# Patient Record
Sex: Female | Born: 1940 | Race: White | Hispanic: No | Smoking: Former smoker
Health system: Southern US, Community
[De-identification: ages and names within clinical notes are randomized; demographics above are authoritative.]

## PROBLEM LIST (undated history)

## (undated) DIAGNOSIS — Z923 Personal history of irradiation: Secondary | ICD-10-CM

## (undated) DIAGNOSIS — M1990 Unspecified osteoarthritis, unspecified site: Secondary | ICD-10-CM

## (undated) DIAGNOSIS — E785 Hyperlipidemia, unspecified: Secondary | ICD-10-CM

## (undated) DIAGNOSIS — K222 Esophageal obstruction: Secondary | ICD-10-CM

## (undated) DIAGNOSIS — L309 Dermatitis, unspecified: Secondary | ICD-10-CM

## (undated) DIAGNOSIS — R011 Cardiac murmur, unspecified: Secondary | ICD-10-CM

## (undated) DIAGNOSIS — N189 Chronic kidney disease, unspecified: Secondary | ICD-10-CM

## (undated) DIAGNOSIS — Z9889 Other specified postprocedural states: Secondary | ICD-10-CM

## (undated) DIAGNOSIS — J939 Pneumothorax, unspecified: Secondary | ICD-10-CM

## (undated) DIAGNOSIS — E039 Hypothyroidism, unspecified: Secondary | ICD-10-CM

## (undated) DIAGNOSIS — D649 Anemia, unspecified: Secondary | ICD-10-CM

## (undated) DIAGNOSIS — Z86718 Personal history of other venous thrombosis and embolism: Secondary | ICD-10-CM

## (undated) DIAGNOSIS — R51 Headache: Secondary | ICD-10-CM

## (undated) DIAGNOSIS — F329 Major depressive disorder, single episode, unspecified: Secondary | ICD-10-CM

## (undated) DIAGNOSIS — C439 Malignant melanoma of skin, unspecified: Secondary | ICD-10-CM

## (undated) DIAGNOSIS — Z9221 Personal history of antineoplastic chemotherapy: Secondary | ICD-10-CM

## (undated) DIAGNOSIS — Z973 Presence of spectacles and contact lenses: Secondary | ICD-10-CM

## (undated) DIAGNOSIS — R519 Headache, unspecified: Secondary | ICD-10-CM

## (undated) DIAGNOSIS — R112 Nausea with vomiting, unspecified: Secondary | ICD-10-CM

## (undated) DIAGNOSIS — K219 Gastro-esophageal reflux disease without esophagitis: Secondary | ICD-10-CM

## (undated) DIAGNOSIS — F32A Depression, unspecified: Secondary | ICD-10-CM

## (undated) DIAGNOSIS — Z853 Personal history of malignant neoplasm of breast: Secondary | ICD-10-CM

## (undated) DIAGNOSIS — I10 Essential (primary) hypertension: Secondary | ICD-10-CM

## (undated) HISTORY — DX: Personal history of malignant neoplasm of breast: Z85.3

## (undated) HISTORY — DX: Unspecified osteoarthritis, unspecified site: M19.90

## (undated) HISTORY — DX: Major depressive disorder, single episode, unspecified: F32.9

## (undated) HISTORY — DX: Hyperlipidemia, unspecified: E78.5

## (undated) HISTORY — PX: BREAST EXCISIONAL BIOPSY: SUR124

## (undated) HISTORY — DX: Gastro-esophageal reflux disease without esophagitis: K21.9

## (undated) HISTORY — DX: Malignant melanoma of skin, unspecified: C43.9

## (undated) HISTORY — DX: Personal history of other venous thrombosis and embolism: Z86.718

## (undated) HISTORY — DX: Dermatitis, unspecified: L30.9

## (undated) HISTORY — PX: COLONOSCOPY: SHX174

## (undated) HISTORY — DX: Hypothyroidism, unspecified: E03.9

## (undated) HISTORY — DX: Essential (primary) hypertension: I10

## (undated) HISTORY — DX: Depression, unspecified: F32.A

## (undated) HISTORY — DX: Esophageal obstruction: K22.2

---
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
## 1965-03-11 HISTORY — PX: APPENDECTOMY: SHX54

## 1975-03-12 HISTORY — PX: BREAST SURGERY: SHX581

## 1976-03-11 HISTORY — PX: LIPOMA EXCISION: SHX5283

## 1990-03-11 HISTORY — PX: TOTAL ABDOMINAL HYSTERECTOMY: SHX209

## 1994-08-06 ENCOUNTER — Encounter: Payer: Self-pay | Admitting: Internal Medicine

## 1996-03-11 HISTORY — PX: MASTECTOMY: SHX3

## 2002-03-11 HISTORY — PX: MELANOMA EXCISION: SHX5266

## 2003-02-14 ENCOUNTER — Other Ambulatory Visit: Admission: RE | Admit: 2003-02-14 | Discharge: 2003-02-14 | Payer: Self-pay | Admitting: Obstetrics and Gynecology

## 2003-03-12 HISTORY — PX: MOHS SURGERY: SUR867

## 2003-08-31 ENCOUNTER — Encounter: Admission: RE | Admit: 2003-08-31 | Discharge: 2003-08-31 | Payer: Self-pay | Admitting: Oncology

## 2004-01-11 ENCOUNTER — Ambulatory Visit: Payer: Self-pay | Admitting: Internal Medicine

## 2004-02-16 ENCOUNTER — Ambulatory Visit: Payer: Self-pay | Admitting: Internal Medicine

## 2004-02-23 ENCOUNTER — Ambulatory Visit: Payer: Self-pay | Admitting: Internal Medicine

## 2004-07-02 ENCOUNTER — Ambulatory Visit: Payer: Self-pay | Admitting: Oncology

## 2004-08-23 ENCOUNTER — Ambulatory Visit: Payer: Self-pay | Admitting: Internal Medicine

## 2004-08-30 ENCOUNTER — Ambulatory Visit: Payer: Self-pay | Admitting: Internal Medicine

## 2004-08-31 ENCOUNTER — Encounter: Admission: RE | Admit: 2004-08-31 | Discharge: 2004-08-31 | Payer: Self-pay | Admitting: Internal Medicine

## 2005-02-14 ENCOUNTER — Encounter: Admission: RE | Admit: 2005-02-14 | Discharge: 2005-02-14 | Payer: Self-pay | Admitting: Internal Medicine

## 2005-02-14 ENCOUNTER — Ambulatory Visit: Payer: Self-pay | Admitting: Internal Medicine

## 2005-04-25 ENCOUNTER — Other Ambulatory Visit: Admission: RE | Admit: 2005-04-25 | Discharge: 2005-04-25 | Payer: Self-pay | Admitting: Obstetrics and Gynecology

## 2005-08-09 ENCOUNTER — Ambulatory Visit: Payer: Self-pay | Admitting: Internal Medicine

## 2005-09-05 ENCOUNTER — Encounter: Admission: RE | Admit: 2005-09-05 | Discharge: 2005-09-05 | Payer: Self-pay | Admitting: Internal Medicine

## 2006-01-10 ENCOUNTER — Ambulatory Visit: Payer: Self-pay | Admitting: Internal Medicine

## 2006-02-06 ENCOUNTER — Ambulatory Visit: Payer: Self-pay | Admitting: Internal Medicine

## 2006-02-06 DIAGNOSIS — E785 Hyperlipidemia, unspecified: Secondary | ICD-10-CM | POA: Insufficient documentation

## 2006-02-06 DIAGNOSIS — E039 Hypothyroidism, unspecified: Secondary | ICD-10-CM

## 2006-02-06 DIAGNOSIS — F3289 Other specified depressive episodes: Secondary | ICD-10-CM | POA: Insufficient documentation

## 2006-02-06 DIAGNOSIS — I10 Essential (primary) hypertension: Secondary | ICD-10-CM

## 2006-02-06 DIAGNOSIS — Z853 Personal history of malignant neoplasm of breast: Secondary | ICD-10-CM

## 2006-02-06 DIAGNOSIS — F329 Major depressive disorder, single episode, unspecified: Secondary | ICD-10-CM

## 2006-02-06 DIAGNOSIS — J309 Allergic rhinitis, unspecified: Secondary | ICD-10-CM | POA: Insufficient documentation

## 2006-02-06 LAB — CONVERTED CEMR LAB
ALT: 19 units/L (ref 0–40)
AST: 23 units/L (ref 0–37)
Albumin: 4.5 g/dL (ref 3.5–5.2)
Alkaline Phosphatase: 77 units/L (ref 39–117)
BUN: 18 mg/dL (ref 6–23)
Bilirubin, Direct: 0.1 mg/dL (ref 0.0–0.3)
CO2: 32 meq/L (ref 19–32)
Calcium: 9.9 mg/dL (ref 8.4–10.5)
Chloride: 100 meq/L (ref 96–112)
Chol/HDL Ratio, serum: 3.8
Cholesterol, target level: 200 mg/dL
Cholesterol: 172 mg/dL (ref 0–200)
Creatinine, Ser: 0.9 mg/dL (ref 0.4–1.2)
GFR calc non Af Amer: 66 mL/min
Glomerular Filtration Rate, Af Am: 80 mL/min/{1.73_m2}
Glucose, Bld: 88 mg/dL (ref 70–99)
HDL goal, serum: 40 mg/dL
HDL: 45.8 mg/dL (ref >39.0)
LDL Cholesterol: 107 mg/dL — ABNORMAL HIGH (ref 0–99)
LDL Goal: 130 mg/dL
Potassium: 4.3 meq/L (ref 3.5–5.1)
Sodium: 139 meq/L (ref 135–145)
TSH: 3.57 microintl units/mL (ref 0.35–5.50)
Total Bilirubin: 0.8 mg/dL (ref 0.3–1.2)
Total Protein: 7.7 g/dL (ref 6.0–8.3)
Triglyceride fasting, serum: 97 mg/dL (ref 0–149)
VLDL: 19 mg/dL (ref 0–40)

## 2006-02-10 ENCOUNTER — Encounter: Payer: Self-pay | Admitting: Internal Medicine

## 2006-03-25 ENCOUNTER — Ambulatory Visit: Payer: Self-pay | Admitting: Internal Medicine

## 2006-07-01 ENCOUNTER — Ambulatory Visit: Payer: Self-pay | Admitting: Internal Medicine

## 2006-07-01 LAB — CONVERTED CEMR LAB
ALT: 20 units/L (ref 0–40)
AST: 27 units/L (ref 0–37)
Albumin: 4.2 g/dL (ref 3.5–5.2)
Alkaline Phosphatase: 67 units/L (ref 39–117)
Bilirubin, Direct: 0.1 mg/dL (ref 0.0–0.3)
TSH: 3.36 microintl units/mL (ref 0.35–5.50)
Total Bilirubin: 0.7 mg/dL (ref 0.3–1.2)
Total Protein: 7 g/dL (ref 6.0–8.3)

## 2006-08-27 ENCOUNTER — Encounter: Payer: Self-pay | Admitting: Internal Medicine

## 2006-08-27 ENCOUNTER — Encounter: Admission: RE | Admit: 2006-08-27 | Discharge: 2006-08-27 | Payer: Self-pay | Admitting: Internal Medicine

## 2006-08-28 ENCOUNTER — Ambulatory Visit: Payer: Self-pay | Admitting: Family Medicine

## 2006-08-28 ENCOUNTER — Encounter: Payer: Self-pay | Admitting: Internal Medicine

## 2006-09-04 ENCOUNTER — Encounter: Admission: RE | Admit: 2006-09-04 | Discharge: 2006-09-04 | Payer: Self-pay | Admitting: Obstetrics and Gynecology

## 2006-10-06 ENCOUNTER — Telehealth (INDEPENDENT_AMBULATORY_CARE_PROVIDER_SITE_OTHER): Payer: Self-pay | Admitting: *Deleted

## 2006-10-28 ENCOUNTER — Telehealth: Payer: Self-pay | Admitting: Internal Medicine

## 2006-12-30 ENCOUNTER — Ambulatory Visit: Payer: Self-pay | Admitting: Internal Medicine

## 2006-12-30 DIAGNOSIS — Z86718 Personal history of other venous thrombosis and embolism: Secondary | ICD-10-CM

## 2006-12-31 LAB — CONVERTED CEMR LAB
ALT: 16 units/L (ref 0–35)
AST: 27 units/L (ref 0–37)
Albumin: 4.3 g/dL (ref 3.5–5.2)
Alkaline Phosphatase: 68 units/L (ref 39–117)
BUN: 15 mg/dL (ref 6–23)
Basophils Absolute: 0 10*3/uL (ref 0.0–0.1)
Basophils Relative: 0.8 % (ref 0.0–1.0)
Bilirubin, Direct: 0.1 mg/dL (ref 0.0–0.3)
CO2: 34 meq/L — ABNORMAL HIGH (ref 19–32)
Calcium: 9.7 mg/dL (ref 8.4–10.5)
Chloride: 101 meq/L (ref 96–112)
Cholesterol: 189 mg/dL (ref 0–200)
Creatinine, Ser: 0.8 mg/dL (ref 0.4–1.2)
Eosinophils Absolute: 0.1 10*3/uL (ref 0.0–0.6)
Eosinophils Relative: 2.2 % (ref 0.0–5.0)
GFR calc Af Amer: 92 mL/min
GFR calc non Af Amer: 76 mL/min
Glucose, Bld: 73 mg/dL (ref 70–99)
HCT: 37.2 % (ref 36.0–46.0)
HDL: 37.3 mg/dL — ABNORMAL LOW (ref >39.0)
Hemoglobin: 13.3 g/dL (ref 12.0–15.0)
LDL Cholesterol: 117 mg/dL — ABNORMAL HIGH (ref 0–99)
Lymphocytes Relative: 24.6 % (ref 12.0–46.0)
MCHC: 35.7 g/dL (ref 30.0–36.0)
MCV: 86.8 fL (ref 78.0–100.0)
Monocytes Absolute: 0.5 10*3/uL (ref 0.2–0.7)
Monocytes Relative: 9.3 % (ref 3.0–11.0)
Neutro Abs: 3.2 10*3/uL (ref 1.4–7.7)
Neutrophils Relative %: 63.1 % (ref 43.0–77.0)
Platelets: 189 10*3/uL (ref 150–400)
Potassium: 4.4 meq/L (ref 3.5–5.1)
RBC: 4.28 M/uL (ref 3.87–5.11)
RDW: 12.5 % (ref 11.5–14.6)
Sodium: 139 meq/L (ref 135–145)
TSH: 3.61 microintl units/mL (ref 0.35–5.50)
Total Bilirubin: 0.8 mg/dL (ref 0.3–1.2)
Total CHOL/HDL Ratio: 5.1
Total Protein: 7.2 g/dL (ref 6.0–8.3)
Triglycerides: 176 mg/dL — ABNORMAL HIGH (ref 0–149)
VLDL: 35 mg/dL (ref 0–40)
WBC: 5.1 10*3/uL (ref 4.5–10.5)

## 2007-04-16 ENCOUNTER — Telehealth: Payer: Self-pay | Admitting: Internal Medicine

## 2007-06-25 ENCOUNTER — Ambulatory Visit: Payer: Self-pay | Admitting: Internal Medicine

## 2007-06-26 LAB — CONVERTED CEMR LAB
ALT: 20 units/L (ref 0–35)
AST: 29 units/L (ref 0–37)
Albumin: 4.5 g/dL (ref 3.5–5.2)
Alkaline Phosphatase: 64 units/L (ref 39–117)
Bilirubin, Direct: 0.1 mg/dL (ref 0.0–0.3)
CO2: 34 meq/L — ABNORMAL HIGH (ref 19–32)
Cholesterol: 190 mg/dL (ref 0–200)
Creatinine, Ser: 0.7 mg/dL (ref 0.4–1.2)
GFR calc Af Amer: 107 mL/min
GFR calc non Af Amer: 88 mL/min
Glucose, Bld: 88 mg/dL (ref 70–99)
LDL Cholesterol: 121 mg/dL — ABNORMAL HIGH (ref 0–99)
TSH: 2.71 microintl units/mL (ref 0.35–5.50)
Total Bilirubin: 1 mg/dL (ref 0.3–1.2)
Total CHOL/HDL Ratio: 4.6
Total Protein: 7.4 g/dL (ref 6.0–8.3)
VLDL: 28 mg/dL (ref 0–40)

## 2007-09-17 ENCOUNTER — Encounter: Admission: RE | Admit: 2007-09-17 | Discharge: 2007-09-17 | Payer: Self-pay | Admitting: Obstetrics and Gynecology

## 2007-11-02 ENCOUNTER — Telehealth: Payer: Self-pay | Admitting: Internal Medicine

## 2007-11-09 ENCOUNTER — Telehealth: Payer: Self-pay | Admitting: Internal Medicine

## 2007-11-24 ENCOUNTER — Ambulatory Visit: Payer: Self-pay | Admitting: Internal Medicine

## 2007-12-10 ENCOUNTER — Ambulatory Visit: Payer: Self-pay | Admitting: Internal Medicine

## 2007-12-14 LAB — CONVERTED CEMR LAB
ALT: 18 units/L (ref 0–35)
Alkaline Phosphatase: 65 units/L (ref 39–117)
Calcium: 9.8 mg/dL (ref 8.4–10.5)
Chloride: 100 meq/L (ref 96–112)
Creatinine, Ser: 0.8 mg/dL (ref 0.4–1.2)
HDL: 37.4 mg/dL — ABNORMAL LOW (ref >39.0)
Potassium: 4.7 meq/L (ref 3.5–5.1)
Total Bilirubin: 0.8 mg/dL (ref 0.3–1.2)
Total Protein: 7.5 g/dL (ref 6.0–8.3)
Triglycerides: 190 mg/dL — ABNORMAL HIGH (ref 0–149)

## 2007-12-31 ENCOUNTER — Telehealth: Payer: Self-pay | Admitting: Internal Medicine

## 2008-02-01 ENCOUNTER — Ambulatory Visit: Payer: Self-pay | Admitting: Internal Medicine

## 2008-02-01 LAB — CONVERTED CEMR LAB
ALT: 18 units/L (ref 0–35)
AST: 26 units/L (ref 0–37)
Alkaline Phosphatase: 71 units/L (ref 39–117)
Bilirubin, Direct: 0.1 mg/dL (ref 0.0–0.3)
Cholesterol: 241 mg/dL (ref 0–200)
Total Bilirubin: 1 mg/dL (ref 0.3–1.2)
Total Protein: 7.7 g/dL (ref 6.0–8.3)
VLDL: 36 mg/dL (ref 0–40)

## 2008-02-10 ENCOUNTER — Ambulatory Visit: Payer: Self-pay | Admitting: Internal Medicine

## 2008-09-08 ENCOUNTER — Ambulatory Visit: Payer: Self-pay | Admitting: Internal Medicine

## 2008-09-08 LAB — CONVERTED CEMR LAB
ALT: 20 units/L (ref 0–35)
AST: 26 units/L (ref 0–37)
Albumin: 4.1 g/dL (ref 3.5–5.2)
Alkaline Phosphatase: 65 units/L (ref 39–117)
Bilirubin, Direct: 0 mg/dL (ref 0.0–0.3)
Direct LDL: 147.8 mg/dL
TSH: 5.6 microintl units/mL — ABNORMAL HIGH (ref 0.35–5.50)
Total Bilirubin: 1 mg/dL (ref 0.3–1.2)
Total Protein: 7.5 g/dL (ref 6.0–8.3)
VLDL: 28.6 mg/dL (ref 0.0–40.0)

## 2008-09-15 ENCOUNTER — Ambulatory Visit: Payer: Self-pay | Admitting: Internal Medicine

## 2008-09-26 ENCOUNTER — Encounter: Payer: Self-pay | Admitting: Internal Medicine

## 2008-09-26 ENCOUNTER — Ambulatory Visit: Payer: Self-pay | Admitting: Internal Medicine

## 2008-10-19 ENCOUNTER — Ambulatory Visit: Payer: Self-pay | Admitting: Internal Medicine

## 2008-10-20 ENCOUNTER — Encounter: Admission: RE | Admit: 2008-10-20 | Discharge: 2008-10-20 | Payer: Self-pay | Admitting: Internal Medicine

## 2008-10-25 ENCOUNTER — Telehealth: Payer: Self-pay | Admitting: Internal Medicine

## 2008-10-31 ENCOUNTER — Ambulatory Visit: Payer: Self-pay | Admitting: Internal Medicine

## 2008-11-01 LAB — CONVERTED CEMR LAB: IgE (Immunoglobulin E), Serum: <1.5 intl units/mL (ref 0.0–180.0)

## 2008-11-09 ENCOUNTER — Ambulatory Visit: Payer: Self-pay | Admitting: Internal Medicine

## 2008-12-21 ENCOUNTER — Telehealth: Payer: Self-pay | Admitting: Internal Medicine

## 2008-12-21 ENCOUNTER — Ambulatory Visit: Payer: Self-pay | Admitting: Internal Medicine

## 2009-03-06 ENCOUNTER — Encounter: Payer: Self-pay | Admitting: Internal Medicine

## 2009-03-14 ENCOUNTER — Encounter: Payer: Self-pay | Admitting: Family Medicine

## 2009-03-20 ENCOUNTER — Ambulatory Visit: Payer: Self-pay | Admitting: Internal Medicine

## 2009-03-23 LAB — CONVERTED CEMR LAB
Alkaline Phosphatase: 68 units/L (ref 39–117)
BUN: 17 mg/dL (ref 6–23)
Cholesterol: 198 mg/dL (ref 0–200)
Creatinine, Ser: 0.9 mg/dL (ref 0.4–1.2)
GFR calc non Af Amer: 65.72 mL/min (ref >60)
HDL: 41.1 mg/dL (ref >39.00)
LDL Cholesterol: 122 mg/dL — ABNORMAL HIGH (ref 0–99)
TSH: 0.97 microintl units/mL (ref 0.35–5.50)
Triglycerides: 177 mg/dL — ABNORMAL HIGH (ref 0.0–149.0)
VLDL: 35.4 mg/dL (ref 0.0–40.0)

## 2009-07-24 ENCOUNTER — Encounter: Payer: Self-pay | Admitting: Internal Medicine

## 2009-07-26 ENCOUNTER — Encounter: Payer: Self-pay | Admitting: Internal Medicine

## 2009-07-27 ENCOUNTER — Encounter: Payer: Self-pay | Admitting: Internal Medicine

## 2009-09-18 ENCOUNTER — Ambulatory Visit: Payer: Self-pay | Admitting: Internal Medicine

## 2009-09-19 LAB — CONVERTED CEMR LAB
ALT: 17 units/L (ref 0–35)
AST: 26 units/L (ref 0–37)
Albumin: 4.5 g/dL (ref 3.5–5.2)
Alkaline Phosphatase: 74 units/L (ref 39–117)
BUN: 20 mg/dL (ref 6–23)
Basophils Absolute: 0 10*3/uL (ref 0.0–0.1)
Bilirubin, Direct: 0.1 mg/dL (ref 0.0–0.3)
CO2: 33 meq/L — ABNORMAL HIGH (ref 19–32)
Chloride: 103 meq/L (ref 96–112)
Cholesterol: 226 mg/dL — ABNORMAL HIGH (ref 0–200)
Creatinine, Ser: 0.8 mg/dL (ref 0.4–1.2)
Eosinophils Absolute: 0.1 10*3/uL (ref 0.0–0.7)
Eosinophils Relative: 2.6 % (ref 0.0–5.0)
GFR calc non Af Amer: 74.11 mL/min (ref >60)
HCT: 37.4 % (ref 36.0–46.0)
Hemoglobin: 13.1 g/dL (ref 12.0–15.0)
Lymphocytes Relative: 27.3 % (ref 12.0–46.0)
Lymphs Abs: 1.4 10*3/uL (ref 0.7–4.0)
Monocytes Absolute: 0.4 10*3/uL (ref 0.1–1.0)
Potassium: 4.8 meq/L (ref 3.5–5.1)
RBC: 4.2 M/uL (ref 3.87–5.11)
Total CHOL/HDL Ratio: 5
Total Protein: 7.8 g/dL (ref 6.0–8.3)

## 2009-10-09 ENCOUNTER — Encounter: Admission: RE | Admit: 2009-10-09 | Discharge: 2009-10-30 | Payer: Self-pay | Admitting: Internal Medicine

## 2009-10-25 ENCOUNTER — Encounter: Payer: Self-pay | Admitting: Internal Medicine

## 2009-10-26 ENCOUNTER — Encounter: Admission: RE | Admit: 2009-10-26 | Discharge: 2009-10-26 | Payer: Self-pay | Admitting: Obstetrics and Gynecology

## 2009-11-03 ENCOUNTER — Encounter: Payer: Self-pay | Admitting: Internal Medicine

## 2009-12-26 ENCOUNTER — Telehealth: Payer: Self-pay | Admitting: *Deleted

## 2010-01-16 ENCOUNTER — Ambulatory Visit: Payer: Self-pay | Admitting: Internal Medicine

## 2010-03-19 ENCOUNTER — Encounter: Payer: Self-pay | Admitting: Internal Medicine

## 2010-03-19 ENCOUNTER — Ambulatory Visit
Admission: RE | Admit: 2010-03-19 | Discharge: 2010-03-19 | Payer: Self-pay | Source: Home / Self Care | Attending: Internal Medicine | Admitting: Internal Medicine

## 2010-03-19 ENCOUNTER — Other Ambulatory Visit: Payer: Self-pay | Admitting: Internal Medicine

## 2010-03-19 LAB — HEPATIC FUNCTION PANEL
ALT: 17 U/L (ref 0–35)
AST: 24 U/L (ref 0–37)
Albumin: 4.2 g/dL (ref 3.5–5.2)
Alkaline Phosphatase: 73 U/L (ref 39–117)
Bilirubin, Direct: 0.1 mg/dL (ref 0.0–0.3)
Total Bilirubin: 0.7 mg/dL (ref 0.3–1.2)
Total Protein: 7.4 g/dL (ref 6.0–8.3)

## 2010-03-19 LAB — BASIC METABOLIC PANEL
BUN: 19 mg/dL (ref 6–23)
CO2: 29 mEq/L (ref 19–32)
Calcium: 10.2 mg/dL (ref 8.4–10.5)
Chloride: 104 mEq/L (ref 96–112)
Creatinine, Ser: 0.8 mg/dL (ref 0.4–1.2)
GFR: 77.3 mL/min (ref >60.00)
Glucose, Bld: 76 mg/dL (ref 70–99)
Potassium: 4.7 mEq/L (ref 3.5–5.1)
Sodium: 141 mEq/L (ref 135–145)

## 2010-03-19 LAB — LIPID PANEL
Cholesterol: 219 mg/dL — ABNORMAL HIGH (ref 0–200)
HDL: 43.9 mg/dL (ref >39.00)
Total CHOL/HDL Ratio: 5
Triglycerides: 223 mg/dL — ABNORMAL HIGH (ref 0.0–149.0)
VLDL: 44.6 mg/dL — ABNORMAL HIGH (ref 0.0–40.0)

## 2010-03-19 LAB — CBC WITH DIFFERENTIAL/PLATELET
Basophils Absolute: 0 10*3/uL (ref 0.0–0.1)
Basophils Relative: 0.6 % (ref 0.0–3.0)
Eosinophils Absolute: 0.1 10*3/uL (ref 0.0–0.7)
Eosinophils Relative: 2 % (ref 0.0–5.0)
HCT: 36.8 % (ref 36.0–46.0)
Hemoglobin: 12.8 g/dL (ref 12.0–15.0)
Lymphocytes Relative: 26.1 % (ref 12.0–46.0)
Lymphs Abs: 1.3 10*3/uL (ref 0.7–4.0)
MCHC: 34.8 g/dL (ref 30.0–36.0)
MCV: 88.9 fl (ref 78.0–100.0)
Monocytes Absolute: 0.4 10*3/uL (ref 0.1–1.0)
Monocytes Relative: 8.1 % (ref 3.0–12.0)
Neutro Abs: 3.1 10*3/uL (ref 1.4–7.7)
Neutrophils Relative %: 63.2 % (ref 43.0–77.0)
Platelets: 195 10*3/uL (ref 150.0–400.0)
RBC: 4.13 Mil/uL (ref 3.87–5.11)
RDW: 13.2 % (ref 11.5–14.6)
WBC: 4.9 10*3/uL (ref 4.5–10.5)

## 2010-03-19 LAB — TSH: TSH: 0.96 u[IU]/mL (ref 0.35–5.50)

## 2010-03-19 LAB — LDL CHOLESTEROL, DIRECT: Direct LDL: 144.6 mg/dL

## 2010-03-29 ENCOUNTER — Telehealth: Payer: Self-pay | Admitting: Internal Medicine

## 2010-04-10 NOTE — Miscellaneous (Signed)
Summary: Discharge Summary for PT [facility]  Discharge Summary for PT Services/[facility]   Imported By: Maryln Gottron 01/24/2010 12:52:38  _____________________________________________________________________  External Attachment:    Type:   Image     Comment:   External Document

## 2010-04-10 NOTE — Letter (Signed)
Summary: [facility]   Imported By: Maryln Gottron 09/19/2009 14:58:37  _____________________________________________________________________  External Attachment:    Type:   Image     Comment:   External Document

## 2010-04-10 NOTE — Letter (Signed)
Summary: [facility]   Imported By: Maryln Gottron 09/19/2009 15:01:55  _____________________________________________________________________  External Attachment:    Type:   Image     Comment:   External Document

## 2010-04-10 NOTE — Letter (Signed)
Summary: [facility]   Imported By: Maryln Gottron 09/19/2009 15:00:06  _____________________________________________________________________  External Attachment:    Type:   Image     Comment:   External Document

## 2010-04-10 NOTE — Assessment & Plan Note (Signed)
Summary: 6 MONTH ROV/NJR   Vital Signs:  Patient profile:   70 year old female Weight:      147 pounds Temp:     97.9 degrees F Pulse rate:   72 / minute Resp:     12 per minute BP sitting:   112 / 52  (left arm)  Vitals Entered By: Gladis Riffle, RN (March 20, 2009 10:15 AM)   Primary Care Provider:  Tryton Bodi   History of Present Illness:  Follow-Up Visit      This is a 70 year old woman who presents for Follow-up visit.  The patient denies chest pain, palpitations, dizziness, syncope, SOB, DOE, PND, and orthopnea.  Since the last visit the patient notes no new problems or concerns.  The patient reports taking meds as prescribed.  When questioned about possible medication side effects, the patient notes none.    All other systems reviewed and were negative -- has had minimal episodes of vertigo---sees ENT she is going to resume exercise.   Preventive Screening-Counseling & Management  Alcohol-Tobacco     Smoking Status: quit  Current Problems (verified): 1)  ? of Allergy, Food  (ICD-693.1) 2)  Special Screening For Malignant Neoplasms Colon  (ICD-V76.51) 3)  Dvt, Hx of  (ICD-V12.51) 4)  Allergic Rhinitis  (ICD-477.9) 5)  Breast Cancer, Hx of  (ICD-V10.3) 6)  Hypothyroidism  (ICD-244.9) 7)  Hypertension  (ICD-401.9) 8)  Hyperlipidemia  (ICD-272.4) 9)  Depression  (ICD-311) 10)  Osteoporosis  (ICD-733.00)  Current Medications (verified): 1)  Atrovent 0.03 % Soln (Ipratropium Bromide) .... Spray 1 Spray Into Both Nostrils Twice A  Day 2)  Effexor Xr 37.5 Mg  Cp24 (Venlafaxine Hcl) .[name].. 1 By Mouth Once Daily 3)  Nadolol 20 Mg  Tabs (Nadolol) .... 1/2 Daily 4)  Nexium 40 Mg Cpdr (Esomeprazole Magnesium) .... Take 1 Capsule By Mouth Once A Day 5)  Levothyroxine Sodium 75 Mcg  Tabs (Levothyroxine Sodium) .[name].. 1 By Mouth Daily 6)  Caltrate 600+d 600-400 Mg-Unit Tabs (Calcium Carbonate-Vitamin D) .... Two Times A Day 7)  Stress Formula  Tabs (B Complex-C-Folic Acid) .... Once  Daily 8)  Lipitor 10 Mg Tabs (Atorvastatin Calcium) .... Take One Tablet Every Other Bedtime 9)  Ibuprofen 200 Mg Tabs (Ibuprofen) .... Two Once Daily 10)  Claritin 10 Mg Tabs (Loratadine) .... Once Daily  Allergies: 1)  ! * Flu Vaccination 2)  ! Pcn 3)  ! * Cat Gut 4)  ! Niacin 5)  ! * Eggs  Comments:  Nurse/Medical Assistant: 6 month rov, pt given all RX for a year--c/o vertigo relieved by claritin  The patient's medications and allergies were reviewed with the patient and were updated in the Medication and Allergy Lists. Gladis Riffle, RN (March 20, 2009 10:19 AM)  Past History:  Past Medical History: Last updated: 10/19/2008 vertigo L ear hearing loss tinnitis Osteoporosis dvt after child birth Depression Hyperlipidemia Hypertension Hypothyroidism Breast cancer, hx of Melanoma DVT, hx of Hx of Eczema Hx of Allergic Rhinitis- skin test 1996 scanned  Past Surgical History: Last updated: 10/19/2008 Appendectomy Mastectomy L eye vitreous tear Facial melanoma Basal cell carcinoma Total Abdominal Hysterectomy Tonsillectomy  Family History: Last updated: 10/19/2008 Father- heart disease Mother- Cancer  Social History: Last updated: 10/19/2008 Retired Widowed, children Former Smoker- 1 ppd x 5 years Alcohol use-yes  Risk Factors: Smoking Status: quit (03/20/2009)  Physical Exam  General:  Well-developed,well-nourished,in no acute distress; alert,appropriate and cooperative throughout examination Head:  normocephalic and atraumatic.  Eyes:  pupils equal and pupils round.   Ears:  R ear normal and L ear normal.   Nose:  no external deformity and no external erythema.   Neck:  No deformities, masses, or tenderness noted. Lungs:  normal respiratory effort and no intercostal retractions.   Heart:  normal rate and regular rhythm.   Abdomen:  Bowel sounds positive,abdomen soft and non-tender without masses, organomegaly or hernias noted. Msk:  No  deformity or scoliosis noted of thoracic or lumbar spine.   Pulses:  R radial normal and L radial normal.   Neurologic:  cranial nerves II-XII intact and gait normal.     Impression & Recommendations:  Problem # 1:  BREAST CANCER, HX OF (ICD-V10.3) no recurrence  Problem # 2:  HYPOTHYROIDISM (ICD-244.9) tolerating meds Her updated medication list for this problem includes:    Levothyroxine Sodium 75 Mcg Tabs (Levothyroxine sodium) .[name]... 1 by mouth daily  Labs Reviewed: TSH: 5.60 (09/08/2008)    Chol: 213 (09/08/2008)   HDL: 42.30 (09/08/2008)   LDL: DEL (02/01/2008)   TG: 143.0 (09/08/2008)  Orders: Venipuncture (16109) TLB-TSH (Thyroid Stimulating Hormone) (84443-TSH)  Problem # 3:  HYPERTENSION (ICD-401.9) controlled Her updated medication list for this problem includes:    Nadolol 20 Mg Tabs (Nadolol) .[name]... 1/2 daily  BP today: 112/52 Prior BP: 120/80 (10/19/2008)  Prior 10 Yr Risk Heart Disease: 15 % (02/10/2006)  Labs Reviewed: K+: 4.7 (12/10/2007) Creat: : 0.8 (12/10/2007)   Chol: 213 (09/08/2008)   HDL: 42.30 (09/08/2008)   LDL: DEL (02/01/2008)   TG: 143.0 (09/08/2008)  Orders: Venipuncture (60454) TLB-BMP (Basic Metabolic Panel-BMET) (80048-METABOL)  Problem # 4:  HYPERLIPIDEMIA (ICD-272.4)  previously controlled Her updated medication list for this problem includes:    Lipitor 10 Mg Tabs (Atorvastatin calcium) .[name]... Take one tablet every other bedtime  Labs Reviewed: SGOT: 26 (09/08/2008)   SGPT: 20 (09/08/2008)  Lipid Goals: Chol Goal: 200 (02/06/2006)   HDL Goal: 40 (02/06/2006)   LDL Goal: 130 (02/06/2006)   TG Goal: 150 (02/06/2006)  Prior 10 Yr Risk Heart Disease: 15 % (02/10/2006)   HDL:42.30 (09/08/2008), 43.2 (02/01/2008)  LDL:DEL (02/01/2008), DEL (12/10/2007)  Chol:213 (09/08/2008), 241 (02/01/2008)  Trig:143.0 (09/08/2008), 181 (02/01/2008) reviewed dexa scan will ask for previous scans and make a decision about treatment  Complete  Medication List: 1)  Atrovent 0.03 % Soln (Ipratropium bromide) .... Spray 1 spray into both nostrils twice a  day 2)  Effexor Xr 37.5 Mg Cp24 (Venlafaxine hcl) .[name].. 1 by mouth once daily 3)  Nadolol 20 Mg Tabs (Nadolol) .... 1/2 daily 4)  Nexium 40 Mg Cpdr (Esomeprazole magnesium) .... Take 1 capsule by mouth once a day 5)  Levothyroxine Sodium 75 Mcg Tabs (Levothyroxine sodium) .[name].. 1 by mouth daily 6)  Caltrate 600+d 600-400 Mg-unit Tabs (Calcium carbonate-vitamin d) .... Two times a day 7)  Stress Formula Tabs (B complex-c-folic acid) .... Once daily 8)  Lipitor 10 Mg Tabs (Atorvastatin calcium) .... Take one tablet every other bedtime 9)  Ibuprofen 200 Mg Tabs (Ibuprofen) .... Two once daily 10)  Claritin 10 Mg Tabs (Loratadine) .... Once daily  Other Orders: TLB-Lipid Panel (80061-LIPID) TLB-Hepatic/Liver Function Pnl (80076-HEPATIC) Prescriptions: LIPITOR 10 MG TABS (ATORVASTATIN CALCIUM) Take one tablet every other bedtime  #45 x 3   Entered by:   Gladis Riffle, RN   Authorized by:   Birdie Sons MD   Signed by:   Gladis Riffle, RN on 03/20/2009   Method used:   Print then  Give to Patient   RxID:   [id number] EFFEXOR XR 37.5 MG  CP24 (VENLAFAXINE HCL) 1 by mouth once daily  #90 x 3   Entered by:   Gladis Riffle, RN   Authorized by:   Birdie Sons MD   Signed by:   Gladis Riffle, RN on 03/20/2009   Method used:   Print then Give to Patient   RxID:   [id number] ATROVENT 0.03 % SOLN (IPRATROPIUM BROMIDE) Spray 1 spray into both nostrils twice a  day  #3 x 3   Entered by:   Gladis Riffle, RN   Authorized by:   Birdie Sons MD   Signed by:   Gladis Riffle, RN on 03/20/2009   Method used:   Print then Give to Patient   RxID:   [id number] NEXIUM 40 MG CPDR (ESOMEPRAZOLE MAGNESIUM) Take 1 capsule by mouth once a day  #90 x 3   Entered by:   Gladis Riffle, RN   Authorized by:   Birdie Sons MD   Signed by:   Gladis Riffle, RN on 03/20/2009   Method used:   Print then Give to Patient    RxID:   [id number] LEVOTHYROXINE SODIUM 75 MCG  TABS (LEVOTHYROXINE SODIUM) 1 by mouth daily  #90 x 3   Entered by:   Gladis Riffle, RN   Authorized by:   Birdie Sons MD   Signed by:   Gladis Riffle, RN on 03/20/2009   Method used:   Print then Give to Patient   RxID:   [id number] NADOLOL 20 MG  TABS (NADOLOL) 1/2 daily  #45 x 3   Entered by:   Gladis Riffle, RN   Authorized by:   Birdie Sons MD   Signed by:   Gladis Riffle, RN on 03/20/2009   Method used:   Print then Give to Patient   RxID:   [id number]

## 2010-04-10 NOTE — Medication Information (Signed)
Summary: Order for Mastectomy Supplies/[facility]   Order for Mastectomy Supplies/[facility]   Imported By: Maryln Gottron 03/15/2009 14:45:38  _____________________________________________________________________  External Attachment:    Type:   Image     Comment:   External Document

## 2010-04-10 NOTE — Assessment & Plan Note (Signed)
Summary: 6 month rov/njr   Vital Signs:  Patient profile:   70 year old female Height:      66.5 inches (168.91 cm) Weight:      146 pounds (66.36 kg) BMI:     23.30 Temp:     98.2 degrees F (36.78 degrees C) oral Pulse rate:   72 / minute BP sitting:   118 / 72  (left arm) Cuff size:   regular  Vitals Entered By: Josph Macho RMA (September 18, 2009 9:45 AM) CC: 6 month follow up/ CF Is Patient Diabetic? No   Primary Care Provider:  Biance Moncrief  CC:  6 month follow up/ CF.  History of Present Illness:  Follow-Up Visit      This is a 70 year old woman who presents for Follow-up visit.  The patient denies chest pain and palpitations.  Since the last visit the patient notes no new problems or concerns.  The patient reports taking meds as prescribed.  When questioned about possible medication side effects, the patient notes none.   she has seen a derrmatologist about contact dermatologist---she states allergic to lanolin---reviewed note of 07/24/09  Vertigo---intermittent sxs. has seen ENT in the past. she describes several year hx of intermittent room spinning. Typically exacerbated by lying back or moving head quickly.   she describes frontal sinus pressure---she thinks related to sinus/allergies. She describes chronic rhinorrhea/PNDr---better with atrovent.  All other systems reviewed and were negative   Current Problems (verified): 1)  ? of Allergy, Food  (ICD-693.1) 2)  Dvt, Hx of  (ICD-V12.51) 3)  Allergic Rhinitis  (ICD-477.9) 4)  Breast Cancer, Hx of  (ICD-V10.3) 5)  Hypothyroidism  (ICD-244.9) 6)  Hypertension  (ICD-401.9) 7)  Hyperlipidemia  (ICD-272.4) 8)  Depression  (ICD-311) 9)  Osteoporosis  (ICD-733.00)  Current Medications (verified): 1)  Atrovent 0.03 % Soln (Ipratropium Bromide) .... Spray 1 Spray Into Both Nostrils Twice A  Day 2)  Effexor Xr 37.5 Mg  Cp24 (Venlafaxine Hcl) .[name].. 1 By Mouth Once Daily 3)  Nadolol 20 Mg  Tabs (Nadolol) .... 1/2 Daily 4)  Nexium  40 Mg Cpdr (Esomeprazole Magnesium) .... Take 1 Capsule By Mouth Once A Day 5)  Levothyroxine Sodium 75 Mcg  Tabs (Levothyroxine Sodium) .[name].. 1 By Mouth Daily 6)  Caltrate 600+d 600-400 Mg-Unit Tabs (Calcium Carbonate-Vitamin D) .... Two Times A Day 7)  Stress Formula  Tabs (B Complex-C-Folic Acid) .... Once Daily 8)  Lipitor 10 Mg Tabs (Atorvastatin Calcium) .... Take One Tablet Every Other Bedtime 9)  Ibuprofen 200 Mg Tabs (Ibuprofen) .... Two Once Daily  Allergies (verified): 1)  ! * Flu Vaccination 2)  ! Pcn 3)  ! * Cat Gut 4)  ! Niacin 5)  ! * Eggs  Past History:  Past Medical History: Last updated: 10/19/2008 vertigo L ear hearing loss tinnitis Osteoporosis dvt after child birth Depression Hyperlipidemia Hypertension Hypothyroidism Breast cancer, hx of Melanoma DVT, hx of Hx of Eczema Hx of Allergic Rhinitis- skin test 1996 scanned  Past Surgical History: Last updated: 10/19/2008 Appendectomy Mastectomy L eye vitreous tear Facial melanoma Basal cell carcinoma Total Abdominal Hysterectomy Tonsillectomy  Family History: Last updated: 10/19/2008 Father- heart disease Mother- Cancer  Social History: Last updated: 10/19/2008 Retired Widowed, children Former Smoker- 1 ppd x 5 years Alcohol use-yes  Risk Factors: Smoking Status: quit (03/20/2009)  Physical Exam  General:  alert and well-developed.   Head:  normocephalic and atraumatic.   Eyes:  pupils equal and pupils round.  Ears:  R ear normal and L ear normal.   Nose:  no external deformity and no external erythema.   Neck:  No deformities, masses, or tenderness noted. Chest Wall:  No deformities, masses, or tenderness noted. Lungs:  normal respiratory effort and no intercostal retractions.   Heart:  normal rate and regular rhythm.   Abdomen:  Bowel sounds positive,abdomen soft and non-tender without masses, organomegaly or hernias noted. Msk:  No deformity or scoliosis noted of thoracic or  lumbar spine.   Pulses:  R radial normal and L radial normal.   Neurologic:  cranial nerves II-XII intact and gait normal.   Skin:  turgor normal and color normal.   Cervical Nodes:  no anterior cervical adenopathy and no posterior cervical adenopathy.   Psych:  normally interactive and good eye contact.     Impression & Recommendations:  Problem # 1:  HYPOTHYROIDISM (ICD-244.9)  previously controlled continue current medications  Her updated medication list for this problem includes:    Levothyroxine Sodium 75 Mcg Tabs (Levothyroxine sodium) .[name]... 1 by mouth daily  Labs Reviewed: TSH: 0.97 (03/20/2009)    Chol: 198 (03/20/2009)   HDL: 41.10 (03/20/2009)   LDL: 122 (03/20/2009)   TG: 177.0 (03/20/2009)  Orders: TLB-TSH (Thyroid Stimulating Hormone) (84443-TSH)  Problem # 2:  HYPERTENSION (ICD-401.9)  controlled continue current medications  Her updated medication list for this problem includes:    Nadolol 20 Mg Tabs (Nadolol) .[name]... 1/2 daily  BP today: 118/72 Prior BP: 112/52 (03/20/2009)  Prior 10 Yr Risk Heart Disease: 15 % (02/10/2006)  Labs Reviewed: K+: 4.4 (03/20/2009) Creat: : 0.9 (03/20/2009)   Chol: 198 (03/20/2009)   HDL: 41.10 (03/20/2009)   LDL: 122 (03/20/2009)   TG: 177.0 (03/20/2009)  Orders: Venipuncture (16109) TLB-BMP (Basic Metabolic Panel-BMET) (80048-METABOL)  Problem # 3:  DEPRESSION (ICD-311) doing well continue current medications  Her updated medication list for this problem includes:    Effexor Xr 37.5 Mg Cp24 (Venlafaxine hcl) .[name]... 1 by mouth once daily  Problem # 4:  BREAST CANCER, HX OF (ICD-V10.3)  no known recurrence  Orders: TLB-CBC Platelet - w/Differential (85025-CBCD)  Problem # 5:  VERTIGO (ICD-780.4)  discussed at length---refer vestibular The following medications were removed from the medication list:    Claritin 10 Mg Tabs (Loratadine) ..... Once daily  Orders: Rehabilitation Referral (Rehab)  Complete  Medication List: 1)  Atrovent 0.03 % Soln (Ipratropium bromide) .... Spray 1 spray into both nostrils twice a  day 2)  Effexor Xr 37.5 Mg Cp24 (Venlafaxine hcl) .[name].. 1 by mouth once daily 3)  Nadolol 20 Mg Tabs (Nadolol) .... 1/2 daily 4)  Nexium 40 Mg Cpdr (Esomeprazole magnesium) .... Take 1 capsule by mouth once a day 5)  Levothyroxine Sodium 75 Mcg Tabs (Levothyroxine sodium) .[name].. 1 by mouth daily 6)  Caltrate 600+d 600-400 Mg-unit Tabs (Calcium carbonate-vitamin d) .... Two times a day 7)  Stress Formula Tabs (B complex-c-folic acid) .... Once daily 8)  Lipitor 10 Mg Tabs (Atorvastatin calcium) .... Take one tablet every other bedtime 9)  Ibuprofen 200 Mg Tabs (Ibuprofen) .... Two once daily 10)  Nasonex 50 Mcg/act Susp (Mometasone furoate) .[name].. 1-2 each nostril once daily  Other Orders: TLB-Lipid Panel (80061-LIPID) TLB-Hepatic/Liver Function Pnl (80076-HEPATIC)  Patient Instructions: 1)  Please schedule a follow-up appointment in 6 months.

## 2010-04-10 NOTE — Assessment & Plan Note (Signed)
Summary: flu shot//alp  Nurse Visit   Allergies: 1)  ! * Flu Vaccination 2)  ! Pcn 3)  ! * Cat Gut 4)  ! Niacin 5)  ! * Eggs  Orders Added: 1)  Flu Vaccine 14yrs + MEDICARE PATIENTS [Q2039] 2)  Administration Flu vaccine - MCR [G0008] Flu Vaccine Consent Questions     Do you have a history of severe allergic reactions to this vaccine? no    Any prior history of allergic reactions to egg and/or gelatin? no    Do you have a sensitivity to the preservative Thimersol? no    Do you have a past history of Guillan-Barre Syndrome? no    Do you currently have an acute febrile illness? no    Have you ever had a severe reaction to latex? no    Vaccine information given and explained to patient? yes    Are you currently pregnant? no    Lot Number:[id number]   Exp Date:09/08/2010   Site Given  Left Deltoid IM [email or web address]

## 2010-04-10 NOTE — Progress Notes (Signed)
Summary: Pt req med to be called in for sore throat,cough,runny nose  Phone Note Call from Patient Call back at Home Phone [phone] Call back at [phone] cell   Caller: Patient Summary of Call: Pt called and said that she has sore throat,runny nose, non-productive cough, headaches. Pt has been taking allegra otc for 2 weeks for verigo symptoms and fullness in ears. Pt says she is feeling worse. Req Dr Cato Mulligan call in a  med to [facility] on [address]. Pt wants to know if she should continue the Allegra or not? Pt still has some of the Hydomet syrup and wants to know if that would still be ok to take? Pls call. Initial call taken by: Lucy Antigua,  December 26, 2009 8:52 AM  Follow-up for Phone Call        discontinue Allegra. Start Mucinex DM one p.o. b.i.d. It's okay for her to use the cough syrup as needed. Follow-up by: Birdie Sons MD,  December 26, 2009 3:20 PM  Additional Follow-up for Phone Call Additional follow up Details #1::        Pt called back and has been informed with the instructions noted above.  Additional Follow-up by: Lucy Antigua,  December 26, 2009 4:08 PM

## 2010-04-10 NOTE — Miscellaneous (Signed)
Summary: Initial Summary for PT Services/[facility]  Initial Summary for PT Services/[facility]   Imported By: Maryln Gottron 11/01/2009 15:19:51  _____________________________________________________________________  External Attachment:    Type:   Image     Comment:   External Document

## 2010-04-12 NOTE — Progress Notes (Signed)
Summary: REQUEST FOR RETURN CALL  Phone Note Call from Patient   Caller: Patient   [phone] Summary of Call: Pt wants to speak with Dr / Nurse in ref to recent labwork.... Pt can be reached at [phone].   Initial call taken by: Debbra Riding,  March 29, 2010 11:40 AM  Follow-up for Phone Call        pt is feeling better, no diarrhea and wants to know if she can resume with dairy products and if she can does she do it gradually? Follow-up by: Alfred Levins, CMA,  March 29, 2010 12:31 PM  Additional Follow-up for Phone Call Additional follow up Details #1::        she can try i would adviseOTC lactaid prior to dairy products per package instructions Additional Follow-up by: Birdie Sons MD,  March 30, 2010 6:54 AM    Additional Follow-up for Phone Call Additional follow up Details #2::    pt aware Follow-up by: Alfred Levins, CMA,  March 30, 2010 5:19 PM

## 2010-04-12 NOTE — Assessment & Plan Note (Signed)
Summary: 6 month rov/nrj   Vital Signs:  Patient profile:   70 year old female Weight:      145 pounds Temp:     97.8 degrees F oral Pulse rate:   72 / minute Pulse rhythm:   regular BP sitting:   132 / 74  (left arm) Cuff size:   regular  Vitals Entered By: Alfred Levins, CMA (March 19, 2010 9:58 AM) CC: renew meds   Primary Care Provider:  Advith Martine  CC:  renew meds.  History of Present Illness:  Follow-Up Visit      This is a 70 year old woman who presents for Follow-up visit.  The patient denies chest pain and palpitations.  Since the last visit the patient notes no new problems or concern except has developed diarrhea---she thinks related t to food allergy, she will have diarrhea from daily to every 3-4 days.  The patient reports taking meds as prescribed.  When questioned about possible medication side effects, the patient notes none.   when she has the diarrhea she describes one loose BM.  All other systems reviewed and were negative   Current Problems (verified): 1)  Vertigo  (ICD-780.4) 2)  ? of Allergy, Food  (ICD-693.1) 3)  Dvt, Hx of  (ICD-V12.51) 4)  Allergic Rhinitis  (ICD-477.9) 5)  Breast Cancer, Hx of  (ICD-V10.3) 6)  Hypothyroidism  (ICD-244.9) 7)  Hypertension  (ICD-401.9) 8)  Hyperlipidemia  (ICD-272.4) 9)  Depression  (ICD-311) 10)  Osteoporosis  (ICD-733.00)  Current Medications (verified): 1)  Atrovent 0.03 % Soln (Ipratropium Bromide) .... Spray 1 Spray Into Both Nostrils Twice A  Day 2)  Effexor Xr 37.5 Mg  Cp24 (Venlafaxine Hcl) .[name].. 1 By Mouth Once Daily 3)  Nadolol 20 Mg  Tabs (Nadolol) .... 1/2 Daily 4)  Nexium 40 Mg Cpdr (Esomeprazole Magnesium) .... Take 1 Capsule By Mouth Once A Day 5)  Levothyroxine Sodium 75 Mcg  Tabs (Levothyroxine Sodium) .[name].. 1 By Mouth Daily 6)  Caltrate 600+d 600-400 Mg-Unit Tabs (Calcium Carbonate-Vitamin D) .... Two Times A Day 7)  Stress Formula  Tabs (B Complex-C-Folic Acid) .... Once Daily 8)  Lipitor 10 Mg  Tabs (Atorvastatin Calcium) .... Take One Tablet Every Other Bedtime  Allergies (verified): 1)  ! * Flu Vaccination 2)  ! Pcn 3)  ! * Cat Gut 4)  ! Niacin 5)  ! * Eggs  Past History:  Past Medical History: Last updated: 10/19/2008 vertigo L ear hearing loss tinnitis Osteoporosis dvt after child birth Depression Hyperlipidemia Hypertension Hypothyroidism Breast cancer, hx of Melanoma DVT, hx of Hx of Eczema Hx of Allergic Rhinitis- skin test 1996 scanned  Past Surgical History: Last updated: 10/19/2008 Appendectomy Mastectomy L eye vitreous tear Facial melanoma Basal cell carcinoma Total Abdominal Hysterectomy Tonsillectomy  Family History: Last updated: 10/19/2008 Father- heart disease Mother- Cancer  Social History: Last updated: 10/19/2008 Retired Widowed, children Former Smoker- 1 ppd x 5 years Alcohol use-yes  Risk Factors: Smoking Status: quit (03/20/2009)  Physical Exam  General:   well-developed well-nourished female in no acute distress. HEENT exam atraumatic, normocephalic, nonicteric. Neck supple. Chest clear to auscultation cardiac exam S1-S2 are regular. Abdominal exam active bowel sounds, soft. Extremities no edema. Neurologic exam she is alert with a normal gait.   Impression & Recommendations:  Problem # 1:  DIARRHEA (ICD-787.91)  ? cause reviewed colonoscopy avoid dairy products screen for sprue  Orders: T-Sprue Panel (Celiac Disease Aby Eval) (83516x3/[address])  Problem # 2:  HYPOTHYROIDISM (  ICD-244.9)  check labs today. Her updated medication list for this problem includes:    Levothyroxine Sodium 75 Mcg Tabs (Levothyroxine sodium) .[name]... 1 by mouth daily  Labs Reviewed: TSH: 1.83 (09/18/2009)    Chol: 226 (09/18/2009)   HDL: 42.00 (09/18/2009)   LDL: 122 (03/20/2009)   TG: 189.0 (09/18/2009)  Orders: Venipuncture (13244) TLB-TSH (Thyroid Stimulating Hormone) (84443-TSH)  Problem # 3:  HYPERLIPIDEMIA  (ICD-272.4)  check labs today Her updated medication list for this problem includes:    Lipitor 10 Mg Tabs (Atorvastatin calcium) .[name]... Take one tablet every other bedtime  Labs Reviewed: SGOT: 26 (09/18/2009)   SGPT: 17 (09/18/2009)  Lipid Goals: Chol Goal: 200 (02/06/2006)   HDL Goal: 40 (02/06/2006)   LDL Goal: 130 (02/06/2006)   TG Goal: 150 (02/06/2006)  Prior 10 Yr Risk Heart Disease: 15 % (02/10/2006)   HDL:42.00 (09/18/2009), 41.10 (03/20/2009)  LDL:122 (03/20/2009), DEL (02/01/2008)  Chol:226 (09/18/2009), 198 (03/20/2009)  Trig:189.0 (09/18/2009), 177.0 (03/20/2009)  Orders: TLB-Lipid Panel (80061-LIPID) TLB-Hepatic/Liver Function Pnl (80076-HEPATIC)  Problem # 4:  HYPERTENSION (ICD-401.9)  controlled. Her updated medication list for this problem includes:    Nadolol 20 Mg Tabs (Nadolol) .[name]... 1/2 daily  BP today: 132/74 Prior BP: 118/72 (09/18/2009)  Prior 10 Yr Risk Heart Disease: 15 % (02/10/2006)  Labs Reviewed: K+: 4.8 (09/18/2009) Creat: : 0.8 (09/18/2009)   Chol: 226 (09/18/2009)   HDL: 42.00 (09/18/2009)   LDL: 122 (03/20/2009)   TG: 189.0 (09/18/2009)  Orders: TLB-BMP (Basic Metabolic Panel-BMET) (80048-METABOL)  Complete Medication List: 1)  Atrovent 0.03 % Soln (Ipratropium bromide) .... Spray 1 spray into both nostrils twice a  day 2)  Effexor Xr 37.5 Mg Cp24 (Venlafaxine hcl) .[name].. 1 by mouth once daily 3)  Nadolol 20 Mg Tabs (Nadolol) .... 1/2 daily 4)  Nexium 40 Mg Cpdr (Esomeprazole magnesium) .... Take 1 capsule by mouth once a day 5)  Levothyroxine Sodium 75 Mcg Tabs (Levothyroxine sodium) .[name].. 1 by mouth daily 6)  Caltrate 600+d 600-400 Mg-unit Tabs (Calcium carbonate-vitamin d) .... Two times a day 7)  Stress Formula Tabs (B complex-c-folic acid) .... Once daily 8)  Lipitor 10 Mg Tabs (Atorvastatin calcium) .... Take one tablet every other bedtime 9)  Fexofenadine Hcl 180 Mg Tabs (Fexofenadine hcl) .[name].. 1 once daily as needed  allergies  Other Orders: TLB-CBC Platelet - w/Differential (85025-CBCD) Prescriptions: ATROVENT 0.03 % SOLN (IPRATROPIUM BROMIDE) Spray 1 spray into both nostrils twice a  day  #3 x 3   Entered by:   Alfred Levins, CMA   Authorized by:   Birdie Sons MD   Signed by:   Alfred Levins, CMA on 03/19/2010   Method used:   Print then Give to Patient   RxID:   [id number] LIPITOR 10 MG TABS (ATORVASTATIN CALCIUM) Take one tablet every other bedtime  #45 x 3   Entered by:   Alfred Levins, CMA   Authorized by:   Birdie Sons MD   Signed by:   Alfred Levins, CMA on 03/19/2010   Method used:   Print then Give to Patient   RxID:   [id number] LEVOTHYROXINE SODIUM 75 MCG  TABS (LEVOTHYROXINE SODIUM) 1 by mouth daily  #90 x 3   Entered by:   Alfred Levins, CMA   Authorized by:   Birdie Sons MD   Signed by:   Alfred Levins, CMA on 03/19/2010   Method used:   Print then Give to Patient   RxID:   [id number] NEXIUM 40 MG CPDR (ESOMEPRAZOLE  MAGNESIUM) Take 1 capsule by mouth once a day  #90 x 3   Entered by:   Alfred Levins, CMA   Authorized by:   Birdie Sons MD   Signed by:   Alfred Levins, CMA on 03/19/2010   Method used:   Print then Give to Patient   RxID:   [id number] NADOLOL 20 MG  TABS (NADOLOL) 1/2 daily  #45 x 3   Entered by:   Alfred Levins, CMA   Authorized by:   Birdie Sons MD   Signed by:   Alfred Levins, CMA on 03/19/2010   Method used:   Print then Give to Patient   RxID:   [id number] EFFEXOR XR 37.5 MG  CP24 (VENLAFAXINE HCL) 1 by mouth once daily  #90 x 3   Entered by:   Alfred Levins, CMA   Authorized by:   Birdie Sons MD   Signed by:   Alfred Levins, CMA on 03/19/2010   Method used:   Print then Give to Patient   RxID:   [id number] FEXOFENADINE HCL 180 MG TABS (FEXOFENADINE HCL) 1 once daily as needed allergies  #90 x 3   Entered by:   Alfred Levins, CMA   Authorized by:   Birdie Sons MD   Signed by:   Alfred Levins, CMA on 03/19/2010   Method  used:   Print then Give to Patient   RxID:   [id number]    Orders Added: 1)  TLB-BMP (Basic Metabolic Panel-BMET) [80048-METABOL] 2)  Venipuncture [20254] 3)  TLB-TSH (Thyroid Stimulating Hormone) [84443-TSH] 4)  TLB-Lipid Panel [80061-LIPID] 5)  TLB-Hepatic/Liver Function Pnl [80076-HEPATIC] 6)  TLB-CBC Platelet - w/Differential [85025-CBCD] 7)  T-Sprue Panel (Celiac Disease Aby Eval) [83516x3/[address]]  Appended Document: Orders Update    Clinical Lists Changes  Orders: Added new Service order of Specimen Handling (27062) - Signed      Appended Document: 6 month rov/nrj    Nurse Visit   Allergies: 1)  ! * Flu Vaccination 2)  ! Pcn 3)  ! * Cat Gut 4)  ! Niacin 5)  ! * Eggs  Immunizations Administered:  Tetanus Vaccine:    Vaccine Type: Tdap    Site: left deltoid    Mfr: GlaxoSmithKline    Dose: 0.5 ml    Route: IM    Given by: Alfred Levins, CMA    Exp. Date: 12/29/2011    Lot #: [id number]  Orders Added: 1)  Tdap => 10yrs IM [90715] 2)  Admin 1st Vaccine [90471]  pt walked in and said she cut herself with a screwdriver and requested a tetanus vaccine.  Alfred Levins, CMA  March 19, 2010 3:05 PM

## 2010-04-15 ENCOUNTER — Other Ambulatory Visit: Payer: Self-pay | Admitting: Internal Medicine

## 2010-04-15 DIAGNOSIS — K219 Gastro-esophageal reflux disease without esophagitis: Secondary | ICD-10-CM

## 2010-06-22 ENCOUNTER — Other Ambulatory Visit: Payer: Self-pay | Admitting: Internal Medicine

## 2010-08-30 ENCOUNTER — Encounter: Payer: Self-pay | Admitting: Internal Medicine

## 2010-08-30 ENCOUNTER — Ambulatory Visit (INDEPENDENT_AMBULATORY_CARE_PROVIDER_SITE_OTHER): Payer: Medicare Other | Admitting: Internal Medicine

## 2010-08-30 VITALS — BP 144/76 | HR 76 | Ht 66.5 in | Wt 139.8 lb

## 2010-08-30 DIAGNOSIS — K219 Gastro-esophageal reflux disease without esophagitis: Secondary | ICD-10-CM

## 2010-08-30 DIAGNOSIS — K589 Irritable bowel syndrome without diarrhea: Secondary | ICD-10-CM

## 2010-08-30 DIAGNOSIS — R198 Other specified symptoms and signs involving the digestive system and abdomen: Secondary | ICD-10-CM

## 2010-08-30 DIAGNOSIS — R194 Change in bowel habit: Secondary | ICD-10-CM

## 2010-08-30 NOTE — Progress Notes (Signed)
HISTORY OF PRESENT ILLNESS:  Laura Li is a 70 y.o. female with the below list of medical problems who is followed in this office for GERD and colon cancer screening. Last evaluated September 2010 when she underwent screening colonoscopy. This was normal except for moderate sigmoid diverticulosis. Routine followup in 10 years recommended. For GERD she continues on Nexium 40 mg every other day. No reflux symptoms or dysphagia. She is known to have an incidental esophageal stricture. However, the reason for today's visit, is a new complaint of alternating bowel habits. Problems have been going on for at least one year. She describes loose stool alternating with constipation. Each occurs with approximately the same frequency, possibly slightly more constipation. She describes having one loose stool after lunch on certain days. She denies multiple loose stools per day. Reports going several days without a bowel movement. Historically she has tended to be constipated. She denies laxatives or antidiarrheals. There has been no nausea, vomiting, abdominal pain, bleeding, or weight loss. No fevers, steatorrhea, or new medications. She does have increased intestinal gas, though this has been long-standing. She saw her primary provider earlier this year. Workup, included testing for celiac disease. This was normal. Also, CBC, liver function studies, and thyroid stimulating hormone were all normal. GI review of systems otherwise negative  REVIEW OF SYSTEMS:  All non-GI ROS negative except for sinus and allergy trouble, headaches, hearing problems, swelling of the feet.  Past Medical History  Diagnosis Date  . OA (osteoarthritis)   . History of DVT (deep vein thrombosis)   . Depression   . Hyperlipidemia   . Hypertension   . Hypothyroidism   . HX: breast cancer     melanoma  . Melanoma   . Eczema   . Arthritis   . Esophageal stricture   . GERD (gastroesophageal reflux disease)     Past Surgical  History  Procedure Date  . Appendectomy 1967  . Mastectomy 1998    right  . Melanoma excision 2004    right side of face  . Mohs surgery 2005    right left  . Total abdominal hysterectomy 1992  . Tonsillectomy and adenoidectomy 1949  . Breast lumpectomy     left breast  . Breast surgery 1977    removal of calcified milk gland right breast  . Lipoma excision 1978    right breast    Social History Laura Li  reports that she quit smoking about 41 years ago. She does not have any smokeless tobacco history on file. She reports that she drinks alcohol. She reports that she does not use illicit drugs.  family history includes Brain cancer in her maternal aunt; Breast cancer in her cousin; Diabetes in her maternal aunt; Endometrial cancer in her mother; Heart disease in her father; and Lung cancer in her paternal aunt.  There is no history of Colon cancer.  Allergies  Allergen Reactions  . Niacin   . Penicillins     REACTION: GI upset       PHYSICAL EXAMINATION: Vital signs: BP 144/76  Pulse 76  Ht 5' 6.5" (1.689 m)  Wt 139 lb 12.8 oz (63.413 kg)  BMI 22.23 kg/m2  Constitutional: generally well-appearing, no acute distress Psychiatric: alert and oriented x3, cooperative Eyes: extraocular movements intact, anicteric, conjunctiva pink Mouth: oral pharynx moist, no lesions Neck: supple no lymphadenopathy Cardiovascular: heart regular rate and rhythm, no murmur Lungs: clear to auscultation bilaterally Abdomen: soft, nontender, nondistended, no obvious ascites, no peritoneal  signs, normal bowel sounds, no organomegaly Extremities: no lower extremity edema bilaterally Skin: no lesions on visible extremities Neuro: No focal deficits.   ASSESSMENT:  #1. Problems with mild alternating bowel habits as described. Most likely mild irritable bowel syndrome. No alarm features. Colonoscopy in 2010 revealing diverticulosis only. May be postinfectious given her age. #2. GERD.  Stable on every other day PPI  PLAN:  #1. Discussed irritable bowel. Provided reassurance. #2. IBS literature provided #3. Empiric trial of probiotic Align x2 weeks samples given #4. Fiber supplementation with Metamucil one to 2 tablespoons daily to help bowel irregularities #5. Continue PPI and reflux cautions #6. GI followup when necessary

## 2010-08-30 NOTE — Patient Instructions (Addendum)
Metamucil daily as directed   IBS brochure for you to review Align samples given take daily x 2 weeks.

## 2010-08-31 ENCOUNTER — Encounter: Payer: Self-pay | Admitting: Internal Medicine

## 2010-09-03 ENCOUNTER — Ambulatory Visit (INDEPENDENT_AMBULATORY_CARE_PROVIDER_SITE_OTHER): Payer: Medicare Other | Admitting: Internal Medicine

## 2010-09-03 ENCOUNTER — Encounter: Payer: Self-pay | Admitting: Internal Medicine

## 2010-09-03 VITALS — BP 130/78 | HR 68 | Temp 98.5°F | Ht 66.5 in | Wt 141.0 lb

## 2010-09-03 DIAGNOSIS — R51 Headache: Secondary | ICD-10-CM

## 2010-09-03 DIAGNOSIS — I10 Essential (primary) hypertension: Secondary | ICD-10-CM

## 2010-09-03 DIAGNOSIS — E785 Hyperlipidemia, unspecified: Secondary | ICD-10-CM

## 2010-09-03 NOTE — Progress Notes (Signed)
  Subjective:    Patient ID: Laura Li, female    DOB: 1940/04/24, 70 y.o.   MRN: [id number]  HPI  Diffuse headaches---intermittent. She will have them 5 days/week. Ongoing for months. Relief with ibuprofen. Pain rated as 4-6/10.   She has rare vertigo  htn---tolerating meds without difficulty  GI---has recently seen dr Marina Goodell (irritable bowel). She tells me that she is now taking probiotics  Mood disorder---she would like to taper off effexor.   Past Medical History  Diagnosis Date  . OA (osteoarthritis)   . History of DVT (deep vein thrombosis)   . Depression   . Hyperlipidemia   . Hypertension   . Hypothyroidism   . HX: breast cancer     melanoma  . Melanoma   . Eczema   . Arthritis   . Esophageal stricture   . GERD (gastroesophageal reflux disease)    Past Surgical History  Procedure Date  . Appendectomy 1967  . Mastectomy 1998    right  . Melanoma excision 2004    right side of face  . Mohs surgery 2005    right left  . Total abdominal hysterectomy 1992  . Tonsillectomy and adenoidectomy 1949  . Breast lumpectomy     left breast  . Breast surgery 1977    removal of calcified milk gland right breast  . Lipoma excision 1978    right breast    reports that she quit smoking about 41 years ago. She does not have any smokeless tobacco history on file. She reports that she drinks alcohol. She reports that she does not use illicit drugs. family history includes Brain cancer in her maternal aunt; Breast cancer in her cousin; Diabetes in her maternal aunt; Endometrial cancer in her mother; Heart disease in her father; and Lung cancer in her paternal aunt.  There is no history of Colon cancer. Allergies  Allergen Reactions  . Niacin   . Penicillins     REACTION: GI upset     Review of Systems  patient denies chest pain, shortness of breath, orthopnea. Denies lower extremity edema, abdominal pain, change in appetite, change in bowel movements. Patient denies  rashes, musculoskeletal complaints. No other specific complaints in a complete review of systems.      Objective:   Physical Exam  Well-developed well-nourished female in no acute distress. HEENT exam atraumatic, normocephalic, extraocular muscles are intact. Neck is supple. No jugular venous distention no thyromegaly. Chest clear to auscultation without increased work of breathing. Cardiac exam S1 and S2 are regular. Abdominal exam active bowel sounds, soft, nontender. Extremities no edema. Neurologic exam she is alert without any motor sensory deficits. Gait is normal.        Assessment & Plan:

## 2010-09-09 DIAGNOSIS — R519 Headache, unspecified: Secondary | ICD-10-CM | POA: Insufficient documentation

## 2010-09-09 DIAGNOSIS — R51 Headache: Secondary | ICD-10-CM | POA: Insufficient documentation

## 2010-09-09 NOTE — Assessment & Plan Note (Signed)
New problem. Not sure what type of headache she has. I think given the frequency she needs further evaluation. All referred to headache clinic.

## 2010-09-09 NOTE — Assessment & Plan Note (Signed)
Lipid panel was not ideal but I don't think she needs treatment at this time. I've encouraged her to follow a low-fat diet and increase aerobic exercise.

## 2010-09-09 NOTE — Assessment & Plan Note (Signed)
Adequate control. Continue current medications. 

## 2010-10-23 ENCOUNTER — Telehealth: Payer: Self-pay | Admitting: Internal Medicine

## 2010-10-23 NOTE — Telephone Encounter (Signed)
Pt called and said that she wants work in ov today with Dr Cato Mulligan only re: headaches, knee and back pain. Pt refuses to see another doctor.

## 2010-10-23 NOTE — Telephone Encounter (Signed)
We can't see her today.  She will need to see another dr.  Dr Cato Mulligan has a meeting today

## 2010-10-23 NOTE — Telephone Encounter (Signed)
Pt has been sch to see Dr Clent Ridges on Wed 10/24/10 as noted.

## 2010-10-24 ENCOUNTER — Encounter: Payer: Self-pay | Admitting: Family Medicine

## 2010-10-24 ENCOUNTER — Ambulatory Visit (INDEPENDENT_AMBULATORY_CARE_PROVIDER_SITE_OTHER): Payer: Medicare Other | Admitting: Family Medicine

## 2010-10-24 VITALS — BP 102/70 | HR 70 | Temp 97.8°F | Wt 138.0 lb

## 2010-10-24 DIAGNOSIS — R51 Headache: Secondary | ICD-10-CM

## 2010-10-24 DIAGNOSIS — M1990 Unspecified osteoarthritis, unspecified site: Secondary | ICD-10-CM

## 2010-10-24 MED ORDER — MELOXICAM 15 MG PO TABS: 15.0000 mg | ORAL_TABLET | Freq: Every day | ORAL | Status: DC

## 2010-10-24 NOTE — Progress Notes (Signed)
  Subjective:    Patient ID: Laura Li, female    DOB: 1940/09/03, 70 y.o.   MRN: [id number]  HPI Here asking my advice for chronic headaches and chronic osteoarthritis pains. She is a patient of Dr. Cato Mulligan. She gets HAs about every day to 2 days, and they sound like a mixture of tension HAs and migraines. Some of these are dull and  in the neck and the back of the head, while some are pounding and over the top of the head, along with light sensitivity. She had been using aspirin or ibuprofen OTC for these. She was referred to [facility], and she has seen Dr. Neale Burly there 3 times now. She has been given a few Decadron shots. She has been told to never take any type of pain medication for any type of pain ever again, and this includes for arthritis pains. She was also told to follow a rigid diet of no prepared foods, and she is to prepare her own fresh foods at home. He told her to never eat out at a restaurant again. She says that this is too inflexible and that she cannot live like this the rest of her life. She refuses to see Dr. Neale Burly again, and asks me to refer to another HA specialist.    Review of Systems  Constitutional: Negative.   Musculoskeletal: Positive for back pain, joint swelling and arthralgias. Negative for myalgias and gait problem.  Neurological: Positive for headaches.       Objective:   Physical Exam  Constitutional: She is oriented to person, place, and time. She appears well-developed and well-nourished.  Eyes: Conjunctivae are normal. Pupils are equal, round, and reactive to light.  Neck: Neck supple. No thyromegaly present.  Musculoskeletal: Normal range of motion. She exhibits no edema and no tenderness.  Lymphadenopathy:    She has no cervical adenopathy.  Neurological: She is alert and oriented to person, place, and time. No cranial nerve deficit.          Assessment & Plan:  First of all I told her that I could not refer her to  another HA specialist, but instead this would need to be done by Dr. Cato Mulligan. We agreed that she would not return to Dr. Neale Burly. It seems that most of her HAs are tension HAs related to the arthritis in her neck, so we will focus on treating this for now. Start on Meloxicam daily for inflammation and add ES Tylenol prn if she gets a HA. See Dr. Cato Mulligan soon

## 2010-11-13 ENCOUNTER — Other Ambulatory Visit: Payer: Self-pay | Admitting: Obstetrics and Gynecology

## 2010-11-13 DIAGNOSIS — Z9011 Acquired absence of right breast and nipple: Secondary | ICD-10-CM

## 2010-12-03 ENCOUNTER — Ambulatory Visit
Admission: RE | Admit: 2010-12-03 | Discharge: 2010-12-03 | Disposition: A | Discharge status: Home / Self Care | Payer: Medicare Other | Source: Ambulatory Visit | Attending: Obstetrics and Gynecology | Admitting: Obstetrics and Gynecology

## 2010-12-03 DIAGNOSIS — Z9011 Acquired absence of right breast and nipple: Secondary | ICD-10-CM

## 2010-12-11 ENCOUNTER — Ambulatory Visit (INDEPENDENT_AMBULATORY_CARE_PROVIDER_SITE_OTHER): Payer: Medicare Other | Admitting: Internal Medicine

## 2010-12-11 VITALS — BP 122/75 | Temp 98.5°F | Wt 140.0 lb

## 2010-12-11 DIAGNOSIS — I10 Essential (primary) hypertension: Secondary | ICD-10-CM

## 2010-12-11 DIAGNOSIS — E039 Hypothyroidism, unspecified: Secondary | ICD-10-CM

## 2010-12-11 DIAGNOSIS — R51 Headache: Secondary | ICD-10-CM

## 2010-12-11 MED ORDER — ATORVASTATIN CALCIUM 10 MG PO TABS: 10.0000 mg | ORAL_TABLET | ORAL | Status: DC

## 2010-12-11 MED ORDER — VENLAFAXINE HCL ER 37.5 MG PO CP24: 37.5000 mg | ORAL_CAPSULE | Freq: Every day | ORAL | Status: DC

## 2010-12-11 MED ORDER — NADOLOL 20 MG PO TABS: 10.0000 mg | ORAL_TABLET | Freq: Every day | ORAL | Status: DC

## 2010-12-11 MED ORDER — ESOMEPRAZOLE MAGNESIUM 40 MG PO CPDR: 40.0000 mg | DELAYED_RELEASE_CAPSULE | Freq: Every day | ORAL | Status: DC

## 2010-12-11 MED ORDER — LEVOTHYROXINE SODIUM 75 MCG PO TABS: 75.0000 ug | ORAL_TABLET | Freq: Every day | ORAL | Status: DC

## 2010-12-11 NOTE — Progress Notes (Signed)
  Subjective:    Patient ID: Laura Li, female    DOB: Feb 09, 1941, 70 y.o.   MRN: [id number]  HPI  Patient comes in for evaluation. She states "I have a whole list of things to discuss". I asked her to focus on the 3 most important issues she wishes to discuss today. She had a hard time doing that.  F/u headache clinic---she feels she was not given good advice at [facility]. Had two trigger point injections and was tried on topomax. Tried meloxicam/tylenol for headache---had gerd  After second trigger point injection she had back pain and diffuse pain.  She would like second opinion regarding headache.   She is concerned with upcoming "vertigo season". She understands maneuvers but is concerned with neck pain. She states that once a year her social have episodes of vertigo. These spontaneously resolved she is concerned about doing maneuvers because of ongoing neck discomfort.  Past Medical History  Diagnosis Date  . OA (osteoarthritis)   . History of DVT (deep vein thrombosis)   . Depression   . Hyperlipidemia   . Hypertension   . Hypothyroidism   . HX: breast cancer     melanoma  . Melanoma   . Eczema   . Arthritis   . Esophageal stricture   . GERD (gastroesophageal reflux disease)    Past Surgical History  Procedure Date  . Appendectomy 1967  . Mastectomy 1998    right  . Melanoma excision 2004    right side of face  . Mohs surgery 2005    right left  . Total abdominal hysterectomy 1992  . Tonsillectomy and adenoidectomy 1949  . Breast lumpectomy     left breast  . Breast surgery 1977    removal of calcified milk gland right breast  . Lipoma excision 1978    right breast    reports that she quit smoking about 41 years ago. She has never used smokeless tobacco. She reports that she drinks alcohol. She reports that she does not use illicit drugs. family history includes Brain cancer in her maternal aunt; Breast cancer in her cousin; Diabetes in her  maternal aunt; Endometrial cancer in her mother; Heart disease in her father; and Lung cancer in her paternal aunt.  There is no history of Colon cancer. Allergies  Allergen Reactions  . Niacin   . Penicillins     REACTION: GI upset     Review of Systems  patient denies chest pain, shortness of breath, orthopnea. Denies lower extremity edema, abdominal pain, change in appetite, change in bowel movements. Patient denies rashes, musculoskeletal complaints. No other specific complaints in a complete review of systems.      Objective:   Physical Exam  Well-developed well-nourished female in no acute distress. HEENT exam atraumatic, normocephalic, extraocular muscles are intact. Neck is supple. No jugular venous distention no thyromegaly. Chest clear to auscultation without increased work of breathing. Cardiac exam S1 and S2 are regular. Abdominal exam active bowel sounds, soft, nontender. Extremities no edema. Neurologic exam she is alert without any motor sensory deficits. Gait is normal.        Assessment & Plan:

## 2010-12-11 NOTE — Assessment & Plan Note (Addendum)
She would like second opinion. I will refer to tertiary care center for further evaluation.

## 2010-12-12 ENCOUNTER — Other Ambulatory Visit: Payer: Self-pay | Admitting: *Deleted

## 2010-12-12 MED ORDER — IPRATROPIUM BROMIDE 0.03 % NA SOLN: 2.0000 | Freq: Two times a day (BID) | NASAL | Status: DC

## 2010-12-12 MED ORDER — VENLAFAXINE HCL ER 37.5 MG PO CP24: 37.5000 mg | ORAL_CAPSULE | Freq: Every day | ORAL | Status: DC

## 2010-12-16 NOTE — Assessment & Plan Note (Signed)
BP Readings from Last 3 Encounters:  12/11/10 122/75  10/24/10 102/70  09/03/10 130/78   Well-controlled. Continue current therapy.

## 2010-12-16 NOTE — Assessment & Plan Note (Signed)
Previously controlled. Continue therapy.

## 2011-01-27 ENCOUNTER — Other Ambulatory Visit: Payer: Self-pay | Admitting: Internal Medicine

## 2011-02-11 ENCOUNTER — Ambulatory Visit (INDEPENDENT_AMBULATORY_CARE_PROVIDER_SITE_OTHER): Payer: Medicare Other | Admitting: Internal Medicine

## 2011-02-11 DIAGNOSIS — Z23 Encounter for immunization: Secondary | ICD-10-CM

## 2011-02-25 ENCOUNTER — Ambulatory Visit: Payer: Medicare Other | Admitting: Internal Medicine

## 2011-03-01 ENCOUNTER — Encounter: Payer: Self-pay | Admitting: Internal Medicine

## 2011-03-01 ENCOUNTER — Ambulatory Visit (INDEPENDENT_AMBULATORY_CARE_PROVIDER_SITE_OTHER): Payer: Medicare Other | Admitting: Internal Medicine

## 2011-03-01 VITALS — BP 130/80 | HR 76 | Temp 97.7°F | Ht 67.0 in | Wt 143.0 lb

## 2011-03-01 DIAGNOSIS — Z853 Personal history of malignant neoplasm of breast: Secondary | ICD-10-CM

## 2011-03-01 DIAGNOSIS — E785 Hyperlipidemia, unspecified: Secondary | ICD-10-CM

## 2011-03-01 DIAGNOSIS — I10 Essential (primary) hypertension: Secondary | ICD-10-CM

## 2011-03-01 DIAGNOSIS — E039 Hypothyroidism, unspecified: Secondary | ICD-10-CM

## 2011-03-01 LAB — LIPID PANEL
HDL: 45.3 mg/dL (ref >39.00)
Triglycerides: 180 mg/dL — ABNORMAL HIGH (ref 0.0–149.0)

## 2011-03-01 LAB — BASIC METABOLIC PANEL WITH GFR
BUN: 17 mg/dL (ref 6–23)
CO2: 29 meq/L (ref 19–32)
Calcium: 9.7 mg/dL (ref 8.4–10.5)
Chloride: 104 meq/L (ref 96–112)
Creatinine, Ser: 0.8 mg/dL (ref 0.4–1.2)
GFR: 70.78 mL/min
Glucose, Bld: 65 mg/dL — ABNORMAL LOW (ref 70–99)
Potassium: 5.2 meq/L — ABNORMAL HIGH (ref 3.5–5.1)
Sodium: 142 meq/L (ref 135–145)

## 2011-03-01 LAB — HEPATIC FUNCTION PANEL
ALT: 21 U/L (ref 0–35)
AST: 25 U/L (ref 0–37)
Albumin: 4.4 g/dL (ref 3.5–5.2)
Total Bilirubin: 0.7 mg/dL (ref 0.3–1.2)

## 2011-03-01 MED ORDER — IPRATROPIUM BROMIDE 0.03 % NA SOLN: 2.0000 | Freq: Two times a day (BID) | NASAL | Status: DC

## 2011-03-01 NOTE — Assessment & Plan Note (Signed)
Tolerating meds Check today 

## 2011-03-01 NOTE — Assessment & Plan Note (Signed)
No known recurrence 

## 2011-03-01 NOTE — Progress Notes (Signed)
Addended by: Alfred Levins D on: 03/01/2011 02:14 PM   Modules accepted: Orders

## 2011-03-01 NOTE — Assessment & Plan Note (Signed)
Controlled Continue current meds 

## 2011-03-01 NOTE — Progress Notes (Signed)
Patient ID: Laura Li, female   DOB: 1940-08-02, 70 y.o.   MRN: [id number] Hot flashes--seem to be worse. She wonders if because of lower dose of effexor.  htn---tolerating meds  Headaches - using baclofen , only taking 1/4 of prescribed dose---timing of baclofen may correlate with hot flashes.   Hypothyroid--tolerating meds.  Past Medical History  Diagnosis Date  . OA (osteoarthritis)   . History of DVT (deep vein thrombosis)   . Depression   . Hyperlipidemia   . Hypertension   . Hypothyroidism   . HX: breast cancer     melanoma  . Melanoma   . Eczema   . Arthritis   . Esophageal stricture   . GERD (gastroesophageal reflux disease)     History   Social History  . Marital Status: Widowed    Spouse Name: N/A    Number of Children: N/A  . Years of Education: N/A   Occupational History  . retired    Social History Main Topics  . Smoking status: Former Smoker    Quit date: 03/11/1969  . Smokeless tobacco: Never Used  . Alcohol Use: Yes     occasional wine  . Drug Use: No  . Sexually Active: Not on file   Other Topics Concern  . Not on file   Social History Narrative  . No narrative on file    Past Surgical History  Procedure Date  . Appendectomy 1967  . Mastectomy 1998    right  . Melanoma excision 2004    right side of face  . Mohs surgery 2005    right left  . Total abdominal hysterectomy 1992  . Tonsillectomy and adenoidectomy 1949  . Breast lumpectomy     left breast  . Breast surgery 1977    removal of calcified milk gland right breast  . Lipoma excision 1978    right breast    Family History  Problem Relation Age of Onset  . Heart disease Father   . Endometrial cancer Mother   . Breast cancer Cousin     x 3  . Lung cancer Paternal Aunt   . Brain cancer Maternal Aunt   . Colon cancer Neg Hx   . Diabetes Maternal Aunt     ???    Allergies  Allergen Reactions  . Niacin   . Penicillins     REACTION: GI upset    Current  Outpatient Prescriptions on File Prior to Visit  Medication Sig Dispense Refill  . acetaminophen (TYLENOL) 500 MG tablet Take 500 mg by mouth every 6 (six) hours as needed.        [name] atorvastatin (LIPITOR) 10 MG tablet Take 1 tablet (10 mg total) by mouth every other day. rx'ed as once daily  90 tablet  3  . B Complex-C-Folic Acid (STRESS FORMULA PO) Take 1 tablet by mouth daily.        . Calcium-Vitamin D (CALTRATE 600 PLUS-VIT D PO) Take 2 tablets by mouth daily.       . fexofenadine (ALLEGRA) 180 MG tablet Take 180 mg by mouth daily as needed.       [name] levothyroxine (SYNTHROID, LEVOTHROID) 75 MCG tablet Take 1 tablet (75 mcg total) by mouth daily.    90 tablet  3  . nadolol (CORGARD) 20 MG tablet Take 0.5 tablets (10 mg total) by mouth daily. Take 1/2 tablet by mouth once daily  90 tablet  3  . NEXIUM 40 MG capsule TAKE 1 CAPSULE  DAILY  90 capsule  1  . psyllium (METAMUCIL) 58.6 % powder Take 1 packet by mouth daily.        [name] venlafaxine (EFFEXOR-XR) 37.5 MG 24 hr capsule Take 1 capsule (37.5 mg total) by mouth daily.  90 capsule  3     patient denies chest pain, shortness of breath, orthopnea. Denies lower extremity edema, abdominal pain, change in appetite, change in bowel movements. Patient denies rashes, musculoskeletal complaints. No other specific complaints in a complete review of systems.   BP 130/80  Pulse 76  Temp(Src) 97.7 F (36.5 C) (Oral)  Ht 5\' 7"  (1.702 m)  Wt 143 lb (64.864 kg)  BMI 22.40 kg/m2  Well-developed well-nourished female in no acute distress. HEENT exam atraumatic, normocephalic, extraocular muscles are intact. Neck is supple. No jugular venous distention no thyromegaly. Chest clear to auscultation without increased work of breathing. Cardiac exam S1 and S2 are regular. Abdominal exam active bowel sounds, soft, nontender. Extremities no edema. Neurologic exam she is alert without any motor sensory deficits. Gait is normal.

## 2011-03-01 NOTE — Assessment & Plan Note (Signed)
Check labs today.

## 2011-03-01 NOTE — Progress Notes (Signed)
Addended by: Bonnye Fava on: 03/01/2011 10:15 AM   Modules accepted: Orders

## 2011-04-04 ENCOUNTER — Telehealth: Payer: Self-pay | Admitting: Internal Medicine

## 2011-04-04 NOTE — Telephone Encounter (Signed)
Refill Effexor to [facility]. Thanks.

## 2011-04-05 MED ORDER — VENLAFAXINE HCL ER 37.5 MG PO CP24: 37.5000 mg | ORAL_CAPSULE | Freq: Every day | ORAL | Status: DC

## 2011-04-05 NOTE — Telephone Encounter (Signed)
rx sent in eletronically

## 2011-04-09 DIAGNOSIS — R413 Other amnesia: Secondary | ICD-10-CM | POA: Diagnosis not present

## 2011-04-09 DIAGNOSIS — G44209 Tension-type headache, unspecified, not intractable: Secondary | ICD-10-CM | POA: Diagnosis not present

## 2011-04-09 DIAGNOSIS — M542 Cervicalgia: Secondary | ICD-10-CM | POA: Diagnosis not present

## 2011-04-26 ENCOUNTER — Ambulatory Visit: Payer: Medicare Other | Attending: Neurology | Admitting: Physical Therapy

## 2011-04-26 DIAGNOSIS — M2550 Pain in unspecified joint: Secondary | ICD-10-CM | POA: Diagnosis not present

## 2011-04-26 DIAGNOSIS — IMO0001 Reserved for inherently not codable concepts without codable children: Secondary | ICD-10-CM | POA: Diagnosis not present

## 2011-05-02 ENCOUNTER — Ambulatory Visit: Payer: Medicare Other | Admitting: Physical Therapy

## 2011-05-02 DIAGNOSIS — IMO0001 Reserved for inherently not codable concepts without codable children: Secondary | ICD-10-CM | POA: Diagnosis not present

## 2011-05-02 DIAGNOSIS — M2550 Pain in unspecified joint: Secondary | ICD-10-CM | POA: Diagnosis not present

## 2011-05-07 ENCOUNTER — Ambulatory Visit: Payer: Medicare Other

## 2011-05-07 DIAGNOSIS — M2550 Pain in unspecified joint: Secondary | ICD-10-CM | POA: Diagnosis not present

## 2011-05-07 DIAGNOSIS — IMO0001 Reserved for inherently not codable concepts without codable children: Secondary | ICD-10-CM | POA: Diagnosis not present

## 2011-05-10 ENCOUNTER — Ambulatory Visit: Payer: Medicare Other | Attending: Neurology

## 2011-05-10 DIAGNOSIS — M2550 Pain in unspecified joint: Secondary | ICD-10-CM | POA: Diagnosis not present

## 2011-05-10 DIAGNOSIS — IMO0001 Reserved for inherently not codable concepts without codable children: Secondary | ICD-10-CM | POA: Diagnosis not present

## 2011-05-14 ENCOUNTER — Ambulatory Visit: Payer: Medicare Other

## 2011-05-14 DIAGNOSIS — IMO0001 Reserved for inherently not codable concepts without codable children: Secondary | ICD-10-CM | POA: Diagnosis not present

## 2011-05-14 DIAGNOSIS — M2550 Pain in unspecified joint: Secondary | ICD-10-CM | POA: Diagnosis not present

## 2011-05-17 ENCOUNTER — Encounter: Payer: Medicare Other | Admitting: Physical Therapy

## 2011-05-21 ENCOUNTER — Ambulatory Visit: Payer: Medicare Other

## 2011-05-21 DIAGNOSIS — IMO0001 Reserved for inherently not codable concepts without codable children: Secondary | ICD-10-CM | POA: Diagnosis not present

## 2011-05-21 DIAGNOSIS — M2550 Pain in unspecified joint: Secondary | ICD-10-CM | POA: Diagnosis not present

## 2011-05-23 ENCOUNTER — Telehealth: Payer: Self-pay | Admitting: Internal Medicine

## 2011-05-23 MED ORDER — ATORVASTATIN CALCIUM 10 MG PO TABS: 10.0000 mg | ORAL_TABLET | ORAL | Status: DC

## 2011-05-23 NOTE — Telephone Encounter (Signed)
Atorvastatin sent in electronically, ok to increase effexor?

## 2011-05-23 NOTE — Telephone Encounter (Signed)
Increase effexor to 75 mg po qd

## 2011-05-23 NOTE — Telephone Encounter (Signed)
Pt requesting refill on  atorvastatin (LIPITOR) 10 MG tablet  And venlafaxine (EFFEXOR-XR) 37.5 MG 24 hr capsule..... Pt requesting to change does amount of Effexor to 75mg     [facility]

## 2011-05-24 MED ORDER — VENLAFAXINE HCL ER 75 MG PO CP24: 75.0000 mg | ORAL_CAPSULE | Freq: Every day | ORAL | Status: DC

## 2011-05-24 NOTE — Telephone Encounter (Signed)
Pt aware, rx sent in electronically to [facility]

## 2011-06-18 ENCOUNTER — Other Ambulatory Visit: Payer: Self-pay | Admitting: *Deleted

## 2011-08-16 DIAGNOSIS — M542 Cervicalgia: Secondary | ICD-10-CM | POA: Diagnosis not present

## 2011-08-16 DIAGNOSIS — G44209 Tension-type headache, unspecified, not intractable: Secondary | ICD-10-CM | POA: Diagnosis not present

## 2011-08-16 DIAGNOSIS — R51 Headache: Secondary | ICD-10-CM | POA: Diagnosis not present

## 2011-08-16 DIAGNOSIS — R413 Other amnesia: Secondary | ICD-10-CM | POA: Diagnosis not present

## 2011-11-07 ENCOUNTER — Telehealth: Payer: Self-pay | Admitting: Internal Medicine

## 2011-11-07 MED ORDER — VENLAFAXINE HCL ER 75 MG PO CP24: 75.0000 mg | ORAL_CAPSULE | Freq: Every day | ORAL | Status: DC

## 2011-11-07 NOTE — Telephone Encounter (Signed)
Pt requesting refill on venlafaxine (EFFEXOR XR) 75 MG 24 hr capsule   [facility]

## 2011-11-07 NOTE — Telephone Encounter (Signed)
rx sent in electronically 

## 2011-12-09 ENCOUNTER — Other Ambulatory Visit: Payer: Self-pay | Admitting: Obstetrics and Gynecology

## 2011-12-09 ENCOUNTER — Other Ambulatory Visit: Payer: Self-pay | Admitting: *Deleted

## 2011-12-09 DIAGNOSIS — Z1231 Encounter for screening mammogram for malignant neoplasm of breast: Secondary | ICD-10-CM

## 2011-12-09 DIAGNOSIS — Z9011 Acquired absence of right breast and nipple: Secondary | ICD-10-CM

## 2011-12-09 MED ORDER — LEVOTHYROXINE SODIUM 75 MCG PO TABS: 75.0000 ug | ORAL_TABLET | Freq: Every day | ORAL | Status: DC

## 2011-12-09 MED ORDER — NADOLOL 20 MG PO TABS: 10.0000 mg | ORAL_TABLET | Freq: Every day | ORAL | Status: DC

## 2011-12-26 ENCOUNTER — Other Ambulatory Visit: Payer: Self-pay | Admitting: *Deleted

## 2011-12-26 ENCOUNTER — Other Ambulatory Visit: Payer: Self-pay | Admitting: Obstetrics and Gynecology

## 2011-12-26 DIAGNOSIS — Z01419 Encounter for gynecological examination (general) (routine) without abnormal findings: Secondary | ICD-10-CM | POA: Diagnosis not present

## 2011-12-26 DIAGNOSIS — Z124 Encounter for screening for malignant neoplasm of cervix: Secondary | ICD-10-CM | POA: Diagnosis not present

## 2011-12-26 MED ORDER — IPRATROPIUM BROMIDE 0.03 % NA SOLN: 2.0000 | Freq: Two times a day (BID) | NASAL | Status: DC

## 2012-01-03 ENCOUNTER — Ambulatory Visit
Admission: RE | Admit: 2012-01-03 | Discharge: 2012-01-03 | Disposition: A | Discharge status: Home / Self Care | Payer: Medicare Other | Source: Ambulatory Visit | Attending: Obstetrics and Gynecology | Admitting: Obstetrics and Gynecology

## 2012-01-03 DIAGNOSIS — Z1231 Encounter for screening mammogram for malignant neoplasm of breast: Secondary | ICD-10-CM | POA: Diagnosis not present

## 2012-01-03 DIAGNOSIS — Z9011 Acquired absence of right breast and nipple: Secondary | ICD-10-CM

## 2012-01-10 DIAGNOSIS — L821 Other seborrheic keratosis: Secondary | ICD-10-CM | POA: Diagnosis not present

## 2012-01-10 DIAGNOSIS — D280 Benign neoplasm of vulva: Secondary | ICD-10-CM | POA: Diagnosis not present

## 2012-02-10 DIAGNOSIS — Z23 Encounter for immunization: Secondary | ICD-10-CM | POA: Diagnosis not present

## 2012-02-19 ENCOUNTER — Encounter: Payer: Medicare Other | Admitting: Internal Medicine

## 2012-02-24 ENCOUNTER — Ambulatory Visit (INDEPENDENT_AMBULATORY_CARE_PROVIDER_SITE_OTHER): Payer: Medicare Other | Admitting: Internal Medicine

## 2012-02-24 ENCOUNTER — Encounter: Payer: Self-pay | Admitting: Internal Medicine

## 2012-02-24 VITALS — BP 144/84 | HR 80 | Temp 98.2°F | Ht 66.75 in | Wt 145.0 lb

## 2012-02-24 DIAGNOSIS — E039 Hypothyroidism, unspecified: Secondary | ICD-10-CM

## 2012-02-24 DIAGNOSIS — Z0000 Encounter for general adult medical examination without abnormal findings: Secondary | ICD-10-CM | POA: Diagnosis not present

## 2012-02-24 DIAGNOSIS — E785 Hyperlipidemia, unspecified: Secondary | ICD-10-CM

## 2012-02-25 LAB — BASIC METABOLIC PANEL
BUN: 16 mg/dL (ref 6–23)
Chloride: 99 mEq/L (ref 96–112)
GFR: 73.61 mL/min (ref >60.00)
Glucose, Bld: 87 mg/dL (ref 70–99)
Potassium: 4.5 mEq/L (ref 3.5–5.1)

## 2012-02-25 LAB — CBC WITH DIFFERENTIAL/PLATELET
Basophils Absolute: 0 10*3/uL (ref 0.0–0.1)
Eosinophils Absolute: 0.2 10*3/uL (ref 0.0–0.7)
HCT: 39.1 % (ref 36.0–46.0)
Lymphs Abs: 1.5 10*3/uL (ref 0.7–4.0)
MCV: 88.3 fl (ref 78.0–100.0)
Monocytes Absolute: 0.4 10*3/uL (ref 0.1–1.0)
Platelets: 226 10*3/uL (ref 150.0–400.0)
RDW: 13.3 % (ref 11.5–14.6)

## 2012-02-25 LAB — HEPATIC FUNCTION PANEL
ALT: 20 U/L (ref 0–35)
AST: 24 U/L (ref 0–37)
Alkaline Phosphatase: 75 U/L (ref 39–117)
Bilirubin, Direct: 0 mg/dL (ref 0.0–0.3)
Total Protein: 8.3 g/dL (ref 6.0–8.3)

## 2012-02-25 LAB — LIPID PANEL: HDL: 37.7 mg/dL — ABNORMAL LOW (ref >39.00)

## 2012-02-25 NOTE — Progress Notes (Signed)
Patient ID: [name], female   DOB: 12-07-1940, 71 y.o.   MRN: [id number] Scanned progress note due to unexpected downtime

## 2012-03-12 DIAGNOSIS — H01029 Squamous blepharitis unspecified eye, unspecified eyelid: Secondary | ICD-10-CM | POA: Diagnosis not present

## 2012-05-19 ENCOUNTER — Other Ambulatory Visit: Payer: Self-pay | Admitting: *Deleted

## 2012-05-19 MED ORDER — IPRATROPIUM BROMIDE 0.03 % NA SOLN: 2.0000 | Freq: Two times a day (BID) | NASAL | Status: DC

## 2012-09-10 ENCOUNTER — Telehealth: Payer: Self-pay | Admitting: Internal Medicine

## 2012-09-10 NOTE — Telephone Encounter (Signed)
Verbal order given, they will fax form over

## 2012-09-10 NOTE — Telephone Encounter (Addendum)
[facility] is requesting a rx for patient to have replacement of mastectomy supplies please include office note and med list, please make sure med list has her  mastectomy supplies listed per Medicare guideline.

## 2012-09-29 DIAGNOSIS — H905 Unspecified sensorineural hearing loss: Secondary | ICD-10-CM | POA: Diagnosis not present

## 2012-09-30 DIAGNOSIS — H2510 Age-related nuclear cataract, unspecified eye: Secondary | ICD-10-CM | POA: Diagnosis not present

## 2012-10-15 DIAGNOSIS — H905 Unspecified sensorineural hearing loss: Secondary | ICD-10-CM | POA: Diagnosis not present

## 2012-11-09 DIAGNOSIS — I776 Arteritis, unspecified: Secondary | ICD-10-CM | POA: Insufficient documentation

## 2012-11-09 DIAGNOSIS — Z8679 Personal history of other diseases of the circulatory system: Secondary | ICD-10-CM | POA: Insufficient documentation

## 2012-11-27 ENCOUNTER — Telehealth: Payer: Self-pay | Admitting: Internal Medicine

## 2012-11-27 NOTE — Telephone Encounter (Signed)
[facility] sent in detailed order for pt. They would like to know if you received. If so, return asap please. Any problems, pls calll

## 2012-12-01 ENCOUNTER — Encounter: Payer: Self-pay | Admitting: Family

## 2012-12-01 ENCOUNTER — Ambulatory Visit (INDEPENDENT_AMBULATORY_CARE_PROVIDER_SITE_OTHER): Payer: Medicare Other | Admitting: Family

## 2012-12-01 VITALS — BP 120/64 | HR 64 | Wt 140.0 lb

## 2012-12-01 DIAGNOSIS — I10 Essential (primary) hypertension: Secondary | ICD-10-CM

## 2012-12-01 DIAGNOSIS — R5381 Other malaise: Secondary | ICD-10-CM | POA: Diagnosis not present

## 2012-12-01 DIAGNOSIS — D72829 Elevated white blood cell count, unspecified: Secondary | ICD-10-CM | POA: Diagnosis not present

## 2012-12-01 DIAGNOSIS — E039 Hypothyroidism, unspecified: Secondary | ICD-10-CM

## 2012-12-01 LAB — BASIC METABOLIC PANEL
BUN: 27 mg/dL — ABNORMAL HIGH (ref 6–23)
CO2: 33 mEq/L — ABNORMAL HIGH (ref 19–32)
Calcium: 9.9 mg/dL (ref 8.4–10.5)
Creatinine, Ser: 1.7 mg/dL — ABNORMAL HIGH (ref 0.4–1.2)
Glucose, Bld: 108 mg/dL — ABNORMAL HIGH (ref 70–99)

## 2012-12-01 LAB — CBC WITH DIFFERENTIAL/PLATELET
Eosinophils Absolute: 0.6 10*3/uL (ref 0.0–0.7)
HCT: 30.9 % — ABNORMAL LOW (ref 36.0–46.0)
Lymphs Abs: 1.5 10*3/uL (ref 0.7–4.0)
MCHC: 34.8 g/dL (ref 30.0–36.0)
MCV: 87.3 fl (ref 78.0–100.0)
Monocytes Absolute: 0.9 10*3/uL (ref 0.1–1.0)
Neutrophils Relative %: 71.1 % (ref 43.0–77.0)
Platelets: 330 10*3/uL (ref 150.0–400.0)

## 2012-12-01 NOTE — Progress Notes (Signed)
Subjective:    Patient ID: Laura Li, female    DOB: 04-Apr-1940, 72 y.o.   MRN: [id number]  HPI 72 year old white female, not, patient after sources in today with complaints of feeling fatigued, nausea, myalgias and headache x2 months. Reports the symptoms started shortly after taking Dyazide that was began by ENT for management of Mnire's disease. Reports her ear is being better. She'll to take Effexor for depression but hasn't taken it since 1998.   Review of Systems  Constitutional: Positive for fatigue.  HENT: Positive for nosebleeds and congestion.   Respiratory: Negative.   Cardiovascular: Negative.   Musculoskeletal: Negative.   Skin: Negative.   Allergic/Immunologic: Negative.   Neurological: Negative.   Hematological: Negative.   Psychiatric/Behavioral: Negative.    Past Medical History  Diagnosis Date  . OA (osteoarthritis)   . History of DVT (deep vein thrombosis)   . Depression   . Hyperlipidemia   . Hypertension   . Hypothyroidism   . HX: breast cancer     melanoma  . Melanoma   . Eczema   . Arthritis   . Esophageal stricture   . GERD (gastroesophageal reflux disease)     History   Social History  . Marital Status: Widowed    Spouse Name: N/A    Number of Children: N/A  . Years of Education: N/A   Occupational History  . retired    Social History Main Topics  . Smoking status: Former Smoker    Quit date: 03/11/1969  . Smokeless tobacco: Never Used  . Alcohol Use: Yes     Comment: occasional wine  . Drug Use: No  . Sexual Activity: Not on file   Other Topics Concern  . Not on file   Social History Narrative  . No narrative on file    Past Surgical History  Procedure Laterality Date  . Appendectomy  1967  . Mastectomy  1998    right  . Melanoma excision  2004    right side of face  . Mohs surgery  2005    right left  . Total abdominal hysterectomy  1992  . Tonsillectomy and adenoidectomy  1949  . Breast lumpectomy      left  breast  . Breast surgery  1977    removal of calcified milk gland right breast  . Lipoma excision  1978    right breast    Family History  Problem Relation Age of Onset  . Heart disease Father   . Endometrial cancer Mother   . Breast cancer Cousin     x 3  . Lung cancer Paternal Aunt   . Brain cancer Maternal Aunt   . Colon cancer Neg Hx   . Diabetes Maternal Aunt     ???    Allergies  Allergen Reactions  . Niacin   . Penicillins     REACTION: GI upset    Current Outpatient Prescriptions on File Prior to Visit  Medication Sig Dispense Refill  . acetaminophen (TYLENOL) 500 MG tablet Take 500 mg by mouth every 6 (six) hours as needed.        [name] atorvastatin (LIPITOR) 10 MG tablet Take 1 tablet (10 mg total) by mouth every other day. rx'ed as once daily  90 tablet  3  . B Complex-C-Folic Acid (STRESS FORMULA PO) Take 1 tablet by mouth daily.        . Calcium-Vitamin D (CALTRATE 600 PLUS-VIT D PO) Take 2 tablets by mouth daily.       [name]  fexofenadine (ALLEGRA) 180 MG tablet Take 180 mg by mouth daily as needed.       [name] ipratropium (ATROVENT) 0.03 % nasal spray Place 2 sprays into the nose every 12 (twelve) hours.  180 mL  1  . levothyroxine (SYNTHROID, LEVOTHROID) 75 MCG tablet Take 1 tablet (75 mcg total) by mouth daily.  90 tablet  0  . nadolol (CORGARD) 20 MG tablet Take 0.5 tablets (10 mg total) by mouth daily.  90 tablet  0  . NEXIUM 40 MG capsule TAKE 1 CAPSULE DAILY  90 capsule  1  . psyllium (METAMUCIL) 58.6 % powder Take 1 packet by mouth daily.        [name] venlafaxine XR (EFFEXOR XR) 75 MG 24 hr capsule Take 1 capsule (75 mg total) by mouth daily.  90 capsule  1   No current facility-administered medications on file prior to visit.    BP 120/64  Pulse 64  Wt 140 lb (63.504 kg)  BMI 22.1 kg/m2chart    Objective:   Physical Exam  Constitutional: She is oriented to person, place, and time. She appears well-developed and well-nourished.  HENT:  Right Ear: External ear  normal.  Left Ear: External ear normal.  Nose: Nose normal.  Mouth/Throat: Oropharynx is clear and moist.  Neck: Normal range of motion. Neck supple.  Cardiovascular: Normal rate, regular rhythm and normal heart sounds.   Pulmonary/Chest: Effort normal and breath sounds normal.  Abdominal: Soft. Bowel sounds are normal.  Musculoskeletal: Normal range of motion.  Neurological: She is alert and oriented to person, place, and time.  Skin: Skin is warm and dry.  Psychiatric: She has a normal mood and affect.          Assessment & Plan:  Assessment:  1. Fatigue 2. Nausea 3. Myalgia 4. Headache  Plan: Take 1/2 tab of Dyazide once daily. Labs sent to be sure electrolytes are normal. Call the office if symptoms worsen or persist. See ENT if symptoms persist.

## 2012-12-02 ENCOUNTER — Telehealth: Payer: Self-pay | Admitting: Internal Medicine

## 2012-12-02 ENCOUNTER — Encounter: Payer: Self-pay | Admitting: Internal Medicine

## 2012-12-02 ENCOUNTER — Other Ambulatory Visit: Payer: Self-pay | Admitting: Family

## 2012-12-02 DIAGNOSIS — D72829 Elevated white blood cell count, unspecified: Secondary | ICD-10-CM

## 2012-12-02 DIAGNOSIS — N3000 Acute cystitis without hematuria: Secondary | ICD-10-CM | POA: Diagnosis not present

## 2012-12-02 DIAGNOSIS — R350 Frequency of micturition: Secondary | ICD-10-CM

## 2012-12-02 LAB — POCT URINALYSIS DIPSTICK
Protein, UA: NEGATIVE
Spec Grav, UA: 1.01
Urobilinogen, UA: 0.2
pH, UA: 6

## 2012-12-02 NOTE — Addendum Note (Signed)
Addended by: Rita Ohara R on: 12/02/2012 02:13 PM   Modules accepted: Orders

## 2012-12-02 NOTE — Addendum Note (Signed)
Addended by: Rita Ohara R on: 12/02/2012 01:38 PM   Modules accepted: Orders

## 2012-12-02 NOTE — Telephone Encounter (Signed)
Pt would like ua results. Pt had last colonoscopy in 2010.

## 2012-12-03 ENCOUNTER — Telehealth: Payer: Self-pay | Admitting: Internal Medicine

## 2012-12-03 NOTE — Telephone Encounter (Signed)
See CAN note 

## 2012-12-03 NOTE — Telephone Encounter (Signed)
Noted  

## 2012-12-03 NOTE — Telephone Encounter (Signed)
Advised pt that urine was sent for cx and I will keep her posted on what Oran Rein says once cx is received.

## 2012-12-03 NOTE — Telephone Encounter (Signed)
Pt daughter kim would like tamesha to return her call concerning her mother UA results

## 2012-12-03 NOTE — Telephone Encounter (Signed)
This was faxed yesterday

## 2012-12-03 NOTE — Telephone Encounter (Signed)
Caller: Kim/Child; Phone: [phone]; Reason for Call: Called to clarify message from office staff regarding mom's urine and lab reports.  Confirmed HIPAA.  Per Tamisha note in Epic 12/02/12, advised "Advised pt that urine was sent for cx (culture) and I will keep her posted on what Padonda says once cx is received. " Also aware Mom's blood count is lower than it has been.  Concerned symptoms/abnormal lab results began after starting new medication.  Aware there will be additional follow up after urine culture is back.

## 2012-12-04 ENCOUNTER — Encounter: Payer: Self-pay | Admitting: Family Medicine

## 2012-12-04 ENCOUNTER — Telehealth: Payer: Self-pay | Admitting: Internal Medicine

## 2012-12-04 ENCOUNTER — Ambulatory Visit (INDEPENDENT_AMBULATORY_CARE_PROVIDER_SITE_OTHER): Payer: Medicare Other | Admitting: Family Medicine

## 2012-12-04 VITALS — BP 140/74 | HR 68 | Temp 97.8°F | Wt 141.0 lb

## 2012-12-04 DIAGNOSIS — D649 Anemia, unspecified: Secondary | ICD-10-CM

## 2012-12-04 DIAGNOSIS — N289 Disorder of kidney and ureter, unspecified: Secondary | ICD-10-CM

## 2012-12-04 DIAGNOSIS — R5383 Other fatigue: Secondary | ICD-10-CM

## 2012-12-04 DIAGNOSIS — R5381 Other malaise: Secondary | ICD-10-CM | POA: Diagnosis not present

## 2012-12-04 LAB — CBC WITH DIFFERENTIAL/PLATELET
Basophils Absolute: 0.1 10*3/uL (ref 0.0–0.1)
HCT: 28.8 % — ABNORMAL LOW (ref 36.0–46.0)
Hemoglobin: 10.1 g/dL — ABNORMAL LOW (ref 12.0–15.0)
Lymphocytes Relative: 16.1 % (ref 12.0–46.0)
Lymphs Abs: 1.8 10*3/uL (ref 0.7–4.0)
MCHC: 35.1 g/dL (ref 30.0–36.0)
MCV: 86.8 fl (ref 78.0–100.0)
Monocytes Absolute: 1 10*3/uL (ref 0.1–1.0)
Monocytes Relative: 9.1 % (ref 3.0–12.0)
Neutro Abs: 7.6 10*3/uL (ref 1.4–7.7)
RDW: 13.4 % (ref 11.5–14.6)

## 2012-12-04 LAB — IRON AND TIBC
%SAT: 16 % — ABNORMAL LOW (ref 20–55)
Iron: 42 ug/dL (ref 42–145)
TIBC: 265 ug/dL (ref 250–470)
UIBC: 223 ug/dL (ref 125–400)

## 2012-12-04 LAB — BASIC METABOLIC PANEL
BUN: 28 mg/dL — ABNORMAL HIGH (ref 6–23)
Chloride: 98 mEq/L (ref 96–112)
Creatinine, Ser: 1.7 mg/dL — ABNORMAL HIGH (ref 0.4–1.2)
Glucose, Bld: 117 mg/dL — ABNORMAL HIGH (ref 70–99)
Potassium: 3.7 mEq/L (ref 3.5–5.1)

## 2012-12-04 LAB — URINE CULTURE: Colony Count: NO GROWTH

## 2012-12-04 LAB — HAPTOGLOBIN: Haptoglobin: 262 mg/dL — ABNORMAL HIGH (ref 45–215)

## 2012-12-04 LAB — VITAMIN B12: Vitamin B-12: 522 pg/mL (ref 211–911)

## 2012-12-04 NOTE — Progress Notes (Deleted)
Subjective:    Patient ID: Laura Li, female    DOB: 25-Mar-1940, 72 y.o.   MRN: [id number]  HPI Patient is here for followup regarding chronic problems. History of CAD, advanced oxygen-dependent COPD, peripheral vascular disease, hypertension, dyslipidemia, osteoarthritis. He rarely takes hydrocodone for severe arthritis pains and requesting refill. His COPD is stable. He has no dyspnea at rest but does with minimal activity. Needs flu vaccine. He's had previous Pneumovax.  He complains of frequently feeling chilled. Is not a documented fever. No recent productive cough. No constipation issues. Appetite and weight are stable. No peripheral edema issues.  He is requesting home health aide. He needs some assistance with some basic ADLs.  Past Medical History  Diagnosis Date  . OA (osteoarthritis)   . History of DVT (deep vein thrombosis)   . Depression   . Hyperlipidemia   . Hypertension   . Hypothyroidism   . HX: breast cancer     melanoma  . Melanoma   . Eczema   . Arthritis   . Esophageal stricture   . GERD (gastroesophageal reflux disease)    Past Surgical History  Procedure Laterality Date  . Appendectomy  1967  . Mastectomy  1998    right  . Melanoma excision  2004    right side of face  . Mohs surgery  2005    right left  . Total abdominal hysterectomy  1992  . Tonsillectomy and adenoidectomy  1949  . Breast lumpectomy      left breast  . Breast surgery  1977    removal of calcified milk gland right breast  . Lipoma excision  1978    right breast    reports that she quit smoking about 43 years ago. She has never used smokeless tobacco. She reports that  drinks alcohol. She reports that she does not use illicit drugs. family history includes Brain cancer in her maternal aunt; Breast cancer in her cousin; Diabetes in her maternal aunt; Endometrial cancer in her mother; Heart disease in her father; Lung cancer in her paternal aunt. There is no history of Colon  cancer. Allergies  Allergen Reactions  . Niacin   . Penicillins     REACTION: GI upset      Review of Systems  Constitutional: Positive for chills. Negative for appetite change, fatigue and unexpected weight change.  Eyes: Negative for visual disturbance.  Respiratory: Positive for shortness of breath and wheezing. Negative for cough and chest tightness.   Cardiovascular: Negative for chest pain, palpitations and leg swelling.  Gastrointestinal: Negative for nausea, vomiting, abdominal pain, diarrhea and constipation.  Genitourinary: Negative for dysuria.  Musculoskeletal: Positive for back pain.  Neurological: Negative for dizziness, seizures, syncope, weakness, light-headedness and headaches.       Objective:   Physical Exam  Constitutional: She appears well-developed and well-nourished.  HENT:  Right Ear: External ear normal.  Left Ear: External ear normal.  Mouth/Throat: Oropharynx is clear and moist.  Neck: Neck supple. No thyromegaly present.  Cardiovascular: Normal rate.   Pulmonary/Chest:  Patient has somewhat diminished breath sounds throughout. Some diffuse wheezes. No retractions. No rales.  Musculoskeletal: She exhibits no edema.  Neurological: She is alert.          Assessment & Plan:  #1 advanced COPD. Oxygen dependent. Continue Advair. Flu vaccine given. He's been intolerant of Spiriva previously #2 hypertension. Stable at goal #3 hyperlipidemia. Check lipid and hepatic panel #4 complaints of fatigue and intermittent chills. No documented fever.  Check TSH 

## 2012-12-04 NOTE — Telephone Encounter (Signed)
Patient Information:  Caller Name: Solana  Phone: [phone]  Patient: Laura Li, Laura Li  Gender: Female  DOB: [birth year]  Age: 72 Years  PCP: Birdie Sons (Adults only)  Office Follow Up:  Does the office need to follow up with this patient?: No  Instructions For The Office: N/A  RN Note:  Call back paraemeters given.  Symptoms  Reason For Call & Symptoms: Pt calling regarding fever overnight and not feeling well. Triage offered and declined. Pt states she has an appt for 1430 with Dr. Caryl Never. Emergent sx r/o.  Reviewed Health History In EMR: Yes  Reviewed Medications In EMR: Yes  Reviewed Allergies In EMR: Yes  Reviewed Surgeries / Procedures: Yes  Date of Onset of Symptoms: 12/03/2012  Guideline(s) Used:  No Protocol Available - Sick Adult  Disposition Per Guideline:   See Today or Tomorrow in Office  Reason For Disposition Reached:   Nursing judgment  Advice Given:  N/A  Patient Will Follow Care Advice:  YES

## 2012-12-04 NOTE — Progress Notes (Signed)
Subjective:    Patient ID: Laura Li, female    DOB: [birth year], 72 y.o.   MRN: [id number]  HPI Patient seen with generalized complaints of malaise. Recent history is that she was diagnosed with Mnire's disease. She was placed by ENT on Dyazide. She developed increased fatigue and was seen here a few days ago. She had lab work which was significant for normocytic anemia with hemoglobin 10.8. This was new from 9 months ago. She also elevated BUN of 27 creatinine 1.7 (baseline creatinine around 0.8). Daughter thinks she was somewhat dehydrated. She has not had any other recent history of anemia  Patient had colonoscopy and EGD back in 2010. Chronic PPI use with Nexium. No recent abdominal pain. No melena. No bloody stools.  She had nonspecific temperature of 99.4 last night. None since then. Recent urine culture negative. She stopped Dyazide on her own 2 days ago  Past Medical History  Diagnosis Date  . OA (osteoarthritis)   . History of DVT (deep vein thrombosis)   . Depression   . Hyperlipidemia   . Hypertension   . Hypothyroidism   . HX: breast cancer     melanoma  . Melanoma   . Eczema   . Arthritis   . Esophageal stricture   . GERD (gastroesophageal reflux disease)    Past Surgical History  Procedure Laterality Date  . Appendectomy  1967  . Mastectomy  1998    right  . Melanoma excision  2004    right side of face  . Mohs surgery  2005    right left  . Total abdominal hysterectomy  1992  . Tonsillectomy and adenoidectomy  1949  . Breast lumpectomy      left breast  . Breast surgery  1977    removal of calcified milk gland right breast  . Lipoma excision  1978    right breast    reports that she quit smoking about 43 years ago. She has never used smokeless tobacco. She reports that  drinks alcohol. She reports that she does not use illicit drugs. family history includes Brain cancer in her maternal aunt; Breast cancer in her cousin; Diabetes in her maternal  aunt; Endometrial cancer in her mother; Heart disease in her father; Lung cancer in her paternal aunt. There is no history of Colon cancer. Allergies  Allergen Reactions  . Niacin   . Penicillins     REACTION: GI upset      Review of Systems  Constitutional: Positive for chills and fatigue. Negative for fever, appetite change and unexpected weight change.  Respiratory: Negative for cough.   Cardiovascular: Negative for chest pain.  Gastrointestinal: Negative for nausea, vomiting, abdominal pain, diarrhea, constipation and blood in stool.  Endocrine: Negative for polydipsia and polyuria.  Genitourinary: Negative for dysuria.       Objective:   Physical Exam  Constitutional: She appears well-developed and well-nourished.  HENT:  Tongue slightly dry otherwise normal  Neck: Neck supple. No thyromegaly present.  Cardiovascular: Normal rate and regular rhythm.   Pulmonary/Chest: Effort normal and breath sounds normal. No respiratory distress. She has no wheezes. She has no rales.  Abdominal: Soft. There is no tenderness.  Musculoskeletal: She exhibits no edema.  Lymphadenopathy:    She has no cervical adenopathy.          Assessment & Plan:  Patient presents with nonspecific symptoms of malaise and recent lab findings of normocytic anemia which is new along with acute renal failure with  creatinine 1.7. Question related to Dyazide. May have had some dehydration. No evidence for acute blood loss. Question hemolysis. Check labs with repeat CBC, basic metabolic panel, serum iron, TIBC, ferritin, haptoglobin, Hemoccult cards

## 2012-12-07 ENCOUNTER — Telehealth: Payer: Self-pay | Admitting: Internal Medicine

## 2012-12-07 NOTE — Telephone Encounter (Signed)
Pt was seen on 12-04-12. Pt would like blood work results

## 2012-12-08 ENCOUNTER — Other Ambulatory Visit (INDEPENDENT_AMBULATORY_CARE_PROVIDER_SITE_OTHER): Payer: Medicare Other

## 2012-12-08 DIAGNOSIS — L739 Follicular disorder, unspecified: Secondary | ICD-10-CM | POA: Diagnosis not present

## 2012-12-08 DIAGNOSIS — D509 Iron deficiency anemia, unspecified: Secondary | ICD-10-CM | POA: Diagnosis not present

## 2012-12-08 DIAGNOSIS — L821 Other seborrheic keratosis: Secondary | ICD-10-CM | POA: Diagnosis not present

## 2012-12-08 DIAGNOSIS — D485 Neoplasm of uncertain behavior of skin: Secondary | ICD-10-CM | POA: Diagnosis not present

## 2012-12-08 DIAGNOSIS — L570 Actinic keratosis: Secondary | ICD-10-CM | POA: Diagnosis not present

## 2012-12-08 DIAGNOSIS — E611 Iron deficiency: Secondary | ICD-10-CM

## 2012-12-08 DIAGNOSIS — Z85828 Personal history of other malignant neoplasm of skin: Secondary | ICD-10-CM | POA: Diagnosis not present

## 2012-12-08 LAB — HEMOCCULT GUIAC POC 1CARD (OFFICE): Card #2 Fecal Occult Blod, POC: NEGATIVE

## 2012-12-08 NOTE — Telephone Encounter (Signed)
Pt informed

## 2012-12-09 ENCOUNTER — Ambulatory Visit (INDEPENDENT_AMBULATORY_CARE_PROVIDER_SITE_OTHER): Payer: Medicare Other | Admitting: Internal Medicine

## 2012-12-09 ENCOUNTER — Encounter: Payer: Self-pay | Admitting: Internal Medicine

## 2012-12-09 VITALS — BP 124/70 | Temp 97.9°F | Ht 66.75 in | Wt 144.0 lb

## 2012-12-09 DIAGNOSIS — D649 Anemia, unspecified: Secondary | ICD-10-CM | POA: Diagnosis not present

## 2012-12-09 DIAGNOSIS — Z853 Personal history of malignant neoplasm of breast: Secondary | ICD-10-CM

## 2012-12-09 DIAGNOSIS — N289 Disorder of kidney and ureter, unspecified: Secondary | ICD-10-CM

## 2012-12-09 DIAGNOSIS — D630 Anemia in neoplastic disease: Secondary | ICD-10-CM | POA: Insufficient documentation

## 2012-12-09 LAB — CBC WITH DIFFERENTIAL/PLATELET
Basophils Absolute: 0 10*3/uL (ref 0.0–0.1)
Basophils Relative: 0.6 % (ref 0.0–3.0)
Eosinophils Relative: 4.8 % (ref 0.0–5.0)
HCT: 25 % — ABNORMAL LOW (ref 36.0–46.0)
Lymphocytes Relative: 17.6 % (ref 12.0–46.0)
Lymphs Abs: 1.2 10*3/uL (ref 0.7–4.0)
MCV: 85.5 fl (ref 78.0–100.0)
Monocytes Absolute: 0.5 10*3/uL (ref 0.1–1.0)
Neutro Abs: 4.8 10*3/uL (ref 1.4–7.7)
RBC: 2.92 Mil/uL — ABNORMAL LOW (ref 3.87–5.11)
RDW: 13.6 % (ref 11.5–14.6)
WBC: 6.9 10*3/uL (ref 4.5–10.5)

## 2012-12-09 LAB — POCT URINALYSIS DIPSTICK
Bilirubin, UA: NEGATIVE
Blood, UA: NEGATIVE
Ketones, UA: NEGATIVE
pH, UA: 6

## 2012-12-09 LAB — BASIC METABOLIC PANEL
Calcium: 9.6 mg/dL (ref 8.4–10.5)
Chloride: 103 mEq/L (ref 96–112)
GFR: 33.24 mL/min — ABNORMAL LOW (ref >60.00)
Glucose, Bld: 88 mg/dL (ref 70–99)

## 2012-12-09 NOTE — Progress Notes (Signed)
Difficult history Reviewed previous notes She complains of not feeling well- says "low grade fever 99.2). She complains of feeling "like i have the flu". Also complains of headache, ears ache and breathing is "shorter" than usual... Notes with one flight of stairs.  She notes that she started a diuretic for menniere's disease-- was taking it for 8 weeks. She started to feel poorly approximately one week later.    Reviewed past medical history, medications, social history. Reviewed surgical history. Reviewed recent laboratory values and recent medical notes.  Review of systems: She missed of fatigue, dyspnea but specifically denies rashes, arthralgias, any joint swelling. No other complaints in a complete review of systems.  Review vital signs  Exam: She appears well. HEENT exam atraumatic, normocephalic. Chest clear to auscultation. Cardiac exam S1-S2 are regular. Abdominal exam active bowel sounds, soft. Extremities no edema. Neurologic exam she is alert and oriented. No obvious motor or sensory deficits noted.

## 2012-12-10 ENCOUNTER — Telehealth: Payer: Self-pay | Admitting: Internal Medicine

## 2012-12-10 ENCOUNTER — Other Ambulatory Visit: Payer: Self-pay | Admitting: Internal Medicine

## 2012-12-10 ENCOUNTER — Ambulatory Visit (INDEPENDENT_AMBULATORY_CARE_PROVIDER_SITE_OTHER)
Admission: RE | Admit: 2012-12-10 | Discharge: 2012-12-10 | Disposition: A | Discharge status: Home / Self Care | Payer: Medicare Other | Source: Ambulatory Visit | Attending: Internal Medicine | Admitting: Internal Medicine

## 2012-12-10 ENCOUNTER — Ambulatory Visit: Payer: Medicare Other

## 2012-12-10 ENCOUNTER — Ambulatory Visit
Admission: RE | Admit: 2012-12-10 | Discharge: 2012-12-10 | Disposition: A | Discharge status: Home / Self Care | Payer: Medicare Other | Source: Ambulatory Visit | Attending: Internal Medicine | Admitting: Internal Medicine

## 2012-12-10 ENCOUNTER — Ambulatory Visit (HOSPITAL_BASED_OUTPATIENT_CLINIC_OR_DEPARTMENT_OTHER): Payer: Medicare Other | Admitting: Internal Medicine

## 2012-12-10 VITALS — BP 134/65 | HR 82 | Temp 97.0°F | Resp 18 | Ht 66.0 in | Wt 143.2 lb

## 2012-12-10 DIAGNOSIS — H8109 Meniere's disease, unspecified ear: Secondary | ICD-10-CM

## 2012-12-10 DIAGNOSIS — D649 Anemia, unspecified: Secondary | ICD-10-CM | POA: Diagnosis not present

## 2012-12-10 DIAGNOSIS — J438 Other emphysema: Secondary | ICD-10-CM | POA: Diagnosis not present

## 2012-12-10 DIAGNOSIS — N179 Acute kidney failure, unspecified: Secondary | ICD-10-CM

## 2012-12-10 DIAGNOSIS — R0602 Shortness of breath: Secondary | ICD-10-CM

## 2012-12-10 DIAGNOSIS — N19 Unspecified kidney failure: Secondary | ICD-10-CM | POA: Diagnosis not present

## 2012-12-10 DIAGNOSIS — N289 Disorder of kidney and ureter, unspecified: Secondary | ICD-10-CM

## 2012-12-10 DIAGNOSIS — R7989 Other specified abnormal findings of blood chemistry: Secondary | ICD-10-CM

## 2012-12-10 LAB — LACTATE DEHYDROGENASE: LDH: 166 U/L (ref 94–250)

## 2012-12-10 LAB — IBC PANEL: Saturation Ratios: 19.7 % — ABNORMAL LOW (ref 20.0–50.0)

## 2012-12-10 LAB — ANA: Anti Nuclear Antibody(ANA): NEGATIVE

## 2012-12-10 NOTE — Telephone Encounter (Signed)
gv and printed appt sched and avs for pt for OCT...lab already closed pt comming back tomorrow

## 2012-12-10 NOTE — Addendum Note (Signed)
Addended by: Alfred Levins D on: 12/10/2012 10:49 AM   Modules accepted: Orders

## 2012-12-10 NOTE — Assessment & Plan Note (Signed)
Interesting medical situation. Patient has new onset renal insufficiency and anemia. She needs further evaluation. I'll check CBC. Will also check sedimentation rate. She needs evaluation for iron deficiency anemia as well.

## 2012-12-10 NOTE — Assessment & Plan Note (Signed)
New-onset renal insufficiency. Unclear etiology. Renal insufficiency could be the cause of her anemia but she needs evaluation for both. Will check renal ultrasound. We'll also screen for rheumatologic disease. Also check for monoclonal proteins.

## 2012-12-10 NOTE — Telephone Encounter (Signed)
C/D 12/10/12 for appt 12/10/12

## 2012-12-10 NOTE — Telephone Encounter (Signed)
S/w pt and gve np appt 10/02  @ 3 w/Dr. Rosie Fate Referring Dr. Cato Mulligan Dx-Anemia/Abn CBC

## 2012-12-10 NOTE — Addendum Note (Signed)
Addended by: Alfred Levins D on: 12/10/2012 11:26 AM   Modules accepted: Orders

## 2012-12-10 NOTE — Patient Instructions (Signed)

## 2012-12-11 ENCOUNTER — Other Ambulatory Visit (HOSPITAL_BASED_OUTPATIENT_CLINIC_OR_DEPARTMENT_OTHER): Payer: Medicare Other

## 2012-12-11 DIAGNOSIS — D649 Anemia, unspecified: Secondary | ICD-10-CM | POA: Diagnosis not present

## 2012-12-11 DIAGNOSIS — N289 Disorder of kidney and ureter, unspecified: Secondary | ICD-10-CM | POA: Diagnosis not present

## 2012-12-11 LAB — CBC & DIFF AND RETIC
Basophils Absolute: 0 10*3/uL (ref 0.0–0.1)
EOS%: 6.9 % (ref 0.0–7.0)
HCT: 27.2 % — ABNORMAL LOW (ref 34.8–46.6)
HGB: 9 g/dL — ABNORMAL LOW (ref 11.6–15.9)
MCH: 29.4 pg (ref 25.1–34.0)
MCV: 88.9 fL (ref 79.5–101.0)
MONO#: 0.4 10*3/uL (ref 0.1–0.9)
MONO%: 6.8 % (ref 0.0–14.0)
NEUT%: 63.2 % (ref 38.4–76.8)
Retic Ct Abs: 80.17 10*3/uL (ref 33.70–90.70)
lymph#: 1.4 10*3/uL (ref 0.9–3.3)

## 2012-12-11 LAB — COMPREHENSIVE METABOLIC PANEL (CC13)
AST: 16 U/L (ref 5–34)
Alkaline Phosphatase: 88 U/L (ref 40–150)
BUN: 24.4 mg/dL (ref 7.0–26.0)
Creatinine: 1.7 mg/dL — ABNORMAL HIGH (ref 0.6–1.1)
Glucose: 107 mg/dl (ref 70–140)
Sodium: 143 mEq/L (ref 136–145)
Total Bilirubin: 0.37 mg/dL (ref 0.20–1.20)
Total Protein: 8.5 g/dL — ABNORMAL HIGH (ref 6.4–8.3)

## 2012-12-11 LAB — IMMUNOFIXATION ELECTROPHORESIS
IgG (Immunoglobin G), Serum: 1750 mg/dL — ABNORMAL HIGH (ref 690–1700)
Total Protein, Serum Electrophoresis: 7.6 g/dL (ref 6.0–8.3)

## 2012-12-11 LAB — LACTATE DEHYDROGENASE (CC13): LDH: 177 U/L (ref 125–245)

## 2012-12-12 LAB — BILIRUBIN, DIRECT: Bilirubin, Direct: 0.1 mg/dL (ref 0.0–0.3)

## 2012-12-12 LAB — DIRECT ANTIGLOBULIN TEST (NOT AT ARMC)
DAT (Complement): NEGATIVE
DAT IgG: NEGATIVE

## 2012-12-13 NOTE — Progress Notes (Signed)
[facility] [phone]   [phone]  NEW PATIENT EVALUATION   Name: Laura Li Date: 12/13/2012 MRN: [id number] DOB: 10-Feb-1941  PCP: Judie Petit, MD   REFERRING PHYSICIAN: Swords, Valetta Mole, MD  REASON FOR REFERRAL: Anemia NOS    HISTORY OF PRESENT ILLNESS:Laura Li is a 72 y.o. female with a recent history of Meniere's disease managed with dyazide (HCTZ/Triameterene) who is presenting for further evaluation of her anemia.  Of note, she recently developed an acute kidney injury.  Today, she is accompanied by her daughter.  She reports starting Dyazide for Meniere's disease about six to eight weeks ago.  She states she has "felt off" since taking it with increasing lethargy and dyspnea when going up stairs.  She denies yellow eyes or early satiety or bleeding  Or picca.  She denies melena or hematochezia.  She had a colonoscopy and EGD in 2010 and has been of PPI.  She stopped dyazide on 12/04/2012.  She denies skin rashes or joint arthralgias.  She denies bone pain or recurrent infections.  She denies using any additional new medications.    PAST MEDICAL HISTORY:  has a past medical history of OA (osteoarthritis); History of DVT (deep vein thrombosis); Depression; Hyperlipidemia; Hypertension; Hypothyroidism; breast cancer; Melanoma; Eczema; Arthritis; Esophageal stricture; and GERD (gastroesophageal reflux disease).     PAST SURGICAL HISTORY: Past Surgical History  Procedure Laterality Date  . Appendectomy  1967  . Mastectomy  1998    right  . Melanoma excision  2004    right side of face  . Mohs surgery  2005    right left  . Total abdominal hysterectomy  1992  . Tonsillectomy and adenoidectomy  1949  . Breast lumpectomy      left breast  . Breast surgery  1977    removal of calcified milk gland right breast  . Lipoma excision  1978    right breast     CURRENT MEDICATIONS: has a current medication list which includes the  following prescription(s): acetaminophen, atorvastatin, b complex-c-folic acid, calcium-vitamin d, fexofenadine, ipratropium, levothyroxine, nadolol, nexium, and venlafaxine xr.   ALLERGIES: Niacin and Penicillins   SOCIAL HISTORY:  reports that she quit smoking about 43 years ago. She has never used smokeless tobacco. She reports that  drinks alcohol. She reports that she does not use illicit drugs.   FAMILY HISTORY: family history includes Brain cancer in her maternal aunt; Breast cancer in her cousin; Diabetes in her maternal aunt; Endometrial cancer in her mother; Heart disease in her father; Lung cancer in her paternal aunt. There is no history of Colon cancer.   LABORATORY DATA:  CBC    Component Value Date/Time   WBC 6.4 12/11/2012 1026   WBC 6.9 12/09/2012 1022   RBC 3.06* 12/11/2012 1026   RBC 2.92* 12/09/2012 1022   HGB 9.0* 12/11/2012 1026   HGB 8.9 Repeated and verified X2.* 12/09/2012 1022   HCT 27.2* 12/11/2012 1026   HCT 25.0 Repeated and verified X2.* 12/09/2012 1022   PLT 265 12/11/2012 1026   PLT 293.0 12/09/2012 1022   MCV 88.9 12/11/2012 1026   MCV 85.5 12/09/2012 1022   MCH 29.4 12/11/2012 1026   MCHC 33.1 12/11/2012 1026   MCHC 35.4 12/09/2012 1022   RDW 14.0 12/11/2012 1026   RDW 13.6 12/09/2012 1022   LYMPHSABS 1.4 12/11/2012 1026   LYMPHSABS 1.2 12/09/2012 1022   MONOABS 0.4 12/11/2012 1026   MONOABS 0.5 12/09/2012  1022   EOSABS 0.4 12/11/2012 1026   EOSABS 0.3 12/09/2012 1022   BASOSABS 0.0 12/11/2012 1026   BASOSABS 0.0 12/09/2012 1022    CMP     Component Value Date/Time   NA 143 12/11/2012 1026   NA 140 12/09/2012 1022   K 4.4 12/11/2012 1026   K 4.0 12/09/2012 1022   CL 103 12/09/2012 1022   CO2 29 12/11/2012 1026   CO2 30 12/09/2012 1022   GLUCOSE 107 12/11/2012 1026   GLUCOSE 88 12/09/2012 1022   GLUCOSE 88 02/06/2006 1059   BUN 24.4 12/11/2012 1026   BUN 24* 12/09/2012 1022   CREATININE 1.7* 12/11/2012 1026   CREATININE 1.6* 12/09/2012 1022   CALCIUM 10.1  12/11/2012 1026   CALCIUM 9.6 12/09/2012 1022   PROT 8.5* 12/11/2012 1026   PROT 8.3 02/24/2012 1725   ALBUMIN 3.5 12/11/2012 1026   ALBUMIN 4.6 02/24/2012 1725   AST 16 12/11/2012 1026   AST 24 02/24/2012 1725   ALT 12 12/11/2012 1026   ALT 20 02/24/2012 1725   ALKPHOS 88 12/11/2012 1026   ALKPHOS 75 02/24/2012 1725   BILITOT 0.37 12/11/2012 1026   BILITOT 0.6 02/24/2012 1725   GFRNONAA 74.11 09/18/2009 1016   GFRAA 91 12/10/2007 1118   Iron/TIBC/Ferritin    Component Value Date/Time   IRON 50 12/10/2012 1746   TIBC 265 12/04/2012 1507   FERRITIN 236.5 12/10/2012 1746   Results for Laura Li, Laura Li (MRN [id number]) as of 12/13/2012 09:57  Ref. Range 12/11/2012 10:26  LDH Latest Range: 125-245 U/L 177   Results for Laura Li, Laura Li (MRN [id number]) as of 12/13/2012 09:57  Ref. Range 12/11/2012 10:26  Retic % Latest Range: 0.70-2.10 % 2.62 (H)  Retic Ct Abs Latest Range: 33.70-90.70 10e3/uL 80.17  Immature Retic Fract Latest Range: 1.60-10.00 % 9.20   Results for Laura Li, Laura Li (MRN [id number]) as of 12/13/2012 09:57  Ref. Range 12/11/2012 10:26  DAT (Complement) Latest Range: NEGATIVE  NEG  DAT IgG Latest Range: NEGATIVE  NEG  Results for Laura Li, Laura Li (MRN [id number]) as of 12/13/2012 09:57  Ref. Range 12/09/2012 10:22  Sed Rate Latest Range: 0-22 mm/hr 120 Repeated and verified X2. (H)   Results for Laura Li, Laura Li (MRN [id number]) as of 12/13/2012 09:57  Ref. Range 12/09/2012 10:22  IgG (Immunoglobin G), Serum Latest Range: 819 028 6270 mg/dL 4742 (H)  IgA Latest Range: 69-380 mg/dL 595 (H)  Results for Laura Li, Laura Li (MRN [id number]) as of 12/13/2012 09:57  Ref. Range 12/02/2012 13:38 12/02/2012 14:13  Color, UA No range found yellow   Specific Gravity, UA No range found 1.010   pH, UA No range found 6.0   Glucose No range found n   Bilirubin, UA No range found n   Ketones, UA No range found n   Clarity, UA No range found clear   Protein, UA No range found n   Urobilinogen, UA No  range found 0.2   Nitrite, UA No range found n   Leukocytes, UA No range found moderate (2+)   RBC, UA No range found n   Organism ID, Bacteria No range found  NO GROWTH  URINE CULTURE No range found  Rpt   Results for Laura Li, Laura Li (MRN [id number]) as of 12/13/2012 09:57  Ref. Range 12/08/2012 11:05 12/08/2012 11:05 12/08/2012 11:05  Fecal Occult Blood, POC No range found Negative Negative Negative   Results for Laura Li, Laura Li (MRN [id number]) as of 12/13/2012 09:57  Ref. Range 02/24/2012  17:25 12/01/2012 11:38  BUN Latest Range: 7.0-26.0 mg/dL 16 27 (H)  Creatinine Latest Range: 0.4-1.2 mg/dL 0.8 1.7 (H)   RADIOGRAPHY: Dg Chest 2 View  12/10/2012   CLINICAL DATA:  Shortness of breath, fever for 2 weeks, hypertension, history breast cancer, melanoma, former smoker  EXAM: CHEST  2 VIEW  COMPARISON:  None  FINDINGS: Normal heart size, mediastinal contours, and pulmonary vascularity.  Lungs emphysematous but clear.  No pleural effusion or pneumothorax.  Bones demineralized.  Post right mastectomy.  IMPRESSION: No acute abnormalities.   Electronically Signed   By: Ulyses Southward M.D.   On: 12/10/2012 17:35   US Renal  12/10/2012   CLINICAL DATA:  New onset renal failure  EXAM: RENAL/URINARY TRACT ULTRASOUND COMPLETE  COMPARISON:  None.  FINDINGS: Right Kidney  Length: 11.0 cm. Echogenicity within normal limits. No mass or hydronephrosis visualized.  Left Kidney  Length: 11.0 cm. Echogenicity within normal limits. No mass or hydronephrosis visualized.  Bladder:  Within normal limits.  IMPRESSION: Negative renal ultrasound.   Electronically Signed   By: Charline Bills M.D.   On: 12/10/2012 15:17       REVIEW OF SYSTEMS:  Constitutional: Denies fevers, chills or abnormal weight loss; + general malaise Eyes: Denies blurriness of vision Ears, nose, mouth, throat, and face: Denies mucositis or sore throat Respiratory: Denies cough,+ dyspnea on exertion or wheezes Cardiovascular: Denies palpitation,  chest discomfort or lower extremity swelling Gastrointestinal:  Denies nausea, heartburn or change in bowel habits Skin: Denies abnormal skin rashes Lymphatics: Denies new lymphadenopathy or easy bruising Neurological:Denies numbness, tingling or new weaknesses Behavioral/Psych: Mood is stable, no new changes  All other systems were reviewed with the patient and are negative.  PHYSICAL EXAM:  height is 5\' 6"  (1.676 m) and weight is 143 lb 3.2 oz (64.955 kg). Her oral temperature is 97 F (36.1 C). Her blood pressure is 134/65 and her pulse is 82. Her respiration is 18 and oxygen saturation is 100%.    GENERAL:alert, no distress and comfortable SKIN: skin color, texture, turgor are normal, no rashes or significant lesions EYES: normal, Conjunctiva are pink and non-injected, sclera clear OROPHARYNX:no exudate, no erythema and lips, buccal mucosa, and tongue normal  NECK: supple, thyroid normal size, non-tender, without nodularity LYMPH:  no palpable lymphadenopathy in the cervical, axillary or inguinal LUNGS: clear to auscultation and percussion with normal breathing effort HEART: regular rate & rhythm and no murmurs and no lower extremity edema ABDOMEN:abdomen soft, non-tender and normal bowel sounds Musculoskeletal:no cyanosis of digits and no clubbing  NEURO: alert & oriented x 3 with fluent speech, no focal motor/sensory deficits   IMPRESSION: Laura Li is a 72 y.o. 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  ESR is highly elevated.  Time course and history points away from chronic illnesses such as autoimmune (negative ANA) diseases.  Multiple myeloma (normal calcium, no bone pain; however slightly elevated protein, increase ESR with renal dysfunction and anemia) will also be considered.[name]    PLAN: 1.  Anemia NOS.  --Her Hemoglobin was normal  9 months ago at 13.4;  One week ago it was 10.8 (9.0 today).  Normal WBCs and platlets counts points away from a primary bone marrow problem.  She did have slightly high eosinophils (5.7%) one a week ago which is seen in allergic acute interstitial nephritis (AIN).    -  Although her Reticulocyte index is greater than 2 but this  is not appropriate to the degree of her anemia.  Here normal LDH, indirect bilirubin and haptoglobin points against hemolysis;  She also denies recent acute blood loss supported with three negative FOBTs and colonoscopy 4 years ago. She has no splenomegaly on my physical exam. In addition, she has a negative DAT.   - Her anemia appears to be normocytic and likely an anemia of chronic inflammation.  Treatment will likely be treating underlying disease +/- erythropoietin, (especially if Epo less than 500 mU/mL). We will check UPEP (bence jones proteins), kappa/lambda free light chains on her return visit.    2. Acute kidney injury likely secondary to Dyazide (HCTZ/Triamterene).  GFR on 02/2012 was 73.61 but on 12/01/2012 was 31.44.    --Renal u/s negative for obstruction and otherwise normal; Etiology likely intrinsic based on her history (AIN vs acute tubular necrosis) with BUN/Creatinine less than 20. She was counseled to avoid further nephrotoxins and continue adequate hydration.  ? Steroids if AIN.  --If persists and exact etiology remains unclear, she will likely need referral to nephrology for consideration of renal biopsy.    3. Meniere's Disease --Counseled to discontinue Dyazide.    4. Follow-up. --Patient was counseled to follow up a few weeks for a repeat CBC.    All questions were answered. The patient knows to call the clinic with any problems, questions or concerns. We can certainly see the patient much sooner if necessary.  I spent 25 minutes counseling the patient face to face. The total time spent in the appointment was 45 minutes.    Chelsea Pedretti,  MD 12/10/2012  6:00 PM

## 2012-12-14 ENCOUNTER — Telehealth: Payer: Self-pay | Admitting: Internal Medicine

## 2012-12-14 NOTE — Telephone Encounter (Signed)
I provided the results of her labs.   She will follow up with [place] on Friday to discuss more in depth.  She reports her symptoms remain stable overall.

## 2012-12-17 ENCOUNTER — Other Ambulatory Visit: Payer: Self-pay | Admitting: Internal Medicine

## 2012-12-17 DIAGNOSIS — D649 Anemia, unspecified: Secondary | ICD-10-CM

## 2012-12-17 DIAGNOSIS — N289 Disorder of kidney and ureter, unspecified: Secondary | ICD-10-CM

## 2012-12-18 ENCOUNTER — Ambulatory Visit (HOSPITAL_BASED_OUTPATIENT_CLINIC_OR_DEPARTMENT_OTHER): Payer: Medicare Other | Admitting: Internal Medicine

## 2012-12-18 ENCOUNTER — Telehealth: Payer: Self-pay | Admitting: Internal Medicine

## 2012-12-18 ENCOUNTER — Other Ambulatory Visit (HOSPITAL_BASED_OUTPATIENT_CLINIC_OR_DEPARTMENT_OTHER): Payer: Medicare Other | Admitting: Lab

## 2012-12-18 VITALS — BP 134/69 | HR 67 | Temp 97.0°F | Resp 18 | Ht 66.0 in | Wt 144.9 lb

## 2012-12-18 DIAGNOSIS — N179 Acute kidney failure, unspecified: Secondary | ICD-10-CM | POA: Diagnosis not present

## 2012-12-18 DIAGNOSIS — D649 Anemia, unspecified: Secondary | ICD-10-CM

## 2012-12-18 DIAGNOSIS — H8109 Meniere's disease, unspecified ear: Secondary | ICD-10-CM

## 2012-12-18 DIAGNOSIS — D619 Aplastic anemia, unspecified: Secondary | ICD-10-CM

## 2012-12-18 DIAGNOSIS — N289 Disorder of kidney and ureter, unspecified: Secondary | ICD-10-CM

## 2012-12-18 LAB — CBC WITH DIFFERENTIAL/PLATELET
Basophils Absolute: 0.1 10*3/uL (ref 0.0–0.1)
EOS%: 6.1 % (ref 0.0–7.0)
HGB: 9.6 g/dL — ABNORMAL LOW (ref 11.6–15.9)
MCH: 30.4 pg (ref 25.1–34.0)
MCV: 88.1 fL (ref 79.5–101.0)
MONO%: 7.3 % (ref 0.0–14.0)
NEUT#: 4.5 10*3/uL (ref 1.5–6.5)
NEUT%: 62.4 % (ref 38.4–76.8)
RBC: 3.17 10*6/uL — ABNORMAL LOW (ref 3.70–5.45)
RDW: 14.5 % (ref 11.2–14.5)
lymph#: 1.7 10*3/uL (ref 0.9–3.3)

## 2012-12-18 LAB — BASIC METABOLIC PANEL (CC13)
Anion Gap: 10 mEq/L (ref 3–11)
Calcium: 10.3 mg/dL (ref 8.4–10.4)
Chloride: 105 mEq/L (ref 98–109)
Potassium: 4 mEq/L (ref 3.5–5.1)
Sodium: 141 mEq/L (ref 136–145)

## 2012-12-18 NOTE — Patient Instructions (Signed)
Acute Kidney Injury Acute kidney injury is a disease in which there is sudden (acute) damage to the kidneys. The kidneys are 2 organs that lie on either side of the spine between the middle of the back and the front of the abdomen. The kidneys:  Remove wastes and extra water from the blood.   Produce important hormones. These help keep bones strong, regulate blood pressure, and help create red blood cells.   Balance the fluids and chemicals in the blood and tissues. A small amount of kidney damage may not cause problems, but a large amount of damage may make it difficult or impossible for the kidneys to work the way they should. Acute kidney injury may develop into long-lasting (chronic) kidney disease. It may also develop into a life-threatening disease called end-stage kidney disease. Acute kidney injury can get worse very quickly, so it should be treated right away. Early treatment may prevent other kidney diseases from developing.  CAUSES   A problem with blood flow to the kidneys. This may be caused by:   Blood loss.   Heart disease.   Severe burns.   Liver disease.  Direct damage to the kidneys. This may be caused by:  Some medicines.   A kidney infection.   Poisoning or consuming toxic substances.   A surgical wound.   A blow to the kidney area.   A problem with urine flow. This may be caused by:   Cancer.   Kidney stones.   An enlarged prostate. SYMPTOMS   Swelling (edema) of the legs, ankles, or feet.   Tiredness (lethargy).   Nausea or vomiting.   Confusion.   Problems with urination, such as:   Painful or burning feeling during urination.   Decreased urine production.   Frequent accidents in children who are potty trained.   Bloody urine.   Muscle twitches and cramps.   Shortness of breath.   Seizures.   Chest pain or pressure. Sometimes, no symptoms are present. DIAGNOSIS Acute kidney injury may be detected  and diagnosed by tests, including blood, urine, imaging, or kidney biopsy tests.  TREATMENT Treatment of acute kidney injury varies depending on the cause and severity of the kidney damage. In mild cases, no treatment may be needed. The kidneys may heal on their own. If acute kidney injury is more severe, your caregiver will treat the cause of the kidney damage, help the kidneys heal, and prevent complications from occurring. Severe cases may require a procedure to remove toxic wastes from the body (dialysis) or surgery to repair kidney damage. Surgery may involve:   Repair of a torn kidney.   Removal of an obstruction. Most of the time, you will need to stay overnight at the hospital.  HOME CARE INSTRUCTIONS:  Follow your prescribed diet.  Only take over-the-counter or prescription medicines as directed by your caregiver.  Do not take any new medicines (prescription, over-the-counter, or nutritional supplements) unless approved by your caregiver. Many medicines can worsen your kidney damage or need to have the dose adjusted.   Keep all follow-up appointments as directed by your caregiver.  Observe your condition to make sure you are healing as expected. SEEK IMMEDIATE MEDICAL CARE IF:  You are feeling ill or have severe pain in the back or side.   Your symptoms return or you have new symptoms.  You have any symptoms of end-stage kidney disease. These include:   Persistent itchiness.   Loss of appetite.   Headaches.   Abnormally dark   or light skin.  Numbness in the hands or feet.   Easy bruising.   Frequent hiccups.   Menstruation stops.   You have a fever.  You have increased urine production.  You have pain or bleeding when urinating. MAKE SURE YOU:   Understand these instructions.  Will watch your condition.  Will get help right away if you are not doing well or get worse Document Released: 09/10/2010 Document Revised: 11/20/2011 Document  Reviewed: 10/25/2011 [facility] Patient Information 2014 [place], [place]. Anemia, Frequently Asked Questions WHAT ARE THE SYMPTOMS OF ANEMIA?  Headache.  Difficulty thinking.  Fatigue.  Shortness of breath.  Weakness.  Rapid heartbeat. AT WHAT POINT ARE PEOPLE CONSIDERED ANEMIC?  This varies with gender and age.   Both hemoglobin (Hgb) and hematocrit values are used to define anemia. These lab values are obtained from a complete blood count (CBC) test. This is performed at a caregiver's office.  The normal range of hemoglobin values for adult men is 14.0 g/dL to 16.1 g/dL. For nonpregnant women, values are 12.3 g/dL to 09.6 g/dL.  The World Health Organization defines anemia as less than 12 g/dL for nonpregnant women and less than 13 g/dL for men.  For adult males, the average normal hematocrit is 46%, and the range is 40% to 52%.  For adult females, the average normal hematocrit is 41%, and the range is 35% to 47%.  Values that fall below the lower limits can be a sign of anemia and should have further checking (evaluation). GROUPS OF PEOPLE WHO ARE AT RISK FOR DEVELOPING ANEMIA INCLUDE:   Infants who are breastfed or taking a formula that is not fortified with iron.  Children going through a rapid growth spurt. The iron available can not keep up with the needs for a red cell mass which must grow with the child.  Women in childbearing years. They need iron because of blood loss during menstruation.  Pregnant women. The growing fetus creates a high demand for iron.  People with ongoing gastrointestinal blood loss are at risk of developing iron deficiency.  Individuals with leukemia or cancer who must receive chemotherapy or radiation to treat their disease. The drugs or radiation used to treat these diseases often decreases the bone marrow's ability to make cells of all classes. This includes red blood cells, white blood cells, and platelets.  Individuals with chronic  inflammatory conditions such as rheumatoid arthritis or chronic infections.  The elderly. ARE SOME TYPES OF ANEMIA INHERITED?   Yes, some types of anemia are due to inherited or genetic defects.  Sickle cell anemia. This occurs most often in people of African, African American, and Mediterranean descent.  Thalassemia (or Cooley's anemia). This type is found in people of Mediterranean and Southeast Asian descent. These types of anemia are common.  [place]. This is rare. CAN CERTAIN MEDICATIONS CAUSE A PERSON TO BECOME ANEMIC?  Yes. For example, drugs to fight cancer (chemotherapeutic agents) often cause anemia. These drugs can slow the bone marrow's ability to make red blood cells. If there are not enough red blood cells, the body does not get enough oxygen. WHAT HEMATOCRIT LEVEL IS REQUIRED TO DONATE BLOOD?  The lower limit of an acceptable hematocrit for blood donors is 38%. If you have a low hematocrit value, you should schedule an appointment with your caregiver. ARE BLOOD TRANSFUSIONS COMMONLY USED TO CORRECT ANEMIA, AND ARE THEY DANGEROUS?  They are used to treat anemia as a last resort. Your caregiver will find the cause  of the anemia and correct it if possible. Most blood transfusions are given because of excessive bleeding at the time of surgery, with trauma, or because of bone marrow suppression in patients with cancer or leukemia on chemotherapy. Blood transfusions are safer than ever before. We also know that blood transfusions affect the immune system and may increase certain risks. There is also a concern for human error. In 1/16,000 transfusions, a patient receives a transfusion of blood that is not matched with his or her blood type.  WHAT IS IRON DEFICIENCY ANEMIA AND CAN I CORRECT IT BY CHANGING MY DIET?  Iron is an essential part of hemoglobin. Without enough hemoglobin, anemia develops and the body does not get the right amount of oxygen. Iron deficiency anemia develops after  the body has had a low level of iron for a long time. This is either caused by blood loss, not taking in or absorbing enough iron, or increased demands for iron (like pregnancy or rapid growth).  Foods from animal origin such as beef, chicken, and pork, are good sources of iron. Be sure to have one of these foods at each meal. Vitamin C helps your body absorb iron. Foods rich in Vitamin C include citrus, bell pepper, strawberries, spinach and cantaloupe. In some cases, iron supplements may be needed in order to correct the iron deficiency. In the case of poor absorption, extra iron may have to be given directly into the vein through a needle (intravenously). I HAVE BEEN DIAGNOSED WITH IRON DEFICIENCY ANEMIA AND MY CAREGIVER PRESCRIBED IRON SUPPLEMENTS. HOW LONG WILL IT TAKE FOR MY BLOOD TO BECOME NORMAL?  It depends on the degree of anemia at the beginning of treatment. Most people with mild to moderate iron deficiency, anemia will correct the anemia over a period of 2 to 3 months. But after the anemia is corrected, the iron stored by the body is still low. Caregivers often suggest an additional 6 months of oral iron therapy once the anemia has been reversed. This will help prevent the iron deficiency anemia from quickly happening again. Non-anemic adult males should take iron supplements only under the direction of a doctor, too much iron can cause liver damage.  MY HEMOGLOBIN IS 9 G/DL AND I AM SCHEDULED FOR SURGERY. SHOULD I POSTPONE THE SURGERY?  If you have Hgb of 9, you should discuss this with your caregiver right away. Many patients with similar hemoglobin levels have had surgery without problems. If minimal blood loss is expected for a minor procedure, no treatment may be necessary.  If a greater blood loss is expected for more extensive procedures, you should ask your caregiver about being treated with erythropoietin and iron. This is to accelerate the recovery of your hemoglobin to a normal level  before surgery. An anemic patient who undergoes high-blood-loss surgery has a greater risk of surgical complications and need for a blood transfusion, which also carries some risk.  I HAVE BEEN TOLD THAT HEAVY MENSTRUAL PERIODS CAUSE ANEMIA. IS THERE ANYTHING I CAN DO TO PREVENT THE ANEMIA?  Anemia that results from heavy periods is usually due to iron deficiency. You can try to meet the increased demands for iron caused by the heavy monthly blood loss by increasing the intake of iron-rich foods. Iron supplements may be required. Discuss your concerns with your caregiver. WHAT CAUSES ANEMIA DURING PREGNANCY?  Pregnancy places major demands on the body. The mother must meet the needs of both her body and her growing baby. The body needs  enough iron and folate to make the right amount of red blood cells. To prevent anemia while pregnant, the mother should stay in close contact with her caregiver.  Be sure to eat a diet that has foods rich in iron and folate like liver and dark green leafy vegetables. Folate plays an important role in the normal development of a baby's spinal cord. Folate can help prevent serious disorders like spina bifida. If your diet does not provide adequate nutrients, you may want to talk with your caregiver about nutritional supplements.  WHAT IS THE RELATIONSHIP BETWEEN FIBROID TUMORS AND ANEMIA IN WOMEN?  The relationship is usually caused by the increased menstrual blood loss caused by fibroids. Good iron intake may be required to prevent iron deficiency anemia from developing.  Document Released: 10/04/2003 Document Revised: 05/20/2011 Document Reviewed: 03/20/2010 [facility] Patient Information 2014 [place], [place].

## 2012-12-18 NOTE — Progress Notes (Signed)
[facility] OFFICE PROGRESS NOTE  Judie Petit, MD [address] [place] [address]  DIAGNOSIS: Anemia  Renal insufficiency  Chief Complaint  Patient presents with  . Anemia    CURRENT THERAPY:  Observation  INTERVAL HISTORY: SOLAE Li is a 72 y.o. female with a recent history of Meniere's disease managed with dyazide (HCTZ/Triameterene) who is presenting for further evaluation of her anemia. Of note, she recently developed an acute kidney injury. Today, she is accompanied by her daughter. She reports starting Dyazide for Meniere's disease about six to eight weeks ago. She states she has "felt off" since taking it with increasing lethargy and dyspnea when going up stairs. She denies yellow eyes or early satiety or bleeding Or picca. She denies melena or hematochezia. She had a colonoscopy and EGD in 2010 and has been of PPI. She stopped dyazide on 12/04/2012. She denies skin rashes or joint arthralgias. She denies bone pain or recurrent infections. She denies using any additional new medications.   Today, she reports with her daughter.  She reports slight improvement in her dyspnea on exertion.   She denies any chest pain.  She does endorse a history of GERD for which she takes protonix.   MEDICAL HISTORY: Past Medical History  Diagnosis Date  . OA (osteoarthritis)   . History of DVT (deep vein thrombosis)   . Depression   . Hyperlipidemia   . Hypertension   . Hypothyroidism   . HX: breast cancer     melanoma  . Melanoma   . Eczema   . Arthritis   . Esophageal stricture   . GERD (gastroesophageal reflux disease)     INTERIM HISTORY: has HYPOTHYROIDISM; HYPERLIPIDEMIA; DEPRESSION; HYPERTENSION; ALLERGIC  RHINITIS; OSTEOPOROSIS; BREAST CANCER, HX OF; DVT, HX OF; Renal insufficiency; and Anemia on her problem list.    ALLERGIES:  is allergic to niacin and penicillins.  MEDICATIONS: has a current medication list which includes the  following prescription(s): acetaminophen, atorvastatin, b complex-c-folic acid, calcium-vitamin d, fexofenadine, ipratropium, levothyroxine, nadolol, nexium, and venlafaxine xr.  SURGICAL HISTORY:  Past Surgical History  Procedure Laterality Date  . Appendectomy  1967  . Mastectomy  1998    right  . Melanoma excision  2004    right side of face  . Mohs surgery  2005    right left  . Total abdominal hysterectomy  1992  . Tonsillectomy and adenoidectomy  1949  . Breast lumpectomy      left breast  . Breast surgery  1977    removal of calcified milk gland right breast  . Lipoma excision  1978    right breast    REVIEW OF SYSTEMS:   Constitutional: Denies fevers, chills or abnormal weight loss Eyes: Denies blurriness of vision Ears, nose, mouth, throat, and face: Denies mucositis or sore throat Respiratory: Denies cough, dyspnea or wheezes Cardiovascular: Denies palpitation, chest discomfort or lower extremity swelling Gastrointestinal:  Denies nausea, heartburn or change in bowel habits Skin: Denies abnormal skin rashes Lymphatics: Denies new lymphadenopathy or easy bruising Neurological:Denies numbness, tingling or new weaknesses Behavioral/Psych: Mood is stable, no new changes  All other systems were reviewed with the patient and are negative.  PHYSICAL EXAMINATION: ECOG PERFORMANCE STATUS: 1 - Symptomatic but completely ambulatory  Blood pressure 134/69, pulse 67, temperature 97 F (36.1 C), temperature source Oral, resp. rate 18, height 5\' 6"  (1.676 m), weight 144 lb 14.4 oz (65.726 kg).  GENERAL:alert, no distress and comfortable SKIN: skin color, texture,  turgor are normal, no rashes or significant lesions EYES: normal, Conjunctiva are pink and non-injected, sclera clear OROPHARYNX:no exudate, no erythema and lips, buccal mucosa, and tongue normal  NECK: supple, thyroid normal size, non-tender, without nodularity LYMPH:  no palpable lymphadenopathy in the cervical,  axillary or supraclavicular LUNGS: clear to auscultation and percussion with normal breathing effort HEART: regular rate & rhythm and no murmurs and no lower extremity edema ABDOMEN:abdomen soft, non-tender and normal bowel sounds Musculoskeletal:no cyanosis of digits and no clubbing  NEURO: alert & oriented x 3 with fluent speech, no focal motor/sensory deficits   LABORATORY DATA: Results for orders placed in visit on 12/18/12 (from the past 48 hour(s))  CBC WITH DIFFERENTIAL     Status: Abnormal   Collection Time    12/18/12  8:13 AM      Result Value Range   WBC 7.2  3.9 - 10.3 10e3/uL   NEUT# 4.5  1.5 - 6.5 10e3/uL   HGB 9.6 (*) 11.6 - 15.9 g/dL   HCT 56.2 (*) 13.0 - 86.5 %   Platelets 278  145 - 400 10e3/uL   MCV 88.1  79.5 - 101.0 fL   MCH 30.4  25.1 - 34.0 pg   MCHC 34.5  31.5 - 36.0 g/dL   RBC 7.84 (*) 6.96 - 2.95 10e6/uL   RDW 14.5  11.2 - 14.5 %   lymph# 1.7  0.9 - 3.3 10e3/uL   MONO# 0.5  0.1 - 0.9 10e3/uL   Eosinophils Absolute 0.4  0.0 - 0.5 10e3/uL   Basophils Absolute 0.1  0.0 - 0.1 10e3/uL   NEUT% 62.4  38.4 - 76.8 %   LYMPH% 23.4  14.0 - 49.7 %   MONO% 7.3  0.0 - 14.0 %   EOS% 6.1  0.0 - 7.0 %   BASO% 0.8  0.0 - 2.0 %    Labs:  Lab Results  Component Value Date   WBC 7.2 12/18/2012   HGB 9.6* 12/18/2012   HCT 27.9* 12/18/2012   MCV 88.1 12/18/2012   PLT 278 12/18/2012   NEUTROABS 4.5 12/18/2012      Chemistry      Component Value Date/Time   NA 143 12/11/2012 1026   NA 140 12/09/2012 1022   K 4.4 12/11/2012 1026   K 4.0 12/09/2012 1022   CL 103 12/09/2012 1022   CO2 29 12/11/2012 1026   CO2 30 12/09/2012 1022   BUN 24.4 12/11/2012 1026   BUN 24* 12/09/2012 1022   CREATININE 1.7* 12/11/2012 1026   CREATININE 1.6* 12/09/2012 1022      Component Value Date/Time   CALCIUM 10.1 12/11/2012 1026   CALCIUM 9.6 12/09/2012 1022   ALKPHOS 88 12/11/2012 1026   ALKPHOS 75 02/24/2012 1725   AST 16 12/11/2012 1026   AST 24 02/24/2012 1725   ALT 12 12/11/2012 1026    ALT 20 02/24/2012 1725   BILITOT 0.37 12/11/2012 1026   BILITOT 0.6 02/24/2012 1725       RADIOGRAPHIC STUDIES: Dg Chest 2 View  12/10/2012   CLINICAL DATA:  Shortness of breath, fever for 2 weeks, hypertension, history breast cancer, melanoma, former smoker  EXAM: CHEST  2 VIEW  COMPARISON:  None  FINDINGS: Normal heart size, mediastinal contours, and pulmonary vascularity.  Lungs emphysematous but clear.  No pleural effusion or pneumothorax.  Bones demineralized.  Post right mastectomy.  IMPRESSION: No acute abnormalities.   Electronically Signed   By: Ulyses Southward M.D.   On: 12/10/2012  17:35   US Renal  12/10/2012   CLINICAL DATA:  New onset renal failure  EXAM: RENAL/URINARY TRACT ULTRASOUND COMPLETE  COMPARISON:  None.  FINDINGS: Right Kidney  Length: 11.0 cm. Echogenicity within normal limits. No mass or hydronephrosis visualized.  Left Kidney  Length: 11.0 cm. Echogenicity within normal limits. No mass or hydronephrosis visualized.  Bladder:  Within normal limits.  IMPRESSION: Negative renal ultrasound.   Electronically Signed   By: Charline Bills M.D.   On: 12/10/2012 15:17    ASSESSMENT: Laura Li is a 72 y.o. 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 ESR is highly elevated. Time course and history points away from chronic illnesses such as autoimmune (negative ANA) diseases.   PLAN:  1. Anemia NOS.  --Her Hemoglobin was normal 9 months ago at 13.4; One week ago it was 10.8.  Last visit is 9.0 and today its 9.6.  Normal WBCs and platlets counts points away from a primary bone marrow problem. She did have slightly high eosinophils (5.7%) one a week ago which is seen in allergic acute interstitial nephritis (AIN). Her anemia appears to be normocytic and likely secondary to #2.    2. Acute kidney injury likely secondary to Dyazide  (HCTZ/Triamterene). GFR on 02/2012 was 73.61 but on 12/01/2012 was 31.44.  --Renal u/s negative for obstruction and otherwise normal; Etiology likely intrinsic based on her history (AIN vs acute tubular necrosis) with BUN/Creatinine less than 20. She was counseled to avoid further nephrotoxins and continue adequate hydration. ? Steroids if AIN.  Her creatinine is slightly improved to 1.5 down from 1.7 last visit.  At the request of continued elevation, the family requested nephrology referral.   We will repeat labs in one week.   --If persists and exact etiology remains unclear, she will likely need referral to nephrology for consideration of renal biopsy.   3. Meniere's Disease  --Counseled to discontinue Dyazide.   4. Follow-up.  --Patient was counseled to follow up a one month for a repeat CBC/BMP.   We will repeat labs in one week to ensure labs are improving.   All questions were answered. The patient knows to call the clinic with any problems, questions or concerns. We can certainly see the patient much sooner if necessary.  I spent 10 minutes counseling the patient face to face. The total time spent in the appointment was 15 minutes.    Makeba Delcastillo, MD 12/18/2012 8:41 AM

## 2012-12-18 NOTE — Telephone Encounter (Signed)
gv and printed appt sched and avs for pt...lvm and sent referral to [facility] for appt...gv info for pt and to contact me with appt.

## 2012-12-25 ENCOUNTER — Telehealth: Payer: Self-pay | Admitting: Internal Medicine

## 2012-12-25 ENCOUNTER — Other Ambulatory Visit (HOSPITAL_BASED_OUTPATIENT_CLINIC_OR_DEPARTMENT_OTHER): Payer: Medicare Other

## 2012-12-25 ENCOUNTER — Telehealth: Payer: Self-pay

## 2012-12-25 DIAGNOSIS — N289 Disorder of kidney and ureter, unspecified: Secondary | ICD-10-CM | POA: Diagnosis not present

## 2012-12-25 DIAGNOSIS — D649 Anemia, unspecified: Secondary | ICD-10-CM

## 2012-12-25 LAB — BASIC METABOLIC PANEL (CC13)
Anion Gap: 9 mEq/L (ref 3–11)
BUN: 26.2 mg/dL — ABNORMAL HIGH (ref 7.0–26.0)
CO2: 26 mEq/L (ref 22–29)
Calcium: 10.1 mg/dL (ref 8.4–10.4)
Chloride: 105 mEq/L (ref 98–109)
Creatinine: 1.5 mg/dL — ABNORMAL HIGH (ref 0.6–1.1)
Glucose: 94 mg/dl (ref 70–140)
Sodium: 139 mEq/L (ref 136–145)

## 2012-12-25 LAB — CBC WITH DIFFERENTIAL/PLATELET
BASO%: 0.8 % (ref 0.0–2.0)
Basophils Absolute: 0.1 10*3/uL (ref 0.0–0.1)
EOS%: 4 % (ref 0.0–7.0)
Eosinophils Absolute: 0.3 10*3/uL (ref 0.0–0.5)
HCT: 29 % — ABNORMAL LOW (ref 34.8–46.6)
HGB: 10 g/dL — ABNORMAL LOW (ref 11.6–15.9)
LYMPH%: 21.3 % (ref 14.0–49.7)
MCH: 30.4 pg (ref 25.1–34.0)
MONO#: 0.6 10*3/uL (ref 0.1–0.9)
MONO%: 9.4 % (ref 0.0–14.0)
NEUT#: 4.4 10*3/uL (ref 1.5–6.5)
NEUT%: 64.5 % (ref 38.4–76.8)
Platelets: 266 10*3/uL (ref 145–400)
WBC: 6.9 10*3/uL (ref 3.9–10.3)
lymph#: 1.5 10*3/uL (ref 0.9–3.3)

## 2012-12-25 NOTE — Telephone Encounter (Signed)
[facility] called and advised they needed all MD notes and pt info...gv to [employer]

## 2012-12-25 NOTE — Telephone Encounter (Signed)
Pt called stating she still does not have appt with [facility]. A note in chart states [facility] called asking for medical records today. The pt was told [facility] was aware of referral and working on it. Pt is anxious to get an appt so she can start feeling better. [facility] was informed of pt wishes to be seen ASAP.

## 2012-12-30 ENCOUNTER — Other Ambulatory Visit: Payer: Self-pay | Admitting: Medical Oncology

## 2012-12-30 ENCOUNTER — Telehealth: Payer: Self-pay | Admitting: Medical Oncology

## 2012-12-30 ENCOUNTER — Telehealth: Payer: Self-pay

## 2012-12-30 DIAGNOSIS — D649 Anemia, unspecified: Secondary | ICD-10-CM

## 2012-12-30 DIAGNOSIS — N289 Disorder of kidney and ureter, unspecified: Secondary | ICD-10-CM

## 2012-12-30 NOTE — Telephone Encounter (Signed)
I called pt to see if she has heard from [facility] about her appointment. She states no.I informed her that I did speak with someone in their office this am. I also faxed the records x 2. I am aware she came by the office this afternoon and spoke with Wonda Horner in medical records also faxed the records. Per Dr. Rosie Fate we can check her labs 01/01/13 while we are waiting for the appointment. She agrees with this. POF made.

## 2012-12-30 NOTE — Telephone Encounter (Signed)
Faxed pt medical records to [facility] °

## 2012-12-30 NOTE — Telephone Encounter (Signed)
I called pt 12/29/12 and left her a message asking if she has heard from [facility]. She called back this am and states she has not heard a word. I called [facility] and spoke with new pt coordinator and she said they have a new computer system and things are little delayed. She asked that we fax records again. I faxed records my [facility] fax and thru computer. I asked them to call me as soon as they get it scheduled.

## 2012-12-31 ENCOUNTER — Telehealth: Payer: Self-pay | Admitting: Internal Medicine

## 2013-01-01 ENCOUNTER — Other Ambulatory Visit (HOSPITAL_BASED_OUTPATIENT_CLINIC_OR_DEPARTMENT_OTHER): Payer: Medicare Other | Admitting: Lab

## 2013-01-01 DIAGNOSIS — N289 Disorder of kidney and ureter, unspecified: Secondary | ICD-10-CM

## 2013-01-01 DIAGNOSIS — D649 Anemia, unspecified: Secondary | ICD-10-CM

## 2013-01-01 LAB — CBC WITH DIFFERENTIAL/PLATELET
BASO%: 0.8 % (ref 0.0–2.0)
Basophils Absolute: 0 10*3/uL (ref 0.0–0.1)
EOS%: 4.7 % (ref 0.0–7.0)
Eosinophils Absolute: 0.3 10*3/uL (ref 0.0–0.5)
HCT: 29.2 % — ABNORMAL LOW (ref 34.8–46.6)
HGB: 10 g/dL — ABNORMAL LOW (ref 11.6–15.9)
LYMPH%: 21.3 % (ref 14.0–49.7)
MCV: 88.9 fL (ref 79.5–101.0)
MONO#: 0.5 10*3/uL (ref 0.1–0.9)
MONO%: 7.6 % (ref 0.0–14.0)
NEUT#: 3.9 10*3/uL (ref 1.5–6.5)
NEUT%: 65.6 % (ref 38.4–76.8)
Platelets: 221 10*3/uL (ref 145–400)
RBC: 3.28 10*6/uL — ABNORMAL LOW (ref 3.70–5.45)
RDW: 14.8 % — ABNORMAL HIGH (ref 11.2–14.5)
WBC: 6 10*3/uL (ref 3.9–10.3)
lymph#: 1.3 10*3/uL (ref 0.9–3.3)

## 2013-01-01 LAB — COMPREHENSIVE METABOLIC PANEL (CC13)
AST: 18 U/L (ref 5–34)
Albumin: 4.1 g/dL (ref 3.5–5.0)
Alkaline Phosphatase: 86 U/L (ref 40–150)
Anion Gap: 11 mEq/L (ref 3–11)
BUN: 24.7 mg/dL (ref 7.0–26.0)
CO2: 26 mEq/L (ref 22–29)
Creatinine: 1.5 mg/dL — ABNORMAL HIGH (ref 0.6–1.1)
Glucose: 89 mg/dl (ref 70–140)
Potassium: 4.2 mEq/L (ref 3.5–5.1)
Total Bilirubin: 0.47 mg/dL (ref 0.20–1.20)
Total Protein: 8.9 g/dL — ABNORMAL HIGH (ref 6.4–8.3)

## 2013-01-04 ENCOUNTER — Telehealth: Payer: Self-pay | Admitting: Internal Medicine

## 2013-01-04 ENCOUNTER — Other Ambulatory Visit: Payer: Self-pay | Admitting: Internal Medicine

## 2013-01-04 DIAGNOSIS — N289 Disorder of kidney and ureter, unspecified: Secondary | ICD-10-CM

## 2013-01-04 DIAGNOSIS — R700 Elevated erythrocyte sedimentation rate: Secondary | ICD-10-CM

## 2013-01-04 DIAGNOSIS — D649 Anemia, unspecified: Secondary | ICD-10-CM

## 2013-01-04 NOTE — Telephone Encounter (Signed)
Patient calling to request the following:  1. Has had burning, itching sensation in vaginal area for the past 3-4 days, thinks it may be a yeast infection, states she has never had one before and has been using infant desitin.  However, she wants to know if she needs an office visit or if rx can be called in.  2. Pt states she was referred to [facility] by PCP for an acute kidney injury 2 weeks ago.  However, after speaking with [facility] she was informed it may be up to 4 months before she can be seen at their facility.  Pt wants to know if any thing can be done to expedite this appt since this is an urgent situation per the patient.

## 2013-01-06 ENCOUNTER — Telehealth: Payer: Self-pay | Admitting: Internal Medicine

## 2013-01-06 ENCOUNTER — Other Ambulatory Visit: Payer: Self-pay | Admitting: Medical Oncology

## 2013-01-06 NOTE — Telephone Encounter (Signed)
Spoke with patient today.  She feels a bit better but not 100%.  Suggested to patient that we will rule out multiple myeloma or smothering multiple myeloma with a UPEP, SPEP and immunofixation and kappa lambda chains.  PCP office made aware also.

## 2013-01-06 NOTE — Telephone Encounter (Signed)
Use otc monistat Call [facility] see if she can have sooner appointment: renal insufficiency with inflammatory disorder and anemia

## 2013-01-06 NOTE — Telephone Encounter (Signed)
sw pt gv appt for lab on 01/07/13 @ 10:15am per pt request...td

## 2013-01-07 ENCOUNTER — Other Ambulatory Visit: Payer: Medicare Other | Admitting: Lab

## 2013-01-07 DIAGNOSIS — D649 Anemia, unspecified: Secondary | ICD-10-CM | POA: Diagnosis not present

## 2013-01-07 DIAGNOSIS — N289 Disorder of kidney and ureter, unspecified: Secondary | ICD-10-CM | POA: Diagnosis not present

## 2013-01-07 NOTE — Telephone Encounter (Signed)
Pt has not been scheduled through [facility] yet.  Bjorn Loser, [facility] stated that they just went to EMR 2 wks ago and unless pt's kidney function has gotten worse than she can't really get in her "sooner".  Please advise, pt aware to get monistat

## 2013-01-08 NOTE — Telephone Encounter (Signed)
Per Dr Cato Mulligan, keep nephrology referral and still refer to rheumatology.  Pt aware, referral order placed

## 2013-01-08 NOTE — Telephone Encounter (Signed)
I would like to get another opinion about her inflammatory disorder. Have her see rheumatology. Indication anemia, elevated sedimentation rate, new onset renal insufficiency. Okay to cancel appointment with nephrology.

## 2013-01-08 NOTE — Telephone Encounter (Signed)
Pt is aware of the rheumatology referral, however when I went to cancel nephrology referral I noticed that Dr Rosie Fate referred her.  Do you still want to cancel nephrology referral?

## 2013-01-11 DIAGNOSIS — N289 Disorder of kidney and ureter, unspecified: Secondary | ICD-10-CM | POA: Diagnosis not present

## 2013-01-11 LAB — SPEP & IFE WITH QIG
Alpha-2-Globulin: 9.1 % (ref 7.1–11.8)
Beta Globulin: 5.8 % (ref 4.7–7.2)
Gamma Globulin: 20.7 % — ABNORMAL HIGH (ref 11.1–18.8)
IgA: 402 mg/dL — ABNORMAL HIGH (ref 69–380)
IgG (Immunoglobin G), Serum: 1730 mg/dL — ABNORMAL HIGH (ref 690–1700)
IgM, Serum: 148 mg/dL (ref 52–322)
Total Protein, Serum Electrophoresis: 8 g/dL (ref 6.0–8.3)

## 2013-01-11 LAB — KAPPA/LAMBDA LIGHT CHAINS
Kappa free light chain: 4.87 mg/dL — ABNORMAL HIGH (ref 0.33–1.94)
Lambda Free Lght Chn: 3.84 mg/dL — ABNORMAL HIGH (ref 0.57–2.63)

## 2013-01-13 LAB — UIFE/LIGHT CHAINS/TP QN, 24-HR UR
Albumin, U: DETECTED
Free Kappa/Lambda Ratio: 9.2 ratio (ref 2.04–10.37)
Free Lambda Excretion/Day: 6 mg/d
Time: 24 hours
Total Protein, Urine-Ur/day: 80 mg/d (ref 10–140)
Total Protein, Urine: 2 mg/dL

## 2013-01-14 ENCOUNTER — Other Ambulatory Visit: Payer: Self-pay

## 2013-01-14 ENCOUNTER — Encounter: Payer: Self-pay | Admitting: Internal Medicine

## 2013-01-18 ENCOUNTER — Other Ambulatory Visit (HOSPITAL_BASED_OUTPATIENT_CLINIC_OR_DEPARTMENT_OTHER): Payer: Medicare Other | Admitting: Lab

## 2013-01-18 ENCOUNTER — Ambulatory Visit (HOSPITAL_BASED_OUTPATIENT_CLINIC_OR_DEPARTMENT_OTHER): Payer: Medicare Other | Admitting: Internal Medicine

## 2013-01-18 VITALS — BP 125/64 | Ht 66.0 in | Wt 146.5 lb

## 2013-01-18 DIAGNOSIS — N179 Acute kidney failure, unspecified: Secondary | ICD-10-CM | POA: Diagnosis not present

## 2013-01-18 DIAGNOSIS — N289 Disorder of kidney and ureter, unspecified: Secondary | ICD-10-CM

## 2013-01-18 DIAGNOSIS — D649 Anemia, unspecified: Secondary | ICD-10-CM | POA: Diagnosis not present

## 2013-01-18 DIAGNOSIS — R0609 Other forms of dyspnea: Secondary | ICD-10-CM

## 2013-01-18 DIAGNOSIS — H8109 Meniere's disease, unspecified ear: Secondary | ICD-10-CM | POA: Diagnosis not present

## 2013-01-18 LAB — CBC WITH DIFFERENTIAL/PLATELET
Basophils Absolute: 0 10*3/uL (ref 0.0–0.1)
EOS%: 3 % (ref 0.0–7.0)
Eosinophils Absolute: 0.2 10*3/uL (ref 0.0–0.5)
HGB: 10 g/dL — ABNORMAL LOW (ref 11.6–15.9)
LYMPH%: 32.5 % (ref 14.0–49.7)
MONO#: 0.5 10*3/uL (ref 0.1–0.9)
NEUT#: 2.9 10*3/uL (ref 1.5–6.5)
RBC: 3.19 10*6/uL — ABNORMAL LOW (ref 3.70–5.45)
RDW: 14.2 % (ref 11.2–14.5)
WBC: 5.3 10*3/uL (ref 3.9–10.3)
lymph#: 1.7 10*3/uL (ref 0.9–3.3)

## 2013-01-18 LAB — BASIC METABOLIC PANEL (CC13)
Anion Gap: 9 meq/L (ref 3–11)
BUN: 24.2 mg/dL (ref 7.0–26.0)
CO2: 25 meq/L (ref 22–29)
Calcium: 9.7 mg/dL (ref 8.4–10.4)
Chloride: 106 meq/L (ref 98–109)
Creatinine: 1.3 mg/dL — ABNORMAL HIGH (ref 0.6–1.1)
Glucose: 89 mg/dL (ref 70–140)
Potassium: 4.5 meq/L (ref 3.5–5.1)
Sodium: 139 meq/L (ref 136–145)

## 2013-01-19 NOTE — Progress Notes (Signed)
[facility] OFFICE PROGRESS NOTE  Judie Petit, MD [address] [place] [address]  DIAGNOSIS: Anemia  Renal insufficiency  Chief Complaint  Patient presents with  . Anemia    CURRENT THERAPY:  Observation  INTERVAL HISTORY: Laura Li is a 72 y.o. female with a recent history of Meniere's disease managed with dyazide (HCTZ/Triameterene) who is presenting for further evaluation of her anemia. She was last seen by our office on 12/18/2012. Today, she is accompanied by her daughter. She reports starting Dyazide for Meniere's disease about 3 months ago. She states she has "felt off" since taking it with increasing lethargy and dyspnea when going up stairs. She denies yellow eyes or early satiety or bleeding Or picca. She denies melena or hematochezia. She had a colonoscopy and EGD in 2010 and has been of PPI. She stopped dyazide on 12/04/2012. She denies skin rashes or joint arthralgias. She denies bone pain or recurrent infections. She denies using any additional new medications. Of note, she recently developed an acute kidney injury.  Previously, she had an ESR of greater than 120.   Today, she reports with her daughter.  She reports slight improvement in her dyspnea on exertion.   She denies any chest pain.  She does endorse a history of GERD for which she takes protonix.  She does endorse a faint rash which appears on her extremities occasionally.  She described it as very itchy and could not determine it this occurred with her prior dyazide exposure.  She has an appointment to see Rheumatology and nephrology.   MEDICAL HISTORY: Past Medical History  Diagnosis Date  . OA (osteoarthritis)   . History of DVT (deep vein thrombosis)   . Depression   . Hyperlipidemia   . Hypertension   . Hypothyroidism   . HX: breast cancer     melanoma  . Melanoma   . Eczema   . Arthritis   . Esophageal stricture   . GERD (gastroesophageal reflux disease)      INTERIM HISTORY: has HYPOTHYROIDISM; HYPERLIPIDEMIA; DEPRESSION; HYPERTENSION; ALLERGIC  RHINITIS; OSTEOPOROSIS; BREAST CANCER, HX OF; DVT, HX OF; Renal insufficiency; and Anemia on her problem list.    ALLERGIES:  is allergic to niacin and penicillins.  MEDICATIONS: has a current medication list which includes the following prescription(s): acetaminophen, atorvastatin, b complex-c-folic acid, calcium-vitamin d, fexofenadine, ipratropium, levothyroxine, nadolol, nexium, and venlafaxine xr.  SURGICAL HISTORY:  Past Surgical History  Procedure Laterality Date  . Appendectomy  1967  . Mastectomy  1998    right  . Melanoma excision  2004    right side of face  . Mohs surgery  2005    right left  . Total abdominal hysterectomy  1992  . Tonsillectomy and adenoidectomy  1949  . Breast lumpectomy      left breast  . Breast surgery  1977    removal of calcified milk gland right breast  . Lipoma excision  1978    right breast    REVIEW OF SYSTEMS:   Constitutional: Denies fevers, chills or abnormal weight loss Eyes: Denies blurriness of vision Ears, nose, mouth, throat, and face: Denies mucositis or sore throat Respiratory: Denies cough, dyspnea or wheezes Cardiovascular: Denies palpitation, chest discomfort or lower extremity swelling Gastrointestinal:  Denies nausea, heartburn or change in bowel habits Skin: Denies abnormal skin rashes Lymphatics: Denies new lymphadenopathy or easy bruising Neurological:Denies numbness, tingling or new weaknesses Behavioral/Psych: Mood is stable, no new changes  All other systems  were reviewed with the patient and are negative.  PHYSICAL EXAMINATION: ECOG PERFORMANCE STATUS: 1 - Symptomatic but completely ambulatory  Blood pressure 125/64, height 5\' 6"  (1.676 m), weight 146 lb 8 oz (66.452 kg).  GENERAL:alert, no distress and comfortable SKIN: skin color, texture, turgor are normal, no rashes (did not observe a rash) or significant  lesions EYES: normal, Conjunctiva are pink and non-injected, sclera clear OROPHARYNX:no exudate, no erythema and lips, buccal mucosa, and tongue normal  NECK: supple, thyroid normal size, non-tender, without nodularity LYMPH:  no palpable lymphadenopathy in the cervical, axillary or supraclavicular LUNGS: clear to auscultation and percussion with normal breathing effort HEART: regular rate & rhythm and no murmurs and no lower extremity edema ABDOMEN:abdomen soft, non-tender and normal bowel sounds Musculoskeletal:no cyanosis of digits and no clubbing  NEURO: alert & oriented x 3 with fluent speech, no focal motor/sensory deficits  LABORATORY DATA: Results for orders placed in visit on 01/18/13 (from the past 48 hour(s))  CBC WITH DIFFERENTIAL     Status: Abnormal   Collection Time    01/18/13  3:13 PM      Result Value Range   WBC 5.3  3.9 - 10.3 10e3/uL   NEUT# 2.9  1.5 - 6.5 10e3/uL   HGB 10.0 (*) 11.6 - 15.9 g/dL   HCT 16.1 (*) 09.6 - 04.5 %   Platelets 221  145 - 400 10e3/uL   MCV 89.6  79.5 - 101.0 fL   MCH 31.3  25.1 - 34.0 pg   MCHC 35.0  31.5 - 36.0 g/dL   RBC 4.09 (*) 8.11 - 9.14 10e6/uL   RDW 14.2  11.2 - 14.5 %   lymph# 1.7  0.9 - 3.3 10e3/uL   MONO# 0.5  0.1 - 0.9 10e3/uL   Eosinophils Absolute 0.2  0.0 - 0.5 10e3/uL   Basophils Absolute 0.0  0.0 - 0.1 10e3/uL   NEUT% 54.8  38.4 - 76.8 %   LYMPH% 32.5  14.0 - 49.7 %   MONO% 8.8  0.0 - 14.0 %   EOS% 3.0  0.0 - 7.0 %   BASO% 0.9  0.0 - 2.0 %  BASIC METABOLIC PANEL (CC13)     Status: Abnormal   Collection Time    01/18/13  3:13 PM      Result Value Range   Sodium 139  136 - 145 mEq/L   Potassium 4.5  3.5 - 5.1 mEq/L   Chloride 106  98 - 109 mEq/L   CO2 25  22 - 29 mEq/L   Glucose 89  70 - 140 mg/dl   BUN 78.2  7.0 - 95.6 mg/dL   Creatinine 1.3 (*) 0.6 - 1.1 mg/dL   Calcium 9.7  8.4 - 21.3 mg/dL   Anion Gap 9  3 - 11 mEq/L    Labs:  Lab Results  Component Value Date   WBC 5.3 01/18/2013   HGB 10.0*  01/18/2013   HCT 28.6* 01/18/2013   MCV 89.6 01/18/2013   PLT 221 01/18/2013   NEUTROABS 2.9 01/18/2013      Chemistry      Component Value Date/Time   NA 139 01/18/2013 1513   NA 140 12/09/2012 1022   K 4.5 01/18/2013 1513   K 4.0 12/09/2012 1022   CL 103 12/09/2012 1022   CO2 25 01/18/2013 1513   CO2 30 12/09/2012 1022   BUN 24.2 01/18/2013 1513   BUN 24* 12/09/2012 1022   CREATININE 1.3* 01/18/2013 1513   CREATININE  1.6* 12/09/2012 1022      Component Value Date/Time   CALCIUM 9.7 01/18/2013 1513   CALCIUM 9.6 12/09/2012 1022   ALKPHOS 86 01/01/2013 0954   ALKPHOS 75 02/24/2012 1725   AST 18 01/01/2013 0954   AST 24 02/24/2012 1725   ALT 11 01/01/2013 0954   ALT 20 02/24/2012 1725   BILITOT 0.47 01/01/2013 0954   BILITOT 0.6 02/24/2012 1725     Results for CARSON, MECHE (MRN [id number]) as of 01/19/2013 08:56  Ref. Range 01/07/2013 10:18  Albumin ELP Latest Range: 55.8-66.1 % 53.3 (L)  COMMENT (PROTEIN ELECTROPHOR) No range found *  Alpha-1-Globulin Latest Range: 2.9-4.9 % 6.1 (H)  Alpha-2-Globulin Latest Range: 7.1-11.8 % 9.1  Beta Globulin Latest Range: 4.7-7.2 % 5.8  Beta 2 Latest Range: 3.2-6.5 % 5.0  Gamma Globulin Latest Range: 11.1-18.8 % 20.7 (H)  M-SPIKE, % No range found NOT DET  SPE Interp. No range found *  IgG (Immunoglobin G), Serum Latest Range: 340-108-3917 mg/dL 4098 (H)  IgA Latest Range: 69-380 mg/dL 119 (H)  IgM, Serum Latest Range: 52-322 mg/dL 147  Total Protein, Serum Electrophoresis Latest Range: 6.0-8.3 g/dL 8.0  Kappa free light chain Latest Range: 0.33-1.94 mg/dL 8.29 (H)  Lambda Free Lght Chn Latest Range: 0.57-2.63 mg/dL 5.62 (H)  Kappa:Lambda Ratio Latest Range: 0.26-1.65  1.27   Results for BRENETTA, PENNY (MRN [id number]) as of 01/19/2013 08:56  Ref. Range 01/11/2013 10:19  Time-UPE24 No range found 24  Volume, Urine-UPE24 No range found 4000  Total Protein, Urine-UPE24 No range found 2.0  Total Protein, Urine-Ur/day Latest Range:  10-140 mg/day 80  ALBUMIN, U Latest Range: DETECTED  DETECTED  Alpha 1, Urine Latest Range: NONE DET  NONE DET  Alpha 2, Urine Latest Range: NONE DET  NONE DET  Beta, Urine Latest Range: NONE DET  NONE DET  Gamma Globulin, Urine Latest Range: NONE DET  NONE DET  Free Kappa Lt Chains,Ur Latest Range: 0.14-2.42 mg/dL 6.96  Free Lt Chn Excr Rate No range found 55.20  Free Lambda Lt Chains,Ur Latest Range: 0.02-0.67 mg/dL 2.95  Free Lambda Excretion/Day No range found 6.00  Free Kappa/Lambda Ratio Latest Range: 2.04-10.37 ratio 9.20    RADIOGRAPHIC STUDIES: Dg Chest 2 View  12/10/2012   CLINICAL DATA:  Shortness of breath, fever for 2 weeks, hypertension, history breast cancer, melanoma, former smoker  EXAM: CHEST  2 VIEW  COMPARISON:  None  FINDINGS: Normal heart size, mediastinal contours, and pulmonary vascularity.  Lungs emphysematous but clear.  No pleural effusion or pneumothorax.  Bones demineralized.  Post right mastectomy.  IMPRESSION: No acute abnormalities.   Electronically Signed   By: Ulyses Southward M.D.   On: 12/10/2012 17:35   US Renal  12/10/2012   CLINICAL DATA:  New onset renal failure  EXAM: RENAL/URINARY TRACT ULTRASOUND COMPLETE  COMPARISON:  None.  FINDINGS: Right Kidney  Length: 11.0 cm. Echogenicity within normal limits. No mass or hydronephrosis visualized.  Left Kidney  Length: 11.0 cm. Echogenicity within normal limits. No mass or hydronephrosis visualized.  Bladder:  Within normal limits.  IMPRESSION: Negative renal ultrasound.   Electronically Signed   By: Charline Bills M.D.   On: 12/10/2012 15:17    ASSESSMENT: Laura Li is a 72 y.o. 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 ESR is highly elevated (greater than 120). Time course  and history points away from chronic illnesses such as autoimmune (negative ANA) diseases.    PLAN:  1. Anemia NOS.  --Her Hemoglobin was normal 10 months ago at 13.4; Five  weeks ago it was 10.8.  On her initial visit, it was 9.0 and today its 10.  Normal WBCs and platlets counts points away from a primary bone marrow problem. She did have slightly high eosinophils (5.7%) five weeks ago which is seen in allergic acute interstitial nephritis (AIN). Her anemia appears to be normocytic and likely secondary to #2.    2.  Dyazide (HCTZ/Triamterene) reaction versus an autoimmune d/o. GFR on 02/2012 was 73.61 but on 12/01/2012 was 31.44.  --Renal u/s negative for obstruction and otherwise normal; Etiology likely intrinsic based on her history (AIN vs acute tubular necrosis) with BUN/Creatinine less than 20. She was counseled to avoid further nephrotoxins and continue adequate hydration.  Her creatinine is slightly improved to 1.3 down from 1.5  5 weeks ago down from 1.7 the initial visit.  At the request of continued elevation, the family requested nephrology referral.     3. Hypercalcemia, resolved. --Based on this and the above anemia and renal insufficiency, we completed a Multiple myeloma workup (as noted above). May repeat in 6-8 months the SPEP, but kappa lambda light chains negative.  UPEP, negative.   4. Meniere's Disease  --Counseled to discontinue Dyazide.   4. Follow-up.  --Patient was counseled to follow up with nephrology and rheumatology for an additional work-up.  She is scheduled to Dr. Lurena Nida, [facility] At [id number], [id number].  She will follow-up with our offices prn.   All questions were answered. The patient knows to call the clinic with any problems, questions or concerns. We can certainly see the patient much sooner if necessary.  I spent 10 minutes counseling the patient face to face. The total time spent in the appointment was 15 minutes.    Bernita Beckstrom, MD 01/19/2013 8:47 AM

## 2013-01-21 DIAGNOSIS — H2510 Age-related nuclear cataract, unspecified eye: Secondary | ICD-10-CM | POA: Diagnosis not present

## 2013-01-28 DIAGNOSIS — R6889 Other general symptoms and signs: Secondary | ICD-10-CM | POA: Diagnosis not present

## 2013-02-16 DIAGNOSIS — R6889 Other general symptoms and signs: Secondary | ICD-10-CM | POA: Diagnosis not present

## 2013-02-23 ENCOUNTER — Other Ambulatory Visit: Payer: Self-pay

## 2013-02-23 DIAGNOSIS — Z1231 Encounter for screening mammogram for malignant neoplasm of breast: Secondary | ICD-10-CM

## 2013-02-25 ENCOUNTER — Ambulatory Visit
Admission: RE | Admit: 2013-02-25 | Discharge: 2013-02-25 | Disposition: A | Discharge status: Home / Self Care | Payer: Medicare Other | Source: Ambulatory Visit

## 2013-02-25 ENCOUNTER — Telehealth: Payer: Self-pay | Admitting: Internal Medicine

## 2013-02-25 DIAGNOSIS — Z1231 Encounter for screening mammogram for malignant neoplasm of breast: Secondary | ICD-10-CM | POA: Diagnosis not present

## 2013-02-25 DIAGNOSIS — Z853 Personal history of malignant neoplasm of breast: Secondary | ICD-10-CM | POA: Diagnosis not present

## 2013-02-25 NOTE — Telephone Encounter (Signed)
Patient Information:  Caller Name: Arti  Phone: [phone]  Patient: Laura Li, Laura Li  Gender: Female  DOB: 04/30/1940  Age: 72 Years  PCP: Birdie Sons (Adults only)  Office Follow Up:  Does the office need to follow up with this patient?: Yes  Instructions For The Office: Patient would like advice for OTC products for a cold.  recent issues with kidney damage from Diuretic medication.  Please contact. Supportive care provided.  RN Note:  Patient would like guidance OTC product to use for cold.  Recent issues with a medication Allergy (Diuretic kidney damage, anemic) seeking guidance.  Please contct patient regarding cold treatment.  Symptoms  Reason For Call & Symptoms: Patient states onset of scratchy throat and head congestion, start of "cold". runny nose clear,  slight cough dry. Afebrile.  Onset yesterday 02/24/13.   She reports reaction of medication since September 2014 (Hematology and rheumatology) She is not sure what she can take for this cold.  Reviewed Health History In EMR: Yes  Reviewed Medications In EMR: Yes  Reviewed Allergies In EMR: Yes  Reviewed Surgeries / Procedures: Yes  Date of Onset of Symptoms: 02/24/2013  Treatments Tried: Tylenol  Treatments Tried Worked: No  Guideline(s) Used:  Colds  Disposition Per Guideline:   Home Care  Reason For Disposition Reached:   Colds with no complications  Advice Given:  Reassurance  It sounds like an uncomplicated cold that we can treat at home.  Colds are very common and may make you feel uncomfortable.  Colds are caused by viruses, and no medicine or "shot" will cure an uncomplicated cold.  Colds are usually not serious.  Here is some care advice that should help.  For a Runny Nose With Profuse Discharge:   Nasal mucus and discharge helps to wash viruses and bacteria out of the nose and sinuses.  Blowing the nose is all that is needed.  If the skin around your nostrils gets irritated, apply a tiny amount of  petroleum ointment to the nasal openings once or twice a day.  For a Stuffy Nose - Use Nasal Washes:  Introduction: Saline (salt water) nasal irrigation (nasal wash) is an effective and simple home remedy for treating stuffy nose and sinus congestion. The nose can be irrigated by pouring, spraying, or squirting salt water into the nose and then letting it run back out.  How it Helps: The salt water rinses out excess mucus, washes out any irritants (dust, allergens) that might be present, and moistens the nasal cavity.  Methods: There are several ways to perform nasal irrigation. You can use a saline nasal spray bottle (available over-the-counter), a rubber ear syringe, a medical syringe without the needle, or a Neti Pot.  For a Stuffy Nose - Use Nasal Washes:  Introduction: Saline (salt water) nasal irrigation (nasal wash) is an effective and simple home remedy for treating stuffy nose and sinus congestion. The nose can be irrigated by pouring, spraying, or squirting salt water into the nose and then letting it run back out.  How it Helps: The salt water rinses out excess mucus, washes out any irritants (dust, allergens) that might be present, and moistens the nasal cavity.  Methods: There are several ways to perform nasal irrigation. You can use a saline nasal spray bottle (available over-the-counter), a rubber ear syringe, a medical syringe without the needle, or a Neti Pot.  Step-By-Step Instructions:   Step 1: Lean over a sink.  Step 2: Gently squirt or spray warm salt  water into one of your nostrils.  Step 3: Some of the water may run into the back of your throat. Spit this out. If you swallow the salt water it will not hurt you.  Step 4: Blow your nose to clean out the water and mucus.  Step 5: Repeat steps 1-4 for the other nostril. You can do this a couple times a day if it seems to help you.  How to Make Saline [facility] Water) Nasal Wash :  You can make your own saline nasal wash.  Add 1/2 tsp  of table salt to 1 cup (8 oz; 240 ml) of warm water.  Treatment for Associated Symptoms of Colds:  For muscle aches, headaches, or moderate fever (more than 101 F or 38.9 C): Take acetaminophen every 4 hours.  Sore throat: Try throat lozenges, hard candy, or warm chicken broth.  Cough: Use cough drops.  Hydrate: Drink adequate liquids.  Humidifier:  If the air in your home is dry, use a cool-mist humidifier  Call Back If:  Difficulty breathing occurs  Fever lasts more than 3 days  Nasal discharge lasts more than 10 days  Cough lasts more than 3 weeks  You become worse  RN Overrode Recommendation:  Follow Up With Office Later  Patient would like advice for OTC products for a cold.  recent issues with kidney damage from Diuretic medication.  Please contact. Supportive care provided.

## 2013-02-28 NOTE — Telephone Encounter (Signed)
Chlorpheniramine should be ok

## 2013-03-01 NOTE — Telephone Encounter (Signed)
Pt aware.

## 2013-03-12 ENCOUNTER — Encounter: Payer: Medicare Other | Admitting: Internal Medicine

## 2013-03-15 ENCOUNTER — Telehealth: Payer: Self-pay | Admitting: Internal Medicine

## 2013-03-15 MED ORDER — ATORVASTATIN CALCIUM 10 MG PO TABS: 10.0000 mg | ORAL_TABLET | ORAL | Status: DC

## 2013-03-15 NOTE — Telephone Encounter (Signed)
Pt request refill of atorvastatin (LIPITOR) 10 MG tablet . Pt states [facility] states they could not get in toucch w/ [place]!! Pt has cpe 03/19/13

## 2013-03-15 NOTE — Telephone Encounter (Signed)
rx sent in electronically 

## 2013-03-19 ENCOUNTER — Encounter: Payer: Self-pay | Admitting: Internal Medicine

## 2013-03-19 ENCOUNTER — Ambulatory Visit (INDEPENDENT_AMBULATORY_CARE_PROVIDER_SITE_OTHER): Payer: Medicare Other | Admitting: Internal Medicine

## 2013-03-19 VITALS — BP 134/78 | HR 76 | Temp 97.7°F | Ht 66.0 in | Wt 146.0 lb

## 2013-03-19 DIAGNOSIS — I776 Arteritis, unspecified: Secondary | ICD-10-CM | POA: Diagnosis not present

## 2013-03-19 DIAGNOSIS — I10 Essential (primary) hypertension: Secondary | ICD-10-CM

## 2013-03-19 DIAGNOSIS — N289 Disorder of kidney and ureter, unspecified: Secondary | ICD-10-CM | POA: Diagnosis not present

## 2013-03-19 DIAGNOSIS — E785 Hyperlipidemia, unspecified: Secondary | ICD-10-CM | POA: Diagnosis not present

## 2013-03-19 DIAGNOSIS — D649 Anemia, unspecified: Secondary | ICD-10-CM | POA: Diagnosis not present

## 2013-03-19 DIAGNOSIS — E039 Hypothyroidism, unspecified: Secondary | ICD-10-CM | POA: Diagnosis not present

## 2013-03-19 LAB — CBC WITH DIFFERENTIAL/PLATELET
Basophils Absolute: 0 10*3/uL (ref 0.0–0.1)
Basophils Relative: 0.5 % (ref 0.0–3.0)
EOS ABS: 0.1 10*3/uL (ref 0.0–0.7)
Eosinophils Relative: 3 % (ref 0.0–5.0)
HCT: 32.5 % — ABNORMAL LOW (ref 36.0–46.0)
Hemoglobin: 11.4 g/dL — ABNORMAL LOW (ref 12.0–15.0)
LYMPHS PCT: 26.7 % (ref 12.0–46.0)
Lymphs Abs: 1.3 10*3/uL (ref 0.7–4.0)
MCHC: 35.3 g/dL (ref 30.0–36.0)
MCV: 87.4 fl (ref 78.0–100.0)
Monocytes Absolute: 0.4 10*3/uL (ref 0.1–1.0)
Monocytes Relative: 7.7 % (ref 3.0–12.0)
NEUTROS PCT: 62.1 % (ref 43.0–77.0)
Neutro Abs: 2.9 10*3/uL (ref 1.4–7.7)
Platelets: 228 10*3/uL (ref 150.0–400.0)
RBC: 3.71 Mil/uL — AB (ref 3.87–5.11)
RDW: 13.3 % (ref 11.5–14.6)
WBC: 4.7 10*3/uL (ref 4.5–10.5)

## 2013-03-19 LAB — BASIC METABOLIC PANEL
BUN: 25 mg/dL — AB (ref 6–23)
CALCIUM: 10 mg/dL (ref 8.4–10.5)
CO2: 28 mEq/L (ref 19–32)
Chloride: 105 mEq/L (ref 96–112)
Creatinine, Ser: 1.4 mg/dL — ABNORMAL HIGH (ref 0.4–1.2)
GFR: 40.7 mL/min — ABNORMAL LOW (ref >60.00)
GLUCOSE: 87 mg/dL (ref 70–99)
Potassium: 4.6 mEq/L (ref 3.5–5.1)
Sodium: 140 mEq/L (ref 135–145)

## 2013-03-19 LAB — LIPID PANEL
Cholesterol: 257 mg/dL — ABNORMAL HIGH (ref 0–200)
HDL: 33.3 mg/dL — AB (ref >39.00)
TRIGLYCERIDES: 252 mg/dL — AB (ref 0.0–149.0)
Total CHOL/HDL Ratio: 8
VLDL: 50.4 mg/dL — ABNORMAL HIGH (ref 0.0–40.0)

## 2013-03-19 LAB — TSH: TSH: 2.72 u[IU]/mL (ref 0.35–5.50)

## 2013-03-19 LAB — HEPATIC FUNCTION PANEL
ALBUMIN: 4.4 g/dL (ref 3.5–5.2)
ALT: 19 U/L (ref 0–35)
AST: 22 U/L (ref 0–37)
Alkaline Phosphatase: 64 U/L (ref 39–117)
Bilirubin, Direct: 0.1 mg/dL (ref 0.0–0.3)
TOTAL PROTEIN: 7.9 g/dL (ref 6.0–8.3)
Total Bilirubin: 0.7 mg/dL (ref 0.3–1.2)

## 2013-03-19 LAB — LDL CHOLESTEROL, DIRECT: Direct LDL: 157.6 mg/dL

## 2013-03-19 MED ORDER — VENLAFAXINE HCL ER 37.5 MG PO CP24: 75.0000 mg | ORAL_CAPSULE | Freq: Every day | ORAL | Status: DC

## 2013-03-19 NOTE — Progress Notes (Signed)
htn- tolerating meds  Hypothyroidism- needs f/u  Renal insufficiency- ? 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 OA (osteoarthritis)   . History of DVT (deep vein thrombosis)   . Depression   . Hyperlipidemia   . Hypertension   . Hypothyroidism   . HX: breast cancer     melanoma  . Melanoma   . Eczema   . Arthritis   . Esophageal stricture   . GERD (gastroesophageal reflux disease)     History   Social History  . Marital Status: Widowed    Spouse Name: N/A    Number of Children: N/A  . Years of Education: N/A   Occupational History  . retired    Social History Main Topics  . Smoking status: Former Smoker    Quit date: 03/11/1969  . Smokeless tobacco: Never Used  . Alcohol Use: Yes     Comment: occasional wine  . Drug Use: No  . Sexual Activity: Not on file   Other Topics Concern  . Not on file   Social History Narrative  . No narrative on file    Past Surgical History  Procedure Laterality Date  . Appendectomy  1967  . Mastectomy  1998    right  . Melanoma excision  2004    right side of face  . Mohs surgery  2005    right left  . Total abdominal hysterectomy  1992  . Tonsillectomy and adenoidectomy  1949  . Breast lumpectomy      left breast  . Breast surgery  1977    removal of calcified milk gland right breast  . Lipoma excision  1978    right breast    Family History  Problem Relation Age of Onset  . Heart disease Father   . Endometrial cancer Mother   . Breast cancer Cousin     x 3  . Lung cancer Paternal Aunt   . Brain cancer Maternal Aunt   . Colon cancer Neg Hx   . Diabetes Maternal Aunt     ???    Allergies  Allergen Reactions  . Niacin   . Penicillins     REACTION: GI upset    Current Outpatient Prescriptions on File Prior to Visit  Medication Sig Dispense Refill  . acetaminophen (TYLENOL) 500 MG  tablet Take 500 mg by mouth every 6 (six) hours as needed.        [name] atorvastatin (LIPITOR) 10 MG tablet Take 1 tablet (10 mg total) by mouth every other day. rx'ed as once daily  90 tablet  0  . B Complex-C-Folic Acid (STRESS FORMULA PO) Take 1 tablet by mouth daily.        . Calcium-Vitamin D (CALTRATE 600 PLUS-VIT D PO) Take 2 tablets by mouth daily.       . fexofenadine (ALLEGRA) 180 MG tablet Take 180 mg by mouth daily as needed.       [name] ipratropium (ATROVENT) 0.03 % nasal spray Place 2 sprays into the nose every 12 (twelve) hours.  180 mL  1  . levothyroxine (SYNTHROID, LEVOTHROID) 75 MCG tablet Take 1 tablet (75 mcg total) by mouth daily.  90 tablet  0  . nadolol (CORGARD) 20 MG tablet Take 0.5 tablets (10 mg total) by mouth daily.  90 tablet  0  . NEXIUM 40 MG capsule TAKE 1 CAPSULE DAILY  90 capsule  1  . venlafaxine XR (EFFEXOR XR) 75 MG 24 hr capsule Take 1 capsule (75 mg total) by mouth daily.  90 capsule  1   No current facility-administered medications on file prior to visit.     patient denies chest pain, shortness of breath, orthopnea. Denies lower extremity edema, abdominal pain, change in appetite, change in bowel movements. Patient denies rashes, musculoskeletal complaints. No other specific complaints in a complete review of systems.   BP 134/78  Pulse 76  Temp(Src) 97.7 F (36.5 C) (Oral)  Ht 5\' 6"  (1.676 m)  Wt 146 lb (66.225 kg)  BMI 23.58 kg/m2  Well-developed well-nourished female in no acute distress. HEENT exam atraumatic, normocephalic, extraocular muscles are intact. Neck is supple. No jugular venous distention no thyromegaly. Chest clear to auscultation without increased work of breathing. Cardiac exam S1 and S2 are regular. Abdominal exam active bowel sounds, soft, nontender. Extremities no edema. Neurologic exam she is alert without any motor sensory deficits. Gait is normal.

## 2013-03-19 NOTE — Progress Notes (Signed)
Pre visit review using our clinic review tool, if applicable. No additional management support is needed unless otherwise documented below in the visit note. 

## 2013-03-19 NOTE — Assessment & Plan Note (Signed)
Will check labs-=  i'm expecting improviment (suspect underlying cause= vasculitis)

## 2013-03-25 ENCOUNTER — Telehealth: Payer: Self-pay | Admitting: Internal Medicine

## 2013-03-25 DIAGNOSIS — N760 Acute vaginitis: Secondary | ICD-10-CM | POA: Diagnosis not present

## 2013-03-25 MED ORDER — LEVOTHYROXINE SODIUM 75 MCG PO TABS: 75.0000 ug | ORAL_TABLET | Freq: Every day | ORAL | Status: DC

## 2013-03-25 MED ORDER — NADOLOL 20 MG PO TABS: 10.0000 mg | ORAL_TABLET | Freq: Every day | ORAL | Status: DC

## 2013-03-25 NOTE — Telephone Encounter (Signed)
[facility] requesting new script for levothyroxine (SYNTHROID, LEVOTHROID) 75 MCG tablet

## 2013-03-25 NOTE — Telephone Encounter (Signed)
rx sent in electronically 

## 2013-03-25 NOTE — Telephone Encounter (Signed)
[facility] requesting new script for:  levothyroxine (SYNTHROID, LEVOTHROID) 75 MCG tablet nadolol (CORGARD) 20 MG tablet

## 2013-03-26 ENCOUNTER — Other Ambulatory Visit: Payer: Self-pay | Admitting: *Deleted

## 2013-03-26 ENCOUNTER — Encounter: Payer: Self-pay | Admitting: Internal Medicine

## 2013-03-26 MED ORDER — ATORVASTATIN CALCIUM 20 MG PO TABS: 20.0000 mg | ORAL_TABLET | Freq: Every day | ORAL | Status: DC

## 2013-03-29 ENCOUNTER — Encounter: Payer: Self-pay | Admitting: Internal Medicine

## 2013-03-29 MED ORDER — NADOLOL 20 MG PO TABS: 10.0000 mg | ORAL_TABLET | Freq: Every day | ORAL | Status: DC

## 2013-03-31 ENCOUNTER — Encounter: Payer: Self-pay | Admitting: Internal Medicine

## 2013-04-01 MED ORDER — LEVOTHYROXINE SODIUM 75 MCG PO TABS: 75.0000 ug | ORAL_TABLET | Freq: Every day | ORAL | Status: DC

## 2013-04-01 MED ORDER — NADOLOL 20 MG PO TABS: 10.0000 mg | ORAL_TABLET | Freq: Every day | ORAL | Status: DC

## 2013-04-07 ENCOUNTER — Telehealth: Payer: Self-pay | Admitting: Internal Medicine

## 2013-04-07 NOTE — Telephone Encounter (Signed)
[facility] called and lvm that pt contacted them and told them that she no longer wanted the appt.

## 2013-05-02 ENCOUNTER — Encounter: Payer: Self-pay | Admitting: Internal Medicine

## 2013-05-03 DIAGNOSIS — N390 Urinary tract infection, site not specified: Secondary | ICD-10-CM | POA: Diagnosis not present

## 2013-05-04 MED ORDER — ESOMEPRAZOLE MAGNESIUM 40 MG PO CPDR: DELAYED_RELEASE_CAPSULE | ORAL | Status: DC

## 2013-05-05 ENCOUNTER — Telehealth: Payer: Self-pay | Admitting: Internal Medicine

## 2013-05-05 ENCOUNTER — Encounter: Payer: Self-pay | Admitting: Family

## 2013-05-05 ENCOUNTER — Ambulatory Visit (INDEPENDENT_AMBULATORY_CARE_PROVIDER_SITE_OTHER): Payer: Medicare Other | Admitting: Family

## 2013-05-05 VITALS — BP 120/70 | HR 84 | Temp 98.3°F | Ht 66.0 in | Wt 147.0 lb

## 2013-05-05 DIAGNOSIS — R509 Fever, unspecified: Secondary | ICD-10-CM

## 2013-05-05 DIAGNOSIS — IMO0001 Reserved for inherently not codable concepts without codable children: Secondary | ICD-10-CM

## 2013-05-05 DIAGNOSIS — N390 Urinary tract infection, site not specified: Secondary | ICD-10-CM | POA: Diagnosis not present

## 2013-05-05 LAB — COMPREHENSIVE METABOLIC PANEL
ALT: 14 U/L (ref 0–35)
AST: 23 U/L (ref 0–37)
Albumin: 4.2 g/dL (ref 3.5–5.2)
Alkaline Phosphatase: 66 U/L (ref 39–117)
BUN: 22 mg/dL (ref 6–23)
CO2: 27 mEq/L (ref 19–32)
CREATININE: 1.1 mg/dL (ref 0.4–1.2)
Calcium: 9.9 mg/dL (ref 8.4–10.5)
Chloride: 100 mEq/L (ref 96–112)
GFR: 53.21 mL/min — ABNORMAL LOW (ref >60.00)
Glucose, Bld: 114 mg/dL — ABNORMAL HIGH (ref 70–99)
Potassium: 3.6 mEq/L (ref 3.5–5.1)
Sodium: 135 mEq/L (ref 135–145)
TOTAL PROTEIN: 8 g/dL (ref 6.0–8.3)
Total Bilirubin: 0.8 mg/dL (ref 0.3–1.2)

## 2013-05-05 LAB — CBC WITH DIFFERENTIAL/PLATELET
Basophils Absolute: 0 10*3/uL (ref 0.0–0.1)
Basophils Relative: 0.1 % (ref 0.0–3.0)
EOS ABS: 0.3 10*3/uL (ref 0.0–0.7)
EOS PCT: 2.9 % (ref 0.0–5.0)
HEMATOCRIT: 34.8 % — AB (ref 36.0–46.0)
Hemoglobin: 11.8 g/dL — ABNORMAL LOW (ref 12.0–15.0)
LYMPHS ABS: 0.6 10*3/uL — AB (ref 0.7–4.0)
Lymphocytes Relative: 6.8 % — ABNORMAL LOW (ref 12.0–46.0)
MCHC: 34 g/dL (ref 30.0–36.0)
MCV: 90.1 fl (ref 78.0–100.0)
MONO ABS: 0.6 10*3/uL (ref 0.1–1.0)
Monocytes Relative: 6.9 % (ref 3.0–12.0)
Neutro Abs: 7.5 10*3/uL (ref 1.4–7.7)
PLATELETS: 220 10*3/uL (ref 150.0–400.0)
RBC: 3.86 Mil/uL — ABNORMAL LOW (ref 3.87–5.11)
RDW: 13 % (ref 11.5–14.6)
WBC: 9 10*3/uL (ref 4.5–10.5)

## 2013-05-05 LAB — POCT INFLUENZA A/B
Influenza A, POC: NEGATIVE
Influenza B, POC: NEGATIVE

## 2013-05-05 MED ORDER — CIPROFLOXACIN HCL 500 MG PO TABS: 500.0000 mg | ORAL_TABLET | Freq: Two times a day (BID) | ORAL | Status: AC

## 2013-05-05 NOTE — Patient Instructions (Addendum)
Urinary Tract Infection  Urinary tract infections (UTIs) can develop anywhere along your urinary tract. Your urinary tract is your body's drainage system for removing wastes and extra water. Your urinary tract includes two kidneys, two ureters, a bladder, and a urethra. Your kidneys are a pair of bean-shaped organs. Each kidney is about the size of your fist. They are located below your ribs, one on each side of your spine.  CAUSES  Infections are caused by microbes, which are microscopic organisms, including fungi, viruses, and bacteria. These organisms are so small that they can only be seen through a microscope. Bacteria are the microbes that most commonly cause UTIs.  SYMPTOMS   Symptoms of UTIs may vary by age and gender of the patient and by the location of the infection. Symptoms in young women typically include a frequent and intense urge to urinate and a painful, burning feeling in the bladder or urethra during urination. Older women and men are more likely to be tired, shaky, and weak and have muscle aches and abdominal pain. A fever may mean the infection is in your kidneys. Other symptoms of a kidney infection include pain in your back or sides below the ribs, nausea, and vomiting.  DIAGNOSIS  To diagnose a UTI, your caregiver will ask you about your symptoms. Your caregiver also will ask to provide a urine sample. The urine sample will be tested for bacteria and white blood cells. White blood cells are made by your body to help fight infection.  TREATMENT   Typically, UTIs can be treated with medication. Because most UTIs are caused by a bacterial infection, they usually can be treated with the use of antibiotics. The choice of antibiotic and length of treatment depend on your symptoms and the type of bacteria causing your infection.  HOME CARE INSTRUCTIONS   If you were prescribed antibiotics, take them exactly as your caregiver instructs you. Finish the medication even if you feel better after you  have only taken some of the medication.   Drink enough water and fluids to keep your urine clear or pale yellow.   Avoid caffeine, tea, and carbonated beverages. They tend to irritate your bladder.   Empty your bladder often. Avoid holding urine for long periods of time.   Empty your bladder before and after sexual intercourse.   After a bowel movement, women should cleanse from front to back. Use each tissue only once.  SEEK MEDICAL CARE IF:    You have back pain.   You develop a fever.   Your symptoms do not begin to resolve within 3 days.  SEEK IMMEDIATE MEDICAL CARE IF:    You have severe back pain or lower abdominal pain.   You develop chills.   You have nausea or vomiting.   You have continued burning or discomfort with urination.  MAKE SURE YOU:    Understand these instructions.   Will watch your condition.   Will get help right away if you are not doing well or get worse.  Document Released: 12/05/2004 Document Revised: 08/27/2011 Document Reviewed: 04/05/2011  Exit[employer] Patient Information 2014 [employer].

## 2013-05-05 NOTE — Telephone Encounter (Signed)
Patient Information:  Caller Name: [name]  Phone: [phone]  Patient: Laura Li, Laura Li  Gender: Female  DOB: 22-Aug-1940  Age: 73 Years  PCP: Phoebe Sharps (Adults only)  Office Follow Up:  Does the office need to follow up with this patient?: No  Instructions For The Office: N/A  RN Note:  Daughter is calling to inform that pt's Ob/Gyn will have her urine culture results on 05/05/13 but the preliminary shows Cipro is resistant. Labs are reviewed which are improving since her last labs. Ob/Gyn did advise to cont the Cipro until the final culture is submitted. Daughter is advised to call back prn.  Symptoms  Reason For Call & Symptoms: med ?  Reviewed Health History In EMR: N/A  Reviewed Medications In EMR: N/A  Reviewed Allergies In EMR: N/A  Reviewed Surgeries / Procedures: N/A  Date of Onset of Symptoms: 05/05/2013  Guideline(s) Used:  No Protocol Available - Information Only  Disposition Per Guideline:   Home Care  Reason For Disposition Reached:   Information only question and nurse able to answer  Advice Given:  Call Back If:  New symptoms develop  You become worse.  Patient Will Follow Care Advice:  YES

## 2013-05-05 NOTE — Progress Notes (Signed)
Subjective:    Patient ID: Laura Li, female    DOB: 14-Aug-1940, 73 y.o.   MRN: [id number]  HPI 73 y.o. White female presents today with chief complaint of "body aches and fever". Pt states that body aches and a head ache began yesterday, she describes the aches as mild and constant; she has not taken anything to relieve the aches. Pt acknowledges a "fever of 100 degree" this morning. Of note, patient is currently being treated for a UTI that was diagnosed on Monday and the physician started her on Macrobid, they have not receive results of her urine culture yet. Acknowledges continued burning with urination. Acknowledges fatigue. Denies weight change, chills, malaise.     Review of Systems  Constitutional: Positive for fever and fatigue.  HENT: Positive for rhinorrhea.   Eyes: Negative.   Respiratory: Positive for cough.        Dry cough  Cardiovascular: Negative.   Gastrointestinal: Negative.   Endocrine: Negative.   Genitourinary: Positive for dysuria.       Currently being treated for UTI  Musculoskeletal: Negative.   Skin: Negative.   Allergic/Immunologic: Negative.   Neurological: Positive for headaches.  Hematological: Negative.   Psychiatric/Behavioral: Negative.    Past Medical History  Diagnosis Date  . OA (osteoarthritis)   . History of DVT (deep vein thrombosis)   . Depression   . Hyperlipidemia   . Hypertension   . Hypothyroidism   . HX: breast cancer     melanoma  . Melanoma   . Eczema   . Arthritis   . Esophageal stricture   . GERD (gastroesophageal reflux disease)     History   Social History  . Marital Status: Widowed    Spouse Name: N/A    Number of Children: N/A  . Years of Education: N/A   Occupational History  . retired    Social History Main Topics  . Smoking status: Former Smoker    Quit date: 03/11/1969  . Smokeless tobacco: Never Used  . Alcohol Use: Yes     Comment: occasional wine  . Drug Use: No  . Sexual Activity: Not on  file   Other Topics Concern  . Not on file   Social History Narrative  . No narrative on file    Past Surgical History  Procedure Laterality Date  . Appendectomy  1967  . Mastectomy  1998    right  . Melanoma excision  2004    right side of face  . Mohs surgery  2005    right left  . Total abdominal hysterectomy  1992  . Tonsillectomy and adenoidectomy  1949  . Breast lumpectomy      left breast  . Breast surgery  1977    removal of calcified milk gland right breast  . Lipoma excision  1978    right breast    Family History  Problem Relation Age of Onset  . Heart disease Father   . Endometrial cancer Mother   . Breast cancer Cousin     x 3  . Lung cancer Paternal Aunt   . Brain cancer Maternal Aunt   . Colon cancer Neg Hx   . Diabetes Maternal Aunt     ???    Allergies  Allergen Reactions  . Dyazide [Hydrochlorothiazide W-Triamterene]     Presumption: drug induced vasculitis  . Niacin   . Penicillins     REACTION: GI upset    Current Outpatient Prescriptions on File Prior  to Visit  Medication Sig Dispense Refill  . acetaminophen (TYLENOL) 500 MG tablet Take 500 mg by mouth every 6 (six) hours as needed.        [name] atorvastatin (LIPITOR) 20 MG tablet Take 1 tablet (20 mg total) by mouth daily.  90 tablet  3  . B Complex-C-Folic Acid (STRESS FORMULA PO) Take 1 tablet by mouth daily.        . Calcium-Vitamin D (CALTRATE 600 PLUS-VIT D PO) Take 2 tablets by mouth daily.       [name] esomeprazole (NEXIUM) 40 MG capsule TAKE 1 CAPSULE DAILY  90 capsule  1  . fexofenadine (ALLEGRA) 180 MG tablet Take 180 mg by mouth daily as needed.       [name] ipratropium (ATROVENT) 0.03 % nasal spray Place 2 sprays into the nose every 12 (twelve) hours.  180 mL  1  . levothyroxine (SYNTHROID, LEVOTHROID) 75 MCG tablet Take 1 tablet (75 mcg total) by mouth daily.  4 tablet  0  . nadolol (CORGARD) 20 MG tablet Take 0.5 tablets (10 mg total) by mouth daily.  4 tablet  0  . venlafaxine XR  (EFFEXOR XR) 37.5 MG 24 hr capsule Take 2 capsules (75 mg total) by mouth daily.  90 capsule  3   No current facility-administered medications on file prior to visit.    BP 120/70  Pulse 84  Temp(Src) 98.3 F (36.8 C)  Ht 5\' 6"  (1.676 m)  Wt 147 lb (66.679 kg)  BMI 23.74 kg/m2chart    Objective:   Physical Exam  Constitutional: She is oriented to person, place, and time. She appears well-developed and well-nourished. She is active.  HENT:  Head: Normocephalic.  Right Ear: Tympanic membrane, external ear and ear canal normal.  Left Ear: Tympanic membrane, external ear and ear canal normal.  Nose: Rhinorrhea present.  Mouth/Throat: Uvula is midline, oropharynx is clear and moist and mucous membranes are normal.  Cardiovascular: Normal rate, regular rhythm, S1 normal, S2 normal and normal heart sounds.   Pulmonary/Chest: Effort normal and breath sounds normal.  Abdominal: Soft. Normal appearance and bowel sounds are normal.  Neurological: She is alert and oriented to person, place, and time.  Skin: Skin is warm, dry and intact.  Psychiatric: She has a normal mood and affect. Her speech is normal and behavior is normal.          Assessment & Plan:  73 y.o. White female presents with chief complaints of "fever and body aches" - Fever: Flu swab (negative)  - Tylenol 650 mg Q6hrs prn for fever and pain.  -UTI:Pt currently being treated by macrobid and awaiting cultures at GYN office.   - Due to complaints of myalgia and fever and continued burning with urination, will change antibiotic to Cipro 500mg  BID for 7 days.   - CBC and CMP to assess infection and kidney function.  -Follow up: Will call with results of labs

## 2013-05-05 NOTE — Progress Notes (Signed)
Pre visit review using our clinic review tool, if applicable. No additional management support is needed unless otherwise documented below in the visit note. 

## 2013-05-17 ENCOUNTER — Encounter (HOSPITAL_COMMUNITY): Payer: Self-pay | Admitting: *Deleted

## 2013-05-17 ENCOUNTER — Inpatient Hospital Stay (HOSPITAL_COMMUNITY)
Admission: AD | Admit: 2013-05-17 | Discharge: 2013-05-17 | Disposition: A | Discharge status: Home / Self Care | Payer: Medicare Other | Source: Ambulatory Visit | Attending: Obstetrics and Gynecology | Admitting: Obstetrics and Gynecology

## 2013-05-17 DIAGNOSIS — A4902 Methicillin resistant Staphylococcus aureus infection, unspecified site: Secondary | ICD-10-CM | POA: Insufficient documentation

## 2013-05-17 DIAGNOSIS — R5383 Other fatigue: Secondary | ICD-10-CM

## 2013-05-17 DIAGNOSIS — M1990 Unspecified osteoarthritis, unspecified site: Secondary | ICD-10-CM | POA: Diagnosis not present

## 2013-05-17 DIAGNOSIS — E039 Hypothyroidism, unspecified: Secondary | ICD-10-CM | POA: Diagnosis not present

## 2013-05-17 DIAGNOSIS — N390 Urinary tract infection, site not specified: Secondary | ICD-10-CM | POA: Insufficient documentation

## 2013-05-17 DIAGNOSIS — Z853 Personal history of malignant neoplasm of breast: Secondary | ICD-10-CM | POA: Diagnosis not present

## 2013-05-17 DIAGNOSIS — E785 Hyperlipidemia, unspecified: Secondary | ICD-10-CM | POA: Insufficient documentation

## 2013-05-17 DIAGNOSIS — Z79899 Other long term (current) drug therapy: Secondary | ICD-10-CM | POA: Diagnosis not present

## 2013-05-17 DIAGNOSIS — I10 Essential (primary) hypertension: Secondary | ICD-10-CM | POA: Insufficient documentation

## 2013-05-17 DIAGNOSIS — F329 Major depressive disorder, single episode, unspecified: Secondary | ICD-10-CM | POA: Insufficient documentation

## 2013-05-17 DIAGNOSIS — Z87891 Personal history of nicotine dependence: Secondary | ICD-10-CM | POA: Diagnosis not present

## 2013-05-17 DIAGNOSIS — F3289 Other specified depressive episodes: Secondary | ICD-10-CM | POA: Diagnosis not present

## 2013-05-17 DIAGNOSIS — K219 Gastro-esophageal reflux disease without esophagitis: Secondary | ICD-10-CM | POA: Diagnosis not present

## 2013-05-17 DIAGNOSIS — R6889 Other general symptoms and signs: Secondary | ICD-10-CM | POA: Diagnosis not present

## 2013-05-17 DIAGNOSIS — L259 Unspecified contact dermatitis, unspecified cause: Secondary | ICD-10-CM | POA: Insufficient documentation

## 2013-05-17 DIAGNOSIS — R5381 Other malaise: Secondary | ICD-10-CM | POA: Insufficient documentation

## 2013-05-17 DIAGNOSIS — Z86718 Personal history of other venous thrombosis and embolism: Secondary | ICD-10-CM | POA: Diagnosis not present

## 2013-05-17 LAB — CBC
HEMATOCRIT: 32.7 % — AB (ref 36.0–46.0)
HEMOGLOBIN: 11.3 g/dL — AB (ref 12.0–15.0)
MCH: 30.5 pg (ref 26.0–34.0)
MCHC: 34.6 g/dL (ref 30.0–36.0)
MCV: 88.1 fL (ref 78.0–100.0)
Platelets: 238 10*3/uL (ref 150–400)
RBC: 3.71 MIL/uL — AB (ref 3.87–5.11)
RDW: 13.5 % (ref 11.5–15.5)
WBC: 5 10*3/uL (ref 4.0–10.5)

## 2013-05-17 LAB — URINALYSIS, ROUTINE W REFLEX MICROSCOPIC
Bilirubin Urine: NEGATIVE
Glucose, UA: NEGATIVE mg/dL
Hgb urine dipstick: NEGATIVE
Ketones, ur: NEGATIVE mg/dL
Leukocytes, UA: NEGATIVE
Nitrite: NEGATIVE
Protein, ur: NEGATIVE mg/dL
Specific Gravity, Urine: 1.025 (ref 1.005–1.030)
Urobilinogen, UA: 0.2 mg/dL (ref 0.0–1.0)
pH: 5.5 (ref 5.0–8.0)

## 2013-05-17 NOTE — MAU Note (Signed)
Pt sent from MD office, has had UTI that has been treated with several antibiotics, was told today she has MRSA.  Pt denies pain, but states she "just doesn't feel good."  Slight nausea, some burning & frequency with urination.

## 2013-05-17 NOTE — MAU Provider Note (Signed)
History     CSN: [id number]  Arrival date and time: 05/17/13 1314   First Provider Initiated Contact with Patient 05/17/13 1502      Chief Complaint  Patient presents with  . Herold Harms    HPI This is a 73 y.o. female who presents from office for evaluation of malaise and some urinary discomfort. Has been treated with several antibiotics then found to have MRSA UTI which was resistant to the first two antbiotics.  It was sensitive to the Septra DS which she is now taking. Was worried her UTI is still not better. Wants to be sure. Office sent her here.  RN Note: Pt sent from MD office, has had UTI that has been treated with several antibiotics, was told today she has MRSA. Pt denies pain, but states she "just doesn't feel good." Slight nausea, some burning & frequency with urination.       OB History   Grav Para Term Preterm Abortions TAB SAB Ect Mult Living   1 1 1       1       Past Medical History  Diagnosis Date  . OA (osteoarthritis)   . History of DVT (deep vein thrombosis)   . Depression   . Hyperlipidemia   . Hypertension   . Hypothyroidism   . HX: breast cancer     melanoma  . Melanoma   . Eczema   . Arthritis   . Esophageal stricture   . GERD (gastroesophageal reflux disease)     Past Surgical History  Procedure Laterality Date  . Appendectomy  1967  . Mastectomy  1998    right  . Melanoma excision  2004    right side of face  . Mohs surgery  2005    right left  . Total abdominal hysterectomy  1992  . Tonsillectomy and adenoidectomy  1949  . Breast lumpectomy      left breast  . Breast surgery  1977    removal of calcified milk gland right breast  . Lipoma excision  1978    right breast    Family History  Problem Relation Age of Onset  . Heart disease Father   . Endometrial cancer Mother   . Breast cancer Cousin     x 3  . Lung cancer Paternal Aunt   . Brain cancer Maternal Aunt   . Colon cancer Neg Hx   . Diabetes Maternal Aunt     ???     History  Substance Use Topics  . Smoking status: Former Smoker    Quit date: 03/11/1969  . Smokeless tobacco: Never Used  . Alcohol Use: Yes     Comment: occasional wine    Allergies:  Allergies  Allergen Reactions  . Dyazide [Hydrochlorothiazide W-Triamterene]     Presumption: drug induced vasculitis  . Niacin Other (See Comments)    flushing  . Penicillins Diarrhea and Nausea And Vomiting    Prescriptions prior to admission  Medication Sig Dispense Refill  . acetaminophen (TYLENOL) 500 MG tablet Take 500 mg by mouth 2 (two) times daily as needed for mild pain.       [name] atorvastatin (LIPITOR) 20 MG tablet Take 1 tablet (20 mg total) by mouth daily.  90 tablet  3  . B Complex-C-Folic Acid (STRESS FORMULA PO) Take 1 tablet by mouth daily.        . Calcium-Vitamin D (CALTRATE 600 PLUS-VIT D PO) Take 1 tablet by mouth 2 (two) times daily.       [name]  esomeprazole (NEXIUM) 40 MG capsule TAKE 1 CAPSULE DAILY  90 capsule  1  . ipratropium (ATROVENT) 0.03 % nasal spray Place 2 sprays into the nose every 12 (twelve) hours.  180 mL  1  . levothyroxine (SYNTHROID, LEVOTHROID) 75 MCG tablet Take 1 tablet (75 mcg total) by mouth daily.  4 tablet  0  . nadolol (CORGARD) 20 MG tablet Take 0.5 tablets (10 mg total) by mouth daily.  4 tablet  0  . Polyethyl Glycol-Propyl Glycol (SYSTANE) 0.4-0.3 % SOLN Apply 1 drop to eye 3 (three) times daily as needed (for dryness/itching).      [name] sulfamethoxazole-trimethoprim (BACTRIM DS) 800-160 MG per tablet Take 1 tablet by mouth 2 (two) times daily. X 7 days      . venlafaxine XR (EFFEXOR-XR) 37.5 MG 24 hr capsule Take 37.5 mg by mouth daily.      . [DISCONTINUED] venlafaxine XR (EFFEXOR XR) 37.5 MG 24 hr capsule Take 2 capsules (75 mg total) by mouth daily.  90 capsule  3    Review of Systems  Constitutional: Positive for malaise/fatigue. Negative for fever and chills.  Gastrointestinal: Negative for nausea, vomiting, abdominal pain, diarrhea and  constipation.  Genitourinary: Positive for frequency. Negative for dysuria.  Musculoskeletal: Negative for myalgias.  Neurological: Negative for headaches.   Physical Exam   Blood pressure 139/56, pulse 64, temperature 0 F (-17.8 C), resp. rate 18.  Physical Exam  Constitutional: She is oriented to person, place, and time. She appears well-developed and well-nourished. No distress.  Cardiovascular: Normal rate.  Exam reveals no gallop and no friction rub.   No murmur heard. Respiratory: Effort normal. No respiratory distress. She has no wheezes. She has no rales. She exhibits no tenderness.  GI: Soft. She exhibits no distension. There is no tenderness. There is no rebound and no guarding.  Musculoskeletal: Normal range of motion.  Neurological: She is alert and oriented to person, place, and time.  Skin: Skin is warm and dry.  Psychiatric: She has a normal mood and affect.   No CVA tenderness MAU Course  Procedures  MDM Results for orders placed during the hospital encounter of 05/17/13 (from the past 24 hour(s))  URINALYSIS, ROUTINE W REFLEX MICROSCOPIC     Status: None   Collection Time    05/17/13  1:30 PM      Result Value Ref Range   Color, Urine YELLOW  YELLOW   APPearance CLEAR  CLEAR   Specific Gravity, Urine 1.025  1.005 - 1.030   pH 5.5  5.0 - 8.0   Glucose, UA NEGATIVE  NEGATIVE mg/dL   Hgb urine dipstick NEGATIVE  NEGATIVE   Bilirubin Urine NEGATIVE  NEGATIVE   Ketones, ur NEGATIVE  NEGATIVE mg/dL   Protein, ur NEGATIVE  NEGATIVE mg/dL   Urobilinogen, UA 0.2  0.0 - 1.0 mg/dL   Nitrite NEGATIVE  NEGATIVE   Leukocytes, UA NEGATIVE  NEGATIVE  CBC     Status: Abnormal   Collection Time    05/17/13  3:35 PM      Result Value Ref Range   WBC 5.0  4.0 - 10.5 K/uL   RBC 3.71 (*) 3.87 - 5.11 MIL/uL   Hemoglobin 11.3 (*) 12.0 - 15.0 g/dL   HCT 32.7 (*) 36.0 - 46.0 %   MCV 88.1  78.0 - 100.0 fL   MCH 30.5  26.0 - 34.0 pg   MCHC 34.6  30.0 - 36.0 g/dL   RDW 13.5   11.5 -  15.5 %   Platelets 238  150 - 400 K/uL     Assessment and Plan  A:  UTI resolving, MRSA, sensitive to Septra DS       Vague abdominal complaints with mild malaise  P:  Reassured her and her daughter that she is on the correct antibiotic       Low WBC confirms no pyelonephritis       Recommend adding probiotics to reestablish gut bacteria       Followup with office and primary doctor [facility] 05/17/2013, 3:17 PM

## 2013-05-17 NOTE — Discharge Instructions (Signed)
Urinary Tract Infection  Urinary tract infections (UTIs) can develop anywhere along your urinary tract. Your urinary tract is your body's drainage system for removing wastes and extra water. Your urinary tract includes two kidneys, two ureters, a bladder, and a urethra. Your kidneys are a pair of bean-shaped organs. Each kidney is about the size of your fist. They are located below your ribs, one on each side of your spine.  CAUSES  Infections are caused by microbes, which are microscopic organisms, including fungi, viruses, and bacteria. These organisms are so small that they can only be seen through a microscope. Bacteria are the microbes that most commonly cause UTIs.  SYMPTOMS   Symptoms of UTIs may vary by age and gender of the patient and by the location of the infection. Symptoms in young women typically include a frequent and intense urge to urinate and a painful, burning feeling in the bladder or urethra during urination. Older women and men are more likely to be tired, shaky, and weak and have muscle aches and abdominal pain. A fever may mean the infection is in your kidneys. Other symptoms of a kidney infection include pain in your back or sides below the ribs, nausea, and vomiting.  DIAGNOSIS  To diagnose a UTI, your caregiver will ask you about your symptoms. Your caregiver also will ask to provide a urine sample. The urine sample will be tested for bacteria and white blood cells. White blood cells are made by your body to help fight infection.  TREATMENT   Typically, UTIs can be treated with medication. Because most UTIs are caused by a bacterial infection, they usually can be treated with the use of antibiotics. The choice of antibiotic and length of treatment depend on your symptoms and the type of bacteria causing your infection.  HOME CARE INSTRUCTIONS   If you were prescribed antibiotics, take them exactly as your caregiver instructs you. Finish the medication even if you feel better after you  have only taken some of the medication.   Drink enough water and fluids to keep your urine clear or pale yellow.   Avoid caffeine, tea, and carbonated beverages. They tend to irritate your bladder.   Empty your bladder often. Avoid holding urine for long periods of time.   Empty your bladder before and after sexual intercourse.   After a bowel movement, women should cleanse from front to back. Use each tissue only once.  SEEK MEDICAL CARE IF:    You have back pain.   You develop a fever.   Your symptoms do not begin to resolve within 3 days.  SEEK IMMEDIATE MEDICAL CARE IF:    You have severe back pain or lower abdominal pain.   You develop chills.   You have nausea or vomiting.   You have continued burning or discomfort with urination.  MAKE SURE YOU:    Understand these instructions.   Will watch your condition.   Will get help right away if you are not doing well or get worse.  Document Released: 12/05/2004 Document Revised: 08/27/2011 Document Reviewed: 04/05/2011  [employer] Patient Information 2014 [employer].

## 2013-05-31 DIAGNOSIS — R6889 Other general symptoms and signs: Secondary | ICD-10-CM | POA: Diagnosis not present

## 2013-06-18 ENCOUNTER — Ambulatory Visit: Payer: Medicare Other | Admitting: Internal Medicine

## 2013-06-21 ENCOUNTER — Encounter: Payer: Self-pay | Admitting: Internal Medicine

## 2013-06-21 ENCOUNTER — Ambulatory Visit (INDEPENDENT_AMBULATORY_CARE_PROVIDER_SITE_OTHER): Payer: Medicare Other | Admitting: Internal Medicine

## 2013-06-21 VITALS — BP 126/74 | HR 68 | Temp 97.7°F | Ht 66.0 in | Wt 145.0 lb

## 2013-06-21 DIAGNOSIS — E039 Hypothyroidism, unspecified: Secondary | ICD-10-CM | POA: Diagnosis not present

## 2013-06-21 DIAGNOSIS — I776 Arteritis, unspecified: Secondary | ICD-10-CM | POA: Diagnosis not present

## 2013-06-21 DIAGNOSIS — E785 Hyperlipidemia, unspecified: Secondary | ICD-10-CM

## 2013-06-21 DIAGNOSIS — R350 Frequency of micturition: Secondary | ICD-10-CM | POA: Diagnosis not present

## 2013-06-21 DIAGNOSIS — I10 Essential (primary) hypertension: Secondary | ICD-10-CM

## 2013-06-21 DIAGNOSIS — F329 Major depressive disorder, single episode, unspecified: Secondary | ICD-10-CM

## 2013-06-21 DIAGNOSIS — N289 Disorder of kidney and ureter, unspecified: Secondary | ICD-10-CM | POA: Diagnosis not present

## 2013-06-21 DIAGNOSIS — F3289 Other specified depressive episodes: Secondary | ICD-10-CM

## 2013-06-21 LAB — POCT URINALYSIS DIPSTICK
Bilirubin, UA: NEGATIVE
Blood, UA: NEGATIVE
GLUCOSE UA: NEGATIVE
Ketones, UA: NEGATIVE
Nitrite, UA: NEGATIVE
PROTEIN UA: NEGATIVE
Spec Grav, UA: <=1.005
UROBILINOGEN UA: 0.2
pH, UA: 5

## 2013-06-21 LAB — HEPATIC FUNCTION PANEL
ALK PHOS: 62 U/L (ref 39–117)
ALT: 17 U/L (ref 0–35)
AST: 22 U/L (ref 0–37)
Albumin: 4.3 g/dL (ref 3.5–5.2)
Bilirubin, Direct: 0 mg/dL (ref 0.0–0.3)
Total Bilirubin: 0.6 mg/dL (ref 0.3–1.2)
Total Protein: 7.9 g/dL (ref 6.0–8.3)

## 2013-06-21 LAB — LIPID PANEL
Cholesterol: 215 mg/dL — ABNORMAL HIGH (ref 0–200)
HDL: 40.1 mg/dL (ref >39.00)
LDL CALC: 124 mg/dL — AB (ref 0–99)
Total CHOL/HDL Ratio: 5
Triglycerides: 253 mg/dL — ABNORMAL HIGH (ref 0.0–149.0)
VLDL: 50.6 mg/dL — AB (ref 0.0–40.0)

## 2013-06-21 MED ORDER — VENLAFAXINE HCL ER 75 MG PO CP24: 75.0000 mg | ORAL_CAPSULE | Freq: Every day | ORAL | Status: DC

## 2013-06-21 NOTE — Progress Notes (Signed)
Pre visit review using our clinic review tool, if applicable. No additional management support is needed unless otherwise documented below in the visit note. 

## 2013-06-21 NOTE — Assessment & Plan Note (Signed)
Check labs today.

## 2013-06-21 NOTE — Telephone Encounter (Signed)
rx for effexor sent to [facility] per pt request

## 2013-06-21 NOTE — Progress Notes (Signed)
Has had recent vaginal infection and then UTI- followed by Dr. Philis Pique.   She had a MRSA infection- UTI she is concerned.   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  Reviewed pmh, psh, sochx, meds  Ros: complaints as above, no other complaints  BP 126/74  Pulse 68  Temp(Src) 97.7 F (36.5 C) (Oral)  Ht 5\' 6"  (1.676 m)  Wt 145 lb (65.772 kg)  BMI 23.41 kg/m2  Well-developed well-nourished female in no acute distress. HEENT exam atraumatic, normocephalic, extraocular muscles are intact. Neck is supple. No jugular venous distention no thyromegaly. Chest clear to auscultation without increased work of breathing. Cardiac exam S1 and S2 are regular. Abdominal exam active bowel sounds, soft, nontender. Extremities no edema. Neurologic exam she is alert without any motor sensory deficits. Gait is normal.

## 2013-06-21 NOTE — Assessment & Plan Note (Signed)
Controlled- continue same meds

## 2013-06-21 NOTE — Assessment & Plan Note (Signed)
Increased dose of effexor based on worsening depression

## 2013-06-21 NOTE — Assessment & Plan Note (Signed)
resolved 

## 2013-06-21 NOTE — Assessment & Plan Note (Addendum)
This is likely resolved- note normal creatinine

## 2013-06-23 LAB — URINE CULTURE
COLONY COUNT: NO GROWTH
ORGANISM ID, BACTERIA: NO GROWTH

## 2013-07-14 ENCOUNTER — Telehealth: Payer: Self-pay | Admitting: Internal Medicine

## 2013-07-14 MED ORDER — IPRATROPIUM BROMIDE 0.03 % NA SOLN: 2.0000 | Freq: Two times a day (BID) | NASAL | Status: DC

## 2013-07-14 NOTE — Telephone Encounter (Signed)
[facility] - [place], [place] is requesting 90 day re-fill on ipratropium (ATROVENT) 0.03 % nasal spray

## 2013-07-14 NOTE — Telephone Encounter (Signed)
rx sent in electronically 

## 2013-07-29 DIAGNOSIS — H2510 Age-related nuclear cataract, unspecified eye: Secondary | ICD-10-CM | POA: Diagnosis not present

## 2013-10-12 ENCOUNTER — Telehealth: Payer: Self-pay | Admitting: Internal Medicine

## 2013-10-12 NOTE — Telephone Encounter (Addendum)
Pt would like to know if you will accept her as a pt? Pt was a [facility] pt.

## 2013-10-18 NOTE — Telephone Encounter (Signed)
Let's encourage these folks to try one of our providers who do not have full panels.

## 2013-10-19 NOTE — Telephone Encounter (Signed)
Pt is aware, but didn't want to schedule with another provider.

## 2013-10-25 MED ORDER — ESOMEPRAZOLE MAGNESIUM 40 MG PO CPDR: DELAYED_RELEASE_CAPSULE | ORAL | Status: DC

## 2013-10-25 NOTE — Telephone Encounter (Signed)
Pt has decided to go w/ dr hunter, but needs refill on  esomeprazole (NEXIUM) 40 MG capsule 90 day sent to [facility] Can you send this for pt? Pt has fup on meds w/ [facility] in oct, then OV w/ hunter in Jan.

## 2013-10-25 NOTE — Telephone Encounter (Signed)
rx sent in electronically 

## 2013-11-03 DIAGNOSIS — N760 Acute vaginitis: Secondary | ICD-10-CM | POA: Diagnosis not present

## 2013-12-09 DIAGNOSIS — D485 Neoplasm of uncertain behavior of skin: Secondary | ICD-10-CM | POA: Diagnosis not present

## 2013-12-09 DIAGNOSIS — D2371 Other benign neoplasm of skin of right lower limb, including hip: Secondary | ICD-10-CM | POA: Diagnosis not present

## 2013-12-09 DIAGNOSIS — Z85820 Personal history of malignant melanoma of skin: Secondary | ICD-10-CM | POA: Diagnosis not present

## 2013-12-09 DIAGNOSIS — L570 Actinic keratosis: Secondary | ICD-10-CM | POA: Diagnosis not present

## 2013-12-09 DIAGNOSIS — L821 Other seborrheic keratosis: Secondary | ICD-10-CM | POA: Diagnosis not present

## 2013-12-14 DIAGNOSIS — N952 Postmenopausal atrophic vaginitis: Secondary | ICD-10-CM | POA: Diagnosis not present

## 2013-12-21 ENCOUNTER — Ambulatory Visit (INDEPENDENT_AMBULATORY_CARE_PROVIDER_SITE_OTHER): Payer: Medicare Other | Admitting: Physician Assistant

## 2013-12-21 ENCOUNTER — Encounter: Payer: Self-pay | Admitting: Physician Assistant

## 2013-12-21 VITALS — BP 110/80 | HR 60 | Temp 97.7°F | Resp 18 | Wt 144.6 lb

## 2013-12-21 DIAGNOSIS — J309 Allergic rhinitis, unspecified: Secondary | ICD-10-CM

## 2013-12-21 DIAGNOSIS — Z23 Encounter for immunization: Secondary | ICD-10-CM | POA: Diagnosis not present

## 2013-12-21 DIAGNOSIS — I10 Essential (primary) hypertension: Secondary | ICD-10-CM

## 2013-12-21 DIAGNOSIS — E785 Hyperlipidemia, unspecified: Secondary | ICD-10-CM | POA: Diagnosis not present

## 2013-12-21 DIAGNOSIS — K219 Gastro-esophageal reflux disease without esophagitis: Secondary | ICD-10-CM

## 2013-12-21 DIAGNOSIS — E039 Hypothyroidism, unspecified: Secondary | ICD-10-CM

## 2013-12-21 LAB — LDL CHOLESTEROL, DIRECT: Direct LDL: 124.7 mg/dL

## 2013-12-21 LAB — CBC WITH DIFFERENTIAL/PLATELET
BASOS ABS: 0 10*3/uL (ref 0.0–0.1)
Basophils Relative: 0.6 % (ref 0.0–3.0)
Eosinophils Absolute: 0.2 10*3/uL (ref 0.0–0.7)
Eosinophils Relative: 2.7 % (ref 0.0–5.0)
HCT: 37.6 % (ref 36.0–46.0)
Hemoglobin: 12.7 g/dL (ref 12.0–15.0)
LYMPHS PCT: 29.1 % (ref 12.0–46.0)
Lymphs Abs: 1.6 10*3/uL (ref 0.7–4.0)
MCHC: 33.8 g/dL (ref 30.0–36.0)
MCV: 89.7 fl (ref 78.0–100.0)
Monocytes Absolute: 0.5 10*3/uL (ref 0.1–1.0)
Monocytes Relative: 9.1 % (ref 3.0–12.0)
NEUTROS PCT: 58.5 % (ref 43.0–77.0)
Neutro Abs: 3.3 10*3/uL (ref 1.4–7.7)
Platelets: 248 10*3/uL (ref 150.0–400.0)
RBC: 4.19 Mil/uL (ref 3.87–5.11)
RDW: 13.6 % (ref 11.5–15.5)
WBC: 5.6 10*3/uL (ref 4.0–10.5)

## 2013-12-21 LAB — LIPID PANEL
Cholesterol: 215 mg/dL — ABNORMAL HIGH (ref 0–200)
HDL: 37.7 mg/dL — AB (ref >39.00)
NONHDL: 177.3
Total CHOL/HDL Ratio: 6
Triglycerides: 242 mg/dL — ABNORMAL HIGH (ref 0.0–149.0)
VLDL: 48.4 mg/dL — ABNORMAL HIGH (ref 0.0–40.0)

## 2013-12-21 LAB — COMPREHENSIVE METABOLIC PANEL
ALBUMIN: 4.2 g/dL (ref 3.5–5.2)
ALT: 19 U/L (ref 0–35)
AST: 24 U/L (ref 0–37)
Alkaline Phosphatase: 64 U/L (ref 39–117)
BUN: 22 mg/dL (ref 6–23)
CALCIUM: 10.6 mg/dL — AB (ref 8.4–10.5)
CHLORIDE: 101 meq/L (ref 96–112)
CO2: 29 mEq/L (ref 19–32)
Creatinine, Ser: 1.2 mg/dL (ref 0.4–1.2)
GFR: 47.45 mL/min — ABNORMAL LOW (ref >60.00)
Glucose, Bld: 83 mg/dL (ref 70–99)
POTASSIUM: 4.4 meq/L (ref 3.5–5.1)
Sodium: 137 mEq/L (ref 135–145)
Total Bilirubin: 0.8 mg/dL (ref 0.2–1.2)
Total Protein: 8.5 g/dL — ABNORMAL HIGH (ref 6.0–8.3)

## 2013-12-21 LAB — TSH: TSH: 1.45 u[IU]/mL (ref 0.35–4.50)

## 2013-12-21 MED ORDER — ESOMEPRAZOLE MAGNESIUM 40 MG PO CPDR: DELAYED_RELEASE_CAPSULE | ORAL | Status: DC

## 2013-12-21 MED ORDER — IPRATROPIUM BROMIDE 0.03 % NA SOLN: 2.0000 | Freq: Two times a day (BID) | NASAL | Status: DC

## 2013-12-21 MED ORDER — NADOLOL 20 MG PO TABS: 10.0000 mg | ORAL_TABLET | Freq: Every day | ORAL | Status: DC

## 2013-12-21 MED ORDER — LEVOTHYROXINE SODIUM 75 MCG PO TABS: 75.0000 ug | ORAL_TABLET | Freq: Every day | ORAL | Status: DC

## 2013-12-21 NOTE — Progress Notes (Signed)
Subjective:    Patient ID: Laura Li, female    DOB: Jul 19, 1940, 73 y.o.   MRN: [id number]  HPI Patient is a 73 y.o. female presenting for medication management. HTN- The pt is currently being treated with nadolol alone. She states that she is tolerating this well and denies adverse effects to treatment.  Hypothyroidism- Stable on synthroid 33mcg for years. She states that she is tolerating this well and denies adverse effects to treatment.  GERD- per pt, well controlled with nexium. Has seen GI in the past for severe reflux. She states that she is tolerating this well and denies adverse effects to treatment.  Chronic Environmental allergies- for this, the pt takes both OTC allegra and rx ipratropium nasal spray. She states that she is tolerating this well and denies adverse effects to treatment.  HLD- pt currently taking lipitor. She states that she is tolerating this well and denies adverse effects to treatment.  Review of Systems Patient denies fevers, chills, nausea, vomiting, diarrhea, chest pain, shortness of breath, orthopnea, headache, syncope. Denies lower extremity edema, abdominal pain, change in appetite, change in bowel movements. Patient denies rashes, musculoskeletal complaints. No other specific complaints in a complete review of systems.    Past Medical History  Diagnosis Date  . OA (osteoarthritis)   . History of DVT (deep vein thrombosis)   . Depression   . Hyperlipidemia   . Hypertension   . Hypothyroidism   . HX: breast cancer     melanoma  . Melanoma   . Eczema   . Arthritis   . Esophageal stricture   . GERD (gastroesophageal reflux disease)     History   Social History  . Marital Status: Widowed    Spouse Name: N/A    Number of Children: N/A  . Years of Education: N/A   Occupational History  . retired    Social History Main Topics  . Smoking status: Former Smoker    Quit date: 03/11/1969  . Smokeless tobacco: Never Used  . Alcohol Use:  Yes     Comment: occasional wine  . Drug Use: No  . Sexual Activity: No   Other Topics Concern  . Not on file   Social History Narrative  . No narrative on file    Past Surgical History  Procedure Laterality Date  . Appendectomy  1967  . Mastectomy  1998    right  . Melanoma excision  2004    right side of face  . Mohs surgery  2005    right left  . Total abdominal hysterectomy  1992  . Tonsillectomy and adenoidectomy  1949  . Breast lumpectomy      left breast  . Breast surgery  1977    removal of calcified milk gland right breast  . Lipoma excision  1978    right breast    Family History  Problem Relation Age of Onset  . Heart disease Father   . Endometrial cancer Mother   . Breast cancer Cousin     x 3  . Lung cancer Paternal Aunt   . Brain cancer Maternal Aunt   . Colon cancer Neg Hx   . Diabetes Maternal Aunt     ???    Allergies  Allergen Reactions  . Dyazide [Hydrochlorothiazide W-Triamterene]     Presumption: drug induced vasculitis  . Niacin Other (See Comments)    flushing  . Penicillins Diarrhea and Nausea And Vomiting    Current Outpatient Prescriptions on  File Prior to Visit  Medication Sig Dispense Refill  . acetaminophen (TYLENOL) 500 MG tablet Take 500 mg by mouth 2 (two) times daily as needed for mild pain.       [name] atorvastatin (LIPITOR) 20 MG tablet Take 1 tablet (20 mg total) by mouth daily.  90 tablet  3  . B Complex-C-Folic Acid (STRESS FORMULA PO) Take 1 tablet by mouth daily.        . Calcium-Vitamin D (CALTRATE 600 PLUS-VIT D PO) Take 1 tablet by mouth 2 (two) times daily.       [name] esomeprazole (NEXIUM) 40 MG capsule TAKE 1 CAPSULE DAILY  90 capsule  0  . fexofenadine (ALLEGRA) 180 MG tablet Take 180 mg by mouth daily.      [name] ipratropium (ATROVENT) 0.03 % nasal spray Place 2 sprays into the nose every 12 (twelve) hours.  180 mL  1  . levothyroxine (SYNTHROID, LEVOTHROID) 75 MCG tablet Take 1 tablet (75 mcg total) by mouth daily.  4  tablet  0  . nadolol (CORGARD) 20 MG tablet Take 0.5 tablets (10 mg total) by mouth daily.  4 tablet  0  . Polyethyl Glycol-Propyl Glycol (SYSTANE) 0.4-0.3 % SOLN Apply 1 drop to eye 3 (three) times daily as needed (for dryness/itching).      . venlafaxine XR (EFFEXOR-XR) 75 MG 24 hr capsule Take 1 capsule (75 mg total) by mouth daily.  90 capsule  3   No current facility-administered medications on file prior to visit.    EXAM: BP 110/80  Pulse 60  Temp(Src) 97.7 F (36.5 C) (Oral)  Resp 18  Wt 144 lb 9.6 oz (65.59 kg)      Objective:   Physical Exam  Nursing note and vitals reviewed. Constitutional: She is oriented to person, place, and time. She appears well-developed and well-nourished. No distress.  HENT:  Head: Normocephalic and atraumatic.  Eyes: Conjunctivae and EOM are normal.  Cardiovascular: Normal rate, regular rhythm and intact distal pulses.   Pulmonary/Chest: Effort normal and breath sounds normal. No respiratory distress. She exhibits no tenderness.  Musculoskeletal: She exhibits no edema.  Neurological: She is alert and oriented to person, place, and time.  Skin: Skin is warm and dry. She is not diaphoretic. No pallor.  Psychiatric: She has a normal mood and affect. Her behavior is normal. Judgment and thought content normal.     Lab Results  Component Value Date   WBC 5.0 05/17/2013   HGB 11.3* 05/17/2013   HCT 32.7* 05/17/2013   PLT 238 05/17/2013   GLUCOSE 114* 05/05/2013   CHOL 215* 06/21/2013   TRIG 253.0* 06/21/2013   HDL 40.10 06/21/2013   LDLDIRECT 157.6 03/19/2013   LDLCALC 124* 06/21/2013   ALT 17 06/21/2013   AST 22 06/21/2013   NA 135 05/05/2013   K 3.6 05/05/2013   CL 100 05/05/2013   CREATININE 1.1 05/05/2013   BUN 22 05/05/2013   CO2 27 05/05/2013   TSH 2.72 03/19/2013        Assessment & Plan:  [place] was seen today for follow-up.  Diagnoses and associated orders for this visit:  Essential hypertension Comments: Stable on current therapy, will  continue. - nadolol (CORGARD) 20 MG tablet; Take 0.5 tablets (10 mg total) by mouth daily. - CBC with Differential - CMP  Allergic rhinitis, unspecified allergic rhinitis type Comments: Stable on current therapy, will continue. - ipratropium (ATROVENT) 0.03 % nasal spray; Place 2 sprays into the nose every 12 (twelve)  hours. - CBC with Differential  Hypothyroidism, unspecified hypothyroidism type Comments: Stable on current therapy will continue. - levothyroxine (SYNTHROID, LEVOTHROID) 75 MCG tablet; Take 1 tablet (75 mcg total) by mouth daily. - CBC with Differential - TSH  Dyslipidemia Comments: Stable therapy, will continue. - CBC with Differential - Lipid Panel  Gastroesophageal reflux disease, esophagitis presence not specified Comments: Stable on current therapy, will continue. - esomeprazole (NEXIUM) 40 MG capsule; TAKE 1 CAPSULE DAILY - CBC with Differential    Pt will establish with new PCP in January.  Return precautions provided, and patient handout on htn, hypothyroid.  Plan to follow up as needed, or for worsening or persistent symptoms despite treatment.  Patient Instructions  We will call with your lab results when available.   Continue current medications as directed.  Keep appointment to establish with new PCP in January.  If emergency symptoms discussed during visit developed, seek medical attention immediately.  Followup as needed, or for worsening or persistent symptoms despite treatment.

## 2013-12-21 NOTE — Progress Notes (Signed)
Pre visit review using our clinic review tool, if applicable. No additional management support is needed unless otherwise documented below in the visit note. 

## 2013-12-21 NOTE — Patient Instructions (Addendum)
We will call with your lab results when available.   Continue current medications as directed.  Keep appointment to establish with new PCP in January.  If emergency symptoms discussed during visit developed, seek medical attention immediately.  Followup as needed, or for worsening or persistent symptoms despite treatment.     Hypertension Hypertension is another name for high blood pressure. High blood pressure forces your heart to work harder to pump blood. A blood pressure reading has two numbers, which includes a higher number over a lower number (example: 110/72). HOME CARE   Have your blood pressure rechecked by your doctor.  Only take medicine as told by your doctor. Follow the directions carefully. The medicine does not work as well if you skip doses. Skipping doses also puts you at risk for problems.  Do not smoke.  Monitor your blood pressure at home as told by your doctor. GET HELP IF:  You think you are having a reaction to the medicine you are taking.  You have repeat headaches or feel dizzy.  You have puffiness (swelling) in your ankles.  You have trouble with your vision. GET HELP RIGHT AWAY IF:   You get a very bad headache and are confused.  You feel weak, numb, or faint.  You get chest or belly (abdominal) pain.  You throw up (vomit).  You cannot breathe very well. MAKE SURE YOU:   Understand these instructions.  Will watch your condition.  Will get help right away if you are not doing well or get worse. Document Released: 08/14/2007 Document Revised: 03/02/2013 Document Reviewed: 12/18/2012 [facility] Patient Information 2015 [place], [place]. This information is not intended to replace advice given to you by your health care provider. Make sure you discuss any questions you have with your health care provider. Hypothyroidism The thyroid is a large gland located in the lower front of your neck. The thyroid gland helps control metabolism. Metabolism  is how your body handles food. It controls metabolism with the hormone thyroxine. When this gland is underactive (hypothyroid), it produces too little hormone.  CAUSES These include:   Absence or destruction of thyroid tissue.  Goiter due to iodine deficiency.  Goiter due to medications.  Congenital defects (since birth).  Problems with the pituitary. This causes a lack of TSH (thyroid stimulating hormone). This hormone tells the thyroid to turn out more hormone. SYMPTOMS  Lethargy (feeling as though you have no energy)  Cold intolerance  Weight gain (in spite of normal food intake)  Dry skin  Coarse hair  Menstrual irregularity (if severe, may lead to infertility)  Slowing of thought processes Cardiac problems are also caused by insufficient amounts of thyroid hormone. Hypothyroidism in the newborn is cretinism, and is an extreme form. It is important that this form be treated adequately and immediately or it will lead rapidly to retarded physical and mental development. DIAGNOSIS  To prove hypothyroidism, your caregiver may do blood tests and ultrasound tests. Sometimes the signs are hidden. It may be necessary for your caregiver to watch this illness with blood tests either before or after diagnosis and treatment. TREATMENT  Low levels of thyroid hormone are increased by using synthetic thyroid hormone. This is a safe, effective treatment. It usually takes about four weeks to gain the full effects of the medication. After you have the full effect of the medication, it will generally take another four weeks for problems to leave. Your caregiver may start you on low doses. If you have had heart  problems the dose may be gradually increased. It is generally not an emergency to get rapidly to normal. HOME CARE INSTRUCTIONS   Take your medications as your caregiver suggests. Let your caregiver know of any medications you are taking or start taking. Your caregiver will help you with  dosage schedules.  As your condition improves, your dosage needs may increase. It will be necessary to have continuing blood tests as suggested by your caregiver.  Report all suspected medication side effects to your caregiver. SEEK MEDICAL CARE IF: Seek medical care if you develop:  Sweating.  Tremulousness (tremors).  Anxiety.  Rapid weight loss.  Heat intolerance.  Emotional swings.  Diarrhea.  Weakness. SEEK IMMEDIATE MEDICAL CARE IF:  You develop chest pain, an irregular heart beat (palpitations), or a rapid heart beat. MAKE SURE YOU:   Understand these instructions.  Will watch your condition.  Will get help right away if you are not doing well or get worse. Document Released: 02/25/2005 Document Revised: 05/20/2011 Document Reviewed: 10/16/2007 [facility] Patient Information 2015 [place], [place]. This information is not intended to replace advice given to you by your health care provider. Make sure you discuss any questions you have with your health care provider.

## 2013-12-22 ENCOUNTER — Telehealth: Payer: Self-pay | Admitting: Internal Medicine

## 2013-12-22 NOTE — Telephone Encounter (Signed)
emmi emailed °

## 2014-01-10 ENCOUNTER — Encounter: Payer: Self-pay | Admitting: Physician Assistant

## 2014-02-08 ENCOUNTER — Other Ambulatory Visit: Payer: Self-pay

## 2014-02-08 DIAGNOSIS — Z9011 Acquired absence of right breast and nipple: Secondary | ICD-10-CM

## 2014-02-08 DIAGNOSIS — Z1231 Encounter for screening mammogram for malignant neoplasm of breast: Secondary | ICD-10-CM

## 2014-02-17 DIAGNOSIS — H2513 Age-related nuclear cataract, bilateral: Secondary | ICD-10-CM | POA: Diagnosis not present

## 2014-02-17 DIAGNOSIS — H524 Presbyopia: Secondary | ICD-10-CM | POA: Diagnosis not present

## 2014-03-08 ENCOUNTER — Ambulatory Visit
Admission: RE | Admit: 2014-03-08 | Discharge: 2014-03-08 | Disposition: A | Discharge status: Home / Self Care | Payer: Medicare Other | Source: Ambulatory Visit

## 2014-03-08 DIAGNOSIS — Z1231 Encounter for screening mammogram for malignant neoplasm of breast: Secondary | ICD-10-CM | POA: Diagnosis not present

## 2014-03-08 DIAGNOSIS — Z9011 Acquired absence of right breast and nipple: Secondary | ICD-10-CM

## 2014-03-11 DIAGNOSIS — Z9221 Personal history of antineoplastic chemotherapy: Secondary | ICD-10-CM

## 2014-03-11 DIAGNOSIS — Z923 Personal history of irradiation: Secondary | ICD-10-CM

## 2014-03-11 HISTORY — PX: BREAST LUMPECTOMY: SHX2

## 2014-03-11 HISTORY — DX: Personal history of antineoplastic chemotherapy: Z92.21

## 2014-03-11 HISTORY — DX: Personal history of irradiation: Z92.3

## 2014-03-17 ENCOUNTER — Encounter: Payer: Self-pay | Admitting: Internal Medicine

## 2014-03-17 DIAGNOSIS — E039 Hypothyroidism, unspecified: Secondary | ICD-10-CM

## 2014-03-18 MED ORDER — LEVOTHYROXINE SODIUM 75 MCG PO TABS: 75.0000 ug | ORAL_TABLET | Freq: Every day | ORAL | Status: DC

## 2014-03-21 ENCOUNTER — Other Ambulatory Visit: Payer: Self-pay | Admitting: Obstetrics and Gynecology

## 2014-03-21 DIAGNOSIS — R928 Other abnormal and inconclusive findings on diagnostic imaging of breast: Secondary | ICD-10-CM

## 2014-03-29 ENCOUNTER — Ambulatory Visit
Admission: RE | Admit: 2014-03-29 | Discharge: 2014-03-29 | Disposition: A | Discharge status: Home / Self Care | Payer: Medicare Other | Source: Ambulatory Visit | Attending: Obstetrics and Gynecology | Admitting: Obstetrics and Gynecology

## 2014-03-29 ENCOUNTER — Other Ambulatory Visit: Payer: Self-pay | Admitting: Obstetrics and Gynecology

## 2014-03-29 DIAGNOSIS — N6320 Unspecified lump in the left breast, unspecified quadrant: Secondary | ICD-10-CM

## 2014-03-29 DIAGNOSIS — R928 Other abnormal and inconclusive findings on diagnostic imaging of breast: Secondary | ICD-10-CM

## 2014-03-29 DIAGNOSIS — N63 Unspecified lump in breast: Secondary | ICD-10-CM | POA: Diagnosis not present

## 2014-03-29 DIAGNOSIS — Z853 Personal history of malignant neoplasm of breast: Secondary | ICD-10-CM | POA: Diagnosis not present

## 2014-03-31 ENCOUNTER — Other Ambulatory Visit: Payer: Self-pay | Admitting: Obstetrics and Gynecology

## 2014-03-31 ENCOUNTER — Ambulatory Visit (INDEPENDENT_AMBULATORY_CARE_PROVIDER_SITE_OTHER): Payer: Medicare Other | Admitting: Family Medicine

## 2014-03-31 ENCOUNTER — Encounter: Payer: Self-pay | Admitting: Family Medicine

## 2014-03-31 VITALS — BP 120/82 | Temp 97.8°F | Wt 148.0 lb

## 2014-03-31 DIAGNOSIS — I10 Essential (primary) hypertension: Secondary | ICD-10-CM

## 2014-03-31 DIAGNOSIS — M1990 Unspecified osteoarthritis, unspecified site: Secondary | ICD-10-CM | POA: Insufficient documentation

## 2014-03-31 DIAGNOSIS — K219 Gastro-esophageal reflux disease without esophagitis: Secondary | ICD-10-CM

## 2014-03-31 DIAGNOSIS — N183 Chronic kidney disease, stage 3 unspecified: Secondary | ICD-10-CM

## 2014-03-31 DIAGNOSIS — E038 Other specified hypothyroidism: Secondary | ICD-10-CM

## 2014-03-31 DIAGNOSIS — E034 Atrophy of thyroid (acquired): Secondary | ICD-10-CM

## 2014-03-31 DIAGNOSIS — N6320 Unspecified lump in the left breast, unspecified quadrant: Secondary | ICD-10-CM

## 2014-03-31 DIAGNOSIS — C439 Malignant melanoma of skin, unspecified: Secondary | ICD-10-CM | POA: Insufficient documentation

## 2014-03-31 DIAGNOSIS — R232 Flushing: Secondary | ICD-10-CM | POA: Insufficient documentation

## 2014-03-31 MED ORDER — ESOMEPRAZOLE MAGNESIUM 40 MG PO CPDR
DELAYED_RELEASE_CAPSULE | ORAL | Status: DC
Start: 2014-03-31 — End: 2015-03-14

## 2014-03-31 NOTE — Progress Notes (Signed)
Laura Reddish, MD Phone: [phone]  Subjective:  Patient presents today to establish care with me as their new primary care provider. Patient was formerly a patient of Dr. Leanne Chang. Chief complaint-noted.   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 Patient very concerned about new medications. She has had great improvement in cr from 1.7 down to 1.1 or 1.2 but has never returned to baseline 0.8 or so.  ROS-no dysuria, polyuria, urine changes  GERD-poor control -ran out of nexium at end of the year as went into the donut hole. Tried OTC nexium 20mg  and significant issues including worsening indigestion/reflux symptoms.  ROS- no exertional chest pain or shortness of breath  Hypertension-controlled  BP Readings from Last 3 Encounters:  03/31/14 120/82  12/21/13 110/80  06/21/13 126/74  Home BP monitoring-no Compliant with medications-yes without side effects ROS-Denies any CP, HA, SOB, blurry vision  Hypothyroidism-controlled On thyroid medication-levothyroxine 50mcg ROS-No hair or nail changes. No heat/cold intolerance. No constipation or diarrhea. Denies shakiness.   The following were reviewed and entered/updated in epic: Past Medical History  Diagnosis Date  . OA (osteoarthritis)   . History of DVT (deep vein thrombosis)   . Depression   . Hyperlipidemia   . Hypertension   . Hypothyroidism   . HX: breast cancer     melanoma  . Melanoma   . Eczema   . Arthritis   . Esophageal stricture   . GERD (gastroesophageal reflux disease)    Patient Active Problem List   Diagnosis Date Noted  . 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 BREAST CANCER, HX OF 02/06/2006    Priority: High  . Melanoma of skin 03/31/2014    Priority: Medium  . Chronic kidney disease, stage III (moderate) 12/09/2012    Priority: Medium  . Anemia 12/09/2012    Priority: Medium  . DVT, HX OF 12/30/2006    Priority: Medium  . Hypothyroidism 02/06/2006    Priority: Medium   . Dyslipidemia 02/06/2006    Priority: Medium  . Essential hypertension 02/06/2006    Priority: Medium  . GERD (gastroesophageal reflux disease) 03/31/2014    Priority: Low  . Hot flashes 03/31/2014    Priority: Low  . Osteoarthritis 03/31/2014    Priority: Low  . Allergic rhinitis 02/06/2006    Priority: Low  . Osteopenia 02/06/2006    Priority: Low   Past Surgical History  Procedure Laterality Date  . Appendectomy  1967  . Mastectomy  1998    right  . Melanoma excision  2004    right side of face  . 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 Total abdominal hysterectomy  1992    including ovaries  . Tonsillectomy and adenoidectomy  1949  . Breast lumpectomy      left breast-benign  . Breast surgery  1977    removal of calcified milk gland right breast  . Lipoma excision  1978    right breast    Family History  Problem Relation Age of Onset  . Heart disease Father 47    smoker  . Endometrial cancer Mother   . Breast cancer Cousin     x 3  . Lung cancer Paternal Aunt   . Brain cancer Maternal Aunt   . Colon cancer Neg Hx   . Diabetes Maternal Aunt     ???    Medications- reviewed and updated Current Outpatient Prescriptions  Medication Sig Dispense Refill  .  atorvastatin (LIPITOR) 20 MG tablet Take 1 tablet (20 mg total) by mouth daily. 90 tablet 3  . B Complex-C-Folic Acid (STRESS FORMULA PO) Take 1 tablet by mouth daily.      . Calcium-Vitamin D (CALTRATE 600 PLUS-VIT D PO) Take 1 tablet by mouth 2 (two) times daily.     [name] esomeprazole (NEXIUM) 40 MG capsule TAKE 1 CAPSULE DAILY 90 capsule 1  . fexofenadine (ALLEGRA) 180 MG tablet Take 180 mg by mouth daily.    [name] ipratropium (ATROVENT) 0.03 % nasal spray Place 2 sprays into the nose every 12 (twelve) hours. 180 mL 4  . levothyroxine (SYNTHROID, LEVOTHROID) 75 MCG tablet Take 1 tablet (75 mcg total) by mouth daily. 90 tablet 2  . nadolol (CORGARD) 20 MG tablet Take 0.5 tablets (10 mg total) by mouth  daily. 90 tablet 1  . venlafaxine XR (EFFEXOR-XR) 75 MG 24 hr capsule Take 1 capsule (75 mg total) by mouth daily. 90 capsule 3  . acetaminophen (TYLENOL) 500 MG tablet Take 500 mg by mouth 2 (two) times daily as needed for mild pain.     Vladimir Faster Glycol-Propyl Glycol (SYSTANE) 0.4-0.3 % SOLN Apply 1 drop to eye 3 (three) times daily as needed (for dryness/itching).     Allergies-reviewed and updated Allergies  Allergen Reactions  . Dyazide [Hydrochlorothiazide W-Triamterene]     Presumption: drug induced vasculitis  . Niacin Other (See Comments)    flushing  . Penicillins Diarrhea and Nausea And Vomiting    History   Social History  . Marital Status: Widowed    Spouse Name: N/A    Number of Children: N/A  . Years of Education: N/A   Occupational History  . retired    Social History Main Topics  . Smoking status: Former Smoker -- 0.75 packs/day for 3 years    Types: Cigarettes    Quit date: 03/11/1969  . Smokeless tobacco: Never Used  . Alcohol Use: 0.0 oz/week    0 Not specified per week     Comment: occasional wine  . Drug Use: No  . Sexual Activity: No   Other Topics Concern  . None   Social History Narrative   Widowed 2003. Moved to [place] from [place] in 2004 after loss of husband to be near daughter. 1 daughter. 2 grandkids.       Retired from working at a church      Hobbies: Training and development officer    ROS--See HPI   Objective: BP 120/82 mmHg  Temp(Src) 97.8 F (36.6 C)  Wt 148 lb (67.132 kg) Gen: NAD, resting comfortably HEENT: Mucous membranes are moist. Oropharynx normal. PERRLA CV: RRR no murmurs rubs or gallops Lungs: CTAB no crackles, wheeze, rhonchi Abdomen: soft/nontender/nondistended/normal bowel sounds.  Ext: trace edema Skin: warm, dry, no rash Neuro: grossly normal, moves all extremities, PERRLA   Assessment/Plan:  GERD (gastroesophageal reflux disease) Poor control on OTC nexium 20mg . Rx provided for 40mg . Follow u pif does not improve.     Chronic kidney disease, stage III (moderate) Advised to avoid nsaids. 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 Fortunately had some return of function. Discussed ace-i or ARB but with dyazide reaction history patient obviously hesitant and we discussed considering this only if she had further progression.    Essential hypertension Controlled with Nadolol 10mg . Discussed transition to ace i or arb as per CKD section but patient not interested   Hypothyroidism Controlled on Levothyroxine 68mcg. Recheck with next visit.    Return  precautions advised. 6 month follow up with labs at time of visit.   Meds ordered this encounter  Medications  . esomeprazole (NEXIUM) 40 MG capsule    Sig: TAKE 1 CAPSULE DAILY    Dispense:  90 capsule    Refill:  3

## 2014-03-31 NOTE — Assessment & Plan Note (Signed)
Controlled with Nadolol 10mg . Discussed transition to ace i or arb as per CKD section but patient not interested

## 2014-03-31 NOTE — Assessment & Plan Note (Signed)
Advised to avoid nsaids. 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 Fortunately had some return of function. Discussed ace-i or ARB but with dyazide reaction history patient obviously hesitant and we discussed considering this only if she had further progression.

## 2014-03-31 NOTE — Patient Instructions (Signed)
Let's check back in 6 months from now.   Hopeful the nexium prescription calms things back down and obviously come see [place] if it doesn't.   We discussed pneumonia vaccine as well as medicine to protect your kidneys but with your vasculitis history we decided not to rock the boat.   I would advise regular exercise even if its walking in place at home for 15 minutes a few times a week to start.   I will look forward to reading results from ENT. Also I am hopeful for a good report from your breast biopsy.

## 2014-03-31 NOTE — Assessment & Plan Note (Signed)
Controlled on Levothyroxine 86mcg. Recheck with next visit.

## 2014-03-31 NOTE — Assessment & Plan Note (Signed)
Poor control on OTC nexium 20mg . Rx provided for 40mg . Follow u pif does not improve.

## 2014-04-07 ENCOUNTER — Ambulatory Visit
Admission: RE | Admit: 2014-04-07 | Discharge: 2014-04-07 | Disposition: A | Discharge status: Home / Self Care | Payer: Medicare Other | Source: Ambulatory Visit | Attending: Obstetrics and Gynecology | Admitting: Obstetrics and Gynecology

## 2014-04-07 DIAGNOSIS — N6320 Unspecified lump in the left breast, unspecified quadrant: Secondary | ICD-10-CM

## 2014-04-07 DIAGNOSIS — C50812 Malignant neoplasm of overlapping sites of left female breast: Secondary | ICD-10-CM | POA: Diagnosis not present

## 2014-04-07 DIAGNOSIS — N63 Unspecified lump in breast: Secondary | ICD-10-CM | POA: Diagnosis not present

## 2014-04-11 ENCOUNTER — Telehealth: Payer: Self-pay | Admitting: *Deleted

## 2014-04-11 NOTE — Telephone Encounter (Signed)
Called pt and confirmed 05/02/14 appt w/ her.  Mailed before appt letter, calendar, welcoming packet & intake form to pt.  Emailed Dr. Donne Hazel at [facility] to make him aware.

## 2014-04-15 DIAGNOSIS — C50912 Malignant neoplasm of unspecified site of left female breast: Secondary | ICD-10-CM | POA: Diagnosis not present

## 2014-04-18 ENCOUNTER — Encounter: Payer: Self-pay | Admitting: *Deleted

## 2014-04-18 NOTE — Progress Notes (Signed)
Completed chart, added to spreadsheet & placed in Dr. Virgie Dad box.

## 2014-04-20 ENCOUNTER — Other Ambulatory Visit: Payer: Self-pay | Admitting: Obstetrics and Gynecology

## 2014-04-20 DIAGNOSIS — Z124 Encounter for screening for malignant neoplasm of cervix: Secondary | ICD-10-CM | POA: Diagnosis not present

## 2014-04-20 DIAGNOSIS — Z01419 Encounter for gynecological examination (general) (routine) without abnormal findings: Secondary | ICD-10-CM | POA: Diagnosis not present

## 2014-04-20 NOTE — Progress Notes (Signed)
Location of Breast Cancer:Left breast stage I History of right breast cancer  Histology per Pathology Report: 04/07/2014 Diagnosis Breast, left, needle core biopsy, mass, 6 o'clock - INVASIVE CARCINOMA.  Receptor Status: ER 0 %(-), PR 0% (-), Her2-neu ()  Did patient present with symptoms (if so, please note symptoms) or was this found on screening mammography?:  Past/Anticipated interventions by surgeon, if any:  Past/Anticipated interventions by medical oncology, if any: Chemotherapy  Chemotherapy for right breast cancer  ER+PR+ Her 2 neg.in 1998 included AC x 4, tamoxifen was d/c'd and femara x5 years  Lymphedema issues, if any:   NO  Pain issues, if any: NO  SAFETY ISSUES:  Prior radiation? NO  Pacemaker/ICD? No  Possible current pregnancy?No post-menopausal  Is the patient on methotrexate? no  Current Complaints / other details:Menarche 48, gravida 1,child at age 61, menopause between [age].used oral contraceptives. Former smoker, 2 years  -1 cigarette to 1pp day cigarettes, /Occasional alcohol.no smokeless tobacco Modified radical mastectomy and lymph nodes November 1998, then chemotherapy 4-5 weeks later till end of January 1999 in [place], [place].   Allergies;niacin, dyazide and penicillin and antihyperlipidemics Mother had endometrial cancer. Cousins x 3 had breast cancer.    Arlyss Repress, RN 04/20/2014,10:40 AM

## 2014-04-21 ENCOUNTER — Ambulatory Visit
Admission: RE | Admit: 2014-04-21 | Discharge: 2014-04-21 | Disposition: A | Discharge status: Home / Self Care | Payer: Medicare Other | Source: Ambulatory Visit | Attending: Radiation Oncology | Admitting: Radiation Oncology

## 2014-04-21 ENCOUNTER — Telehealth: Payer: Self-pay | Admitting: Oncology

## 2014-04-21 ENCOUNTER — Other Ambulatory Visit: Payer: Self-pay | Admitting: *Deleted

## 2014-04-21 ENCOUNTER — Encounter: Payer: Self-pay | Admitting: *Deleted

## 2014-04-21 ENCOUNTER — Encounter: Payer: Self-pay | Admitting: Radiation Oncology

## 2014-04-21 VITALS — BP 122/58 | HR 66 | Temp 97.7°F | Resp 20 | Ht 66.5 in | Wt 147.7 lb

## 2014-04-21 DIAGNOSIS — Z9011 Acquired absence of right breast and nipple: Secondary | ICD-10-CM | POA: Insufficient documentation

## 2014-04-21 DIAGNOSIS — C50512 Malignant neoplasm of lower-outer quadrant of left female breast: Secondary | ICD-10-CM | POA: Diagnosis not present

## 2014-04-21 DIAGNOSIS — Z86718 Personal history of other venous thrombosis and embolism: Secondary | ICD-10-CM | POA: Insufficient documentation

## 2014-04-21 DIAGNOSIS — Z853 Personal history of malignant neoplasm of breast: Secondary | ICD-10-CM

## 2014-04-21 DIAGNOSIS — Z803 Family history of malignant neoplasm of breast: Secondary | ICD-10-CM | POA: Diagnosis not present

## 2014-04-21 DIAGNOSIS — Z9221 Personal history of antineoplastic chemotherapy: Secondary | ICD-10-CM | POA: Diagnosis not present

## 2014-04-21 DIAGNOSIS — Z808 Family history of malignant neoplasm of other organs or systems: Secondary | ICD-10-CM | POA: Insufficient documentation

## 2014-04-21 NOTE — Telephone Encounter (Signed)
per pof to sch pt appt-talkwd w/pt & gave pt time & date of appt

## 2014-04-21 NOTE — Progress Notes (Signed)
Please see the Nurse Progress Note in the MD Initial Consult Encounter for this patient. 

## 2014-04-22 ENCOUNTER — Ambulatory Visit: Payer: Medicare Other

## 2014-04-22 ENCOUNTER — Ambulatory Visit (HOSPITAL_BASED_OUTPATIENT_CLINIC_OR_DEPARTMENT_OTHER): Payer: Medicare Other | Admitting: Oncology

## 2014-04-22 ENCOUNTER — Other Ambulatory Visit (HOSPITAL_BASED_OUTPATIENT_CLINIC_OR_DEPARTMENT_OTHER): Payer: Medicare Other

## 2014-04-22 ENCOUNTER — Encounter: Payer: Self-pay | Admitting: *Deleted

## 2014-04-22 VITALS — BP 137/69 | HR 75 | Temp 97.7°F | Resp 18 | Ht 66.5 in | Wt 147.5 lb

## 2014-04-22 DIAGNOSIS — I10 Essential (primary) hypertension: Secondary | ICD-10-CM

## 2014-04-22 DIAGNOSIS — C50112 Malignant neoplasm of central portion of left female breast: Secondary | ICD-10-CM

## 2014-04-22 DIAGNOSIS — Z171 Estrogen receptor negative status [ER-]: Secondary | ICD-10-CM | POA: Diagnosis not present

## 2014-04-22 DIAGNOSIS — Z853 Personal history of malignant neoplasm of breast: Secondary | ICD-10-CM | POA: Diagnosis not present

## 2014-04-22 DIAGNOSIS — N183 Chronic kidney disease, stage 3 unspecified: Secondary | ICD-10-CM

## 2014-04-22 DIAGNOSIS — C50911 Malignant neoplasm of unspecified site of right female breast: Secondary | ICD-10-CM

## 2014-04-22 DIAGNOSIS — C50912 Malignant neoplasm of unspecified site of left female breast: Secondary | ICD-10-CM

## 2014-04-22 DIAGNOSIS — M8580 Other specified disorders of bone density and structure, unspecified site: Secondary | ICD-10-CM

## 2014-04-22 LAB — CBC WITH DIFFERENTIAL/PLATELET
BASO%: 0.3 % (ref 0.0–2.0)
Basophils Absolute: 0 10*3/uL (ref 0.0–0.1)
EOS%: 1.4 % (ref 0.0–7.0)
Eosinophils Absolute: 0.1 10*3/uL (ref 0.0–0.5)
HCT: 36.1 % (ref 34.8–46.6)
HGB: 12 g/dL (ref 11.6–15.9)
LYMPH#: 1.8 10*3/uL (ref 0.9–3.3)
LYMPH%: 31.8 % (ref 14.0–49.7)
MCH: 30.3 pg (ref 25.1–34.0)
MCHC: 33.2 g/dL (ref 31.5–36.0)
MCV: 91.2 fL (ref 79.5–101.0)
MONO#: 0.5 10*3/uL (ref 0.1–0.9)
MONO%: 9.2 % (ref 0.0–14.0)
NEUT#: 3.3 10*3/uL (ref 1.5–6.5)
NEUT%: 57.3 % (ref 38.4–76.8)
Platelets: 239 10*3/uL (ref 145–400)
RBC: 3.96 10*6/uL (ref 3.70–5.45)
RDW: 13.2 % (ref 11.2–14.5)
WBC: 5.8 10*3/uL (ref 3.9–10.3)

## 2014-04-22 LAB — COMPREHENSIVE METABOLIC PANEL (CC13)
ALK PHOS: 80 U/L (ref 40–150)
ALT: 12 U/L (ref 0–55)
AST: 20 U/L (ref 5–34)
Albumin: 4.3 g/dL (ref 3.5–5.0)
Anion Gap: 9 mEq/L (ref 3–11)
BILIRUBIN TOTAL: 0.42 mg/dL (ref 0.20–1.20)
BUN: 21.8 mg/dL (ref 7.0–26.0)
CO2: 25 meq/L (ref 22–29)
CREATININE: 1.1 mg/dL (ref 0.6–1.1)
Calcium: 9.4 mg/dL (ref 8.4–10.4)
Chloride: 105 mEq/L (ref 98–109)
EGFR: 49 mL/min/{1.73_m2} — AB (ref >90)
GLUCOSE: 107 mg/dL (ref 70–140)
Potassium: 5.1 mEq/L (ref 3.5–5.1)
Sodium: 139 mEq/L (ref 136–145)
Total Protein: 7.6 g/dL (ref 6.4–8.3)

## 2014-04-22 NOTE — Progress Notes (Signed)
Met with pt during new pt appt. Gave navigation resources, journey bag and contact information. Pt denies needs at this time. Encourage pt to call with questions or concerns. Received verbal understanding.

## 2014-04-23 NOTE — Progress Notes (Signed)
Radiation Oncology         [phone]) [phone] ________________________________  Initial Outpatient Consultation - Date: 6/44/0347   Name: Laura Li MRN: [id number]   DOB: 12-Mar-1940  REFERRING PHYSICIAN: Rolm Bookbinder, MD  DIAGNOSIS:    ICD-9-CM ICD-10-CM   1. 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 She had a 3 cm tumor and 0/25 nodes positive. This was ER/PR+. She had AC x 4 then tamoxifen and femara. She had a DVT in 2002 and the tamoxifen was discontinued at that time. A screening mammogram showed a left breast cancer which was confirmed with ultrasound to measure 1.3 x 0.7 x 0.6 cm. Ultrasound of the axilla is negative. Biopsy shows an invasive mammary carcinoma with squamous features and a triple negative prognositc panel with a Ki67 of 10%. She has seen Dr. Donne Hazel and is interested in breast conservation. She is scheduled to see Dr. Jana Hakim and Dr. Bo Merino. She is interested in breast conservation. She is accompanied by her daughter. She is GxP1 with her first child at 50. She had menarche at 66 and is post menopausal. She denies HRT use. Her mother had endometrial cancer and she has 3 cousins with breast cancer. She has done well since her biopsy.   PREVIOUS RADIATION THERAPY: No  Past medical, social and family history were reviewed in the electronic chart. Review of symptoms was reviewed in the electronic chart. Medications were reviewed in the electronic chart.   PHYSICAL EXAM:  Filed Vitals:   04/21/14 0849  BP: 122/58  Pulse: 66  Temp: 97.7 F (36.5 C)  Resp: 20  .147 lb 11.2 oz (66.996 kg). Pleasant female. Appears her stated. Age. No palpable axillary, supraclavicular or cervical adenopathy. S/p radical mastectomy on the right. Ribs palpable below the  skin with no evidence of recurrence. No palpable abnormalities of the left breast. Bruising associated with the biopsy site in the left breast.   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 We discussed the equivalence in terms of survival and local failure between mastectomy and breast conservation.  We discussed the role of radiation and decreasing local failures in patients who undergo lumpectomy.We discussed the possible side effects including but not limited to asymptomatic rib, heart and lung damage, heart disease, skin redness, fatigue, permanent skin darkening, and chest wall swelling. We discussed the process of simulation and the placement tattoos. We discussed the low likelihood of secondary malignancies.   We discussed the results of randomized trials looking at hypofractionated radiation which she would certainly be a candidate for (16 fractions with or without a boost depending on her margins). She is triple negative so she is not a candidate for antiestrogen treatment alone.   Genetics is pending and she may elect for mastectomy pending those results (although at her age, I'm not sure the phenotype would automatically suggest another mastectomy). She also has a meeting with Dr. Jana Hakim who may recommend chemotherapy given her triple negative status.  We discussed that chemotherapy would be given before radiation if that is what they decide to do.   I will plan on seeing her back after surgery. She was able to meet with one of our breast cancer  navigators after our consultation.   I spent 40 minutes face to face with the patient and more than 50% of that time was spent in counseling and/or coordination of care.   ------------------------------------------------  Thea Silversmith, MD

## 2014-04-24 DIAGNOSIS — Z853 Personal history of malignant neoplasm of breast: Secondary | ICD-10-CM | POA: Insufficient documentation

## 2014-04-24 NOTE — Progress Notes (Signed)
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 Non-of the 25 lymph nodes removed from the right axilla were involved. The tumor was estrogen and progesterone receptor positive, HER-2 not amplified. She was treated adjuvantly with doxorubicin and cyclophosphamide 4, then received 5 years of antiestrogen therapy (initially tamoxifen, later letrozole). There has been no evidence of disease recurrence on the right.  More recently, Laura Li had routine left screening mammography [facility] March 08 2014. This showed a suspicious area of architectural distortion in the inferior portion of the left breast. On 93/81/0175 Laura Li underwent left diagnostic mammography with ultrasonography. Spot compression views confirmed the presence 7 area of distortion in the lower central left breast, which was not palpable by physical exam (there was a prior biopsy scar in the upper outer quadrant of the left breast). Ultrasound confirmed a hypoechoic spiculated mass at the 6:00 location in the left breast, measuring maximally 1.3 cm. Sonography of the left axilla was negative.  Biopsy of the left breast mass in question 120 10/29/2014 showed ([id number]) an invasive breast cancer with squamous features. It was estrogen and progesterone receptor negative. There was no HER-2 amplification, the signals ratio being 0.82 and the number per cell 1.80. The MIB-1 was 10%.  The patient  has met with Dr. Donne Hazel in surgery and Dr. Pablo Ledger in radiation oncology. The patient understands there is no survival difference between lumpectomy and radiation compared with mastectomy. The patient will need sentinel lymph node sampling with either of those procedures. If she does have a lumpectomy, she will benefit from adjuvant radiation. The patient has been set up for genetics testing and this may affect her ultimate surgical decision.  Her subsequent history is as detailed below  INTERVAL HISTORY: Laura Li was evaluated in the breast clinic 04/22/2014 accompanied by her daughter Laura Li.  REVIEW OF SYSTEMS: There were no specific symptoms leading to the original mammogram, which was routinely scheduled. The patient denies unusual headaches, visual changes, nausea, vomiting, stiff neck, dizziness, or gait imbalance. There has been no cough, phlegm production, or pleurisy, no chest pain or pressure, and no change in bowel or bladder habits. The patient denies fever, rash, bleeding, unexplained fatigue or unexplained weight loss. Laura Li does have some cataract issues and she is awaiting surgery to deal with that. She has some joint and back pain which is not more persistent or intense than usual. She had a urinary tract infection approximately 6 weeks ago. A detailed review of systems was otherwise entirely negative.  PAST MEDICAL HISTORY: Past Medical History  Diagnosis Date  . OA (osteoarthritis)   . History of DVT (deep vein thrombosis)   . Depression   . Hyperlipidemia   . Hypertension   . Hypothyroidism   . HX: breast cancer     melanoma  . Melanoma   . Eczema   . Arthritis   . Esophageal stricture   . GERD (gastroesophageal reflux disease)     PAST SURGICAL HISTORY: Past Surgical History  Procedure Laterality  Date  . Appendectomy  1967  . Mastectomy  1998    right  . Melanoma excision  2004    right side of face  . 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 Total  abdominal hysterectomy  1992    including ovaries  . Tonsillectomy and adenoidectomy  1949  . Breast lumpectomy      left breast-benign  . Breast surgery  1977    removal of calcified milk gland right breast  . Lipoma excision  1978    right breast    FAMILY HISTORY Family History  Problem Relation Age of Onset  . Heart disease Father 75    smoker  . Endometrial cancer Mother   . Breast cancer Cousin     x 3  . Lung cancer Paternal Aunt   . Brain cancer Maternal Aunt   . Colon cancer Neg Hx   . Diabetes Maternal Aunt     ???  The patient's father died at the age of 14 from a myocardial infarction. The patient's mother died at the age of 55 from metastatic endometrial cancer which had been diagnosed 2 years before. The patient had no brothers, one sister. The patient's mother's sister was diagnosed with breast cancer but the patient does not know at what age. A second maternal aunt was diagnosed with brain cancer. The patient has 3 cousins on her mother's side diagnosed with breast cancer, one of them under the age of 78. On the father's side there is a history of lung and cervical cancer  GYNECOLOGIC HISTORY:  No LMP recorded. Patient has had a hysterectomy. Menarche age 63, first live birth age 37. The patient is GX P1. She underwent hysterectomy with bilateral salpingo-oophorectomy in 1991. She used hormone replacement for approximately 7 years until 1998, when she had her right-sided breast cancer. She used oral contraceptives for approximately one year with no complications. 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 She is widowed and lives alone with no pets. Her main hobby is painting. Her daughter Laura Li lives in [place] and is also a homemaker. The patient has 2 grandchildren. She attends wholly  [place]: [employer]. The patient's daughter Laura Li is her healthcare power of attorney. Laura Li can be reached at [phone]   HEALTH MAINTENANCE: History  Substance Use Topics  . Smoking status: Former Smoker -- 0.75 packs/day for 3 years    Types: Cigarettes    Quit date: 03/11/1969  . Smokeless tobacco: Never Used  . Alcohol Use: 0.0 oz/week    0 Standard drinks or equivalent per week     Comment: occasional wine     Colonoscopy: 20/10/[facility] Pastor  NTZ:GYFVCB post hysterectomy  Bone density:2014/osteopenia  Lipid panel:  Allergies  Allergen Reactions  . Dyazide [Hydrochlorothiazide W-Triamterene]     Presumption: drug induced vasculitis  . Niacin Other (See Comments)    flushing  . Penicillins Diarrhea and Nausea And Vomiting    Current Outpatient Prescriptions  Medication Sig Dispense Refill  . acetaminophen (TYLENOL) 500 MG tablet Take 500 mg by mouth 2 (two) times daily as needed for mild pain.     [name] atorvastatin (LIPITOR) 20 MG tablet Take 1 tablet (20 mg total) by mouth daily. (Patient taking differently: 10 mg daily. ) 90  tablet 3  . B Complex-C-Folic Acid (STRESS FORMULA PO) Take 1 tablet by mouth daily.      . Calcium-Vitamin D (CALTRATE 600 PLUS-VIT D PO) Take 1 tablet by mouth 2 (two) times daily.     [name] esomeprazole (NEXIUM) 40 MG capsule TAKE 1 CAPSULE DAILY 90 capsule 3  . fexofenadine (ALLEGRA) 180 MG tablet Take 180 mg by mouth daily.    [name] ipratropium (ATROVENT) 0.03 % nasal spray Place 2 sprays into the nose every 12 (twelve) hours. 180 mL 4  . levothyroxine (SYNTHROID, LEVOTHROID) 75 MCG tablet Take 1 tablet (75 mcg total) by mouth daily. 90 tablet 2  . nadolol (CORGARD) 20 MG tablet Take 0.5 tablets (10 mg total) by mouth daily. 90 tablet 1  . Polyethyl Glycol-Propyl Glycol (SYSTANE) 0.4-0.3 % SOLN Apply 1 drop to eye 3 (three) times daily as needed (for dryness/itching).    . venlafaxine XR (EFFEXOR-XR) 75 MG 24 hr capsule Take 1 capsule  (75 mg total) by mouth daily. 90 capsule 3   No current facility-administered medications for this visit.    OBJECTIVE: Older white woman in no acute distress  Filed Vitals:   04/22/14 1609  BP: 137/69  Pulse: 75  Temp: 97.7 F (36.5 C)  Resp: 18     Body mass index is 23.45 kg/(m^2).    ECOG FS:1 - Symptomatic but completely ambulatory  Ocular: Sclerae unicteric, pupilsround and equal  Ear-nose-throat: Oropharynx clear and moist  Lymphatic: No cervical or supraclavicular adenopathy Lungs no rales or rhonchi, good excursion bilaterally Heart regular rate and rhythm, no murmur appreciated Abd soft, nontender, positive bowel sounds MSK no focal spinal tenderness, no joint edema Neuro: non-focal, well-oriented,positive  affect Breasts: The right breast is status post remote mastectomy. There is no evidence of chest wall recurrence. The right axilla is benign. The left breast is status post recent biopsy. I do not palpate a mass in the area in question(inferior aspect of the breast). There are no skin or nipple changes of concern. The left axilla is benign.   LAB RESULTS:  CMP     Component Value Date/Time   NA 139 04/22/2014 1555   NA 137 12/21/2013 1004   K 5.1 04/22/2014 1555   K 4.4 12/21/2013 1004   CL 101 12/21/2013 1004   CO2 25 04/22/2014 1555   CO2 29 12/21/2013 1004   GLUCOSE 107 04/22/2014 1555   GLUCOSE 83 12/21/2013 1004   GLUCOSE 88 02/06/2006 1059   BUN 21.8 04/22/2014 1555   BUN 22 12/21/2013 1004   CREATININE 1.1 04/22/2014 1555   CREATININE 1.2 12/21/2013 1004   CALCIUM 9.4 04/22/2014 1555   CALCIUM 10.6* 12/21/2013 1004   PROT 7.6 04/22/2014 1555   PROT 8.5* 12/21/2013 1004   ALBUMIN 4.3 04/22/2014 1555   ALBUMIN 4.2 12/21/2013 1004   AST 20 04/22/2014 1555   AST 24 12/21/2013 1004   ALT 12 04/22/2014 1555   ALT 19 12/21/2013 1004   ALKPHOS 80 04/22/2014 1555   ALKPHOS 64 12/21/2013 1004   BILITOT 0.42 04/22/2014 1555   BILITOT 0.8 12/21/2013  1004   GFRNONAA 74.11 09/18/2009 1016   GFRAA 91 12/10/2007 1118    INo results found for: SPEP, UPEP  Lab Results  Component Value Date   WBC 5.8 04/22/2014   NEUTROABS 3.3 04/22/2014   HGB 12.0 04/22/2014   HCT 36.1 04/22/2014   MCV 91.2 04/22/2014   PLT 239 04/22/2014      Chemistry  Component Value Date/Time   NA 139 04/22/2014 1555   NA 137 12/21/2013 1004   K 5.1 04/22/2014 1555   K 4.4 12/21/2013 1004   CL 101 12/21/2013 1004   CO2 25 04/22/2014 1555   CO2 29 12/21/2013 1004   BUN 21.8 04/22/2014 1555   BUN 22 12/21/2013 1004   CREATININE 1.1 04/22/2014 1555   CREATININE 1.2 12/21/2013 1004      Component Value Date/Time   CALCIUM 9.4 04/22/2014 1555   CALCIUM 10.6* 12/21/2013 1004   ALKPHOS 80 04/22/2014 1555   ALKPHOS 64 12/21/2013 1004   AST 20 04/22/2014 1555   AST 24 12/21/2013 1004   ALT 12 04/22/2014 1555   ALT 19 12/21/2013 1004   BILITOT 0.42 04/22/2014 1555   BILITOT 0.8 12/21/2013 1004       No results found for: LABCA2  No components found for: LABCA125  No results for input(s): INR in the last 168 hours.  Urinalysis    Component Value Date/Time   COLORURINE YELLOW 05/17/2013 1330   APPEARANCEUR CLEAR 05/17/2013 1330   LABSPEC 1.025 05/17/2013 1330   PHURINE 5.5 05/17/2013 1330   GLUCOSEU NEGATIVE 05/17/2013 1330   HGBUR NEGATIVE 05/17/2013 1330   BILIRUBINUR n 06/21/2013 1115   BILIRUBINUR NEGATIVE 05/17/2013 1330   KETONESUR NEGATIVE 05/17/2013 1330   PROTEINUR n 06/21/2013 1115   PROTEINUR NEGATIVE 05/17/2013 1330   UROBILINOGEN 0.2 06/21/2013 1115   UROBILINOGEN 0.2 05/17/2013 1330   NITRITE n 06/21/2013 1115   NITRITE NEGATIVE 05/17/2013 1330   LEUKOCYTESUR Trace 06/21/2013 1115    STUDIES: Mm Digital Diagnostic Unilat L  04/07/2014   CLINICAL DATA:  Status post ultrasound-guided core needle biopsy of the left breast.  EXAM: DIAGNOSTIC LEFT MAMMOGRAM POST ULTRASOUND BIOPSY  COMPARISON:  Previous exams  FINDINGS:  Mammographic images were obtained following ultrasound guided biopsy of a left breast mass. This demonstrates satisfactory positioning of the ribbon metal tissue marker focal post biopsy change.  IMPRESSION: Satisfactory placement of the metal tissue marker following ultrasound-guided core needle biopsy. Pathology is pending.  Final Assessment: Post Procedure Mammograms for Marker Placement   Electronically Signed   By: Andres Shad   On: 04/07/2014 15:16   Mm Digital Diagnostic Unilat L  03/29/2014   CLINICAL DATA:  History of right mastectomy. Previous benign excisional biopsy in the upper-outer quadrant of the left breast. Patient returns after screening study for evaluation of possible left breast distortion.  EXAM: DIGITAL DIAGNOSTIC  LEFT MAMMOGRAM WITH CAD  ULTRASOUND LEFT BREAST  COMPARISON:  02/25/2013 and earlier  ACR Breast Density Category c: The breast tissue is heterogeneously dense, which may obscure small masses.  FINDINGS: Spot compression views confirm presence of distortion in the lower central portion of the left breast.  Mammographic images were processed with CAD.  On physical exam, there is a well-healed biopsy scar in the upper-outer quadrant of the left breast, at least 6 cm from the nipple. I palpate no abnormality in the 6 o'clock location of the left breast.  Ultrasound is performed, showing a hypoechoic spiculated mass in the 6 o'clock location of the left breast 1 cm from the nipple. Mass is taller than wide and measures 0.6 x 0.7 x 1.3 cm. Evaluation of the left axilla is negative for adenopathy.  IMPRESSION: Suspicious mass in the 6 o'clock location of the left breast.  RECOMMENDATION: Ultrasound-guided core biopsy is recommended and scheduled for the patient on 04/07/2014 at 2 o'clock p.m.  I have discussed  the findings and recommendations with the patient. Results were also provided in writing at the conclusion of the visit. If applicable, a reminder letter will be sent to the  patient regarding the next appointment.  BI-RADS CATEGORY  4: Suspicious.   Electronically Signed   By: Shon Hale M.D.   On: 03/29/2014 11:24   US Breast Ltd Uni Left Inc Axilla  03/29/2014   CLINICAL DATA:  History of right mastectomy. Previous benign excisional biopsy in the upper-outer quadrant of the left breast. Patient returns after screening study for evaluation of possible left breast distortion.  EXAM: DIGITAL DIAGNOSTIC  LEFT MAMMOGRAM WITH CAD  ULTRASOUND LEFT BREAST  COMPARISON:  02/25/2013 and earlier  ACR Breast Density Category c: The breast tissue is heterogeneously dense, which may obscure small masses.  FINDINGS: Spot compression views confirm presence of distortion in the lower central portion of the left breast.  Mammographic images were processed with CAD.  On physical exam, there is a well-healed biopsy scar in the upper-outer quadrant of the left breast, at least 6 cm from the nipple. I palpate no abnormality in the 6 o'clock location of the left breast.  Ultrasound is performed, showing a hypoechoic spiculated mass in the 6 o'clock location of the left breast 1 cm from the nipple. Mass is taller than wide and measures 0.6 x 0.7 x 1.3 cm. Evaluation of the left axilla is negative for adenopathy.  IMPRESSION: Suspicious mass in the 6 o'clock location of the left breast.  RECOMMENDATION: Ultrasound-guided core biopsy is recommended and scheduled for the patient on 04/07/2014 at 2 o'clock p.m.  I have discussed the findings and recommendations with the patient. Results were also provided in writing at the conclusion of the visit. If applicable, a reminder letter will be sent to the patient regarding the next appointment.  BI-RADS CATEGORY  4: Suspicious.   Electronically Signed   By: Shon Hale M.D.   On: 03/29/2014 11:24   [place] Lt Breast Bx W Loc Dev 1st Lesion Img Bx Spec US Guide  04/08/2014   ADDENDUM REPORT: 04/08/2014 13:44  ADDENDUM: Results indicate invasive carcinoma, likely of  breast origin, consistent with imaging findings. I discussed these results with the patient at 13:44 on 04/08/14, and let her know we will call her again once an appointment time is established for surgical referral. She indicated no complaints or complications related to the biopsy.   Electronically Signed   By: Skipper Cliche M.D.   On: 04/08/2014 13:44   04/08/2014   CLINICAL DATA:  Patient is a 74 year old female who presents for ultrasound-guided core needle biopsy of the left breast.  EXAM: ULTRASOUND GUIDED LEFT BREAST CORE NEEDLE BIOPSY WITH VACUUM ASSIST  COMPARISON:  Previous exams.  PROCEDURE: I met with the patient and we discussed the procedure of ultrasound-guided biopsy, including benefits and alternatives. We discussed the high likelihood of a successful procedure. We discussed the risks of the procedure including infection, bleeding, tissue injury, clip migration, and inadequate sampling. Informed written consent was given. The usual time-out protocol was performed immediately prior to the procedure.  Using sterile technique and 2% Lidocaine as local anesthetic, under direct ultrasound visualization, a 12 gauge mechanical device was used to perform biopsy of hypoechoic spiculated mass at 6 o'clock using a medial to lateral approach. At the conclusion of the procedure, a ribbon shaped tissue marker clip was deployed into the biopsy cavity. Follow-up 2-view mammogram was performed and dictated separately.  IMPRESSION: Ultrasound-guided biopsy of  a left breast mass. No apparent complications. Pathology is pending.  Electronically Signed: By: Andres Shad On: 04/07/2014 15:15    ASSESSMENT: 74 y.o. [place] woman  (1) status post left modified radical mastectomy November 1998 for a pT2 pN0, stage IIA invasive ductal carcinoma, grade 2, estrogen and progesterone receptor positive, HER-2 not amplified  (a) adjuvant chemotherapy consisted of doxorubicin and cyclophosphamide 4  (b) adjuvant  anti-estrogens consisted of tamoxifen, then letrozole, for a total of 5 years  (2) status post left breast lower outer quadrant biopsy 04/07/2014 for a clinical T1c N0,stage IA  invasive ductal carcinoma, with squamous features, estrogen and progesterone receptor negative, HER-2 not amplified, with an MIB-1 of 10%   (3) genetics testing pending  PLAN: We spent the better part of today's hour-long appointment discussing the biology of breast cancer in general, and the specifics of the patient's tumor in particular. Marlis understands that according to NCCN guidelines patient's like her should receive chemotherapy. That certainly would not be unreasonable.  Qualifying that recommendation however are several factors. She is over 70, and NCCN guidelines include a disclaimer that the data for patients over 70 is more sparse, and therefore the recommendations have less strength. Second, the patient has had prior chemotherapy for her earlier breast cancer. That limits the chemotherapy choices. Thirdly, even though this cancer is triple negative, it has a very low proliferation fraction. In general, slow growing tumors do not respond as well to chemotherapy. Finally, this breast cancer has adenosquamous features. The data for this particular subset of breast cancers is even more restricted.  To summarize, while chemotherapy would be very reasonable in this case, it is certainly not an automatic decision and the benefit of chemotherapy in terms of risk reduction is likely to be marginal.  For this reasons after our discussion today we have decided to request a Mammaprint test from the definitive surgical procedure, which is upcoming. If the Mammaprint comes in "low risk", we will only observe the patient. If it comes in "high risk", that we will give [place] more information regarding the patient's potential benefit from chemotherapy and she will be able to make a clear decision.  Because the benefits of systemic  therapy in this case are questionable (in fact whether the patient will receive any systemic therapy has not yet been decided) I would favor optimization of local treatment, including sentinel lymph node sampling and if the patient has a lumpectomy, adjuvant radiation.  Azarah has a good understanding of the overall plan. She agrees with it. She knows the goal of treatment in her case is cure. She will call with any problems that may develop before her next visit here, which will be approximately 2 weeks after her definitive surgery.   The patient has a good understanding of the overall plan. She agrees with it. She knows the goal of treatment in her case is cure. She will call with any problems that may develop before her next visit here.  Chauncey Cruel, MD   04/24/2014 9:04 AM Medical Oncology and Hematology [facility] [address] [place], [place] [address] Tel. [phone]    Fax. [phone]

## 2014-04-25 ENCOUNTER — Telehealth: Payer: Self-pay | Admitting: Oncology

## 2014-04-25 NOTE — Telephone Encounter (Signed)
, °

## 2014-04-26 ENCOUNTER — Other Ambulatory Visit (INDEPENDENT_AMBULATORY_CARE_PROVIDER_SITE_OTHER): Payer: Self-pay | Admitting: General Surgery

## 2014-04-26 DIAGNOSIS — C50911 Malignant neoplasm of unspecified site of right female breast: Secondary | ICD-10-CM

## 2014-04-26 DIAGNOSIS — C50912 Malignant neoplasm of unspecified site of left female breast: Secondary | ICD-10-CM | POA: Diagnosis not present

## 2014-04-26 LAB — CYTOLOGY - PAP

## 2014-04-28 ENCOUNTER — Other Ambulatory Visit: Payer: Medicare Other

## 2014-04-28 ENCOUNTER — Encounter (HOSPITAL_BASED_OUTPATIENT_CLINIC_OR_DEPARTMENT_OTHER): Payer: Self-pay | Admitting: *Deleted

## 2014-04-28 ENCOUNTER — Ambulatory Visit (HOSPITAL_BASED_OUTPATIENT_CLINIC_OR_DEPARTMENT_OTHER): Payer: Medicare Other | Admitting: Genetic Counselor

## 2014-04-28 DIAGNOSIS — C50812 Malignant neoplasm of overlapping sites of left female breast: Secondary | ICD-10-CM

## 2014-04-28 DIAGNOSIS — C50911 Malignant neoplasm of unspecified site of right female breast: Secondary | ICD-10-CM

## 2014-04-28 DIAGNOSIS — D0339 Melanoma in situ of other parts of face: Secondary | ICD-10-CM | POA: Diagnosis not present

## 2014-04-28 DIAGNOSIS — Z800 Family history of malignant neoplasm of digestive organs: Secondary | ICD-10-CM | POA: Diagnosis not present

## 2014-04-28 DIAGNOSIS — Z315 Encounter for genetic counseling: Secondary | ICD-10-CM

## 2014-04-28 DIAGNOSIS — Z803 Family history of malignant neoplasm of breast: Secondary | ICD-10-CM

## 2014-04-28 DIAGNOSIS — Z8049 Family history of malignant neoplasm of other genital organs: Secondary | ICD-10-CM | POA: Diagnosis not present

## 2014-04-28 DIAGNOSIS — C50919 Malignant neoplasm of unspecified site of unspecified female breast: Secondary | ICD-10-CM | POA: Diagnosis not present

## 2014-04-28 DIAGNOSIS — Z853 Personal history of malignant neoplasm of breast: Secondary | ICD-10-CM

## 2014-04-28 DIAGNOSIS — C50912 Malignant neoplasm of unspecified site of left female breast: Principal | ICD-10-CM

## 2014-04-28 NOTE — Progress Notes (Signed)
Pt had cbc cmet-04/22/14-will come for ekg after seeds 2/24-to bring all meds overnight bag-states she is staying overnight

## 2014-04-29 ENCOUNTER — Encounter: Payer: Self-pay | Admitting: Genetic Counselor

## 2014-04-29 DIAGNOSIS — C50912 Malignant neoplasm of unspecified site of left female breast: Principal | ICD-10-CM

## 2014-04-29 DIAGNOSIS — Z803 Family history of malignant neoplasm of breast: Secondary | ICD-10-CM | POA: Insufficient documentation

## 2014-04-29 DIAGNOSIS — C50911 Malignant neoplasm of unspecified site of right female breast: Secondary | ICD-10-CM | POA: Insufficient documentation

## 2014-04-29 NOTE — Progress Notes (Signed)
[facility] Patient Visit  REFERRING PROVIDER: Marin Olp, MD [place], [place] [address]  PRIMARY PROVIDER:  [name], MD  PRIMARY REASON FOR VISIT:  1. Bilateral breast cancer   2. Family history of breast cancer     HISTORY OF PRESENT ILLNESS:   Laura Li, a 74 y.o. female, was seen for a [facility] cancer genetics consultation at the request of Dr. Yong Channel due to a personal and family history of cancer.  [name] presents to clinic today to discuss the possibility of a hereditary predisposition to cancer, genetic testing, and to further clarify her future cancer risks, as well as potential cancer risks for family members.   CANCER HISTORY:  Ms. Bluett is status post right modified radical mastectomy in November 1998 for a 3 cm invasive ductal carcinoma, grade 2. Non-of the 25 lymph nodes removed from the right axilla were involved. The tumor was estrogen and progesterone receptor positive, HER-2 not amplified. She was treated adjuvantly with doxorubicin and cyclophosphamide 4, then received 5 years of antiestrogen therapy (initially tamoxifen, later letrozole). There has been no evidence of disease recurrence on the right.  More recently, [name] had routine left screening [facility]. This showed a suspicious area of architectural distortion in the inferior portion of the left breast. On 03/29/2014 she underwent left diagnostic mammography with ultrasonography. Spot compression views confirmed the presence 7 area of distortion in the lower central left breast, which was not palpable by physical exam (there was a prior biopsy scar in the upper outer quadrant of the left breast). Ultrasound confirmed a hypoechoic spiculated mass at the 6:00 location in the left breast, measuring maximally 1.3 cm. Sonography of the left axilla was negative.  Biopsy of the  left breast mass in question 120 10/29/2014 showed ([id number]) an invasive breast cancer with squamous features. It was estrogen and progesterone receptor negative. There was no HER-2 amplification, the signals ratio being 0.82 and the number per cell 1.80. The MIB-1 was 10%.  Past Medical History  Diagnosis Date   OA (osteoarthritis)    History of DVT (deep vein thrombosis)    Depression    Hyperlipidemia    Hypertension    Hypothyroidism    HX: breast cancer     melanoma   Melanoma    Eczema    Arthritis    Esophageal stricture    GERD (gastroesophageal reflux disease)    PONV (postoperative nausea and vomiting)    Wears glasses     Past Surgical History  Procedure Laterality Date   Appendectomy  1967   Melanoma excision  2004    right side of face   Mohs surgery  2005    right left-basal cell   Total abdominal hysterectomy  1992    including ovaries   Tonsillectomy and adenoidectomy  1949   Lipoma excision  1978    right breast   Mastectomy  1998    right-nodes out   Breast lumpectomy      left breast-benign   Breast surgery  1977    removal of calcified milk gland right breast   Colonoscopy      History   Social History   Marital Status: Widowed    Spouse Name: N/A   Number of Children: N/A   Years of Education: N/A   Occupational History   retired    Science writer  History Main Topics   Smoking status: Former Smoker -- 0.75 packs/day for 3 years    Types: Cigarettes    Quit date: 03/11/1969   Smokeless tobacco: Never Used   Alcohol Use: 0.0 oz/week    0 Standard drinks or equivalent per week     Comment: occasional wine   Drug Use: No   Sexual Activity: No   Other Topics Concern   Not on file   Social History Narrative   Widowed 2003. Moved to [place] from [place] in 2004 after loss of husband to be near daughter. 1 daughter. 2 grandkids.       Retired from working at a church      Hobbies: Training and development officer     FAMILY  HISTORY:  During the visit, a 4-generation pedigree was obtained. A copy of the pedigree with be scanned into Epic under the Media tab. Significant family history diagnoses include the following: Family History  Problem Relation Age of Onset   Heart disease Father 81    smoker   Endometrial cancer Mother 51   Breast cancer Cousin     x 3 cousins with breast cancer (one at 79, one at 86 and one at 74)   Lung cancer Paternal Aunt     smoker   Brain cancer Maternal Aunt    Colon cancer Neg Hx    Diabetes Maternal Aunt     ???   Breast cancer Maternal Aunt 23   Ms. Vernet's ancestry is of Caucasian descent. There is no known Jewish ancestry or consanguinity.  GENETIC COUNSELING ASSESSMENT:  [name] is a 74 y.o. female with a personal and family history of cancer suggestive of a hereditary predisposition to cancer. We, therefore, discussed and recommended the following at today's visit.   DISCUSSION:  We reviewed the characteristics, features and inheritance patterns of hereditary cancer syndromes. We also discussed genetic testing, including the appropriate family members to test, the process of testing, insurance coverage and turn-around-time for results. We discussed the implications of a negative, positive and/or variant of uncertain significant result. We recommended [name] pursue genetic testing for the OvaNext gene panel. The OvaNext gene panel offered by [employer] includes sequencing and rearrangement analysis for the following 24 genes:ATM, BARD1, BRCA1, BRCA2, BRIP1, CDH1, CHEK2, EPCAM, MLH1, MRE11A, MSH2, MSH6, MUTYH, NBN, NF1, PALB2, PMS2, PTEN, RAD50, RAD51C, RAD51D, SMARCA4, STK11, and TP53.  PLAN:  Based on our above recommendation, [name] wished to pursue genetic testing and the blood sample was drawn and will be sent to [facility] for analysis. Results should be available within approximately 6 weeks time, at which point they will be  disclosed by telephone to [name], as will any additional recommendations warranted by these results. Lastly, we encouraged [name] to remain in contact with cancer genetics annually so that we can continuously update the family history and inform her of any changes in cancer genetics and testing that may be of benefit for this family.   Ms.  Ohlinger questions were answered to her satisfaction today. Our contact information was provided should additional questions or concerns arise. Thank you for the referral and allowing [place] to share in the care of your patient.   Catherine A. Fine, MS, CGC Certified Psychologist, sport and [email or web address] phone: [phone]  The patient was seen for a total of 45 minutes in face-to-face genetic counseling.  This patient was discussed with Dr. Jana Hakim who agrees with the above.    ______________________________________________________________________ For Office Staff:  Number of people involved in session including genetic counselor: 2 Was an intern or student involved with case: not applicable

## 2014-05-02 ENCOUNTER — Other Ambulatory Visit: Payer: Medicare Other

## 2014-05-02 ENCOUNTER — Ambulatory Visit: Payer: Medicare Other

## 2014-05-02 ENCOUNTER — Telehealth: Payer: Self-pay | Admitting: Oncology

## 2014-05-02 ENCOUNTER — Ambulatory Visit: Payer: Medicare Other | Admitting: Oncology

## 2014-05-02 NOTE — Telephone Encounter (Signed)
pt cld to get next appt time & date

## 2014-05-04 ENCOUNTER — Other Ambulatory Visit: Payer: Self-pay

## 2014-05-04 ENCOUNTER — Encounter (HOSPITAL_BASED_OUTPATIENT_CLINIC_OR_DEPARTMENT_OTHER)
Admission: RE | Admit: 2014-05-04 | Discharge: 2014-05-04 | Disposition: A | Discharge status: Home / Self Care | Payer: Medicare Other | Source: Ambulatory Visit

## 2014-05-04 ENCOUNTER — Ambulatory Visit
Admission: RE | Admit: 2014-05-04 | Discharge: 2014-05-04 | Disposition: A | Discharge status: Home / Self Care | Payer: Medicare Other | Source: Ambulatory Visit | Attending: General Surgery | Admitting: General Surgery

## 2014-05-04 DIAGNOSIS — F329 Major depressive disorder, single episode, unspecified: Secondary | ICD-10-CM | POA: Diagnosis not present

## 2014-05-04 DIAGNOSIS — D242 Benign neoplasm of left breast: Secondary | ICD-10-CM | POA: Diagnosis not present

## 2014-05-04 DIAGNOSIS — Z803 Family history of malignant neoplasm of breast: Secondary | ICD-10-CM | POA: Diagnosis not present

## 2014-05-04 DIAGNOSIS — C50912 Malignant neoplasm of unspecified site of left female breast: Secondary | ICD-10-CM

## 2014-05-04 DIAGNOSIS — K219 Gastro-esophageal reflux disease without esophagitis: Secondary | ICD-10-CM | POA: Diagnosis not present

## 2014-05-04 DIAGNOSIS — Z87891 Personal history of nicotine dependence: Secondary | ICD-10-CM | POA: Diagnosis not present

## 2014-05-04 DIAGNOSIS — R921 Mammographic calcification found on diagnostic imaging of breast: Secondary | ICD-10-CM | POA: Diagnosis not present

## 2014-05-04 DIAGNOSIS — E039 Hypothyroidism, unspecified: Secondary | ICD-10-CM | POA: Diagnosis not present

## 2014-05-04 DIAGNOSIS — Z171 Estrogen receptor negative status [ER-]: Secondary | ICD-10-CM | POA: Diagnosis not present

## 2014-05-04 DIAGNOSIS — E780 Pure hypercholesterolemia: Secondary | ICD-10-CM | POA: Diagnosis not present

## 2014-05-04 DIAGNOSIS — I10 Essential (primary) hypertension: Secondary | ICD-10-CM | POA: Diagnosis not present

## 2014-05-04 DIAGNOSIS — Z85828 Personal history of other malignant neoplasm of skin: Secondary | ICD-10-CM | POA: Diagnosis not present

## 2014-05-04 DIAGNOSIS — F419 Anxiety disorder, unspecified: Secondary | ICD-10-CM | POA: Diagnosis not present

## 2014-05-04 DIAGNOSIS — Z79899 Other long term (current) drug therapy: Secondary | ICD-10-CM | POA: Diagnosis not present

## 2014-05-04 DIAGNOSIS — M1990 Unspecified osteoarthritis, unspecified site: Secondary | ICD-10-CM | POA: Diagnosis not present

## 2014-05-04 DIAGNOSIS — N6012 Diffuse cystic mastopathy of left breast: Secondary | ICD-10-CM | POA: Diagnosis not present

## 2014-05-04 DIAGNOSIS — Z853 Personal history of malignant neoplasm of breast: Secondary | ICD-10-CM | POA: Diagnosis not present

## 2014-05-04 DIAGNOSIS — Z9011 Acquired absence of right breast and nipple: Secondary | ICD-10-CM | POA: Diagnosis not present

## 2014-05-05 ENCOUNTER — Encounter (HOSPITAL_BASED_OUTPATIENT_CLINIC_OR_DEPARTMENT_OTHER): Payer: Self-pay | Admitting: Anesthesiology

## 2014-05-05 ENCOUNTER — Ambulatory Visit (HOSPITAL_BASED_OUTPATIENT_CLINIC_OR_DEPARTMENT_OTHER): Payer: Medicare Other | Admitting: Anesthesiology

## 2014-05-05 ENCOUNTER — Encounter (HOSPITAL_COMMUNITY)
Admission: RE | Admit: 2014-05-05 | Discharge: 2014-05-05 | Disposition: A | Discharge status: Home / Self Care | Payer: Medicare Other | Source: Ambulatory Visit | Attending: General Surgery | Admitting: General Surgery

## 2014-05-05 ENCOUNTER — Ambulatory Visit
Admission: RE | Admit: 2014-05-05 | Discharge: 2014-05-05 | Disposition: A | Discharge status: Home / Self Care | Payer: Medicare Other | Source: Ambulatory Visit | Attending: General Surgery | Admitting: General Surgery

## 2014-05-05 ENCOUNTER — Ambulatory Visit (HOSPITAL_BASED_OUTPATIENT_CLINIC_OR_DEPARTMENT_OTHER)
Admission: RE | Admit: 2014-05-05 | Discharge: 2014-05-06 | Disposition: A | Discharge status: Home / Self Care | Payer: Medicare Other | Source: Ambulatory Visit | Attending: General Surgery | Admitting: General Surgery

## 2014-05-05 ENCOUNTER — Encounter (HOSPITAL_BASED_OUTPATIENT_CLINIC_OR_DEPARTMENT_OTHER)
Admission: RE | Disposition: A | Discharge status: Home / Self Care | Payer: Self-pay | Source: Ambulatory Visit | Attending: General Surgery

## 2014-05-05 DIAGNOSIS — I10 Essential (primary) hypertension: Secondary | ICD-10-CM | POA: Insufficient documentation

## 2014-05-05 DIAGNOSIS — Z79899 Other long term (current) drug therapy: Secondary | ICD-10-CM | POA: Insufficient documentation

## 2014-05-05 DIAGNOSIS — D242 Benign neoplasm of left breast: Secondary | ICD-10-CM | POA: Diagnosis not present

## 2014-05-05 DIAGNOSIS — R079 Chest pain, unspecified: Secondary | ICD-10-CM | POA: Diagnosis not present

## 2014-05-05 DIAGNOSIS — C50912 Malignant neoplasm of unspecified site of left female breast: Secondary | ICD-10-CM

## 2014-05-05 DIAGNOSIS — K219 Gastro-esophageal reflux disease without esophagitis: Secondary | ICD-10-CM | POA: Insufficient documentation

## 2014-05-05 DIAGNOSIS — N6012 Diffuse cystic mastopathy of left breast: Secondary | ICD-10-CM | POA: Diagnosis not present

## 2014-05-05 DIAGNOSIS — R921 Mammographic calcification found on diagnostic imaging of breast: Secondary | ICD-10-CM | POA: Diagnosis not present

## 2014-05-05 DIAGNOSIS — Z87891 Personal history of nicotine dependence: Secondary | ICD-10-CM | POA: Insufficient documentation

## 2014-05-05 DIAGNOSIS — E039 Hypothyroidism, unspecified: Secondary | ICD-10-CM | POA: Insufficient documentation

## 2014-05-05 DIAGNOSIS — F419 Anxiety disorder, unspecified: Secondary | ICD-10-CM | POA: Insufficient documentation

## 2014-05-05 DIAGNOSIS — M1990 Unspecified osteoarthritis, unspecified site: Secondary | ICD-10-CM | POA: Insufficient documentation

## 2014-05-05 DIAGNOSIS — Z85828 Personal history of other malignant neoplasm of skin: Secondary | ICD-10-CM | POA: Insufficient documentation

## 2014-05-05 DIAGNOSIS — F329 Major depressive disorder, single episode, unspecified: Secondary | ICD-10-CM | POA: Insufficient documentation

## 2014-05-05 DIAGNOSIS — Z803 Family history of malignant neoplasm of breast: Secondary | ICD-10-CM | POA: Insufficient documentation

## 2014-05-05 DIAGNOSIS — C50919 Malignant neoplasm of unspecified site of unspecified female breast: Secondary | ICD-10-CM | POA: Diagnosis present

## 2014-05-05 DIAGNOSIS — E780 Pure hypercholesterolemia: Secondary | ICD-10-CM | POA: Insufficient documentation

## 2014-05-05 DIAGNOSIS — C50911 Malignant neoplasm of unspecified site of right female breast: Secondary | ICD-10-CM

## 2014-05-05 DIAGNOSIS — Z171 Estrogen receptor negative status [ER-]: Secondary | ICD-10-CM | POA: Insufficient documentation

## 2014-05-05 DIAGNOSIS — Z9011 Acquired absence of right breast and nipple: Secondary | ICD-10-CM | POA: Insufficient documentation

## 2014-05-05 DIAGNOSIS — G8918 Other acute postprocedural pain: Secondary | ICD-10-CM | POA: Diagnosis not present

## 2014-05-05 DIAGNOSIS — Z853 Personal history of malignant neoplasm of breast: Secondary | ICD-10-CM | POA: Insufficient documentation

## 2014-05-05 HISTORY — PX: RADIOACTIVE SEED GUIDED PARTIAL MASTECTOMY WITH AXILLARY SENTINEL LYMPH NODE BIOPSY: SHX6520

## 2014-05-05 HISTORY — DX: Presence of spectacles and contact lenses: Z97.3

## 2014-05-05 HISTORY — DX: Other specified postprocedural states: R11.2

## 2014-05-05 HISTORY — DX: Other specified postprocedural states: Z98.890

## 2014-05-05 SURGERY — RADIOACTIVE SEED GUIDED PARTIAL MASTECTOMY WITH AXILLARY SENTINEL LYMPH NODE BIOPSY
Anesthesia: Regional | Site: Breast | Laterality: Left

## 2014-05-05 MED ORDER — HYDROCODONE-ACETAMINOPHEN 5-325 MG PO TABS: 1.0000 | ORAL_TABLET | Freq: Four times a day (QID) | ORAL | Status: DC | PRN

## 2014-05-05 MED ORDER — LACTATED RINGERS IV SOLN
INTRAVENOUS | Status: DC
Start: 2014-05-05 — End: 2014-05-05
  Administered 2014-05-05 (×2): via INTRAVENOUS

## 2014-05-05 MED ORDER — HYDROCODONE-ACETAMINOPHEN 5-325 MG PO TABS
1.0000 | ORAL_TABLET | ORAL | Status: DC | PRN
  Administered 2014-05-05: 1 via ORAL
  Filled 2014-05-05: qty 1

## 2014-05-05 MED ORDER — EPHEDRINE SULFATE 50 MG/ML IJ SOLN
INTRAMUSCULAR | Status: DC | PRN
  Administered 2014-05-05: 10 mg via INTRAVENOUS

## 2014-05-05 MED ORDER — FENTANYL CITRATE 0.05 MG/ML IJ SOLN
INTRAMUSCULAR | Status: DC | PRN
  Administered 2014-05-05: 50 ug via INTRAVENOUS

## 2014-05-05 MED ORDER — ONDANSETRON HCL 4 MG/2ML IJ SOLN: 4.0000 mg | Freq: Four times a day (QID) | INTRAMUSCULAR | Status: DC | PRN

## 2014-05-05 MED ORDER — NADOLOL 20 MG PO TABS: 10.0000 mg | ORAL_TABLET | Freq: Every day | ORAL | Status: DC

## 2014-05-05 MED ORDER — FENTANYL CITRATE 0.05 MG/ML IJ SOLN
INTRAMUSCULAR | Status: AC
  Filled 2014-05-05: qty 2

## 2014-05-05 MED ORDER — ACETAMINOPHEN 650 MG RE SUPP: 650.0000 mg | Freq: Four times a day (QID) | RECTAL | Status: DC | PRN

## 2014-05-05 MED ORDER — FENTANYL CITRATE 0.05 MG/ML IJ SOLN
INTRAMUSCULAR | Status: AC
  Filled 2014-05-05: qty 6

## 2014-05-05 MED ORDER — DEXAMETHASONE SODIUM PHOSPHATE 4 MG/ML IJ SOLN
INTRAMUSCULAR | Status: DC | PRN
  Administered 2014-05-05: 10 mg via INTRAVENOUS

## 2014-05-05 MED ORDER — FENTANYL CITRATE 0.05 MG/ML IJ SOLN
50.0000 ug | INTRAMUSCULAR | Status: DC | PRN
Start: 2014-05-05 — End: 2014-05-05
  Administered 2014-05-05: 100 ug via INTRAVENOUS

## 2014-05-05 MED ORDER — BUPIVACAINE-EPINEPHRINE (PF) 0.5% -1:200000 IJ SOLN
INTRAMUSCULAR | Status: DC | PRN
  Administered 2014-05-05: 30 mL via PERINEURAL

## 2014-05-05 MED ORDER — VANCOMYCIN HCL IN DEXTROSE 1-5 GM/200ML-% IV SOLN
INTRAVENOUS | Status: AC
  Filled 2014-05-05: qty 200

## 2014-05-05 MED ORDER — ACETAMINOPHEN 325 MG PO TABS: 650.0000 mg | ORAL_TABLET | Freq: Four times a day (QID) | ORAL | Status: DC | PRN

## 2014-05-05 MED ORDER — MIDAZOLAM HCL 2 MG/2ML IJ SOLN: 1.0000 mg | INTRAMUSCULAR | Status: DC | PRN

## 2014-05-05 MED ORDER — GLYCOPYRROLATE 0.2 MG/ML IJ SOLN
INTRAMUSCULAR | Status: DC | PRN
  Administered 2014-05-05: 0.2 mg via INTRAVENOUS

## 2014-05-05 MED ORDER — PROPOFOL 10 MG/ML IV BOLUS
INTRAVENOUS | Status: DC | PRN
  Administered 2014-05-05: 150 mg via INTRAVENOUS

## 2014-05-05 MED ORDER — PANTOPRAZOLE SODIUM 40 MG PO TBEC: 40.0000 mg | DELAYED_RELEASE_TABLET | Freq: Every day | ORAL | Status: DC

## 2014-05-05 MED ORDER — OXYCODONE HCL 5 MG PO TABS: 5.0000 mg | ORAL_TABLET | Freq: Once | ORAL | Status: AC | PRN

## 2014-05-05 MED ORDER — TECHNETIUM TC 99M SULFUR COLLOID FILTERED
1.0000 | Freq: Once | INTRAVENOUS | Status: AC | PRN
  Administered 2014-05-05: 1 via INTRADERMAL

## 2014-05-05 MED ORDER — ONDANSETRON HCL 4 MG/2ML IJ SOLN
INTRAMUSCULAR | Status: DC | PRN
  Administered 2014-05-05: 4 mg via INTRAVENOUS

## 2014-05-05 MED ORDER — VANCOMYCIN HCL IN DEXTROSE 1-5 GM/200ML-% IV SOLN
1000.0000 mg | INTRAVENOUS | Status: AC
  Administered 2014-05-05: 1000 mg via INTRAVENOUS

## 2014-05-05 MED ORDER — OXYCODONE HCL 5 MG/5ML PO SOLN: 5.0000 mg | Freq: Once | ORAL | Status: AC | PRN

## 2014-05-05 MED ORDER — FENTANYL CITRATE 0.05 MG/ML IJ SOLN
25.0000 ug | INTRAMUSCULAR | Status: DC | PRN
  Administered 2014-05-05 (×2): 25 ug via INTRAVENOUS

## 2014-05-05 MED ORDER — LIDOCAINE HCL (CARDIAC) 20 MG/ML IV SOLN
INTRAVENOUS | Status: DC | PRN
  Administered 2014-05-05: 50 mg via INTRAVENOUS

## 2014-05-05 SURGICAL SUPPLY — 66 items
APL SKNCLS STERI-STRIP NONHPOA (GAUZE/BANDAGES/DRESSINGS)
APPLIER CLIP 9.375 MED OPEN (MISCELLANEOUS) ×3
APR CLP MED 9.3 20 MLT OPN (MISCELLANEOUS) ×1
BENZOIN TINCTURE PRP APPL 2/3 (GAUZE/BANDAGES/DRESSINGS) IMPLANT
BINDER BREAST LRG (GAUZE/BANDAGES/DRESSINGS) IMPLANT
BINDER BREAST MEDIUM (GAUZE/BANDAGES/DRESSINGS) ×2 IMPLANT
BINDER BREAST XLRG (GAUZE/BANDAGES/DRESSINGS) IMPLANT
BINDER BREAST XXLRG (GAUZE/BANDAGES/DRESSINGS) IMPLANT
BLADE SURG 15 STRL LF DISP TIS (BLADE) ×1 IMPLANT
BLADE SURG 15 STRL SS (BLADE) ×3
CANISTER SUC SOCK COL 7IN (MISCELLANEOUS) IMPLANT
CANISTER SUCT 1200ML W/VALVE (MISCELLANEOUS) ×2 IMPLANT
CHLORAPREP W/TINT 26ML (MISCELLANEOUS) ×3 IMPLANT
CLIP APPLIE 9.375 MED OPEN (MISCELLANEOUS) ×1 IMPLANT
CLOSURE WOUND 1/2 X4 (GAUZE/BANDAGES/DRESSINGS) ×1
COVER BACK TABLE 60X90IN (DRAPES) ×3 IMPLANT
COVER MAYO STAND STRL (DRAPES) ×3 IMPLANT
COVER PROBE W GEL 5X96 (DRAPES) ×3 IMPLANT
DECANTER SPIKE VIAL GLASS SM (MISCELLANEOUS) IMPLANT
DEVICE DUBIN W/COMP PLATE 8390 (MISCELLANEOUS) ×3 IMPLANT
DRAPE LAPAROSCOPIC ABDOMINAL (DRAPES) ×3 IMPLANT
DRAPE UTILITY XL STRL (DRAPES) ×3 IMPLANT
DRSG TEGADERM 4X4.75 (GAUZE/BANDAGES/DRESSINGS) IMPLANT
ELECT COATED BLADE 2.86 ST (ELECTRODE) ×3 IMPLANT
ELECT REM PT RETURN 9FT ADLT (ELECTROSURGICAL) ×3
ELECTRODE REM PT RTRN 9FT ADLT (ELECTROSURGICAL) ×1 IMPLANT
GLOVE BIO SURGEON STRL SZ7 (GLOVE) ×8 IMPLANT
GLOVE BIOGEL PI IND STRL 7.5 (GLOVE) ×1 IMPLANT
GLOVE BIOGEL PI INDICATOR 7.5 (GLOVE) ×4
GLOVE SURG SS PI 7.5 STRL IVOR (GLOVE) ×2 IMPLANT
GOWN STRL REUS W/ TWL LRG LVL3 (GOWN DISPOSABLE) ×2 IMPLANT
GOWN STRL REUS W/ TWL XL LVL3 (GOWN DISPOSABLE) IMPLANT
GOWN STRL REUS W/TWL LRG LVL3 (GOWN DISPOSABLE) ×6
GOWN STRL REUS W/TWL XL LVL3 (GOWN DISPOSABLE) ×3
KIT MARKER MARGIN INK (KITS) ×3 IMPLANT
LIQUID BAND (GAUZE/BANDAGES/DRESSINGS) ×3 IMPLANT
MARKER SKIN DUAL TIP RULER LAB (MISCELLANEOUS) ×6 IMPLANT
NDL HYPO 25X1 1.5 SAFETY (NEEDLE) ×1 IMPLANT
NDL SAFETY ECLIPSE 18X1.5 (NEEDLE) IMPLANT
NEEDLE HYPO 18GX1.5 SHARP (NEEDLE)
NEEDLE HYPO 25X1 1.5 SAFETY (NEEDLE) ×3 IMPLANT
NS IRRIG 1000ML POUR BTL (IV SOLUTION) IMPLANT
PACK BASIN DAY SURGERY FS (CUSTOM PROCEDURE TRAY) ×3 IMPLANT
PENCIL BUTTON HOLSTER BLD 10FT (ELECTRODE) ×3 IMPLANT
SHEET MEDIUM DRAPE 40X70 STRL (DRAPES) IMPLANT
SLEEVE SCD COMPRESS KNEE MED (MISCELLANEOUS) ×3 IMPLANT
SPONGE GAUZE 4X4 12PLY STER LF (GAUZE/BANDAGES/DRESSINGS) ×3 IMPLANT
SPONGE LAP 4X18 X RAY DECT (DISPOSABLE) ×3 IMPLANT
STAPLER VISISTAT 35W (STAPLE) ×3 IMPLANT
STOCKINETTE IMPERVIOUS LG (DRAPES) IMPLANT
STRIP CLOSURE SKIN 1/2X4 (GAUZE/BANDAGES/DRESSINGS) ×2 IMPLANT
SUT ETHILON 2 0 FS 18 (SUTURE) IMPLANT
SUT MNCRL AB 4-0 PS2 18 (SUTURE) ×3 IMPLANT
SUT MON AB 5-0 PS2 18 (SUTURE) ×4 IMPLANT
SUT SILK 2 0 SH (SUTURE) ×4 IMPLANT
SUT VIC AB 2-0 SH 27 (SUTURE) ×9
SUT VIC AB 2-0 SH 27XBRD (SUTURE) ×1 IMPLANT
SUT VIC AB 3-0 SH 27 (SUTURE) ×3
SUT VIC AB 3-0 SH 27X BRD (SUTURE) ×1 IMPLANT
SUT VIC AB 5-0 PS2 18 (SUTURE) IMPLANT
SYR CONTROL 10ML LL (SYRINGE) ×3 IMPLANT
TOWEL OR 17X24 6PK STRL BLUE (TOWEL DISPOSABLE) ×3 IMPLANT
TOWEL OR NON WOVEN STRL DISP B (DISPOSABLE) ×3 IMPLANT
TUBE CONNECTING 20'X1/4 (TUBING)
TUBE CONNECTING 20X1/4 (TUBING) ×1 IMPLANT
YANKAUER SUCT BULB TIP NO VENT (SUCTIONS) IMPLANT

## 2014-05-05 NOTE — Anesthesia Postprocedure Evaluation (Signed)
Anesthesia Post Note  Patient: Laura Li  Procedure(s) Performed: Procedure(s) (LRB): RADIOACTIVE SEED GUIDED LEFT LUMPECTOMY WITH AXILLARY SENTINEL LYMPH NODE BIOPSY (Left)  Anesthesia type: General  Patient location: PACU  Post pain: Pain level controlled and Adequate analgesia  Post assessment: Post-op Vital signs reviewed, Patient's Cardiovascular Status Stable, Respiratory Function Stable, Patent Airway and Pain level controlled  Last Vitals:  Filed Vitals:   05/05/14 1245  BP: 138/64  Pulse: 72  Temp:   Resp: 22    Post vital signs: Reviewed and stable  Level of consciousness: awake, alert  and oriented  Complications: No apparent anesthesia complications

## 2014-05-05 NOTE — Interval H&P Note (Signed)
History and Physical Interval Note:  06/17/1446 18:56 AM  Laura Li  has presented today for surgery, with the diagnosis of left breast cancer  The various methods of treatment have been discussed with the patient and family. After consideration of risks, benefits and other options for treatment, the patient has consented to  Procedure(s): RADIOACTIVE SEED GUIDED LEFT LUMPECTOMY WITH AXILLARY SENTINEL LYMPH NODE BIOPSY (Left) as a surgical intervention .  The patient's history has been reviewed, patient examined, no change in status, stable for surgery.  I have reviewed the patient's chart and labs.  Questions were answered to the patient's satisfaction.     Chela Sutphen

## 2014-05-05 NOTE — Anesthesia Procedure Notes (Addendum)
Anesthesia Regional Block:  Pectoralis block  Pre-Anesthetic Checklist: ,, timeout performed, Correct Patient, Correct Site, Correct Laterality, Correct Procedure, Correct Position, site marked, Risks and benefits discussed,  Surgical consent,  Pre-op evaluation,  At surgeon's request and post-op pain management  Laterality: Left  Prep: chloraprep       Needles:   Needle Type: Echogenic Needle     Needle Length: 9cm 9 cm Needle Gauge: 21 and 21 G    Additional Needles:  Procedures: ultrasound guided (picture in chart) Pectoralis block Narrative:  Start time: 05/05/2014 10:20 AM End time: 05/05/2014 10:30 AM Injection made incrementally with aspirations every 5 mL.  Performed by: Personally  Anesthesiologist: HODIERNE, ADAM  Additional Notes: Pt tolerated the procedure well.   Procedure Name: LMA Insertion Date/Time: 05/05/2014 10:45 AM Performed by: Toula Moos L Pre-anesthesia Checklist: Patient identified, Emergency Drugs available, Suction available, Patient being monitored and Timeout performed Patient Re-evaluated:Patient Re-evaluated prior to inductionOxygen Delivery Method: Circle System Utilized Preoxygenation: Pre-oxygenation with 100% oxygen Intubation Type: IV induction Ventilation: Mask ventilation without difficulty LMA: LMA inserted LMA Size: 4.0 Number of attempts: 1 Airway Equipment and Method: Bite block Placement Confirmation: positive ETCO2 Tube secured with: Tape Dental Injury: Teeth and Oropharynx as per pre-operative assessment

## 2014-05-05 NOTE — Anesthesia Preprocedure Evaluation (Signed)
Anesthesia Evaluation  Patient identified by MRN, date of birth, ID band Patient awake    Reviewed: Allergy & Precautions, NPO status , Patient's Chart, lab work & pertinent test results  History of Anesthesia Complications (+) PONV  Airway Mallampati: II   Neck ROM: full    Dental   Pulmonary former smoker,  breath sounds clear to auscultation        Cardiovascular hypertension, Rhythm:regular Rate:Normal     Neuro/Psych Depression    GI/Hepatic GERD-  ,  Endo/Other  Hypothyroidism   Renal/GU      Musculoskeletal  (+) Arthritis -,   Abdominal   Peds  Hematology   Anesthesia Other Findings   Reproductive/Obstetrics                             Anesthesia Physical Anesthesia Plan  ASA: II  Anesthesia Plan: General and Regional   Post-op Pain Management: MAC Combined w/ Regional for Post-op pain   Induction: Intravenous  Airway Management Planned: LMA  Additional Equipment:   Intra-op Plan:   Post-operative Plan:   Informed Consent: I have reviewed the patients History and Physical, chart, labs and discussed the procedure including the risks, benefits and alternatives for the proposed anesthesia with the patient or authorized representative who has indicated his/her understanding and acceptance.     Plan Discussed with: CRNA, Anesthesiologist and Surgeon  Anesthesia Plan Comments:         Anesthesia Quick Evaluation

## 2014-05-05 NOTE — H&P (Signed)
74 yof who is the mother of a friend presents with newly diagnosed left breast cancer. She has history of prior left breast excisional biopsy. She also has history of prior right breast cancer. She brought her old records. She had mod diff infiltrating ductal carcinoma diagnosed in November 1998. she underwent right mrm with a 3 cm tumor and 0/25 nodes. this was er/pr positive and her2 negative giving her a stage 2 breast cancer. She underwent AC x4 followed by tamoxifen which was discontinued and completed five years of femara. The tamoxifen was discontinued due to DVT in 2002. She has done well since then and recently underwent routine screening mm that showed a left breast abnormality. Her breast density is C. US shows a 1.3x0.7x0.6 cm breast mass in the six oclock location of the left breast. US of the axilla is negative. US guided core biopsy was done shows an invasive mammary carcinoma with some squamous features. This is a triple negative breast cancer and has a Ki67 of 10%.    Other Problems Sharman Crate, LPN; 06/11/3293 18:84 AM) Anxiety Disorder Arthritis Bladder Problems Breast Cancer Gastroesophageal Reflux Disease General anesthesia - complications High blood pressure Hypercholesterolemia Lump In Breast Melanoma Pulmonary Embolism / Blood Clot in Legs Transfusion history  Past Surgical History Sharman Crate, LPN; 03/16/6061 01:60 AM) Appendectomy Breast Biopsy Bilateral. multiple Breast Mass; Local Excision Bilateral. Hysterectomy (not due to cancer) - Complete Mastectomy Right. Tonsillectomy  Diagnostic Studies History Sharman Crate, LPN; 1/0/9323 55:73 AM) Colonoscopy 1-5 years ago Mammogram within last year Pap Smear 1-5 years ago  Allergies Sharman Crate, LPN; 04/12/252 27:06 AM) Dyazide *DIURETICS* Niacin (Antihyperlipidemic) *ANTIHYPERLIPIDEMICS*  Medication History Sharman Crate, LPN; 04/13/7626 31:51 AM) Nyra Jabs (0.6%  Suspension, Ophthalmic) Active. PrednisoLONE Acetate (1% Suspension, Ophthalmic) Active. Tylenol Extra Strength ($RemoveBeforeD'500MG'aADDwaRANCwMQi$  Tablet, Oral) Active. Lipitor ($RemoveBeforeDE'20MG'DDmBzCzrMHFNDfF$  Tablet, Oral) Active. Calcium 600 ($RemoveBefo'1500MG'adSgnHhBqvO$  Tablet, Oral) Active. Vitamin B Complex (Oral) Active. NexIUM ($RemoveBeforeD'40MG'RzxatUhfRsIahM$  Capsule DR, Oral) Active. Allegra Allergy ($RemoveBeforeDE'180MG'sQjbxwtbJwsGefJ$  Tablet, Oral) Active. Levothyroxine Sodium (75MCG Tablet, Oral) Active. Effexor XR ($RemoveBef'75MG'jSbmhpUQvH$  Capsule ER 24HR, Oral) Active. Corgard ($RemoveBeforeDE'20MG'LIaBmNqnHHZafuK$  Tablet, Oral) Active.  Social History Sharman Crate, LPN; 09/13/1605 37:10 AM) Alcohol use Occasional alcohol use. Caffeine use Tea. No drug use Tobacco use Former smoker.  Family History Sharman Crate, LPN; 08/11/6946 54:62 AM) Breast Cancer Family Members In General. Cancer Mother. Depression Mother. Heart Disease Father. Heart disease in female family member before age 56 Hypertension Father. Migraine Headache Mother. Thyroid problems Sister.   Review of Systems Clarise Cruz [facility] LPN; 7/0/3500 93:81 AM) HEENT Present- Hearing Loss, Seasonal Allergies and Wears glasses/contact lenses. Not Present- Earache, Hoarseness, Nose Bleed, Oral Ulcers, Ringing in the Ears, Sinus Pain, Sore Throat, Visual Disturbances and Yellow Eyes. Breast Present- Breast Mass. Not Present- Breast Pain, Nipple Discharge and Skin Changes. Female Genitourinary Present- Nocturia. Not Present- Frequency, Painful Urination, Pelvic Pain and Urgency. Musculoskeletal Present- Joint Pain and Joint Stiffness. Not Present- Back Pain, Muscle Pain, Muscle Weakness and Swelling of Extremities.   Vitals Clarise Cruz [facility] LPN; 10/11/9935 16:96 AM) 04/15/2014 10:47 AM Weight: 146.38 lb Height: 66in Body Surface Area: 1.76 m Body Mass Index: 23.63 kg/m Temp.: 97.37F(Oral)  Pulse: 68 (Regular)  BP: 130/70 (Sitting, Left Arm, Standard)  Physical Exam Rolm Bookbinder MD; 04/15/2014 11:51 AM) General Mental Status-Alert. Orientation-Oriented  X3. Chest and Lung Exam Chest and lung exam reveals -on auscultation, normal breath sounds, no adventitious sounds and normal vocal resonance. Breast Nipples-No Discharge. Breast - Left-Lumpectomy scar. Breast - Right-Mastectomy scar. Breast Lump-No Palpable Breast Mass. Cardiovascular  Cardiovascular examination reveals -normal heart sounds, regular rate and rhythm with no murmurs. Lymphatic Head & Neck General Head & Neck Lymphatics: Bilateral - Description - Normal. Axillary General Axillary Region: Bilateral - Description - Normal. Note: no [place] adenopathy   Assessment & Plan Rolm Bookbinder MD; 04/15/2014 12:43 PM) STAGE I BREAST CANCER, LEFT (174.9  C50.912) Left breast radioactive seed guided lumpectomy, left axillary sentinel node biopsy  we discussed proceeding again with lumpectomy and sn biopsy. Will await final path and possible Mammaprint on deciding about chemotherapy. we discussed surgery again specifically positive margins requiring reexcision. Will proceed next week.

## 2014-05-05 NOTE — Progress Notes (Signed)
Emotional support during breast injections °

## 2014-05-05 NOTE — Op Note (Signed)
Preoperative diagnosis: Clinical stage I left breast cancer Postoperative diagnosis: Same as above Procedure: #1 left breast radioactive seed and lumpectomy #2 left axillary sentinel lymph node biopsy Surgeon: Dr. Serita Grammes Anesthesia: Gen. wih pectoral block Estimated blood loss: Minimal Drains: None Specimens: #1 left breast issue marked with paint #2 left axilla sentinel lymph node with count of 139 #3 additional superior margin, marked short superior, long lateral, double deep #4 additional posterior margin marked short superior , long lateral, double deep Complications none Sponge count correct at completion Dispo to recovery stable  Indications: This is a 61 yof with prior right breast cancer who has a newly diagnosed left breast tn cancer that appears to be stage I. We discussed all options and have decided to proceed with lumpectomy/sn biopsy. She had a radioactive seed placed at the lesion prior to beginning.  Procedure: After informed consent was obtained the patient was first injected with technetium in the standard periareolar fashion. She also underwent a left pectoral block. She was given cefazolin. Sequential compression devices were on her legs. She then underwent general anesthesia without complication. Her left breast was prepped and draped in the standard sterile surgical fashion. A surgical timeout was then performed. I had her mm in the operating room.    I then located the radioactive seed. I made a periareolar incision. I used the neoprobe to guide the excision of the seed in the surrounding tissue with an attempt to get a clear margin. The seed was confirmed to be in the specimen with the neoprobe. There was no more radioactivity remaining in the breast. Faxitron mammogram was taken which confirmed removal of the mass clip and seed. This was confirmed by radiology and later by pathology.Pathology thought my superior and posterior margins might be close so I did  excise additional tissue at these locations and sent as separate specimens.  I placed clips around the margins of my cavity.  I then made an incision below the axillary hairline. I entered the axilla. I used the neoprobe to identify a sentinel lymph node. There were no enlarged nodes. There is no background radioactivity. I then obtained hemostasis. I closed the axillary fascia with 2-0 Vicryl. I then closed the breast tissue with 2-0 Vicryl. The skin was closed with 3-0 Vicryl and 4-0 Monocryl in the axilla and 5-0 monocryl at the nac. [name] Glue was placed. A breast binder was placed. She tolerated this well and was extubated and transferred to the recovery room in stable condition.

## 2014-05-05 NOTE — Progress Notes (Signed)
Assisted Dr. Marcie Bal with left, ultrasound guided, pectoralis block. Side rails up, monitors on throughout procedure. See vital signs in flow sheet. Tolerated Procedure well.

## 2014-05-05 NOTE — Transfer of Care (Signed)
Immediate Anesthesia Transfer of Care Note  Patient: Laura Li  Procedure(s) Performed: Procedure(s): RADIOACTIVE SEED GUIDED LEFT LUMPECTOMY WITH AXILLARY SENTINEL LYMPH NODE BIOPSY (Left)  Patient Location: PACU  Anesthesia Type:GA combined with regional for post-op pain  Level of Consciousness: awake, alert  and patient cooperative  Airway & Oxygen Therapy: Patient Spontanous Breathing and Patient connected to face mask oxygen  Post-op Assessment: Report given to RN, Post -op Vital signs reviewed and stable and Patient moving all extremities  Post vital signs: Reviewed and stable  Last Vitals:  Filed Vitals:   05/05/14 1029  BP:   Pulse: 65  Temp:   Resp: 19    Complications: No apparent anesthesia complications

## 2014-05-06 NOTE — Discharge Instructions (Signed)
[facility] °Office Phone Number [phone] °POST OP INSTRUCTIONS ° °Always review your discharge instruction sheet given to you by the facility where your surgery was performed. ° °IF YOU HAVE DISABILITY OR FAMILY LEAVE FORMS, YOU MUST BRING THEM TO THE OFFICE FOR PROCESSING.  DO NOT GIVE THEM TO YOUR DOCTOR. ° °1. A prescription for pain medication may be given to you upon discharge.  Take your pain medication as prescribed, if needed.  If narcotic pain medicine is not needed, then you may take acetaminophen (Tylenol), naprosyn (Alleve) or ibuprofen (Advil) as needed. °2. Take your usually prescribed medications unless otherwise directed °3. If you need a refill on your pain medication, please contact your pharmacy.  They will contact our office to request authorization.  Prescriptions will not be filled after 5pm or on week-ends. °4. You should eat very light the first 24 hours after surgery, such as soup, crackers, pudding, etc.  Resume your normal diet the day after surgery. °5. Most patients will experience some swelling and bruising in the breast.  Ice packs and a good support bra will help.  Wear the breast binder provided or a sports bra for 72 hours day and night.  After that wear a sports bra during the day until you return to the office. Swelling and bruising can take several days to resolve.  °6. It is common to experience some constipation if taking pain medication after surgery.  Increasing fluid intake and taking a stool softener will usually help or prevent this problem from occurring.  A mild laxative (Milk of Magnesia or Miralax) should be taken according to package directions if there are no bowel movements after 48 hours. °7. Unless discharge instructions indicate otherwise, you may remove your bandages 48 hours after surgery and you may shower at that time.  You may have steri-strips (small skin tapes) in place directly over the incision.  These strips should be left on the  skin for 7-10 days and will come off on their own.  If your surgeon used skin glue on the incision, you may shower in 24 hours.  The glue will flake off over the next 2-3 weeks.  Any sutures or staples will be removed at the office during your follow-up visit. °8. ACTIVITIES:  You may resume regular daily activities (gradually increasing) beginning the next day.  Wearing a good support bra or sports bra minimizes pain and swelling.  You may have sexual intercourse when it is comfortable. °a. You may drive when you no longer are taking prescription pain medication, you can comfortably wear a seatbelt, and you can safely maneuver your car and apply brakes. °b. RETURN TO WORK:  ______________________________________________________________________________________ °9. You should see your doctor in the office for a follow-up appointment approximately two weeks after your surgery.  Your doctor’s nurse will typically make your follow-up appointment when she calls you with your pathology report.  Expect your pathology report 3-4 business days after your surgery.  You may call to check if you do not hear from us after three days. °10. OTHER INSTRUCTIONS: _______________________________________________________________________________________________ _____________________________________________________________________________________________________________________________________ °_____________________________________________________________________________________________________________________________________ °_____________________________________________________________________________________________________________________________________ ° °WHEN TO CALL DR WAKEFIELD: °1. Fever over 101.0 °2. Nausea and/or vomiting. °3. Extreme swelling or bruising. °4. Continued bleeding from incision. °5. Increased pain, redness, or drainage from the incision. ° °The clinic staff is available to answer your questions during regular  business hours.  Please don’t hesitate to call and ask to speak to one of the nurses for clinical concerns.  If you   have a medical emergency, go to the nearest emergency room or call 911.  A surgeon from [facility] is always on call at the hospital. ° °For further questions, please visit [email or web address] ° °

## 2014-05-06 NOTE — Discharge Summary (Signed)
Physician Discharge Summary  Patient ID: Laura Li MRN: [id number] DOB/AGE: 07-24-1940 74 y.o.  Admit date: 05/05/2014 Discharge date: 05/06/2014  Admission Diagnoses: Breast cancer  Discharge Diagnoses:  Active Problems:   Breast cancer   Discharged Condition: good  Hospital Course: 89 yof s/p left lumpectomy/sn biopsy for breast cancer with prior history of right breast cancer. She remained overnight for monitoring and is doing well the following morning without any complaints   Consults: None  Significant Diagnostic Studies: none  Treatments: surgery: see above  Discharge Exam: Blood pressure 109/53, pulse 55, temperature 97 F (36.1 C), temperature source Oral, resp. rate 16, height 5\' 6"  (1.676 m), weight 145 lb (65.772 kg), SpO2 100 %.  Disposition: 01-Home or Self Care     Medication List    TAKE these medications        acetaminophen 500 MG tablet  Commonly known as:  TYLENOL  Take 500 mg by mouth 2 (two) times daily as needed for mild pain.     atorvastatin 20 MG tablet  Commonly known as:  LIPITOR  Take 1 tablet (20 mg total) by mouth daily.     CALTRATE 600 PLUS-VIT D PO  Take 1 tablet by mouth 2 (two) times daily.     Bricelyn by mouth.     esomeprazole 40 MG capsule  Commonly known as:  NEXIUM  TAKE 1 CAPSULE DAILY     fexofenadine 180 MG tablet  Commonly known as:  ALLEGRA  Take 180 mg by mouth daily.     HYDROcodone-acetaminophen 5-325 MG per tablet  Commonly known as:  NORCO/VICODIN  Take 1 tablet by mouth every 6 (six) hours as needed for moderate pain or severe pain.     ipratropium 0.03 % nasal spray  Commonly known as:  ATROVENT  Place 2 sprays into the nose every 12 (twelve) hours.     levothyroxine 75 MCG tablet  Commonly known as:  SYNTHROID, LEVOTHROID  Take 1 tablet (75 mcg total) by mouth daily.     nadolol 20 MG tablet  Commonly known as:  CORGARD  Take 0.5 tablets (10 mg total) by  mouth daily.     STRESS FORMULA PO  Take 1 tablet by mouth daily.     SYSTANE 0.4-0.3 % Soln  Generic drug:  Polyethyl Glycol-Propyl Glycol  Apply 1 drop to eye 3 (three) times daily as needed (for dryness/itching).     venlafaxine XR 75 MG 24 hr capsule  Commonly known as:  EFFEXOR-XR  Take 1 capsule (75 mg total) by mouth daily.           Follow-up Information    Follow up with [facility], [facility] In 2 weeks.   Specialty:  General Surgery   Contact information:   [place] [id number] [phone]       Signed: Rolm Bookbinder 05/06/2014, 8:04 AM

## 2014-05-09 ENCOUNTER — Encounter (HOSPITAL_BASED_OUTPATIENT_CLINIC_OR_DEPARTMENT_OTHER): Payer: Self-pay | Admitting: General Surgery

## 2014-05-09 ENCOUNTER — Telehealth: Payer: Self-pay | Admitting: *Deleted

## 2014-05-09 NOTE — Telephone Encounter (Signed)
Received order for mammaprint from Dr. Jana Hakim. Requisition sent to [facility] for testing.

## 2014-05-16 ENCOUNTER — Encounter: Payer: Self-pay | Admitting: *Deleted

## 2014-05-19 ENCOUNTER — Encounter (HOSPITAL_COMMUNITY): Payer: Self-pay

## 2014-05-24 ENCOUNTER — Other Ambulatory Visit: Payer: Self-pay | Admitting: Oncology

## 2014-05-24 ENCOUNTER — Ambulatory Visit: Payer: Medicare Other | Attending: General Surgery | Admitting: Physical Therapy

## 2014-05-24 DIAGNOSIS — Z853 Personal history of malignant neoplasm of breast: Secondary | ICD-10-CM | POA: Insufficient documentation

## 2014-05-24 DIAGNOSIS — R1012 Left upper quadrant pain: Secondary | ICD-10-CM | POA: Diagnosis not present

## 2014-05-24 DIAGNOSIS — I890 Lymphedema, not elsewhere classified: Secondary | ICD-10-CM | POA: Diagnosis not present

## 2014-05-24 NOTE — Therapy (Signed)
[place], [place], [address] Phone: [phone]   Fax:  [phone]  Physical Therapy Evaluation  Patient Details  Name: Laura Li MRN: [id number] Date of Birth: 16-Jun-1940 Referring Provider:  Rolm Bookbinder, MD  Encounter Date: 05/24/2014      PT End of Session - 05/24/14 1534    Visit Number 1   Number of Visits 5   Date for PT Re-Evaluation 06/24/14  1-2 x per week depending on other appts.   PT Start Time 1430   PT Stop Time 1517   PT Time Calculation (min) 47 min   Activity Tolerance Patient tolerated treatment well   Behavior During Therapy WFL for tasks assessed/performed      Past Medical History  Diagnosis Date  . OA (osteoarthritis)   . History of DVT (deep vein thrombosis)   . Depression   . Hyperlipidemia   . Hypertension   . Hypothyroidism   . HX: breast cancer     melanoma  . Melanoma   . Eczema   . Arthritis   . Esophageal stricture   . GERD (gastroesophageal reflux disease)   . PONV (postoperative nausea and vomiting)   . Wears glasses     Past Surgical History  Procedure Laterality Date  . Appendectomy  1967  . Melanoma excision  2004    right side of face  . 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 Total abdominal hysterectomy  1992    including ovaries  . Tonsillectomy and adenoidectomy  1949  . Lipoma excision  1978    right breast  . Mastectomy  1998    right-nodes out  . Breast lumpectomy      left breast-benign  . Breast surgery  1977    removal of calcified milk gland right breast  . Colonoscopy    . Radioactive seed guided mastectomy with axillary sentinel lymph node biopsy Left 05/05/2014    Procedure: RADIOACTIVE SEED GUIDED LEFT LUMPECTOMY WITH AXILLARY SENTINEL LYMPH NODE BIOPSY;  Surgeon: Rolm Bookbinder, MD;  Location: [facility];  Service: General;  Laterality: Left;    There were no vitals filed for this  visit.  Visit Diagnosis:  Left upper quadrant pain  Secondary lymphedema      Subjective Assessment - 05/24/14 1532    Symptoms soreness under left arm, back of shoulder and in left breast   Pertinent History rigth mastectomy in 1998 with ALND, chemo and radiation, some pain in knees., melanoma on face   Patient Stated Goals to find out what precautions to take against lymphedema, get rid of soreness in arm, start a walking program inside somewhere   Currently in Pain? Yes   Pain Score 1    Pain Location Arm   Pain Orientation Left   Pain Descriptors / Indicators Sore   Pain Type Surgical pain            [facility] PT Assessment - 05/24/14 0001    Assessment   Medical Diagnosis left breast lumpectomy 05/05/2014   Precautions   Precautions --  cancer    Restrictions   Weight Bearing Restrictions No   Balance Screen   Has the patient fallen in the past 6 months No   Has the patient had a decrease in activity level because of a fear of falling?  No   Is the patient reluctant to leave their home because of a fear of falling?  No   Home Environment   Living Enviornment Private residence   Living Arrangements Alone   Available Help at Discharge Available PRN/intermittently  daugter lives in town   Prior Function   Level of Independence Independent with basic ADLs;Independent with homemaking with ambulation;Independent with gait;Independent with transfers   Vocation Retired   Psychologist, forensic ,    Cognition   Overall Cognitive Status Within Functional Limits for tasks assessed   Observation/Other Assessments   Observations well groomed, ambulates without device   Skin Integrity healing incision left axilla, uncovered with some puckering along incision line, steristrips over incision below nipple with some edema and pink tissue at lower middle quadrant of left breast, not warm or tender.    Sensation   Light Touch Appears Intact   Coordination   Gross Motor Movements are Fluid and  Coordinated Yes   Posture/Postural Control   Posture/Postural Control Postural limitations   Postural Limitations Rounded Shoulders;Forward head   AROM   Right/Left Shoulder Right;Left   Right Shoulder Flexion 164 Degrees   Right Shoulder ABduction 172 Degrees   Left Shoulder Flexion 174 Degrees   Left Shoulder ABduction 180 Degrees   Strength   Right Shoulder Flexion 5/5   Right Shoulder ABduction 5/5   Left Shoulder Flexion 4/5   Left Shoulder ABduction 4/5   Palpation   Palpation did not test due to recent surgery           LYMPHEDEMA/ONCOLOGY QUESTIONNAIRE - 05/24/14 1441    Type   Cancer Type breast   Surgeries   Mastectomy Date 03/11/96  right    Lumpectomy Date 05/05/14  left   Sentinel Lymph Node Biopsy Date 05/05/14   Axillary Lymph Node Dissection Date 03/11/96   right    Number Lymph Nodes Removed --  all from right in 1998, 1 from left 2.5 weeks ago    Treatment   Active Chemotherapy Treatment --  plans to have chemo.md appt next week    Past Chemotherapy Treatment Yes   Active Radiation Treatment --  plans to have radiation   Past Radiation Treatment No   What other symptoms do you have   Are you Having Heaviness or Tightness No   Are you having Pain Yes   Are you having pitting edema No   Is it Hard or Difficult finding clothes that fit No   Do you have infections No   Is there Decreased scar mobility --  not tested    Stemmer Sign No   Right Upper Extremity Lymphedema   10 cm Proximal to Olecranon Process 27.9 cm   Olecranon Process 23.9 cm   10 cm Proximal to Ulnar Styloid Process 19.8 cm   Just Proximal to Ulnar Styloid Process 14 cm   Across Hand at [employer] 19.3 cm   At [place] of 2nd Digit 5.7 cm   Left Upper Extremity Lymphedema   10 cm Proximal to Olecranon Process 27 cm   Olecranon Process 23.4 cm   10 cm Proximal to Ulnar Styloid Process 19 cm   Just Proximal to Ulnar Styloid Process 14 cm   Across Hand at [employer]  18.9 cm   At [place] of 2nd Digit 5.5 cm           Quick Dash - 05/24/14 0001    Open a tight or new jar No difficulty   Do heavy household chores (wash walls, wash floors) No difficulty   Carry a shopping bag  or briefcase No difficulty   Wash your back No difficulty   Use a knife to cut food No difficulty   Recreational activities in which you take some force or impact through your arm, shoulder, or hand (golf, hammering, tennis) No difficulty   During the past week, to what extent has your arm, shoulder or hand problem interfered with your normal social activities with family, friends, neighbors, or groups? Not at all   During the past week, to what extent has your arm, shoulder or hand problem limited your work or other regular daily activities Not at all   Arm, shoulder, or hand pain. Mild   Tingling (pins and needles) in your arm, shoulder, or hand Mild   Difficulty Sleeping No difficulty   DASH Score 4.55 %                     PT Education - 05/24/14 1526    Education provided Yes   Education Details pamphlet for ABC class    Person(s) Educated Patient   Methods Explanation;Handout   Comprehension Verbalized understanding           Short Term Clinic Goals - 05/24/14 1546    CC Short Term Goal  #1   Title short term goals=long term goals             Long Term Clinic Goals - 05/24/14 1546    CC Long Term Goal  #1   Title pt will verbalize an understanding of lymphedema risk reduction practices   Time 4   Period Weeks   Status New   CC Long Term Goal  #2   Title pt will be independent in home exercise program for shoulder range of motion and strength   Time 4   Period Weeks   Status New   CC Long Term Goal  #3   Title pt will report 50% less pain in left upper quadrant   Time 4   Period Weeks   Status New   CC Long Term Goal  #4   Title pt will demonstrate ability to ambulate on treadmill for 10 minutes so she can easily transiition to  community exercise program   Time 4   Period Weeks   Status New            Plan - 05/24/14 1537    Clinical Impression Statement Pt present 2.5 weeks after surgery for left breast lympectomy and sentinel node dissection.  Her incisions are still healing and she has c/o pain in upper quadrant and wants to start a walking program. Small patch of 1/4 foam placed in small tg soft for her to place at breast edema site. Issued informatoin about ABC class    Pt will benefit from skilled therapeutic intervention in order to improve on the following deficits Pain;Increased edema;Impaired perceived functional ability  pt feels like she needs to start a walking program   Rehab Potential Excellent   Clinical Impairments Affecting Rehab Potential previous masectomy, chemo, radiation and ALND of right breast.   PT Frequency --  4 visits    PT Duration 3 weeks   PT Treatment/Interventions ADLs/Self Care Home Management;Therapeutic exercise;Patient/family education;Manual techniques;Manual lymph drainage;Therapeutic activities   PT Next Visit Plan assess edema of left breast, manual lymph drainage if needed, supine cane exercise, home ex for left shoulder AROM, lymphedema risk reduction practices, begin treadmill walking.   Recommended Other Services ABC Class   Consulted and Agree with Plan  of Care Patient          G-Codes - 29-May-2014 1550    Functional Assessment Tool Used clinical  judgement    Functional Limitation Self care   Self Care Current Status [phone]) At least 1 percent but less than 20 percent impaired, limited or restricted   Self Care Goal Status (J1941) 0 percent impaired, limited or restricted       Problem List Patient Active Problem List   Diagnosis Date Noted  . Breast cancer 05/05/2014  . Bilateral breast cancer 04/29/2014  . Family history of breast cancer 04/29/2014  . Breast cancer, left breast 04/24/2014  . Breast cancer, right breast 04/24/2014  . GERD  (gastroesophageal reflux disease) 03/31/2014  . Hot flashes 03/31/2014  . Osteoarthritis 03/31/2014  . Melanoma of skin 03/31/2014  . Chronic kidney disease, stage III (moderate) 12/09/2012  . 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  . Vasculitis 11/09/2012  . DVT, HX OF 12/30/2006  . Hypothyroidism 02/06/2006  . Dyslipidemia 02/06/2006  . Essential hypertension 02/06/2006  . Allergic rhinitis 02/06/2006  . Osteopenia 02/06/2006   Donato Heinz. Owens Shark, PT  29-May-2014, 3:51 PM  [place], [place], [address] Phone: [phone]   Fax:  [phone]

## 2014-05-28 ENCOUNTER — Other Ambulatory Visit: Payer: Self-pay | Admitting: Oncology

## 2014-05-30 ENCOUNTER — Encounter: Payer: Self-pay | Admitting: Physical Therapy

## 2014-05-30 ENCOUNTER — Ambulatory Visit: Payer: Medicare Other | Admitting: Physical Therapy

## 2014-05-30 ENCOUNTER — Encounter: Payer: Self-pay | Admitting: Genetic Counselor

## 2014-05-30 ENCOUNTER — Other Ambulatory Visit: Payer: Self-pay | Admitting: Oncology

## 2014-05-30 DIAGNOSIS — C50911 Malignant neoplasm of unspecified site of right female breast: Secondary | ICD-10-CM

## 2014-05-30 DIAGNOSIS — C439 Malignant melanoma of skin, unspecified: Secondary | ICD-10-CM

## 2014-05-30 DIAGNOSIS — M25612 Stiffness of left shoulder, not elsewhere classified: Secondary | ICD-10-CM

## 2014-05-30 DIAGNOSIS — Z853 Personal history of malignant neoplasm of breast: Secondary | ICD-10-CM | POA: Diagnosis not present

## 2014-05-30 DIAGNOSIS — I890 Lymphedema, not elsewhere classified: Secondary | ICD-10-CM | POA: Diagnosis not present

## 2014-05-30 DIAGNOSIS — C50912 Malignant neoplasm of unspecified site of left female breast: Secondary | ICD-10-CM

## 2014-05-30 DIAGNOSIS — R1012 Left upper quadrant pain: Secondary | ICD-10-CM | POA: Diagnosis not present

## 2014-05-30 DIAGNOSIS — Z803 Family history of malignant neoplasm of breast: Secondary | ICD-10-CM

## 2014-05-30 DIAGNOSIS — Z9189 Other specified personal risk factors, not elsewhere classified: Secondary | ICD-10-CM

## 2014-05-30 NOTE — Progress Notes (Unsigned)
includes sequencing and deletion/duplication analysis of several genes associated with an increased risk for cancer via a gene panel. The OvaNext gene panel offered by [employer] includes sequencing and rearrangement analysis for the following 24 genes:ATM, BARD1, BRCA1, BRCA2, BRIP1, CDH1, CHEK2, EPCAM, MLH1, MRE11A, MSH2, MSH6, MUTYH, NBN, NF1, PALB2, PMS2, PTEN, RAD50, RAD51C, RAD51D, SMARCA4, STK11, and TP53.   Genetic testing for this gene panel was normal and did not reveal a pathogenic mutation in any of these genes. A copy of the genetic test report will be scanned into Epic under the media tab. Genetic testing did identify two variants of uncertain significance called MLH1, c.-230G>C and NBN, p.T76N. At this time, it is unknown if these variants are associated with an increased risk for cancer or if these are normal findings. With time, we suspect the lab will reclassify this variant and when they do, we will try to re-contact Ms. Picado to discuss the reclassification further.

## 2014-05-30 NOTE — Progress Notes (Signed)
°[facility] Genetics [facility] ° °Genetic Test Results ° ° °REFERRING PROVIDER: °Dr. Gustav Magrinat ° °PRIMARY PROVIDER:  °HUNTER, STEPHEN, MD ° °PRIMARY REASON FOR VISIT:  °Personal history of bilateral breast cancer ° °GENETIC TEST RESULT:  °Testing Laboratory: [facility]  °Test Ordered: OvaNext gene panel °Date of Report: 05/26/2014 °Result: Normal, no pathogenic mutations identified °Other: Two variants of uncertain significance identified called MLH1, c.-230G>C and NBN, p.T76N °General Interpretation: Reassuring for patient but recommend further testing in family. ° °HPI: Laura Li was previously seen in the [facility] due to concerns regarding a hereditary predisposition to cancer. Please refer to our prior cancer genetics clinic note for more information regarding Laura Li's medical, social and family histories, and our assessment and recommendations, at the time. Laura Li's genetic test results and recommendations warranted by these results were recently disclosed to her and are discussed in more detail below. ° °GENETIC TEST RESULTS: At the time of Laura Li's visit, we recommended she pursue genetic testing, which includes sequencing and deletion/duplication analysis of several genes associated with an increased risk for cancer via a gene panel. The OvaNext gene panel offered by [facility] includes sequencing and rearrangement analysis for the following 24 genes:ATM, BARD1, BRCA1, BRCA2, BRIP1, CDH1, CHEK2, EPCAM, MLH1, MRE11A, MSH2, MSH6, MUTYH, NBN, NF1, PALB2, PMS2, PTEN, RAD50, RAD51C, RAD51D, SMARCA4, STK11, and TP53.  ° °Genetic testing for this gene panel was normal and did not reveal a pathogenic mutation in any of these genes. A copy of the genetic test report will be scanned into Epic under the media tab. Genetic testing did identify two variants of uncertain significance called MLH1, c.-230G>C and NBN, p.T76N. At this time, it is  unknown if these variants are associated with an increased risk for cancer or if these are normal findings. With time, we suspect the lab will reclassify this variant and when they do, we will try to re-contact Laura Li to discuss the reclassification further.  ° °We discussed with Laura Li that current genetic testing is not perfect, and it is, therefore, possible there may be a pathogenic gene mutation in one of these genes that current testing cannot detect, but that chance is small.  We also discussed, that it is possible that another gene that has not yet been discovered, or that we have not yet tested, is responsible for the cancer diagnoses in her family. It is, therefore, important for Laura Li to continue to remain in touch with cancer genetics so that we can continue to offer Laura Li the most up to date genetic testing.  ° °CANCER SCREENING RECOMMENDATIONS: This result is reassuring and indicates that Laura Li likely does not have an increased risk for a future cancer due to a mutation in one of these genes. This normal test also suggests that Laura Li's cancers were most most likely not due to an inherited predisposition associated with one of these genes.  Most cancers happen by chance and this negative test suggests that her cancers fall into this category.  We, therefore, recommended she continue to follow the cancer management and screening guidelines provided by her oncology and primary healthcare providers.  ° °RECOMMENDATIONS FOR FAMILY MEMBERS: While these results are reassuring for Laura Li, this test does not tell us anything about Laura Li's maternal relatives' risks. We recommended further genetic testing in Laura Li's family as such testing might help us be even more confident in Laura Li's own negative results.   results. Genetic testing is best initiated in someone in the family who has had cancer at the youngest age.  In this family, Laura Li's maternal first cousin  with breast cancer diagnosed at age 76 would be the most informative person to test. Please let us know if we can help facilitate testing and/or a referral. Cancer genetic counselors can also be located, by visiting the website of the [employer] ([employer]ist[employer]se) and Field seismologist for a cancer Dietitian by zip code.  FOLLOW-UP: Lastly, we discussed with Laura Li that cancer genetics is a rapidly advancing field and it is likely that new genetic tests will be appropriate for her and/or family members in the future. We encouraged her to remain in contact with cancer genetics on an annual basis so we can update her personal and family histories and let her know of advances in cancer genetics that may benefit this family.   Our contact number was provided. Laura Li questions were answered to her satisfaction, and she knows she is welcome to call us at anytime with additional questions or concerns.    Catherine A. Fine, MS, CGC Certified Psychologist, [email or web address] Phone: [phone]

## 2014-05-30 NOTE — Patient Instructions (Signed)
Educated patient on lymphedema risk reduction practices including signs of infection, infection prevention, lymphedema signs and prevention and what to do in case of concerns of swelling.  Issued After Breast Cancer handouts and instructed patient with shoulder range of motion exercises typically given during the class.  Patient verbalized good understanding of all information.  Instructed patient on on the importance of a walking program and how to progress herself safely.  Instructed her on shoulder ROM exercises to be done throughout radiation to avoid limited mobility and she verbalized those and returned demonstration correctly.

## 2014-05-30 NOTE — Therapy (Signed)
[facility] [address] [place], [place], [address] Phone: [phone]   Fax:  [phone]  Physical Therapy Treatment / Discharge Summary  Patient Details  Name: Laura Li MRN: [id number] Date of Birth: 09-14-1940 Referring Provider:  Shelva Majestic, MD  Encounter Date: 05/30/2014      PT End of Session - 05/30/14 1604    Visit Number 2   Number of Visits 5   Date for PT Re-Evaluation 06/24/14   PT Start Time 1516   PT Stop Time 1600   PT Time Calculation (min) 44 min   Activity Tolerance Patient tolerated treatment well   Behavior During Therapy [facility] for tasks assessed/performed      Past Medical History  Diagnosis Date  . OA (osteoarthritis)   . History of DVT (deep vein thrombosis)   . Depression   . Hyperlipidemia   . Hypertension   . Hypothyroidism   . HX: breast cancer     melanoma  . Melanoma   . Eczema   . Arthritis   . Esophageal stricture   . GERD (gastroesophageal reflux disease)   . PONV (postoperative nausea and vomiting)   . Wears glasses     Past Surgical History  Procedure Laterality Date  . Appendectomy  1967  . Melanoma excision  2004    right side of face  . 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 Total abdominal hysterectomy  1992    including ovaries  . Tonsillectomy and adenoidectomy  1949  . Lipoma excision  1978    right breast  . Mastectomy  1998    right-nodes out  . Breast lumpectomy      left breast-benign  . Breast surgery  1977    removal of calcified milk gland right breast  . Colonoscopy    . Radioactive seed guided mastectomy with axillary sentinel lymph node biopsy Left 05/05/2014    Procedure: RADIOACTIVE SEED GUIDED LEFT LUMPECTOMY WITH AXILLARY SENTINEL LYMPH NODE BIOPSY;  Surgeon: Emelia Loron, MD;  Location: [facility];  Service: General;  Laterality: Left;    There were no vitals filed for this visit.  Visit Diagnosis:   At risk for lymphedema  Decreased range of motion of left shoulder      Subjective Assessment - 05/30/14 1524    Symptoms My left breast swelling seems to have resolved since last week.  No c/o pain today.  My arm is moving normally now.   Currently in Pain? No/denies                  Neldon Mc - 05/30/14 0001    Open a tight or new jar No difficulty   Do heavy household chores (wash walls, wash floors) No difficulty   Carry a shopping bag or briefcase No difficulty   Wash your back No difficulty   Use a knife to cut food No difficulty   Recreational activities in which you take some force or impact through your arm, shoulder, or hand (golf, hammering, tennis) No difficulty   During the past week, to what extent has your arm, shoulder or hand problem interfered with your normal social activities with family, friends, neighbors, or groups? Not at all   During the past week, to what extent has your arm, shoulder or hand problem limited your work or other regular daily activities Not at all   Arm, shoulder, or hand pain. None  Tingling (pins and needles) in your arm, shoulder, or hand None   Difficulty Sleeping No difficulty   DASH Score 0 %         Educated patient on lymphedema risk reduction practices including signs of infection, infection prevention, lymphedema  signs and prevention and what to do in case of concerns of swelling.  Issued After Breast Cancer handouts and instructed  patient with shoulder range of motion exercises typically given during the class.  Patient verbalized good understanding of  all information.  Instructed patient on on the importance of a walking program and how to progress herself safely.   Instructed her on shoulder ROM exercises to be done throughout radiation to avoid limited mobility and she verbalized  those and returned demonstration correctly.         PT Education - 05/30/14 1603    Education provided Yes   Education  Details Lymphedema risk reduction, how to be fitted for a compression sleeve, and HEP to be done to avoid losing shoulder ROM during radiation.   Person(s) Educated Patient   Methods Explanation;Handout   Comprehension Verbalized understanding;Returned demonstration           Short Term Clinic Goals - 05/24/14 1546    CC Short Term Goal  #1   Title short term goals=long term goals             Long Term Clinic Goals - 05/30/14 1614    CC Long Term Goal  #1   Title pt will verbalize an understanding of lymphedema risk reduction practices   Time 4   Period Weeks   Status Achieved   CC Long Term Goal  #2   Title pt will be independent in home exercise program for shoulder range of motion and strength   Time 4   Period Weeks   Status Achieved   CC Long Term Goal  #3   Title pt will report 50% less pain in left upper quadrant   Time 4   Period Weeks   Status Achieved   CC Long Term Goal  #4   Title pt will demonstrate ability to ambulate on treadmill for 10 minutes so she can easily transiition to community exercise program  Pt verbalized that she has started her walking program and is walking > 15 minutes at this time.   Time 4   Period Weeks   Status Achieved            Plan - 05/30/14 1612    Clinical Impression Statement Patient reports feeling like she needed the lymphedema prevention and risk reduction information but reports feeling that her arm is back to normal mobility and she no longer needs P.T.  She demostrated normal ROM when asked to show how much she can move her arm and PT agrees she is fine to be discharged.  Lymphedema risk reduction information was well received today and she verbalized good understanding.  She verbalized understanding to contact us during chemo or radiation if she had further needs for P.T.   PT Next Visit Plan Discharge patient due to goals met.   Consulted and Agree with Plan of Care Patient        Problem List Patient  Active Problem List   Diagnosis Date Noted  . Breast cancer 05/05/2014  . Bilateral breast cancer 04/29/2014  . Family history of breast cancer 04/29/2014  . Breast cancer, left breast 04/24/2014  . Breast cancer, right breast 04/24/2014  . GERD (gastroesophageal  reflux disease) 03/31/2014  . Hot flashes 03/31/2014  . Osteoarthritis 03/31/2014  . Melanoma of skin 03/31/2014  . Chronic kidney disease, stage III (moderate) 12/09/2012  . 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  . Vasculitis 11/09/2012  . DVT, HX OF 12/30/2006  . Hypothyroidism 02/06/2006  . Dyslipidemia 02/06/2006  . Essential hypertension 02/06/2006  . Allergic rhinitis 02/06/2006  . Osteopenia 02/06/2006    Asa Fath,MARTI COOPER, PT 05/30/2014, 4:16 PM  [place], [place], [address] Phone: [phone]   Fax:  [phone]   PHYSICAL THERAPY DISCHARGE SUMMARY  Visits from Start of Care: 2  Current functional level related to goals / functional outcomes: All goals met.  See goals listed above.   Remaining deficits: None   Education / Equipment: Recommended a 20-30 mmHg compression sleeve (bilaterally) for air travel.  Plan: Patient agrees to discharge.  Patient goals were partially met. Patient is being discharged due to meeting the stated rehab goals.  ?????       Annia Friendly, [place] 05/30/2014 4:18 PM

## 2014-06-01 ENCOUNTER — Ambulatory Visit (HOSPITAL_BASED_OUTPATIENT_CLINIC_OR_DEPARTMENT_OTHER): Payer: Medicare Other | Admitting: Oncology

## 2014-06-01 ENCOUNTER — Other Ambulatory Visit: Payer: Self-pay | Admitting: General Surgery

## 2014-06-01 VITALS — BP 132/76 | HR 67 | Temp 97.7°F | Resp 18 | Ht 66.0 in | Wt 146.3 lb

## 2014-06-01 DIAGNOSIS — Z171 Estrogen receptor negative status [ER-]: Secondary | ICD-10-CM | POA: Diagnosis not present

## 2014-06-01 DIAGNOSIS — D630 Anemia in neoplastic disease: Secondary | ICD-10-CM

## 2014-06-01 DIAGNOSIS — N183 Chronic kidney disease, stage 3 unspecified: Secondary | ICD-10-CM

## 2014-06-01 DIAGNOSIS — Z853 Personal history of malignant neoplasm of breast: Secondary | ICD-10-CM | POA: Diagnosis not present

## 2014-06-01 DIAGNOSIS — C50912 Malignant neoplasm of unspecified site of left female breast: Secondary | ICD-10-CM

## 2014-06-01 DIAGNOSIS — C439 Malignant melanoma of skin, unspecified: Secondary | ICD-10-CM

## 2014-06-01 DIAGNOSIS — I10 Essential (primary) hypertension: Secondary | ICD-10-CM

## 2014-06-01 DIAGNOSIS — C50112 Malignant neoplasm of central portion of left female breast: Secondary | ICD-10-CM

## 2014-06-01 DIAGNOSIS — C50911 Malignant neoplasm of unspecified site of right female breast: Secondary | ICD-10-CM

## 2014-06-01 NOTE — Progress Notes (Signed)
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 Non-of the 25 lymph nodes removed from the right axilla were involved. The tumor was estrogen and progesterone receptor positive, HER-2 not amplified. She was treated adjuvantly with doxorubicin and cyclophosphamide 4, then received 5 years of antiestrogen therapy (initially tamoxifen, later letrozole). There has been no evidence of disease recurrence on the right.  More recently, Laura Li had routine left screening [facility]. This showed a suspicious area of architectural distortion in the inferior portion of the left breast. On 00/17/4944 Laura Li underwent left diagnostic mammography with ultrasonography. Spot compression views confirmed the presence 7 area of distortion in the lower central left breast, which was not palpable by physical exam (there was a prior biopsy scar in the upper outer quadrant of the left breast). Ultrasound confirmed a hypoechoic spiculated mass at the 6:00 location in the left breast, measuring maximally 1.3 cm. Sonography of the left axilla was negative.  Biopsy of the left breast mass in question 120 10/29/2014 showed ([id number]) an invasive breast cancer with squamous features. It was estrogen and progesterone receptor negative. There was no HER-2 amplification, the signals ratio being 0.82 and the number per cell 1.80. The  MIB-1 was 10%.  The patient has met with Dr. Donne Hazel in surgery and Dr. Pablo Ledger in radiation oncology. The patient understands there is no survival difference between lumpectomy and radiation compared with mastectomy. The patient will need sentinel lymph node sampling with either of those procedures. If she does have a lumpectomy, she will benefit from adjuvant radiation. The patient has been set up for genetics testing and this may affect her ultimate surgical decision.  Her subsequent history is as detailed below  INTERVAL HISTORY: Laura Li returns today for follow-up of her breast cancer accompanied by her daughter Laura Li. Since her last visit here she had her definitive surgery, namely a left lumpectomy and sentinel lymph node sampling 05/05/2014. The final pathology ([id number]) showed an invasive ductal carcinoma, grade 1, measuring 1.7 cm. The single left axillary sentinel lymph node was negative. The prognostic panel was repeated and was again triple negative, with a HER-2 signals ratio of 1.13 and number per cell 1.80.--Because this was a stage I low-grade tumor in an elderly patient, we obtained a Mammaprint 2 see if it might possibly be "low-risk". However the Mammaprint came back high risk. It confirmed the triple negativity of the tumor and suggested a risk of recurrence within 10 years of 30% with local treatment all. Accordingly Laura Li is here to discuss chemotherapy options  REVIEW OF SYSTEMS: Laura Li did go and get herself a "sit bit" since her last visit and she is using it. 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 She tolerated the surgery with very little pain and she has an excellent range of motion in her left upper extremity. She's had no problems with bleeding or fever. Overall a detailed  review of systems was entirely stable  PAST MEDICAL HISTORY: Past Medical History  Diagnosis Date  . OA (osteoarthritis)   . History of DVT (deep vein  thrombosis)   . Depression   . Hyperlipidemia   . Hypertension   . Hypothyroidism   . HX: breast cancer     melanoma  . Melanoma   . Eczema   . Arthritis   . Esophageal stricture   . GERD (gastroesophageal reflux disease)   . PONV (postoperative nausea and vomiting)   . Wears glasses     PAST SURGICAL HISTORY: Past Surgical History  Procedure Laterality Date  . Appendectomy  1967  . Melanoma excision  2004    right side of face  . 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 Total abdominal hysterectomy  1992    including ovaries  . Tonsillectomy and adenoidectomy  1949  . Lipoma excision  1978    right breast  . Mastectomy  1998    right-nodes out  . Breast lumpectomy      left breast-benign  . Breast surgery  1977    removal of calcified milk gland right breast  . Colonoscopy    . Radioactive seed guided mastectomy with axillary sentinel lymph node biopsy Left 05/05/2014    Procedure: RADIOACTIVE SEED GUIDED LEFT LUMPECTOMY WITH AXILLARY SENTINEL LYMPH NODE BIOPSY;  Surgeon: Rolm Bookbinder, MD;  Location: [facility];  Service: General;  Laterality: Left;    FAMILY HISTORY Family History  Problem Relation Age of Onset  . Heart disease Father 83    smoker  . Endometrial cancer Mother 36  . Breast cancer Cousin     x 3 cousins with breast cancer (one at 53, one at 64 and one at 64)  . Lung cancer Paternal Aunt     smoker  . Brain cancer Maternal Aunt   . Colon cancer Neg Hx   . Diabetes Maternal Aunt     ???  . Breast cancer Maternal Aunt 51  The patient's father died at the age of 7 from a myocardial infarction. The patient's mother died at the age of 25 from metastatic endometrial cancer which had been diagnosed 2 years before. The patient had no brothers, one sister. The patient's mother's sister was diagnosed with breast cancer but the patient does not know at what age. A second maternal aunt was diagnosed with brain cancer. The patient  has 3 cousins on her mother's side diagnosed with breast cancer, one of them under the age of 41. On the father's side there is a history of lung and cervical cancer  GYNECOLOGIC HISTORY:  No LMP recorded. Patient has had a hysterectomy. Menarche age 53, first live birth age 44. The patient is GX P1. She underwent hysterectomy with bilateral salpingo-oophorectomy in 1991. She used hormone replacement for approximately 7 years until 1998, when she had her right-sided breast cancer. She used oral contraceptives for approximately one year with no complications. 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 She is widowed and lives alone with no pets. Her main hobby is painting. Her daughter Faith Rogue lives in [place] and is also a homemaker. The patient has 2 grandchildren. She attends wholly [place]. The patient's daughter Laura Li is her healthcare  power of attorney. Laura Li can be reached at [phone]   HEALTH MAINTENANCE: History  Substance Use Topics  . Smoking status: Former Smoker -- 0.75 packs/day for 3 years    Types: Cigarettes    Quit date: 03/11/1969  . Smokeless tobacco: Never Used  . Alcohol Use: 0.0 oz/week    0 Standard drinks or equivalent per week     Comment: occasional wine     Colonoscopy: 20/10/[facility]  YJE:HUDJSH post hysterectomy  Bone density:2014/osteopenia  Lipid panel:  Allergies  Allergen Reactions  . Dyazide [Hydrochlorothiazide W-Triamterene]     Presumption: drug induced vasculitis  . Niacin Other (See Comments)    flushing  . Penicillins Diarrhea and Nausea And Vomiting    Current Outpatient Prescriptions  Medication Sig Dispense Refill  . acetaminophen (TYLENOL) 500 MG tablet Take 500 mg by mouth 2 (two) times daily as needed for mild pain.     [name]  atorvastatin (LIPITOR) 20 MG tablet Take 1 tablet (20 mg total) by mouth daily. (Patient taking differently: 10 mg daily. ) 90 tablet 3  . B Complex-C-Folic Acid (STRESS FORMULA PO) Take 1 tablet by mouth daily.      . Calcium-Vitamin D (CALTRATE 600 PLUS-VIT D PO) Take 1 tablet by mouth 2 (two) times daily.     [name] esomeprazole (NEXIUM) 40 MG capsule TAKE 1 CAPSULE DAILY 90 capsule 3  . fexofenadine (ALLEGRA) 180 MG tablet Take 180 mg by mouth daily.    [name] HYDROcodone-acetaminophen (NORCO/VICODIN) 5-325 MG per tablet Take 1 tablet by mouth every 6 (six) hours as needed for moderate pain or severe pain. (Patient not taking: Reported on 05/24/2014) 20 tablet 0  . ipratropium (ATROVENT) 0.03 % nasal spray Place 2 sprays into the nose every 12 (twelve) hours. 180 mL 4  . Lactobacillus-Inulin ([place]) CHEW Chew by mouth.    . levothyroxine (SYNTHROID, LEVOTHROID) 75 MCG tablet Take 1 tablet (75 mcg total) by mouth daily. 90 tablet 2  . nadolol (CORGARD) 20 MG tablet Take 0.5 tablets (10 mg total) by mouth daily. 90 tablet 1  . Polyethyl Glycol-Propyl Glycol (SYSTANE) 0.4-0.3 % SOLN Apply 1 drop to eye 3 (three) times daily as needed (for dryness/itching).    . venlafaxine XR (EFFEXOR-XR) 75 MG 24 hr capsule Take 1 capsule (75 mg total) by mouth daily. 90 capsule 3   No current facility-administered medications for this visit.    OBJECTIVE: Older white woman who appears well Filed Vitals:   06/01/14 1207  BP: 132/76  Pulse: 67  Temp: 97.7 F (36.5 C)  Resp: 18     Body mass index is 23.62 kg/(m^2).    ECOG FS:0 - Asymptomatic  Sclerae unicteric, pupils equal and reactive Oropharynx clear and moist No cervical or supraclavicular adenopathy Lungs no rales or rhonchi Heart regular rate and rhythm Abd soft, nontender, positive bowel sounds MSK no focal spinal tenderness, no upper extremity lymphedema Neuro: nonfocal, well oriented, positive affect Breasts: The right breast  is status post mastectomy. There is no evidence of chest wall recurrence. The right axilla is benign. The left breast is status post recent lumpectomy. Incision is healing very nicely. The cosmetic result is excellent. The left axilla is benign.  LAB RESULTS:  CMP     Component Value Date/Time   NA 139 04/22/2014 1555   NA 137 12/21/2013 1004   K 5.1 04/22/2014 1555   K 4.4 12/21/2013 1004   CL 101 12/21/2013 1004   CO2 25  04/22/2014 1555   CO2 29 12/21/2013 1004   GLUCOSE 107 04/22/2014 1555   GLUCOSE 83 12/21/2013 1004   GLUCOSE 88 02/06/2006 1059   BUN 21.8 04/22/2014 1555   BUN 22 12/21/2013 1004   CREATININE 1.1 04/22/2014 1555   CREATININE 1.2 12/21/2013 1004   CALCIUM 9.4 04/22/2014 1555   CALCIUM 10.6* 12/21/2013 1004   PROT 7.6 04/22/2014 1555   PROT 8.5* 12/21/2013 1004   ALBUMIN 4.3 04/22/2014 1555   ALBUMIN 4.2 12/21/2013 1004   AST 20 04/22/2014 1555   AST 24 12/21/2013 1004   ALT 12 04/22/2014 1555   ALT 19 12/21/2013 1004   ALKPHOS 80 04/22/2014 1555   ALKPHOS 64 12/21/2013 1004   BILITOT 0.42 04/22/2014 1555   BILITOT 0.8 12/21/2013 1004   GFRNONAA 74.11 09/18/2009 1016   GFRAA 91 12/10/2007 1118    INo results found for: SPEP, UPEP  Lab Results  Component Value Date   WBC 5.8 04/22/2014   NEUTROABS 3.3 04/22/2014   HGB 12.0 04/22/2014   HCT 36.1 04/22/2014   MCV 91.2 04/22/2014   PLT 239 04/22/2014      Chemistry      Component Value Date/Time   NA 139 04/22/2014 1555   NA 137 12/21/2013 1004   K 5.1 04/22/2014 1555   K 4.4 12/21/2013 1004   CL 101 12/21/2013 1004   CO2 25 04/22/2014 1555   CO2 29 12/21/2013 1004   BUN 21.8 04/22/2014 1555   BUN 22 12/21/2013 1004   CREATININE 1.1 04/22/2014 1555   CREATININE 1.2 12/21/2013 1004      Component Value Date/Time   CALCIUM 9.4 04/22/2014 1555   CALCIUM 10.6* 12/21/2013 1004   ALKPHOS 80 04/22/2014 1555   ALKPHOS 64 12/21/2013 1004   AST 20 04/22/2014 1555   AST 24 12/21/2013 1004    ALT 12 04/22/2014 1555   ALT 19 12/21/2013 1004   BILITOT 0.42 04/22/2014 1555   BILITOT 0.8 12/21/2013 1004       No results found for: LABCA2  No components found for: LABCA125  No results for input(s): INR in the last 168 hours.  Urinalysis    Component Value Date/Time   COLORURINE YELLOW 05/17/2013 1330   APPEARANCEUR CLEAR 05/17/2013 1330   LABSPEC 1.025 05/17/2013 1330   PHURINE 5.5 05/17/2013 1330   GLUCOSEU NEGATIVE 05/17/2013 1330   HGBUR NEGATIVE 05/17/2013 1330   BILIRUBINUR n 06/21/2013 1115   BILIRUBINUR NEGATIVE 05/17/2013 1330   KETONESUR NEGATIVE 05/17/2013 1330   PROTEINUR n 06/21/2013 1115   PROTEINUR NEGATIVE 05/17/2013 1330   UROBILINOGEN 0.2 06/21/2013 1115   UROBILINOGEN 0.2 05/17/2013 1330   NITRITE n 06/21/2013 1115   NITRITE NEGATIVE 05/17/2013 1330   LEUKOCYTESUR Trace 06/21/2013 1115    STUDIES: Nm Sentinel Node Inj-no Rpt (breast)  05/05/2014   CLINICAL DATA: left axillary sentinel node biopsy   Sulfur colloid was injected intradermally by the nuclear medicine  technologist for breast cancer sentinel node localization.    Mm Breast Surgical Specimen  05/05/2014   CLINICAL DATA:  Left lumpectomy for breast cancer.  EXAM: SPECIMEN RADIOGRAPH OF THE LEFT BREAST  COMPARISON:  Previous exam(s).  FINDINGS: Status post excision of the left breast. The radioactive seed and biopsy marker clip are present, completely intact, and are marked for pathology.  IMPRESSION: Specimen radiograph of the left breast.   Electronically Signed   By: Claudie Revering M.D.   On: 05/05/2014 11:54   Mm Lt Radioactive Seed  Loc Mammo Guide  05/04/2014   CLINICAL DATA:  Left breast cancer 6 o'clock subareolar  EXAM: MAMMOGRAPHIC GUIDED RADIOACTIVE SEED LOCALIZATION OF THE LEFT BREAST  COMPARISON:  Previous exam(s).  FINDINGS: Patient presents for radioactive seed localization prior to lumpectomy. I met with the patient and we discussed the procedure of seed localization including  benefits and alternatives. We discussed the high likelihood of a successful procedure. We discussed the risks of the procedure including infection, bleeding, tissue injury and further surgery. We discussed the low dose of radioactivity involved in the procedure. Informed, written consent was given.  The usual time-out protocol was performed immediately prior to the procedure.  Using mammographic guidance, sterile technique, 2% lidocaine and an I-125 radioactive seed, the marker clip was localized using a craniocaudal approach. The follow-up mammogram images confirm the seed in the expected location and are marked for Dr. Donne Hazel.  Follow-up survey of the patient confirms presence of the radioactive seed.  Order number of I-125 seed:  [id number].  Total activity:  0.250 mCi  Reference Date: 04/1914  The patient tolerated the procedure well and was released from the [facility]. She was given instructions regarding seed removal.  IMPRESSION: Radioactive seed localization left breast. No apparent complications.   Electronically Signed   By: Skipper Cliche M.D.   On: 05/04/2014 12:04    ASSESSMENT: 74 y.o. [place] woman  (1) status post left modified radical mastectomy November 1998 for a pT2 pN0, stage IIA invasive ductal carcinoma, grade 2, estrogen and progesterone receptor positive, HER-2 not amplified  (a) adjuvant chemotherapy consisted of doxorubicin and cyclophosphamide 4  (b) adjuvant anti-estrogens consisted of tamoxifen, then letrozole, for a total of 5 years  (2) status post left breast lower outer quadrant biopsy 04/07/2014 for a clinical T1c N0,stage IA  invasive ductal carcinoma, with squamous features, estrogen and progesterone receptor negative, HER-2 not amplified, with an MIB-1 of 10%   (3) left lumpectomy and sentinel lymph node biopsy 05/05/2014 showed aT1c pN0, stage IA invasive ductal carcinoma, grade 1, triple negative, with close but negative margins.   (4) Mammaprint  classifies the tumor as basal like, high risk, and predicts a 30% chance of recurrence within 10 years with local treatment only.   (5) adjuvant chemotherapy starting 06/20/2014 to consist of carboplatin and docetaxel given every 21 days 4 with Neulasta support.  (6) genetics testing March 2016 showed no deleterious mutation in the [employer] [ [employer]] including sequencing and rearrangement analysis for the following 24 genes:ATM, BARD1, BRCA1, BRCA2, BRIP1, CDH1, CHEK2, EPCAM, MLH1, MRE11A, MSH2, MSH6, MUTYH, NBN, NF1, PALB2, PMS2, PTEN, RAD50, RAD51C, RAD51D, SMARCA4, STK11, and TP53.   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    PLAN: I discussed the genetics results with Amerah. She understands she does not carry a deleterious mutation that we can identify. There is therefore no need to test other family members. She does carry a variant of uncertain significance. This is something we will continue to follow and if this is reclassified as benign or deleterious at some point in the future we will let her know.  We then went over her Mammaprint. This shows a basal like tumor which is high risk. It predicts a 30% chance of recurrence within 10 years if all she does is local treatment. Accordingly one would anticipate a benefit of chemotherapy in the 10-12% range.  We then discussed the possible toxicities, side effects and complications of  carboplatin and docetaxel, which is what I would use given the fact that she received Cytoxan and Adriamycin previously. If she does not tolerate this combination, we can consider weekly carboplatin with weekly paclitaxel days one and day 8 of each 21 day cycle, or we can switch to CMF.  She will need a port and that will be scheduled for the first week in April. We are planning to start chemotherapy April 11. She will see me before that date to go over her antinausea and other supportive  medications.  The patient has a good understanding of the overall plan. She agrees with it. She knows the goal of treatment in her case is cure. She will call with any problems that may develop before her next visit here.  Chauncey Cruel, MD   06/01/2014 12:21 PM Medical Oncology and Hematology [facility] [address] [place], [place] [address] Tel. [phone]    Fax. [phone]

## 2014-06-02 ENCOUNTER — Telehealth: Payer: Self-pay | Admitting: *Deleted

## 2014-06-02 ENCOUNTER — Telehealth: Payer: Self-pay | Admitting: Oncology

## 2014-06-02 NOTE — Telephone Encounter (Signed)
Per staff message and POF I have scheduled appts. Advised scheduler of appts. JMW  

## 2014-06-02 NOTE — Telephone Encounter (Signed)
per pof to sch pt appt-left pt a message of time & date of chemo ed class-adv to get updated copy of sch that day

## 2014-06-06 ENCOUNTER — Encounter: Payer: Medicare Other | Admitting: Physical Therapy

## 2014-06-07 ENCOUNTER — Other Ambulatory Visit: Payer: Medicare Other

## 2014-06-07 ENCOUNTER — Encounter: Payer: Self-pay | Admitting: *Deleted

## 2014-06-07 ENCOUNTER — Telehealth: Payer: Self-pay | Admitting: *Deleted

## 2014-06-07 NOTE — Telephone Encounter (Signed)
No additional note

## 2014-06-13 ENCOUNTER — Encounter: Payer: Self-pay | Admitting: Oncology

## 2014-06-13 ENCOUNTER — Encounter: Payer: Medicare Other | Admitting: Physical Therapy

## 2014-06-13 ENCOUNTER — Other Ambulatory Visit (HOSPITAL_COMMUNITY): Payer: Medicare Other

## 2014-06-14 ENCOUNTER — Telehealth: Payer: Self-pay | Admitting: *Deleted

## 2014-06-14 ENCOUNTER — Other Ambulatory Visit: Payer: Medicare Other

## 2014-06-14 ENCOUNTER — Encounter: Payer: Self-pay | Admitting: *Deleted

## 2014-06-14 ENCOUNTER — Encounter (HOSPITAL_COMMUNITY)
Admission: RE | Admit: 2014-06-14 | Discharge: 2014-06-14 | Disposition: A | Discharge status: Home / Self Care | Payer: Medicare Other | Source: Ambulatory Visit | Attending: General Surgery | Admitting: General Surgery

## 2014-06-14 ENCOUNTER — Encounter (HOSPITAL_COMMUNITY): Payer: Self-pay

## 2014-06-14 DIAGNOSIS — Z01812 Encounter for preprocedural laboratory examination: Secondary | ICD-10-CM | POA: Insufficient documentation

## 2014-06-14 HISTORY — DX: Anemia, unspecified: D64.9

## 2014-06-14 HISTORY — DX: Chronic kidney disease, unspecified: N18.9

## 2014-06-14 HISTORY — DX: Cardiac murmur, unspecified: R01.1

## 2014-06-14 HISTORY — DX: Headache: R51

## 2014-06-14 HISTORY — DX: Headache, unspecified: R51.9

## 2014-06-14 LAB — BASIC METABOLIC PANEL
ANION GAP: 5 (ref 5–15)
BUN: 16 mg/dL (ref 6–23)
CALCIUM: 9.9 mg/dL (ref 8.4–10.5)
CO2: 33 mmol/L — ABNORMAL HIGH (ref 19–32)
Chloride: 100 mmol/L (ref 96–112)
Creatinine, Ser: 0.97 mg/dL (ref 0.50–1.10)
GFR calc Af Amer: 65 mL/min — ABNORMAL LOW (ref >90)
GFR calc non Af Amer: 56 mL/min — ABNORMAL LOW (ref >90)
Glucose, Bld: 79 mg/dL (ref 70–99)
POTASSIUM: 4.1 mmol/L (ref 3.5–5.1)
SODIUM: 138 mmol/L (ref 135–145)

## 2014-06-14 LAB — CBC
HCT: 37.5 % (ref 36.0–46.0)
Hemoglobin: 12.5 g/dL (ref 12.0–15.0)
MCH: 29.9 pg (ref 26.0–34.0)
MCHC: 33.3 g/dL (ref 30.0–36.0)
MCV: 89.7 fL (ref 78.0–100.0)
Platelets: 228 10*3/uL (ref 150–400)
RBC: 4.18 MIL/uL (ref 3.87–5.11)
RDW: 13 % (ref 11.5–15.5)
WBC: 8.5 10*3/uL (ref 4.0–10.5)

## 2014-06-14 MED ORDER — CIPROFLOXACIN IN D5W 400 MG/200ML IV SOLN
400.0000 mg | INTRAVENOUS | Status: AC
  Administered 2014-06-15: 400 mg via INTRAVENOUS
  Filled 2014-06-14: qty 200

## 2014-06-14 NOTE — Progress Notes (Signed)
PCP is Dr hunter Denies seeing a cardiologist.  Denies ever having a card cath, or echo States she may have had a stress test many years ago, but can't remember when or where.

## 2014-06-14 NOTE — Pre-Procedure Instructions (Signed)
Laura Li  [birth year]   Your procedure is scheduled on:  April 6  Report to [facility] Admitting at 930 AM.  Call this number if you have problems the morning of surgery: [phone]   Remember:   Do not eat food or drink liquids after midnight.   Take these medicines the morning of surgery with A SIP OF WATER: tylenol if needed,eye drops if needed, esomeprazole (Nexium), Fexofenasine (allegra)if needed,  Hydrocodone(Norco) if needed, levothyroxine (synthroid), nadolol ([place]ard), Venlafaxine (Effexor) Stop taking aspirin, aleve, ibuprofen, BC's, Goody's, Herbal medications, and Fish Oil   Do not wear jewelry, make-up or nail polish.  Do not wear lotions, powders, or perfumes. You may wear deodorant.  Do not shave 48 hours prior to surgery. Men may shave face and neck.  Do not bring valuables to the hospital.  [facility] is not responsible for any belongings or valuables.               Contacts, dentures or bridgework may not be worn into surgery.  Leave suitcase in the car. After surgery it may be brought to your room.  For patients admitted to the hospital, discharge time is determined by your  treatment team.               Patients discharged the day of surgery will not be allowed to drive home.    Special Instructions: Laura Li - Preparing for Surgery  Before surgery, you can play an important role.  Because skin is not sterile, your skin needs to be as free of germs as possible.  You can reduce the number of germs on you skin by washing with CHG (chlorahexidine gluconate) soap before surgery.  CHG is an antiseptic cleaner which kills germs and bonds with the skin to continue killing germs even after washing.  Please DO NOT use if you have an allergy to CHG or antibacterial soaps.  If your skin becomes reddened/irritated stop using the CHG and inform your nurse when you arrive at Short Stay.  Do not shave (including legs and underarms) for at least 48 hours  prior to the first CHG shower.  You may shave your face.  Please follow these instructions carefully:   1.  Shower with CHG Soap the night before surgery and the  morning of Surgery.  2.  If you choose to wash your hair, wash your hair first as usual with your   normal shampoo.  3.  After you shampoo, rinse your hair and body thoroughly to remove the   Shampoo.  4.  Use CHG as you would any other liquid soap.  You can apply chg directly  to the skin and wash gently with scrungie or a clean washcloth.  5.  Apply the CHG Soap to your body ONLY FROM THE NECK DOWN.   Do not use on open wounds or open sores.  Avoid contact with your eyes,   ears, mouth and genitals (private parts).  Wash genitals (private parts)  with your normal soap.  6.  Wash thoroughly, paying special attention to the area where your surgery  will be performed.  7.  Thoroughly rinse your body with warm water from the neck down.  8.  DO NOT shower/wash with your normal soap after using and rinsing off the CHG Soap.  9.  Pat yourself dry with a clean towel.            10.  Wear clean pajamas.  11.  Place clean sheets on your bed the night of your first shower and do not sleep with pets.  Day of Surgery  Do not apply any lotions/deoderants the morning of surgery.  Please wear clean clothes to the hospital/surgery center.      Please read over the following fact sheets that you were given: Pain Booklet, Coughing and Deep Breathing and Surgical Site Infection Prevention

## 2014-06-14 NOTE — Telephone Encounter (Signed)
This RN returned call to pt per her message stating concern about appointment late tomorrow " and Dr Donne Hazel will be putting the port in earlier that day "  Per review with MD this RN informed pt appointment is needed to review upcoming chemo regimen as well as obtaining prescriptions for symptom management. Appointment can be rescheduled to Friday with midlevel.  Per discussion pt would prefer to r/s to this Friday.  Appointment change made by this RN.

## 2014-06-14 NOTE — Anesthesia Preprocedure Evaluation (Addendum)
Anesthesia Evaluation  Patient identified by MRN, date of birth, ID band Patient awake    Reviewed: Allergy & Precautions, NPO status , Patient's Chart, lab work & pertinent test results, reviewed documented beta blocker date and time   History of Anesthesia Complications (+) PONV  Airway Mallampati: II   Neck ROM: Full    Dental  (+) Teeth Intact, Dental Advisory Given   Pulmonary former smoker (quit 1971),  breath sounds clear to auscultation        Cardiovascular hypertension, Pt. on medications Rhythm:Regular     Neuro/Psych Depression    GI/Hepatic GERD-  Medicated,  Endo/Other    Renal/GU      Musculoskeletal   Abdominal (+)  Abdomen: soft.    Peds  Hematology 12/37   Anesthesia Other Findings   Reproductive/Obstetrics                            Anesthesia Physical Anesthesia Plan  ASA: II  Anesthesia Plan: General   Post-op Pain Management:    Induction: Intravenous  Airway Management Planned: LMA  Additional Equipment:   Intra-op Plan:   Post-operative Plan:   Informed Consent: I have reviewed the patients History and Physical, chart, labs and discussed the procedure including the risks, benefits and alternatives for the proposed anesthesia with the patient or authorized representative who has indicated his/her understanding and acceptance.     Plan Discussed with:   Anesthesia Plan Comments:         Anesthesia Quick Evaluation

## 2014-06-14 NOTE — Progress Notes (Signed)
Anesthesia Chart Review:  Patient is a 74 year old female scheduled for Port-a-cath insertion tomorrow with Dr. Aura Fey.  History includes former smoker, post-operative N/V, melanoma excision '04, HTN, HLD, DVT, hypothyroidism, GERD, remote history of "benign" murmur, depression, anemia, CKD, breast cancer s/p right modified radical mastectomy '98 with chemotherapy and s/p left breast radioactive seed implant with lumpectomy and axillary LN biopsy on 05/05/14 (pathology invasive grade I ductal carcinoma, benign sentinel node biopsy). She is scheduled to start adjuvant chemotherapy 06/20/14. PCP is Dr. [name].  HEM-ONC is Dr. Jana Hakim.  PAT VITALS: BP 151/97, HR 81, RR 18, 100% O2 sat, T 36.8C.  Meds include Lipitor, Nexium, Allegra, Atrovent, levothyroxine, Corgard, Effexor XR.   05/04/14 EKG: SB, LAD, LVH. Reported prior stress test, but thinks it was > 5 years ago.  Preoperative labs noted.    She tolerated surgery within the past two months.  BP will be rechecked on arrival tomorrow.  If no acute changes then I anticipate that she can proceed as planned.  [facility]/Anesthesiology Phone [phone] 06/14/2014 4:38 PM

## 2014-06-15 ENCOUNTER — Ambulatory Visit (HOSPITAL_COMMUNITY): Payer: Medicare Other | Admitting: Vascular Surgery

## 2014-06-15 ENCOUNTER — Ambulatory Visit (HOSPITAL_COMMUNITY): Payer: Medicare Other

## 2014-06-15 ENCOUNTER — Observation Stay (HOSPITAL_COMMUNITY): Payer: Medicare Other

## 2014-06-15 ENCOUNTER — Encounter (HOSPITAL_COMMUNITY)
Admission: RE | Disposition: A | Discharge status: Home / Self Care | Payer: Self-pay | Source: Ambulatory Visit | Attending: General Surgery

## 2014-06-15 ENCOUNTER — Encounter (HOSPITAL_COMMUNITY): Payer: Self-pay | Admitting: *Deleted

## 2014-06-15 ENCOUNTER — Observation Stay (HOSPITAL_COMMUNITY): Payer: Medicare Other | Admitting: Anesthesiology

## 2014-06-15 ENCOUNTER — Ambulatory Visit (HOSPITAL_COMMUNITY): Payer: Medicare Other | Admitting: Anesthesiology

## 2014-06-15 ENCOUNTER — Inpatient Hospital Stay (HOSPITAL_COMMUNITY)
Admission: RE | Admit: 2014-06-15 | Discharge: 2014-06-22 | DRG: 200 | Disposition: A | Discharge status: Home / Self Care | Payer: Medicare Other | Source: Ambulatory Visit | Attending: General Surgery | Admitting: General Surgery

## 2014-06-15 ENCOUNTER — Ambulatory Visit: Payer: Medicare Other | Admitting: Oncology

## 2014-06-15 DIAGNOSIS — C50912 Malignant neoplasm of unspecified site of left female breast: Secondary | ICD-10-CM | POA: Diagnosis not present

## 2014-06-15 DIAGNOSIS — Z4682 Encounter for fitting and adjustment of non-vascular catheter: Secondary | ICD-10-CM

## 2014-06-15 DIAGNOSIS — Z880 Allergy status to penicillin: Secondary | ICD-10-CM

## 2014-06-15 DIAGNOSIS — T85628A Displacement of other specified internal prosthetic devices, implants and grafts, initial encounter: Secondary | ICD-10-CM | POA: Diagnosis not present

## 2014-06-15 DIAGNOSIS — Z95828 Presence of other vascular implants and grafts: Secondary | ICD-10-CM

## 2014-06-15 DIAGNOSIS — J939 Pneumothorax, unspecified: Secondary | ICD-10-CM

## 2014-06-15 DIAGNOSIS — J95811 Postprocedural pneumothorax: Secondary | ICD-10-CM | POA: Diagnosis not present

## 2014-06-15 DIAGNOSIS — Z79899 Other long term (current) drug therapy: Secondary | ICD-10-CM

## 2014-06-15 DIAGNOSIS — C50919 Malignant neoplasm of unspecified site of unspecified female breast: Secondary | ICD-10-CM | POA: Diagnosis present

## 2014-06-15 DIAGNOSIS — Z888 Allergy status to other drugs, medicaments and biological substances status: Secondary | ICD-10-CM

## 2014-06-15 DIAGNOSIS — I517 Cardiomegaly: Secondary | ICD-10-CM | POA: Diagnosis not present

## 2014-06-15 DIAGNOSIS — Z87891 Personal history of nicotine dependence: Secondary | ICD-10-CM | POA: Diagnosis not present

## 2014-06-15 DIAGNOSIS — Z8709 Personal history of other diseases of the respiratory system: Secondary | ICD-10-CM | POA: Diagnosis present

## 2014-06-15 DIAGNOSIS — Z9689 Presence of other specified functional implants: Secondary | ICD-10-CM

## 2014-06-15 HISTORY — PX: PORTACATH PLACEMENT: SHX2246

## 2014-06-15 HISTORY — DX: Pneumothorax, unspecified: J93.9

## 2014-06-15 HISTORY — PX: CHEST TUBE INSERTION: SHX231

## 2014-06-15 SURGERY — CHEST TUBE INSERTION
Anesthesia: Monitor Anesthesia Care | Site: Chest | Laterality: Bilateral

## 2014-06-15 SURGERY — INSERTION, TUNNELED CENTRAL VENOUS DEVICE, WITH PORT
Anesthesia: General | Laterality: Left

## 2014-06-15 MED ORDER — SODIUM CHLORIDE 0.9 % IV SOLN
INTRAVENOUS | Status: DC
Start: 2014-06-15 — End: 2014-06-17
  Administered 2014-06-16: via INTRAVENOUS

## 2014-06-15 MED ORDER — OXYCODONE HCL 5 MG PO TABS
ORAL_TABLET | ORAL | Status: AC
  Administered 2014-06-15: 5 mg via ORAL
  Filled 2014-06-15: qty 1

## 2014-06-15 MED ORDER — FENTANYL CITRATE 0.05 MG/ML IJ SOLN
25.0000 ug | INTRAMUSCULAR | Status: DC | PRN
  Administered 2014-06-15: 50 ug via INTRAVENOUS

## 2014-06-15 MED ORDER — SODIUM CHLORIDE 0.9 % IV SOLN: 250.0000 mL | INTRAVENOUS | Status: DC | PRN

## 2014-06-15 MED ORDER — BUPIVACAINE HCL (PF) 0.25 % IJ SOLN
INTRAMUSCULAR | Status: DC | PRN
  Administered 2014-06-15: 17 mL

## 2014-06-15 MED ORDER — SODIUM CHLORIDE 0.9 % IJ SOLN: 3.0000 mL | INTRAMUSCULAR | Status: DC | PRN

## 2014-06-15 MED ORDER — OXYCODONE HCL 5 MG PO TABS
5.0000 mg | ORAL_TABLET | Freq: Once | ORAL | Status: AC | PRN
  Administered 2014-06-15: 5 mg via ORAL

## 2014-06-15 MED ORDER — SCOPOLAMINE 1 MG/3DAYS TD PT72
MEDICATED_PATCH | TRANSDERMAL | Status: AC
Start: 2014-06-15 — End: 2014-06-18
  Administered 2014-06-15: 1.5 mg via TRANSDERMAL
  Filled 2014-06-15: qty 1

## 2014-06-15 MED ORDER — MORPHINE SULFATE 2 MG/ML IJ SOLN
2.0000 mg | INTRAMUSCULAR | Status: DC | PRN
  Administered 2014-06-15: 2 mg via INTRAVENOUS

## 2014-06-15 MED ORDER — DEXAMETHASONE SODIUM PHOSPHATE 4 MG/ML IJ SOLN
INTRAMUSCULAR | Status: DC | PRN
  Administered 2014-06-15: 4 mg via INTRAVENOUS

## 2014-06-15 MED ORDER — FENTANYL CITRATE 0.05 MG/ML IJ SOLN
INTRAMUSCULAR | Status: AC
  Filled 2014-06-15: qty 2

## 2014-06-15 MED ORDER — ARTIFICIAL TEARS OP OINT
TOPICAL_OINTMENT | OPHTHALMIC | Status: DC | PRN
  Administered 2014-06-15: 1 via OPHTHALMIC

## 2014-06-15 MED ORDER — MEPERIDINE HCL 25 MG/ML IJ SOLN: 6.2500 mg | INTRAMUSCULAR | Status: DC | PRN

## 2014-06-15 MED ORDER — OXYCODONE HCL 5 MG PO TABS
ORAL_TABLET | ORAL | Status: AC
  Filled 2014-06-15: qty 1

## 2014-06-15 MED ORDER — FENTANYL CITRATE 0.05 MG/ML IJ SOLN
25.0000 ug | INTRAMUSCULAR | Status: AC | PRN
  Administered 2014-06-15 (×6): 25 ug via INTRAVENOUS

## 2014-06-15 MED ORDER — PANTOPRAZOLE SODIUM 40 MG PO TBEC
40.0000 mg | DELAYED_RELEASE_TABLET | Freq: Every day | ORAL | Status: DC
  Administered 2014-06-16 – 2014-06-22 (×7): 40 mg via ORAL
  Filled 2014-06-15 (×7): qty 1

## 2014-06-15 MED ORDER — MIDAZOLAM HCL 5 MG/5ML IJ SOLN
INTRAMUSCULAR | Status: DC | PRN
  Administered 2014-06-15 (×2): 1 mg via INTRAVENOUS

## 2014-06-15 MED ORDER — BUPIVACAINE HCL (PF) 0.25 % IJ SOLN
INTRAMUSCULAR | Status: AC
  Filled 2014-06-15: qty 30

## 2014-06-15 MED ORDER — HEPARIN SOD (PORK) LOCK FLUSH 100 UNIT/ML IV SOLN
INTRAVENOUS | Status: AC
  Filled 2014-06-15: qty 5

## 2014-06-15 MED ORDER — ARTIFICIAL TEARS OP OINT
TOPICAL_OINTMENT | OPHTHALMIC | Status: AC
  Filled 2014-06-15: qty 3.5

## 2014-06-15 MED ORDER — VENLAFAXINE HCL ER 75 MG PO CP24
75.0000 mg | ORAL_CAPSULE | Freq: Every day | ORAL | Status: DC
  Administered 2014-06-16 – 2014-06-22 (×7): 75 mg via ORAL
  Filled 2014-06-15 (×7): qty 1

## 2014-06-15 MED ORDER — EPHEDRINE SULFATE 50 MG/ML IJ SOLN
INTRAMUSCULAR | Status: AC
  Filled 2014-06-15: qty 1

## 2014-06-15 MED ORDER — OXYCODONE HCL 5 MG/5ML PO SOLN: 5.0000 mg | Freq: Once | ORAL | Status: AC | PRN

## 2014-06-15 MED ORDER — PROPOFOL 10 MG/ML IV BOLUS
INTRAVENOUS | Status: AC
  Filled 2014-06-15: qty 20

## 2014-06-15 MED ORDER — SODIUM CHLORIDE 0.9 % IJ SOLN
3.0000 mL | Freq: Two times a day (BID) | INTRAMUSCULAR | Status: DC
  Administered 2014-06-16: 3 mL via INTRAVENOUS

## 2014-06-15 MED ORDER — SODIUM CHLORIDE 0.9 % IJ SOLN
INTRAMUSCULAR | Status: AC
  Filled 2014-06-15: qty 10

## 2014-06-15 MED ORDER — HEPARIN SOD (PORK) LOCK FLUSH 100 UNIT/ML IV SOLN
INTRAVENOUS | Status: DC | PRN
  Administered 2014-06-15: 500 [IU] via INTRAVENOUS

## 2014-06-15 MED ORDER — DEXAMETHASONE SODIUM PHOSPHATE 4 MG/ML IJ SOLN
INTRAMUSCULAR | Status: AC
  Filled 2014-06-15: qty 1

## 2014-06-15 MED ORDER — LORATADINE 10 MG PO TABS
10.0000 mg | ORAL_TABLET | Freq: Every day | ORAL | Status: DC
  Administered 2014-06-16 – 2014-06-22 (×7): 10 mg via ORAL
  Filled 2014-06-15 (×7): qty 1

## 2014-06-15 MED ORDER — PROMETHAZINE HCL 25 MG/ML IJ SOLN: 6.2500 mg | INTRAMUSCULAR | Status: DC | PRN

## 2014-06-15 MED ORDER — LACTATED RINGERS IV SOLN
INTRAVENOUS | Status: DC | PRN
  Administered 2014-06-15: 20:00:00 via INTRAVENOUS

## 2014-06-15 MED ORDER — LIDOCAINE HCL (CARDIAC) 10 MG/ML IV SOLN
INTRAVENOUS | Status: DC | PRN
  Administered 2014-06-15: 60 mg via INTRAVENOUS

## 2014-06-15 MED ORDER — ACETAMINOPHEN 650 MG RE SUPP: 650.0000 mg | RECTAL | Status: DC | PRN

## 2014-06-15 MED ORDER — MORPHINE SULFATE 2 MG/ML IJ SOLN
INTRAMUSCULAR | Status: AC
  Administered 2014-06-15: 2 mg via INTRAVENOUS
  Filled 2014-06-15: qty 1

## 2014-06-15 MED ORDER — SCOPOLAMINE 1 MG/3DAYS TD PT72
1.0000 | MEDICATED_PATCH | TRANSDERMAL | Status: DC
  Administered 2014-06-15: 1.5 mg via TRANSDERMAL

## 2014-06-15 MED ORDER — LEVOTHYROXINE SODIUM 50 MCG PO TABS
75.0000 ug | ORAL_TABLET | Freq: Every day | ORAL | Status: DC
  Administered 2014-06-16 – 2014-06-22 (×7): 75 ug via ORAL
  Filled 2014-06-15 (×15): qty 1

## 2014-06-15 MED ORDER — FENTANYL CITRATE 0.05 MG/ML IJ SOLN
INTRAMUSCULAR | Status: DC | PRN
  Administered 2014-06-15: 50 ug via INTRAVENOUS

## 2014-06-15 MED ORDER — FENTANYL CITRATE 0.05 MG/ML IJ SOLN
INTRAMUSCULAR | Status: DC | PRN
  Administered 2014-06-15 (×2): 50 ug via INTRAVENOUS

## 2014-06-15 MED ORDER — ONDANSETRON HCL 4 MG/2ML IJ SOLN: 4.0000 mg | Freq: Four times a day (QID) | INTRAMUSCULAR | Status: DC | PRN

## 2014-06-15 MED ORDER — LIDOCAINE-EPINEPHRINE 1 %-1:100000 IJ SOLN
INTRAMUSCULAR | Status: DC | PRN
  Administered 2014-06-15: 15 mL via INTRADERMAL

## 2014-06-15 MED ORDER — SODIUM CHLORIDE 0.9 % IV SOLN
INTRAVENOUS | Status: DC
  Administered 2014-06-15: 19:00:00 via INTRAVENOUS

## 2014-06-15 MED ORDER — OXYCODONE HCL 5 MG PO TABS
5.0000 mg | ORAL_TABLET | ORAL | Status: DC | PRN
  Administered 2014-06-16 – 2014-06-21 (×13): 5 mg via ORAL
  Filled 2014-06-15 (×14): qty 1

## 2014-06-15 MED ORDER — MIDAZOLAM HCL 2 MG/2ML IJ SOLN
INTRAMUSCULAR | Status: AC
  Filled 2014-06-15: qty 2

## 2014-06-15 MED ORDER — LIDOCAINE HCL (CARDIAC) 20 MG/ML IV SOLN
INTRAVENOUS | Status: AC
  Filled 2014-06-15: qty 5

## 2014-06-15 MED ORDER — SODIUM CHLORIDE 0.9 % IR SOLN
Status: DC | PRN
  Administered 2014-06-15: 12:00:00

## 2014-06-15 MED ORDER — 0.9 % SODIUM CHLORIDE (POUR BTL) OPTIME
TOPICAL | Status: DC | PRN
  Administered 2014-06-15: 1000 mL

## 2014-06-15 MED ORDER — KETOROLAC TROMETHAMINE 15 MG/ML IJ SOLN
15.0000 mg | Freq: Three times a day (TID) | INTRAMUSCULAR | Status: DC | PRN
  Administered 2014-06-16 (×2): 15 mg via INTRAVENOUS
  Filled 2014-06-15 (×2): qty 1

## 2014-06-15 MED ORDER — LIDOCAINE-EPINEPHRINE 1 %-1:100000 IJ SOLN
INTRAMUSCULAR | Status: AC
  Filled 2014-06-15: qty 1

## 2014-06-15 MED ORDER — ACETAMINOPHEN 325 MG PO TABS: 650.0000 mg | ORAL_TABLET | ORAL | Status: DC | PRN

## 2014-06-15 MED ORDER — LACTATED RINGERS IV SOLN
INTRAVENOUS | Status: DC | PRN
  Administered 2014-06-15: 12:00:00 via INTRAVENOUS

## 2014-06-15 MED ORDER — ACETAMINOPHEN 325 MG PO TABS
650.0000 mg | ORAL_TABLET | Freq: Four times a day (QID) | ORAL | Status: DC | PRN
  Administered 2014-06-19 (×3): 650 mg via ORAL
  Filled 2014-06-15 (×3): qty 2

## 2014-06-15 MED ORDER — NADOLOL 20 MG PO TABS
10.0000 mg | ORAL_TABLET | Freq: Every day | ORAL | Status: DC
  Administered 2014-06-16 – 2014-06-22 (×7): 10 mg via ORAL
  Filled 2014-06-15 (×7): qty 1

## 2014-06-15 MED ORDER — MORPHINE SULFATE 2 MG/ML IJ SOLN
1.0000 mg | INTRAMUSCULAR | Status: DC | PRN
  Administered 2014-06-15 – 2014-06-19 (×5): 1 mg via INTRAVENOUS
  Filled 2014-06-15 (×5): qty 1

## 2014-06-15 MED ORDER — PROPOFOL 10 MG/ML IV BOLUS
INTRAVENOUS | Status: DC | PRN
  Administered 2014-06-15: 150 mg via INTRAVENOUS

## 2014-06-15 MED ORDER — BUPIVACAINE HCL (PF) 0.25 % IJ SOLN
INTRAMUSCULAR | Status: DC | PRN
  Administered 2014-06-15: 15 mL

## 2014-06-15 MED ORDER — ACETAMINOPHEN 500 MG PO TABS
1000.0000 mg | ORAL_TABLET | Freq: Four times a day (QID) | ORAL | Status: DC
  Administered 2014-06-16 (×2): 1000 mg via ORAL
  Filled 2014-06-15 (×2): qty 2

## 2014-06-15 MED ORDER — MIDAZOLAM HCL 5 MG/5ML IJ SOLN
INTRAMUSCULAR | Status: DC | PRN
  Administered 2014-06-15: 2 mg via INTRAVENOUS

## 2014-06-15 MED ORDER — FENTANYL CITRATE 0.05 MG/ML IJ SOLN
INTRAMUSCULAR | Status: AC
  Filled 2014-06-15: qty 5

## 2014-06-15 MED ORDER — ONDANSETRON HCL 4 MG/2ML IJ SOLN
INTRAMUSCULAR | Status: DC | PRN
  Administered 2014-06-15: 4 mg via INTRAVENOUS

## 2014-06-15 MED ORDER — OXYCODONE HCL 5 MG PO TABS
5.0000 mg | ORAL_TABLET | ORAL | Status: DC | PRN
  Administered 2014-06-15 – 2014-06-17 (×5): 5 mg via ORAL
  Filled 2014-06-15 (×2): qty 1
  Filled 2014-06-15 (×2): qty 2

## 2014-06-15 MED ORDER — ACETAMINOPHEN 650 MG RE SUPP: 650.0000 mg | Freq: Four times a day (QID) | RECTAL | Status: DC | PRN

## 2014-06-15 MED ORDER — LACTATED RINGERS IV SOLN
INTRAVENOUS | Status: DC
  Administered 2014-06-15: 10:00:00 via INTRAVENOUS

## 2014-06-15 MED ORDER — ONDANSETRON HCL 4 MG/2ML IJ SOLN
INTRAMUSCULAR | Status: AC
  Filled 2014-06-15: qty 2

## 2014-06-15 SURGICAL SUPPLY — 55 items
BAG DECANTER FOR FLEXI CONT (MISCELLANEOUS) ×3 IMPLANT
BLADE SURG 11 STRL SS (BLADE) ×3 IMPLANT
BLADE SURG 15 STRL LF DISP TIS (BLADE) ×1 IMPLANT
BLADE SURG 15 STRL SS (BLADE) ×3
CANISTER SUCTION 2500CC (MISCELLANEOUS) IMPLANT
CHLORAPREP W/TINT 26ML (MISCELLANEOUS) ×3 IMPLANT
COVER SURGICAL LIGHT HANDLE (MISCELLANEOUS) ×3 IMPLANT
COVER TRANSDUCER ULTRASND GEL (DRAPE) IMPLANT
CRADLE DONUT ADULT HEAD (MISCELLANEOUS) ×3 IMPLANT
DECANTER SPIKE VIAL GLASS SM (MISCELLANEOUS) ×3 IMPLANT
DRAPE C-ARM 42X72 X-RAY (DRAPES) ×3 IMPLANT
DRAPE LAPAROSCOPIC ABDOMINAL (DRAPES) ×3 IMPLANT
ELECT CAUTERY BLADE 6.4 (BLADE) ×3 IMPLANT
ELECT REM PT RETURN 9FT ADLT (ELECTROSURGICAL) ×3
ELECTRODE REM PT RTRN 9FT ADLT (ELECTROSURGICAL) ×1 IMPLANT
GAUZE SPONGE 4X4 16PLY XRAY LF (GAUZE/BANDAGES/DRESSINGS) ×3 IMPLANT
GEL ULTRASOUND 20GR AQUASONIC (MISCELLANEOUS) IMPLANT
GLOVE BIO SURGEON STRL SZ7 (GLOVE) ×3 IMPLANT
GLOVE BIOGEL PI IND STRL 7.0 (GLOVE) IMPLANT
GLOVE BIOGEL PI IND STRL 7.5 (GLOVE) ×1 IMPLANT
GLOVE BIOGEL PI INDICATOR 7.0 (GLOVE) ×4
GLOVE BIOGEL PI INDICATOR 7.5 (GLOVE) ×2
GOWN STRL REUS W/ TWL LRG LVL3 (GOWN DISPOSABLE) ×2 IMPLANT
GOWN STRL REUS W/TWL LRG LVL3 (GOWN DISPOSABLE) ×6
INTRODUCER COOK 11FR (CATHETERS) IMPLANT
KIT BASIN OR (CUSTOM PROCEDURE TRAY) ×3 IMPLANT
KIT PORT POWER 8FR ISP CVUE (Catheter) ×1 IMPLANT
KIT PORT POWER 9.6FR MRI PREA (Catheter) IMPLANT
KIT ROOM TURNOVER OR (KITS) ×3 IMPLANT
LIQUID BAND (GAUZE/BANDAGES/DRESSINGS) ×3 IMPLANT
NDL HYPO 25GX1X1/2 BEV (NEEDLE) ×1 IMPLANT
NEEDLE HYPO 25GX1X1/2 BEV (NEEDLE) ×3 IMPLANT
NS IRRIG 1000ML POUR BTL (IV SOLUTION) ×3 IMPLANT
PACK SURGICAL SETUP 50X90 (CUSTOM PROCEDURE TRAY) ×3 IMPLANT
PAD ARMBOARD 7.5X6 YLW CONV (MISCELLANEOUS) ×4 IMPLANT
PENCIL BUTTON HOLSTER BLD 10FT (ELECTRODE) ×3 IMPLANT
POWER PORT ×2 IMPLANT
SET INTRODUCER 12FR PACEMAKER (SHEATH) IMPLANT
SET SHEATH INTRODUCER 10FR (MISCELLANEOUS) IMPLANT
SHEATH COOK PEEL AWAY SET 9F (SHEATH) IMPLANT
STAPLER VISISTAT 35W (STAPLE) ×2 IMPLANT
SUT MNCRL AB 4-0 PS2 18 (SUTURE) ×3 IMPLANT
SUT PROLENE 2 0 SH DA (SUTURE) ×3 IMPLANT
SUT SILK 2 0 (SUTURE)
SUT SILK 2-0 18XBRD TIE 12 (SUTURE) IMPLANT
SUT VIC AB 3-0 SH 27 (SUTURE) ×3
SUT VIC AB 3-0 SH 27XBRD (SUTURE) ×1 IMPLANT
SYR 20ML ECCENTRIC (SYRINGE) ×6 IMPLANT
SYR 5ML LUER SLIP (SYRINGE) ×3 IMPLANT
SYR CONTROL 10ML LL (SYRINGE) ×2 IMPLANT
TOWEL OR 17X24 6PK STRL BLUE (TOWEL DISPOSABLE) ×3 IMPLANT
TOWEL OR 17X26 10 PK STRL BLUE (TOWEL DISPOSABLE) ×3 IMPLANT
TUBE CONNECTING 12'X1/4 (SUCTIONS)
TUBE CONNECTING 12X1/4 (SUCTIONS) IMPLANT
YANKAUER SUCT BULB TIP NO VENT (SUCTIONS) IMPLANT

## 2014-06-15 SURGICAL SUPPLY — 42 items
APPLICATOR CHLORAPREP 3ML ORNG (MISCELLANEOUS) ×2 IMPLANT
CANISTER SUCT 3000ML PPV (MISCELLANEOUS) ×3 IMPLANT
CATH THORACIC 28FR (CATHETERS) IMPLANT
CATH THORACIC 36FR (CATHETERS) IMPLANT
CATH TROCAR 20FR (CATHETERS) ×4 IMPLANT
CONT SPEC 4OZ CLIKSEAL STRL BL (MISCELLANEOUS) ×4 IMPLANT
COVER SURGICAL LIGHT HANDLE (MISCELLANEOUS) ×3 IMPLANT
COVER TABLE BACK 60X90 (DRAPES) ×3 IMPLANT
DRAPE CHEST BREAST 15X10 FENES (DRAPES) ×3 IMPLANT
GAUZE SPONGE 4X4 12PLY STRL (GAUZE/BANDAGES/DRESSINGS) ×3 IMPLANT
GAUZE SPONGE 4X4 16PLY XRAY LF (GAUZE/BANDAGES/DRESSINGS) ×3 IMPLANT
GAUZE VASELINE 1X8 (GAUZE/BANDAGES/DRESSINGS) ×2 IMPLANT
GLOVE BIO SURGEON STRL SZ7 (GLOVE) ×2 IMPLANT
GLOVE BIO SURGEON STRL SZ7.5 (GLOVE) ×3 IMPLANT
GLOVE BIOGEL PI IND STRL 7.5 (GLOVE) IMPLANT
GLOVE BIOGEL PI INDICATOR 7.5 (GLOVE) ×2
GOWN STRL REUS W/ TWL LRG LVL3 (GOWN DISPOSABLE) ×2 IMPLANT
GOWN STRL REUS W/TWL LRG LVL3 (GOWN DISPOSABLE) ×6
KIT BASIN OR (CUSTOM PROCEDURE TRAY) ×3 IMPLANT
KIT ROOM TURNOVER OR (KITS) ×3 IMPLANT
MARKER SKIN DUAL TIP RULER LAB (MISCELLANEOUS) ×2 IMPLANT
NDL HYPO 18GX1.5 BLUNT FILL (NEEDLE) IMPLANT
NDL HYPO 21X1 ECLIPSE (NEEDLE) IMPLANT
NDL HYPO 25X5/8 SAFETYGLIDE (NEEDLE) IMPLANT
NEEDLE HYPO 18GX1.5 BLUNT FILL (NEEDLE) ×3 IMPLANT
NEEDLE HYPO 21X1 ECLIPSE (NEEDLE) ×3 IMPLANT
NEEDLE HYPO 25X5/8 SAFETYGLIDE (NEEDLE) ×3 IMPLANT
PAD ARMBOARD 7.5X6 YLW CONV (MISCELLANEOUS) ×6 IMPLANT
SCALPEL PROTECTED #11 DISP (BLADE) ×2 IMPLANT
STAPLER VISISTAT 35W (STAPLE) ×2 IMPLANT
SUT SILK  1 MH (SUTURE)
SUT SILK 0 FSL (SUTURE) ×4 IMPLANT
SUT SILK 1 MH (SUTURE) IMPLANT
SYR 20CC LL (SYRINGE) ×2 IMPLANT
SYR CONTROL 10ML LL (SYRINGE) ×3 IMPLANT
SYSTEM SAHARA CHEST DRAIN RE-I (WOUND CARE) ×5 IMPLANT
TAPE CLOTH SURG 4X10 WHT LF (GAUZE/BANDAGES/DRESSINGS) ×2 IMPLANT
TOWEL OR 17X26 10 PK STRL BLUE (TOWEL DISPOSABLE) ×3 IMPLANT
TRAP SPECIMEN MUCOUS 40CC (MISCELLANEOUS) ×3 IMPLANT
TRAY CHEST TUBE INSERTION (SET/KITS/TRAYS/PACK) ×3 IMPLANT
TUBE CONNECTING 12'X1/4 (SUCTIONS) ×2
TUBE CONNECTING 12X1/4 (SUCTIONS) ×3 IMPLANT

## 2014-06-15 NOTE — Op Note (Signed)
Preoperative diagnosis: Breast cancer, needs venous access for chemotherapy Postoperative diagnosis: Same as above Procedure:Left subclavian PowerPort placement Surgeon: Dr. Serita Grammes Anesthesia: Gen. With LMA Estimated blood loss: Minimal Competitions: None Drains: None Specimens: None Sponge and needle count was correct at completion This patient to recovery stable  Indications: This a 74 yo female whose undergone breast conservation therapy for a triple negative right breast cancer. She has been determined to need chemotherapy. I discussed port placement prior to beginning for venous access.  Procedure: After informed consent was obtained the patient was taken the operating room. She was given antibiotics. Sequential compression devices were in place. She was placed under general anesthesia with an LMA. Her bilateral chest and neck was then prepped and draped in the standard sterile surgical fashion. A surgical timeout was then performed.  I made a couple attempts to access her right subclavian vein.This was difficult so I decided to abort this.I then accessed her left subclavian vein on the first pass.  I then made a pocket on the left chest.  I  tunneled the line between the 2 sites.I then placed the dilator and peel-away sheath. The line was placed. I then removed the peel-away sheath. I pulled the line back to be in the distal cava. I then connected this to the port. I sutured the port into position with 2-0 Prolene suture. This flushed easily and aspirated blood. I placed heparin in the port. Final fluoroscopy confirmed that the port and the line while in good position without any kinks. The tip was in good position also. I then closed with 3-0 Vicryl, 4-0 Monocryl, and glue. She tolerated this well and was transferred recovery room.

## 2014-06-15 NOTE — Progress Notes (Signed)
Report given to jamie rn as caregiver 

## 2014-06-15 NOTE — Op Note (Signed)
Preoperative diagnosis: bilateral ptx Postoperative diagnosis: same as above Procedure: bilateral 20 Fr chest tube placement Surgeon: Dr Serita Grammes  EBL: minimal Specimens:none Anesthesia: local mac Complications none Sponge count correct dispo to pacu stable  Indications: this is a 31 yof s/p port placement today with attempt at right side who now has bilateral pneumothoraces. I discussed bilateral chest tube placement.  Procedure: After informed consent was obtained patient was taken to the operating room.  She was placed under monitored anesthesia care.  Her chest was prepped and draped in the standard sterile surgical fashion.  A timeout was performed.  I infiltrated local anesthetic in the right chest.  I made a small incision. I entered into the chest cavity with a kelly clamp. There was a rush of air.  I then inserted a 20 Fr chest tube and secured it with a 2-0 silk suture.  A dressing was placed. I then inserted a left chest tube in a similar fashion.  A rush of air was noted.  This was secured and a dressing was placed. She was doing better and transferred to pacu stable.

## 2014-06-15 NOTE — H&P (Signed)
Laura Li is an 74 y.o. female.   Chief Complaint: breast cancer HPI:  83 yof who underwent left breast lumpectomy/sn biopsy for low grade, low proliferation index tnbc with negative margins and negative node. She has done well. she has some pain around her arm but is otherwise doing well. her mammaprint is high and she has decided to proceed after discussion with Dr Jana Hakim with chemotherapy.   Past Medical History  Diagnosis Date  . OA (osteoarthritis)   . History of DVT (deep vein thrombosis)   . Hyperlipidemia   . Hypertension   . Hypothyroidism   . HX: breast cancer     melanoma  . Melanoma   . Eczema   . Arthritis   . Esophageal stricture   . GERD (gastroesophageal reflux disease)   . PONV (postoperative nausea and vomiting)   . Wears glasses   . Depression     states no depression  . Chronic kidney disease     some renal impairment from meds  . Headache   . Anemia     from medicaions  . Heart murmur     states it is benign told 30-40 years ago    Past Surgical History  Procedure Laterality Date  . Appendectomy  1967  . Melanoma excision  2004    right side of face  . 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 Total abdominal hysterectomy  1992    including ovaries  . Tonsillectomy and adenoidectomy  1949  . Lipoma excision  1978    right breast  . Mastectomy  1998    right-nodes out  . Breast lumpectomy      left breast-benign  . Breast surgery  1977    removal of calcified milk gland right breast  . Colonoscopy    . Radioactive seed guided mastectomy with axillary sentinel lymph node biopsy Left 05/05/2014    Procedure: RADIOACTIVE SEED GUIDED LEFT LUMPECTOMY WITH AXILLARY SENTINEL LYMPH NODE BIOPSY;  Surgeon: Rolm Bookbinder, MD;  Location: [facility];  Service: General;  Laterality: Left;    Family History  Problem Relation Age of Onset  . Heart disease Father 73    smoker  . Endometrial cancer Mother 70  . Breast  cancer Cousin     x 3 cousins with breast cancer (one at 56, one at 89 and one at 7)  . Lung cancer Paternal Aunt     smoker  . Brain cancer Maternal Aunt   . Colon cancer Neg Hx   . Diabetes Maternal Aunt     ???  . Breast cancer Maternal Aunt 51   Social History:  reports that she quit smoking about 45 years ago. Her smoking use included Cigarettes. She has a 2.25 pack-year smoking history. She has never used smokeless tobacco. She reports that she drinks alcohol. She reports that she does not use illicit drugs.  Allergies:  Allergies  Allergen Reactions  . Dyazide [Hydrochlorothiazide W-Triamterene]     Presumption: drug induced vasculitis  . Niacin Other (See Comments)    flushing  . Penicillins Diarrhea and Nausea And Vomiting    Medications Prior to Admission  Medication Sig Dispense Refill  . acetaminophen (TYLENOL) 500 MG tablet Take 500 mg by mouth 2 (two) times daily as needed for mild pain.     [name] atorvastatin (LIPITOR) 20 MG tablet Take 1 tablet (20 mg total) by mouth daily. (Patient taking differently: Take 10 mg  by mouth daily. ) 90 tablet 3  . B Complex-C-Folic Acid (STRESS FORMULA PO) Take 1 tablet by mouth daily.      . Calcium-Vitamin D (CALTRATE 600 PLUS-VIT D PO) Take 1 tablet by mouth 2 (two) times daily.     . Carboxymethylcellulose Sodium (REFRESH LIQUIGEL OP) Apply 1 drop to eye at bedtime as needed (dry eyes).    [name] esomeprazole (NEXIUM) 40 MG capsule TAKE 1 CAPSULE DAILY (Patient taking differently: Take 40 mg by mouth daily. TAKE 1 CAPSULE DAILY) 90 capsule 3  . fexofenadine (ALLEGRA) 180 MG tablet Take 180 mg by mouth daily as needed for allergies.     [name] ipratropium (ATROVENT) 0.03 % nasal spray Place 2 sprays into the nose every 12 (twelve) hours. 180 mL 4  . Lactobacillus-Inulin (CULTURELLE DIGESTIVE HEALTH) CHEW Chew 1 capsule by mouth daily.     [name] levothyroxine (SYNTHROID, LEVOTHROID) 75 MCG tablet Take 1 tablet (75 mcg total) by mouth daily. 90 tablet  2  . nadolol (CORGARD) 20 MG tablet Take 0.5 tablets (10 mg total) by mouth daily. 90 tablet 1  . Polyethyl Glycol-Propyl Glycol (SYSTANE) 0.4-0.3 % SOLN Apply 1 drop to eye daily as needed (for dryness/itching).     . venlafaxine XR (EFFEXOR-XR) 75 MG 24 hr capsule Take 1 capsule (75 mg total) by mouth daily. 90 capsule 3  . HYDROcodone-acetaminophen (NORCO/VICODIN) 5-325 MG per tablet Take 1 tablet by mouth every 6 (six) hours as needed for moderate pain or severe pain. 20 tablet 0    Results for orders placed or performed during the hospital encounter of 06/14/14 (from the past 48 hour(s))  Basic metabolic panel     Status: Abnormal   Collection Time: 06/14/14 10:25 AM  Result Value Ref Range   Sodium 138 135 - 145 mmol/L   Potassium 4.1 3.5 - 5.1 mmol/L   Chloride 100 96 - 112 mmol/L   CO2 33 (H) 19 - 32 mmol/L   Glucose, Bld 79 70 - 99 mg/dL   BUN 16 6 - 23 mg/dL   Creatinine, Ser 0.97 0.50 - 1.10 mg/dL   Calcium 9.9 8.4 - 10.5 mg/dL   GFR calc non Af Amer 56 (L) >90 mL/min   GFR calc Af Amer 65 (L) >90 mL/min    Comment: (NOTE) The eGFR has been calculated using the CKD EPI equation. This calculation has not been validated in all clinical situations. eGFR's persistently <90 mL/min signify possible Chronic Kidney Disease.    Anion gap 5 5 - 15  CBC     Status: None   Collection Time: 06/14/14 10:26 AM  Result Value Ref Range   WBC 8.5 4.0 - 10.5 K/uL   RBC 4.18 3.87 - 5.11 MIL/uL   Hemoglobin 12.5 12.0 - 15.0 g/dL   HCT 37.5 36.0 - 46.0 %   MCV 89.7 78.0 - 100.0 fL   MCH 29.9 26.0 - 34.0 pg   MCHC 33.3 30.0 - 36.0 g/dL   RDW 13.0 11.5 - 15.5 %   Platelets 228 150 - 400 K/uL   No results found.  Review of Systems  All other systems reviewed and are negative.   Blood pressure 166/58, pulse 59, temperature 97 F (36.1 C), temperature source Oral, resp. rate 18, height 5' 6.5" (1.689 m), weight 66.089 kg (145 lb 11.2 oz), SpO2 100 %. Physical Exam   Physical Exam  Rolm Bookbinder MD; 05/18/2014 9:49 PM) Breast Note: healing left breast and axillary incisions without infection  cv rrr Lungs clear bilaterally  Assessment/Plan Assessment & Plan Rolm Bookbinder MD; 05/18/2014 9:50 PM) POSTOPERATIVE STATE [phone]) Plan port placement  [facility] 06/15/2014, 11:10 AM

## 2014-06-15 NOTE — Interval H&P Note (Signed)
History and Physical Interval Note:  11/11/9242 62:86 AM  Laura Li  has presented today for surgery, with the diagnosis of breast cancer  The various methods of treatment have been discussed with the patient and family. After consideration of risks, benefits and other options for treatment, the patient has consented to  Procedure(s): INSERTION PORT-A-CATH (N/A) as a surgical intervention .  The patient's history has been reviewed, patient examined, no change in status, stable for surgery.  I have reviewed the patient's chart and labs.  Questions were answered to the patient's satisfaction.     Debbe Crumble

## 2014-06-15 NOTE — Progress Notes (Signed)
CXR result called to Dr. Donne Hazel. Anesthesia also updated. VSS. Pt resting quietly. O2 via [place] reapplied. Pt's daughter @ bedside.

## 2014-06-15 NOTE — Progress Notes (Signed)
Patient ID: Laura Li, female   DOB: February 02, 1941, 74 y.o.   MRN: [id number] Patient asymptomatic with small left ptx after port. Will plan on monitoring her and repeating cxr. Discussed finding and possibility of tube

## 2014-06-15 NOTE — Interval H&P Note (Signed)
History and Physical Interval Note:  09/16/4126 2:08 PM  Laura Li  has presented today for surgery, with the diagnosis of pneumothorax   The various methods of treatment have been discussed with the patient and family. After consideration of risks, benefits and other options for treatment, the patient has consented to  Procedure(s): CHEST TUBE INSERTION (Bilateral) as a surgical intervention .  The patient's history has been reviewed, patient examined, no change in status, stable for surgery.  I have reviewed the patient's chart and labs.  Questions were answered to the patient's satisfaction.     Nelva Hauk

## 2014-06-15 NOTE — Anesthesia Postprocedure Evaluation (Signed)
  Anesthesia Post-op Note  Patient: Laura Li  Procedure(s) Performed: Procedure(s): INSERTION PORT-A-CATH (Left)  Patient Location: PACU  Anesthesia Type:General  Level of Consciousness: awake and alert   Airway and Oxygen Therapy: Patient Spontanous Breathing  Post-op Pain: none  Post-op Assessment: Post-op Vital signs reviewed and Patient's Cardiovascular Status Stable  Post-op Vital Signs: Reviewed and stable  Last Vitals:  Filed Vitals:   06/15/14 1330  BP: 151/74  Pulse: 64  Temp:   Resp:     Complications: No apparent anesthesia complications

## 2014-06-15 NOTE — Anesthesia Preprocedure Evaluation (Signed)
Anesthesia Evaluation  Patient identified by MRN, date of birth, ID band Patient awake    Reviewed: Allergy & Precautions, NPO status , Patient's Chart, lab work & pertinent test results  History of Anesthesia Complications (+) PONV  Airway Mallampati: II   Neck ROM: full    Dental   Pulmonary former smoker,  Pt has bilateral PTX s/p portacath placement today.         Cardiovascular hypertension,     Neuro/Psych  Headaches, Depression    GI/Hepatic GERD-  ,  Endo/Other  Hypothyroidism   Renal/GU      Musculoskeletal  (+) Arthritis -,   Abdominal   Peds  Hematology   Anesthesia Other Findings   Reproductive/Obstetrics                             Anesthesia Physical Anesthesia Plan  ASA: II and emergent  Anesthesia Plan: MAC   Post-op Pain Management:    Induction: Intravenous  Airway Management Planned: Simple Face Mask  Additional Equipment:   Intra-op Plan:   Post-operative Plan:   Informed Consent: I have reviewed the patients History and Physical, chart, labs and discussed the procedure including the risks, benefits and alternatives for the proposed anesthesia with the patient or authorized representative who has indicated his/her understanding and acceptance.     Plan Discussed with: CRNA, Anesthesiologist and Surgeon  Anesthesia Plan Comments:         Anesthesia Quick Evaluation

## 2014-06-15 NOTE — Progress Notes (Signed)
Patient ID: Laura Li, female   DOB: 1940-08-27, 74 y.o.   MRN: [id number] On repeat cxr she has bilateral ptx and is symptomatic now. Will go to or for bilateral chest tube placement under mac.  Discussed with patient and her daughter.

## 2014-06-15 NOTE — Progress Notes (Signed)
Air leak noted in Right Side chest tube and confirmed with second nurse.  Isolated leak to tubing connection.  RN going to another unit to get different tape.  Will re tape tubing connection.  Will continue to monitor and notify for changes.

## 2014-06-15 NOTE — Progress Notes (Signed)
MD at bedside. We have un taped and checked the connections on the Right side chest tube and have taped down the tube to eliminate the possibility of a perforation in the tubing itself.  Air leak persist and MD aware.  Pt SATs and vital signs remain stable and pt is ready to be transported back to the floor.  Un measurable output has collected bilaterally in chest tube drainage system.  Vaseline gauze with tape dressing applied to right chest puncture site.  Will transport back to room at this time.

## 2014-06-15 NOTE — H&P (View-Only) (Signed)
Patient ID: Laura Li, female   DOB: 07-Sep-1940, 74 y.o.   MRN: [id number] On repeat cxr she has bilateral ptx and is symptomatic now. Will go to or for bilateral chest tube placement under mac.  Discussed with patient and her daughter.

## 2014-06-15 NOTE — Transfer of Care (Signed)
Immediate Anesthesia Transfer of Care Note  Patient: Laura Li  Procedure(s) Performed: Procedure(s): CHEST TUBE INSERTION (Bilateral)  Patient Location: PACU  Anesthesia Type:MAC  Level of Consciousness: awake, alert  and oriented  Airway & Oxygen Therapy: Patient connected to nasal cannula oxygen  Post-op Assessment: Report given to RN, Post -op Vital signs reviewed and stable and Patient moving all extremities X 4  Post vital signs: Reviewed and stable  Last Vitals:  Filed Vitals:   06/15/14 2038  BP: 160/61  Pulse: 73  Temp: 36.7 C  Resp: 18    Complications: No apparent anesthesia complications

## 2014-06-15 NOTE — Transfer of Care (Signed)
Immediate Anesthesia Transfer of Care Note  Patient: Laura Li  Procedure(s) Performed: Procedure(s): INSERTION PORT-A-CATH (Left)  Patient Location: PACU  Anesthesia Type:General  Level of Consciousness: awake, alert , oriented and patient cooperative  Airway & Oxygen Therapy: Patient Spontanous Breathing and Patient connected to face mask oxygen  Post-op Assessment: Report given to RN, Post -op Vital signs reviewed and stable and Patient moving all extremities  Post vital signs: Reviewed and stable  Last Vitals:  Filed Vitals:   06/15/14 0938  BP: 166/58  Pulse: 59  Temp: 36.1 C  Resp: 18    Complications: No apparent anesthesia complications

## 2014-06-16 ENCOUNTER — Observation Stay (HOSPITAL_COMMUNITY): Payer: Medicare Other

## 2014-06-16 ENCOUNTER — Other Ambulatory Visit: Payer: Self-pay | Admitting: Oncology

## 2014-06-16 ENCOUNTER — Other Ambulatory Visit: Payer: Self-pay | Admitting: *Deleted

## 2014-06-16 ENCOUNTER — Encounter: Payer: Self-pay | Admitting: *Deleted

## 2014-06-16 ENCOUNTER — Encounter (HOSPITAL_COMMUNITY): Payer: Self-pay

## 2014-06-16 DIAGNOSIS — R0602 Shortness of breath: Secondary | ICD-10-CM | POA: Diagnosis not present

## 2014-06-16 DIAGNOSIS — C50919 Malignant neoplasm of unspecified site of unspecified female breast: Secondary | ICD-10-CM | POA: Diagnosis present

## 2014-06-16 DIAGNOSIS — Z880 Allergy status to penicillin: Secondary | ICD-10-CM | POA: Diagnosis not present

## 2014-06-16 DIAGNOSIS — R918 Other nonspecific abnormal finding of lung field: Secondary | ICD-10-CM | POA: Diagnosis not present

## 2014-06-16 DIAGNOSIS — J9811 Atelectasis: Secondary | ICD-10-CM | POA: Diagnosis not present

## 2014-06-16 DIAGNOSIS — J939 Pneumothorax, unspecified: Secondary | ICD-10-CM | POA: Diagnosis not present

## 2014-06-16 DIAGNOSIS — Z79899 Other long term (current) drug therapy: Secondary | ICD-10-CM | POA: Diagnosis not present

## 2014-06-16 DIAGNOSIS — Z87891 Personal history of nicotine dependence: Secondary | ICD-10-CM | POA: Diagnosis not present

## 2014-06-16 DIAGNOSIS — J95811 Postprocedural pneumothorax: Secondary | ICD-10-CM | POA: Diagnosis present

## 2014-06-16 DIAGNOSIS — Z4682 Encounter for fitting and adjustment of non-vascular catheter: Secondary | ICD-10-CM | POA: Diagnosis not present

## 2014-06-16 DIAGNOSIS — T85628A Displacement of other specified internal prosthetic devices, implants and grafts, initial encounter: Secondary | ICD-10-CM | POA: Diagnosis not present

## 2014-06-16 DIAGNOSIS — Z888 Allergy status to other drugs, medicaments and biological substances status: Secondary | ICD-10-CM | POA: Diagnosis not present

## 2014-06-16 NOTE — Progress Notes (Signed)
1 Day Post-Op  Subjective: Pain with deep breaths, has not voided overnight  Objective: Vital signs in last 24 hours: Temp:  [97 F (36.1 C)-98.2 F (36.8 C)] 97.6 F (36.4 C) (04/07 0625) Pulse Rate:  [55-93] 89 (04/07 0625) Resp:  [12-19] 16 (04/07 0625) BP: (90-180)/(42-88) 142/79 mmHg (04/07 0625) SpO2:  [94 %-100 %] 96 % (04/07 0625) Weight:  [66.089 kg (145 lb 11.2 oz)-66.225 kg (146 lb)] 66.225 kg (146 lb) (04/06 1726)    Intake/Output from previous day: 04/06 0701 - 04/07 0700 In: 1224.2 [P.O.:120; I.V.:1104.2] Out: 20 [Blood:20] Intake/Output this shift: Total I/O In: 624.2 [P.O.:120; I.V.:504.2] Out: 0   Chest tubes functional without an air leak  Lab Results:   Recent Labs  06/14/14 1026  WBC 8.5  HGB 12.5  HCT 37.5  PLT 228   BMET  Recent Labs  06/14/14 1025  NA 138  K 4.1  CL 100  CO2 33*  GLUCOSE 79  BUN 16  CREATININE 0.97  CALCIUM 9.9   PT/INR No results for input(s): LABPROT, INR in the last 72 hours. ABG No results for input(s): PHART, HCO3 in the last 72 hours.  Invalid input(s): PCO2, PO2  Studies/Results: Dg Chest Port 1 View  06/15/2014   CLINICAL DATA:  Bilateral chest tube placement for pneumothoraces.  EXAM: PORTABLE CHEST - 1 VIEW  COMPARISON:  06/15/2014 at 6:26 p.m.  FINDINGS: Left-sided power injectable Port-A-Cath observed, tip projecting over the SVC.  Bilateral chest tubes are present. 5% residual left apical pneumothorax. No visible residual right pneumothorax.  Mild enlargement of the cardiopericardial silhouette.  No edema.  IMPRESSION: 1. Bilateral chest tubes of in place. Tiny residual left apical pneumothorax ; right pneumothorax is no longer present. 2. Mild enlargement of the cardiopericardial silhouette.   Electronically Signed   By: Van Clines M.D.   On: 06/15/2014 21:24   Dg Chest Port 1 View  06/15/2014   CLINICAL DATA:  Followup pneumothorax after port placement.  EXAM: PORTABLE CHEST - 1 VIEW   COMPARISON:  06/15/2014  FINDINGS: Increased small left apical pneumothorax, now 10-20%. There is a new, large right apical pneumothorax with mild diaphragm depression but no mediastinal shift.  Normal heart size and aortic contours.  Unremarkable positioning of left subclavian porta catheter.  Critical Value/emergent results were called by telephone at the time of interpretation on 06/15/2014 at 6:56 pm to Dr. Rolm Bookbinder , who verbally acknowledged these results.  IMPRESSION: 1. New large right pneumothorax with early diaphragm depression. 2. Enlarging left pneumothorax, now estimated 10 -20%.   Electronically Signed   By: Monte Fantasia M.D.   On: 06/15/2014 18:57   Dg Chest Port 1 View  06/15/2014   CLINICAL DATA:  Initial encounter for Port-A-Cath placement.  EXAM: PORTABLE CHEST - 1 VIEW  COMPARISON:  12/10/2012.  FINDINGS: 13 27 hrs. left Port-A-Cath is new in the interval with tip position overlying the mid SVC. Prominent leftward patient rotation distorts location of cardiomediastinal anatomy limiting assessment of catheter position. There is a small left apical pneumothorax. Right lung is clear. Telemetry leads overlie the chest.  IMPRESSION: Small left apical pneumothorax status post Port-A-Cath placement.  These results will be called to the ordering clinician or representative by the Radiologist Assistant, and communication documented in the PACS or zVision Dashboard.   Electronically Signed   By: Misty Stanley M.D.   On: 06/15/2014 15:03   Dg Fluoro Guide Cv Line-no Report  06/15/2014   CLINICAL DATA:  FLOURO GUIDE CV LINE  Fluoroscopy was utilized by the requesting physician.  No radiographic  interpretation.     Anti-infectives: Anti-infectives    Start     Dose/Rate Route Frequency Ordered Stop   06/15/14 0600  ciprofloxacin (CIPRO) IVPB 400 mg     400 mg 200 mL/hr over 60 Minutes Intravenous On call to O.R. 06/14/14 1405 06/15/14 1153      Assessment/Plan: Bilateral  ptx  Will check cxr this am, no air leaks, if cxr fine will water seal tubes with hope of getting out tomorrow and discharging      [facility] 06/16/2014

## 2014-06-16 NOTE — Addendum Note (Signed)
Addendum  created 06/16/14 [id number] by Rogers Blocker, CRNA   Modules edited: Anesthesia Attestations

## 2014-06-16 NOTE — Progress Notes (Signed)
UR completed 

## 2014-06-16 NOTE — Anesthesia Postprocedure Evaluation (Signed)
Anesthesia Post Note  Patient: Laura Li  Procedure(s) Performed: Procedure(s) (LRB): CHEST TUBE INSERTION (Bilateral)  Anesthesia type: MAC  Patient location: PACU  Post pain: Pain level controlled and Adequate analgesia  Post assessment: Post-op Vital signs reviewed, Patient's Cardiovascular Status Stable and Respiratory Function Stable  Last Vitals:  Filed Vitals:   06/16/14 0240  BP: 153/85  Pulse: 93  Temp:   Resp: 16    Post vital signs: Reviewed and stable  Level of consciousness: awake, alert  and oriented  Complications: No apparent anesthesia complications

## 2014-06-16 NOTE — Progress Notes (Signed)
Patient is in the hospital and needs her appointments rescheduled.  Cancelled her appointment for tomorrow and she is aware.  Confirmed new appointment for 06/22/14 at 845am with Heather. Patient would like to see how she feels at her appointment with [facility] before deciding on when to start chemo.

## 2014-06-17 ENCOUNTER — Inpatient Hospital Stay (HOSPITAL_COMMUNITY): Payer: Medicare Other

## 2014-06-17 ENCOUNTER — Telehealth: Payer: Self-pay | Admitting: Nurse Practitioner

## 2014-06-17 ENCOUNTER — Telehealth: Payer: Self-pay | Admitting: *Deleted

## 2014-06-17 ENCOUNTER — Ambulatory Visit: Payer: Medicare Other | Admitting: Nurse Practitioner

## 2014-06-17 ENCOUNTER — Other Ambulatory Visit: Payer: Self-pay | Admitting: *Deleted

## 2014-06-17 MED ORDER — SODIUM CHLORIDE 0.9 % IJ SOLN
10.0000 mL | INTRAMUSCULAR | Status: DC | PRN
  Administered 2014-06-17 – 2014-06-21 (×2): 10 mL
  Filled 2014-06-17 (×2): qty 40

## 2014-06-17 NOTE — Telephone Encounter (Signed)
pt daughter [name] cld to chge pt appt to Thurs-ok per Nira Conn to add

## 2014-06-17 NOTE — Telephone Encounter (Signed)
Per staff message and POF I have scheduled appts. Advised scheduler of appts and to move labs. JMW  

## 2014-06-17 NOTE — Progress Notes (Signed)
2 Days Post-Op  Subjective: Tired, breathing fine, tol diet, pain with deep breaths   Objective: Vital signs in last 24 hours: Temp:  [97.8 F (36.6 C)-99.1 F (37.3 C)] 98.7 F (37.1 C) (04/08 0500) Pulse Rate:  [69-83] 69 (04/08 0500) Resp:  [16-20] 20 (04/08 0500) BP: (123-138)/(49-69) 138/52 mmHg (04/08 0500) SpO2:  [92 %-96 %] 95 % (04/08 0500)    Intake/Output from previous day: 04/07 0701 - 04/08 0700 In: 100 [P.O.:100] Out: 12 [Urine:1; Stool:1; Chest Tube:10] Intake/Output this shift:    Chest tubes appear in place but right tube backed out Clear breath sounds bilaterally  Lab Results:   Recent Labs  06/14/14 1026  WBC 8.5  HGB 12.5  HCT 37.5  PLT 228   BMET  Recent Labs  06/14/14 1025  NA 138  K 4.1  CL 100  CO2 33*  GLUCOSE 79  BUN 16  CREATININE 0.97  CALCIUM 9.9   PT/INR No results for input(s): LABPROT, INR in the last 72 hours. ABG No results for input(s): PHART, HCO3 in the last 72 hours.  Invalid input(s): PCO2, PO2  Studies/Results: Dg Chest 2 View  06/16/2014   ADDENDUM REPORT: 06/16/2014 14:32  ADDENDUM: Critical Value/emergent results were called by telephone at the time of interpretation on 06/16/2014 at 2:32 pm to [facility] , who verbally acknowledged these results.   Electronically Signed   By: Laura Li M.D.   On: 06/16/2014 14:32   06/16/2014   CLINICAL DATA:  Shortness of breath. Prior documentation of pneumothorax.  EXAM: CHEST  2 VIEW  COMPARISON:  Study obtained earlier in the day  FINDINGS: The small left apical pneumothorax is stable. Chest tube remains in place on the left. Port-A-Cath tip is in the superior vena cava. There is a chest tube on the right which has withdrawn several cm. There is a right pneumothorax which is increased in size compared to earlier in the day.  There is mild left base atelectasis. Lungs are otherwise clear. Heart size and pulmonary vascularity are normal. No adenopathy.   IMPRESSION: Pneumothorax on the right has increased in size after right-sided chest tube has withdrawn several cm. No change in position of left chest tube with small left apical pneumothorax. Lungs are clear except for mild medial left base atelectasis. No change in cardiac silhouette.  Electronically Signed: By: Laura Li M.D. On: 06/16/2014 14:20   Dg Chest Port 1 View  06/17/2014   CLINICAL DATA:  Bilateral chest tubes.  EXAM: PORTABLE CHEST - 1 VIEW  COMPARISON:  Radiograph 06/16/2014  FINDINGS: The a right chest tube has migrated into the subcutaneous tissue of the right chest wall. There is no change in the volume of the right apical pneumothorax which measures 17 mm from the apical chest wall compared to 16 mm on prior.  Left chest tube remains unchanged in position. The small left apical pneumothorax measures 4 mm from the apical chest wall compared to 6 mm on prior.  Left power port unchanged in position. No pulmonary edema. Left pleural effusion unchanged.  IMPRESSION: 1. Stable biapical pneumothoraces. 2. Right chest tube has migrated out of the pleural space into the right subcutaneous tissue. 3. Left chest tube unchanged. Findings conveyed toMATTHEW Laura Li on 06/17/2014  at07:55.   Electronically Signed   By: Laura Li M.D.   On: 06/17/2014 07:56   Dg Chest Port 1 View  06/16/2014   CLINICAL DATA:  Reassess chest tubes for  treatment of pneumothorax  EXAM: PORTABLE CHEST - 1 VIEW  COMPARISON:  Portable chest x-rays of June 15, 2014  FINDINGS: Bilateral chest tubes remain in place. The right lung is well-expanded and clear. On the left a tiny stable apical pneumothorax is demonstrated. There is no mediastinal shift. There is no significant pleural effusion. The cardiac silhouette is top-normal in size. The pulmonary vascularity is not engorged. The bony thorax is unremarkable. The heart size and mediastinal contours are within normal limits. Both lungs are clear. The visualized  skeletal structures are unremarkable.  IMPRESSION: Residual tiny left apical pneumothorax amounting to less than 5% of the lung volume.   Electronically Signed   By: Laura Li   On: 06/16/2014 07:58   Dg Chest Port 1 View  06/15/2014   CLINICAL DATA:  Bilateral chest tube placement for pneumothoraces.  EXAM: PORTABLE CHEST - 1 VIEW  COMPARISON:  06/15/2014 at 6:26 p.m.  FINDINGS: Left-sided power injectable Port-A-Cath observed, tip projecting over the SVC.  Bilateral chest tubes are present. 5% residual left apical pneumothorax. No visible residual right pneumothorax.  Mild enlargement of the cardiopericardial silhouette.  No edema.  IMPRESSION: 1. Bilateral chest tubes of in place. Tiny residual left apical pneumothorax ; right pneumothorax is no longer present. 2. Mild enlargement of the cardiopericardial silhouette.   Electronically Signed   By: Laura Li M.D.   On: 06/15/2014 21:24   Dg Chest Port 1 View  06/15/2014   CLINICAL DATA:  Followup pneumothorax after port placement.  EXAM: PORTABLE CHEST - 1 VIEW  COMPARISON:  06/15/2014  FINDINGS: Increased small left apical pneumothorax, now 10-20%. There is a new, large right apical pneumothorax with mild diaphragm depression but no mediastinal shift.  Normal heart size and aortic contours.  Unremarkable positioning of left subclavian porta catheter.  Critical Value/emergent results were called by telephone at the time of interpretation on 06/15/2014 at 6:56 pm to Dr. Rolm Li , who verbally acknowledged these results.  IMPRESSION: 1. New large right pneumothorax with early diaphragm depression. 2. Enlarging left pneumothorax, now estimated 10 -20%.   Electronically Signed   By: Laura Li M.D.   On: 06/15/2014 18:57   Dg Chest Port 1 View  06/15/2014   CLINICAL DATA:  Initial encounter for Port-A-Cath placement.  EXAM: PORTABLE CHEST - 1 VIEW  COMPARISON:  12/10/2012.  FINDINGS: 13 27 hrs. left Port-A-Cath is new in the interval  with tip position overlying the mid SVC. Prominent leftward patient rotation distorts location of cardiomediastinal anatomy limiting assessment of catheter position. There is a small left apical pneumothorax. Right lung is clear. Telemetry leads overlie the chest.  IMPRESSION: Small left apical pneumothorax status post Port-A-Cath placement.  These results will be called to the ordering clinician or representative by the Radiologist Assistant, and communication documented in the PACS or zVision Dashboard.   Electronically Signed   By: Misty Stanley M.D.   On: 06/15/2014 15:03   Dg Fluoro Guide Cv Line-no Report  06/15/2014   CLINICAL DATA:    FLOURO GUIDE CV LINE  Fluoroscopy was utilized by the requesting physician.  No radiographic  interpretation.     Anti-infectives: Anti-infectives    Start     Dose/Rate Route Frequency Ordered Stop   06/15/14 0600  ciprofloxacin (CIPRO) IVPB 400 mg     400 mg 200 mL/hr over 60 Minutes Intravenous On call to O.R. 06/14/14 1405 06/15/14 1153      Assessment/Plan: Bilateral ptx s/p port  Right tube has backed out and not functional after movement yesterday, cxr with stable ptx, left side no air leak and minimal ptx smaller I removed both tubes today. Will plan dc tomorrow if does well and no recurrence  [facility] 06/17/2014

## 2014-06-18 ENCOUNTER — Inpatient Hospital Stay (HOSPITAL_COMMUNITY): Payer: Medicare Other

## 2014-06-18 MED ORDER — TRAMADOL HCL 50 MG PO TABS
50.0000 mg | ORAL_TABLET | Freq: Four times a day (QID) | ORAL | Status: DC | PRN
  Administered 2014-06-18 – 2014-06-22 (×6): 50 mg via ORAL
  Filled 2014-06-18 (×7): qty 1

## 2014-06-18 NOTE — Progress Notes (Signed)
Right lung down slightly and pt not up much walking.  Not visibly SOB but would continue on O2 for now and recheck CXR in am.  Pt lives by herself.   Sats ok but of O2 now.  sats 90- 95% RA.  No  tachypnea

## 2014-06-18 NOTE — Progress Notes (Signed)
Patient ID: Laura Li, female   DOB: 12-Jun-1940, 74 y.o.   MRN: [id number] 3 Days Post-Op  Subjective: Some right-sided chest pain with motion and deep breathing, unchanged. No shortness of breath or other complaints.  Objective: Vital signs in last 24 hours: Temp:  [98 F (36.7 C)-99.1 F (37.3 C)] 99.1 F (37.3 C) (04/09 0459) Pulse Rate:  [74-78] 74 (04/09 0459) Resp:  [18-20] 19 (04/09 0459) BP: (143-171)/(54-64) 158/63 mmHg (04/09 0622) SpO2:  [90 %-95 %] 90 % (04/09 0459) Last BM Date: 06/15/14  Intake/Output from previous day: 04/08 0701 - 04/09 0700 In: 350 [P.O.:340; I.V.:10] Out: -  Intake/Output this shift:    General appearance: alert, cooperative and no distress Resp: clear to auscultation bilaterally and breath sounds equal, no increased work of breathing Incision/Wound: port site clean  Lab Results:  No results for input(s): WBC, HGB, HCT, PLT in the last 72 hours. BMET No results for input(s): NA, K, CL, CO2, GLUCOSE, BUN, CREATININE, CALCIUM in the last 72 hours.   Studies/Results: Dg Chest 2 View  06/16/2014   ADDENDUM REPORT: 06/16/2014 14:32  ADDENDUM: Critical Value/emergent results were called by telephone at the time of interpretation on 06/16/2014 at 2:32 pm to [facility], [facility] , who verbally acknowledged these results.   Electronically Signed   By: Laura Li M.D.   On: 06/16/2014 14:32   06/16/2014   CLINICAL DATA:  Shortness of breath. Prior documentation of pneumothorax.  EXAM: CHEST  2 VIEW  COMPARISON:  Study obtained earlier in the day  FINDINGS: The small left apical pneumothorax is stable. Chest tube remains in place on the left. Port-A-Cath tip is in the superior vena cava. There is a chest tube on the right which has withdrawn several cm. There is a right pneumothorax which is increased in size compared to earlier in the day.  There is mild left base atelectasis. Lungs are otherwise clear. Heart size and pulmonary vascularity are  normal. No adenopathy.  IMPRESSION: Pneumothorax on the right has increased in size after right-sided chest tube has withdrawn several cm. No change in position of left chest tube with small left apical pneumothorax. Lungs are clear except for mild medial left base atelectasis. No change in cardiac silhouette.  Electronically Signed: By: Laura Li M.D. On: 06/16/2014 14:20   Dg Chest Port 1 View  06/17/2014   CLINICAL DATA:  Pneumothorax post chest tube removal  EXAM: PORTABLE CHEST - 1 VIEW  COMPARISON:  Portable exam 1730 hr compared to 0731 hr  FINDINGS: BILATERAL thoracostomy tubes no longer identified.  LEFT subclavian Port-A-Cath with tip projecting over SVC.  Normal heart size, mediastinal contours and pulmonary vascularity.  RIGHT apex pneumothorax not significantly changed from previous exam.  Tiny LEFT apex pneumothorax unchanged.  Bibasilar atelectasis.  No gross pulmonary infiltrate.  IMPRESSION: Persistent RIGHT and tiny LEFT apex pneumothoraces unchanged from previous exam.  Bibasilar atelectasis.   Electronically Signed   By: Laura Li M.D.   On: 06/17/2014 17:55   Dg Chest Port 1 View  06/17/2014   CLINICAL DATA:  Bilateral chest tubes.  EXAM: PORTABLE CHEST - 1 VIEW  COMPARISON:  Radiograph 06/16/2014  FINDINGS: The a right chest tube has migrated into the subcutaneous tissue of the right chest wall. There is no change in the volume of the right apical pneumothorax which measures 17 mm from the apical chest wall compared to 16 mm on prior.  Left chest tube remains unchanged in  position. The small left apical pneumothorax measures 4 mm from the apical chest wall compared to 6 mm on prior.  Left power port unchanged in position. No pulmonary edema. Left pleural effusion unchanged.  IMPRESSION: 1. Stable biapical pneumothoraces. 2. Right chest tube has migrated out of the pleural space into the right subcutaneous tissue. 3. Left chest tube unchanged. Findings conveyed toMATTHEW  Li on 06/17/2014  at07:55.   Electronically Signed   By: Laura Li M.D.   On: 06/17/2014 07:56    Anti-infectives: Anti-infectives    Start     Dose/Rate Route Frequency Ordered Stop   06/15/14 0600  ciprofloxacin (CIPRO) IVPB 400 mg     400 mg 200 mL/hr over 60 Minutes Intravenous On call to O.R. 06/14/14 1405 06/15/14 1153      Assessment/Plan: Bilateral postprocedural pneumothorax s/p Procedure(s): CHEST TUBE INSERTION Check chest x-ray this morning. If stable or improved, okay for discharge    LOS: 2 days    Laura Li T 06/18/2014

## 2014-06-18 NOTE — Progress Notes (Signed)
Pt has visibly been in discomfort this afternoon. Pt reports at times the pain get better as long as she isn't trying to breathe. Pt encouraged to use the incentive spirometer but reports very painful. Given PO PRN pain medication and offered IV PRN but pt declined wanting IV medications. Pt's daughter is concerned about pt being able to go back home in her current state. Pt also has had a little confusion this afternoon. Pt hasn't been very mobile. Pt has only been up to the bathroom.

## 2014-06-19 ENCOUNTER — Encounter (HOSPITAL_COMMUNITY): Payer: Self-pay | Admitting: Radiology

## 2014-06-19 ENCOUNTER — Inpatient Hospital Stay (HOSPITAL_COMMUNITY): Payer: Medicare Other

## 2014-06-19 MED ORDER — FENTANYL CITRATE 0.05 MG/ML IJ SOLN
INTRAMUSCULAR | Status: AC
  Filled 2014-06-19: qty 2

## 2014-06-19 MED ORDER — LIDOCAINE HCL 1 % IJ SOLN
INTRAMUSCULAR | Status: AC
  Filled 2014-06-19: qty 20

## 2014-06-19 MED ORDER — FENTANYL CITRATE 0.05 MG/ML IJ SOLN
INTRAMUSCULAR | Status: AC | PRN
Start: 2014-06-19 — End: 2014-06-19
  Administered 2014-06-19: 50 ug via INTRAVENOUS

## 2014-06-19 NOTE — Sedation Documentation (Signed)
Patient denies pain and is resting comfortably.  

## 2014-06-19 NOTE — Progress Notes (Signed)
Patient ID: Laura Li, female   DOB: [birth year], 74 y.o.   MRN: [id number] Still with some right sided cp with inspiration, oxygen sats are in low 90s with ambulation, not requiring oxygen. Xray same or a little worse. I think due to tube being pulled early and some reaccumulation with symptoms we should aspirate and place small tube. I have discussed with dr Barbie Banner of [facility] and will do this today.

## 2014-06-19 NOTE — Progress Notes (Signed)
Patient ID: Laura Li, female   DOB: March 12, 1940, 74 y.o.   MRN: [id number] 4 Days Post-Op  Subjective: Patient still with some bilateral chest pain with inspiration, no better or no worse. Not short of breath.  Objective: Vital signs in last 24 hours: Temp:  [98.2 F (36.8 C)-98.3 F (36.8 C)] 98.2 F (36.8 C) (04/10 0457) Pulse Rate:  [69-81] 81 (04/10 0811) Resp:  [16-18] 18 (04/10 0811) BP: (120-157)/(56-63) 157/62 mmHg (04/10 0811) SpO2:  [89 %-97 %] 92 % (04/10 0811) Last BM Date: 06/15/14  Intake/Output from previous day: 04/09 0701 - 04/10 0700 In: 240 [P.O.:240] Out: -  Intake/Output this shift:    General appearance: alert, cooperative and no distress Resp: clear to auscultation bilaterally and no increased work of breathing  Lab Results:  No results for input(s): WBC, HGB, HCT, PLT in the last 72 hours. BMET No results for input(s): NA, K, CL, CO2, GLUCOSE, BUN, CREATININE, CALCIUM in the last 72 hours.   Studies/Results: Dg Chest Port 1 View  06/18/2014   CLINICAL DATA:  Follow-up pneumothorax, Port-A-Cath placement  EXAM: PORTABLE CHEST - 1 VIEW  COMPARISON:  06/17/2014  FINDINGS: Cardiomediastinal silhouette is stable. Left subclavian Port-A-Cath is unchanged in position. There is minimal increased small right apical pneumothorax measures 2 cm in thickness. On the prior exam measures 1.7 cm in thickness. No left pneumothorax. No acute infiltrate or pulmonary edema.  IMPRESSION: Left subclavian Port-A-Cath is unchanged in position. There is minimal increased small right apical pneumothorax measures 2 cm in thickness. On the prior exam measures 1.7 cm in thickness. No left pneumothorax. No acute infiltrate or pulmonary edema.   Electronically Signed   By: Lahoma Crocker M.D.   On: 06/18/2014 10:51   Dg Chest Port 1 View  06/17/2014   CLINICAL DATA:  Pneumothorax post chest tube removal  EXAM: PORTABLE CHEST - 1 VIEW  COMPARISON:  Portable exam 1730 hr compared to 0731 hr   FINDINGS: BILATERAL thoracostomy tubes no longer identified.  LEFT subclavian Port-A-Cath with tip projecting over SVC.  Normal heart size, mediastinal contours and pulmonary vascularity.  RIGHT apex pneumothorax not significantly changed from previous exam.  Tiny LEFT apex pneumothorax unchanged.  Bibasilar atelectasis.  No gross pulmonary infiltrate.  IMPRESSION: Persistent RIGHT and tiny LEFT apex pneumothoraces unchanged from previous exam.  Bibasilar atelectasis.   Electronically Signed   By: Lavonia Dana M.D.   On: 06/17/2014 17:55    Anti-infectives: Anti-infectives    Start     Dose/Rate Route Frequency Ordered Stop   06/15/14 0600  ciprofloxacin (CIPRO) IVPB 400 mg     400 mg 200 mL/hr over 60 Minutes Intravenous On call to O.R. 06/14/14 1405 06/15/14 1153      Assessment/Plan: Bilateral postprocedural pneumothorax s/p Procedure(s): CHEST TUBE INSERTION Chest x-ray yesterday showed minimal increase in right pneumothorax. Repeat chest x-ray pending today. Check O2 sat off of oxygen. If this is okay and chest x-ray is improved to think she could go home.    LOS: 3 days    Genavive Kubicki T 06/19/2014

## 2014-06-19 NOTE — Procedures (Signed)
R chest tube 12 Fr R PTX No comp

## 2014-06-19 NOTE — Progress Notes (Signed)
Pt's O2 sat on room air was 92%.

## 2014-06-20 ENCOUNTER — Encounter: Payer: Medicare Other | Admitting: Physical Therapy

## 2014-06-20 ENCOUNTER — Other Ambulatory Visit: Payer: Medicare Other

## 2014-06-20 ENCOUNTER — Inpatient Hospital Stay (HOSPITAL_COMMUNITY): Payer: Medicare Other

## 2014-06-20 ENCOUNTER — Ambulatory Visit: Payer: Medicare Other | Admitting: Nurse Practitioner

## 2014-06-20 ENCOUNTER — Ambulatory Visit: Payer: Medicare Other

## 2014-06-20 DIAGNOSIS — Z9689 Presence of other specified functional implants: Secondary | ICD-10-CM | POA: Insufficient documentation

## 2014-06-20 MED ORDER — ENOXAPARIN SODIUM 40 MG/0.4ML ~~LOC~~ SOLN
40.0000 mg | SUBCUTANEOUS | Status: DC
  Administered 2014-06-20 – 2014-06-22 (×3): 40 mg via SUBCUTANEOUS
  Filled 2014-06-20 (×3): qty 0.4

## 2014-06-20 NOTE — Progress Notes (Signed)
5 Days Post-Op  Subjective: Some right sided pain, ow better  Objective: Vital signs in last 24 hours: Temp:  [97.6 F (36.4 C)-98.2 F (36.8 C)] 97.6 F (36.4 C) (04/11 0641) Pulse Rate:  [61-81] 70 (04/11 0641) Resp:  [14-18] 16 (04/11 0641) BP: (118-166)/(58-124) 163/124 mmHg (04/11 0641) SpO2:  [92 %-100 %] 100 % (04/11 0641) Last BM Date: 06/14/14 (patient states she believes it was tuesday)  Intake/Output from previous day: 04/10 0701 - 04/11 0700 In: 835 [P.O.:760] Out: 200 [Urine:200] Intake/Output this shift:    Resp: clear to auscultation bilaterally  Lab Results:  No results for input(s): WBC, HGB, HCT, PLT in the last 72 hours. BMET No results for input(s): NA, K, CL, CO2, GLUCOSE, BUN, CREATININE, CALCIUM in the last 72 hours. PT/INR No results for input(s): LABPROT, INR in the last 72 hours. ABG No results for input(s): PHART, HCO3 in the last 72 hours.  Invalid input(s): PCO2, PO2  Studies/Results: Dg Chest Port 1 View  06/19/2014   CLINICAL DATA:  Right-sided pneumothorax. Left-sided port placement on 06/15/2014.  EXAM: PORTABLE CHEST - 1 VIEW  COMPARISON:  06/18/2014.  FINDINGS: Right apical pneumothorax is again identified measuring 2.4 cm in maximum thickness. Previously 2.3 cm. Tiny residual left apical coal pneumothorax is barely visible. Chest wall port a catheter is no with tip in the projection of the SVC. Normal heart size. Small pleural effusions are identified.  IMPRESSION: 1. Slight increase in volume of right apical pneumothorax. 2. Tiny left apical pneumothorax is barely visible.   Electronically Signed   By: Kerby Moors M.D.   On: 06/19/2014 10:32   Dg Chest Port 1 View  06/18/2014   CLINICAL DATA:  Follow-up pneumothorax, Port-A-Cath placement  EXAM: PORTABLE CHEST - 1 VIEW  COMPARISON:  06/17/2014  FINDINGS: Cardiomediastinal silhouette is stable. Left subclavian Port-A-Cath is unchanged in position. There is minimal increased small right  apical pneumothorax measures 2 cm in thickness. On the prior exam measures 1.7 cm in thickness. No left pneumothorax. No acute infiltrate or pulmonary edema.  IMPRESSION: Left subclavian Port-A-Cath is unchanged in position. There is minimal increased small right apical pneumothorax measures 2 cm in thickness. On the prior exam measures 1.7 cm in thickness. No left pneumothorax. No acute infiltrate or pulmonary edema.   Electronically Signed   By: Lahoma Crocker M.D.   On: 06/18/2014 10:51   Ct Image Guided Fluid Drain By Catheter  06/19/2014   CLINICAL DATA:  Right pneumothorax  EXAM: CT IMAGE GUIDED FLUID DRAIN BY CATHETER  FLUOROSCOPY TIME:  None  MEDICATIONS AND MEDICAL HISTORY: Versed 0 mg, Fentanyl 50 mcg.  Additional Medications: None.  ANESTHESIA/SEDATION: None  CONTRAST:  None  PROCEDURE: The procedure, risks, benefits, and alternatives were explained to the patient. Questions regarding the procedure were encouraged and answered. The patient understands and consents to the procedure.  The right anterior upper thorax was prepped with Betadine in a sterile fashion, and a sterile drape was applied covering the operative field. A sterile gown and sterile gloves were used for the procedure.  Under CT guidance, a 19 gauge needle was inserted into the pleural space in the second rib interspace at the mid axillary line and removed over an Amplatz wire. A 12 French dilator followed by a 12 French pigtail drain were advanced over the Amplatz and coiled over the right lung apex. It was sewn to the skin with 0 silk. It was attached to a Pneumovac apparatus.  FINDINGS: Images  document placement of a 12 French right apical thoracostomy.  COMPLICATIONS: None  IMPRESSION: Successful right chest tube placement for pneumothorax.   Electronically Signed   By: Marybelle Killings M.D.   On: 06/19/2014 14:17    Anti-infectives: Anti-infectives    Start     Dose/Rate Route Frequency Ordered Stop   06/15/14 0600  ciprofloxacin  (CIPRO) IVPB 400 mg     400 mg 200 mL/hr over 60 Minutes Intravenous On call to O.R. 06/14/14 1405 06/15/14 1153      Assessment/Plan: Right ptx  Will check cxr this am, no air leak, will water seal and repeat cxr in am if ok.  Hopefully discharge tomorrow Use tramadol preferentially over narcotics  [facility] 06/20/2014

## 2014-06-20 NOTE — Evaluation (Signed)
Physical Therapy Evaluation Patient Details Name: Laura Li MRN: [id number] DOB: 1941-01-18 Today's Date: 06/20/2014   History of Present Illness  Patient is a 74 y/o female with Bil PTX s/p port-a-cath placement on 4/6 (for chemotherapy access) requiring 2 chest tubes. On CXR on 4/11, no PTX, now on water seal with only right chest tube. PMH of DVT, HTN, HLD, breast ca s/p lumpectomy and biopsy 04/2014, CKD and anemia.    Clinical Impression  Patient presents with generalized weakness, pain, dyspnea on exertion and balance deficits impacting mobility. Fatigues during activity. Concerned as pt lives alone and her only bedroom is on the second level. Might benefit from using RW for support. Will need to negotiate steps next session. Pt states she may be able to stay with daughter for a few days. Would benefit from skilled PT to improve activity tolerance, endurance, gait, balance and mobility so pt can maximize independence prior to return home.    Follow Up Recommendations Home health PT;Supervision/Assistance - 24 hour    Equipment Recommendations  None recommended by PT    Recommendations for Other Services OT consult     Precautions / Restrictions Precautions Precautions: Fall Precaution Comments: chest tube on water seal Restrictions Weight Bearing Restrictions: No      Mobility  Bed Mobility Overal bed mobility: Modified Independent                Transfers Overall transfer level: Needs assistance Equipment used: None Transfers: Sit to/from Stand Sit to Stand: Min guard         General transfer comment: Increased time for standing. Stood from [employer], from toilet x1, transferred to chair x1 all Min guard due to unsteadiness.   Ambulation/Gait Ambulation/Gait assistance: Min guard Ambulation Distance (Feet): 250 Feet Assistive device: None Gait Pattern/deviations: Step-through pattern;Decreased stride length;Drifts right/left   Gait velocity  interpretation: Below normal speed for age/gender General Gait Details: Pt with slow, guarded and unsteady like gait. Dyspnea present. Sa02 >92% on RA post ambulation. Fatigues easily.  Stairs            Wheelchair Mobility    Modified Rankin (Stroke Patients Only)       Balance Overall balance assessment: Needs assistance Sitting-balance support: Feet supported;No upper extremity supported Sitting balance-Leahy Scale: Fair Sitting balance - Comments: Able to reach down and adjust sock sitting EOB.    Standing balance support: During functional activity Standing balance-Leahy Scale: Fair Standing balance comment: Tolerated pericare in standing without LOB. Mildly unsteady during dynamic standing when washing hands, brushing teeth/hair requiring UE support.                              Pertinent Vitals/Pain Pain Assessment: Faces Faces Pain Scale: Hurts little more Pain Location: right chest from chest tube Pain Descriptors / Indicators: Sore Pain Intervention(s): Monitored during session;Repositioned    Home Living Family/patient expects to be discharged to:: Private residence Living Arrangements: Alone Available Help at Discharge: Family;Available PRN/intermittently Type of Home: House Home Access: Stairs to enter Entrance Stairs-Rails: None Entrance Stairs-Number of Steps: 2 Home Layout: Two level;Bed/bath upstairs Home Equipment: Walker - 2 wheels      Prior Function Level of Independence: Independent               Hand Dominance        Extremity/Trunk Assessment   Upper Extremity Assessment: Defer to OT evaluation  Lower Extremity Assessment: Generalized weakness         Communication   Communication: No difficulties  Cognition Arousal/Alertness: Awake/alert Behavior During Therapy: WFL for tasks assessed/performed Overall Cognitive Status: Within Functional Limits for tasks assessed                       General Comments      Exercises        Assessment/Plan    PT Assessment Patient needs continued PT services  PT Diagnosis Generalized weakness;Difficulty walking   PT Problem List Decreased strength;Cardiopulmonary status limiting activity;Pain;Decreased activity tolerance;Decreased mobility;Decreased balance  PT Treatment Interventions Balance training;Gait training;Stair training;Functional mobility training;Patient/family education;Therapeutic activities;Therapeutic exercise;DME instruction   PT Goals (Current goals can be found in the Care Plan section) Acute Rehab PT Goals Patient Stated Goal: to get stronger PT Goal Formulation: With patient Time For Goal Achievement: 07/04/14 Potential to Achieve Goals: Good    Frequency Min 3X/week   Barriers to discharge Decreased caregiver support Pt lives alone.     Co-evaluation               End of Session Equipment Utilized During Treatment: Gait belt Activity Tolerance: Patient tolerated treatment well;Patient limited by fatigue Patient left: in chair;with call bell/phone within reach Nurse Communication: Mobility status         Time: 2409-7353 PT Time Calculation (min) (ACUTE ONLY): 30 min   Charges:   PT Evaluation $Initial PT Evaluation Tier I: 1 Procedure PT Treatments $Gait Training: 8-22 mins   PT G CodesCandy Sledge A 2014-06-21, 4:15 PM  Candy Sledge, [place], DPT [phone]

## 2014-06-20 NOTE — Progress Notes (Signed)
Referring Physician(s): CCM  Subjective:  Rt chest tube placed 4/10 in IR Pt feels better today Breathing easier CXR no ptx today On water seal now per Dr Donne Hazel  Allergies: Dyazide; Niacin; and Penicillins  Medications: Prior to Admission medications   Medication Sig Start Date End Date Taking? Authorizing Provider  acetaminophen (TYLENOL) 500 MG tablet Take 500 mg by mouth 2 (two) times daily as needed for mild pain.    Yes Historical Provider, MD  atorvastatin (LIPITOR) 20 MG tablet Take 1 tablet (20 mg total) by mouth daily. Patient taking differently: Take 10 mg by mouth daily.  03/26/13  Yes Lisabeth Pick, MD  B Complex-C-Folic Acid (STRESS FORMULA PO) Take 1 tablet by mouth daily.     Yes Historical Provider, MD  Calcium-Vitamin D (CALTRATE 600 PLUS-VIT D PO) Take 1 tablet by mouth 2 (two) times daily.    Yes Historical Provider, MD  Carboxymethylcellulose Sodium (REFRESH LIQUIGEL OP) Apply 1 drop to eye at bedtime as needed (dry eyes).   Yes Historical Provider, MD  esomeprazole (NEXIUM) 40 MG capsule TAKE 1 CAPSULE DAILY Patient taking differently: Take 40 mg by mouth daily. TAKE 1 CAPSULE DAILY 03/31/14  Yes Marin Olp, MD  fexofenadine (ALLEGRA) 180 MG tablet Take 180 mg by mouth daily as needed for allergies.    Yes Historical Provider, MD  ipratropium (ATROVENT) 0.03 % nasal spray Place 2 sprays into the nose every 12 (twelve) hours. 12/21/13  Yes Zenaida Niece, PA-C  Lactobacillus-Inulin ([place]) CHEW Chew 1 capsule by mouth daily.    Yes Historical Provider, MD  levothyroxine (SYNTHROID, LEVOTHROID) 75 MCG tablet Take 1 tablet (75 mcg total) by mouth daily. 03/18/14  Yes Marin Olp, MD  nadolol (CORGARD) 20 MG tablet Take 0.5 tablets (10 mg total) by mouth daily. 12/21/13  Yes Zenaida Niece, PA-C  Polyethyl Glycol-Propyl Glycol (SYSTANE) 0.4-0.3 % SOLN Apply 1 drop to eye daily as needed (for dryness/itching).    Yes Historical  Provider, MD  venlafaxine XR (EFFEXOR-XR) 75 MG 24 hr capsule Take 1 capsule (75 mg total) by mouth daily. 06/21/13 07/24/15 Yes Ricard Dillon, MD  HYDROcodone-acetaminophen (NORCO/VICODIN) 5-325 MG per tablet Take 1 tablet by mouth every 6 (six) hours as needed for moderate pain or severe pain. 05/05/14   Rolm Bookbinder, MD     Vital Signs: BP 160/98 mmHg  Pulse 68  Temp(Src) 97.3 F (36.3 C) (Oral)  Resp 16  Ht 5\' 6"  (1.676 m)  Wt 66.225 kg (146 lb)  BMI 23.58 kg/m2  SpO2 100%  Physical Exam  Pulmonary/Chest: Effort normal and breath sounds normal. She has no wheezes.  Rt chest tube site clean and dry Sl tender No bleeding No leak No air leak in chest tube drain Bloody output in pleur vac CXR no ptx  Water sealed chest tube this am per Dr Donne Hazel    Imaging: Dg Chest Port 1 View  06/20/2014   CLINICAL DATA:  Pneumothorax, right chest tube  EXAM: PORTABLE CHEST - 1 VIEW  COMPARISON:  Radiograph 06/19/2014  FINDINGS: Left power port unchanged. Interval placement of a small bore right chest tube at the apex. No appreciable pneumothorax. There is right basilar atelectasis similar prior.  IMPRESSION: Right chest tube in place without evidence of pneumothorax.   Electronically Signed   By: Suzy Bouchard M.D.   On: 06/20/2014 08:18   Dg Chest Port 1 View  06/19/2014   CLINICAL DATA:  Right-sided pneumothorax. Left-sided port placement on 06/15/2014.  EXAM: PORTABLE CHEST - 1 VIEW  COMPARISON:  06/18/2014.  FINDINGS: Right apical pneumothorax is again identified measuring 2.4 cm in maximum thickness. Previously 2.3 cm. Tiny residual left apical coal pneumothorax is barely visible. Chest wall port a catheter is no with tip in the projection of the SVC. Normal heart size. Small pleural effusions are identified.  IMPRESSION: 1. Slight increase in volume of right apical pneumothorax. 2. Tiny left apical pneumothorax is barely visible.   Electronically Signed   By: Kerby Moors M.D.    On: 06/19/2014 10:32   Dg Chest Port 1 View  06/18/2014   CLINICAL DATA:  Follow-up pneumothorax, Port-A-Cath placement  EXAM: PORTABLE CHEST - 1 VIEW  COMPARISON:  06/17/2014  FINDINGS: Cardiomediastinal silhouette is stable. Left subclavian Port-A-Cath is unchanged in position. There is minimal increased small right apical pneumothorax measures 2 cm in thickness. On the prior exam measures 1.7 cm in thickness. No left pneumothorax. No acute infiltrate or pulmonary edema.  IMPRESSION: Left subclavian Port-A-Cath is unchanged in position. There is minimal increased small right apical pneumothorax measures 2 cm in thickness. On the prior exam measures 1.7 cm in thickness. No left pneumothorax. No acute infiltrate or pulmonary edema.   Electronically Signed   By: Lahoma Crocker M.D.   On: 06/18/2014 10:51   Dg Chest Port 1 View  06/17/2014   CLINICAL DATA:  Pneumothorax post chest tube removal  EXAM: PORTABLE CHEST - 1 VIEW  COMPARISON:  Portable exam 1730 hr compared to 0731 hr  FINDINGS: BILATERAL thoracostomy tubes no longer identified.  LEFT subclavian Port-A-Cath with tip projecting over SVC.  Normal heart size, mediastinal contours and pulmonary vascularity.  RIGHT apex pneumothorax not significantly changed from previous exam.  Tiny LEFT apex pneumothorax unchanged.  Bibasilar atelectasis.  No gross pulmonary infiltrate.  IMPRESSION: Persistent RIGHT and tiny LEFT apex pneumothoraces unchanged from previous exam.  Bibasilar atelectasis.   Electronically Signed   By: Lavonia Dana M.D.   On: 06/17/2014 17:55   Dg Chest Port 1 View  06/17/2014   CLINICAL DATA:  Bilateral chest tubes.  EXAM: PORTABLE CHEST - 1 VIEW  COMPARISON:  Radiograph 06/16/2014  FINDINGS: The a right chest tube has migrated into the subcutaneous tissue of the right chest wall. There is no change in the volume of the right apical pneumothorax which measures 17 mm from the apical chest wall compared to 16 mm on prior.  Left chest tube remains  unchanged in position. The small left apical pneumothorax measures 4 mm from the apical chest wall compared to 6 mm on prior.  Left power port unchanged in position. No pulmonary edema. Left pleural effusion unchanged.  IMPRESSION: 1. Stable biapical pneumothoraces. 2. Right chest tube has migrated out of the pleural space into the right subcutaneous tissue. 3. Left chest tube unchanged. Findings conveyed toMATTHEW WAKEFIELD on 06/17/2014  at07:55.   Electronically Signed   By: Suzy Bouchard M.D.   On: 06/17/2014 07:56   Ct Image Guided Fluid Drain By Catheter  06/19/2014   CLINICAL DATA:  Right pneumothorax  EXAM: CT IMAGE GUIDED FLUID DRAIN BY CATHETER  FLUOROSCOPY TIME:  None  MEDICATIONS AND MEDICAL HISTORY: Versed 0 mg, Fentanyl 50 mcg.  Additional Medications: None.  ANESTHESIA/SEDATION: None  CONTRAST:  None  PROCEDURE: The procedure, risks, benefits, and alternatives were explained to the patient. Questions regarding the procedure were encouraged and answered. The patient understands and consents to the  procedure.  The right anterior upper thorax was prepped with Betadine in a sterile fashion, and a sterile drape was applied covering the operative field. A sterile gown and sterile gloves were used for the procedure.  Under CT guidance, a 19 gauge needle was inserted into the pleural space in the second rib interspace at the mid axillary line and removed over an Amplatz wire. A 12 French dilator followed by a 12 French pigtail drain were advanced over the Amplatz and coiled over the right lung apex. It was sewn to the skin with 0 silk. It was attached to a Pneumovac apparatus.  FINDINGS: Images document placement of a 36 French right apical thoracostomy.  COMPLICATIONS: None  IMPRESSION: Successful right chest tube placement for pneumothorax.   Electronically Signed   By: Marybelle Killings M.D.   On: 06/19/2014 14:17    Labs:  CBC:  Recent Labs  12/21/13 1004 04/22/14 1555 06/14/14 1026  WBC 5.6  5.8 8.5  HGB 12.7 12.0 12.5  HCT 37.6 36.1 37.5  PLT 248.0 239 228    COAGS: No results for input(s): INR, APTT in the last 8760 hours.  BMP:  Recent Labs  12/21/13 1004 04/22/14 1555 06/14/14 1025  NA 137 139 138  K 4.4 5.1 4.1  CL 101  --  100  CO2 29 25 33*  GLUCOSE 83 107 79  BUN 22 21.8 16  CALCIUM 10.6* 9.4 9.9  CREATININE 1.2 1.1 0.97  GFRNONAA  --   --  56*  GFRAA  --   --  65*    LIVER FUNCTION TESTS:  Recent Labs  06/21/13 0944 12/21/13 1004 04/22/14 1555  BILITOT 0.6 0.8 0.42  AST 22 24 20   ALT 17 19 12   ALKPHOS 62 64 80  PROT 7.9 8.5* 7.6  ALBUMIN 4.3 4.2 4.3    Assessment and Plan:  Rt chest tube placed in IR post PTX after PAC placement in OR Doing well Water sealed now Will check cxr in am  Signed: Brandyn Thien A 06/20/2014, 2:21 PM   I spent a total of 15 Minutes in face to face in clinical consultation/evaluation, greater than 50% of which was counseling/coordinating care for Rt chest tube

## 2014-06-21 ENCOUNTER — Inpatient Hospital Stay (HOSPITAL_COMMUNITY): Payer: Medicare Other

## 2014-06-21 MED ORDER — POLYETHYLENE GLYCOL 3350 17 G PO PACK
17.0000 g | PACK | Freq: Once | ORAL | Status: AC
  Administered 2014-06-21: 17 g via ORAL
  Filled 2014-06-21: qty 1

## 2014-06-21 MED ORDER — METHOCARBAMOL 500 MG PO TABS
500.0000 mg | ORAL_TABLET | Freq: Three times a day (TID) | ORAL | Status: DC | PRN
  Administered 2014-06-21: 500 mg via ORAL
  Filled 2014-06-21: qty 1

## 2014-06-21 MED ORDER — HEPARIN SOD (PORK) LOCK FLUSH 100 UNIT/ML IV SOLN
500.0000 [IU] | INTRAVENOUS | Status: AC | PRN
  Administered 2014-06-21: 500 [IU]

## 2014-06-21 MED ORDER — DOCUSATE SODIUM 100 MG PO CAPS
100.0000 mg | ORAL_CAPSULE | Freq: Two times a day (BID) | ORAL | Status: DC
  Administered 2014-06-21 – 2014-06-22 (×3): 100 mg via ORAL
  Filled 2014-06-21 (×3): qty 1

## 2014-06-21 NOTE — Progress Notes (Signed)
Physical Therapy Treatment Patient Details Name: Laura Li MRN: [id number] DOB: 07-27-1940 Today's Date: 06/21/2014    History of Present Illness Patient is a 74 y/o female with Bil PTX s/p port-a-cath placement on 4/6 (for chemotherapy access) requiring 2 chest tubes. On CXR on 4/11, no PTX, now on water seal with only right chest tube. PMH of DVT, HTN, HLD, breast ca s/p lumpectomy and biopsy 04/2014, CKD and anemia.    PT Comments    Patient progressing well with mobility. Balance and gait speed improved with use of RW for support during gait training. Continues to fatigue easily requiring seated rest breaks during activity. 1 LOB during stair negotiation. Daughter will be able to provide 24/7 S for ~1 week at home. Encouraged use of rollator for support. Will follow up tomorrow to practice utilizing rollator for safety. Pt reports pain with deep breathing in chest. Vitals stable. Will continue to follow per current POC.   Follow Up Recommendations  Home health PT;Supervision/Assistance - 24 hour     Equipment Recommendations  Other (comment) (rollator.)    Recommendations for Other Services       Precautions / Restrictions Precautions Precautions: Fall Restrictions Weight Bearing Restrictions: No    Mobility  Bed Mobility Overal bed mobility: Modified Independent                Transfers Overall transfer level: Needs assistance Equipment used: None;Rolling walker (2 wheeled) Transfers: Sit to/from Stand Sit to Stand: Supervision         General transfer comment: Supervision for safety. Stood from [employer] with RW and without RW.  Ambulation/Gait Ambulation/Gait assistance: Min guard Ambulation Distance (Feet): 200 Feet (x2 bouts) Assistive device: Rolling walker (2 wheeled);None Gait Pattern/deviations: Step-through pattern;Decreased stride length;Drifts right/left;Trunk flexed   Gait velocity interpretation: Below normal speed for age/gender General  Gait Details: Ambulated with and without RW. balance improved with use of RW and improved gait speed. Drifting and mild unsteadiness noted without use of RW. Seated rest break.   Stairs Stairs: Yes Stairs assistance: Min assist Stair Management: One rail Right;Step to pattern;Alternating pattern;Forwards Number of Stairs: 12 General stair comments: Mostly Min guard assist for ascending steps, required Mod A upon descent initially secondary to LOB progressing to [place] A for safety.   Wheelchair Mobility    Modified Rankin (Stroke Patients Only)       Balance Overall balance assessment: Needs assistance Sitting-balance support: Feet supported;No upper extremity supported Sitting balance-Leahy Scale: Good     Standing balance support: During functional activity Standing balance-Leahy Scale: Fair                      Cognition Arousal/Alertness: Awake/alert Behavior During Therapy: WFL for tasks assessed/performed Overall Cognitive Status: History of cognitive impairments - at baseline (Some memory deficits noted. Daughter present.)                      Exercises      General Comments General comments (skin integrity, edema, etc.): Daughter present in room. Lengthy disussion with daughter and pt about safety techniques for home and importance of use of AD for balance/support. Discussed need for hands on assist for stair negotiation.      Pertinent Vitals/Pain Pain Assessment: Faces Faces Pain Scale: Hurts little more Pain Location: chest when breathing Pain Descriptors / Indicators: Sore Pain Intervention(s): Monitored during session;Repositioned    Home Living  Prior Function            PT Goals (current goals can now be found in the care plan section) Progress towards PT goals: Progressing toward goals    Frequency  Min 3X/week    PT Plan Current plan remains appropriate    Co-evaluation             End of  Session Equipment Utilized During Treatment: Gait belt Activity Tolerance: Patient tolerated treatment well;Patient limited by fatigue Patient left: in bed;with call bell/phone within reach;with family/visitor present     Time: 2563-8937 PT Time Calculation (min) (ACUTE ONLY): 30 min  Charges:  $Gait Training: 8-22 mins $Self Care/Home Management: 8-22                    G CodesCandy Sledge A 14-Jul-2014, 2:10 PM Candy Sledge, [place], DPT [phone]

## 2014-06-21 NOTE — Progress Notes (Signed)
Porta cath deaccessed. Flushed with 10cc NS followed by Heparin 5ml (100u/ml). No bleeding to site, band aid to site for comfort. Laura Li 

## 2014-06-21 NOTE — Progress Notes (Signed)
6 Days Post-Op  Subjective: Doing better, eating no bm  Objective: Vital signs in last 24 hours: Temp:  [97.3 F (36.3 C)-98.7 F (37.1 C)] 98.7 F (37.1 C) (04/12 0500) Pulse Rate:  [68-83] 72 (04/12 0500) Resp:  [16-17] 16 (04/12 0500) BP: (160-174)/(59-98) 169/59 mmHg (04/12 0500) SpO2:  [91 %-100 %] 91 % (04/12 0500) Last BM Date: 06/14/14  Intake/Output from previous day: 04/11 0701 - 04/12 0700 In: 220 [P.O.:220] Out: 0  Intake/Output this shift:    Resp: bilateral clear breath sounds port site clean and accessed chest tube in place with no leak minimal drainage   Studies/Results: Dg Chest Port 1 View  06/21/2014   CLINICAL DATA:  Right pneumothorax.  EXAM: PORTABLE CHEST - 1 VIEW  COMPARISON:  06/20/2014 and 06/19/2014  FINDINGS: Power port appears in good position. Right-sided chest tube is in good position. No visible residual pneumothorax on the right or left.  Heart size and pulmonary vascularity are normal. Tiny bilateral pleural effusions.  IMPRESSION: No pneumothorax.  Tiny bilateral pleural effusions.   Electronically Signed   By: Lorriane Shire M.D.   On: 06/21/2014 08:03   Dg Chest Port 1 View  06/20/2014   CLINICAL DATA:  Pneumothorax, right chest tube  EXAM: PORTABLE CHEST - 1 VIEW  COMPARISON:  Radiograph 06/19/2014  FINDINGS: Left power port unchanged. Interval placement of a small bore right chest tube at the apex. No appreciable pneumothorax. There is right basilar atelectasis similar prior.  IMPRESSION: Right chest tube in place without evidence of pneumothorax.   Electronically Signed   By: Suzy Bouchard M.D.   On: 06/20/2014 08:18   Ct Image Guided Fluid Drain By Catheter  06/19/2014   CLINICAL DATA:  Right pneumothorax  EXAM: CT IMAGE GUIDED FLUID DRAIN BY CATHETER  FLUOROSCOPY TIME:  None  MEDICATIONS AND MEDICAL HISTORY: Versed 0 mg, Fentanyl 50 mcg.  Additional Medications: None.  ANESTHESIA/SEDATION: None  CONTRAST:  None  PROCEDURE: The procedure,  risks, benefits, and alternatives were explained to the patient. Questions regarding the procedure were encouraged and answered. The patient understands and consents to the procedure.  The right anterior upper thorax was prepped with Betadine in a sterile fashion, and a sterile drape was applied covering the operative field. A sterile gown and sterile gloves were used for the procedure.  Under CT guidance, a 19 gauge needle was inserted into the pleural space in the second rib interspace at the mid axillary line and removed over an Amplatz wire. A 12 French dilator followed by a 12 French pigtail drain were advanced over the Amplatz and coiled over the right lung apex. It was sewn to the skin with 0 silk. It was attached to a Pneumovac apparatus.  FINDINGS: Images document placement of a 88 French right apical thoracostomy.  COMPLICATIONS: None  IMPRESSION: Successful right chest tube placement for pneumothorax.   Electronically Signed   By: Marybelle Killings M.D.   On: 06/19/2014 14:17    Anti-infectives: Anti-infectives    Start     Dose/Rate Route Frequency Ordered Stop   06/15/14 0600  ciprofloxacin (CIPRO) IVPB 400 mg     400 mg 200 mL/hr over 60 Minutes Intravenous On call to O.R. 06/14/14 1405 06/15/14 1153      Assessment/Plan: ptx  I removed chest tube today Pt eval pending for today for dc planning Can dc when ready today or tomorrow and if xray is fine at noon  [facility] 06/21/2014

## 2014-06-22 ENCOUNTER — Other Ambulatory Visit: Payer: Self-pay | Admitting: *Deleted

## 2014-06-22 ENCOUNTER — Inpatient Hospital Stay (HOSPITAL_COMMUNITY): Payer: Medicare Other

## 2014-06-22 ENCOUNTER — Telehealth: Payer: Self-pay | Admitting: Oncology

## 2014-06-22 ENCOUNTER — Ambulatory Visit: Payer: Medicare Other | Admitting: Nurse Practitioner

## 2014-06-22 ENCOUNTER — Telehealth: Payer: Self-pay | Admitting: *Deleted

## 2014-06-22 ENCOUNTER — Other Ambulatory Visit: Payer: Self-pay | Admitting: Nurse Practitioner

## 2014-06-22 MED ORDER — DOCUSATE SODIUM 100 MG PO CAPS: 100.0000 mg | ORAL_CAPSULE | Freq: Two times a day (BID) | ORAL | Status: DC

## 2014-06-22 MED ORDER — POLYETHYLENE GLYCOL 3350 17 G PO PACK
17.0000 g | PACK | Freq: Every day | ORAL | Status: DC
  Administered 2014-06-22: 17 g via ORAL
  Filled 2014-06-22: qty 1

## 2014-06-22 MED ORDER — TRAMADOL HCL 50 MG PO TABS: 50.0000 mg | ORAL_TABLET | Freq: Four times a day (QID) | ORAL | Status: DC | PRN

## 2014-06-22 NOTE — Significant Event (Signed)
Rapid Response Event Note  Overview: Time Called: 0231 Event Type: Respiratory, Other (Comment) (pain)  Called by  Zelphia Cairo, RN at [phone] with pt c/o 10/10 right chest pain radiating across right shoulder and down to lower mid /right back.  Pain increases with inspiration.  Pain not relieved by Robaxin 500 mg po  given at 2130 nor Tramadol  50mg  po given at 0230   Pt had PAC placed on 06/15/14 with bilateral pneumothoraces requiring insertion of Bil CT .  Left CT dc'd on 4/11 and right CT dc'd on 4/12. With f/u xray indicating no pneumo. Initial Focused Assessment:  Upon arrival to room pt is sitting erect in bed, appearing in no distress.  She is alert, oriented times four , conversant stating  Right sided pain is sporatic and sharp in nature rated 10/10.    Pain increases with inspiration of  right chest.   VSS: 97.8  157/94 HR 70 and regular,  RR 20  96% RA sats Bilateral Breath sounds clear in upper fields, decreased in bilat bases with friction rub auscultated in RLL.[name]  Abdomen soft, obese, distended.  LBM 1 week ago..  Passing flatus  Hypo bowel sounds throughout F/U  0340  CXR results pending.  Patient  is pain free at present time          0358  Awaiting CXR results due to [facility] downtime   Pt asleep.  Hand off to [place] , RN                     Will f/u as needed             0413  CXR read with no recurrent  pneumo Interventions: Port CXR:                           Obtain stool softener / laxative order                           ? Gall Bladder dz  workup    Event Summary: Name of Physician Notified: Dt Tsuei at 2055 (on 06/21/14)  Called by Zelphia Cairo, RN 6N    at    Outcome: Stayed in room and stabalized     Laura Li, Laura Li

## 2014-06-22 NOTE — Telephone Encounter (Signed)
Called pt and spoke to her daughter. I asked if they would be willing to moved appt up from 4/22 to 4/20. Both agreed to 06/29/14 @2 :45pm. Pt will see Gentry Fitz, NP to review chemo meds and support prescriptions. Daughter verbalized understanding. No further concerns. Message to be forwarded to Gentry Fitz, NP.

## 2014-06-22 NOTE — Discharge Instructions (Signed)
° ° °  PORT-A-CATH: POST OP INSTRUCTIONS  Always review your discharge instruction sheet given to you by the facility where your surgery was performed.   1. A prescription for pain medication may be given to you upon discharge. Take your pain medication as prescribed, if needed. If narcotic pain medicine is not needed, then you make take acetaminophen (Tylenol) or ibuprofen (Advil) as needed.  2. Take your usually prescribed medications unless otherwise directed. 3. If you need a refill on your pain medication, please contact our office. All narcotic pain medicine now requires a paper prescription.  Phoned in and fax refills are no longer allowed by law.  Prescriptions will not be filled after 5 pm or on weekends.  4. You should follow a light diet for the remainder of the day after your procedure. 5. Most patients will experience some mild swelling and/or bruising in the area of the incision. It may take several days to resolve. 6. It is common to experience some constipation if taking pain medication after surgery. Increasing fluid intake and taking a stool softener (such as Colace) will usually help or prevent this problem from occurring. A mild laxative (Milk of Magnesia or Miralax) should be taken according to package directions if there are no bowel movements after 48 hours.  7. Unless discharge instructions indicate otherwise, you may remove your bandages 48 hours after surgery, and you may shower at that time. You may have steri-strips (small white skin tapes) in place directly over the incision.  These strips should be left on the skin for 7-10 days.  If your surgeon used Dermabond (skin glue) on the incision, you may shower in 24 hours.  The glue will flake off over the next 2-3 weeks.  8. If your port is left accessed at the end of surgery (needle left in port), the dressing cannot get wet and should only by changed by a healthcare professional. When the port is no longer accessed (when the  needle has been removed), follow step 7.   9. ACTIVITIES:  Limit activity involving your arms for the next 72 hours. Do no strenuous exercise or activity for 1 week. You may drive when you are no longer taking prescription pain medication, you can comfortably wear a seatbelt, and you can maneuver your car. 10.You may need to see your doctor in the office for a follow-up appointment.  Please       check with your doctor.  11.When you receive a new Port-a-Cath, you will get a product guide and        ID card.  Please keep them in case you need them.  WHEN TO CALL YOUR DOCTOR [phone]): 1. Fever over 101.0 2. Chills 3. Continued bleeding from incision 4. Increased redness and tenderness at the site 5. Shortness of breath, difficulty breathing   The clinic staff is available to answer your questions during regular business hours. Please dont hesitate to call and ask to speak to one of the nurses or medical assistants for clinical concerns. If you have a medical emergency, go to the nearest emergency room or call 911.  A surgeon from [facility] is always on call at the hospital.     For further information, please visit [email or web address]    1. May remove all dressings later today and do not need any more. May shower later today at home 2. Use heating pad for low back pain in combination with either over the counter medications or the prescribed tramadol

## 2014-06-22 NOTE — Telephone Encounter (Signed)
This RN received call from pt's daughter stating pt may be discharged today from hospital.  Pt is scheduled for visit tomorrow " and do we need to keep that appointment? "  " is the plan to start chemo next week ".  Per discussion [name] feels her mother is physically not up to starting chemotherapy at present.  This RN informed Brion Aliment RN navigator has discussed concerns with MD- and appointments can be rescheduled to a later date per recent inpt status.  Pt had definite surgery on 05/05/2014.  Appointment for follow up will be rescheduled to next week.

## 2014-06-22 NOTE — Progress Notes (Signed)
7 Days Post-Op  Subjective: Low back pain this am and overnight, otherwise ok,no bm yet  Objective: Vital signs in last 24 hours: Temp:  [97.9 F (36.6 C)-98.5 F (36.9 C)] 98.4 F (36.9 C) (04/13 0604) Pulse Rate:  [68-73] 68 (04/13 0604) Resp:  [16] 16 (04/13 0604) BP: (153-163)/(61-88) 154/61 mmHg (04/13 0604) SpO2:  [94 %-96 %] 96 % (04/13 0604) Last BM Date: 06/15/14  Intake/Output from previous day:   Intake/Output this shift:   Lungs clear Low back pain on palpation   Studies/Results: Dg Chest Port 1 View  06/22/2014   CLINICAL DATA:  Encounter for chest tube removal  EXAM: PORTABLE CHEST - 1 VIEW  COMPARISON:  06/21/2014  FINDINGS: Stable positioning of left subclavian porta catheter, tip at the SVC level.  Stable heart size and aortic contours. No recurrence of pneumothorax. Lung volumes remain low, but basilar aeration is improved.  IMPRESSION: 1. No recurrence of pneumothorax. 2. Improving aeration.   Electronically Signed   By: Monte Fantasia M.D.   On: 06/22/2014 03:59   Dg Chest Port 1 View  06/21/2014   CLINICAL DATA:  Evaluate for pneumothorax. Recent RIGHT chest tube removal.  EXAM: PORTABLE CHEST - 1 VIEW  COMPARISON:  Chest radiograph earlier today.  FINDINGS: RIGHT chest tube has been removed. No pneumothorax. Stable LEFT subclavian Port-A-Cath and stable BILATERAL pleural effusions. Low lung volumes.  IMPRESSION: No pneumothorax is evident.   Electronically Signed   By: Rolla Flatten M.D.   On: 06/21/2014 14:21   Dg Chest Port 1 View  06/21/2014   CLINICAL DATA:  Right pneumothorax.  EXAM: PORTABLE CHEST - 1 VIEW  COMPARISON:  06/20/2014 and 06/19/2014  FINDINGS: Power port appears in good position. Right-sided chest tube is in good position. No visible residual pneumothorax on the right or left.  Heart size and pulmonary vascularity are normal. Tiny bilateral pleural effusions.  IMPRESSION: No pneumothorax.  Tiny bilateral pleural effusions.   Electronically  Signed   By: Lorriane Shire M.D.   On: 06/21/2014 08:03    Anti-infectives: Anti-infectives    Start     Dose/Rate Route Frequency Ordered Stop   06/15/14 0600  ciprofloxacin (CIPRO) IVPB 400 mg     400 mg 200 mL/hr over 60 Minutes Intravenous On call to O.R. 06/14/14 1405 06/15/14 1153      Assessment/Plan: Ptx s/p port Resolved Back pain likely due to bed, position, will treat with tramadol as needed and heating pad, no more narcotics or muscle relaxants Can dc today if home health set up with her daughter   [facility] 06/22/2014

## 2014-06-22 NOTE — Progress Notes (Signed)
Physical Therapy Treatment Patient Details Name: Laura Li MRN: [id number] DOB: November 28, 1940 Today's Date: 06/22/2014    History of Present Illness Patient is a 74 y/o female with Bil PTX s/p port-a-cath placement on 4/6 (for chemotherapy access) requiring 2 chest tubes. On CXR on 4/11, no PTX, now on water seal with only right chest tube. PMH of DVT, HTN, HLD, breast ca s/p lumpectomy and biopsy 04/2014, CKD and anemia.    PT Comments    Patient progressing well with mobility. Balance much improved from prior session using rollator for support. Education and instruction provided on how to properly utilize rollator brakes and seat by parking against wall. Pt with a few muscle spasms during gait training. Will continue to follow per current POC.   Follow Up Recommendations  Home health PT;Supervision/Assistance - 24 hour     Equipment Recommendations  Other (comment)    Recommendations for Other Services       Precautions / Restrictions Precautions Precautions: Fall Restrictions Weight Bearing Restrictions: No    Mobility  Bed Mobility               General bed mobility comments: Sitting in chair upon PT arrival.   Transfers Overall transfer level: Needs assistance Equipment used: 4-wheeled walker Transfers: Sit to/from Stand Sit to Stand: Supervision         General transfer comment: Supervision for safety. Stood from low couch x1.  Ambulation/Gait Ambulation/Gait assistance: Supervision Ambulation Distance (Feet): 250 Feet Assistive device: 4-wheeled walker Gait Pattern/deviations: Step-through pattern;Decreased stride length;Trunk flexed   Gait velocity interpretation: Below normal speed for age/gender General Gait Details: Steady gait with use of rollator.  Required cues for how to lock/unlock brakes and safety techniques when using seat.   Stairs Stairs: Yes Stairs assistance: Min guard Stair Management: One rail Right;Alternating pattern;Step to  pattern Number of Stairs: 12 General stair comments: Alternating pattern ascending steps and step to pattern descending steps.   Wheelchair Mobility    Modified Rankin (Stroke Patients Only)       Balance Overall balance assessment: Needs assistance Sitting-balance support: Feet supported;No upper extremity supported Sitting balance-Leahy Scale: Good     Standing balance support: During functional activity Standing balance-Leahy Scale: Fair                      Cognition Arousal/Alertness: Awake/alert Behavior During Therapy: WFL for tasks assessed/performed Overall Cognitive Status: History of cognitive impairments - at baseline                      Exercises      General Comments        Pertinent Vitals/Pain Pain Assessment: Faces Faces Pain Scale: Hurts even more Pain Location: muscle spasms in right flank Pain Descriptors / Indicators: Sharp;Spasm Pain Intervention(s): Monitored during session;Repositioned    Home Living                      Prior Function            PT Goals (current goals can now be found in the care plan section) Progress towards PT goals: Progressing toward goals    Frequency  Min 3X/week    PT Plan Current plan remains appropriate    Co-evaluation             End of Session Equipment Utilized During Treatment: Gait belt Activity Tolerance: Patient tolerated treatment well Patient left: in chair;with call bell/phone  within reach;with family/visitor present     Time: 1033-1052 PT Time Calculation (min) (ACUTE ONLY): 19 min  Charges:  $Gait Training: 8-22 mins                    G CodesCandy Sledge A 06-27-14, 1:54 PM Candy Sledge, [place], DPT [phone]

## 2014-06-22 NOTE — Telephone Encounter (Signed)
Confirm appointment for 04/22

## 2014-06-23 ENCOUNTER — Other Ambulatory Visit: Payer: Self-pay | Admitting: General Surgery

## 2014-06-23 ENCOUNTER — Telehealth: Payer: Self-pay | Admitting: Nurse Practitioner

## 2014-06-23 ENCOUNTER — Ambulatory Visit: Payer: Medicare Other | Admitting: Nurse Practitioner

## 2014-06-23 NOTE — Telephone Encounter (Signed)
Confirm appointment for 04/20.

## 2014-06-25 NOTE — Discharge Summary (Signed)
Physician Discharge Summary  Patient ID: ARVILLA SALADA MRN: [id number] DOB/AGE: [birth year] 76 y.o.  Admit date: 06/15/2014 Discharge date: 06/25/2014  Admission Diagnoses: Breast cancer Bilateral ptx  Discharge Diagnoses:  Active Problems:   Pneumothorax   Chest tube in place   Discharged Condition: good  Hospital Course: 24 yof who I have treated for breast cancer. She had port placed and developed bilateral pneumothoraces.  I placed two chest tubes and the left resolved quickly.  I removed this tube and the lung remained up. Her right sided tube was pulled inadvertently and she developed recurrence. IR placed small tube and this resolved as well.  Her lungs have remained up and she is off oxygen. I had PT see her and she was deemed candidate for home PT.  She is certainly weaker right now but is ready for discharge. We have delayed chemotherapy due to this.  Consults: IR, PT  Significant Diagnostic Studies: none  Treatments: procedures: bilateral chest tubes    Disposition: 01-Home or Self Care     Medication List    TAKE these medications        acetaminophen 500 MG tablet  Commonly known as:  TYLENOL  Take 500 mg by mouth 2 (two) times daily as needed for mild pain.     atorvastatin 20 MG tablet  Commonly known as:  LIPITOR  Take 1 tablet (20 mg total) by mouth daily.     CALTRATE 600 PLUS-VIT D PO  Take 1 tablet by mouth 2 (two) times daily.     CULTURELLE DIGESTIVE HEALTH Chew  Chew 1 capsule by mouth daily.     docusate sodium 100 MG capsule  Commonly known as:  COLACE  Take 1 capsule (100 mg total) by mouth 2 (two) times daily.     esomeprazole 40 MG capsule  Commonly known as:  NEXIUM  TAKE 1 CAPSULE DAILY     fexofenadine 180 MG tablet  Commonly known as:  ALLEGRA  Take 180 mg by mouth daily as needed for allergies.     HYDROcodone-acetaminophen 5-325 MG per tablet  Commonly known as:  NORCO/VICODIN  Take 1 tablet by mouth every 6 (six) hours  as needed for moderate pain or severe pain.     ipratropium 0.03 % nasal spray  Commonly known as:  ATROVENT  Place 2 sprays into the nose every 12 (twelve) hours.     levothyroxine 75 MCG tablet  Commonly known as:  SYNTHROID, LEVOTHROID  Take 1 tablet (75 mcg total) by mouth daily.     nadolol 20 MG tablet  Commonly known as:  CORGARD  Take 0.5 tablets (10 mg total) by mouth daily.     REFRESH LIQUIGEL OP  Apply 1 drop to eye at bedtime as needed (dry eyes).     STRESS FORMULA PO  Take 1 tablet by mouth daily.     SYSTANE 0.4-0.3 % Soln  Generic drug:  Polyethyl Glycol-Propyl Glycol  Apply 1 drop to eye daily as needed (for dryness/itching).     traMADol 50 MG tablet  Commonly known as:  ULTRAM  Take 1 tablet (50 mg total) by mouth every 6 (six) hours as needed for moderate pain or severe pain.     venlafaxine XR 75 MG 24 hr capsule  Commonly known as:  EFFEXOR-XR  Take 1 capsule (75 mg total) by mouth daily.           Follow-up Information    Follow up with Rolm Bookbinder, MD  In 2 weeks.   Specialty:  General Surgery   Contact information:   [address] [place] [address] [phone]       Signed: Rolm Bookbinder 06/25/2014, 3:18 PM

## 2014-06-27 ENCOUNTER — Other Ambulatory Visit: Payer: Medicare Other

## 2014-06-27 ENCOUNTER — Telehealth: Payer: Self-pay | Admitting: *Deleted

## 2014-06-27 ENCOUNTER — Ambulatory Visit: Payer: Medicare Other

## 2014-06-27 ENCOUNTER — Other Ambulatory Visit: Payer: Self-pay | Admitting: *Deleted

## 2014-06-27 MED ORDER — LORAZEPAM 0.5 MG PO TABS: ORAL_TABLET | ORAL | Status: DC

## 2014-06-27 NOTE — Telephone Encounter (Signed)
Returned call to pt concerning the message she left this am . No answer but left a message for pt to call this nurse back. I will attempt to call again before I leave from work.

## 2014-06-27 NOTE — Telephone Encounter (Signed)
Called pt's daughter after unsuccessful attempt trying to reach pt. I discussed with the daughter about pt's anxiety. Daughter said her mother was feeling overwhelmed with everything that happened with the port placement and hospitalization for the pneumothorax. Her daughter says "she seem to be in a slump and apprehensive about starting the chemo". Pt will be here to see [place] on 06/29/14 , Kim(pt's daughter) will be coming also. I reassured her that we will come up with a plan that will hopefully take some of her anxiety away. Treatment is to start in May. Message to be forwarded to Gentry Fitz, NP.

## 2014-06-27 NOTE — Telephone Encounter (Signed)
Pt called to this RN to state noted " feeling more anxious since being in the hospital ".  Per discussion pt states she is currently on effexor.  Reviewed above with MD and order obtained for ativan.  called and informed pt.  Prescription called to pt's pharmacy.

## 2014-06-29 ENCOUNTER — Ambulatory Visit (HOSPITAL_BASED_OUTPATIENT_CLINIC_OR_DEPARTMENT_OTHER): Payer: Medicare Other | Admitting: Nurse Practitioner

## 2014-06-29 ENCOUNTER — Encounter: Payer: Self-pay | Admitting: Nurse Practitioner

## 2014-06-29 VITALS — BP 159/78 | HR 77 | Temp 98.0°F | Resp 18 | Ht 66.0 in | Wt 144.2 lb

## 2014-06-29 DIAGNOSIS — C50912 Malignant neoplasm of unspecified site of left female breast: Secondary | ICD-10-CM

## 2014-06-29 DIAGNOSIS — C50812 Malignant neoplasm of overlapping sites of left female breast: Secondary | ICD-10-CM

## 2014-06-29 DIAGNOSIS — F418 Other specified anxiety disorders: Secondary | ICD-10-CM | POA: Diagnosis not present

## 2014-06-29 NOTE — Addendum Note (Signed)
Addended by: Marcelino Duster on: 06/29/2014 06:19 PM   Modules accepted: Orders

## 2014-06-29 NOTE — Progress Notes (Signed)
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 Non-of the 25 lymph nodes removed from the right axilla were involved. The tumor was estrogen and progesterone receptor positive, HER-2 not amplified. She was treated adjuvantly with doxorubicin and cyclophosphamide 4, then received 5 years of antiestrogen therapy (initially tamoxifen, later letrozole). There has been no evidence of disease recurrence on the right.  More recently, Laura Li had routine left screening mamm[facility]. This showed a suspicious area of architectural distortion in the inferior portion of the left breast. On 76/73/4193 Laura Li underwent left diagnostic mammography with ultrasonography. Spot compression views confirmed the presence 7 area of distortion in the lower central left breast, which was not palpable by physical exam (there was a prior biopsy scar in the upper outer quadrant of the left breast). Ultrasound confirmed a hypoechoic spiculated mass at the 6:00 location in the left breast, measuring maximally 1.3 cm. Sonography of the left axilla was negative.  Biopsy of the left breast mass in question 120 10/29/2014 showed ([id number]) an invasive breast cancer with squamous features. It was estrogen and progesterone receptor negative. There was no HER-2 amplification, the signals ratio being 0.82 and the number per cell 1.80. The  MIB-1 was 10%.  The patient has met with Laura Li in surgery and Laura Li in radiation oncology. The patient understands there is no survival difference between lumpectomy and radiation compared with mastectomy. The patient will need sentinel lymph node sampling with either of those procedures. If she does have a lumpectomy, she will benefit from adjuvant radiation. The patient has been set up for genetics testing and this may affect her ultimate surgical decision.  Her subsequent history is as detailed below  INTERVAL HISTORY: Laura Li returns today for follow-up of her breast cancer, accompanied by her daughter Laura Li. She was to start adjuvant chemotherapy on 4/11, but during her port placement she developed bilateral pneumothoraxes, and needed chest tubes  tubes. She was discharged from the hospital on 4/13. Chemo was delayed to allow for recovery. Since the patient has been discharged, she complains of increased depressive episodes and anxiety that were not present before surgery or during her hospitalization. Her daughter can tell that she "is not the same person", similar to the way she acted when trying to wean off venlafaxine in the past. She was unable to do so and continues now on $Remo'75mg'dmFhc$  daily, her dose for several years now. She has tried to use ativan, but doesn't like the way it makes her feel.   REVIEW OF SYSTEMS: Laura Li denies fevers, chills, nausea, or vomiting. She had 9 days of constipation while on narcotics for pain, but now is on tylenol alone and this has subsided. She is eating and drinking well. She denies shortness of breath, chest pain, cough, or palpitations. She has no bleeding. A detailed review of systems is otherwise stable.  PAST MEDICAL HISTORY: Past Medical History  Diagnosis Date  . OA (osteoarthritis)   .  History of DVT (deep vein thrombosis)   . Hyperlipidemia   . Hypertension   . Hypothyroidism   . HX: breast cancer     melanoma  . Melanoma   . Eczema     . Arthritis   . Esophageal stricture   . GERD (gastroesophageal reflux disease)   . PONV (postoperative nausea and vomiting)   . Wears glasses   . Depression     states no depression  . Chronic kidney disease     some renal impairment from meds  . Headache   . Anemia     from medicaions  . Heart murmur     states it is benign told 30-40 years ago  . Pneumothorax on right 06/15/2014    PAST SURGICAL HISTORY: Past Surgical History  Procedure Laterality Date  . Appendectomy  1967  . Melanoma excision  2004    right side of face  . 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 Total abdominal hysterectomy  1992    including ovaries  . Tonsillectomy and adenoidectomy  1949  . Lipoma excision  1978    right breast  . Mastectomy  1998    right-nodes out  . Breast lumpectomy      left breast-benign  . Breast surgery  1977    removal of calcified milk gland right breast  . Colonoscopy    . Radioactive seed guided mastectomy with axillary sentinel lymph node biopsy Left 05/05/2014    Procedure: RADIOACTIVE SEED GUIDED LEFT LUMPECTOMY WITH AXILLARY SENTINEL LYMPH NODE BIOPSY;  Surgeon: Laura Bookbinder, MD;  Location: [facility];  Service: General;  Laterality: Left;  . Chest tube insertion  06/15/2014  . Portacath placement Left 06/15/2014    Procedure: INSERTION PORT-A-CATH;  Surgeon: Laura Bookbinder, MD;  Location: [facility];  Service: General;  Laterality: Left;  . Chest tube insertion Bilateral 06/15/2014    Procedure: CHEST TUBE INSERTION;  Surgeon: Laura Bookbinder, MD;  Location: [facility];  Service: General;  Laterality: Bilateral;    FAMILY HISTORY Family History  Problem Relation Age of Onset  . Heart disease Father 12    smoker  . Endometrial cancer Mother 73  . Breast cancer Cousin     x 3 cousins with breast cancer (one at 47, one at 45 and one at 46)  . Lung cancer Paternal Aunt     smoker  . Brain cancer Maternal Aunt   . Colon cancer Neg Hx   .  Diabetes Maternal Aunt     ???  . Breast cancer Maternal Aunt 51  The patient's father died at the age of 73 from a myocardial infarction. The patient's mother died at the age of 59 from metastatic endometrial cancer which had been diagnosed 2 years before. The patient had no brothers, one sister. The patient's mother's sister was diagnosed with breast cancer but the patient does not know at what age. A second maternal aunt was diagnosed with brain cancer. The patient has 3 cousins on her mother's side diagnosed with breast cancer, one of them under the age of 62. On the father's side there is a history of lung and cervical cancer  GYNECOLOGIC HISTORY:  No LMP recorded. Patient has had a hysterectomy. Menarche age 37, first live birth age 60. The patient is GX P1. She underwent hysterectomy with bilateral salpingo-oophorectomy in 1991. She used hormone replacement for approximately 7 years until 1998, when she had her right-sided breast cancer.  She used oral contraceptives for approximately one year with no complications. 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 She is widowed and lives alone with no pets. Her main hobby is painting. Her daughter Faith Rogue lives in [place] and is also a homemaker. The patient has 2 grandchildren. She attends wholly [employer]. The patient's daughter Laura Li is her healthcare power of attorney. Laura Li can be reached at [phone]   HEALTH MAINTENANCE: History  Substance Use Topics  . Smoking status: Former Smoker -- 0.75 packs/day for 3 years    Types: Cigarettes    Quit date: 03/11/1969  . Smokeless tobacco: Never Used  . Alcohol Use: 0.0 oz/week    0 Standard drinks or equivalent per week     Comment: occasional wine     Colonoscopy: 20/10/John  Henrene Pastor  TDH:RCBULA post hysterectomy  Bone density:2014/osteopenia  Lipid panel:  Allergies  Allergen Reactions  . Dyazide [Hydrochlorothiazide W-Triamterene]     Presumption: drug induced vasculitis  . Niacin Other (See Comments)    flushing  . Penicillins Diarrhea and Nausea And Vomiting    Current Outpatient Prescriptions  Medication Sig Dispense Refill  . acetaminophen (TYLENOL) 500 MG tablet Take 500 mg by mouth 2 (two) times daily as needed for mild pain.     [name] atorvastatin (LIPITOR) 20 MG tablet Take 1 tablet (20 mg total) by mouth daily. (Patient taking differently: Take 10 mg by mouth daily. ) 90 tablet 3  . B Complex-C-Folic Acid (STRESS FORMULA PO) Take 1 tablet by mouth daily.      . Calcium-Vitamin D (CALTRATE 600 PLUS-VIT D PO) Take 1 tablet by mouth 2 (two) times daily.     . Carboxymethylcellulose Sodium (REFRESH LIQUIGEL OP) Apply 1 drop to eye at bedtime as needed (dry eyes).    [name] esomeprazole (NEXIUM) 40 MG capsule TAKE 1 CAPSULE DAILY (Patient taking differently: Take 40 mg by mouth daily. TAKE 1 CAPSULE DAILY) 90 capsule 3  . ipratropium (ATROVENT) 0.03 % nasal spray Place 2 sprays into the nose every 12 (twelve) hours. 180 mL 4  . Lactobacillus-Inulin (CULTURELLE DIGESTIVE HEALTH) CHEW Chew 1 capsule by mouth daily.     [name] levothyroxine (SYNTHROID, LEVOTHROID) 75 MCG tablet Take 1 tablet (75 mcg total) by mouth daily. 90 tablet 2  . nadolol (CORGARD) 20 MG tablet Take 0.5 tablets (10 mg total) by mouth daily. 90 tablet 1  . Polyethyl Glycol-Propyl Glycol (SYSTANE) 0.4-0.3 % SOLN Apply 1 drop to eye daily as needed (for dryness/itching).     . venlafaxine XR (EFFEXOR-XR) 75 MG 24 hr capsule Take 1 capsule (75 mg total) by mouth daily. 90 capsule 3  . CVS BISACODYL 10 MG suppository   0  . docusate sodium (COLACE) 100 MG capsule Take 1 capsule (100 mg total) by mouth 2 (two) times daily. (Patient not taking: Reported on 06/29/2014) 40 capsule 0  . fexofenadine (ALLEGRA)  180 MG tablet Take 180 mg by mouth daily as needed for allergies.     [name] LORazepam (ATIVAN) 0.5 MG tablet 1/2 to 1 tab po q 8 hours prn (Patient not taking: Reported on 06/29/2014) 30 tablet 1  . ondansetron (ZOFRAN) 8 MG tablet   0  . traMADol (ULTRAM) 50 MG tablet Take 1 tablet (50 mg total) by mouth  every 6 (six) hours as needed for moderate pain or severe pain. (Patient not taking: Reported on 06/29/2014) 20 tablet 0   No current facility-administered medications for this visit.    OBJECTIVE: Older white woman who appears well Filed Vitals:   06/29/14 1454  BP: 159/78  Pulse: 77  Temp: 98 F (36.7 C)  Resp: 18     Body mass index is 23.29 kg/(m^2).    ECOG FS:0 - Asymptomatic  Skin: warm, dry  HEENT: sclerae anicteric, conjunctivae pink, oropharynx clear. No thrush or mucositis.  Lymph Nodes: No cervical or supraclavicular lymphadenopathy  Lungs: clear to auscultation bilaterally, no rales, wheezes, or rhonci  Heart: regular rate and rhythm  Abdomen: round, soft, non tender, positive bowel sounds  Musculoskeletal: No focal spinal tenderness, no peripheral edema  Neuro: non focal, well oriented, depressed affect  Breasts: deferred  LAB RESULTS:  CMP     Component Value Date/Time   NA 138 06/14/2014 1025   NA 139 04/22/2014 1555   K 4.1 06/14/2014 1025   K 5.1 04/22/2014 1555   CL 100 06/14/2014 1025   CO2 33* 06/14/2014 1025   CO2 25 04/22/2014 1555   GLUCOSE 79 06/14/2014 1025   GLUCOSE 107 04/22/2014 1555   GLUCOSE 88 02/06/2006 1059   BUN 16 06/14/2014 1025   BUN 21.8 04/22/2014 1555   CREATININE 0.97 06/14/2014 1025   CREATININE 1.1 04/22/2014 1555   CALCIUM 9.9 06/14/2014 1025   CALCIUM 9.4 04/22/2014 1555   PROT 7.6 04/22/2014 1555   PROT 8.5* 12/21/2013 1004   ALBUMIN 4.3 04/22/2014 1555   ALBUMIN 4.2 12/21/2013 1004   AST 20 04/22/2014 1555   AST 24 12/21/2013 1004   ALT 12 04/22/2014 1555   ALT 19 12/21/2013 1004   ALKPHOS 80 04/22/2014 1555    ALKPHOS 64 12/21/2013 1004   BILITOT 0.42 04/22/2014 1555   BILITOT 0.8 12/21/2013 1004   GFRNONAA 56* 06/14/2014 1025   GFRAA 65* 06/14/2014 1025    INo results found for: SPEP, UPEP  Lab Results  Component Value Date   WBC 8.5 06/14/2014   NEUTROABS 3.3 04/22/2014   HGB 12.5 06/14/2014   HCT 37.5 06/14/2014   MCV 89.7 06/14/2014   PLT 228 06/14/2014      Chemistry      Component Value Date/Time   NA 138 06/14/2014 1025   NA 139 04/22/2014 1555   K 4.1 06/14/2014 1025   K 5.1 04/22/2014 1555   CL 100 06/14/2014 1025   CO2 33* 06/14/2014 1025   CO2 25 04/22/2014 1555   BUN 16 06/14/2014 1025   BUN 21.8 04/22/2014 1555   CREATININE 0.97 06/14/2014 1025   CREATININE 1.1 04/22/2014 1555      Component Value Date/Time   CALCIUM 9.9 06/14/2014 1025   CALCIUM 9.4 04/22/2014 1555   ALKPHOS 80 04/22/2014 1555   ALKPHOS 64 12/21/2013 1004   AST 20 04/22/2014 1555   AST 24 12/21/2013 1004   ALT 12 04/22/2014 1555   ALT 19 12/21/2013 1004   BILITOT 0.42 04/22/2014 1555   BILITOT 0.8 12/21/2013 1004       No results found for: LABCA2  No components found for: LABCA125  No results for input(s): INR in the last 168 hours.  Urinalysis    Component Value Date/Time   COLORURINE YELLOW 05/17/2013 1330   APPEARANCEUR CLEAR 05/17/2013 1330   LABSPEC 1.025 05/17/2013 1330   PHURINE 5.5 05/17/2013 1330   GLUCOSEU NEGATIVE 05/17/2013  [place] 05/17/2013 1330   BILIRUBINUR n 06/21/2013 1115   BILIRUBINUR NEGATIVE 05/17/2013 1330   KETONESUR NEGATIVE 05/17/2013 1330   PROTEINUR n 06/21/2013 1115   PROTEINUR NEGATIVE 05/17/2013 1330   UROBILINOGEN 0.2 06/21/2013 1115   UROBILINOGEN 0.2 05/17/2013 1330   NITRITE n 06/21/2013 1115   NITRITE NEGATIVE 05/17/2013 1330   LEUKOCYTESUR Trace 06/21/2013 1115    STUDIES: Dg Chest 2 View  06/16/2014   ADDENDUM REPORT: 06/16/2014 14:32  ADDENDUM: Critical Value/emergent results were called by telephone at the time  of interpretation on 06/16/2014 at 2:32 pm to [facility] , who verbally acknowledged these results.   Electronically Signed   By: Lowella Grip III M.D.   On: 06/16/2014 14:32   06/16/2014   CLINICAL DATA:  Shortness of breath. Prior documentation of pneumothorax.  EXAM: CHEST  2 VIEW  COMPARISON:  Study obtained earlier in the day  FINDINGS: The small left apical pneumothorax is stable. Chest tube remains in place on the left. Port-A-Cath tip is in the superior vena cava. There is a chest tube on the right which has withdrawn several cm. There is a right pneumothorax which is increased in size compared to earlier in the day.  There is mild left base atelectasis. Lungs are otherwise clear. Heart size and pulmonary vascularity are normal. No adenopathy.  IMPRESSION: Pneumothorax on the right has increased in size after right-sided chest tube has withdrawn several cm. No change in position of left chest tube with small left apical pneumothorax. Lungs are clear except for mild medial left base atelectasis. No change in cardiac silhouette.  Electronically Signed: By: Lowella Grip III M.D. On: 06/16/2014 14:20   Dg Chest Port 1 View  06/22/2014   CLINICAL DATA:  Encounter for chest tube removal  EXAM: PORTABLE CHEST - 1 VIEW  COMPARISON:  06/21/2014  FINDINGS: Stable positioning of left subclavian porta catheter, tip at the SVC level.  Stable heart size and aortic contours. No recurrence of pneumothorax. Lung volumes remain low, but basilar aeration is improved.  IMPRESSION: 1. No recurrence of pneumothorax. 2. Improving aeration.   Electronically Signed   By: Monte Fantasia M.D.   On: 06/22/2014 03:59   Dg Chest Port 1 View  06/21/2014   CLINICAL DATA:  Evaluate for pneumothorax. Recent RIGHT chest tube removal.  EXAM: PORTABLE CHEST - 1 VIEW  COMPARISON:  Chest radiograph earlier today.  FINDINGS: RIGHT chest tube has been removed. No pneumothorax. Stable LEFT subclavian Port-A-Cath and stable  BILATERAL pleural effusions. Low lung volumes.  IMPRESSION: No pneumothorax is evident.   Electronically Signed   By: Rolla Flatten M.D.   On: 06/21/2014 14:21   Dg Chest Port 1 View  06/21/2014   CLINICAL DATA:  Right pneumothorax.  EXAM: PORTABLE CHEST - 1 VIEW  COMPARISON:  06/20/2014 and 06/19/2014  FINDINGS: Power port appears in good position. Right-sided chest tube is in good position. No visible residual pneumothorax on the right or left.  Heart size and pulmonary vascularity are normal. Tiny bilateral pleural effusions.  IMPRESSION: No pneumothorax.  Tiny bilateral pleural effusions.   Electronically Signed   By: Lorriane Shire M.D.   On: 06/21/2014 08:03   Dg Chest Port 1 View  06/20/2014   CLINICAL DATA:  Pneumothorax, right chest tube  EXAM: PORTABLE CHEST - 1 VIEW  COMPARISON:  Radiograph 06/19/2014  FINDINGS: Left power port unchanged. Interval placement of a small bore right chest tube at the  apex. No appreciable pneumothorax. There is right basilar atelectasis similar prior.  IMPRESSION: Right chest tube in place without evidence of pneumothorax.   Electronically Signed   By: Suzy Bouchard M.D.   On: 06/20/2014 08:18   Dg Chest Port 1 View  06/19/2014   CLINICAL DATA:  Right-sided pneumothorax. Left-sided port placement on 06/15/2014.  EXAM: PORTABLE CHEST - 1 VIEW  COMPARISON:  06/18/2014.  FINDINGS: Right apical pneumothorax is again identified measuring 2.4 cm in maximum thickness. Previously 2.3 cm. Tiny residual left apical coal pneumothorax is barely visible. Chest wall port a catheter is no with tip in the projection of the SVC. Normal heart size. Small pleural effusions are identified.  IMPRESSION: 1. Slight increase in volume of right apical pneumothorax. 2. Tiny left apical pneumothorax is barely visible.   Electronically Signed   By: Kerby Moors M.D.   On: 06/19/2014 10:32   Dg Chest Port 1 View  06/18/2014   CLINICAL DATA:  Follow-up pneumothorax, Port-A-Cath placement   EXAM: PORTABLE CHEST - 1 VIEW  COMPARISON:  06/17/2014  FINDINGS: Cardiomediastinal silhouette is stable. Left subclavian Port-A-Cath is unchanged in position. There is minimal increased small right apical pneumothorax measures 2 cm in thickness. On the prior exam measures 1.7 cm in thickness. No left pneumothorax. No acute infiltrate or pulmonary edema.  IMPRESSION: Left subclavian Port-A-Cath is unchanged in position. There is minimal increased small right apical pneumothorax measures 2 cm in thickness. On the prior exam measures 1.7 cm in thickness. No left pneumothorax. No acute infiltrate or pulmonary edema.   Electronically Signed   By: Lahoma Crocker M.D.   On: 06/18/2014 10:51   Dg Chest Port 1 View  06/17/2014   CLINICAL DATA:  Pneumothorax post chest tube removal  EXAM: PORTABLE CHEST - 1 VIEW  COMPARISON:  Portable exam 1730 hr compared to 0731 hr  FINDINGS: BILATERAL thoracostomy tubes no longer identified.  LEFT subclavian Port-A-Cath with tip projecting over SVC.  Normal heart size, mediastinal contours and pulmonary vascularity.  RIGHT apex pneumothorax not significantly changed from previous exam.  Tiny LEFT apex pneumothorax unchanged.  Bibasilar atelectasis.  No gross pulmonary infiltrate.  IMPRESSION: Persistent RIGHT and tiny LEFT apex pneumothoraces unchanged from previous exam.  Bibasilar atelectasis.   Electronically Signed   By: Lavonia Dana M.D.   On: 06/17/2014 17:55   Dg Chest Port 1 View  06/17/2014   CLINICAL DATA:  Bilateral chest tubes.  EXAM: PORTABLE CHEST - 1 VIEW  COMPARISON:  Radiograph 06/16/2014  FINDINGS: The a right chest tube has migrated into the subcutaneous tissue of the right chest wall. There is no change in the volume of the right apical pneumothorax which measures 17 mm from the apical chest wall compared to 16 mm on prior.  Left chest tube remains unchanged in position. The small left apical pneumothorax measures 4 mm from the apical chest wall compared to 6 mm on  prior.  Left power port unchanged in position. No pulmonary edema. Left pleural effusion unchanged.  IMPRESSION: 1. Stable biapical pneumothoraces. 2. Right chest tube has migrated out of the pleural space into the right subcutaneous tissue. 3. Left chest tube unchanged. Findings conveyed toMATTHEW WAKEFIELD on 06/17/2014  at07:55.   Electronically Signed   By: Suzy Bouchard M.D.   On: 06/17/2014 07:56   Dg Chest Port 1 View  06/16/2014   CLINICAL DATA:  Reassess chest tubes for treatment of pneumothorax  EXAM: PORTABLE CHEST - 1 VIEW  COMPARISON:  Portable chest x-rays of June 15, 2014  FINDINGS: Bilateral chest tubes remain in place. The right lung is well-expanded and clear. On the left a tiny stable apical pneumothorax is demonstrated. There is no mediastinal shift. There is no significant pleural effusion. The cardiac silhouette is top-normal in size. The pulmonary vascularity is not engorged. The bony thorax is unremarkable. The heart size and mediastinal contours are within normal limits. Both lungs are clear. The visualized skeletal structures are unremarkable.  IMPRESSION: Residual tiny left apical pneumothorax amounting to less than 5% of the lung volume.   Electronically Signed   By: David  Martinique   On: 06/16/2014 07:58   Dg Chest Port 1 View  06/15/2014   CLINICAL DATA:  Bilateral chest tube placement for pneumothoraces.  EXAM: PORTABLE CHEST - 1 VIEW  COMPARISON:  06/15/2014 at 6:26 p.m.  FINDINGS: Left-sided power injectable Port-A-Cath observed, tip projecting over the SVC.  Bilateral chest tubes are present. 5% residual left apical pneumothorax. No visible residual right pneumothorax.  Mild enlargement of the cardiopericardial silhouette.  No edema.  IMPRESSION: 1. Bilateral chest tubes of in place. Tiny residual left apical pneumothorax ; right pneumothorax is no longer present. 2. Mild enlargement of the cardiopericardial silhouette.   Electronically Signed   By: Van Clines M.D.   On:  06/15/2014 21:24   Dg Chest Port 1 View  06/15/2014   CLINICAL DATA:  Followup pneumothorax after port placement.  EXAM: PORTABLE CHEST - 1 VIEW  COMPARISON:  06/15/2014  FINDINGS: Increased small left apical pneumothorax, now 10-20%. There is a new, large right apical pneumothorax with mild diaphragm depression but no mediastinal shift.  Normal heart size and aortic contours.  Unremarkable positioning of left subclavian porta catheter.  Critical Value/emergent results were called by telephone at the time of interpretation on 06/15/2014 at 6:56 pm to Dr. Rolm Li , who verbally acknowledged these results.  IMPRESSION: 1. New large right pneumothorax with early diaphragm depression. 2. Enlarging left pneumothorax, now estimated 10 -20%.   Electronically Signed   By: Monte Fantasia M.D.   On: 06/15/2014 18:57   Dg Chest Port 1 View  06/15/2014   CLINICAL DATA:  Initial encounter for Port-A-Cath placement.  EXAM: PORTABLE CHEST - 1 VIEW  COMPARISON:  12/10/2012.  FINDINGS: 13 27 hrs. left Port-A-Cath is new in the interval with tip position overlying the mid SVC. Prominent leftward patient rotation distorts location of cardiomediastinal anatomy limiting assessment of catheter position. There is a small left apical pneumothorax. Right lung is clear. Telemetry leads overlie the chest.  IMPRESSION: Small left apical pneumothorax status post Port-A-Cath placement.  These results will be called to the ordering clinician or representative by the Radiologist Assistant, and communication documented in the PACS or zVision Dashboard.   Electronically Signed   By: Misty Stanley M.D.   On: 06/15/2014 15:03   Dg Fluoro Guide Cv Line-no Report  06/15/2014   CLINICAL DATA:    FLOURO GUIDE CV LINE  Fluoroscopy was utilized by the requesting physician.  No radiographic  interpretation.    Ct Image Guided Fluid Drain By Catheter  06/19/2014   CLINICAL DATA:  Right pneumothorax  EXAM: CT IMAGE GUIDED FLUID DRAIN BY  CATHETER  FLUOROSCOPY TIME:  None  MEDICATIONS AND MEDICAL HISTORY: Versed 0 mg, Fentanyl 50 mcg.  Additional Medications: None.  ANESTHESIA/SEDATION: None  CONTRAST:  None  PROCEDURE: The procedure, risks, benefits, and alternatives were explained to the patient. Questions regarding the procedure were  encouraged and answered. The patient understands and consents to the procedure.  The right anterior upper thorax was prepped with Betadine in a sterile fashion, and a sterile drape was applied covering the operative field. A sterile gown and sterile gloves were used for the procedure.  Under CT guidance, a 19 gauge needle was inserted into the pleural space in the second rib interspace at the mid axillary line and removed over an Amplatz wire. A 12 French dilator followed by a 12 French pigtail drain were advanced over the Amplatz and coiled over the right lung apex. It was sewn to the skin with 0 silk. It was attached to a Pneumovac apparatus.  FINDINGS: Images document placement of a 93 French right apical thoracostomy.  COMPLICATIONS: None  IMPRESSION: Successful right chest tube placement for pneumothorax.   Electronically Signed   By: Marybelle Killings M.D.   On: 06/19/2014 14:17    ASSESSMENT: 74 y.o. [place] woman  (1) status post left modified radical mastectomy November 1998 for a pT2 pN0, stage IIA invasive ductal carcinoma, grade 2, estrogen and progesterone receptor positive, HER-2 not amplified  (a) adjuvant chemotherapy consisted of doxorubicin and cyclophosphamide 4  (b) adjuvant anti-estrogens consisted of tamoxifen, then letrozole, for a total of 5 years  (2) status post left breast lower outer quadrant biopsy 04/07/2014 for a clinical T1c N0,stage IA  invasive ductal carcinoma, with squamous features, estrogen and progesterone receptor negative, HER-2 not amplified, with an MIB-1 of 10%   (3) left lumpectomy and sentinel lymph node biopsy 05/05/2014 showed aT1c pN0, stage IA invasive ductal  carcinoma, grade 1, triple negative, with close but negative margins.   (4) Mammaprint classifies the tumor as basal like, high risk, and predicts a 30% chance of recurrence within 10 years with local treatment only.   (5) start of treatment delayed because of bilateral pneumothoraxes after port placement. adjuvant chemotherapy starting 07/11/2014 to consist of carboplatin and docetaxel given every 21 days 4 with Neulasta support.  (6) genetics testing March 2016 showed no deleterious mutation in the [employer] [ [employer] [employer]] including sequencing and rearrangement analysis for the following 24 genes:ATM, BARD1, BRCA1, BRCA2, BRIP1, CDH1, CHEK2, EPCAM, MLH1, MRE11A, MSH2, MSH6, MUTYH, NBN, NF1, PALB2, PMS2, PTEN, RAD50, RAD51C, RAD51D, SMARCA4, STK11, and TP53.   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    PLAN: Laura Li is understandably apprehensive about beginning chemotherapy. The intent of our visit was to discuss her antiemetic regimen, but we did not get that far. We spent about 25 minutes discussing her anxiety and depression. She would prefer not to begin treatment feeling the way she does. "Blah" is the best way she can describe it. She is walking daily and trying to gain some inspiration via endorphins. In the end, she does not feel like her venlafaxine is doing enough. She would like the dose increased. She is doing to try goubling her $RemoveBefo'75mg'aFCZXgwNrwt$  prescription. If she finds some benefit to this, we will officially prescribe the $RemoveBefor'150mg'paMPfihpdbyG$  tablets.   [place] will return in 1 week for a follow up visit. At this time we will discuss her antiemetic schedule. She understands and agrees with this plan. She knows the goal of treatment in her case is cure. She has been encouraged to call with any issues that might arise before her next visit here.   Laurie Panda, NP   06/29/2014 4:52 PM

## 2014-06-30 ENCOUNTER — Telehealth: Payer: Self-pay | Admitting: Nurse Practitioner

## 2014-06-30 ENCOUNTER — Telehealth: Payer: Self-pay

## 2014-06-30 DIAGNOSIS — F418 Other specified anxiety disorders: Secondary | ICD-10-CM

## 2014-06-30 MED ORDER — VENLAFAXINE HCL ER 75 MG PO CP24: 150.0000 mg | ORAL_CAPSULE | Freq: Every day | ORAL | Status: DC

## 2014-06-30 NOTE — Telephone Encounter (Signed)
Spoke with patient and she is aware of her appiontments

## 2014-06-30 NOTE — Telephone Encounter (Signed)
Pt called requesting effexor refill and clarification on dosage.

## 2014-06-30 NOTE — Telephone Encounter (Signed)
S/w pt and she will run out of effexor by next week's appt. The Rx will be filled for 3 months at the 75mg  capsule taking 2 caps/day. She uses [facility]. If she finds it helpful we can increase the capsule strength at the next refill.

## 2014-07-01 ENCOUNTER — Ambulatory Visit: Payer: Medicare Other | Admitting: Nurse Practitioner

## 2014-07-05 ENCOUNTER — Encounter: Payer: Self-pay | Admitting: Nurse Practitioner

## 2014-07-05 ENCOUNTER — Ambulatory Visit (HOSPITAL_BASED_OUTPATIENT_CLINIC_OR_DEPARTMENT_OTHER): Payer: Medicare Other | Admitting: Nurse Practitioner

## 2014-07-05 ENCOUNTER — Encounter: Payer: Self-pay | Admitting: *Deleted

## 2014-07-05 ENCOUNTER — Other Ambulatory Visit (HOSPITAL_BASED_OUTPATIENT_CLINIC_OR_DEPARTMENT_OTHER): Payer: Medicare Other

## 2014-07-05 VITALS — BP 127/66 | HR 83 | Temp 97.6°F | Resp 18 | Ht 66.0 in | Wt 143.8 lb

## 2014-07-05 DIAGNOSIS — C50512 Malignant neoplasm of lower-outer quadrant of left female breast: Secondary | ICD-10-CM | POA: Diagnosis not present

## 2014-07-05 DIAGNOSIS — C50919 Malignant neoplasm of unspecified site of unspecified female breast: Secondary | ICD-10-CM

## 2014-07-05 DIAGNOSIS — C50912 Malignant neoplasm of unspecified site of left female breast: Secondary | ICD-10-CM

## 2014-07-05 DIAGNOSIS — C50911 Malignant neoplasm of unspecified site of right female breast: Secondary | ICD-10-CM

## 2014-07-05 DIAGNOSIS — Z853 Personal history of malignant neoplasm of breast: Secondary | ICD-10-CM | POA: Diagnosis not present

## 2014-07-05 DIAGNOSIS — Z171 Estrogen receptor negative status [ER-]: Secondary | ICD-10-CM | POA: Diagnosis not present

## 2014-07-05 LAB — CBC WITH DIFFERENTIAL/PLATELET
BASO%: 0.3 % (ref 0.0–2.0)
Basophils Absolute: 0 10*3/uL (ref 0.0–0.1)
EOS%: 2.1 % (ref 0.0–7.0)
Eosinophils Absolute: 0.2 10*3/uL (ref 0.0–0.5)
HCT: 35.3 % (ref 34.8–46.6)
HEMOGLOBIN: 11.9 g/dL (ref 11.6–15.9)
LYMPH%: 24.3 % (ref 14.0–49.7)
MCH: 30.4 pg (ref 25.1–34.0)
MCHC: 33.7 g/dL (ref 31.5–36.0)
MCV: 90.1 fL (ref 79.5–101.0)
MONO#: 0.6 10*3/uL (ref 0.1–0.9)
MONO%: 8.4 % (ref 0.0–14.0)
NEUT#: 4.7 10*3/uL (ref 1.5–6.5)
NEUT%: 64.9 % (ref 38.4–76.8)
PLATELETS: 318 10*3/uL (ref 145–400)
RBC: 3.92 10*6/uL (ref 3.70–5.45)
RDW: 14.1 % (ref 11.2–14.5)
WBC: 7.2 10*3/uL (ref 3.9–10.3)
lymph#: 1.8 10*3/uL (ref 0.9–3.3)

## 2014-07-05 LAB — COMPREHENSIVE METABOLIC PANEL (CC13)
ALBUMIN: 4.2 g/dL (ref 3.5–5.0)
ALT: 10 U/L (ref 0–55)
ANION GAP: 13 meq/L — AB (ref 3–11)
AST: 15 U/L (ref 5–34)
Alkaline Phosphatase: 84 U/L (ref 40–150)
BILIRUBIN TOTAL: 0.4 mg/dL (ref 0.20–1.20)
BUN: 18.2 mg/dL (ref 7.0–26.0)
CHLORIDE: 100 meq/L (ref 98–109)
CO2: 24 mEq/L (ref 22–29)
CREATININE: 1.1 mg/dL (ref 0.6–1.1)
Calcium: 9.3 mg/dL (ref 8.4–10.4)
EGFR: 48 mL/min/{1.73_m2} — AB (ref >90)
Glucose: 114 mg/dl (ref 70–140)
Potassium: 4.5 mEq/L (ref 3.5–5.1)
Sodium: 136 mEq/L (ref 136–145)
Total Protein: 7.7 g/dL (ref 6.4–8.3)

## 2014-07-05 MED ORDER — LIDOCAINE-PRILOCAINE 2.5-2.5 % EX CREA: TOPICAL_CREAM | CUTANEOUS | Status: DC

## 2014-07-05 MED ORDER — PROCHLORPERAZINE MALEATE 10 MG PO TABS: 10.0000 mg | ORAL_TABLET | Freq: Four times a day (QID) | ORAL | Status: DC | PRN

## 2014-07-05 MED ORDER — ONDANSETRON HCL 8 MG PO TABS: 8.0000 mg | ORAL_TABLET | Freq: Two times a day (BID) | ORAL | Status: DC

## 2014-07-05 MED ORDER — DEXAMETHASONE 4 MG PO TABS: 8.0000 mg | ORAL_TABLET | Freq: Two times a day (BID) | ORAL | Status: DC

## 2014-07-05 NOTE — Progress Notes (Signed)
Called pharmacy and [facility] confirmed receipt of this order.

## 2014-07-05 NOTE — Progress Notes (Signed)
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 Non-of the 25 lymph nodes removed from the right axilla were involved. The tumor was estrogen and progesterone receptor positive, HER-2 not amplified. She was treated adjuvantly with doxorubicin and cyclophosphamide 4, then received 5 years of antiestrogen therapy (initially tamoxifen, later letrozole). There has been no evidence of disease recurrence on the right.  More recently, Laura Li had routine left screening mamm[facility]. This showed a suspicious area of architectural distortion in the inferior portion of the left breast. On 40/97/3532 [place] underwent left diagnostic mammography with ultrasonography. Spot compression views confirmed the presence 7 area of distortion in the lower central left breast, which was not palpable by physical exam (there was a prior biopsy scar in the upper outer quadrant of the left breast). Ultrasound confirmed a hypoechoic spiculated mass at the 6:00 location in the left breast, measuring maximally 1.3 cm. Sonography of the left axilla was negative.  Biopsy of the left breast mass in question 120 10/29/2014 showed ([id number]) an invasive breast cancer with squamous features. It was estrogen and progesterone receptor negative. There was no HER-2 amplification, the signals ratio being 0.82 and the number per cell 1.80. The  MIB-1 was 10%.  The patient has met with Dr. Donne Hazel in surgery and Dr. Pablo Ledger in radiation oncology. The patient understands there is no survival difference between lumpectomy and radiation compared with mastectomy. The patient will need sentinel lymph node sampling with either of those procedures. If she does have a lumpectomy, she will benefit from adjuvant radiation. The patient has been set up for genetics testing and this may affect her ultimate surgical decision.  Her subsequent history is as detailed below  INTERVAL HISTORY: Laura Li returns today for follow-up of her breast cancer, accompanied by her daughter Laura Li.  She is here to discuss her antiemetic regimen in order to start doxorubicin and carboplatin this upcoming Monday. Originally she felt to overwhelmed with her recent hospitalization to start with chemo, but she is feeling much better this week. At our last visit we doubled her venlafaxine to 111m daily and she is tolerating this well so far. She has not needed any ativan.   REVIEW OF SYSTEMS: Ryelle denies fevers, chills, nausea, or vomiting. She had 9 days of constipation while on narcotics for pain, but now is on tylenol alone and this has subsided. She is eating and drinking well. She denies shortness of breath, chest pain, cough, or palpitations. She has no bleeding. A detailed review of systems is otherwise stable.  PAST MEDICAL HISTORY: Past Medical History  Diagnosis Date  . OA (osteoarthritis)   . History of DVT (deep vein thrombosis)   . Hyperlipidemia   . Hypertension   . Hypothyroidism   . HX: breast cancer     melanoma  . Melanoma   . Eczema   . Arthritis   . Esophageal stricture   . GERD (gastroesophageal reflux disease)   .  PONV (postoperative nausea and vomiting)   . Wears glasses   . Depression     states no depression  . Chronic kidney disease     some renal impairment from meds  . Headache   . Anemia     from medicaions  . Heart murmur      states it is benign told 30-40 years ago  . Pneumothorax on right 06/15/2014    PAST SURGICAL HISTORY: Past Surgical History  Procedure Laterality Date  . Appendectomy  1967  . Melanoma excision  2004    right side of face  . 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 Total abdominal hysterectomy  1992    including ovaries  . Tonsillectomy and adenoidectomy  1949  . Lipoma excision  1978    right breast  . Mastectomy  1998    right-nodes out  . Breast lumpectomy      left breast-benign  . Breast surgery  1977    removal of calcified milk gland right breast  . Colonoscopy    . Radioactive seed guided mastectomy with axillary sentinel lymph node biopsy Left 05/05/2014    Procedure: RADIOACTIVE SEED GUIDED LEFT LUMPECTOMY WITH AXILLARY SENTINEL LYMPH NODE BIOPSY;  Surgeon: Rolm Bookbinder, MD;  Location: [facility];  Service: General;  Laterality: Left;  . Chest tube insertion  06/15/2014  . Portacath placement Left 06/15/2014    Procedure: INSERTION PORT-A-CATH;  Surgeon: Rolm Bookbinder, MD;  Location: [facility];  Service: General;  Laterality: Left;  . Chest tube insertion Bilateral 06/15/2014    Procedure: CHEST TUBE INSERTION;  Surgeon: Rolm Bookbinder, MD;  Location: [facility];  Service: General;  Laterality: Bilateral;    FAMILY HISTORY Family History  Problem Relation Age of Onset  . Heart disease Father 38    smoker  . Endometrial cancer Mother 3  . Breast cancer Cousin     x 3 cousins with breast cancer (one at 20, one at 86 and one at 80)  . Lung cancer Paternal Aunt     smoker  . Brain cancer Maternal Aunt   . Colon cancer Neg Hx   . Diabetes Maternal Aunt     ???  . Breast cancer Maternal Aunt 51  The patient's father died at the age of 45 from a myocardial infarction. The patient's mother died at the age of 65 from metastatic endometrial cancer which had been diagnosed 2 years before. The patient had no brothers, one sister. The patient's  mother's sister was diagnosed with breast cancer but the patient does not know at what age. A second maternal aunt was diagnosed with brain cancer. The patient has 3 cousins on her mother's side diagnosed with breast cancer, one of them under the age of 95. On the father's side there is a history of lung and cervical cancer  GYNECOLOGIC HISTORY:  No LMP recorded. Patient has had a hysterectomy. Menarche age 21, first live birth age 8. The patient is GX P1. She underwent hysterectomy with bilateral salpingo-oophorectomy in 1991. She used hormone replacement for approximately 7 years until 1998, when she had her right-sided breast cancer. She used oral contraceptives for approximately one year with no complications. 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 She is widowed and lives alone with no pets.  Her main hobby is painting. Her daughter Laura Li lives in [place] and is also a homemaker. The patient has 2 grandchildren. She attends wholly [place]: [employer]. The patient's daughter Laura Li is her healthcare power of attorney. Laura Li can be reached at [phone]   HEALTH MAINTENANCE: History  Substance Use Topics  . Smoking status: Former Smoker -- 0.75 packs/day for 3 years    Types: Cigarettes    Quit date: 03/11/1969  . Smokeless tobacco: Never Used  . Alcohol Use: 0.0 oz/week    0 Standard drinks or equivalent per week     Comment: occasional wine     Colonoscopy: 20/10/[facility]  UJW:JXBJYN post hysterectomy  Bone density:2014/osteopenia  Lipid panel:  Allergies  Allergen Reactions  . Dyazide [Hydrochlorothiazide W-Triamterene]     Presumption: drug induced vasculitis  . Niacin Other (See Comments)    flushing  . Penicillins Diarrhea and Nausea And Vomiting    Current Outpatient  Prescriptions  Medication Sig Dispense Refill  . acetaminophen (TYLENOL) 500 MG tablet Take 500 mg by mouth 2 (two) times daily as needed for mild pain.     [name] atorvastatin (LIPITOR) 20 MG tablet Take 1 tablet (20 mg total) by mouth daily. (Patient taking differently: Take 10 mg by mouth daily. ) 90 tablet 3  . B Complex-C-Folic Acid (STRESS FORMULA PO) Take 1 tablet by mouth daily.      . Calcium-Vitamin D (CALTRATE 600 PLUS-VIT D PO) Take 1 tablet by mouth 2 (two) times daily.     . Carboxymethylcellulose Sodium (REFRESH LIQUIGEL OP) Apply 1 drop to eye at bedtime as needed (dry eyes).    [name] esomeprazole (NEXIUM) 40 MG capsule TAKE 1 CAPSULE DAILY (Patient taking differently: Take 40 mg by mouth daily. TAKE 1 CAPSULE DAILY) 90 capsule 3  . fexofenadine (ALLEGRA) 180 MG tablet Take 180 mg by mouth daily as needed for allergies.     [name] ipratropium (ATROVENT) 0.03 % nasal spray Place 2 sprays into the nose every 12 (twelve) hours. 180 mL 4  . Lactobacillus-Inulin (CULTURELLE DIGESTIVE HEALTH) CHEW Chew 1 capsule by mouth daily.     [name] levothyroxine (SYNTHROID, LEVOTHROID) 75 MCG tablet Take 1 tablet (75 mcg total) by mouth daily. 90 tablet 2  . nadolol (CORGARD) 20 MG tablet Take 0.5 tablets (10 mg total) by mouth daily. 90 tablet 1  . Polyethyl Glycol-Propyl Glycol (SYSTANE) 0.4-0.3 % SOLN Apply 1 drop to eye daily as needed (for dryness/itching).     . venlafaxine XR (EFFEXOR-XR) 75 MG 24 hr capsule Take 2 capsules (150 mg total) by mouth daily. 180 capsule 0  . [facility] BISACODYL 10 MG suppository   0  . dexamethasone (DECADRON) 4 MG tablet Take 2 tablets (8 mg total) by mouth 2 (two) times daily. Start the day before Taxotere. Then again the day after chemo for 3 days. 30 tablet 1  . docusate sodium (COLACE) 100 MG capsule Take 1 capsule (100 mg total) by mouth 2 (two) times daily. (Patient not taking: Reported on 06/29/2014) 40 capsule 0  . lidocaine-prilocaine (EMLA) cream Apply to affected area once 30  g 3  . LORazepam (ATIVAN) 0.5 MG tablet 1/2 to 1 tab po q 8 hours prn (Patient not taking: Reported on 06/29/2014) 30 tablet 1  . ondansetron (ZOFRAN) 8 MG tablet   0  . ondansetron (ZOFRAN) 8 MG tablet Take 1 tablet (8 mg total) by mouth 2 (two) times daily. Start the day  after chemo for 3 days. Then take as needed for nausea or vomiting. 30 tablet 1  . prochlorperazine (COMPAZINE) 10 MG tablet Take 1 tablet (10 mg total) by mouth every 6 (six) hours as needed (Nausea or vomiting). 30 tablet 1  . traMADol (ULTRAM) 50 MG tablet Take 1 tablet (50 mg total) by mouth every 6 (six) hours as needed for moderate pain or severe pain. (Patient not taking: Reported on 06/29/2014) 20 tablet 0   No current facility-administered medications for this visit.    OBJECTIVE: Older white woman who appears well Filed Vitals:   07/05/14 1444  BP: 127/66  Pulse: 83  Temp: 97.6 F (36.4 C)  Resp: 18     Body mass index is 23.22 kg/(m^2).    ECOG FS:0 - Asymptomatic  Skin: warm, dry  HEENT: sclerae anicteric, conjunctivae pink, oropharynx clear. No thrush or mucositis.  Lymph Nodes: No cervical or supraclavicular lymphadenopathy  Lungs: clear to auscultation bilaterally, no rales, wheezes, or rhonci  Heart: regular rate and rhythm  Abdomen: round, soft, non tender, positive bowel sounds  Musculoskeletal: No focal spinal tenderness, no peripheral edema  Neuro: non focal, well oriented, depressed affect  Breasts: deferred  LAB RESULTS:  CMP     Component Value Date/Time   NA 136 07/05/2014 1412   NA 138 06/14/2014 1025   K 4.5 07/05/2014 1412   K 4.1 06/14/2014 1025   CL 100 06/14/2014 1025   CO2 24 07/05/2014 1412   CO2 33* 06/14/2014 1025   GLUCOSE 114 07/05/2014 1412   GLUCOSE 79 06/14/2014 1025   GLUCOSE 88 02/06/2006 1059   BUN 18.2 07/05/2014 1412   BUN 16 06/14/2014 1025   CREATININE 1.1 07/05/2014 1412   CREATININE 0.97 06/14/2014 1025   CALCIUM 9.3 07/05/2014 1412   CALCIUM 9.9  06/14/2014 1025   PROT 7.7 07/05/2014 1412   PROT 8.5* 12/21/2013 1004   ALBUMIN 4.2 07/05/2014 1412   ALBUMIN 4.2 12/21/2013 1004   AST 15 07/05/2014 1412   AST 24 12/21/2013 1004   ALT 10 07/05/2014 1412   ALT 19 12/21/2013 1004   ALKPHOS 84 07/05/2014 1412   ALKPHOS 64 12/21/2013 1004   BILITOT 0.40 07/05/2014 1412   BILITOT 0.8 12/21/2013 1004   GFRNONAA 56* 06/14/2014 1025   GFRAA 65* 06/14/2014 1025    INo results found for: SPEP, UPEP  Lab Results  Component Value Date   WBC 7.2 07/05/2014   NEUTROABS 4.7 07/05/2014   HGB 11.9 07/05/2014   HCT 35.3 07/05/2014   MCV 90.1 07/05/2014   PLT 318 07/05/2014      Chemistry      Component Value Date/Time   NA 136 07/05/2014 1412   NA 138 06/14/2014 1025   K 4.5 07/05/2014 1412   K 4.1 06/14/2014 1025   CL 100 06/14/2014 1025   CO2 24 07/05/2014 1412   CO2 33* 06/14/2014 1025   BUN 18.2 07/05/2014 1412   BUN 16 06/14/2014 1025   CREATININE 1.1 07/05/2014 1412   CREATININE 0.97 06/14/2014 1025      Component Value Date/Time   CALCIUM 9.3 07/05/2014 1412   CALCIUM 9.9 06/14/2014 1025   ALKPHOS 84 07/05/2014 1412   ALKPHOS 64 12/21/2013 1004   AST 15 07/05/2014 1412   AST 24 12/21/2013 1004   ALT 10 07/05/2014 1412   ALT 19 12/21/2013 1004   BILITOT 0.40 07/05/2014 1412   BILITOT 0.8 12/21/2013 1004       No  results found for: LABCA2  No components found for: LABCA125  No results for input(s): INR in the last 168 hours.  Urinalysis    Component Value Date/Time   COLORURINE YELLOW 05/17/2013 1330   APPEARANCEUR CLEAR 05/17/2013 1330   LABSPEC 1.025 05/17/2013 1330   PHURINE 5.5 05/17/2013 1330   GLUCOSEU NEGATIVE 05/17/2013 1330   HGBUR NEGATIVE 05/17/2013 1330   BILIRUBINUR n 06/21/2013 1115   BILIRUBINUR NEGATIVE 05/17/2013 1330   KETONESUR NEGATIVE 05/17/2013 1330   PROTEINUR n 06/21/2013 1115   PROTEINUR NEGATIVE 05/17/2013 1330   UROBILINOGEN 0.2 06/21/2013 1115   UROBILINOGEN 0.2  05/17/2013 1330   NITRITE n 06/21/2013 1115   NITRITE NEGATIVE 05/17/2013 1330   LEUKOCYTESUR Trace 06/21/2013 1115    STUDIES: Dg Chest 2 View  06/16/2014   ADDENDUM REPORT: 06/16/2014 14:32  ADDENDUM: Critical Value/emergent results were called by telephone at the time of interpretation on 06/16/2014 at 2:32 pm to [facility] , who verbally acknowledged these results.   Electronically Signed   By: Lowella Grip III M.D.   On: 06/16/2014 14:32   06/16/2014   CLINICAL DATA:  Shortness of breath. Prior documentation of pneumothorax.  EXAM: CHEST  2 VIEW  COMPARISON:  Study obtained earlier in the day  FINDINGS: The small left apical pneumothorax is stable. Chest tube remains in place on the left. Port-A-Cath tip is in the superior vena cava. There is a chest tube on the right which has withdrawn several cm. There is a right pneumothorax which is increased in size compared to earlier in the day.  There is mild left base atelectasis. Lungs are otherwise clear. Heart size and pulmonary vascularity are normal. No adenopathy.  IMPRESSION: Pneumothorax on the right has increased in size after right-sided chest tube has withdrawn several cm. No change in position of left chest tube with small left apical pneumothorax. Lungs are clear except for mild medial left base atelectasis. No change in cardiac silhouette.  Electronically Signed: By: Lowella Grip III M.D. On: 06/16/2014 14:20   Dg Chest Port 1 View  06/22/2014   CLINICAL DATA:  Encounter for chest tube removal  EXAM: PORTABLE CHEST - 1 VIEW  COMPARISON:  06/21/2014  FINDINGS: Stable positioning of left subclavian porta catheter, tip at the SVC level.  Stable heart size and aortic contours. No recurrence of pneumothorax. Lung volumes remain low, but basilar aeration is improved.  IMPRESSION: 1. No recurrence of pneumothorax. 2. Improving aeration.   Electronically Signed   By: Monte Fantasia M.D.   On: 06/22/2014 03:59   Dg Chest Port 1  View  06/21/2014   CLINICAL DATA:  Evaluate for pneumothorax. Recent RIGHT chest tube removal.  EXAM: PORTABLE CHEST - 1 VIEW  COMPARISON:  Chest radiograph earlier today.  FINDINGS: RIGHT chest tube has been removed. No pneumothorax. Stable LEFT subclavian Port-A-Cath and stable BILATERAL pleural effusions. Low lung volumes.  IMPRESSION: No pneumothorax is evident.   Electronically Signed   By: Rolla Flatten M.D.   On: 06/21/2014 14:21   Dg Chest Port 1 View  06/21/2014   CLINICAL DATA:  Right pneumothorax.  EXAM: PORTABLE CHEST - 1 VIEW  COMPARISON:  06/20/2014 and 06/19/2014  FINDINGS: Power port appears in good position. Right-sided chest tube is in good position. No visible residual pneumothorax on the right or left.  Heart size and pulmonary vascularity are normal. Tiny bilateral pleural effusions.  IMPRESSION: No pneumothorax.  Tiny bilateral pleural effusions.   Electronically Signed  By: Lorriane Shire M.D.   On: 06/21/2014 08:03   Dg Chest Port 1 View  06/20/2014   CLINICAL DATA:  Pneumothorax, right chest tube  EXAM: PORTABLE CHEST - 1 VIEW  COMPARISON:  Radiograph 06/19/2014  FINDINGS: Left power port unchanged. Interval placement of a small bore right chest tube at the apex. No appreciable pneumothorax. There is right basilar atelectasis similar prior.  IMPRESSION: Right chest tube in place without evidence of pneumothorax.   Electronically Signed   By: Suzy Bouchard M.D.   On: 06/20/2014 08:18   Dg Chest Port 1 View  06/19/2014   CLINICAL DATA:  Right-sided pneumothorax. Left-sided port placement on 06/15/2014.  EXAM: PORTABLE CHEST - 1 VIEW  COMPARISON:  06/18/2014.  FINDINGS: Right apical pneumothorax is again identified measuring 2.4 cm in maximum thickness. Previously 2.3 cm. Tiny residual left apical coal pneumothorax is barely visible. Chest wall port a catheter is no with tip in the projection of the SVC. Normal heart size. Small pleural effusions are identified.  IMPRESSION: 1.  Slight increase in volume of right apical pneumothorax. 2. Tiny left apical pneumothorax is barely visible.   Electronically Signed   By: Kerby Moors M.D.   On: 06/19/2014 10:32   Dg Chest Port 1 View  06/18/2014   CLINICAL DATA:  Follow-up pneumothorax, Port-A-Cath placement  EXAM: PORTABLE CHEST - 1 VIEW  COMPARISON:  06/17/2014  FINDINGS: Cardiomediastinal silhouette is stable. Left subclavian Port-A-Cath is unchanged in position. There is minimal increased small right apical pneumothorax measures 2 cm in thickness. On the prior exam measures 1.7 cm in thickness. No left pneumothorax. No acute infiltrate or pulmonary edema.  IMPRESSION: Left subclavian Port-A-Cath is unchanged in position. There is minimal increased small right apical pneumothorax measures 2 cm in thickness. On the prior exam measures 1.7 cm in thickness. No left pneumothorax. No acute infiltrate or pulmonary edema.   Electronically Signed   By: Lahoma Crocker M.D.   On: 06/18/2014 10:51   Dg Chest Port 1 View  06/17/2014   CLINICAL DATA:  Pneumothorax post chest tube removal  EXAM: PORTABLE CHEST - 1 VIEW  COMPARISON:  Portable exam 1730 hr compared to 0731 hr  FINDINGS: BILATERAL thoracostomy tubes no longer identified.  LEFT subclavian Port-A-Cath with tip projecting over SVC.  Normal heart size, mediastinal contours and pulmonary vascularity.  RIGHT apex pneumothorax not significantly changed from previous exam.  Tiny LEFT apex pneumothorax unchanged.  Bibasilar atelectasis.  No gross pulmonary infiltrate.  IMPRESSION: Persistent RIGHT and tiny LEFT apex pneumothoraces unchanged from previous exam.  Bibasilar atelectasis.   Electronically Signed   By: Lavonia Dana M.D.   On: 06/17/2014 17:55   Dg Chest Port 1 View  06/17/2014   CLINICAL DATA:  Bilateral chest tubes.  EXAM: PORTABLE CHEST - 1 VIEW  COMPARISON:  Radiograph 06/16/2014  FINDINGS: The a right chest tube has migrated into the subcutaneous tissue of the right chest wall. There  is no change in the volume of the right apical pneumothorax which measures 17 mm from the apical chest wall compared to 16 mm on prior.  Left chest tube remains unchanged in position. The small left apical pneumothorax measures 4 mm from the apical chest wall compared to 6 mm on prior.  Left power port unchanged in position. No pulmonary edema. Left pleural effusion unchanged.  IMPRESSION: 1. Stable biapical pneumothoraces. 2. Right chest tube has migrated out of the pleural space into the right subcutaneous tissue. 3. Left  chest tube unchanged. Findings conveyed toMATTHEW WAKEFIELD on 06/17/2014  at07:55.   Electronically Signed   By: Suzy Bouchard M.D.   On: 06/17/2014 07:56   Dg Chest Port 1 View  06/16/2014   CLINICAL DATA:  Reassess chest tubes for treatment of pneumothorax  EXAM: PORTABLE CHEST - 1 VIEW  COMPARISON:  Portable chest x-rays of June 15, 2014  FINDINGS: Bilateral chest tubes remain in place. The right lung is well-expanded and clear. On the left a tiny stable apical pneumothorax is demonstrated. There is no mediastinal shift. There is no significant pleural effusion. The cardiac silhouette is top-normal in size. The pulmonary vascularity is not engorged. The bony thorax is unremarkable. The heart size and mediastinal contours are within normal limits. Both lungs are clear. The visualized skeletal structures are unremarkable.  IMPRESSION: Residual tiny left apical pneumothorax amounting to less than 5% of the lung volume.   Electronically Signed   By: David  Martinique   On: 06/16/2014 07:58   Dg Chest Port 1 View  06/15/2014   CLINICAL DATA:  Bilateral chest tube placement for pneumothoraces.  EXAM: PORTABLE CHEST - 1 VIEW  COMPARISON:  06/15/2014 at 6:26 p.m.  FINDINGS: Left-sided power injectable Port-A-Cath observed, tip projecting over the SVC.  Bilateral chest tubes are present. 5% residual left apical pneumothorax. No visible residual right pneumothorax.  Mild enlargement of the  cardiopericardial silhouette.  No edema.  IMPRESSION: 1. Bilateral chest tubes of in place. Tiny residual left apical pneumothorax ; right pneumothorax is no longer present. 2. Mild enlargement of the cardiopericardial silhouette.   Electronically Signed   By: Van Clines M.D.   On: 06/15/2014 21:24   Dg Chest Port 1 View  06/15/2014   CLINICAL DATA:  Followup pneumothorax after port placement.  EXAM: PORTABLE CHEST - 1 VIEW  COMPARISON:  06/15/2014  FINDINGS: Increased small left apical pneumothorax, now 10-20%. There is a new, large right apical pneumothorax with mild diaphragm depression but no mediastinal shift.  Normal heart size and aortic contours.  Unremarkable positioning of left subclavian porta catheter.  Critical Value/emergent results were called by telephone at the time of interpretation on 06/15/2014 at 6:56 pm to Dr. Rolm Bookbinder , who verbally acknowledged these results.  IMPRESSION: 1. New large right pneumothorax with early diaphragm depression. 2. Enlarging left pneumothorax, now estimated 10 -20%.   Electronically Signed   By: Monte Fantasia M.D.   On: 06/15/2014 18:57   Dg Chest Port 1 View  06/15/2014   CLINICAL DATA:  Initial encounter for Port-A-Cath placement.  EXAM: PORTABLE CHEST - 1 VIEW  COMPARISON:  12/10/2012.  FINDINGS: 13 27 hrs. left Port-A-Cath is new in the interval with tip position overlying the mid SVC. Prominent leftward patient rotation distorts location of cardiomediastinal anatomy limiting assessment of catheter position. There is a small left apical pneumothorax. Right lung is clear. Telemetry leads overlie the chest.  IMPRESSION: Small left apical pneumothorax status post Port-A-Cath placement.  These results will be called to the ordering clinician or representative by the Radiologist Assistant, and communication documented in the PACS or zVision Dashboard.   Electronically Signed   By: Misty Stanley M.D.   On: 06/15/2014 15:03   Dg Fluoro Guide Cv  Line-no Report  06/15/2014   CLINICAL DATA:    FLOURO GUIDE CV LINE  Fluoroscopy was utilized by the requesting physician.  No radiographic  interpretation.    Ct Image Guided Fluid Drain By Catheter  06/19/2014   CLINICAL  DATA:  Right pneumothorax  EXAM: CT IMAGE GUIDED FLUID DRAIN BY CATHETER  FLUOROSCOPY TIME:  None  MEDICATIONS AND MEDICAL HISTORY: Versed 0 mg, Fentanyl 50 mcg.  Additional Medications: None.  ANESTHESIA/SEDATION: None  CONTRAST:  None  PROCEDURE: The procedure, risks, benefits, and alternatives were explained to the patient. Questions regarding the procedure were encouraged and answered. The patient understands and consents to the procedure.  The right anterior upper thorax was prepped with Betadine in a sterile fashion, and a sterile drape was applied covering the operative field. A sterile gown and sterile gloves were used for the procedure.  Under CT guidance, a 19 gauge needle was inserted into the pleural space in the second rib interspace at the mid axillary line and removed over an Amplatz wire. A 12 French dilator followed by a 12 French pigtail drain were advanced over the Amplatz and coiled over the right lung apex. It was sewn to the skin with 0 silk. It was attached to a Pneumovac apparatus.  FINDINGS: Images document placement of a 38 French right apical thoracostomy.  COMPLICATIONS: None  IMPRESSION: Successful right chest tube placement for pneumothorax.   Electronically Signed   By: Marybelle Killings M.D.   On: 06/19/2014 14:17    ASSESSMENT: 74 y.o. [place] woman  (1) status post left modified radical mastectomy November 1998 for a pT2 pN0, stage IIA invasive ductal carcinoma, grade 2, estrogen and progesterone receptor positive, HER-2 not amplified  (a) adjuvant chemotherapy consisted of doxorubicin and cyclophosphamide 4  (b) adjuvant anti-estrogens consisted of tamoxifen, then letrozole, for a total of 5 years  (2) status post left breast lower outer quadrant  biopsy 04/07/2014 for a clinical T1c N0,stage IA  invasive ductal carcinoma, with squamous features, estrogen and progesterone receptor negative, HER-2 not amplified, with an MIB-1 of 10%   (3) left lumpectomy and sentinel lymph node biopsy 05/05/2014 showed aT1c pN0, stage IA invasive ductal carcinoma, grade 1, triple negative, with close but negative margins.   (4) Mammaprint classifies the tumor as basal like, high risk, and predicts a 30% chance of recurrence within 10 years with local treatment only.   (5) start of treatment delayed because of bilateral pneumothoraxes after port placement. adjuvant chemotherapy starting 07/11/2014 to consist of carboplatin and docetaxel given every 21 days 4 with Neulasta support.  (6) genetics testing March 2016 showed no deleterious mutation in the [employer] [ [employer]] including sequencing and rearrangement analysis for the following 24 genes:ATM, BARD1, BRCA1, BRCA2, BRIP1, CDH1, CHEK2, EPCAM, MLH1, MRE11A, MSH2, MSH6, MUTYH, NBN, NF1, PALB2, PMS2, PTEN, RAD50, RAD51C, RAD51D, SMARCA4, STK11, and TP53.   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    PLAN: Anahis and I spent about 25 minutes discussing her upcoming chemotherapy plans. She was made aware of potential side effects and toxicities of both carboplatin and docetaxel. She attended chemotherapy school earlier in the month so a lot of this information was a review for her. She was made aware of her neulasta injections, including the fact that these will be administered with an onbody injection that she will need to wear until the device is no longer active.   Finally, we reviewed her antiemetic schedule, and she feels comfortable with every medication listed as well as the indication and potential side effects. These prescriptions were sent to her pharmacy today.   Laura Li will return on Monday for her first cycle of treatment. She  understands and  agrees with this plan. She knows the goal of treatment in her case is cure. She has been encouraged to call with any issues that might arise before her next visit here.  Laurie Panda, NP   07/05/2014 3:36 PM

## 2014-07-11 ENCOUNTER — Encounter: Payer: Self-pay | Admitting: *Deleted

## 2014-07-11 ENCOUNTER — Telehealth: Payer: Self-pay | Admitting: Nurse Practitioner

## 2014-07-11 ENCOUNTER — Telehealth: Payer: Self-pay | Admitting: Oncology

## 2014-07-11 ENCOUNTER — Other Ambulatory Visit: Payer: Medicare Other

## 2014-07-11 ENCOUNTER — Other Ambulatory Visit (HOSPITAL_BASED_OUTPATIENT_CLINIC_OR_DEPARTMENT_OTHER): Payer: Medicare Other

## 2014-07-11 ENCOUNTER — Ambulatory Visit (HOSPITAL_BASED_OUTPATIENT_CLINIC_OR_DEPARTMENT_OTHER): Payer: Medicare Other | Admitting: Oncology

## 2014-07-11 ENCOUNTER — Other Ambulatory Visit: Payer: Self-pay | Admitting: Oncology

## 2014-07-11 ENCOUNTER — Ambulatory Visit (HOSPITAL_BASED_OUTPATIENT_CLINIC_OR_DEPARTMENT_OTHER): Payer: Medicare Other

## 2014-07-11 VITALS — BP 149/66 | HR 71 | Temp 98.0°F | Resp 18

## 2014-07-11 VITALS — BP 143/61 | HR 88 | Temp 97.5°F | Resp 18 | Ht 66.0 in | Wt 142.7 lb

## 2014-07-11 DIAGNOSIS — D630 Anemia in neoplastic disease: Secondary | ICD-10-CM

## 2014-07-11 DIAGNOSIS — C50511 Malignant neoplasm of lower-outer quadrant of right female breast: Secondary | ICD-10-CM

## 2014-07-11 DIAGNOSIS — Z5111 Encounter for antineoplastic chemotherapy: Secondary | ICD-10-CM

## 2014-07-11 DIAGNOSIS — C50512 Malignant neoplasm of lower-outer quadrant of left female breast: Secondary | ICD-10-CM | POA: Diagnosis not present

## 2014-07-11 DIAGNOSIS — N183 Chronic kidney disease, stage 3 unspecified: Secondary | ICD-10-CM

## 2014-07-11 DIAGNOSIS — C50912 Malignant neoplasm of unspecified site of left female breast: Secondary | ICD-10-CM

## 2014-07-11 DIAGNOSIS — C50911 Malignant neoplasm of unspecified site of right female breast: Secondary | ICD-10-CM

## 2014-07-11 DIAGNOSIS — Z171 Estrogen receptor negative status [ER-]: Secondary | ICD-10-CM

## 2014-07-11 LAB — COMPREHENSIVE METABOLIC PANEL (CC13)
ALK PHOS: 81 U/L (ref 40–150)
ALT: 12 U/L (ref 0–55)
AST: 10 U/L (ref 5–34)
Albumin: 4.3 g/dL (ref 3.5–5.0)
Anion Gap: 14 mEq/L — ABNORMAL HIGH (ref 3–11)
BUN: 22.5 mg/dL (ref 7.0–26.0)
CALCIUM: 10.2 mg/dL (ref 8.4–10.4)
CO2: 22 meq/L (ref 22–29)
CREATININE: 1.1 mg/dL (ref 0.6–1.1)
Chloride: 100 mEq/L (ref 98–109)
EGFR: 49 mL/min/{1.73_m2} — ABNORMAL LOW (ref >90)
GLUCOSE: 206 mg/dL — AB (ref 70–140)
POTASSIUM: 4.1 meq/L (ref 3.5–5.1)
Sodium: 135 mEq/L — ABNORMAL LOW (ref 136–145)
TOTAL PROTEIN: 7.6 g/dL (ref 6.4–8.3)
Total Bilirubin: 0.44 mg/dL (ref 0.20–1.20)

## 2014-07-11 LAB — CBC WITH DIFFERENTIAL/PLATELET
BASO%: 0.1 % (ref 0.0–2.0)
Basophils Absolute: 0 10*3/uL (ref 0.0–0.1)
EOS%: 0 % (ref 0.0–7.0)
Eosinophils Absolute: 0 10*3/uL (ref 0.0–0.5)
HCT: 35.4 % (ref 34.8–46.6)
HGB: 11.8 g/dL (ref 11.6–15.9)
LYMPH%: 6.1 % — AB (ref 14.0–49.7)
MCH: 29.5 pg (ref 25.1–34.0)
MCHC: 33.2 g/dL (ref 31.5–36.0)
MCV: 88.9 fL (ref 79.5–101.0)
MONO#: 0.3 10*3/uL (ref 0.1–0.9)
MONO%: 2.3 % (ref 0.0–14.0)
NEUT#: 11.2 10*3/uL — ABNORMAL HIGH (ref 1.5–6.5)
NEUT%: 91.5 % — AB (ref 38.4–76.8)
PLATELETS: 271 10*3/uL (ref 145–400)
RBC: 3.98 10*6/uL (ref 3.70–5.45)
RDW: 14.3 % (ref 11.2–14.5)
WBC: 12.3 10*3/uL — ABNORMAL HIGH (ref 3.9–10.3)
lymph#: 0.8 10*3/uL — ABNORMAL LOW (ref 0.9–3.3)

## 2014-07-11 MED ORDER — SODIUM CHLORIDE 0.9 % IJ SOLN
10.0000 mL | INTRAMUSCULAR | Status: DC | PRN
  Administered 2014-07-11: 10 mL
  Filled 2014-07-11: qty 10

## 2014-07-11 MED ORDER — HEPARIN SOD (PORK) LOCK FLUSH 100 UNIT/ML IV SOLN
500.0000 [IU] | Freq: Once | INTRAVENOUS | Status: AC | PRN
  Administered 2014-07-11: 500 [IU]
  Filled 2014-07-11: qty 5

## 2014-07-11 MED ORDER — DEXTROSE 5 % IV SOLN
75.0000 mg/m2 | Freq: Once | INTRAVENOUS | Status: AC
  Administered 2014-07-11: 130 mg via INTRAVENOUS
  Filled 2014-07-11: qty 13

## 2014-07-11 MED ORDER — SODIUM CHLORIDE 0.9 % IV SOLN
356.5000 mg | Freq: Once | INTRAVENOUS | Status: AC
  Administered 2014-07-11: 360 mg via INTRAVENOUS
  Filled 2014-07-11: qty 36

## 2014-07-11 MED ORDER — SODIUM CHLORIDE 0.9 % IV SOLN
Freq: Once | INTRAVENOUS | Status: AC
  Administered 2014-07-11: 11:00:00 via INTRAVENOUS

## 2014-07-11 MED ORDER — SODIUM CHLORIDE 0.9 % IV SOLN
Freq: Once | INTRAVENOUS | Status: AC
  Administered 2014-07-11: 11:00:00 via INTRAVENOUS
  Filled 2014-07-11: qty 8

## 2014-07-11 MED ORDER — PEGFILGRASTIM 6 MG/0.6ML ~~LOC~~ PSKT
6.0000 mg | PREFILLED_SYRINGE | Freq: Once | SUBCUTANEOUS | Status: DC
  Filled 2014-07-11: qty 0.6

## 2014-07-11 NOTE — Patient Instructions (Signed)
[facility] Discharge Instructions for Patients Receiving Chemotherapy  Today you received the following chemotherapy agent  Taxotere and Carboplatin  To help prevent nausea and vomiting after your treatment, we encourage you to take your nausea medication as prescribed.   If you develop nausea and vomiting that is not controlled by your nausea medication, call the clinic.   BELOW ARE SYMPTOMS THAT SHOULD BE REPORTED IMMEDIATELY:  *FEVER GREATER THAN 100.5 F  *CHILLS WITH OR WITHOUT FEVER  NAUSEA AND VOMITING THAT IS NOT CONTROLLED WITH YOUR NAUSEA MEDICATION  *UNUSUAL SHORTNESS OF BREATH  *UNUSUAL BRUISING OR BLEEDING  TENDERNESS IN MOUTH AND THROAT WITH OR WITHOUT PRESENCE OF ULCERS  *URINARY PROBLEMS  *BOWEL PROBLEMS  UNUSUAL RASH Items with * indicate a potential emergency and should be followed up as soon as possible.  Feel free to call the clinic you have any questions or concerns. The clinic phone number is [phone].  Please show the [facility] at check-in to the Emergency Department and triage nurse.

## 2014-07-11 NOTE — Progress Notes (Signed)
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 Non-of the 25 lymph nodes removed from the right axilla were involved. The tumor was estrogen and progesterone receptor positive, HER-2 not amplified. She was treated adjuvantly with doxorubicin and cyclophosphamide 4, then received 5 years of antiestrogen therapy (initially tamoxifen, later letrozole). There has been no evidence of disease recurrence on the right.  More recently, [facility] March 08 2014. This showed a suspicious area of architectural distortion in the inferior portion of the left breast. On 97/67/3419 Laura Li underwent left diagnostic Li with ultrasonography. Spot compression views confirmed the presence 7 area of distortion in the lower central left breast, which was not palpable by physical exam (there was a prior biopsy scar in the upper outer quadrant of the left breast). Ultrasound confirmed a hypoechoic spiculated mass at the 6:00 location in the left breast, measuring maximally 1.3 cm. Sonography of the left axilla was negative.  Biopsy of the left breast mass in question 120 10/29/2014 showed ([id number]) an invasive breast cancer with squamous features. It was estrogen and progesterone receptor negative. There was no HER-2 amplification, the signals ratio being 0.82 and the number per cell 1.80. The  MIB-1 was 10%.  The patient has met with Dr. Donne Hazel in surgery and Dr. Pablo Ledger in radiation oncology. The patient understands there is no survival difference between lumpectomy and radiation compared with mastectomy. The patient will need sentinel lymph node sampling with either of those procedures. If she does have a lumpectomy, she will benefit from adjuvant radiation. The patient has been set up for genetics testing and this may affect her ultimate surgical decision.  Her subsequent history is as detailed below  INTERVAL HISTORY: Laura Li returns today for follow-up of her breast cancer. Today is day 1 cycle 1 of 4 planned cycles of cyclophosphamide and docetaxel, given every 21 days, with Neulasta on day 2 (the patient does not like the idea of onpro).  REVIEW OF SYSTEMS: Laura Li but not otherwise exercising on a regular basis. She is planning to have her hair "buzzed off" and already has a variety of habits and then as an other hair coverings prepared. She took her dexamethasone yesterday as directed, with no side effects that she is aware of. She took the Ativan last night and slipped "fine". Her port is still minimally sore.A detailed review of systems today was otherwise entirely negative.  PAST MEDICAL HISTORY: Past Medical History  Diagnosis Date  . OA (osteoarthritis)   . History of DVT (deep vein thrombosis)   . Hyperlipidemia   . Hypertension   . Hypothyroidism   . HX: breast cancer     melanoma  . Melanoma   . Eczema   . Arthritis   . Esophageal stricture   . GERD (gastroesophageal reflux disease)   . PONV (postoperative nausea and vomiting)   . Wears glasses   . Depression  states no depression  . Chronic kidney disease     some renal impairment from meds  . Headache   . Anemia     from medicaions  . Heart murmur     states it is benign told 30-40 years ago  . Pneumothorax on right 06/15/2014    PAST SURGICAL HISTORY: Past  Surgical History  Procedure Laterality Date  . Appendectomy  1967  . Melanoma excision  2004    right side of face  . 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 Total abdominal hysterectomy  1992    including ovaries  . Tonsillectomy and adenoidectomy  1949  . Lipoma excision  1978    right breast  . Mastectomy  1998    right-nodes out  . Breast lumpectomy      left breast-benign  . Breast surgery  1977    removal of calcified milk gland right breast  . Colonoscopy    . Radioactive seed guided mastectomy with axillary sentinel lymph node biopsy Left 05/05/2014    Procedure: RADIOACTIVE SEED GUIDED LEFT LUMPECTOMY WITH AXILLARY SENTINEL LYMPH NODE BIOPSY;  Surgeon: Rolm Bookbinder, MD;  Location: [facility];  Service: General;  Laterality: Left;  . Chest tube insertion  06/15/2014  . Portacath placement Left 06/15/2014    Procedure: INSERTION PORT-A-CATH;  Surgeon: Rolm Bookbinder, MD;  Location: [facility];  Service: General;  Laterality: Left;  . Chest tube insertion Bilateral 06/15/2014    Procedure: CHEST TUBE INSERTION;  Surgeon: Rolm Bookbinder, MD;  Location: [facility];  Service: General;  Laterality: Bilateral;    FAMILY HISTORY Family History  Problem Relation Age of Onset  . Heart disease Father 46    smoker  . Endometrial cancer Mother 52  . Breast cancer Cousin     x 3 cousins with breast cancer (one at 25, one at 76 and one at 61)  . Lung cancer Paternal Aunt     smoker  . Brain cancer Maternal Aunt   . Colon cancer Neg Hx   . Diabetes Maternal Aunt     ???  . Breast cancer Maternal Aunt 51  The patient's father died at the age of 29 from a myocardial infarction. The patient's mother died at the age of 45 from metastatic endometrial cancer which had been diagnosed 2 years before. The patient had no brothers, one sister. The patient's mother's sister was diagnosed with breast cancer but the patient does not know at what age. A second maternal aunt was  diagnosed with brain cancer. The patient has 3 cousins on her mother's side diagnosed with breast cancer, one of them under the age of 54. On the father's side there is a history of lung and cervical cancer  GYNECOLOGIC HISTORY:  No LMP recorded. Patient has had a hysterectomy. Menarche age 27, first live birth age 65. The patient is GX P1. She underwent hysterectomy with bilateral salpingo-oophorectomy in 1991. She used hormone replacement for approximately 7 years until 1998, when she had her right-sided breast cancer. She used oral contraceptives for approximately one year with no complications. 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 She is widowed and lives alone with no pets. Her main hobby is painting. Her daughter Faith Rogue lives in [place] and is also a homemaker. The  patient has 2 grandchildren. She attends wholly [employer]. The patient's daughter [name] is her healthcare power of attorney. [name] can be reached at [phone]   HEALTH MAINTENANCE: History  Substance Use Topics  . Smoking status: Former Smoker -- 0.75 packs/day for 3 years    Types: Cigarettes    Quit date: 03/11/1969  . Smokeless tobacco: Never Used  . Alcohol Use: 0.0 oz/week    0 Standard drinks or equivalent per week     Comment: occasional wine     Colonoscopy: 20/10/[facility]  OPF:[id number] post hysterectomy  Bone density:2014/osteopenia  Lipid panel:  Allergies  Allergen Reactions  . Dyazide [Hydrochlorothiazide W-Triamterene]     Presumption: drug induced vasculitis  . Niacin Other (See Comments)    flushing  . Penicillins Diarrhea and Nausea And Vomiting    Current Outpatient Prescriptions  Medication Sig Dispense Refill  . acetaminophen (TYLENOL) 500 MG tablet Take 500 mg by mouth 2 (two) times  daily as needed for mild pain.     [name] atorvastatin (LIPITOR) 20 MG tablet Take 1 tablet (20 mg total) by mouth daily. (Patient taking differently: Take 10 mg by mouth daily. ) 90 tablet 3  . B Complex-C-Folic Acid (STRESS FORMULA PO) Take 1 tablet by mouth daily.      . Calcium-Vitamin D (CALTRATE 600 PLUS-VIT D PO) Take 1 tablet by mouth 2 (two) times daily.     . Carboxymethylcellulose Sodium (REFRESH LIQUIGEL OP) Apply 1 drop to eye at bedtime as needed (dry eyes).    [name] dexamethasone (DECADRON) 4 MG tablet Take 2 tablets (8 mg total) by mouth 2 (two) times daily. Start the day before Taxotere. Then again the day after chemo for 3 days. 30 tablet 1  . docusate sodium (COLACE) 100 MG capsule Take 1 capsule (100 mg total) by mouth 2 (two) times daily. (Patient not taking: Reported on 06/29/2014) 40 capsule 0  . esomeprazole (NEXIUM) 40 MG capsule TAKE 1 CAPSULE DAILY (Patient taking differently: Take 40 mg by mouth daily. TAKE 1 CAPSULE DAILY) 90 capsule 3  . fexofenadine (ALLEGRA) 180 MG tablet Take 180 mg by mouth daily as needed for allergies.     [name] ipratropium (ATROVENT) 0.03 % nasal spray Place 2 sprays into the nose every 12 (twelve) hours. 180 mL 4  . Lactobacillus-Inulin (CULTURELLE DIGESTIVE HEALTH) CHEW Chew 1 capsule by mouth daily.     [name] levothyroxine (SYNTHROID, LEVOTHROID) 75 MCG tablet Take 1 tablet (75 mcg total) by mouth daily. 90 tablet 2  . lidocaine-prilocaine (EMLA) cream Apply to affected area once 30 g 3  . LORazepam (ATIVAN) 0.5 MG tablet 1/2 to 1 tab po q 8 hours prn (Patient not taking: Reported on 06/29/2014) 30 tablet 1  . nadolol (CORGARD) 20 MG tablet Take 0.5 tablets (10 mg total) by mouth daily. 90 tablet 1  . ondansetron (ZOFRAN) 8 MG tablet Take 1 tablet (8 mg total) by mouth 2 (two) times daily. Start the day after chemo for 3 days. Then take as needed for nausea or vomiting. 30 tablet 1  . Polyethyl Glycol-Propyl Glycol (SYSTANE) 0.4-0.3 % SOLN Apply 1 drop to eye  daily as needed (for dryness/itching).     . prochlorperazine (COMPAZINE) 10 MG tablet Take 1 tablet (10 mg total) by mouth every 6 (six) hours as needed (Nausea or vomiting). 30 tablet 1  . traMADol (ULTRAM) 50 MG tablet Take 1 tablet (50 mg total) by mouth every 6 (  six) hours as needed for moderate pain or severe pain. (Patient not taking: Reported on 06/29/2014) 20 tablet 0  . venlafaxine XR (EFFEXOR-XR) 75 MG 24 hr capsule Take 2 capsules (150 mg total) by mouth daily. 180 capsule 0   No current facility-administered medications for this visit.    OBJECTIVE: Older white womanin no acute distress  Filed Vitals:   07/11/14 0854  BP: 143/61  Pulse: 88  Temp: 97.5 F (36.4 C)  Resp: 18     Body mass index is 23.04 kg/(m^2).    ECOG FS:0 - Asymptomatic  Sclerae unicteric, pupils round and equal Oropharynx clear, slightly dry No cervical or supraclavicular adenopathy Lungs no rales or rhonchi Heart regular rate and rhythm Abd soft, nontender, positive bowel sounds MSK no focal spinal tenderness, no upper extremity lymphedema Neuro: nonfocal, well oriented, appropriate affect Breasts: deferred   LAB RESULTS:  CMP     Component Value Date/Time   NA 136 07/05/2014 1412   NA 138 06/14/2014 1025   K 4.5 07/05/2014 1412   K 4.1 06/14/2014 1025   CL 100 06/14/2014 1025   CO2 24 07/05/2014 1412   CO2 33* 06/14/2014 1025   GLUCOSE 114 07/05/2014 1412   GLUCOSE 79 06/14/2014 1025   GLUCOSE 88 02/06/2006 1059   BUN 18.2 07/05/2014 1412   BUN 16 06/14/2014 1025   CREATININE 1.1 07/05/2014 1412   CREATININE 0.97 06/14/2014 1025   CALCIUM 9.3 07/05/2014 1412   CALCIUM 9.9 06/14/2014 1025   PROT 7.7 07/05/2014 1412   PROT 8.5* 12/21/2013 1004   ALBUMIN 4.2 07/05/2014 1412   ALBUMIN 4.2 12/21/2013 1004   AST 15 07/05/2014 1412   AST 24 12/21/2013 1004   ALT 10 07/05/2014 1412   ALT 19 12/21/2013 1004   ALKPHOS 84 07/05/2014 1412   ALKPHOS 64 12/21/2013 1004   BILITOT 0.40  07/05/2014 1412   BILITOT 0.8 12/21/2013 1004   GFRNONAA 56* 06/14/2014 1025   GFRAA 65* 06/14/2014 1025    INo results found for: SPEP, UPEP  Lab Results  Component Value Date   WBC 12.3* 07/11/2014   NEUTROABS 11.2* 07/11/2014   HGB 11.8 07/11/2014   HCT 35.4 07/11/2014   MCV 88.9 07/11/2014   PLT 271 07/11/2014      Chemistry      Component Value Date/Time   NA 136 07/05/2014 1412   NA 138 06/14/2014 1025   K 4.5 07/05/2014 1412   K 4.1 06/14/2014 1025   CL 100 06/14/2014 1025   CO2 24 07/05/2014 1412   CO2 33* 06/14/2014 1025   BUN 18.2 07/05/2014 1412   BUN 16 06/14/2014 1025   CREATININE 1.1 07/05/2014 1412   CREATININE 0.97 06/14/2014 1025      Component Value Date/Time   CALCIUM 9.3 07/05/2014 1412   CALCIUM 9.9 06/14/2014 1025   ALKPHOS 84 07/05/2014 1412   ALKPHOS 64 12/21/2013 1004   AST 15 07/05/2014 1412   AST 24 12/21/2013 1004   ALT 10 07/05/2014 1412   ALT 19 12/21/2013 1004   BILITOT 0.40 07/05/2014 1412   BILITOT 0.8 12/21/2013 1004       No results found for: LABCA2  No components found for: [id number]  No results for input(s): INR in the last 168 hours.  Urinalysis    Component Value Date/Time   COLORURINE YELLOW 05/17/2013 1330   APPEARANCEUR CLEAR 05/17/2013 1330   LABSPEC 1.025 05/17/2013 1330   PHURINE 5.5 05/17/2013 1330   GLUCOSEU NEGATIVE  05/17/2013 1330   HGBUR NEGATIVE 05/17/2013 1330   BILIRUBINUR n 06/21/2013 1115   BILIRUBINUR NEGATIVE 05/17/2013 1330   KETONESUR NEGATIVE 05/17/2013 1330   PROTEINUR n 06/21/2013 1115   PROTEINUR NEGATIVE 05/17/2013 1330   UROBILINOGEN 0.2 06/21/2013 1115   UROBILINOGEN 0.2 05/17/2013 1330   NITRITE n 06/21/2013 1115   NITRITE NEGATIVE 05/17/2013 1330   LEUKOCYTESUR Trace 06/21/2013 1115    STUDIES: Dg Chest 2 View  06/16/2014   ADDENDUM REPORT: 06/16/2014 14:32  ADDENDUM: Critical Value/emergent results were called by telephone at the time of interpretation on 06/16/2014 at 2:32  pm to [facility] , who verbally acknowledged these results.   Electronically Signed   By: Lowella Grip III M.D.   On: 06/16/2014 14:32   06/16/2014   CLINICAL DATA:  Shortness of breath. Prior documentation of pneumothorax.  EXAM: CHEST  2 VIEW  COMPARISON:  Study obtained earlier in the day  FINDINGS: The small left apical pneumothorax is stable. Chest tube remains in place on the left. Port-A-Cath tip is in the superior vena cava. There is a chest tube on the right which has withdrawn several cm. There is a right pneumothorax which is increased in size compared to earlier in the day.  There is mild left base atelectasis. Lungs are otherwise clear. Heart size and pulmonary vascularity are normal. No adenopathy.  IMPRESSION: Pneumothorax on the right has increased in size after right-sided chest tube has withdrawn several cm. No change in position of left chest tube with small left apical pneumothorax. Lungs are clear except for mild medial left base atelectasis. No change in cardiac silhouette.  Electronically Signed: By: Lowella Grip III M.D. On: 06/16/2014 14:20   Dg Chest Port 1 View  06/22/2014   CLINICAL DATA:  Encounter for chest tube removal  EXAM: PORTABLE CHEST - 1 VIEW  COMPARISON:  06/21/2014  FINDINGS: Stable positioning of left subclavian porta catheter, tip at the SVC level.  Stable heart size and aortic contours. No recurrence of pneumothorax. Lung volumes remain low, but basilar aeration is improved.  IMPRESSION: 1. No recurrence of pneumothorax. 2. Improving aeration.   Electronically Signed   By: Monte Fantasia M.D.   On: 06/22/2014 03:59   Dg Chest Port 1 View  06/21/2014   CLINICAL DATA:  Evaluate for pneumothorax. Recent RIGHT chest tube removal.  EXAM: PORTABLE CHEST - 1 VIEW  COMPARISON:  Chest radiograph earlier today.  FINDINGS: RIGHT chest tube has been removed. No pneumothorax. Stable LEFT subclavian Port-A-Cath and stable BILATERAL pleural effusions. Low lung  volumes.  IMPRESSION: No pneumothorax is evident.   Electronically Signed   By: Rolla Flatten M.D.   On: 06/21/2014 14:21   Dg Chest Port 1 View  06/21/2014   CLINICAL DATA:  Right pneumothorax.  EXAM: PORTABLE CHEST - 1 VIEW  COMPARISON:  06/20/2014 and 06/19/2014  FINDINGS: Power port appears in good position. Right-sided chest tube is in good position. No visible residual pneumothorax on the right or left.  Heart size and pulmonary vascularity are normal. Tiny bilateral pleural effusions.  IMPRESSION: No pneumothorax.  Tiny bilateral pleural effusions.   Electronically Signed   By: Lorriane Shire M.D.   On: 06/21/2014 08:03   Dg Chest Port 1 View  06/20/2014   CLINICAL DATA:  Pneumothorax, right chest tube  EXAM: PORTABLE CHEST - 1 VIEW  COMPARISON:  Radiograph 06/19/2014  FINDINGS: Left power port unchanged. Interval placement of a small bore right chest tube at  the apex. No appreciable pneumothorax. There is right basilar atelectasis similar prior.  IMPRESSION: Right chest tube in place without evidence of pneumothorax.   Electronically Signed   By: Genevive Bi M.D.   On: 06/20/2014 08:18   Dg Chest Port 1 View  06/19/2014   CLINICAL DATA:  Right-sided pneumothorax. Left-sided port placement on 06/15/2014.  EXAM: PORTABLE CHEST - 1 VIEW  COMPARISON:  06/18/2014.  FINDINGS: Right apical pneumothorax is again identified measuring 2.4 cm in maximum thickness. Previously 2.3 cm. Tiny residual left apical coal pneumothorax is barely visible. Chest wall port a catheter is no with tip in the projection of the SVC. Normal heart size. Small pleural effusions are identified.  IMPRESSION: 1. Slight increase in volume of right apical pneumothorax. 2. Tiny left apical pneumothorax is barely visible.   Electronically Signed   By: Signa Kell M.D.   On: 06/19/2014 10:32   Dg Chest Port 1 View  06/18/2014   CLINICAL DATA:  Follow-up pneumothorax, Port-A-Cath placement  EXAM: PORTABLE CHEST - 1 VIEW   COMPARISON:  06/17/2014  FINDINGS: Cardiomediastinal silhouette is stable. Left subclavian Port-A-Cath is unchanged in position. There is minimal increased small right apical pneumothorax measures 2 cm in thickness. On the prior exam measures 1.7 cm in thickness. No left pneumothorax. No acute infiltrate or pulmonary edema.  IMPRESSION: Left subclavian Port-A-Cath is unchanged in position. There is minimal increased small right apical pneumothorax measures 2 cm in thickness. On the prior exam measures 1.7 cm in thickness. No left pneumothorax. No acute infiltrate or pulmonary edema.   Electronically Signed   By: Natasha Mead M.D.   On: 06/18/2014 10:51   Dg Chest Port 1 View  06/17/2014   CLINICAL DATA:  Pneumothorax post chest tube removal  EXAM: PORTABLE CHEST - 1 VIEW  COMPARISON:  Portable exam 1730 hr compared to 0731 hr  FINDINGS: BILATERAL thoracostomy tubes no longer identified.  LEFT subclavian Port-A-Cath with tip projecting over SVC.  Normal heart size, mediastinal contours and pulmonary vascularity.  RIGHT apex pneumothorax not significantly changed from previous exam.  Tiny LEFT apex pneumothorax unchanged.  Bibasilar atelectasis.  No gross pulmonary infiltrate.  IMPRESSION: Persistent RIGHT and tiny LEFT apex pneumothoraces unchanged from previous exam.  Bibasilar atelectasis.   Electronically Signed   By: Ulyses Southward M.D.   On: 06/17/2014 17:55   Dg Chest Port 1 View  06/17/2014   CLINICAL DATA:  Bilateral chest tubes.  EXAM: PORTABLE CHEST - 1 VIEW  COMPARISON:  Radiograph 06/16/2014  FINDINGS: The a right chest tube has migrated into the subcutaneous tissue of the right chest wall. There is no change in the volume of the right apical pneumothorax which measures 17 mm from the apical chest wall compared to 16 mm on prior.  Left chest tube remains unchanged in position. The small left apical pneumothorax measures 4 mm from the apical chest wall compared to 6 mm on prior.  Left power port unchanged  in position. No pulmonary edema. Left pleural effusion unchanged.  IMPRESSION: 1. Stable biapical pneumothoraces. 2. Right chest tube has migrated out of the pleural space into the right subcutaneous tissue. 3. Left chest tube unchanged. Findings conveyed toMATTHEW WAKEFIELD on 06/17/2014  at07:55.   Electronically Signed   By: Genevive Bi M.D.   On: 06/17/2014 07:56   Dg Chest Port 1 View  06/16/2014   CLINICAL DATA:  Reassess chest tubes for treatment of pneumothorax  EXAM: PORTABLE CHEST - 1 VIEW  COMPARISON:  Portable chest x-rays of June 15, 2014  FINDINGS: Bilateral chest tubes remain in place. The right lung is well-expanded and clear. On the left a tiny stable apical pneumothorax is demonstrated. There is no mediastinal shift. There is no significant pleural effusion. The cardiac silhouette is top-normal in size. The pulmonary vascularity is not engorged. The bony thorax is unremarkable. The heart size and mediastinal contours are within normal limits. Both lungs are clear. The visualized skeletal structures are unremarkable.  IMPRESSION: Residual tiny left apical pneumothorax amounting to less than 5% of the lung volume.   Electronically Signed   By: David  Martinique   On: 06/16/2014 07:58   Dg Chest Port 1 View  06/15/2014   CLINICAL DATA:  Bilateral chest tube placement for pneumothoraces.  EXAM: PORTABLE CHEST - 1 VIEW  COMPARISON:  06/15/2014 at 6:26 p.m.  FINDINGS: Left-sided power injectable Port-A-Cath observed, tip projecting over the SVC.  Bilateral chest tubes are present. 5% residual left apical pneumothorax. No visible residual right pneumothorax.  Mild enlargement of the cardiopericardial silhouette.  No edema.  IMPRESSION: 1. Bilateral chest tubes of in place. Tiny residual left apical pneumothorax ; right pneumothorax is no longer present. 2. Mild enlargement of the cardiopericardial silhouette.   Electronically Signed   By: Van Clines M.D.   On: 06/15/2014 21:24   Dg Chest Port  1 View  06/15/2014   CLINICAL DATA:  Followup pneumothorax after port placement.  EXAM: PORTABLE CHEST - 1 VIEW  COMPARISON:  06/15/2014  FINDINGS: Increased small left apical pneumothorax, now 10-20%. There is a new, large right apical pneumothorax with mild diaphragm depression but no mediastinal shift.  Normal heart size and aortic contours.  Unremarkable positioning of left subclavian porta catheter.  Critical Value/emergent results were called by telephone at the time of interpretation on 06/15/2014 at 6:56 pm to Dr. Rolm Bookbinder , who verbally acknowledged these results.  IMPRESSION: 1. New large right pneumothorax with early diaphragm depression. 2. Enlarging left pneumothorax, now estimated 10 -20%.   Electronically Signed   By: Monte Fantasia M.D.   On: 06/15/2014 18:57   Dg Chest Port 1 View  06/15/2014   CLINICAL DATA:  Initial encounter for Port-A-Cath placement.  EXAM: PORTABLE CHEST - 1 VIEW  COMPARISON:  12/10/2012.  FINDINGS: 13 27 hrs. left Port-A-Cath is new in the interval with tip position overlying the mid SVC. Prominent leftward patient rotation distorts location of cardiomediastinal anatomy limiting assessment of catheter position. There is a small left apical pneumothorax. Right lung is clear. Telemetry leads overlie the chest.  IMPRESSION: Small left apical pneumothorax status post Port-A-Cath placement.  These results will be called to the ordering clinician or representative by the Radiologist Assistant, and communication documented in the PACS or zVision Dashboard.   Electronically Signed   By: Misty Stanley M.D.   On: 06/15/2014 15:03   Dg Fluoro Guide Cv Line-no Report  06/15/2014   CLINICAL DATA:    FLOURO GUIDE CV LINE  Fluoroscopy was utilized by the requesting physician.  No radiographic  interpretation.    Ct Image Guided Fluid Drain By Catheter  06/19/2014   CLINICAL DATA:  Right pneumothorax  EXAM: CT IMAGE GUIDED FLUID DRAIN BY CATHETER  FLUOROSCOPY TIME:  None   MEDICATIONS AND MEDICAL HISTORY: Versed 0 mg, Fentanyl 50 mcg.  Additional Medications: None.  ANESTHESIA/SEDATION: None  CONTRAST:  None  PROCEDURE: The procedure, risks, benefits, and alternatives were explained to the patient. Questions regarding the  procedure were encouraged and answered. The patient understands and consents to the procedure.  The right anterior upper thorax was prepped with Betadine in a sterile fashion, and a sterile drape was applied covering the operative field. A sterile gown and sterile gloves were used for the procedure.  Under CT guidance, a 19 gauge needle was inserted into the pleural space in the second rib interspace at the mid axillary line and removed over an Amplatz wire. A 12 French dilator followed by a 12 French pigtail drain were advanced over the Amplatz and coiled over the right lung apex. It was sewn to the skin with 0 silk. It was attached to a Pneumovac apparatus.  FINDINGS: Images document placement of a 29 French right apical thoracostomy.  COMPLICATIONS: None  IMPRESSION: Successful right chest tube placement for pneumothorax.   Electronically Signed   By: Marybelle Killings M.D.   On: 06/19/2014 14:17    ASSESSMENT: 74 y.o. Laura Li  (1) status post left modified radical mastectomy November 1998 for a pT2 pN0, stage IIA invasive ductal carcinoma, grade 2, estrogen and progesterone receptor positive, HER-2 not amplified  (a) adjuvant chemotherapy consisted of doxorubicin and cyclophosphamide 4  (b) adjuvant anti-estrogens consisted of tamoxifen, then letrozole, for a total of 5 years  (2) status post left breast lower outer quadrant biopsy 04/07/2014 for a clinical T1c N0,stage IA  invasive ductal carcinoma, with squamous features, estrogen and progesterone receptor negative, HER-2 not amplified, with an MIB-1 of 10%   (3) left lumpectomy and sentinel lymph node biopsy 05/05/2014 showed aT1c pN0, stage IA invasive ductal carcinoma, grade 1, triple  negative, with close but negative margins.   (4) Mammaprint classifies the tumor as basal like, high risk, and predicts a 30% chance of recurrence within 10 years with local treatment only.   (5) start of treatment delayed because of bilateral pneumothoraxes after port placement. adjuvant chemotherapy starting 07/11/2014 to consist of carboplatin and docetaxel given every 21 days 4 with Neulasta support.  (6) genetics testing March 2016 showed no deleterious mutation in the [employer] panel  [[employer]] including sequencing and rearrangement analysis for the following 24 genes:ATM, BARD1, BRCA1, BRCA2, BRIP1, CDH1, CHEK2, EPCAM, MLH1, MRE11A, MSH2, MSH6, MUTYH, NBN, NF1, PALB2, PMS2, PTEN, RAD50, RAD51C, RAD51D, SMARCA4, STK11, and TP53.   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    PLAN: Laura Li is finally ready to start her chemotherapy treatments. She took the dexamethasone yesterday as prescribed and she has a good understanding of how to use the "roadmap" of supportive medications. She has an appointment in a week with our advanced practice provider to review her symptoms from treatmentand I have made her an extra appointment with meMay 20 to reinforce any changes that we will make so her last 3 treatments are if possible better tolerated than the first.  I have encouraged her to continue to walk and do other exercise on a regular basis. Also it will be helpful for her to hydrate herself a bit better.  Laura Li has a good understanding of the overall plan. She agrees with it. She knows to call for any problems that may develop before her next visit here.  Chauncey Cruel, MD   07/11/2014 9:07 AM

## 2014-07-11 NOTE — Telephone Encounter (Signed)
Chemo appointments made per staff message °

## 2014-07-11 NOTE — Telephone Encounter (Signed)
Gave avs & calendar for May. Sent message to schedule treatment. °

## 2014-07-11 NOTE — Progress Notes (Signed)
Met with pt during 1st chemotherapy treatment. Denies needs at this time. Encourage pt to call with questions or concerns. Received verbal understanding.

## 2014-07-12 ENCOUNTER — Ambulatory Visit (HOSPITAL_BASED_OUTPATIENT_CLINIC_OR_DEPARTMENT_OTHER): Payer: Medicare Other

## 2014-07-12 VITALS — BP 124/52 | HR 72 | Temp 97.7°F | Resp 16

## 2014-07-12 DIAGNOSIS — C50912 Malignant neoplasm of unspecified site of left female breast: Secondary | ICD-10-CM

## 2014-07-12 DIAGNOSIS — C50512 Malignant neoplasm of lower-outer quadrant of left female breast: Secondary | ICD-10-CM

## 2014-07-12 DIAGNOSIS — Z5189 Encounter for other specified aftercare: Secondary | ICD-10-CM

## 2014-07-12 DIAGNOSIS — C50911 Malignant neoplasm of unspecified site of right female breast: Secondary | ICD-10-CM

## 2014-07-12 MED ORDER — PEGFILGRASTIM INJECTION 6 MG/0.6ML ~~LOC~~
6.0000 mg | PREFILLED_SYRINGE | Freq: Once | SUBCUTANEOUS | Status: AC
  Administered 2014-07-12: 6 mg via SUBCUTANEOUS
  Filled 2014-07-12: qty 0.6

## 2014-07-12 NOTE — Patient Instructions (Signed)
Pegfilgrastim injection What is this medicine? PEGFILGRASTIM (peg fil GRA stim) is a long-acting granulocyte colony-stimulating factor that stimulates the growth of neutrophils, a type of white blood cell important in the body's fight against infection. It is used to reduce the incidence of fever and infection in patients with certain types of cancer who are receiving chemotherapy that affects the bone marrow. This medicine may be used for other purposes; ask your health care provider or pharmacist if you have questions. COMMON BRAND NAME(S): Neulasta What should I tell my health care provider before I take this medicine? They need to know if you have any of these conditions: -latex allergy -ongoing radiation therapy -sickle cell disease -skin reactions to acrylic adhesives (On-Body Injector only) -an unusual or allergic reaction to pegfilgrastim, filgrastim, other medicines, foods, dyes, or preservatives -pregnant or trying to get pregnant -breast-feeding How should I use this medicine? This medicine is for injection under the skin. If you get this medicine at home, you will be taught how to prepare and give the pre-filled syringe or how to use the On-body Injector. Refer to the patient Instructions for Use for detailed instructions. Use exactly as directed. Take your medicine at regular intervals. Do not take your medicine more often than directed. It is important that you put your used needles and syringes in a special sharps container. Do not put them in a trash can. If you do not have a sharps container, call your pharmacist or healthcare provider to get one. Talk to your pediatrician regarding the use of this medicine in children. Special care may be needed. Overdosage: If you think you have taken too much of this medicine contact a poison control center or emergency room at once. NOTE: This medicine is only for you. Do not share this medicine with others. What if I miss a dose? It is  important not to miss your dose. Call your doctor or health care professional if you miss your dose. If you miss a dose due to an On-body Injector failure or leakage, a new dose should be administered as soon as possible using a single prefilled syringe for manual use. What may interact with this medicine? Interactions have not been studied. Give your health care provider a list of all the medicines, herbs, non-prescription drugs, or dietary supplements you use. Also tell them if you smoke, drink alcohol, or use illegal drugs. Some items may interact with your medicine. This list may not describe all possible interactions. Give your health care provider a list of all the medicines, herbs, non-prescription drugs, or dietary supplements you use. Also tell them if you smoke, drink alcohol, or use illegal drugs. Some items may interact with your medicine. What should I watch for while using this medicine? You may need blood work done while you are taking this medicine. If you are going to need a MRI, CT scan, or other procedure, tell your doctor that you are using this medicine (On-Body Injector only). What side effects may I notice from receiving this medicine? Side effects that you should report to your doctor or health care professional as soon as possible: -allergic reactions like skin rash, itching or hives, swelling of the face, lips, or tongue -dizziness -fever -pain, redness, or irritation at site where injected -pinpoint red spots on the skin -shortness of breath or breathing problems -stomach or side pain, or pain at the shoulder -swelling -tiredness -trouble passing urine Side effects that usually do not require medical attention (report to your doctor   or health care professional if they continue or are bothersome): -bone pain -muscle pain This list may not describe all possible side effects. Call your doctor for medical advice about side effects. You may report side effects to FDA at  1-800-FDA-1088. Where should I keep my medicine? Keep out of the reach of children. Store pre-filled syringes in a refrigerator between 2 and 8 degrees C (36 and 46 degrees F). Do not freeze. Keep in carton to protect from light. Throw away this medicine if it is left out of the refrigerator for more than 48 hours. Throw away any unused medicine after the expiration date. NOTE: This sheet is a summary. It may not cover all possible information. If you have questions about this medicine, talk to your doctor, pharmacist, or health care provider.  2015, [employer]. (2013-05-27 16:14:05)  

## 2014-07-18 ENCOUNTER — Telehealth: Payer: Self-pay | Admitting: Nurse Practitioner

## 2014-07-18 ENCOUNTER — Encounter: Payer: Self-pay | Admitting: *Deleted

## 2014-07-18 ENCOUNTER — Encounter: Payer: Self-pay | Admitting: Nurse Practitioner

## 2014-07-18 ENCOUNTER — Ambulatory Visit (HOSPITAL_BASED_OUTPATIENT_CLINIC_OR_DEPARTMENT_OTHER): Payer: Medicare Other | Admitting: Nurse Practitioner

## 2014-07-18 ENCOUNTER — Other Ambulatory Visit (HOSPITAL_BASED_OUTPATIENT_CLINIC_OR_DEPARTMENT_OTHER): Payer: Medicare Other

## 2014-07-18 VITALS — BP 127/61 | HR 97 | Temp 97.3°F | Resp 18 | Wt 139.9 lb

## 2014-07-18 DIAGNOSIS — Z853 Personal history of malignant neoplasm of breast: Secondary | ICD-10-CM | POA: Diagnosis not present

## 2014-07-18 DIAGNOSIS — Z171 Estrogen receptor negative status [ER-]: Secondary | ICD-10-CM

## 2014-07-18 DIAGNOSIS — C50912 Malignant neoplasm of unspecified site of left female breast: Secondary | ICD-10-CM

## 2014-07-18 DIAGNOSIS — C50812 Malignant neoplasm of overlapping sites of left female breast: Secondary | ICD-10-CM

## 2014-07-18 LAB — CBC WITH DIFFERENTIAL/PLATELET
BASO%: 0.6 % (ref 0.0–2.0)
BASOS ABS: 0.1 10*3/uL (ref 0.0–0.1)
EOS%: 1.4 % (ref 0.0–7.0)
Eosinophils Absolute: 0.2 10*3/uL (ref 0.0–0.5)
HCT: 38.6 % (ref 34.8–46.6)
HGB: 13.1 g/dL (ref 11.6–15.9)
LYMPH%: 17.1 % (ref 14.0–49.7)
MCH: 30.3 pg (ref 25.1–34.0)
MCHC: 33.9 g/dL (ref 31.5–36.0)
MCV: 89.1 fL (ref 79.5–101.0)
MONO#: 1.7 10*3/uL — ABNORMAL HIGH (ref 0.1–0.9)
MONO%: 13.7 % (ref 0.0–14.0)
NEUT%: 67.2 % (ref 38.4–76.8)
NEUTROS ABS: 8.4 10*3/uL — AB (ref 1.5–6.5)
Platelets: 98 10*3/uL — ABNORMAL LOW (ref 145–400)
RBC: 4.33 10*6/uL (ref 3.70–5.45)
RDW: 13.8 % (ref 11.2–14.5)
WBC: 12.5 10*3/uL — AB (ref 3.9–10.3)
lymph#: 2.1 10*3/uL (ref 0.9–3.3)

## 2014-07-18 LAB — COMPREHENSIVE METABOLIC PANEL (CC13)
ALK PHOS: 93 U/L (ref 40–150)
ALT: 25 U/L (ref 0–55)
AST: 19 U/L (ref 5–34)
Albumin: 4.1 g/dL (ref 3.5–5.0)
Anion Gap: 12 mEq/L — ABNORMAL HIGH (ref 3–11)
BILIRUBIN TOTAL: 0.67 mg/dL (ref 0.20–1.20)
BUN: 24.1 mg/dL (ref 7.0–26.0)
CALCIUM: 9.8 mg/dL (ref 8.4–10.4)
CHLORIDE: 97 meq/L — AB (ref 98–109)
CO2: 25 mEq/L (ref 22–29)
CREATININE: 1.1 mg/dL (ref 0.6–1.1)
EGFR: 48 mL/min/{1.73_m2} — ABNORMAL LOW (ref >90)
Glucose: 135 mg/dl (ref 70–140)
Potassium: 4.2 mEq/L (ref 3.5–5.1)
Sodium: 135 mEq/L — ABNORMAL LOW (ref 136–145)
Total Protein: 7.3 g/dL (ref 6.4–8.3)

## 2014-07-18 NOTE — Progress Notes (Signed)
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 Non-of the 25 lymph nodes removed from the right axilla were involved. The tumor was estrogen and progesterone receptor positive, HER-2 not amplified. She was treated adjuvantly with doxorubicin and cyclophosphamide 4, then received 5 years of antiestrogen therapy (initially tamoxifen, later letrozole). There has been no evidence of disease recurrence on the right.  More recently, Celenia had routine left screening mamm[facility]. This showed a suspicious area of architectural distortion in the inferior portion of the left breast. On 93/81/0175 Kaiyana underwent left diagnostic mammography with ultrasonography. Spot compression views confirmed the presence 7 area of distortion in the lower central left breast, which was not palpable by physical exam (there was a prior biopsy scar in the upper outer quadrant of the left breast). Ultrasound confirmed a hypoechoic spiculated mass at the 6:00 location in the left breast, measuring maximally 1.3 cm. Sonography of the left axilla was negative.  Biopsy of the left breast mass in question 120 10/29/2014 showed ([id number]) an invasive breast cancer with squamous features. It was estrogen and progesterone receptor negative. There was no HER-2 amplification, the signals ratio being 0.82 and the number per cell 1.80. The  MIB-1 was 10%.  The patient has met with Dr. Donne Hazel in surgery and Dr. Pablo Ledger in radiation oncology. The patient understands there is no survival difference between lumpectomy and radiation compared with mastectomy. The patient will need sentinel lymph node sampling with either of those procedures. If she does have a lumpectomy, she will benefit from adjuvant radiation. The patient has been set up for genetics testing and this may affect her ultimate surgical decision.  Her subsequent history is as detailed below  INTERVAL HISTORY: Porschia returns today for follow-up of her breast cancer, accompanied by her daughter, [name]. Today is day 8 cycle 1 of 4 planned cycles of cyclophosphamide and docetaxel, given every 21 days, with Neulasta on day 2.  REVIEW OF SYSTEMS: Claudene denies fevers or chills. Her nausea was minimal. She was constipated for a few days, but the combination of colace and miralax proved effective. Her appetite is increased, despite taste changes but she is down a few pounds today. She is fatigued and complains of chemo brain already. She is not sleeping well this week, but this was not a problem during the week of chemo. She is having a sensation of ear fullness, and a scratchy throat. She denies other sinus symptoms, and is already on nexium for reflux. She continues to have lower back and hip pain from the neulasta injection. A detailed review of system is otherwise stable.   PAST MEDICAL HISTORY: Past Medical History  Diagnosis Date  . OA (osteoarthritis)   . History of DVT (deep vein thrombosis)   . Hyperlipidemia   . Hypertension   . Hypothyroidism   . HX: breast cancer     melanoma  . Melanoma   . Eczema   . Arthritis   .  Esophageal stricture   . GERD (gastroesophageal reflux disease)   . PONV (postoperative nausea and vomiting)   . Wears glasses   . Depression     states no depression  . Chronic kidney disease     some renal impairment from meds  . Headache     . Anemia     from medicaions  . Heart murmur     states it is benign told 30-40 years ago  . Pneumothorax on right 06/15/2014    PAST SURGICAL HISTORY: Past Surgical History  Procedure Laterality Date  . Appendectomy  1967  . Melanoma excision  2004    right side of face  . 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 Total abdominal hysterectomy  1992    including ovaries  . Tonsillectomy and adenoidectomy  1949  . Lipoma excision  1978    right breast  . Mastectomy  1998    right-nodes out  . Breast lumpectomy      left breast-benign  . Breast surgery  1977    removal of calcified milk gland right breast  . Colonoscopy    . Radioactive seed guided mastectomy with axillary sentinel lymph node biopsy Left 05/05/2014    Procedure: RADIOACTIVE SEED GUIDED LEFT LUMPECTOMY WITH AXILLARY SENTINEL LYMPH NODE BIOPSY;  Surgeon: Rolm Bookbinder, MD;  Location: [facility];  Service: General;  Laterality: Left;  . Chest tube insertion  06/15/2014  . Portacath placement Left 06/15/2014    Procedure: INSERTION PORT-A-CATH;  Surgeon: Rolm Bookbinder, MD;  Location: [facility];  Service: General;  Laterality: Left;  . Chest tube insertion Bilateral 06/15/2014    Procedure: CHEST TUBE INSERTION;  Surgeon: Rolm Bookbinder, MD;  Location: [facility];  Service: General;  Laterality: Bilateral;    FAMILY HISTORY Family History  Problem Relation Age of Onset  . Heart disease Father 70    smoker  . Endometrial cancer Mother 13  . Breast cancer Cousin     x 3 cousins with breast cancer (one at 52, one at 52 and one at 37)  . Lung cancer Paternal Aunt     smoker  . Brain cancer Maternal Aunt   . Colon cancer Neg Hx   . Diabetes Maternal Aunt     ???  . Breast cancer Maternal Aunt 51  The patient's father died at the age of 86 from a myocardial infarction. The patient's mother died at the age of 93 from metastatic endometrial cancer which had been diagnosed 2 years before. The  patient had no brothers, one sister. The patient's mother's sister was diagnosed with breast cancer but the patient does not know at what age. A second maternal aunt was diagnosed with brain cancer. The patient has 3 cousins on her mother's side diagnosed with breast cancer, one of them under the age of 33. On the father's side there is a history of lung and cervical cancer  GYNECOLOGIC HISTORY:  No LMP recorded. Patient has had a hysterectomy. Menarche age 59, first live birth age 6. The patient is GX P1. She underwent hysterectomy with bilateral salpingo-oophorectomy in 1991. She used hormone replacement for approximately 7 years until 1998, when she had her right-sided breast cancer. She used oral contraceptives for approximately one year with no complications. 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 She is widowed and lives alone with no pets. Her main hobby is painting. Her daughter Faith Rogue lives in [place] and is also a homemaker. The patient has 2 grandchildren. She attends wholly [employer] [employer]. The patient's daughter [name] is her healthcare power of attorney. [name] can be reached at [phone]   HEALTH MAINTENANCE: History  Substance Use Topics  . Smoking status: Former Smoker -- 0.75 packs/day for 3 years    Types: Cigarettes    Quit date: 03/11/1969  . Smokeless tobacco: Never Used  . Alcohol Use: 0.0 oz/week    0 Standard drinks or equivalent per week     Comment: occasional wine     Colonoscopy: 20/10/[facility]  OIN:OMVEHM post hysterectomy  Bone density:2014/osteopenia  Lipid panel:  Allergies  Allergen Reactions  . Dyazide [Hydrochlorothiazide W-Triamterene]     Presumption: drug induced vasculitis  . Niacin Other (See Comments)    flushing  . Penicillins Diarrhea and  Nausea And Vomiting    Current Outpatient Prescriptions  Medication Sig Dispense Refill  . acetaminophen (TYLENOL) 500 MG tablet Take 500 mg by mouth 2 (two) times daily as needed for mild pain.     [name] atorvastatin (LIPITOR) 20 MG tablet Take 1 tablet (20 mg total) by mouth daily. (Patient taking differently: Take 10 mg by mouth daily. ) 90 tablet 3  . B Complex-C-Folic Acid (STRESS FORMULA PO) Take 1 tablet by mouth daily.      . Calcium-Vitamin D (CALTRATE 600 PLUS-VIT D PO) Take 1 tablet by mouth 2 (two) times daily.     [name] esomeprazole (NEXIUM) 40 MG capsule TAKE 1 CAPSULE DAILY (Patient taking differently: Take 40 mg by mouth daily. TAKE 1 CAPSULE DAILY) 90 capsule 3  . Lactobacillus-Inulin (CULTURELLE DIGESTIVE HEALTH) CHEW Chew 1 capsule by mouth daily.     [name] levothyroxine (SYNTHROID, LEVOTHROID) 75 MCG tablet Take 1 tablet (75 mcg total) by mouth daily. 90 tablet 2  . LORazepam (ATIVAN) 0.5 MG tablet 1/2 to 1 tab po q 8 hours prn 30 tablet 1  . nadolol (CORGARD) 20 MG tablet Take 0.5 tablets (10 mg total) by mouth daily. 90 tablet 1  . ondansetron (ZOFRAN) 8 MG tablet Take 1 tablet (8 mg total) by mouth 2 (two) times daily. Start the day after chemo for 3 days. Then take as needed for nausea or vomiting. 30 tablet 1  . Polyethyl Glycol-Propyl Glycol (SYSTANE) 0.4-0.3 % SOLN Apply 1 drop to eye daily as needed (for dryness/itching).     . prochlorperazine (COMPAZINE) 10 MG tablet Take 1 tablet (10 mg total) by mouth every 6 (six) hours as needed (Nausea or vomiting). 30 tablet 1  . traMADol (ULTRAM) 50 MG tablet Take 1 tablet (50 mg total) by mouth every 6 (six) hours as needed for moderate pain or severe pain. 20 tablet 0  . venlafaxine XR (EFFEXOR-XR) 75 MG 24 hr capsule Take 2 capsules (150 mg total) by mouth daily. 180 capsule 0  . Carboxymethylcellulose Sodium (REFRESH LIQUIGEL OP) Apply 1 drop to eye at bedtime as needed (dry eyes).    [name] dexamethasone (DECADRON) 4 MG tablet Take 2  tablets (8 mg total) by mouth 2 (two) times daily. Start the day before Taxotere. Then again the day after chemo for 3 days. (Patient not taking: Reported on 07/18/2014) 30 tablet 1  . docusate sodium (COLACE) 100 MG capsule Take 1 capsule (100 mg total) by mouth 2 (  two) times daily. (Patient not taking: Reported on 06/29/2014) 40 capsule 0  . fexofenadine (ALLEGRA) 180 MG tablet Take 180 mg by mouth daily as needed for allergies.     [name] ipratropium (ATROVENT) 0.03 % nasal spray Place 2 sprays into the nose every 12 (twelve) hours. (Patient not taking: Reported on 07/18/2014) 180 mL 4  . lidocaine-prilocaine (EMLA) cream Apply to affected area once (Patient not taking: Reported on 07/18/2014) 30 g 3   No current facility-administered medications for this visit.    OBJECTIVE: Older white womanin no acute distress  Filed Vitals:   07/18/14 0936  BP: 127/61  Pulse: 97  Temp: 97.3 F (36.3 C)  Resp: 18     Body mass index is 22.59 kg/(m^2).    ECOG FS:0 - Asymptomatic  Skin: warm, dry  HEENT: sclerae anicteric, conjunctivae pink, oropharynx clear. No thrush or mucositis. Left TM normal. Right TM bulging but erythema or other signs of infection. Lymph Nodes: No cervical or supraclavicular lymphadenopathy  Lungs: clear to auscultation bilaterally, no rales, wheezes, or rhonci  Heart: regular rate and rhythm  Abdomen: round, soft, non tender, positive bowel sounds  Musculoskeletal: No focal spinal tenderness, no peripheral edema  Neuro: non focal, well oriented, positive affect  Breasts: deferred  LAB RESULTS:  CMP     Component Value Date/Time   NA 135* 07/18/2014 0922   NA 138 06/14/2014 1025   K 4.2 07/18/2014 0922   K 4.1 06/14/2014 1025   CL 100 06/14/2014 1025   CO2 25 07/18/2014 0922   CO2 33* 06/14/2014 1025   GLUCOSE 135 07/18/2014 0922   GLUCOSE 79 06/14/2014 1025   GLUCOSE 88 02/06/2006 1059   BUN 24.1 07/18/2014 0922   BUN 16 06/14/2014 1025   CREATININE 1.1 07/18/2014 0922    CREATININE 0.97 06/14/2014 1025   CALCIUM 9.8 07/18/2014 0922   CALCIUM 9.9 06/14/2014 1025   PROT 7.3 07/18/2014 0922   PROT 8.5* 12/21/2013 1004   ALBUMIN 4.1 07/18/2014 0922   ALBUMIN 4.2 12/21/2013 1004   AST 19 07/18/2014 0922   AST 24 12/21/2013 1004   ALT 25 07/18/2014 0922   ALT 19 12/21/2013 1004   ALKPHOS 93 07/18/2014 0922   ALKPHOS 64 12/21/2013 1004   BILITOT 0.67 07/18/2014 0922   BILITOT 0.8 12/21/2013 1004   GFRNONAA 56* 06/14/2014 1025   GFRAA 65* 06/14/2014 1025    INo results found for: SPEP, UPEP  Lab Results  Component Value Date   WBC 12.5* 07/18/2014   NEUTROABS 8.4* 07/18/2014   HGB 13.1 07/18/2014   HCT 38.6 07/18/2014   MCV 89.1 07/18/2014   PLT 98* 07/18/2014      Chemistry      Component Value Date/Time   NA 135* 07/18/2014 0922   NA 138 06/14/2014 1025   K 4.2 07/18/2014 0922   K 4.1 06/14/2014 1025   CL 100 06/14/2014 1025   CO2 25 07/18/2014 0922   CO2 33* 06/14/2014 1025   BUN 24.1 07/18/2014 0922   BUN 16 06/14/2014 1025   CREATININE 1.1 07/18/2014 0922   CREATININE 0.97 06/14/2014 1025      Component Value Date/Time   CALCIUM 9.8 07/18/2014 0922   CALCIUM 9.9 06/14/2014 1025   ALKPHOS 93 07/18/2014 0922   ALKPHOS 64 12/21/2013 1004   AST 19 07/18/2014 0922   AST 24 12/21/2013 1004   ALT 25 07/18/2014 0922   ALT 19 12/21/2013 1004   BILITOT 0.67 07/18/2014 0034  BILITOT 0.8 12/21/2013 1004       No results found for: LABCA2  No components found for: LHTDS287  No results for input(s): INR in the last 168 hours.  Urinalysis    Component Value Date/Time   COLORURINE YELLOW 05/17/2013 1330   APPEARANCEUR CLEAR 05/17/2013 1330   LABSPEC 1.025 05/17/2013 1330   PHURINE 5.5 05/17/2013 1330   GLUCOSEU NEGATIVE 05/17/2013 1330   HGBUR NEGATIVE 05/17/2013 1330   BILIRUBINUR n 06/21/2013 1115   BILIRUBINUR NEGATIVE 05/17/2013 1330   KETONESUR NEGATIVE 05/17/2013 1330   PROTEINUR n 06/21/2013 1115   PROTEINUR  NEGATIVE 05/17/2013 1330   UROBILINOGEN 0.2 06/21/2013 1115   UROBILINOGEN 0.2 05/17/2013 1330   NITRITE n 06/21/2013 1115   NITRITE NEGATIVE 05/17/2013 1330   LEUKOCYTESUR Trace 06/21/2013 1115    STUDIES: Dg Chest Port 1 View  06/22/2014   CLINICAL DATA:  Encounter for chest tube removal  EXAM: PORTABLE CHEST - 1 VIEW  COMPARISON:  06/21/2014  FINDINGS: Stable positioning of left subclavian porta catheter, tip at the SVC level.  Stable heart size and aortic contours. No recurrence of pneumothorax. Lung volumes remain low, but basilar aeration is improved.  IMPRESSION: 1. No recurrence of pneumothorax. 2. Improving aeration.   Electronically Signed   By: Monte Fantasia M.D.   On: 06/22/2014 03:59   Dg Chest Port 1 View  06/21/2014   CLINICAL DATA:  Evaluate for pneumothorax. Recent RIGHT chest tube removal.  EXAM: PORTABLE CHEST - 1 VIEW  COMPARISON:  Chest radiograph earlier today.  FINDINGS: RIGHT chest tube has been removed. No pneumothorax. Stable LEFT subclavian Port-A-Cath and stable BILATERAL pleural effusions. Low lung volumes.  IMPRESSION: No pneumothorax is evident.   Electronically Signed   By: Rolla Flatten M.D.   On: 06/21/2014 14:21   Dg Chest Port 1 View  06/21/2014   CLINICAL DATA:  Right pneumothorax.  EXAM: PORTABLE CHEST - 1 VIEW  COMPARISON:  06/20/2014 and 06/19/2014  FINDINGS: Power port appears in good position. Right-sided chest tube is in good position. No visible residual pneumothorax on the right or left.  Heart size and pulmonary vascularity are normal. Tiny bilateral pleural effusions.  IMPRESSION: No pneumothorax.  Tiny bilateral pleural effusions.   Electronically Signed   By: Lorriane Shire M.D.   On: 06/21/2014 08:03   Dg Chest Port 1 View  06/20/2014   CLINICAL DATA:  Pneumothorax, right chest tube  EXAM: PORTABLE CHEST - 1 VIEW  COMPARISON:  Radiograph 06/19/2014  FINDINGS: Left power port unchanged. Interval placement of a small bore right chest tube at the  apex. No appreciable pneumothorax. There is right basilar atelectasis similar prior.  IMPRESSION: Right chest tube in place without evidence of pneumothorax.   Electronically Signed   By: Suzy Bouchard M.D.   On: 06/20/2014 08:18   Dg Chest Port 1 View  06/19/2014   CLINICAL DATA:  Right-sided pneumothorax. Left-sided port placement on 06/15/2014.  EXAM: PORTABLE CHEST - 1 VIEW  COMPARISON:  06/18/2014.  FINDINGS: Right apical pneumothorax is again identified measuring 2.4 cm in maximum thickness. Previously 2.3 cm. Tiny residual left apical coal pneumothorax is barely visible. Chest wall port a catheter is no with tip in the projection of the SVC. Normal heart size. Small pleural effusions are identified.  IMPRESSION: 1. Slight increase in volume of right apical pneumothorax. 2. Tiny left apical pneumothorax is barely visible.   Electronically Signed   By: Kerby Moors M.D.   On: 06/19/2014  10:32   Ct Image Guided Fluid Drain By Catheter  06/19/2014   CLINICAL DATA:  Right pneumothorax  EXAM: CT IMAGE GUIDED FLUID DRAIN BY CATHETER  FLUOROSCOPY TIME:  None  MEDICATIONS AND MEDICAL HISTORY: Versed 0 mg, Fentanyl 50 mcg.  Additional Medications: None.  ANESTHESIA/SEDATION: None  CONTRAST:  None  PROCEDURE: The procedure, risks, benefits, and alternatives were explained to the patient. Questions regarding the procedure were encouraged and answered. The patient understands and consents to the procedure.  The right anterior upper thorax was prepped with Betadine in a sterile fashion, and a sterile drape was applied covering the operative field. A sterile gown and sterile gloves were used for the procedure.  Under CT guidance, a 19 gauge needle was inserted into the pleural space in the second rib interspace at the mid axillary line and removed over an Amplatz wire. A 12 French dilator followed by a 12 French pigtail drain were advanced over the Amplatz and coiled over the right lung apex. It was sewn to the  skin with 0 silk. It was attached to a Pneumovac apparatus.  FINDINGS: Images document placement of a 79 French right apical thoracostomy.  COMPLICATIONS: None  IMPRESSION: Successful right chest tube placement for pneumothorax.   Electronically Signed   By: Marybelle Killings M.D.   On: 06/19/2014 14:17    ASSESSMENT: 74 y.o. [place] woman  (1) status post left modified radical mastectomy November 1998 for a pT2 pN0, stage IIA invasive ductal carcinoma, grade 2, estrogen and progesterone receptor positive, HER-2 not amplified  (a) adjuvant chemotherapy consisted of doxorubicin and cyclophosphamide 4  (b) adjuvant anti-estrogens consisted of tamoxifen, then letrozole, for a total of 5 years  (2) status post left breast lower outer quadrant biopsy 04/07/2014 for a clinical T1c N0,stage IA  invasive ductal carcinoma, with squamous features, estrogen and progesterone receptor negative, HER-2 not amplified, with an MIB-1 of 10%   (3) left lumpectomy and sentinel lymph node biopsy 05/05/2014 showed aT1c pN0, stage IA invasive ductal carcinoma, grade 1, triple negative, with close but negative margins.   (4) Mammaprint classifies the tumor as basal like, high risk, and predicts a 30% chance of recurrence within 10 years with local treatment only.   (5) start of treatment delayed because of bilateral pneumothoraxes after port placement. adjuvant chemotherapy starting 07/11/2014 to consist of carboplatin and docetaxel given every 21 days 4 with Neulasta support.  (6) genetics testing March 2016 showed no deleterious mutation in the [employer] panel  [[employer]] including sequencing and rearrangement analysis for the following 24 genes:ATM, BARD1, BRCA1, BRCA2, BRIP1, CDH1, CHEK2, EPCAM, MLH1, MRE11A, MSH2, MSH6, MUTYH, NBN, NF1, PALB2, PMS2, PTEN, RAD50, RAD51C, RAD51D, SMARCA4, STK11, and TP53.   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     PLAN: Overall, Nitara did moderately well with her first cycle of chemotherapy. The labs were reviewed in detail and were entirely stable. For her neulasta related pain I advised she try to alternate an NSAID such as naproxen or ibuprofen with the tramadol. With her next cycle of chemotherapy, she might benefit from utilizing the claratin for a longer period of time. I advised that she be as active as possible this week, as the weather is better and this might help her energy level.  In the meantime, she will treat herself symptomatically for her ENT discomfort. She is going to go back to using her atrovent nasal spray, and will use  allegra, now she is out of her claratin window. She does not have an ear infection, but there may be some fluid build up behind the right TM.  It is possible her throat pain is related to reflux from the steroids, but she is already on nexium daily and has a history of prilosec not doing much for her.   Liat will return in 2 weeks for the start of cycle 2 of treatment. She understands and agrees with this plan. She knows the goal of treatment in her case is cure. She has been encouraged to call with any issues that might arise before her next visit here.   Laurie Panda, NP   07/18/2014 10:53 AM

## 2014-07-18 NOTE — Progress Notes (Unsigned)
Met with pt for 1st nadir check. Relate she is feeling more fatigued and has a "bad taste" in her mouth. Informed those are both normal symptoms from chemo. Denies needs at this time. Encourage pt to call with questions. Received verbal understanding.

## 2014-07-18 NOTE — Telephone Encounter (Signed)
Appointments made and avs printed for patient °

## 2014-07-29 ENCOUNTER — Other Ambulatory Visit (HOSPITAL_BASED_OUTPATIENT_CLINIC_OR_DEPARTMENT_OTHER): Payer: Medicare Other

## 2014-07-29 ENCOUNTER — Ambulatory Visit (HOSPITAL_BASED_OUTPATIENT_CLINIC_OR_DEPARTMENT_OTHER): Payer: Medicare Other | Admitting: Oncology

## 2014-07-29 VITALS — BP 119/50 | HR 80 | Temp 97.6°F | Resp 18 | Ht 66.0 in | Wt 142.6 lb

## 2014-07-29 DIAGNOSIS — C50512 Malignant neoplasm of lower-outer quadrant of left female breast: Secondary | ICD-10-CM

## 2014-07-29 DIAGNOSIS — Z853 Personal history of malignant neoplasm of breast: Secondary | ICD-10-CM | POA: Diagnosis not present

## 2014-07-29 DIAGNOSIS — R51 Headache: Secondary | ICD-10-CM

## 2014-07-29 DIAGNOSIS — C50911 Malignant neoplasm of unspecified site of right female breast: Secondary | ICD-10-CM

## 2014-07-29 DIAGNOSIS — F418 Other specified anxiety disorders: Secondary | ICD-10-CM

## 2014-07-29 DIAGNOSIS — C50912 Malignant neoplasm of unspecified site of left female breast: Secondary | ICD-10-CM

## 2014-07-29 DIAGNOSIS — Z171 Estrogen receptor negative status [ER-]: Secondary | ICD-10-CM | POA: Diagnosis not present

## 2014-07-29 DIAGNOSIS — M8580 Other specified disorders of bone density and structure, unspecified site: Secondary | ICD-10-CM

## 2014-07-29 LAB — COMPREHENSIVE METABOLIC PANEL (CC13)
ALBUMIN: 3.9 g/dL (ref 3.5–5.0)
ALT: 14 U/L (ref 0–55)
AST: 16 U/L (ref 5–34)
Alkaline Phosphatase: 105 U/L (ref 40–150)
Anion Gap: 10 mEq/L (ref 3–11)
BUN: 21.1 mg/dL (ref 7.0–26.0)
CALCIUM: 9.3 mg/dL (ref 8.4–10.4)
CHLORIDE: 104 meq/L (ref 98–109)
CO2: 26 meq/L (ref 22–29)
CREATININE: 1 mg/dL (ref 0.6–1.1)
EGFR: 53 mL/min/{1.73_m2} — AB (ref >90)
GLUCOSE: 103 mg/dL (ref 70–140)
POTASSIUM: 4.8 meq/L (ref 3.5–5.1)
Sodium: 140 mEq/L (ref 136–145)
Total Bilirubin: 0.39 mg/dL (ref 0.20–1.20)
Total Protein: 7.2 g/dL (ref 6.4–8.3)

## 2014-07-29 LAB — CBC WITH DIFFERENTIAL/PLATELET
BASO%: 0.6 % (ref 0.0–2.0)
Basophils Absolute: 0.1 10*3/uL (ref 0.0–0.1)
EOS ABS: 0 10*3/uL (ref 0.0–0.5)
EOS%: 0.2 % (ref 0.0–7.0)
HCT: 29.6 % — ABNORMAL LOW (ref 34.8–46.6)
HGB: 10.1 g/dL — ABNORMAL LOW (ref 11.6–15.9)
LYMPH%: 15 % (ref 14.0–49.7)
MCH: 30.1 pg (ref 25.1–34.0)
MCHC: 34 g/dL (ref 31.5–36.0)
MCV: 88.4 fL (ref 79.5–101.0)
MONO#: 1 10*3/uL — ABNORMAL HIGH (ref 0.1–0.9)
MONO%: 7.8 % (ref 0.0–14.0)
NEUT#: 9.6 10*3/uL — ABNORMAL HIGH (ref 1.5–6.5)
NEUT%: 76.4 % (ref 38.4–76.8)
Platelets: 237 10*3/uL (ref 145–400)
RBC: 3.35 10*6/uL — AB (ref 3.70–5.45)
RDW: 14.3 % (ref 11.2–14.5)
WBC: 12.6 10*3/uL — AB (ref 3.9–10.3)
lymph#: 1.9 10*3/uL (ref 0.9–3.3)

## 2014-07-29 MED ORDER — CYCLOSPORINE 0.05 % OP EMUL: 1.0000 [drp] | Freq: Two times a day (BID) | OPHTHALMIC | Status: DC

## 2014-07-29 NOTE — Progress Notes (Signed)
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 Non-of the 25 lymph nodes removed from the right axilla were involved. The tumor was estrogen and progesterone receptor positive, HER-2 not amplified. She was treated adjuvantly with doxorubicin and cyclophosphamide 4, then received 5 years of antiestrogen therapy (initially tamoxifen, later letrozole). There has been no evidence of disease recurrence on the right.  More recently, Laylee had routine left screening mamm[facility]. This showed a suspicious area of architectural distortion in the inferior portion of the left breast. On 85/46/2703 Timmi underwent left diagnostic mammography with ultrasonography. Spot compression views confirmed the presence 7 area of distortion in the lower central left breast, which was not palpable by physical exam (there was a prior biopsy scar in the upper outer quadrant of the left breast). Ultrasound confirmed a hypoechoic spiculated mass at the 6:00 location in the left breast, measuring maximally 1.3 cm. Sonography of the left axilla was negative.  Biopsy of the left breast mass in question 120 10/29/2014 showed ([id number]) an invasive breast cancer with squamous features. It was estrogen and progesterone receptor negative. There was no HER-2 amplification, the signals ratio being 0.82 and the number per cell 1.80. The  MIB-1 was 10%.  The patient has met with Dr. Donne Hazel in surgery and Dr. Pablo Ledger in radiation oncology. The patient understands there is no survival difference between lumpectomy and radiation compared with mastectomy. The patient will need sentinel lymph node sampling with either of those procedures. If she does have a lumpectomy, she will benefit from adjuvant radiation. The patient has been set up for genetics testing and this may affect her ultimate surgical decision.  Her subsequent history is as detailed below  INTERVAL HISTORY: Keerthi returns today for follow-up of her breast cancer, accompanied by her daughter, [name]. Today is day 19 cycle 1 of 4 planned cycles of cyclophosphamide and docetaxel, given every 21 days, with Neulasta on day 2.  REVIEW OF SYSTEMS: Joli has significant problems with her first cycle. She discuss this at the interim visit with my 74 assistant. They included severe headaches, bony pain from the Neulasta, sinus symptoms from going off the Zyrtec (so she could take loratadine) and constipation. She did not have problems with nausea or vomiting. Her energy level was moderate. She is not exercising at present. A detailed review of systems today was otherwise stable  PAST MEDICAL HISTORY: Past Medical History  Diagnosis Date  . OA (osteoarthritis)   . History of DVT (deep vein thrombosis)   . Hyperlipidemia   . Hypertension   . Hypothyroidism   . HX: breast cancer     melanoma  . Melanoma   . Eczema   . Arthritis   . Esophageal stricture   . GERD (gastroesophageal reflux disease)   . PONV (postoperative nausea and vomiting)   . Wears glasses   . Depression     states no depression  . Chronic kidney disease  some renal impairment from meds  . Headache   . Anemia     from medicaions  . Heart murmur     states it is benign told 30-40 years ago  . Pneumothorax on right 06/15/2014    PAST SURGICAL HISTORY: Past Surgical History  Procedure  Laterality Date  . Appendectomy  1967  . Melanoma excision  2004    right side of face  . 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 Total abdominal hysterectomy  1992    including ovaries  . Tonsillectomy and adenoidectomy  1949  . Lipoma excision  1978    right breast  . Mastectomy  1998    right-nodes out  . Breast lumpectomy      left breast-benign  . Breast surgery  1977    removal of calcified milk gland right breast  . Colonoscopy    . Radioactive seed guided mastectomy with axillary sentinel lymph node biopsy Left 05/05/2014    Procedure: RADIOACTIVE SEED GUIDED LEFT LUMPECTOMY WITH AXILLARY SENTINEL LYMPH NODE BIOPSY;  Surgeon: Rolm Bookbinder, MD;  Location: [facility];  Service: General;  Laterality: Left;  . Chest tube insertion  06/15/2014  . Portacath placement Left 06/15/2014    Procedure: INSERTION PORT-A-CATH;  Surgeon: Rolm Bookbinder, MD;  Location: [facility];  Service: General;  Laterality: Left;  . Chest tube insertion Bilateral 06/15/2014    Procedure: CHEST TUBE INSERTION;  Surgeon: Rolm Bookbinder, MD;  Location: [facility];  Service: General;  Laterality: Bilateral;    FAMILY HISTORY Family History  Problem Relation Age of Onset  . Heart disease Father 89    smoker  . Endometrial cancer Mother 63  . Breast cancer Cousin     x 3 cousins with breast cancer (one at 16, one at 73 and one at 24)  . Lung cancer Paternal Aunt     smoker  . Brain cancer Maternal Aunt   . Colon cancer Neg Hx   . Diabetes Maternal Aunt     ???  . Breast cancer Maternal Aunt 51  The patient's father died at the age of 55 from a myocardial infarction. The patient's mother died at the age of 61 from metastatic endometrial cancer which had been diagnosed 2 years before. The patient had no brothers, one sister. The patient's mother's sister was diagnosed with breast cancer but the patient does not know at what age. A second maternal aunt was diagnosed with brain cancer.  The patient has 3 cousins on her mother's side diagnosed with breast cancer, one of them under the age of 40. On the father's side there is a history of lung and cervical cancer  GYNECOLOGIC HISTORY:  No LMP recorded. Patient has had a hysterectomy. Menarche age 75, first live birth age 23. The patient is GX P1. She underwent hysterectomy with bilateral salpingo-oophorectomy in 1991. She used hormone replacement for approximately 7 years until 1998, when she had her right-sided breast cancer. She used oral contraceptives for approximately one year with no complications. 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 She is widowed and lives alone with no pets. Her main hobby is painting. Her daughter Faith Rogue lives in [place] and is also a homemaker. The patient has 2 grandchildren. She attends wholly [employer]  DIRECTIVES: In place. The patient's daughter [name] is her healthcare power of attorney. [name] can be reached at [phone]   HEALTH MAINTENANCE: History  Substance Use Topics  . Smoking status: Former Smoker -- 0.75 packs/day for 3 years    Types: Cigarettes    Quit date: 03/11/1969  . Smokeless tobacco: Never Used  . Alcohol Use: 0.0 oz/week    0 Standard drinks or equivalent per week     Comment: occasional wine     Colonoscopy: 20/10/[facility]  RAX:[facility] post hysterectomy  Bone density:2014/osteopenia  Lipid panel:  Allergies  Allergen Reactions  . Dyazide [Hydrochlorothiazide W-Triamterene]     Presumption: drug induced vasculitis  . Niacin Other (See Comments)    flushing  . Penicillins Diarrhea and Nausea And Vomiting    Current Outpatient Prescriptions  Medication Sig Dispense Refill  . acetaminophen (TYLENOL) 500 MG tablet Take 500 mg by mouth 2 (two) times daily as needed for mild pain.      [name] atorvastatin (LIPITOR) 20 MG tablet Take 1 tablet (20 mg total) by mouth daily. (Patient taking differently: Take 10 mg by mouth daily. ) 90 tablet 3  . Calcium-Vitamin D (CALTRATE 600 PLUS-VIT D PO) Take 1 tablet by mouth 2 (two) times daily.     . Carboxymethylcellulose Sodium (REFRESH LIQUIGEL OP) Apply 1 drop to eye at bedtime as needed (dry eyes).    . cycloSPORINE (RESTASIS) 0.05 % ophthalmic emulsion Place 1 drop into both eyes 2 (two) times daily. 30 mL 3  . dexamethasone (DECADRON) 4 MG tablet Take 2 tablets (8 mg total) by mouth 2 (two) times daily. Start the day before Taxotere. Then again the day after chemo for 3 days. (Patient not taking: Reported on 07/18/2014) 30 tablet 1  . docusate sodium (COLACE) 100 MG capsule Take 1 capsule (100 mg total) by mouth 2 (two) times daily. (Patient not taking: Reported on 06/29/2014) 40 capsule 0  . esomeprazole (NEXIUM) 40 MG capsule TAKE 1 CAPSULE DAILY (Patient taking differently: Take 40 mg by mouth daily. TAKE 1 CAPSULE DAILY) 90 capsule 3  . fexofenadine (ALLEGRA) 180 MG tablet Take 180 mg by mouth daily as needed for allergies.     [name] ipratropium (ATROVENT) 0.03 % nasal spray Place 2 sprays into the nose every 12 (twelve) hours. (Patient not taking: Reported on 07/18/2014) 180 mL 4  . Lactobacillus-Inulin (CULTURELLE DIGESTIVE HEALTH) CHEW Chew 1 capsule by mouth daily.     [name] levothyroxine (SYNTHROID, LEVOTHROID) 75 MCG tablet Take 1 tablet (75 mcg total) by mouth daily. 90 tablet 2  . lidocaine-prilocaine (EMLA) cream Apply to affected area once (Patient not taking: Reported on 07/18/2014) 30 g 3  . LORazepam (ATIVAN) 0.5 MG tablet 1/2 to 1 tab po q 8 hours prn 30 tablet 1  . nadolol (CORGARD) 20 MG tablet Take 0.5 tablets (10 mg total) by mouth daily. 90 tablet 1  . Polyethyl Glycol-Propyl Glycol (SYSTANE) 0.4-0.3 % SOLN Apply 1 drop to eye daily as needed (for dryness/itching).     . prochlorperazine (COMPAZINE) 10 MG tablet Take 1 tablet (10  mg total) by mouth every 6 (six) hours as needed (Nausea or vomiting). 30 tablet 1  . traMADol (ULTRAM) 50 MG tablet Take 1 tablet (50 mg total) by mouth every 6 (six) hours as needed for moderate pain or severe pain. 20 tablet 0  . venlafaxine XR (EFFEXOR-XR) 150 MG 24 hr capsule Take 1 capsule (150 mg total) by mouth daily. 180 capsule 0  No current facility-administered medications for this visit.    OBJECTIVE: Older white womanin who appears younger than stated age 72 Vitals:   07/29/14 1435  BP: 119/50  Pulse: 80  Temp: 97.6 F (36.4 C)  Resp: 18     Body mass index is 23.03 kg/(m^2).    ECOG FS:1 - Symptomatic but completely ambulatory  Sclerae unicteric, pupils round and equal Oropharynx clear and moist-- no thrush or other lesions No cervical or supraclavicular adenopathy Lungs no rales or rhonchi Heart regular rate and rhythm Abd soft, nontender, positive bowel sounds MSK no focal spinal tenderness, no upper extremity lymphedema Neuro: nonfocal, well oriented, appropriate affect Breasts: Deferred  LAB RESULTS:  CMP     Component Value Date/Time   NA 140 07/29/2014 1421   NA 138 06/14/2014 1025   K 4.8 07/29/2014 1421   K 4.1 06/14/2014 1025   CL 100 06/14/2014 1025   CO2 26 07/29/2014 1421   CO2 33* 06/14/2014 1025   GLUCOSE 103 07/29/2014 1421   GLUCOSE 79 06/14/2014 1025   GLUCOSE 88 02/06/2006 1059   BUN 21.1 07/29/2014 1421   BUN 16 06/14/2014 1025   CREATININE 1.0 07/29/2014 1421   CREATININE 0.97 06/14/2014 1025   CALCIUM 9.3 07/29/2014 1421   CALCIUM 9.9 06/14/2014 1025   PROT 7.2 07/29/2014 1421   PROT 8.5* 12/21/2013 1004   ALBUMIN 3.9 07/29/2014 1421   ALBUMIN 4.2 12/21/2013 1004   AST 16 07/29/2014 1421   AST 24 12/21/2013 1004   ALT 14 07/29/2014 1421   ALT 19 12/21/2013 1004   ALKPHOS 105 07/29/2014 1421   ALKPHOS 64 12/21/2013 1004   BILITOT 0.39 07/29/2014 1421   BILITOT 0.8 12/21/2013 1004   GFRNONAA 56* 06/14/2014 1025   GFRAA  65* 06/14/2014 1025    INo results found for: SPEP, UPEP  Lab Results  Component Value Date   WBC 12.6* 07/29/2014   NEUTROABS 9.6* 07/29/2014   HGB 10.1* 07/29/2014   HCT 29.6* 07/29/2014   MCV 88.4 07/29/2014   PLT 237 07/29/2014      Chemistry      Component Value Date/Time   NA 140 07/29/2014 1421   NA 138 06/14/2014 1025   K 4.8 07/29/2014 1421   K 4.1 06/14/2014 1025   CL 100 06/14/2014 1025   CO2 26 07/29/2014 1421   CO2 33* 06/14/2014 1025   BUN 21.1 07/29/2014 1421   BUN 16 06/14/2014 1025   CREATININE 1.0 07/29/2014 1421   CREATININE 0.97 06/14/2014 1025      Component Value Date/Time   CALCIUM 9.3 07/29/2014 1421   CALCIUM 9.9 06/14/2014 1025   ALKPHOS 105 07/29/2014 1421   ALKPHOS 64 12/21/2013 1004   AST 16 07/29/2014 1421   AST 24 12/21/2013 1004   ALT 14 07/29/2014 1421   ALT 19 12/21/2013 1004   BILITOT 0.39 07/29/2014 1421   BILITOT 0.8 12/21/2013 1004       No results found for: LABCA2  No components found for: [id number]  No results for input(s): INR in the last 168 hours.  Urinalysis    Component Value Date/Time   COLORURINE YELLOW 05/17/2013 1330   APPEARANCEUR CLEAR 05/17/2013 1330   LABSPEC 1.025 05/17/2013 1330   PHURINE 5.5 05/17/2013 1330   GLUCOSEU NEGATIVE 05/17/2013 1330   HGBUR NEGATIVE 05/17/2013 1330   BILIRUBINUR n 06/21/2013 1115   BILIRUBINUR NEGATIVE 05/17/2013 1330   KETONESUR NEGATIVE 05/17/2013 1330   PROTEINUR n 06/21/2013 1115  PROTEINUR NEGATIVE 05/17/2013 1330   UROBILINOGEN 0.2 06/21/2013 1115   UROBILINOGEN 0.2 05/17/2013 1330   NITRITE n 06/21/2013 1115   NITRITE NEGATIVE 05/17/2013 1330   LEUKOCYTESUR Trace 06/21/2013 1115    STUDIES: No results found.  ASSESSMENT: 74 y.o. [place] woman  (1) status post left modified radical mastectomy November 1998 for a pT2 pN0, stage IIA invasive ductal carcinoma, grade 2, estrogen and progesterone receptor positive, HER-2 not amplified  (a) adjuvant  chemotherapy consisted of doxorubicin and cyclophosphamide 4  (b) adjuvant anti-estrogens consisted of tamoxifen, then letrozole, for a total of 5 years  (2) status post left breast lower outer quadrant biopsy 04/07/2014 for a clinical T1c N0,stage IA  invasive ductal carcinoma, with squamous features, estrogen and progesterone receptor negative, HER-2 not amplified, with an MIB-1 of 10%   (3) left lumpectomy and sentinel lymph node biopsy 05/05/2014 showed aT1c pN0, stage IA invasive ductal carcinoma, grade 1, triple negative, with close but negative margins.   (4) Mammaprint classifies the tumor as basal like, high risk, and predicts a 30% chance of recurrence within 10 years with local treatment only.   (5) start of treatment delayed because of bilateral pneumothoraces after port placement. adjuvant chemotherapy starting 07/11/2014 to consist of carboplatin and docetaxel given every 21 days 4 with Neulasta support.  (6) genetics testing March 2016 showed no deleterious mutation in the [employer] panel  [[employer] [employer]] including sequencing and rearrangement analysis for the following 24 genes:ATM, BARD1, BRCA1, BRCA2, BRIP1, CDH1, CHEK2, EPCAM, MLH1, MRE11A, MSH2, MSH6, MUTYH, NBN, NF1, PALB2, PMS2, PTEN, RAD50, RAD51C, RAD51D, SMARCA4, STK11, and TP53.   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    PLAN: Alyria did moderately well with her first cycle, and she has a good understanding of the suggestions made by our physician's assistant at the in between treatment visit. Today I reinforced those, and also added Restasis eyedrops to see if we can get ahead of epiphora and dry eye which she is beginning to experience  I suggested for the pain that she may or may not have when she next has Neulasta she take tramadol and Aleve together. We are changing from onpro to a Neulasta shot in case that makes a difference. I also wrote her an  undated prescription for hydrocodone/APAP if a stronger pain medication becomes necessary.  Given her severe headache, although the timing was not quite right for ondansetron to be the cause, that medication is clearly associated with headaches so we are eliminating that. She will continue to use the Decadron and promethazine for nausea control. She is doing a good job regarding constipation management and has a good bowel prophylaxis regimen. She does have some sinus symptoms and we discussed how best to deal with that. At this point I do not believe that she will need an antibiotic.  Hopefully with these changes she will do better with her second cycle, due to early next week. She will let us know how it works when we see her at the next interim visit.  Chauncey Cruel, MD   07/30/2014 10:13 AM

## 2014-07-30 MED ORDER — VENLAFAXINE HCL ER 150 MG PO CP24: 150.0000 mg | ORAL_CAPSULE | Freq: Every day | ORAL | Status: DC

## 2014-08-01 ENCOUNTER — Other Ambulatory Visit (HOSPITAL_BASED_OUTPATIENT_CLINIC_OR_DEPARTMENT_OTHER): Payer: Medicare Other

## 2014-08-01 ENCOUNTER — Encounter: Payer: Self-pay | Admitting: Oncology

## 2014-08-01 ENCOUNTER — Ambulatory Visit (HOSPITAL_BASED_OUTPATIENT_CLINIC_OR_DEPARTMENT_OTHER): Payer: Medicare Other

## 2014-08-01 VITALS — BP 141/64 | HR 71 | Temp 97.7°F | Resp 18

## 2014-08-01 DIAGNOSIS — C50512 Malignant neoplasm of lower-outer quadrant of left female breast: Secondary | ICD-10-CM

## 2014-08-01 DIAGNOSIS — C50912 Malignant neoplasm of unspecified site of left female breast: Secondary | ICD-10-CM

## 2014-08-01 DIAGNOSIS — Z5111 Encounter for antineoplastic chemotherapy: Secondary | ICD-10-CM | POA: Diagnosis not present

## 2014-08-01 DIAGNOSIS — C50911 Malignant neoplasm of unspecified site of right female breast: Secondary | ICD-10-CM

## 2014-08-01 LAB — COMPREHENSIVE METABOLIC PANEL (CC13)
ALT: 14 U/L (ref 0–55)
ANION GAP: 18 meq/L — AB (ref 3–11)
AST: 14 U/L (ref 5–34)
Albumin: 4.2 g/dL (ref 3.5–5.0)
Alkaline Phosphatase: 95 U/L (ref 40–150)
BUN: 23.7 mg/dL (ref 7.0–26.0)
CO2: 21 mEq/L — ABNORMAL LOW (ref 22–29)
CREATININE: 1.1 mg/dL (ref 0.6–1.1)
Calcium: 10 mg/dL (ref 8.4–10.4)
Chloride: 101 mEq/L (ref 98–109)
EGFR: 47 mL/min/{1.73_m2} — ABNORMAL LOW (ref >90)
GLUCOSE: 202 mg/dL — AB (ref 70–140)
Potassium: 4.2 mEq/L (ref 3.5–5.1)
Sodium: 139 mEq/L (ref 136–145)
Total Bilirubin: 0.41 mg/dL (ref 0.20–1.20)
Total Protein: 7.6 g/dL (ref 6.4–8.3)

## 2014-08-01 LAB — CBC WITH DIFFERENTIAL/PLATELET
BASO%: 0.1 % (ref 0.0–2.0)
Basophils Absolute: 0 10*3/uL (ref 0.0–0.1)
EOS%: 0 % (ref 0.0–7.0)
Eosinophils Absolute: 0 10*3/uL (ref 0.0–0.5)
HCT: 31.1 % — ABNORMAL LOW (ref 34.8–46.6)
HEMOGLOBIN: 10.2 g/dL — AB (ref 11.6–15.9)
LYMPH%: 6.2 % — AB (ref 14.0–49.7)
MCH: 30 pg (ref 25.1–34.0)
MCHC: 32.8 g/dL (ref 31.5–36.0)
MCV: 91.5 fL (ref 79.5–101.0)
MONO#: 0.3 10*3/uL (ref 0.1–0.9)
MONO%: 1.7 % (ref 0.0–14.0)
NEUT#: 18.8 10*3/uL — ABNORMAL HIGH (ref 1.5–6.5)
NEUT%: 92 % — ABNORMAL HIGH (ref 38.4–76.8)
PLATELETS: 280 10*3/uL (ref 145–400)
RBC: 3.4 10*6/uL — ABNORMAL LOW (ref 3.70–5.45)
RDW: 15.7 % — AB (ref 11.2–14.5)
WBC: 20.4 10*3/uL — AB (ref 3.9–10.3)
lymph#: 1.3 10*3/uL (ref 0.9–3.3)

## 2014-08-01 MED ORDER — CARBOPLATIN CHEMO INJECTION 450 MG/45ML
356.5000 mg | Freq: Once | INTRAVENOUS | Status: AC
  Administered 2014-08-01: 360 mg via INTRAVENOUS
  Filled 2014-08-01: qty 36

## 2014-08-01 MED ORDER — SODIUM CHLORIDE 0.9 % IV SOLN
Freq: Once | INTRAVENOUS | Status: AC
  Administered 2014-08-01: 12:00:00 via INTRAVENOUS

## 2014-08-01 MED ORDER — DOCETAXEL CHEMO INJECTION 160 MG/16ML
75.0000 mg/m2 | Freq: Once | INTRAVENOUS | Status: AC
  Administered 2014-08-01: 130 mg via INTRAVENOUS
  Filled 2014-08-01: qty 13

## 2014-08-01 MED ORDER — SODIUM CHLORIDE 0.9 % IJ SOLN
10.0000 mL | INTRAMUSCULAR | Status: DC | PRN
  Administered 2014-08-01: 10 mL
  Filled 2014-08-01: qty 10

## 2014-08-01 MED ORDER — SODIUM CHLORIDE 0.9 % IV SOLN
Freq: Once | INTRAVENOUS | Status: AC
  Administered 2014-08-01: 13:00:00 via INTRAVENOUS
  Filled 2014-08-01: qty 8

## 2014-08-01 MED ORDER — HEPARIN SOD (PORK) LOCK FLUSH 100 UNIT/ML IV SOLN
500.0000 [IU] | Freq: Once | INTRAVENOUS | Status: AC | PRN
  Administered 2014-08-01: 500 [IU]
  Filled 2014-08-01: qty 5

## 2014-08-01 MED ORDER — PEGFILGRASTIM 6 MG/0.6ML ~~LOC~~ PSKT
6.0000 mg | PREFILLED_SYRINGE | Freq: Once | SUBCUTANEOUS | Status: DC
  Filled 2014-08-01: qty 0.6

## 2014-08-01 NOTE — Progress Notes (Signed)
I sent prior auth to [facility] for restasis

## 2014-08-01 NOTE — Patient Instructions (Signed)
[facility] Discharge Instructions for Patients Receiving Chemotherapy  Today you received the following chemotherapy agents taxotere, carboplatin  To help prevent nausea and vomiting after your treatment, we encourage you to take your nausea medication as needed.   If you develop nausea and vomiting that is not controlled by your nausea medication, call the clinic.   BELOW ARE SYMPTOMS THAT SHOULD BE REPORTED IMMEDIATELY:  *FEVER GREATER THAN 100.5 F  *CHILLS WITH OR WITHOUT FEVER  NAUSEA AND VOMITING THAT IS NOT CONTROLLED WITH YOUR NAUSEA MEDICATION  *UNUSUAL SHORTNESS OF BREATH  *UNUSUAL BRUISING OR BLEEDING  TENDERNESS IN MOUTH AND THROAT WITH OR WITHOUT PRESENCE OF ULCERS  *URINARY PROBLEMS  *BOWEL PROBLEMS  UNUSUAL RASH Items with * indicate a potential emergency and should be followed up as soon as possible.  Feel free to call the clinic you have any questions or concerns. The clinic phone number is [phone].  Please show the [facility] at check-in to the Emergency Department and triage nurse.

## 2014-08-01 NOTE — Progress Notes (Signed)
Per [facility] Effective from 08/01/2014 through 08/01/2015 auth for [employer]

## 2014-08-02 ENCOUNTER — Other Ambulatory Visit: Payer: Self-pay | Admitting: Oncology

## 2014-08-02 ENCOUNTER — Ambulatory Visit (HOSPITAL_BASED_OUTPATIENT_CLINIC_OR_DEPARTMENT_OTHER): Payer: Medicare Other

## 2014-08-02 ENCOUNTER — Ambulatory Visit: Payer: Medicare Other

## 2014-08-02 VITALS — BP 118/51 | HR 65 | Temp 97.8°F | Resp 18

## 2014-08-02 DIAGNOSIS — Z5189 Encounter for other specified aftercare: Secondary | ICD-10-CM | POA: Diagnosis not present

## 2014-08-02 DIAGNOSIS — C50911 Malignant neoplasm of unspecified site of right female breast: Secondary | ICD-10-CM

## 2014-08-02 DIAGNOSIS — C50512 Malignant neoplasm of lower-outer quadrant of left female breast: Secondary | ICD-10-CM | POA: Diagnosis present

## 2014-08-02 DIAGNOSIS — C50912 Malignant neoplasm of unspecified site of left female breast: Secondary | ICD-10-CM

## 2014-08-02 MED ORDER — PEGFILGRASTIM INJECTION 6 MG/0.6ML ~~LOC~~
6.0000 mg | PREFILLED_SYRINGE | Freq: Once | SUBCUTANEOUS | Status: AC
  Administered 2014-08-02: 6 mg via SUBCUTANEOUS
  Filled 2014-08-02: qty 0.6

## 2014-08-04 ENCOUNTER — Other Ambulatory Visit: Payer: Self-pay | Admitting: *Deleted

## 2014-08-09 ENCOUNTER — Encounter: Payer: Self-pay | Admitting: Nurse Practitioner

## 2014-08-09 ENCOUNTER — Ambulatory Visit (HOSPITAL_BASED_OUTPATIENT_CLINIC_OR_DEPARTMENT_OTHER): Payer: Medicare Other | Admitting: Nurse Practitioner

## 2014-08-09 ENCOUNTER — Other Ambulatory Visit: Payer: Self-pay | Admitting: *Deleted

## 2014-08-09 ENCOUNTER — Other Ambulatory Visit (HOSPITAL_BASED_OUTPATIENT_CLINIC_OR_DEPARTMENT_OTHER): Payer: Medicare Other

## 2014-08-09 VITALS — BP 135/63 | HR 88 | Temp 97.5°F | Resp 18 | Ht 66.0 in | Wt 138.4 lb

## 2014-08-09 DIAGNOSIS — C50512 Malignant neoplasm of lower-outer quadrant of left female breast: Secondary | ICD-10-CM | POA: Diagnosis not present

## 2014-08-09 DIAGNOSIS — R358 Other polyuria: Principal | ICD-10-CM

## 2014-08-09 DIAGNOSIS — R3589 Other polyuria: Secondary | ICD-10-CM

## 2014-08-09 DIAGNOSIS — D6481 Anemia due to antineoplastic chemotherapy: Secondary | ICD-10-CM

## 2014-08-09 DIAGNOSIS — C50912 Malignant neoplasm of unspecified site of left female breast: Secondary | ICD-10-CM

## 2014-08-09 DIAGNOSIS — K5730 Diverticulosis of large intestine without perforation or abscess without bleeding: Secondary | ICD-10-CM | POA: Diagnosis not present

## 2014-08-09 DIAGNOSIS — R350 Frequency of micturition: Secondary | ICD-10-CM

## 2014-08-09 DIAGNOSIS — Z853 Personal history of malignant neoplasm of breast: Secondary | ICD-10-CM | POA: Diagnosis not present

## 2014-08-09 DIAGNOSIS — K5792 Diverticulitis of intestine, part unspecified, without perforation or abscess without bleeding: Secondary | ICD-10-CM

## 2014-08-09 DIAGNOSIS — Z171 Estrogen receptor negative status [ER-]: Secondary | ICD-10-CM

## 2014-08-09 DIAGNOSIS — C50911 Malignant neoplasm of unspecified site of right female breast: Secondary | ICD-10-CM

## 2014-08-09 DIAGNOSIS — D630 Anemia in neoplastic disease: Secondary | ICD-10-CM

## 2014-08-09 LAB — CBC WITH DIFFERENTIAL/PLATELET
BASO%: 0.2 % (ref 0.0–2.0)
Basophils Absolute: 0 10*3/uL (ref 0.0–0.1)
EOS%: 0.4 % (ref 0.0–7.0)
Eosinophils Absolute: 0 10*3/uL (ref 0.0–0.5)
HEMATOCRIT: 29.1 % — AB (ref 34.8–46.6)
HGB: 10 g/dL — ABNORMAL LOW (ref 11.6–15.9)
LYMPH%: 10.6 % — AB (ref 14.0–49.7)
MCH: 30.3 pg (ref 25.1–34.0)
MCHC: 34.2 g/dL (ref 31.5–36.0)
MCV: 88.7 fL (ref 79.5–101.0)
MONO#: 1.1 10*3/uL — AB (ref 0.1–0.9)
MONO%: 8.2 % (ref 0.0–14.0)
NEUT#: 10.4 10*3/uL — ABNORMAL HIGH (ref 1.5–6.5)
NEUT%: 80.6 % — AB (ref 38.4–76.8)
PLATELETS: 128 10*3/uL — AB (ref 145–400)
RBC: 3.28 10*6/uL — AB (ref 3.70–5.45)
RDW: 16.3 % — ABNORMAL HIGH (ref 11.2–14.5)
WBC: 12.9 10*3/uL — ABNORMAL HIGH (ref 3.9–10.3)
lymph#: 1.4 10*3/uL (ref 0.9–3.3)

## 2014-08-09 LAB — URINALYSIS, MICROSCOPIC - CHCC
BLOOD: NEGATIVE
Bilirubin (Urine): NEGATIVE
Glucose: NEGATIVE mg/dL
Ketones: NEGATIVE mg/dL
Leukocyte Esterase: NEGATIVE
Nitrite: NEGATIVE
PH: 7 (ref 4.6–8.0)
PROTEIN: NEGATIVE mg/dL
RBC / HPF: NEGATIVE (ref 0–2)
SPECIFIC GRAVITY, URINE: 1.005 (ref 1.003–1.035)
UROBILINOGEN UR: 0.2 mg/dL (ref 0.2–1)

## 2014-08-09 LAB — COMPREHENSIVE METABOLIC PANEL (CC13)
ALBUMIN: 3.6 g/dL (ref 3.5–5.0)
ALK PHOS: 85 U/L (ref 40–150)
ALT: 14 U/L (ref 0–55)
AST: 18 U/L (ref 5–34)
Anion Gap: 11 mEq/L (ref 3–11)
BUN: 18 mg/dL (ref 7.0–26.0)
CO2: 27 mEq/L (ref 22–29)
Calcium: 10 mg/dL (ref 8.4–10.4)
Chloride: 100 mEq/L (ref 98–109)
Creatinine: 1.1 mg/dL (ref 0.6–1.1)
EGFR: 51 mL/min/{1.73_m2} — ABNORMAL LOW (ref >90)
Glucose: 119 mg/dl (ref 70–140)
POTASSIUM: 4.3 meq/L (ref 3.5–5.1)
Sodium: 138 mEq/L (ref 136–145)
Total Bilirubin: 0.63 mg/dL (ref 0.20–1.20)
Total Protein: 7 g/dL (ref 6.4–8.3)

## 2014-08-09 MED ORDER — METRONIDAZOLE 500 MG PO TABS: 500.0000 mg | ORAL_TABLET | Freq: Three times a day (TID) | ORAL | Status: DC

## 2014-08-09 MED ORDER — CIPROFLOXACIN HCL 500 MG PO TABS: 500.0000 mg | ORAL_TABLET | Freq: Two times a day (BID) | ORAL | Status: DC

## 2014-08-09 NOTE — Progress Notes (Signed)
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 Non-of the 25 lymph nodes removed from the right axilla were involved. The tumor was estrogen and progesterone receptor positive, HER-2 not amplified. She was treated adjuvantly with doxorubicin and cyclophosphamide 4, then received 5 years of antiestrogen therapy (initially tamoxifen, later letrozole). There has been no evidence of disease recurrence on the right.  More recently, Laura Li had routine left screening [facility]. This showed a suspicious area of architectural distortion in the inferior portion of the left breast. On 54/00/8676 Laura Li underwent left diagnostic mammography with ultrasonography. Spot compression views confirmed the presence 7 area of distortion in the lower central left breast, which was not palpable by physical exam (there was a prior biopsy scar in the upper outer quadrant of the left breast). Ultrasound confirmed a hypoechoic spiculated mass at the 6:00 location in the left breast, measuring maximally 1.3 cm. Sonography of the left axilla was negative.  Biopsy of the left breast mass in question 120 10/29/2014 showed ([id number]) an invasive breast cancer with squamous features. It was estrogen and progesterone receptor negative. There was no HER-2 amplification, the signals ratio being 0.82 and the number per cell 1.80. The  MIB-1 was 10%.  The patient has met with Laura Li in surgery and Laura Li in radiation oncology. The patient understands there is no survival difference between lumpectomy and radiation compared with mastectomy. The patient will need sentinel lymph node sampling with either of those procedures. If she does have a lumpectomy, she will benefit from adjuvant radiation. The patient has been set up for genetics testing and this may affect her ultimate surgical decision.  Her subsequent history is as detailed below  INTERVAL HISTORY: Laura Li returns today for follow-up of her breast cancer, accompanied by her daughter, Laura Li. Today is day 9 cycle 2 of 4 planned cycles of cyclophosphamide and docetaxel, given every 21 days, with Neulasta on day 2.  REVIEW OF SYSTEMS: Laura Li denies fevers, chills, nausea, or vomiting. She had constipation at first that turned into diarrhea most recently. She had intense left lower quadrant pain complete with spasms when it was at its worst. She has been urinating frequently, but denies dysuria or odor. The fatigue bothers her daughter, who was concerned she she visited this past weekend, and the patient was barely moving. She is down 4lb and her appetite is fair. She drinking about 32oz daily. She denies mouth sores, rashes, or peripheral neuropathy symptoms. She had no headaches this cycle. And her bony pain from the neulasta was minimal. A detailed review of systems is otherwise stable.  PAST MEDICAL HISTORY: Past Medical History  Diagnosis Date  . OA (osteoarthritis)   . History of DVT (deep vein thrombosis)   . Hyperlipidemia   . Hypertension   . Hypothyroidism   . HX: breast cancer     melanoma  . Melanoma   . Eczema   . Arthritis   .  Esophageal stricture   . GERD (gastroesophageal reflux disease)   . PONV (postoperative nausea and vomiting)   . Wears glasses   . Depression     states no depression  . Chronic kidney disease     some renal impairment  from meds  . Headache   . Anemia     from medicaions  . Heart murmur     states it is benign told 30-40 years ago  . Pneumothorax on right 06/15/2014    PAST SURGICAL HISTORY: Past Surgical History  Procedure Laterality Date  . Appendectomy  1967  . Melanoma excision  2004    right side of face  . 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 Total abdominal hysterectomy  1992    including ovaries  . Tonsillectomy and adenoidectomy  1949  . Lipoma excision  1978    right breast  . Mastectomy  1998    right-nodes out  . Breast lumpectomy      left breast-benign  . Breast surgery  1977    removal of calcified milk gland right breast  . Colonoscopy    . Radioactive seed guided mastectomy with axillary sentinel lymph node biopsy Left 05/05/2014    Procedure: RADIOACTIVE SEED GUIDED LEFT LUMPECTOMY WITH AXILLARY SENTINEL LYMPH NODE BIOPSY;  Surgeon: Laura Bookbinder, MD;  Location: [facility];  Service: General;  Laterality: Left;  . Chest tube insertion  06/15/2014  . Portacath placement Left 06/15/2014    Procedure: INSERTION PORT-A-CATH;  Surgeon: Laura Bookbinder, MD;  Location: [facility];  Service: General;  Laterality: Left;  . Chest tube insertion Bilateral 06/15/2014    Procedure: CHEST TUBE INSERTION;  Surgeon: Laura Bookbinder, MD;  Location: [facility];  Service: General;  Laterality: Bilateral;    FAMILY HISTORY Family History  Problem Relation Age of Onset  . Heart disease Father 76    smoker  . Endometrial cancer Mother 3  . Breast cancer Cousin     x 3 cousins with breast cancer (one at 48, one at 56 and one at 66)  . Lung cancer Paternal Aunt     smoker  . Brain cancer Maternal Aunt   . Colon cancer Neg Hx   . Diabetes Maternal Aunt     ???  . Breast cancer Maternal Aunt 51  The patient's father died at the age of 104 from a myocardial infarction. The patient's mother died at the age of 53 from metastatic endometrial cancer which had been diagnosed  2 years before. The patient had no brothers, one sister. The patient's mother's sister was diagnosed with breast cancer but the patient does not know at what age. A second maternal aunt was diagnosed with brain cancer. The patient has 3 cousins on her mother's side diagnosed with breast cancer, one of them under the age of 51. On the father's side there is a history of lung and cervical cancer  GYNECOLOGIC HISTORY:  No LMP recorded. Patient has had a hysterectomy. Menarche age 58, first live birth age 66. The patient is GX P1. She underwent hysterectomy with bilateral salpingo-oophorectomy in 1991. She used hormone replacement for approximately 7 years until 1998, when she had her right-sided breast cancer. She used oral contraceptives for approximately one year with no complications. 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 She is widowed and lives alone with no pets. Her main hobby is painting. Her daughter Faith Rogue lives in [place] and is also a homemaker. The patient has 2 grandchildren. She attends [employer]. The patient's daughter Laura Li is her healthcare power of attorney. Laura Li can be reached at [phone]   HEALTH MAINTENANCE: History  Substance Use Topics  . Smoking status: Former Smoker -- 0.75 packs/day for 3 years    Types: Cigarettes    Quit date: 03/11/1969  . Smokeless tobacco: Never Used  . Alcohol Use: 0.0 oz/week    0 Standard drinks or equivalent per week     Comment: occasional wine     Colonoscopy: 20/10/John Henrene Pastor  LKJ:ZPHXTA post hysterectomy  Bone density:2014/osteopenia  Lipid panel:  Allergies  Allergen Reactions  . Dyazide [Hydrochlorothiazide W-Triamterene]     Presumption: drug induced vasculitis  . Niacin Other (See Comments)    flushing  .  Penicillins Diarrhea and Nausea And Vomiting    Current Outpatient Prescriptions  Medication Sig Dispense Refill  . atorvastatin (LIPITOR) 20 MG tablet Take 1 tablet (20 mg total) by mouth daily. (Patient taking differently: Take 10 mg by mouth daily. ) 90 tablet 3  . Calcium-Vitamin D (CALTRATE 600 PLUS-VIT D PO) Take 1 tablet by mouth 2 (two) times daily.     . Carboxymethylcellulose Sodium (REFRESH LIQUIGEL OP) Apply 1 drop to eye at bedtime as needed (dry eyes).    . cycloSPORINE (RESTASIS) 0.05 % ophthalmic emulsion Place 1 drop into both eyes 2 (two) times daily. 30 mL 3  . dexamethasone (DECADRON) 4 MG tablet Take 2 tablets (8 mg total) by mouth 2 (two) times daily. Start the day before Taxotere. Then again the day after chemo for 3 days. 30 tablet 1  . docusate sodium (COLACE) 100 MG capsule Take 1 capsule (100 mg total) by mouth 2 (two) times daily. 40 capsule 0  . esomeprazole (NEXIUM) 40 MG capsule TAKE 1 CAPSULE DAILY (Patient taking differently: Take 40 mg by mouth daily. TAKE 1 CAPSULE DAILY) 90 capsule 3  . Lactobacillus-Inulin (CULTURELLE DIGESTIVE HEALTH) CHEW Chew 1 capsule by mouth daily.     [name] levothyroxine (SYNTHROID, LEVOTHROID) 75 MCG tablet Take 1 tablet (75 mcg total) by mouth daily. 90 tablet 2  . lidocaine-prilocaine (EMLA) cream Apply to affected area once 30 g 3  . LORazepam (ATIVAN) 0.5 MG tablet 1/2 to 1 tab po q 8 hours prn 30 tablet 1  . nadolol (CORGARD) 20 MG tablet Take 0.5 tablets (10 mg total) by mouth daily. 90 tablet 1  . prochlorperazine (COMPAZINE) 10 MG tablet Take 1 tablet (10 mg total) by mouth every 6 (six) hours as needed (Nausea or vomiting). 30 tablet 1  . venlafaxine XR (EFFEXOR-XR) 150 MG 24 hr capsule Take 1 capsule (150 mg total) by mouth daily. 180 capsule 0  . acetaminophen (TYLENOL) 500 MG tablet Take 500 mg by mouth 2 (two) times daily as needed for mild pain.     . ciprofloxacin (CIPRO) 500 MG tablet Take 1 tablet (500 mg total) by mouth 2  (two) times daily. 20 tablet 0  . fexofenadine (ALLEGRA) 180 MG tablet Take 180 mg by mouth daily as needed for allergies.     [name] ipratropium (ATROVENT) 0.03 % nasal spray Place 2 sprays into the nose every 12 (twelve) hours. (Patient not taking: Reported on 07/18/2014) 180 mL 4  . metroNIDAZOLE (FLAGYL) 500 MG  tablet Take 1 tablet (500 mg total) by mouth 3 (three) times daily. 30 tablet 0  . Polyethyl Glycol-Propyl Glycol (SYSTANE) 0.4-0.3 % SOLN Apply 1 drop to eye daily as needed (for dryness/itching).     . traMADol (ULTRAM) 50 MG tablet TAKE 1 TABLET BY MOUTH EVERY 6 HOURS AS NEEDED FOR MODERATE/SEVERE PAIN (Patient not taking: Reported on 08/09/2014) 20 tablet 0   No current facility-administered medications for this visit.    OBJECTIVE: Older white womanin who appears younger than stated age 89 Vitals:   08/09/14 0945  BP: 135/63  Pulse: 88  Temp: 97.5 F (36.4 C)  Resp: 18     Body mass index is 22.35 kg/(m^2).    ECOG FS:1 - Symptomatic but completely ambulatory  Skin: warm, dry  HEENT: sclerae anicteric, conjunctivae pink, oropharynx clear. No thrush or mucositis.  Lymph Nodes: No cervical or supraclavicular lymphadenopathy  Lungs: clear to auscultation bilaterally, no rales, wheezes, or rhonci  Heart: regular rate and rhythm  Abdomen: round, soft, non tender, positive bowel sounds  Musculoskeletal: No focal spinal tenderness, no peripheral edema  Neuro: non focal, well oriented, positive affect  Breasts: deferred  LAB RESULTS:  CMP     Component Value Date/Time   NA 138 08/09/2014 0931   NA 138 06/14/2014 1025   K 4.3 08/09/2014 0931   K 4.1 06/14/2014 1025   CL 100 06/14/2014 1025   CO2 27 08/09/2014 0931   CO2 33* 06/14/2014 1025   GLUCOSE 119 08/09/2014 0931   GLUCOSE 79 06/14/2014 1025   GLUCOSE 88 02/06/2006 1059   BUN 18.0 08/09/2014 0931   BUN 16 06/14/2014 1025   CREATININE 1.1 08/09/2014 0931   CREATININE 0.97 06/14/2014 1025   CALCIUM 10.0  08/09/2014 0931   CALCIUM 9.9 06/14/2014 1025   PROT 7.0 08/09/2014 0931   PROT 8.5* 12/21/2013 1004   ALBUMIN 3.6 08/09/2014 0931   ALBUMIN 4.2 12/21/2013 1004   AST 18 08/09/2014 0931   AST 24 12/21/2013 1004   ALT 14 08/09/2014 0931   ALT 19 12/21/2013 1004   ALKPHOS 85 08/09/2014 0931   ALKPHOS 64 12/21/2013 1004   BILITOT 0.63 08/09/2014 0931   BILITOT 0.8 12/21/2013 1004   GFRNONAA 56* 06/14/2014 1025   GFRAA 65* 06/14/2014 1025    INo results found for: SPEP, UPEP  Lab Results  Component Value Date   WBC 12.9* 08/09/2014   NEUTROABS 10.4* 08/09/2014   HGB 10.0* 08/09/2014   HCT 29.1* 08/09/2014   MCV 88.7 08/09/2014   PLT 128* 08/09/2014      Chemistry      Component Value Date/Time   NA 138 08/09/2014 0931   NA 138 06/14/2014 1025   K 4.3 08/09/2014 0931   K 4.1 06/14/2014 1025   CL 100 06/14/2014 1025   CO2 27 08/09/2014 0931   CO2 33* 06/14/2014 1025   BUN 18.0 08/09/2014 0931   BUN 16 06/14/2014 1025   CREATININE 1.1 08/09/2014 0931   CREATININE 0.97 06/14/2014 1025      Component Value Date/Time   CALCIUM 10.0 08/09/2014 0931   CALCIUM 9.9 06/14/2014 1025   ALKPHOS 85 08/09/2014 0931   ALKPHOS 64 12/21/2013 1004   AST 18 08/09/2014 0931   AST 24 12/21/2013 1004   ALT 14 08/09/2014 0931   ALT 19 12/21/2013 1004   BILITOT 0.63 08/09/2014 0931   BILITOT 0.8 12/21/2013 1004       No results found for: LABCA2  No  components found for: [id number]  No results for input(s): INR in the last 168 hours.  Urinalysis    Component Value Date/Time   COLORURINE YELLOW 05/17/2013 1330   APPEARANCEUR CLEAR 05/17/2013 1330   LABSPEC 1.025 05/17/2013 1330   PHURINE 5.5 05/17/2013 1330   GLUCOSEU NEGATIVE 05/17/2013 1330   HGBUR NEGATIVE 05/17/2013 1330   BILIRUBINUR n 06/21/2013 1115   BILIRUBINUR NEGATIVE 05/17/2013 1330   KETONESUR NEGATIVE 05/17/2013 1330   PROTEINUR n 06/21/2013 1115   PROTEINUR NEGATIVE 05/17/2013 1330   UROBILINOGEN 0.2  06/21/2013 1115   UROBILINOGEN 0.2 05/17/2013 1330   NITRITE n 06/21/2013 1115   NITRITE NEGATIVE 05/17/2013 1330   LEUKOCYTESUR Trace 06/21/2013 1115    STUDIES: No results found.  ASSESSMENT: 74 y.o. [place] woman  (1) status post left modified radical mastectomy November 1998 for a pT2 pN0, stage IIA invasive ductal carcinoma, grade 2, estrogen and progesterone receptor positive, HER-2 not amplified  (a) adjuvant chemotherapy consisted of doxorubicin and cyclophosphamide 4  (b) adjuvant anti-estrogens consisted of tamoxifen, then letrozole, for a total of 5 years  (2) status post left breast lower outer quadrant biopsy 04/07/2014 for a clinical T1c N0,stage IA  invasive ductal carcinoma, with squamous features, estrogen and progesterone receptor negative, HER-2 not amplified, with an MIB-1 of 10%   (3) left lumpectomy and sentinel lymph node biopsy 05/05/2014 showed aT1c pN0, stage IA invasive ductal carcinoma, grade 1, triple negative, with close but negative margins.   (4) Mammaprint classifies the tumor as basal like, high risk, and predicts a 30% chance of recurrence within 10 years with local treatment only.   (5) start of treatment delayed because of bilateral pneumothoraces after port placement. adjuvant chemotherapy starting 07/11/2014 to consist of carboplatin and docetaxel given every 21 days 4 with Neulasta support.  (6) genetics testing March 2016 showed no deleterious mutation in the [employer] panel  [[employer]] including sequencing and rearrangement analysis for the following 24 genes:ATM, BARD1, BRCA1, BRCA2, BRIP1, CDH1, CHEK2, EPCAM, MLH1, MRE11A, MSH2, MSH6, MUTYH, NBN, NF1, PALB2, PMS2, PTEN, RAD50, RAD51C, RAD51D, SMARCA4, STK11, and TP53.   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    PLAN: The labs were reviewed in detail and were entirely stable. She has continued treatment related  anemia with an hgb of 10.0. We will simply continue to monitor this value. The overwhelming fatigue this past Saturday was more likely to be be due to lack of appetite and mild dehydration. I have encouraged her to increase her fluids and work on protein supplements when she is not eating well. We are placing a referral to our nutritionist to help her further.  The source of her left lower quadrant pain is likely to be diverticulitis. Her last colonoscopy was positive for moderate diverticulosis in the sigmoid colon. I will treat her with the standard regimen of cipro $Remove'500mg'eaFNYrt$  BID and metronidazole $RemoveBeforeDEI'500mg'oTcxOWeHumLVkUUI$  TID x 10 days. A urine sample was collected prior to our visit because of her complaint of frequency. If the urine culture returns positive, the cipro would likely cover the UTI anyway.   Patrice will return in 2 weeks for the start of cycle 3. She understands and agrees with this plan. She knows the goal of treatment in her case is cure. She has been encouraged to call with a ny issues that might arise before her next visit here.   Laurie Panda, NP   08/09/2014 10:47 AM

## 2014-08-10 LAB — URINE CULTURE

## 2014-08-16 ENCOUNTER — Other Ambulatory Visit: Payer: Self-pay | Admitting: Nurse Practitioner

## 2014-08-16 ENCOUNTER — Other Ambulatory Visit (HOSPITAL_BASED_OUTPATIENT_CLINIC_OR_DEPARTMENT_OTHER): Payer: Medicare Other

## 2014-08-16 ENCOUNTER — Ambulatory Visit: Payer: Medicare Other

## 2014-08-16 ENCOUNTER — Other Ambulatory Visit: Payer: Self-pay | Admitting: *Deleted

## 2014-08-16 ENCOUNTER — Ambulatory Visit (HOSPITAL_COMMUNITY)
Admission: RE | Admit: 2014-08-16 | Discharge: 2014-08-16 | Disposition: A | Discharge status: Home / Self Care | Payer: Medicare Other | Source: Ambulatory Visit | Attending: Oncology | Admitting: Oncology

## 2014-08-16 ENCOUNTER — Ambulatory Visit (HOSPITAL_BASED_OUTPATIENT_CLINIC_OR_DEPARTMENT_OTHER): Payer: Medicare Other | Admitting: Nurse Practitioner

## 2014-08-16 ENCOUNTER — Telehealth: Payer: Self-pay | Admitting: *Deleted

## 2014-08-16 VITALS — BP 150/71 | HR 82 | Temp 97.9°F | Resp 16

## 2014-08-16 DIAGNOSIS — D6481 Anemia due to antineoplastic chemotherapy: Secondary | ICD-10-CM

## 2014-08-16 DIAGNOSIS — C50912 Malignant neoplasm of unspecified site of left female breast: Principal | ICD-10-CM

## 2014-08-16 DIAGNOSIS — T451X5A Adverse effect of antineoplastic and immunosuppressive drugs, initial encounter: Secondary | ICD-10-CM | POA: Diagnosis not present

## 2014-08-16 DIAGNOSIS — C50512 Malignant neoplasm of lower-outer quadrant of left female breast: Secondary | ICD-10-CM

## 2014-08-16 DIAGNOSIS — C50911 Malignant neoplasm of unspecified site of right female breast: Secondary | ICD-10-CM

## 2014-08-16 DIAGNOSIS — R5383 Other fatigue: Secondary | ICD-10-CM

## 2014-08-16 DIAGNOSIS — Z452 Encounter for adjustment and management of vascular access device: Secondary | ICD-10-CM

## 2014-08-16 DIAGNOSIS — Z171 Estrogen receptor negative status [ER-]: Secondary | ICD-10-CM | POA: Diagnosis not present

## 2014-08-16 DIAGNOSIS — Z95828 Presence of other vascular implants and grafts: Secondary | ICD-10-CM

## 2014-08-16 DIAGNOSIS — E860 Dehydration: Secondary | ICD-10-CM

## 2014-08-16 DIAGNOSIS — R197 Diarrhea, unspecified: Secondary | ICD-10-CM | POA: Diagnosis not present

## 2014-08-16 DIAGNOSIS — Z853 Personal history of malignant neoplasm of breast: Secondary | ICD-10-CM

## 2014-08-16 LAB — CBC WITH DIFFERENTIAL/PLATELET
BASO%: 0.5 % (ref 0.0–2.0)
BASOS ABS: 0 10*3/uL (ref 0.0–0.1)
EOS ABS: 0 10*3/uL (ref 0.0–0.5)
EOS%: 0.1 % (ref 0.0–7.0)
HCT: 26 % — ABNORMAL LOW (ref 34.8–46.6)
HGB: 8.5 g/dL — ABNORMAL LOW (ref 11.6–15.9)
LYMPH%: 16.6 % (ref 14.0–49.7)
MCH: 30.4 pg (ref 25.1–34.0)
MCHC: 32.7 g/dL (ref 31.5–36.0)
MCV: 92.9 fL (ref 79.5–101.0)
MONO#: 0.6 10*3/uL (ref 0.1–0.9)
MONO%: 7.2 % (ref 0.0–14.0)
NEUT%: 75.6 % (ref 38.4–76.8)
NEUTROS ABS: 6.3 10*3/uL (ref 1.5–6.5)
Platelets: 137 10*3/uL — ABNORMAL LOW (ref 145–400)
RBC: 2.8 10*6/uL — ABNORMAL LOW (ref 3.70–5.45)
RDW: 19.3 % — AB (ref 11.2–14.5)
WBC: 8.4 10*3/uL (ref 3.9–10.3)
lymph#: 1.4 10*3/uL (ref 0.9–3.3)

## 2014-08-16 LAB — COMPREHENSIVE METABOLIC PANEL (CC13)
ALT: 25 U/L (ref 0–55)
ANION GAP: 6 meq/L (ref 3–11)
AST: 29 U/L (ref 5–34)
Albumin: 3.5 g/dL (ref 3.5–5.0)
Alkaline Phosphatase: 83 U/L (ref 40–150)
BILIRUBIN TOTAL: 0.36 mg/dL (ref 0.20–1.20)
BUN: 11.2 mg/dL (ref 7.0–26.0)
CALCIUM: 8.8 mg/dL (ref 8.4–10.4)
CHLORIDE: 103 meq/L (ref 98–109)
CO2: 26 mEq/L (ref 22–29)
CREATININE: 0.8 mg/dL (ref 0.6–1.1)
EGFR: 70 mL/min/{1.73_m2} — ABNORMAL LOW (ref >90)
GLUCOSE: 113 mg/dL (ref 70–140)
POTASSIUM: 4.2 meq/L (ref 3.5–5.1)
Sodium: 136 mEq/L (ref 136–145)
Total Protein: 6.3 g/dL — ABNORMAL LOW (ref 6.4–8.3)

## 2014-08-16 LAB — ABO/RH: ABO/RH(D): O POS

## 2014-08-16 LAB — HOLD TUBE, BLOOD BANK

## 2014-08-16 MED ORDER — HEPARIN SOD (PORK) LOCK FLUSH 100 UNIT/ML IV SOLN
500.0000 [IU] | Freq: Once | INTRAVENOUS | Status: AC
  Administered 2014-08-16: 500 [IU] via INTRAVENOUS
  Filled 2014-08-16: qty 5

## 2014-08-16 MED ORDER — SODIUM CHLORIDE 0.9 % IJ SOLN
10.0000 mL | INTRAMUSCULAR | Status: DC | PRN
  Administered 2014-08-16: 10 mL via INTRAVENOUS
  Filled 2014-08-16: qty 10

## 2014-08-16 MED ORDER — SODIUM CHLORIDE 0.9 % IV SOLN
INTRAVENOUS | Status: AC
  Administered 2014-08-16: 12:00:00 via INTRAVENOUS

## 2014-08-16 NOTE — Telephone Encounter (Signed)
TC from patient this am stating she has been having loose stools (>5/day) since last week. She now feels very fatigued, lightheaded and at night feels her heart racing... Denies fever, nausea, vomiting. States she is drinking fluids as much as she can. Has not taken imodium as she is afraid she will then get constipated. Currently on Taxotere and Carboplatin. Will schedule her for labs and to see Cyndee Bacon,NP in [facility]. Patient has a port that can be accessed for labs and IVFs.

## 2014-08-16 NOTE — Patient Instructions (Signed)

## 2014-08-16 NOTE — Progress Notes (Signed)
Called [facility] for pt for 2 units of blood. [facility], pt scheduled for 9am tomorrow at [facility]. Informed pt of time and directions.    1400 Pt given stool sample kit for culture. Pt given instructions and informed to call with any problems

## 2014-08-16 NOTE — Telephone Encounter (Signed)
Thanks for seeing her, Jenny Reichmann-- this is likely due to her treatment but have to worry about c diff

## 2014-08-16 NOTE — Telephone Encounter (Signed)
Thank you Val.

## 2014-08-16 NOTE — Patient Instructions (Signed)
Dehydration, Adult Dehydration is when you lose more fluids from the body than you take in. Vital organs like the kidneys, brain, and heart cannot function without a proper amount of fluids and salt. Any loss of fluids from the body can cause dehydration.  CAUSES   Vomiting.  Diarrhea.  Excessive sweating.  Excessive urine output.  Fever. SYMPTOMS  Mild dehydration  Thirst.  Dry lips.  Slightly dry mouth. Moderate dehydration  Very dry mouth.  Sunken eyes.  Skin does not bounce back quickly when lightly pinched and released.  Dark urine and decreased urine production.  Decreased tear production.  Headache. Severe dehydration  Very dry mouth.  Extreme thirst.  Rapid, weak pulse (more than 100 beats per minute at rest).  Cold hands and feet.  Not able to sweat in spite of heat and temperature.  Rapid breathing.  Blue lips.  Confusion and lethargy.  Difficulty being awakened.  Minimal urine production.  No tears. DIAGNOSIS  Your caregiver will diagnose dehydration based on your symptoms and your exam. Blood and urine tests will help confirm the diagnosis. The diagnostic evaluation should also identify the cause of dehydration. TREATMENT  Treatment of mild or moderate dehydration can often be done at home by increasing the amount of fluids that you drink. It is best to drink small amounts of fluid more often. Drinking too much at one time can make vomiting worse. Refer to the home care instructions below. Severe dehydration needs to be treated at the hospital where you will probably be given intravenous (IV) fluids that contain water and electrolytes. HOME CARE INSTRUCTIONS   Ask your caregiver about specific rehydration instructions.  Drink enough fluids to keep your urine clear or pale yellow.  Drink small amounts frequently if you have nausea and vomiting.  Eat as you normally do.  Avoid:  Foods or drinks high in sugar.  Carbonated  drinks.  Juice.  Extremely hot or cold fluids.  Drinks with caffeine.  Fatty, greasy foods.  Alcohol.  Tobacco.  Overeating.  Gelatin desserts.  Wash your hands well to avoid spreading bacteria and viruses.  Only take over-the-counter or prescription medicines for pain, discomfort, or fever as directed by your caregiver.  Ask your caregiver if you should continue all prescribed and over-the-counter medicines.  Keep all follow-up appointments with your caregiver. SEEK MEDICAL CARE IF:  You have abdominal pain and it increases or stays in one area (localizes).  You have a rash, stiff neck, or severe headache.  You are irritable, sleepy, or difficult to awaken.  You are weak, dizzy, or extremely thirsty. SEEK IMMEDIATE MEDICAL CARE IF:   You are unable to keep fluids down or you get worse despite treatment.  You have frequent episodes of vomiting or diarrhea.  You have blood or green matter (bile) in your vomit.  You have blood in your stool or your stool looks black and tarry.  You have not urinated in 6 to 8 hours, or you have only urinated a small amount of very dark urine.  You have a fever.  You faint. MAKE SURE YOU:   Understand these instructions.  Will watch your condition.  Will get help right away if you are not doing well or get worse. Document Released: 02/25/2005 Document Revised: 05/20/2011 Document Reviewed: 10/15/2010 ExitCare Patient Information 2015 [employer]. This information is not intended to replace advice given to you by your health care provider. Make sure you discuss any questions you have with your health care   provider.  

## 2014-08-17 ENCOUNTER — Encounter: Payer: Self-pay | Admitting: Nurse Practitioner

## 2014-08-17 ENCOUNTER — Ambulatory Visit: Payer: Medicare Other

## 2014-08-17 ENCOUNTER — Ambulatory Visit (HOSPITAL_COMMUNITY)
Admission: RE | Admit: 2014-08-17 | Discharge: 2014-08-17 | Disposition: A | Discharge status: Home / Self Care | Payer: Medicare Other | Source: Ambulatory Visit | Attending: Oncology | Admitting: Oncology

## 2014-08-17 ENCOUNTER — Telehealth: Payer: Self-pay

## 2014-08-17 ENCOUNTER — Other Ambulatory Visit (HOSPITAL_COMMUNITY)
Admission: AD | Admit: 2014-08-17 | Discharge: 2014-08-17 | Disposition: A | Discharge status: Home / Self Care | Payer: Medicare Other | Source: Ambulatory Visit | Attending: Oncology | Admitting: Oncology

## 2014-08-17 VITALS — BP 133/64 | HR 82 | Temp 97.6°F | Resp 18

## 2014-08-17 DIAGNOSIS — T451X5A Adverse effect of antineoplastic and immunosuppressive drugs, initial encounter: Secondary | ICD-10-CM | POA: Diagnosis not present

## 2014-08-17 DIAGNOSIS — D6481 Anemia due to antineoplastic chemotherapy: Secondary | ICD-10-CM | POA: Diagnosis not present

## 2014-08-17 DIAGNOSIS — E860 Dehydration: Secondary | ICD-10-CM | POA: Diagnosis not present

## 2014-08-17 DIAGNOSIS — R197 Diarrhea, unspecified: Secondary | ICD-10-CM | POA: Insufficient documentation

## 2014-08-17 LAB — PREPARE RBC (CROSSMATCH)

## 2014-08-17 LAB — CLOSTRIDIUM DIFFICILE BY PCR: CDIFFPCR: NEGATIVE

## 2014-08-17 MED ORDER — SODIUM CHLORIDE 0.9 % IJ SOLN: 3.0000 mL | INTRAMUSCULAR | Status: DC | PRN

## 2014-08-17 MED ORDER — DIPHENHYDRAMINE HCL 25 MG PO CAPS
25.0000 mg | ORAL_CAPSULE | Freq: Once | ORAL | Status: AC
  Administered 2014-08-17: 25 mg via ORAL
  Filled 2014-08-17: qty 1

## 2014-08-17 MED ORDER — ACETAMINOPHEN 325 MG PO TABS
650.0000 mg | ORAL_TABLET | Freq: Once | ORAL | Status: AC
  Administered 2014-08-17: 650 mg via ORAL
  Filled 2014-08-17: qty 2

## 2014-08-17 MED ORDER — SODIUM CHLORIDE 0.9 % IV SOLN: 250.0000 mL | Freq: Once | INTRAVENOUS | Status: DC

## 2014-08-17 MED ORDER — HEPARIN SOD (PORK) LOCK FLUSH 100 UNIT/ML IV SOLN: 250.0000 [IU] | INTRAVENOUS | Status: DC | PRN

## 2014-08-17 MED ORDER — HEPARIN SOD (PORK) LOCK FLUSH 100 UNIT/ML IV SOLN
500.0000 [IU] | Freq: Every day | INTRAVENOUS | Status: AC | PRN
  Administered 2014-08-17: 500 [IU]
  Filled 2014-08-17: qty 5

## 2014-08-17 MED ORDER — SODIUM CHLORIDE 0.9 % IJ SOLN
10.0000 mL | INTRAMUSCULAR | Status: AC | PRN
  Administered 2014-08-17: 10 mL

## 2014-08-17 NOTE — Telephone Encounter (Signed)
Called to follow up with pt fter [facility] visit yesterday 08/16/14. Pt states she is still having diarrhea but feels a little better this morning. Pt states she will bring her stool sample in this morning on her way to sick[facility] cell for blood. Informed pt to call with any other questions or concerns. Pt verbalized understanding. Called Kim in lab to inform her pt to bring sample by.

## 2014-08-17 NOTE — Assessment & Plan Note (Signed)
(  1) status post left modified radical mastectomy November 1998 for a pT2 pN0, stage IIA invasive ductal carcinoma, grade 2, estrogen and progesterone receptor positive, HER-2 not amplified (a) adjuvant chemotherapy consisted of doxorubicin and cyclophosphamide 4 (b) adjuvant anti-estrogens consisted of tamoxifen, then letrozole, for a total of 5 years  (2) status post left breast lower outer quadrant biopsy 04/07/2014 for a clinical T1c N0,stage IA invasive ductal carcinoma, with squamous features, estrogen and progesterone receptor negative, HER-2 not amplified, with an MIB-1 of 10%   (3) left lumpectomy and sentinel lymph node biopsy 05/05/2014 showed aT1c pN0, stage IA invasive ductal carcinoma, grade 1, triple negative, with close but negative margins.   (4) Mammaprint classifies the tumor as basal like, high risk, and predicts a 30% chance of recurrence within 10 years with local treatment only.   (5) start of treatment delayed because of bilateral pneumothoraces after port placement. adjuvant chemotherapy starting 07/11/2014 to consist of carboplatin and docetaxel given every 21 days 4 with Neulasta support.  (6) genetics testing March 2016 showed no deleterious mutation in the [employer]vaNext panel [[employer]] including sequencing and rearrangement analysis for the following 24 genes:ATM, BARD1, BRCA1, BRCA2, BRIP1, CDH1, CHEK2, EPCAM, MLH1, MRE11A, MSH2, MSH6, MUTYH, NBN, NF1, PALB2, PMS2, PTEN, RAD50, RAD51C, RAD51D, SMARCA4, STK11, and TP53.  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   Patient received her last cycle of carboplatin/docetaxel chemotherapy on 08/01/2014.  She is scheduled to return for her next cycle of the same chemotherapy on 08/22/2014.

## 2014-08-17 NOTE — Assessment & Plan Note (Signed)
Patient presented to the [facility] [place] last week with some left sided abdominal discomfort.  Patient had known diverticulosis; and so was treated for questionable mild diverticulitis with both Cipro and Flagyl anabiotic's.  Since initiating these anabiotic-patient has developed some chronic diarrhea.  She feels increased fatigue and dehydration today.  She denies any worsening nausea or vomiting.  She reports no further abdominal discomfort.  Patient denies any recent fevers or chills.  Patient will receive 1 L normal saline IV fluid rehydration today.  Chills.  Also encouraged to push fluids at home.  Will also obtain a stool for C. difficile.  Also, advised patient to discontinue both the Cipro and Flagyl antibiotics today; since they may be contributing to patient's diarrhea.

## 2014-08-17 NOTE — Progress Notes (Signed)
SYMPTOM MANAGEMENT CLINIC   HPI: Laura Li 74 y.o. female diagnosed with bilateral breast cancer.  Currently undergoing carbo/docetaxel.  Chemotherapy therapy regimen.  Patient presented to the cancer Center last week with some left sided abdominal discomfort.  Patient had known diverticulosis; and so was treated for questionable mild diverticulitis with both Cipro and Flagyl anabiotic's.  Since initiating these anabiotic-patient has developed some chronic diarrhea.  She feels increased fatigue and dehydration today.  She denies any worsening nausea or vomiting.  She reports no further abdominal discomfort.  Patient denies any recent fevers or chills.   HPI  ROS  Past Medical History  Diagnosis Date  . OA (osteoarthritis)   . History of DVT (deep vein thrombosis)   . Hyperlipidemia   . Hypertension   . Hypothyroidism   . HX: breast cancer     melanoma  . Melanoma   . Eczema   . Arthritis   . Esophageal stricture   . GERD (gastroesophageal reflux disease)   . PONV (postoperative nausea and vomiting)   . Wears glasses   . Depression     states no depression  . Chronic kidney disease     some renal impairment from meds  . Headache   . Anemia     from medicaions  . Heart murmur     states it is benign told 30-40 years ago  . Pneumothorax on right 06/15/2014    Past Surgical History  Procedure Laterality Date  . Appendectomy  1967  . Melanoma excision  2004    right side of face  . 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 Total abdominal hysterectomy  1992    including ovaries  . Tonsillectomy and adenoidectomy  1949  . Lipoma excision  1978    right breast  . Mastectomy  1998    right-nodes out  . Breast lumpectomy      left breast-benign  . Breast surgery  1977    removal of calcified milk gland right breast  . Colonoscopy    . Radioactive seed guided mastectomy with axillary sentinel lymph node biopsy Left 05/05/2014    Procedure: RADIOACTIVE  SEED GUIDED LEFT LUMPECTOMY WITH AXILLARY SENTINEL LYMPH NODE BIOPSY;  Surgeon: Emelia Loron, MD;  Location: [facility];  Service: General;  Laterality: Left;  . Chest tube insertion  06/15/2014  . Portacath placement Left 06/15/2014    Procedure: INSERTION PORT-A-CATH;  Surgeon: Emelia Loron, MD;  Location: [facility] OR;  Service: General;  Laterality: Left;  . Chest tube insertion Bilateral 06/15/2014    Procedure: CHEST TUBE INSERTION;  Surgeon: Emelia Loron, MD;  Location: [facility] OR;  Service: General;  Laterality: Bilateral;    has Hypothyroidism; Dyslipidemia; Essential hypertension; Allergic rhinitis; Osteopenia; DVT, HX OF; Chronic kidney disease, stage III (moderate); Anemia in neoplastic disease; Vasculitis; GERD (gastroesophageal reflux disease); Hot flashes; Osteoarthritis; Melanoma of skin; Breast cancer, left breast; Breast cancer, right breast; Bilateral breast cancer; Family history of breast cancer; Breast cancer; Pneumothorax; Chest tube in place; Depression with anxiety; Diverticulitis; Dehydration; Antineoplastic chemotherapy induced anemia; and Diarrhea on her problem list.    is allergic to dyazide; niacin; and penicillins.    Medication List       This list is accurate as of: 08/16/14 11:59 PM.  Always use your most recent med list.               acetaminophen 500 MG tablet  Commonly known as:  TYLENOL  Take 500 mg by mouth 2 (two) times daily as needed for mild pain.     atorvastatin 20 MG tablet  Commonly known as:  LIPITOR  Take 1 tablet (20 mg total) by mouth daily.     CALTRATE 600 PLUS-VIT D PO  Take 1 tablet by mouth 2 (two) times daily.     ciprofloxacin 500 MG tablet  Commonly known as:  CIPRO  Take 1 tablet (500 mg total) by mouth 2 (two) times daily.     CULTURELLE DIGESTIVE HEALTH Chew  Chew 1 capsule by mouth daily.     cycloSPORINE 0.05 % ophthalmic emulsion  Commonly known as:  RESTASIS  Place 1 drop into both eyes 2 (two)  times daily.     dexamethasone 4 MG tablet  Commonly known as:  DECADRON  Take 2 tablets (8 mg total) by mouth 2 (two) times daily. Start the day before Taxotere. Then again the day after chemo for 3 days.     docusate sodium 100 MG capsule  Commonly known as:  COLACE  Take 1 capsule (100 mg total) by mouth 2 (two) times daily.     esomeprazole 40 MG capsule  Commonly known as:  NEXIUM  TAKE 1 CAPSULE DAILY     fexofenadine 180 MG tablet  Commonly known as:  ALLEGRA  Take 180 mg by mouth daily as needed for allergies.     ipratropium 0.03 % nasal spray  Commonly known as:  ATROVENT  Place 2 sprays into the nose every 12 (twelve) hours.     levothyroxine 75 MCG tablet  Commonly known as:  SYNTHROID, LEVOTHROID  Take 1 tablet (75 mcg total) by mouth daily.     lidocaine-prilocaine cream  Commonly known as:  EMLA  Apply to affected area once     LORazepam 0.5 MG tablet  Commonly known as:  ATIVAN  1/2 to 1 tab po q 8 hours prn     metroNIDAZOLE 500 MG tablet  Commonly known as:  FLAGYL  Take 1 tablet (500 mg total) by mouth 3 (three) times daily.     nadolol 20 MG tablet  Commonly known as:  CORGARD  Take 0.5 tablets (10 mg total) by mouth daily.     prochlorperazine 10 MG tablet  Commonly known as:  COMPAZINE  Take 1 tablet (10 mg total) by mouth every 6 (six) hours as needed (Nausea or vomiting).     REFRESH LIQUIGEL OP  Apply 1 drop to eye at bedtime as needed (dry eyes).     SYSTANE 0.4-0.3 % Soln  Generic drug:  Polyethyl Glycol-Propyl Glycol  Apply 1 drop to eye daily as needed (for dryness/itching).     traMADol 50 MG tablet  Commonly known as:  ULTRAM  TAKE 1 TABLET BY MOUTH EVERY 6 HOURS AS NEEDED FOR MODERATE/SEVERE PAIN     venlafaxine XR 150 MG 24 hr capsule  Commonly known as:  EFFEXOR-XR  Take 1 capsule (150 mg total) by mouth daily.         PHYSICAL EXAMINATION  Oncology Vitals 08/17/2014 08/17/2014 08/17/2014 08/17/2014 08/17/2014 08/16/2014  08/09/2014  Height - - - - - - 168 cm  Weight - - - - - - 62.778 kg  Weight (lbs) - - - - - - 138 lbs 6 oz  BMI (kg/m2) - - - - - - 22.34 kg/m2  Temp 97.6 97.9 97.9 97.8 98.4 97.9 97.5  Pulse 82 70 72 80 86 82 88  Resp '18 18 18 18 18 16 18  '$ SpO2 100 100 100 100 99 100 -  BSA (m2) - - - - - - 1.71 m2   BP Readings from Last 3 Encounters:  08/17/14 133/64  08/16/14 150/71  08/09/14 135/63    Physical Exam  Constitutional: She is oriented to person, place, and time and well-developed, well-nourished, and in no distress.  HENT:  Head: Normocephalic and atraumatic.  Mouth/Throat: Oropharynx is clear and moist.  Eyes: Conjunctivae and EOM are normal. Pupils are equal, round, and reactive to light. Right eye exhibits no discharge. Left eye exhibits no discharge. No scleral icterus.  Neck: Normal range of motion. Neck supple. No JVD present. No tracheal deviation present. No thyromegaly present.  Cardiovascular: Normal rate, regular rhythm, normal heart sounds and intact distal pulses.   Pulmonary/Chest: Effort normal and breath sounds normal. No respiratory distress. She has no wheezes. She has no rales. She exhibits no tenderness.  Abdominal: Soft. Bowel sounds are normal. She exhibits no distension and no mass. There is no tenderness. There is no rebound and no guarding.  Musculoskeletal: Normal range of motion. She exhibits no edema or tenderness.  Lymphadenopathy:    She has no cervical adenopathy.  Neurological: She is alert and oriented to person, place, and time.  Skin: Skin is warm and dry. No rash noted. No erythema. No pallor.  Psychiatric: Affect normal.  Nursing note and vitals reviewed.   LABORATORY DATA:. Hospital Outpatient Visit on 08/16/2014  Component Date Value Ref Range Status  . ABO/RH(D) 08/16/2014 O POS   Final  . Antibody Screen 08/16/2014 NEG   Final  . Sample Expiration 08/16/2014 08/19/2014   Final  . Unit Number 08/16/2014 [id number]   Final  . Blood  Component Type 08/16/2014 RED CELLS,LR   Final  . Unit division 08/16/2014 00   Final  . Status of Unit 08/16/2014 ISSUED   Final  . Transfusion Status 08/16/2014 OK TO TRANSFUSE   Final  . Crossmatch Result 08/16/2014 Compatible   Final  . Unit Number 08/16/2014 [id number]   Final  . Blood Component Type 08/16/2014 RBC LR PHER1   Final  . Unit division 08/16/2014 00   Final  . Status of Unit 08/16/2014 ISSUED   Final  . Transfusion Status 08/16/2014 OK TO TRANSFUSE   Final  . Crossmatch Result 08/16/2014 Compatible   Final  . ABO/RH(D) 08/16/2014 O POS   Final  . Order Confirmation 08/17/2014 ORDER PROCESSED BY BLOOD BANK   Final  Appointment on 08/16/2014  Component Date Value Ref Range Status  . Hold Tube, Blood Bank 08/16/2014 Type and Crossmatch Added   Final  Appointment on 08/16/2014  Component Date Value Ref Range Status  . WBC 08/16/2014 8.4  3.9 - 10.3 10e3/uL Final  . NEUT# 08/16/2014 6.3  1.5 - 6.5 10e3/uL Final  . HGB 08/16/2014 8.5* 11.6 - 15.9 g/dL Final  . HCT 08/16/2014 26.0* 34.8 - 46.6 % Final  . Platelets 08/16/2014 137* 145 - 400 10e3/uL Final  . MCV 08/16/2014 92.9  79.5 - 101.0 fL Final  . MCH 08/16/2014 30.4  25.1 - 34.0 pg Final  . MCHC 08/16/2014 32.7  31.5 - 36.0 g/dL Final  . RBC 08/16/2014 2.80* 3.70 - 5.45 10e6/uL Final  . RDW 08/16/2014 19.3* 11.2 - 14.5 % Final  . lymph# 08/16/2014 1.4  0.9 - 3.3 10e3/uL Final  . MONO# 08/16/2014 0.6  0.1 - 0.9 10e3/uL Final  . Eosinophils Absolute 08/16/2014  0.0  0.0 - 0.5 10e3/uL Final  . Basophils Absolute 08/16/2014 0.0  0.0 - 0.1 10e3/uL Final  . NEUT% 08/16/2014 75.6  38.4 - 76.8 % Final  . LYMPH% 08/16/2014 16.6  14.0 - 49.7 % Final  . MONO% 08/16/2014 7.2  0.0 - 14.0 % Final  . EOS% 08/16/2014 0.1  0.0 - 7.0 % Final  . BASO% 08/16/2014 0.5  0.0 - 2.0 % Final  . Sodium 08/16/2014 136  136 - 145 mEq/L Final  . Potassium 08/16/2014 4.2  3.5 - 5.1 mEq/L Final  . Chloride 08/16/2014 103  98 - 109 mEq/L  Final  . CO2 08/16/2014 26  22 - 29 mEq/L Final  . Glucose 08/16/2014 113  70 - 140 mg/dl Final  . BUN 08/16/2014 11.2  7.0 - 26.0 mg/dL Final  . Creatinine 08/16/2014 0.8  0.6 - 1.1 mg/dL Final  . Total Bilirubin 08/16/2014 0.36  0.20 - 1.20 mg/dL Final  . Alkaline Phosphatase 08/16/2014 83  40 - 150 U/L Final  . AST 08/16/2014 29  5 - 34 U/L Final  . ALT 08/16/2014 25  0 - 55 U/L Final  . Total Protein 08/16/2014 6.3* 6.4 - 8.3 g/dL Final  . Albumin 08/16/2014 3.5  3.5 - 5.0 g/dL Final  . Calcium 08/16/2014 8.8  8.4 - 10.4 mg/dL Final  . Anion Gap 08/16/2014 6  3 - 11 mEq/L Final  . EGFR 08/16/2014 70* >90 ml/min/1.73 m2 Final   eGFR is calculated using the CKD-EPI Creatinine Equation (2009)     RADIOGRAPHIC STUDIES: No results found.  ASSESSMENT/PLAN:    Bilateral breast cancer (1) status post left modified radical mastectomy November 1998 for a pT2 pN0, stage IIA invasive ductal carcinoma, grade 2, estrogen and progesterone receptor positive, HER-2 not amplified (a) adjuvant chemotherapy consisted of doxorubicin and cyclophosphamide 4 (b) adjuvant anti-estrogens consisted of tamoxifen, then letrozole, for a total of 5 years  (2) status post left breast lower outer quadrant biopsy 04/07/2014 for a clinical T1c N0,stage IA invasive ductal carcinoma, with squamous features, estrogen and progesterone receptor negative, HER-2 not amplified, with an MIB-1 of 10%   (3) left lumpectomy and sentinel lymph node biopsy 05/05/2014 showed aT1c pN0, stage IA invasive ductal carcinoma, grade 1, triple negative, with close but negative margins.   (4) Mammaprint classifies the tumor as basal like, high risk, and predicts a 30% chance of recurrence within 10 years with local treatment only.   (5) start of treatment delayed because of bilateral pneumothoraces after port placement. adjuvant chemotherapy starting 07/11/2014 to consist of carboplatin and docetaxel given every  21 days 4 with Neulasta support.  (6) genetics testing March 2016 showed no deleterious mutation in the [employer] panel [[employer]] including sequencing and rearrangement analysis for the following 24 genes:ATM, BARD1, BRCA1, BRCA2, BRIP1, CDH1, CHEK2, EPCAM, MLH1, MRE11A, MSH2, MSH6, MUTYH, NBN, NF1, PALB2, PMS2, PTEN, RAD50, RAD51C, RAD51D, SMARCA4, STK11, and TP53.  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   Patient received her last cycle of carboplatin/docetaxel chemotherapy on 08/01/2014.  She is scheduled to return for her next cycle of the same chemotherapy on 08/22/2014.    Dehydration Patient presented to the [facility] [place] last week with some left sided abdominal discomfort.  Patient had known diverticulosis; and so was treated for questionable mild diverticulitis with both Cipro and Flagyl anabiotic's.  Since initiating these anabiotic-patient has developed some chronic diarrhea.  She feels increased  fatigue and dehydration today.  She denies any worsening nausea or vomiting.  She reports no further abdominal discomfort.  Patient denies any recent fevers or chills.  Patient will receive 1 L normal saline IV fluid rehydration today.  Chills.  Also encouraged to push fluids at home.  Will also obtain a stool for C. difficile.  Also, advised patient to discontinue both the Cipro and Flagyl antibiotics today; since they may be contributing to patient's diarrhea.  Antineoplastic chemotherapy induced anemia Chemotherapy-induced anemia with hemoglobin down to 8.5.  Patient is complaining of increased fatigue; but denies any shortness of breath with exertion.  Patient will return first thing in the morning to the sickle cell clinic to receive 2 units of blood transfusion.  Diarrhea Patient presented to the [place] last week with some left sided abdominal discomfort.  Patient had known  diverticulosis; and so was treated for questionable mild diverticulitis with both Cipro and Flagyl anabiotic's.  Since initiating these anabiotic-patient has developed some chronic diarrhea.  She feels increased fatigue and dehydration today.  She denies any worsening nausea or vomiting.  She reports no further abdominal discomfort.  Patient denies any recent fevers or chills.  Patient will receive 1 L normal saline IV fluid rehydration today.  Chills.  Also encouraged to push fluids at home.  Will also obtain a stool for C. difficile.  Also, advised patient to discontinue both the Cipro and Flagyl antibiotics today; since they may be contributing to patient's diarrhea.   Patient stated understanding of all instructions; and was in agreement with this plan of care. The patient knows to call the clinic with any problems, questions or concerns.   Review/collaboration with Dr. Jana Hakim regarding all aspects of patient's visit today.   Total time spent with patient was 40 minutes;  with greater than 75 percent of that time spent in face to face counseling regarding patient's symptoms,  and coordination of care and follow up.  Disclaimer: This note was dictated with voice recognition software. Similar sounding words can inadvertently be transcribed and may not be corrected upon review.   Drue Second, NP 08/17/2014

## 2014-08-17 NOTE — Procedures (Signed)
Diagnosis: Anemia due to antineoplastic chemotherapy E 933.1  MD Magrinat Procedure Note: Port accessed, blood return noted, Pre blood medications administered, 2 unit PRB's infused, port de accessed per protocol Condition during procedure: Tolerated well Condition after procedure: Alert, oriented and ambulatory, accompanied by daughter.

## 2014-08-17 NOTE — Assessment & Plan Note (Signed)
Patient presented to the [place] last week with some left sided abdominal discomfort.  Patient had known diverticulosis; and so was treated for questionable mild diverticulitis with both Cipro and Flagyl anabiotic's.  Since initiating these anabiotic-patient has developed some chronic diarrhea.  She feels increased fatigue and dehydration today.  She denies any worsening nausea or vomiting.  She reports no further abdominal discomfort.  Patient denies any recent fevers or chills.  Patient will receive 1 L normal saline IV fluid rehydration today.  Chills.  Also encouraged to push fluids at home.  Will also obtain a stool for C. difficile.  Also, advised patient to discontinue both the Cipro and Flagyl antibiotics today; since they may be contributing to patient's diarrhea.

## 2014-08-17 NOTE — Assessment & Plan Note (Signed)
Chemotherapy-induced anemia with hemoglobin down to 8.5.  Patient is complaining of increased fatigue; but denies any shortness of breath with exertion.  Patient will return first thing in the morning to the sickle cell clinic to receive 2 units of blood transfusion.

## 2014-08-18 ENCOUNTER — Telehealth: Payer: Self-pay

## 2014-08-18 LAB — TYPE AND SCREEN
ABO/RH(D): O POS
Antibody Screen: NEGATIVE
Unit division: 0
Unit division: 0

## 2014-08-18 NOTE — Telephone Encounter (Signed)
Called to inform pt of negative C-Diff results. Pt appreciative of call. Pt also states she feels much better after receiving blood yesterday. Informed pt to call with any other concerns. Pt verbalized understanding

## 2014-08-22 ENCOUNTER — Other Ambulatory Visit (HOSPITAL_BASED_OUTPATIENT_CLINIC_OR_DEPARTMENT_OTHER): Payer: Medicare Other

## 2014-08-22 ENCOUNTER — Ambulatory Visit: Payer: Medicare Other

## 2014-08-22 ENCOUNTER — Encounter: Payer: Self-pay | Admitting: *Deleted

## 2014-08-22 ENCOUNTER — Ambulatory Visit (HOSPITAL_BASED_OUTPATIENT_CLINIC_OR_DEPARTMENT_OTHER): Payer: Medicare Other

## 2014-08-22 ENCOUNTER — Ambulatory Visit: Payer: Medicare Other | Admitting: Nutrition

## 2014-08-22 ENCOUNTER — Encounter: Payer: Self-pay | Admitting: Nurse Practitioner

## 2014-08-22 ENCOUNTER — Other Ambulatory Visit: Payer: Medicare Other

## 2014-08-22 ENCOUNTER — Ambulatory Visit (HOSPITAL_BASED_OUTPATIENT_CLINIC_OR_DEPARTMENT_OTHER): Payer: Medicare Other | Admitting: Nurse Practitioner

## 2014-08-22 VITALS — BP 136/65 | HR 88 | Temp 97.7°F | Resp 18 | Ht 66.0 in | Wt 143.6 lb

## 2014-08-22 DIAGNOSIS — Z5111 Encounter for antineoplastic chemotherapy: Secondary | ICD-10-CM | POA: Diagnosis not present

## 2014-08-22 DIAGNOSIS — Z853 Personal history of malignant neoplasm of breast: Secondary | ICD-10-CM

## 2014-08-22 DIAGNOSIS — C50512 Malignant neoplasm of lower-outer quadrant of left female breast: Secondary | ICD-10-CM | POA: Diagnosis not present

## 2014-08-22 DIAGNOSIS — Z171 Estrogen receptor negative status [ER-]: Secondary | ICD-10-CM

## 2014-08-22 DIAGNOSIS — C50911 Malignant neoplasm of unspecified site of right female breast: Secondary | ICD-10-CM

## 2014-08-22 DIAGNOSIS — C50912 Malignant neoplasm of unspecified site of left female breast: Secondary | ICD-10-CM

## 2014-08-22 LAB — CBC WITH DIFFERENTIAL/PLATELET
BASO%: 0.1 % (ref 0.0–2.0)
Basophils Absolute: 0 10*3/uL (ref 0.0–0.1)
EOS ABS: 0 10*3/uL (ref 0.0–0.5)
EOS%: 0 % (ref 0.0–7.0)
HCT: 37.7 % (ref 34.8–46.6)
HGB: 12.6 g/dL (ref 11.6–15.9)
LYMPH%: 5.2 % — AB (ref 14.0–49.7)
MCH: 31.6 pg (ref 25.1–34.0)
MCHC: 33.4 g/dL (ref 31.5–36.0)
MCV: 94.5 fL (ref 79.5–101.0)
MONO#: 1 10*3/uL — ABNORMAL HIGH (ref 0.1–0.9)
MONO%: 5.1 % (ref 0.0–14.0)
NEUT#: 17.1 10*3/uL — ABNORMAL HIGH (ref 1.5–6.5)
NEUT%: 89.6 % — AB (ref 38.4–76.8)
PLATELETS: 255 10*3/uL (ref 145–400)
RBC: 3.99 10*6/uL (ref 3.70–5.45)
RDW: 18 % — ABNORMAL HIGH (ref 11.2–14.5)
WBC: 19.1 10*3/uL — ABNORMAL HIGH (ref 3.9–10.3)
lymph#: 1 10*3/uL (ref 0.9–3.3)

## 2014-08-22 LAB — COMPREHENSIVE METABOLIC PANEL (CC13)
ALT: 19 U/L (ref 0–55)
ANION GAP: 15 meq/L — AB (ref 3–11)
AST: 16 U/L (ref 5–34)
Albumin: 3.9 g/dL (ref 3.5–5.0)
Alkaline Phosphatase: 78 U/L (ref 40–150)
BUN: 18.1 mg/dL (ref 7.0–26.0)
CHLORIDE: 97 meq/L — AB (ref 98–109)
CO2: 22 meq/L (ref 22–29)
CREATININE: 1.1 mg/dL (ref 0.6–1.1)
Calcium: 9.7 mg/dL (ref 8.4–10.4)
EGFR: 50 mL/min/{1.73_m2} — ABNORMAL LOW (ref >90)
Glucose: 142 mg/dl — ABNORMAL HIGH (ref 70–140)
Potassium: 4.3 mEq/L (ref 3.5–5.1)
Sodium: 134 mEq/L — ABNORMAL LOW (ref 136–145)
Total Bilirubin: 0.45 mg/dL (ref 0.20–1.20)
Total Protein: 7.2 g/dL (ref 6.4–8.3)

## 2014-08-22 MED ORDER — SODIUM CHLORIDE 0.9 % IV SOLN
250.0000 mL | Freq: Once | INTRAVENOUS | Status: AC
  Administered 2014-08-22: 250 mL via INTRAVENOUS

## 2014-08-22 MED ORDER — HEPARIN SOD (PORK) LOCK FLUSH 100 UNIT/ML IV SOLN
500.0000 [IU] | Freq: Once | INTRAVENOUS | Status: AC | PRN
  Administered 2014-08-22: 500 [IU]
  Filled 2014-08-22: qty 5

## 2014-08-22 MED ORDER — SODIUM CHLORIDE 0.9 % IV SOLN
Freq: Once | INTRAVENOUS | Status: AC
  Administered 2014-08-22: 12:00:00 via INTRAVENOUS
  Filled 2014-08-22: qty 8

## 2014-08-22 MED ORDER — DOCETAXEL CHEMO INJECTION 160 MG/16ML
75.0000 mg/m2 | Freq: Once | INTRAVENOUS | Status: AC
  Administered 2014-08-22: 130 mg via INTRAVENOUS
  Filled 2014-08-22: qty 13

## 2014-08-22 MED ORDER — CARBOPLATIN CHEMO INJECTION 450 MG/45ML
356.5000 mg | Freq: Once | INTRAVENOUS | Status: AC
  Administered 2014-08-22: 360 mg via INTRAVENOUS
  Filled 2014-08-22: qty 36

## 2014-08-22 MED ORDER — SODIUM CHLORIDE 0.9 % IJ SOLN
10.0000 mL | INTRAMUSCULAR | Status: DC | PRN
  Administered 2014-08-22: 10 mL
  Filled 2014-08-22: qty 10

## 2014-08-22 NOTE — Progress Notes (Signed)
Pt states that she returns the day after treatment for the Neulasta injection.

## 2014-08-22 NOTE — Progress Notes (Signed)
Oncology Nurse Navigator Documentation  Oncology Nurse Navigator Flowsheets 08/22/2014  Navigator Encounter Type Other  Patient Visit Type Medonc  Treatment Phase Treatment  Time Spent with Patient 15   Met with pt during chemo treatment. Relate doing well denies need. Encourage pt to call with questions or concerns.

## 2014-08-22 NOTE — Progress Notes (Signed)
74 year old female diagnosed with triple negative breast cancer.  She is a patient of Dr. Jana Hakim.  Past medical history includes osteoarthritis,DVT, hyperlipidemia, hypothyroidism, esophageal stricture, GERD, postop nausea vomiting, depression, chronic kidney disease, and anemia.  Medications include Lipitor, vitamin D, Decadron, Colace, Nexium, Synthroid, Ativan, Flagyl, Compazine, and Effexor XR.  Labs include sodium 134 and glucose 142 June 13.  Height: 66 inches. Weight: 143.6 pounds June 13.  Weight was documented as 138.4 pounds May 31. Usual body weight: 148 pounds January 2016. BMI: 23.19.  Patient reports she has a poor appetite.   She describes metallic taste with most foods, including water. She alternates between constipation and diarrhea. She drinks [employer] daily.  Nutrition diagnosis: Unintended weight loss related to poor appetite and altered taste as evidenced by 10 pound weight loss in 5 months.  Intervention:  Educated patient to try to consume small frequent meals and snacks with adequate calories and protein to preserve lean body mass. Educated patient on strategies for improving metallic taste. Also educated patient on strategies for dealing with constipation and diarrhea. Fact Sheets were provided and questions were answered as well as teach back method used. Contact information was given.  Monitoring, evaluation, goals: Patient will tolerate adequate calories and protein to promote maintenance of lean body mass.  Next visit: Tuesday, July 5, during infusion.  **Disclaimer: This note was dictated with voice recognition software. Similar sounding words can inadvertently be transcribed and this note may contain transcription errors which may not have been corrected upon publication of note.**

## 2014-08-22 NOTE — Patient Instructions (Signed)
[place] Discharge Instructions for Patients Receiving Chemotherapy  Today you received the following chemotherapy agents: Taxotere, Carboplatin.  To help prevent nausea and vomiting after your treatment, we encourage you to take your nausea medication as directed.    If you develop nausea and vomiting that is not controlled by your nausea medication, call the clinic.   BELOW ARE SYMPTOMS THAT SHOULD BE REPORTED IMMEDIATELY:  *FEVER GREATER THAN 100.5 F  *CHILLS WITH OR WITHOUT FEVER  NAUSEA AND VOMITING THAT IS NOT CONTROLLED WITH YOUR NAUSEA MEDICATION  *UNUSUAL SHORTNESS OF BREATH  *UNUSUAL BRUISING OR BLEEDING  TENDERNESS IN MOUTH AND THROAT WITH OR WITHOUT PRESENCE OF ULCERS  *URINARY PROBLEMS  *BOWEL PROBLEMS  UNUSUAL RASH Items with * indicate a potential emergency and should be followed up as soon as possible.  Feel free to call the clinic you have any questions or concerns. The clinic phone number is [phone].  Please show the [place] at check-in to the Emergency Department and triage nurse.

## 2014-08-22 NOTE — Progress Notes (Signed)
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 Non-of the 25 lymph nodes removed from the right axilla were involved. The tumor was estrogen and progesterone receptor positive, HER-2 not amplified. She was treated adjuvantly with doxorubicin and cyclophosphamide 4, then received 5 years of antiestrogen therapy (initially tamoxifen, later letrozole). There has been no evidence of disease recurrence on the right.  More recently, Sindia had routine left screening [facility]. This showed a suspicious area of architectural distortion in the inferior portion of the left breast. On 15/17/6160 Jeannifer underwent left diagnostic mammography with ultrasonography. Spot compression views confirmed the presence 7 area of distortion in the lower central left breast, which was not palpable by physical exam (there was a prior biopsy scar in the upper outer quadrant of the left breast). Ultrasound confirmed a hypoechoic spiculated mass at the 6:00 location in the left breast, measuring maximally 1.3 cm. Sonography of the left axilla was negative.  Biopsy of the left breast mass in question 120 10/29/2014 showed ([id number]) an invasive breast cancer with squamous features. It was estrogen and progesterone receptor negative. There was no HER-2 amplification, the signals ratio being 0.82 and the number per cell 1.80. The  MIB-1 was 10%.  The patient has met with Dr. Donne Hazel in surgery and Dr. Pablo Ledger in radiation oncology. The patient understands there is no survival difference between lumpectomy and radiation compared with mastectomy. The patient will need sentinel lymph node sampling with either of those procedures. If she does have a lumpectomy, she will benefit from adjuvant radiation. The patient has been set up for genetics testing and this may affect her ultimate surgical decision.  Her subsequent history is as detailed below  INTERVAL HISTORY: Etta returns today for follow-up of her breast cancer, accompanied by her daughter, [name]. Today is day 1 cycle 3 of 4 planned cycles of cyclophosphamide and docetaxel, given every 21 days, with Neulasta on day 2. She visited our symptom management clinic last week for diarrhea after the antibiotics prescribed after her last visit. The antibiotics were stopped and she was given IV fluids. Her hgb was also low at this time and she was given blood.  REVIEW OF SYSTEMS: Allye denies fevers, chills, nausea, or vomiting. Her diarrhea has resolved. She is eating better. She continue to have left lower quadrant pain, but it is milder. Her energy level is improved. She denies mouth sores, rashes, or peripheral neuropathy symptoms. She is sleeping well at night a detailed review of systems is otherwise stable.  PAST MEDICAL HISTORY: Past Medical History  Diagnosis Date  . OA (osteoarthritis)   . History of DVT (deep vein thrombosis)   . Hyperlipidemia   . Hypertension   . Hypothyroidism   . HX: breast cancer     melanoma  . Melanoma   . Eczema   . Arthritis   . Esophageal stricture   . GERD (gastroesophageal reflux disease)   . PONV (postoperative  nausea and vomiting)   . Wears glasses   . Depression     states no depression  . Chronic kidney disease     some renal impairment from meds  . Headache   . Anemia     from medicaions  . Heart murmur     states it  is benign told 30-40 years ago  . Pneumothorax on right 06/15/2014    PAST SURGICAL HISTORY: Past Surgical History  Procedure Laterality Date  . Appendectomy  1967  . Melanoma excision  2004    right side of face  . 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 Total abdominal hysterectomy  1992    including ovaries  . Tonsillectomy and adenoidectomy  1949  . Lipoma excision  1978    right breast  . Mastectomy  1998    right-nodes out  . Breast lumpectomy      left breast-benign  . Breast surgery  1977    removal of calcified milk gland right breast  . Colonoscopy    . Radioactive seed guided mastectomy with axillary sentinel lymph node biopsy Left 05/05/2014    Procedure: RADIOACTIVE SEED GUIDED LEFT LUMPECTOMY WITH AXILLARY SENTINEL LYMPH NODE BIOPSY;  Surgeon: Rolm Bookbinder, MD;  Location: [facility];  Service: General;  Laterality: Left;  . Chest tube insertion  06/15/2014  . Portacath placement Left 06/15/2014    Procedure: INSERTION PORT-A-CATH;  Surgeon: Rolm Bookbinder, MD;  Location: [place];  Service: General;  Laterality: Left;  . Chest tube insertion Bilateral 06/15/2014    Procedure: CHEST TUBE INSERTION;  Surgeon: Rolm Bookbinder, MD;  Location: [place];  Service: General;  Laterality: Bilateral;    FAMILY HISTORY Family History  Problem Relation Age of Onset  . Heart disease Father 10    smoker  . Endometrial cancer Mother 29  . Breast cancer Cousin     x 3 cousins with breast cancer (one at 47, one at 42 and one at 13)  . Lung cancer Paternal Aunt     smoker  . Brain cancer Maternal Aunt   . Colon cancer Neg Hx   . Diabetes Maternal Aunt     ???  . Breast cancer Maternal Aunt 51  The patient's father died at the age of 70 from a myocardial infarction. The patient's mother died at the age of 50 from metastatic endometrial cancer which had been diagnosed 2 years before. The patient had no brothers, one sister. The patient's mother's sister  was diagnosed with breast cancer but the patient does not know at what age. A second maternal aunt was diagnosed with brain cancer. The patient has 3 cousins on her mother's side diagnosed with breast cancer, one of them under the age of 43. On the father's side there is a history of lung and cervical cancer  GYNECOLOGIC HISTORY:  No LMP recorded. Patient has had a hysterectomy. Menarche age 50, first live birth age 40. The patient is GX P1. She underwent hysterectomy with bilateral salpingo-oophorectomy in 1991. She used hormone replacement for approximately 7 years until 1998, when she had her right-sided breast cancer. She used oral contraceptives for approximately one year with no complications. 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 She is widowed and lives alone with no pets. Her main  hobby is painting. Her daughter Faith Rogue lives in [place] and is also a homemaker. The patient has 2 grandchildren. She attends wholly [employer]. The patient's daughter [name] is her healthcare power of attorney. [name] can be reached at [phone]   HEALTH MAINTENANCE: History  Substance Use Topics  . Smoking status: Former Smoker -- 0.75 packs/day for 3 years    Types: Cigarettes    Quit date: 03/11/1969  . Smokeless tobacco: Never Used  . Alcohol Use: 0.0 oz/week    0 Standard drinks or equivalent per week     Comment: occasional wine     Colonoscopy: 20/10/John [facility] Pastor  GGE:ZMOQHU post hysterectomy  Bone density:2014/osteopenia  Lipid panel:  Allergies  Allergen Reactions  . Dyazide [Hydrochlorothiazide W-Triamterene]     Presumption: drug induced vasculitis  . Niacin Other (See Comments)    flushing  . Penicillins Diarrhea and Nausea And Vomiting    Current Outpatient Prescriptions    Medication Sig Dispense Refill  . atorvastatin (LIPITOR) 20 MG tablet Take 1 tablet (20 mg total) by mouth daily. (Patient taking differently: Take 10 mg by mouth daily. ) 90 tablet 3  . Calcium-Vitamin D (CALTRATE 600 PLUS-VIT D PO) Take 1 tablet by mouth 2 (two) times daily.     . Carboxymethylcellulose Sodium (REFRESH LIQUIGEL OP) Apply 1 drop to eye at bedtime as needed (dry eyes).    . cycloSPORINE (RESTASIS) 0.05 % ophthalmic emulsion Place 1 drop into both eyes 2 (two) times daily. 30 mL 3  . dexamethasone (DECADRON) 4 MG tablet Take 2 tablets (8 mg total) by mouth 2 (two) times daily. Start the day before Taxotere. Then again the day after chemo for 3 days. 30 tablet 1  . esomeprazole (NEXIUM) 40 MG capsule TAKE 1 CAPSULE DAILY (Patient taking differently: Take 40 mg by mouth daily. TAKE 1 CAPSULE DAILY) 90 capsule 3  . fexofenadine (ALLEGRA) 180 MG tablet Take 180 mg by mouth daily as needed for allergies.     [name] ipratropium (ATROVENT) 0.03 % nasal spray Place 2 sprays into the nose every 12 (twelve) hours. 180 mL 4  . Lactobacillus-Inulin (CULTURELLE DIGESTIVE HEALTH) CHEW Chew 1 capsule by mouth daily.     [name] levothyroxine (SYNTHROID, LEVOTHROID) 75 MCG tablet Take 1 tablet (75 mcg total) by mouth daily. 90 tablet 2  . lidocaine-prilocaine (EMLA) cream Apply to affected area once 30 g 3  . LORazepam (ATIVAN) 0.5 MG tablet 1/2 to 1 tab po q 8 hours prn 30 tablet 1  . nadolol (CORGARD) 20 MG tablet Take 0.5 tablets (10 mg total) by mouth daily. 90 tablet 1  . Polyethyl Glycol-Propyl Glycol (SYSTANE) 0.4-0.3 % SOLN Apply 1 drop to eye daily as needed (for dryness/itching).     . prochlorperazine (COMPAZINE) 10 MG tablet Take 1 tablet (10 mg total) by mouth every 6 (six) hours as needed (Nausea or vomiting). 30 tablet 1  . venlafaxine XR (EFFEXOR-XR) 150 MG 24 hr capsule Take 1 capsule (150 mg total) by mouth daily. 180 capsule 0  . acetaminophen (TYLENOL) 500 MG tablet Take 500 mg by mouth 2  (two) times daily as needed for mild pain.     . ciprofloxacin (CIPRO) 500 MG tablet Take 1 tablet (500 mg total) by mouth 2 (two) times daily. (Patient not taking: Reported on 08/22/2014) 20 tablet 0  . docusate sodium (COLACE) 100 MG capsule Take 1 capsule (100 mg total) by mouth 2 (two) times daily. (  Patient not taking: Reported on 08/22/2014) 40 capsule 0  . traMADol (ULTRAM) 50 MG tablet TAKE 1 TABLET BY MOUTH EVERY 6 HOURS AS NEEDED FOR MODERATE/SEVERE PAIN (Patient not taking: Reported on 08/09/2014) 20 tablet 0   No current facility-administered medications for this visit.    OBJECTIVE: Older white womanin who appears younger than stated age 48 Vitals:   08/22/14 1022  BP: 136/65  Pulse: 88  Temp: 97.7 F (36.5 C)  Resp: 18     Body mass index is 23.19 kg/(m^2).    ECOG FS:1 - Symptomatic but completely ambulatory  Sclerae unicteric, pupils round and equal Oropharynx clear and moist-- no thrush or other lesions No cervical or supraclavicular adenopathy Lungs no rales or rhonchi Heart regular rate and rhythm Abd soft, nontender, positive bowel sounds MSK no focal spinal tenderness, no upper extremity lymphedema Neuro: nonfocal, well oriented, appropriate affect Breasts: deferred  LAB RESULTS:  CMP     Component Value Date/Time   NA 134* 08/22/2014 0958   NA 138 06/14/2014 1025   K 4.3 08/22/2014 0958   K 4.1 06/14/2014 1025   CL 100 06/14/2014 1025   CO2 22 08/22/2014 0958   CO2 33* 06/14/2014 1025   GLUCOSE 142* 08/22/2014 0958   GLUCOSE 79 06/14/2014 1025   GLUCOSE 88 02/06/2006 1059   BUN 18.1 08/22/2014 0958   BUN 16 06/14/2014 1025   CREATININE 1.1 08/22/2014 0958   CREATININE 0.97 06/14/2014 1025   CALCIUM 9.7 08/22/2014 0958   CALCIUM 9.9 06/14/2014 1025   PROT 7.2 08/22/2014 0958   PROT 8.5* 12/21/2013 1004   ALBUMIN 3.9 08/22/2014 0958   ALBUMIN 4.2 12/21/2013 1004   AST 16 08/22/2014 0958   AST 24 12/21/2013 1004   ALT 19 08/22/2014 0958   ALT  19 12/21/2013 1004   ALKPHOS 78 08/22/2014 0958   ALKPHOS 64 12/21/2013 1004   BILITOT 0.45 08/22/2014 0958   BILITOT 0.8 12/21/2013 1004   GFRNONAA 56* 06/14/2014 1025   GFRAA 65* 06/14/2014 1025    INo results found for: SPEP, UPEP  Lab Results  Component Value Date   WBC 19.1* 08/22/2014   NEUTROABS 17.1* 08/22/2014   HGB 12.6 08/22/2014   HCT 37.7 08/22/2014   MCV 94.5 08/22/2014   PLT 255 08/22/2014      Chemistry      Component Value Date/Time   NA 134* 08/22/2014 0958   NA 138 06/14/2014 1025   K 4.3 08/22/2014 0958   K 4.1 06/14/2014 1025   CL 100 06/14/2014 1025   CO2 22 08/22/2014 0958   CO2 33* 06/14/2014 1025   BUN 18.1 08/22/2014 0958   BUN 16 06/14/2014 1025   CREATININE 1.1 08/22/2014 0958   CREATININE 0.97 06/14/2014 1025      Component Value Date/Time   CALCIUM 9.7 08/22/2014 0958   CALCIUM 9.9 06/14/2014 1025   ALKPHOS 78 08/22/2014 0958   ALKPHOS 64 12/21/2013 1004   AST 16 08/22/2014 0958   AST 24 12/21/2013 1004   ALT 19 08/22/2014 0958   ALT 19 12/21/2013 1004   BILITOT 0.45 08/22/2014 0958   BILITOT 0.8 12/21/2013 1004       No results found for: LABCA2  No components found for: [id number]  No results for input(s): INR in the last 168 hours.  Urinalysis    Component Value Date/Time   COLORURINE YELLOW 05/17/2013 1330   APPEARANCEUR CLEAR 05/17/2013 1330   LABSPEC 1.005 08/09/2014 1052   LABSPEC 1.025  05/17/2013 1330   PHURINE 7.0 08/09/2014 1052   PHURINE 5.5 05/17/2013 1330   GLUCOSEU Negative 08/09/2014 1052   GLUCOSEU NEGATIVE 05/17/2013 1330   HGBUR Negative 08/09/2014 1052   HGBUR NEGATIVE 05/17/2013 1330   BILIRUBINUR Negative 08/09/2014 1052   BILIRUBINUR n 06/21/2013 1115   BILIRUBINUR NEGATIVE 05/17/2013 1330   KETONESUR Negative 08/09/2014 1052   KETONESUR NEGATIVE 05/17/2013 1330   PROTEINUR Negative 08/09/2014 1052   PROTEINUR n 06/21/2013 1115   PROTEINUR NEGATIVE 05/17/2013 1330   UROBILINOGEN 0.2  08/09/2014 1052   UROBILINOGEN 0.2 06/21/2013 1115   UROBILINOGEN 0.2 05/17/2013 1330   NITRITE Negative 08/09/2014 1052   NITRITE n 06/21/2013 1115   NITRITE NEGATIVE 05/17/2013 1330   LEUKOCYTESUR Negative 08/09/2014 1052   LEUKOCYTESUR Trace 06/21/2013 1115    STUDIES: No results found.  ASSESSMENT: 74 y.o. [place] woman  (1) status post left modified radical mastectomy November 1998 for a pT2 pN0, stage IIA invasive ductal carcinoma, grade 2, estrogen and progesterone receptor positive, HER-2 not amplified  (a) adjuvant chemotherapy consisted of doxorubicin and cyclophosphamide 4  (b) adjuvant anti-estrogens consisted of tamoxifen, then letrozole, for a total of 5 years  (2) status post left breast lower outer quadrant biopsy 04/07/2014 for a clinical T1c N0,stage IA  invasive ductal carcinoma, with squamous features, estrogen and progesterone receptor negative, HER-2 not amplified, with an MIB-1 of 10%   (3) left lumpectomy and sentinel lymph node biopsy 05/05/2014 showed aT1c pN0, stage IA invasive ductal carcinoma, grade 1, triple negative, with close but negative margins.   (4) Mammaprint classifies the tumor as basal like, high risk, and predicts a 30% chance of recurrence within 10 years with local treatment only.   (5) start of treatment delayed because of bilateral pneumothoraces after port placement. adjuvant chemotherapy starting 07/11/2014 to consist of carboplatin and docetaxel given every 21 days 4 with Neulasta support.  (6) genetics testing March 2016 showed no deleterious mutation in the [employer] panel  [[employer]] including sequencing and rearrangement analysis for the following 24 genes:ATM, BARD1, BRCA1, BRCA2, BRIP1, CDH1, CHEK2, EPCAM, MLH1, MRE11A, MSH2, MSH6, MUTYH, NBN, NF1, PALB2, PMS2, PTEN, RAD50, RAD51C, RAD51D, SMARCA4, STK11, and TP53.   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      PLAN: Sharlie is much improved today. The labs were reviewed in detail, and since getting blood, her hgb is up to 12.6. She will proceed with cycle 3 of carboplatin and docetaxel as planned today.   Kenslei will return next week for labs and a nadir visit. I have put in a request for IV fluids on that day as well, as she seems to improve with this midway through her cycle. We will discuss next week, and cancel if necessary. Ezrah understands and agrees with this plan. She knows the goal of treatment in her case is cure. She has been encouraged to call with any issues that might arise before her next visit here.    Laurie Panda, NP   08/22/2014 10:49 AM

## 2014-08-23 ENCOUNTER — Other Ambulatory Visit: Payer: Medicare Other

## 2014-08-23 ENCOUNTER — Ambulatory Visit (HOSPITAL_BASED_OUTPATIENT_CLINIC_OR_DEPARTMENT_OTHER): Payer: Medicare Other

## 2014-08-23 ENCOUNTER — Telehealth: Payer: Self-pay | Admitting: *Deleted

## 2014-08-23 VITALS — BP 120/54 | HR 68 | Temp 98.2°F

## 2014-08-23 DIAGNOSIS — C50912 Malignant neoplasm of unspecified site of left female breast: Secondary | ICD-10-CM

## 2014-08-23 DIAGNOSIS — Z5189 Encounter for other specified aftercare: Secondary | ICD-10-CM | POA: Diagnosis not present

## 2014-08-23 DIAGNOSIS — C50512 Malignant neoplasm of lower-outer quadrant of left female breast: Secondary | ICD-10-CM | POA: Diagnosis present

## 2014-08-23 DIAGNOSIS — C50911 Malignant neoplasm of unspecified site of right female breast: Secondary | ICD-10-CM

## 2014-08-23 MED ORDER — PEGFILGRASTIM INJECTION 6 MG/0.6ML ~~LOC~~
6.0000 mg | PREFILLED_SYRINGE | Freq: Once | SUBCUTANEOUS | Status: AC
  Administered 2014-08-23: 6 mg via SUBCUTANEOUS
  Filled 2014-08-23: qty 0.6

## 2014-08-23 NOTE — Telephone Encounter (Signed)
Pt's daughter called earlier with a concern whether her mother could take the Decadron a few days longer after tx b/c of her having the episode of extreme fatigue after stopping the steroid once she is done with the prescribed days of the "roadmap". Explained to pt that her mother can take the Decadron once a day for 3 add'l days then stop to see if this might help her to have a little more energy per Gentry Fitz, NP. I did explain to daughter we are cautious with this b/c of maintaining bone health. Pt's daughter verbalized understanding. No further concerns. Message to be forwarded to Gentry Fitz, NP.

## 2014-08-29 ENCOUNTER — Encounter: Payer: Self-pay | Admitting: Nurse Practitioner

## 2014-08-29 ENCOUNTER — Other Ambulatory Visit (HOSPITAL_BASED_OUTPATIENT_CLINIC_OR_DEPARTMENT_OTHER): Payer: Medicare Other

## 2014-08-29 ENCOUNTER — Telehealth: Payer: Self-pay | Admitting: Nurse Practitioner

## 2014-08-29 ENCOUNTER — Ambulatory Visit (HOSPITAL_BASED_OUTPATIENT_CLINIC_OR_DEPARTMENT_OTHER): Payer: Medicare Other | Admitting: Nurse Practitioner

## 2014-08-29 ENCOUNTER — Ambulatory Visit (HOSPITAL_BASED_OUTPATIENT_CLINIC_OR_DEPARTMENT_OTHER): Payer: Medicare Other

## 2014-08-29 VITALS — BP 117/56 | HR 71 | Temp 97.7°F | Resp 18

## 2014-08-29 VITALS — BP 132/70 | HR 87 | Temp 97.5°F | Resp 18 | Ht 66.0 in | Wt 136.5 lb

## 2014-08-29 DIAGNOSIS — C50812 Malignant neoplasm of overlapping sites of left female breast: Secondary | ICD-10-CM

## 2014-08-29 DIAGNOSIS — C50912 Malignant neoplasm of unspecified site of left female breast: Secondary | ICD-10-CM

## 2014-08-29 DIAGNOSIS — B370 Candidal stomatitis: Secondary | ICD-10-CM

## 2014-08-29 DIAGNOSIS — R5383 Other fatigue: Secondary | ICD-10-CM

## 2014-08-29 DIAGNOSIS — C50911 Malignant neoplasm of unspecified site of right female breast: Secondary | ICD-10-CM

## 2014-08-29 DIAGNOSIS — Z171 Estrogen receptor negative status [ER-]: Secondary | ICD-10-CM | POA: Diagnosis not present

## 2014-08-29 DIAGNOSIS — Z853 Personal history of malignant neoplasm of breast: Secondary | ICD-10-CM | POA: Diagnosis not present

## 2014-08-29 DIAGNOSIS — R197 Diarrhea, unspecified: Secondary | ICD-10-CM

## 2014-08-29 DIAGNOSIS — E860 Dehydration: Secondary | ICD-10-CM

## 2014-08-29 DIAGNOSIS — K1230 Oral mucositis (ulcerative), unspecified: Secondary | ICD-10-CM | POA: Diagnosis not present

## 2014-08-29 LAB — COMPREHENSIVE METABOLIC PANEL (CC13)
ALK PHOS: 98 U/L (ref 40–150)
ALT: 37 U/L (ref 0–55)
AST: 23 U/L (ref 5–34)
Albumin: 4.1 g/dL (ref 3.5–5.0)
Anion Gap: 11 mEq/L (ref 3–11)
BUN: 21.6 mg/dL (ref 7.0–26.0)
CO2: 27 mEq/L (ref 22–29)
Calcium: 10.1 mg/dL (ref 8.4–10.4)
Chloride: 94 mEq/L — ABNORMAL LOW (ref 98–109)
Creatinine: 1 mg/dL (ref 0.6–1.1)
EGFR: 53 mL/min/{1.73_m2} — AB (ref >90)
Glucose: 84 mg/dl (ref 70–140)
Potassium: 4.4 mEq/L (ref 3.5–5.1)
SODIUM: 133 meq/L — AB (ref 136–145)
TOTAL PROTEIN: 7.4 g/dL (ref 6.4–8.3)
Total Bilirubin: 0.92 mg/dL (ref 0.20–1.20)

## 2014-08-29 LAB — CBC WITH DIFFERENTIAL/PLATELET
BASO%: 0.2 % (ref 0.0–2.0)
BASOS ABS: 0 10*3/uL (ref 0.0–0.1)
EOS ABS: 0.1 10*3/uL (ref 0.0–0.5)
EOS%: 0.5 % (ref 0.0–7.0)
HCT: 43 % (ref 34.8–46.6)
HEMOGLOBIN: 14.7 g/dL (ref 11.6–15.9)
LYMPH%: 18.4 % (ref 14.0–49.7)
MCH: 31.7 pg (ref 25.1–34.0)
MCHC: 34.2 g/dL (ref 31.5–36.0)
MCV: 92.9 fL (ref 79.5–101.0)
MONO#: 2.1 10*3/uL — ABNORMAL HIGH (ref 0.1–0.9)
MONO%: 22.1 % — AB (ref 0.0–14.0)
NEUT%: 58.8 % (ref 38.4–76.8)
NEUTROS ABS: 5.5 10*3/uL (ref 1.5–6.5)
Platelets: 160 10*3/uL (ref 145–400)
RBC: 4.63 10*6/uL (ref 3.70–5.45)
RDW: 16.9 % — ABNORMAL HIGH (ref 11.2–14.5)
WBC: 9.4 10*3/uL (ref 3.9–10.3)
lymph#: 1.7 10*3/uL (ref 0.9–3.3)
nRBC: 0 % (ref 0–0)

## 2014-08-29 MED ORDER — CHOLESTYRAMINE 4 G PO PACK: 4.0000 g | PACK | Freq: Three times a day (TID) | ORAL | Status: DC

## 2014-08-29 MED ORDER — HEPARIN SOD (PORK) LOCK FLUSH 100 UNIT/ML IV SOLN
500.0000 [IU] | Freq: Once | INTRAVENOUS | Status: AC | PRN
  Administered 2014-08-29: 500 [IU]
  Filled 2014-08-29: qty 5

## 2014-08-29 MED ORDER — SODIUM CHLORIDE 0.9 % IV SOLN
Freq: Once | INTRAVENOUS | Status: AC
  Administered 2014-08-29: 13:00:00 via INTRAVENOUS
  Filled 2014-08-29: qty 4

## 2014-08-29 MED ORDER — SODIUM CHLORIDE 0.9 % IV SOLN
1000.0000 mL | Freq: Once | INTRAVENOUS | Status: AC
  Administered 2014-08-29: 1000 mL via INTRAVENOUS

## 2014-08-29 MED ORDER — NYSTATIN 100000 UNIT/ML MT SUSP: 5.0000 mL | Freq: Four times a day (QID) | OROMUCOSAL | Status: DC

## 2014-08-29 MED ORDER — SODIUM CHLORIDE 0.9 % IJ SOLN
10.0000 mL | INTRAMUSCULAR | Status: DC | PRN
  Administered 2014-08-29: 10 mL
  Filled 2014-08-29: qty 10

## 2014-08-29 NOTE — Patient Instructions (Signed)
Dehydration, Adult Dehydration is when you lose more fluids from the body than you take in. Vital organs like the kidneys, brain, and heart cannot function without a proper amount of fluids and salt. Any loss of fluids from the body can cause dehydration.  CAUSES   Vomiting.  Diarrhea.  Excessive sweating.  Excessive urine output.  Fever. SYMPTOMS  Mild dehydration  Thirst.  Dry lips.  Slightly dry mouth. Moderate dehydration  Very dry mouth.  Sunken eyes.  Skin does not bounce back quickly when lightly pinched and released.  Dark urine and decreased urine production.  Decreased tear production.  Headache. Severe dehydration  Very dry mouth.  Extreme thirst.  Rapid, weak pulse (more than 100 beats per minute at rest).  Cold hands and feet.  Not able to sweat in spite of heat and temperature.  Rapid breathing.  Blue lips.  Confusion and lethargy.  Difficulty being awakened.  Minimal urine production.  No tears. DIAGNOSIS  Your caregiver will diagnose dehydration based on your symptoms and your exam. Blood and urine tests will help confirm the diagnosis. The diagnostic evaluation should also identify the cause of dehydration. TREATMENT  Treatment of mild or moderate dehydration can often be done at home by increasing the amount of fluids that you drink. It is best to drink small amounts of fluid more often. Drinking too much at one time can make vomiting worse. Refer to the home care instructions below. Severe dehydration needs to be treated at the hospital where you will probably be given intravenous (IV) fluids that contain water and electrolytes. HOME CARE INSTRUCTIONS   Ask your caregiver about specific rehydration instructions.  Drink enough fluids to keep your urine clear or pale yellow.  Drink small amounts frequently if you have nausea and vomiting.  Eat as you normally do.  Avoid:  Foods or drinks high in sugar.  Carbonated  drinks.  Juice.  Extremely hot or cold fluids.  Drinks with caffeine.  Fatty, greasy foods.  Alcohol.  Tobacco.  Overeating.  Gelatin desserts.  Wash your hands well to avoid spreading bacteria and viruses.  Only take over-the-counter or prescription medicines for pain, discomfort, or fever as directed by your caregiver.  Ask your caregiver if you should continue all prescribed and over-the-counter medicines.  Keep all follow-up appointments with your caregiver. SEEK MEDICAL CARE IF:  You have abdominal pain and it increases or stays in one area (localizes).  You have a rash, stiff neck, or severe headache.  You are irritable, sleepy, or difficult to awaken.  You are weak, dizzy, or extremely thirsty. SEEK IMMEDIATE MEDICAL CARE IF:   You are unable to keep fluids down or you get worse despite treatment.  You have frequent episodes of vomiting or diarrhea.  You have blood or green matter (bile) in your vomit.  You have blood in your stool or your stool looks black and tarry.  You have not urinated in 6 to 8 hours, or you have only urinated a small amount of very dark urine.  You have a fever.  You faint. MAKE SURE YOU:   Understand these instructions.  Will watch your condition.  Will get help right away if you are not doing well or get worse. Document Released: 02/25/2005 Document Revised: 05/20/2011 Document Reviewed: 10/15/2010 ExitCare Patient Information 2015 [employer]. This information is not intended to replace advice given to you by your health care provider. Make sure you discuss any questions you have with your health care   provider.  

## 2014-08-29 NOTE — Telephone Encounter (Signed)
Appointments made per pof,radiation onc will call the patient with dr Pablo Ledger appointment and patient will receive a new avs in chemo

## 2014-08-29 NOTE — Progress Notes (Signed)
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 Non-of the 25 lymph nodes removed from the right axilla were involved. The tumor was estrogen and progesterone receptor positive, HER-2 not amplified. She was treated adjuvantly with doxorubicin and cyclophosphamide 4, then received 5 years of antiestrogen therapy (initially tamoxifen, later letrozole). There has been no evidence of disease recurrence on the right.  More recently, Laura Li had routine left screening mamm[facility] 2014. This showed a suspicious area of architectural distortion in the inferior portion of the left breast. On 40/10/6759 Laura Li underwent left diagnostic mammography with ultrasonography. Spot compression views confirmed the presence 7 area of distortion in the lower central left breast, which was not palpable by physical exam (there was a prior biopsy scar in the upper outer quadrant of the left breast). Ultrasound confirmed a hypoechoic spiculated mass at the 6:00 location in the left breast, measuring maximally 1.3 cm. Sonography of the left axilla was negative.  Biopsy of the left breast mass in question 120 10/29/2014 showed ([id number]) an invasive breast cancer with squamous features. It was estrogen and progesterone receptor negative. There was no HER-2 amplification, the signals ratio being 0.82 and the number per cell 1.80. The  MIB-1 was 10%.  The patient has met with Dr. Donne Hazel in surgery and Dr. Pablo Ledger in radiation oncology. The patient understands there is no survival difference between lumpectomy and radiation compared with mastectomy. The patient will need sentinel lymph node sampling with either of those procedures. If she does have a lumpectomy, she will benefit from adjuvant radiation. The patient has been set up for genetics testing and this may affect her ultimate surgical decision.  Her subsequent history is as detailed below  INTERVAL HISTORY: Laura Li returns today for follow-up of her breast cancer, accompanied by her daughter, Laura Li. Today is day 8 cycle 3 of 4 planned cycles of cyclophosphamide and docetaxel, given every 21 days, with Neulasta on day 2.   REVIEW OF SYSTEMS: Unfortunately her diarrhea started back up after treatment and continues to today. She is having 3-5 bowel movements daily, and while she is probably underdosing on the imodium, it is not very helpful at this time. Her appetite and water intake is down secondary to taste changes. She a thrush coating to her tongue. Her left lower quadrant continues to ache mildly from time to time. She is fatigued and her gait is shuffled. A detailed review of systems is otherwise stable.  PAST MEDICAL HISTORY: Past Medical History  Diagnosis Date  . OA (osteoarthritis)   . History of DVT (deep vein thrombosis)   . Hyperlipidemia   . Hypertension   . Hypothyroidism   . HX: breast cancer     melanoma  . Melanoma   . Eczema   . Arthritis   . Esophageal stricture   . GERD (gastroesophageal reflux disease)   . PONV (postoperative nausea and vomiting)   . Wears glasses   . Depression  states no depression  . Chronic kidney disease     some renal impairment from meds  . Headache   . Anemia     from medicaions  . Heart murmur     states it is benign told 30-40 years ago  . Pneumothorax on right 06/15/2014    PAST SURGICAL  HISTORY: Past Surgical History  Procedure Laterality Date  . Appendectomy  1967  . Melanoma excision  2004    right side of face  . 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 Total abdominal hysterectomy  1992    including ovaries  . Tonsillectomy and adenoidectomy  1949  . Lipoma excision  1978    right breast  . Mastectomy  1998    right-nodes out  . Breast lumpectomy      left breast-benign  . Breast surgery  1977    removal of calcified milk gland right breast  . Colonoscopy    . Radioactive seed guided mastectomy with axillary sentinel lymph node biopsy Left 05/05/2014    Procedure: RADIOACTIVE SEED GUIDED LEFT LUMPECTOMY WITH AXILLARY SENTINEL LYMPH NODE BIOPSY;  Surgeon: Rolm Bookbinder, MD;  Location: [place];  Service: General;  Laterality: Left;  . Chest tube insertion  06/15/2014  . Portacath placement Left 06/15/2014    Procedure: INSERTION PORT-A-CATH;  Surgeon: Rolm Bookbinder, MD;  Location: [place];  Service: General;  Laterality: Left;  . Chest tube insertion Bilateral 06/15/2014    Procedure: CHEST TUBE INSERTION;  Surgeon: Rolm Bookbinder, MD;  Location: [place];  Service: General;  Laterality: Bilateral;    FAMILY HISTORY Family History  Problem Relation Age of Onset  . Heart disease Father 19    smoker  . Endometrial cancer Mother 29  . Breast cancer Cousin     x 3 cousins with breast cancer (one at 8, one at 39 and one at 58)  . Lung cancer Paternal Aunt     smoker  . Brain cancer Maternal Aunt   . Colon cancer Neg Hx   . Diabetes Maternal Aunt     ???  . Breast cancer Maternal Aunt 51  The patient's father died at the age of 40 from a myocardial infarction. The patient's mother died at the age of 64 from metastatic endometrial cancer which had been diagnosed 2 years before. The patient had no brothers, one sister. The patient's mother's sister was diagnosed with breast cancer but the patient does not know at what age. A second  maternal aunt was diagnosed with brain cancer. The patient has 3 cousins on her mother's side diagnosed with breast cancer, one of them under the age of 31. On the father's side there is a history of lung and cervical cancer  GYNECOLOGIC HISTORY:  No LMP recorded. Patient has had a hysterectomy. Menarche age 39, first live birth age 35. The patient is GX P1. She underwent hysterectomy with bilateral salpingo-oophorectomy in 1991. She used hormone replacement for approximately 7 years until 1998, when she had her right-sided breast cancer. She used oral contraceptives for approximately one year with no complications. 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 She is widowed and lives alone with no pets. Her main hobby is painting. Her daughter Laura Li lives in [place] and is also a homemaker. The  patient has 2 grandchildren. She attends [employer]. The patient's daughter Laura Li is her healthcare power of attorney. Laura Li can be reached at [phone]   HEALTH MAINTENANCE: History  Substance Use Topics  . Smoking status: Former Smoker -- 0.75 packs/day for 3 years    Types: Cigarettes    Quit date: 03/11/1969  . Smokeless tobacco: Never Used  . Alcohol Use: 0.0 oz/week    0 Standard drinks or equivalent per week     Comment: occasional wine     Colonoscopy: 20/10/[facility]  QKM:MNOTRR post hysterectomy  Bone density:2014/osteopenia  Lipid panel:  Allergies  Allergen Reactions  . Dyazide [Hydrochlorothiazide W-Triamterene]     Presumption: drug induced vasculitis  . Niacin Other (See Comments)    flushing  . Penicillins Diarrhea and Nausea And Vomiting    Current Outpatient Prescriptions  Medication Sig Dispense Refill  . atorvastatin (LIPITOR) 20 MG tablet Take 1 tablet (20 mg  total) by mouth daily. (Patient taking differently: Take 10 mg by mouth daily. ) 90 tablet 3  . Calcium-Vitamin D (CALTRATE 600 PLUS-VIT D PO) Take 1 tablet by mouth 2 (two) times daily.     . Carboxymethylcellulose Sodium (REFRESH LIQUIGEL OP) Apply 1 drop to eye at bedtime as needed (dry eyes).    . cycloSPORINE (RESTASIS) 0.05 % ophthalmic emulsion Place 1 drop into both eyes 2 (two) times daily. 30 mL 3  . esomeprazole (NEXIUM) 40 MG capsule TAKE 1 CAPSULE DAILY (Patient taking differently: Take 40 mg by mouth daily. TAKE 1 CAPSULE DAILY) 90 capsule 3  . ipratropium (ATROVENT) 0.03 % nasal spray Place 2 sprays into the nose every 12 (twelve) hours. 180 mL 4  . Lactobacillus-Inulin (CULTURELLE DIGESTIVE HEALTH) CHEW Chew 1 capsule by mouth daily.     [name] levothyroxine (SYNTHROID, LEVOTHROID) 75 MCG tablet Take 1 tablet (75 mcg total) by mouth daily. 90 tablet 2  . nadolol (CORGARD) 20 MG tablet Take 0.5 tablets (10 mg total) by mouth daily. 90 tablet 1  . Polyethyl Glycol-Propyl Glycol (SYSTANE) 0.4-0.3 % SOLN Apply 1 drop to eye daily as needed (for dryness/itching).     . venlafaxine XR (EFFEXOR-XR) 150 MG 24 hr capsule Take 1 capsule (150 mg total) by mouth daily. 180 capsule 0  . acetaminophen (TYLENOL) 500 MG tablet Take 500 mg by mouth 2 (two) times daily as needed for mild pain.     . cholestyramine (QUESTRAN) 4 G packet Take 1 packet (4 g total) by mouth 3 (three) times daily with meals. 60 each 1  . dexamethasone (DECADRON) 4 MG tablet Take 2 tablets (8 mg total) by mouth 2 (two) times daily. Start the day before Taxotere. Then again the day after chemo for 3 days. (Patient not taking: Reported on 08/29/2014) 30 tablet 1  . docusate sodium (COLACE) 100 MG capsule Take 1 capsule (100 mg total) by mouth 2 (two) times daily. (Patient not taking: Reported on 08/22/2014) 40 capsule 0  . fexofenadine (ALLEGRA) 180 MG tablet Take 180 mg by mouth daily as needed for allergies.     [name]  lidocaine-prilocaine (EMLA) cream Apply to affected area once (Patient not taking: Reported on 08/29/2014) 30 g 3  . LORazepam (ATIVAN) 0.5 MG tablet 1/2 to 1 tab po q 8 hours prn (Patient not taking: Reported on 08/29/2014) 30 tablet 1  . nystatin (MYCOSTATIN) 100000 UNIT/ML suspension Take 5 mLs (500,000 Units total) by mouth 4 (four) times daily. 240 mL  0  . prochlorperazine (COMPAZINE) 10 MG tablet Take 1 tablet (10 mg total) by mouth every 6 (six) hours as needed (Nausea or vomiting). (Patient not taking: Reported on 08/29/2014) 30 tablet 1  . traMADol (ULTRAM) 50 MG tablet TAKE 1 TABLET BY MOUTH EVERY 6 HOURS AS NEEDED FOR MODERATE/SEVERE PAIN (Patient not taking: Reported on 08/09/2014) 20 tablet 0   No current facility-administered medications for this visit.   Facility-Administered Medications Ordered in Other Visits  Medication Dose Route Frequency Provider Last Rate Last Dose  . 0.9 %  sodium chloride infusion  1,000 mL Intravenous Once Laura Panda, NP 500 mL/hr at 08/29/14 1240 1,000 mL at 08/29/14 1240  . heparin lock flush 100 unit/mL  500 Units Intracatheter Once PRN Laura Panda, NP      . sodium chloride 0.9 % injection 10 mL  10 mL Intracatheter PRN Laura Panda, NP        OBJECTIVE: Older white womanin who appears younger than stated age 45 Vitals:   08/29/14 1102  BP: 132/70  Pulse: 87  Temp: 97.5 F (36.4 C)  Resp: 18     Body mass index is 22.04 kg/(m^2).    ECOG FS:1 - Symptomatic but completely ambulatory  Skin: warm, dry  HEENT: sclerae anicteric, conjunctivae pink, oropharynx clear. Thrush coating to tongue Lymph Nodes: No cervical or supraclavicular lymphadenopathy  Lungs: clear to auscultation bilaterally, no rales, wheezes, or rhonci  Heart: regular rate and rhythm  Abdomen: round, soft, non tender, positive bowel sounds  Musculoskeletal: No focal spinal tenderness, no peripheral edema  Neuro: non focal, well oriented, positive affect   Breasts: deferred  LAB RESULTS:  CMP     Component Value Date/Time   NA 133* 08/29/2014 1051   NA 138 06/14/2014 1025   K 4.4 08/29/2014 1051   K 4.1 06/14/2014 1025   CL 100 06/14/2014 1025   CO2 27 08/29/2014 1051   CO2 33* 06/14/2014 1025   GLUCOSE 84 08/29/2014 1051   GLUCOSE 79 06/14/2014 1025   GLUCOSE 88 02/06/2006 1059   BUN 21.6 08/29/2014 1051   BUN 16 06/14/2014 1025   CREATININE 1.0 08/29/2014 1051   CREATININE 0.97 06/14/2014 1025   CALCIUM 10.1 08/29/2014 1051   CALCIUM 9.9 06/14/2014 1025   PROT 7.4 08/29/2014 1051   PROT 8.5* 12/21/2013 1004   ALBUMIN 4.1 08/29/2014 1051   ALBUMIN 4.2 12/21/2013 1004   AST 23 08/29/2014 1051   AST 24 12/21/2013 1004   ALT 37 08/29/2014 1051   ALT 19 12/21/2013 1004   ALKPHOS 98 08/29/2014 1051   ALKPHOS 64 12/21/2013 1004   BILITOT 0.92 08/29/2014 1051   BILITOT 0.8 12/21/2013 1004   GFRNONAA 56* 06/14/2014 1025   GFRAA 65* 06/14/2014 1025    INo results found for: SPEP, UPEP  Lab Results  Component Value Date   WBC 9.4 08/29/2014   NEUTROABS 5.5 08/29/2014   HGB 14.7 08/29/2014   HCT 43.0 08/29/2014   MCV 92.9 08/29/2014   PLT 160 08/29/2014      Chemistry      Component Value Date/Time   NA 133* 08/29/2014 1051   NA 138 06/14/2014 1025   K 4.4 08/29/2014 1051   K 4.1 06/14/2014 1025   CL 100 06/14/2014 1025   CO2 27 08/29/2014 1051   CO2 33* 06/14/2014 1025   BUN 21.6 08/29/2014 1051   BUN 16 06/14/2014 1025   CREATININE 1.0 08/29/2014 1051   CREATININE 0.97  06/14/2014 1025      Component Value Date/Time   CALCIUM 10.1 08/29/2014 1051   CALCIUM 9.9 06/14/2014 1025   ALKPHOS 98 08/29/2014 1051   ALKPHOS 64 12/21/2013 1004   AST 23 08/29/2014 1051   AST 24 12/21/2013 1004   ALT 37 08/29/2014 1051   ALT 19 12/21/2013 1004   BILITOT 0.92 08/29/2014 1051   BILITOT 0.8 12/21/2013 1004       No results found for: LABCA2  No components found for: LABCA125  No results for input(s): INR in  the last 168 hours.  Urinalysis    Component Value Date/Time   COLORURINE YELLOW 05/17/2013 1330   APPEARANCEUR CLEAR 05/17/2013 1330   LABSPEC 1.005 08/09/2014 1052   LABSPEC 1.025 05/17/2013 1330   PHURINE 7.0 08/09/2014 1052   PHURINE 5.5 05/17/2013 1330   GLUCOSEU Negative 08/09/2014 1052   GLUCOSEU NEGATIVE 05/17/2013 1330   HGBUR Negative 08/09/2014 1052   HGBUR NEGATIVE 05/17/2013 1330   BILIRUBINUR Negative 08/09/2014 1052   BILIRUBINUR n 06/21/2013 1115   BILIRUBINUR NEGATIVE 05/17/2013 1330   KETONESUR Negative 08/09/2014 1052   KETONESUR NEGATIVE 05/17/2013 1330   PROTEINUR Negative 08/09/2014 1052   PROTEINUR n 06/21/2013 1115   PROTEINUR NEGATIVE 05/17/2013 1330   UROBILINOGEN 0.2 08/09/2014 1052   UROBILINOGEN 0.2 06/21/2013 1115   UROBILINOGEN 0.2 05/17/2013 1330   NITRITE Negative 08/09/2014 1052   NITRITE n 06/21/2013 1115   NITRITE NEGATIVE 05/17/2013 1330   LEUKOCYTESUR Negative 08/09/2014 1052   LEUKOCYTESUR Trace 06/21/2013 1115    STUDIES: No results found.  ASSESSMENT: 74 y.o. [place] woman  (1) status post left modified radical mastectomy November 1998 for a pT2 pN0, stage IIA invasive ductal carcinoma, grade 2, estrogen and progesterone receptor positive, HER-2 not amplified  (a) adjuvant chemotherapy consisted of doxorubicin and cyclophosphamide 4  (b) adjuvant anti-estrogens consisted of tamoxifen, then letrozole, for a total of 5 years  (2) status post left breast lower outer quadrant biopsy 04/07/2014 for a clinical T1c N0,stage IA  invasive ductal carcinoma, with squamous features, estrogen and progesterone receptor negative, HER-2 not amplified, with an MIB-1 of 10%   (3) left lumpectomy and sentinel lymph node biopsy 05/05/2014 showed aT1c pN0, stage IA invasive ductal carcinoma, grade 1, triple negative, with close but negative margins.   (4) Mammaprint classifies the tumor as basal like, high risk, and predicts a 30% chance of  recurrence within 10 years with local treatment only.   (5) start of treatment delayed because of bilateral pneumothoraces after port placement. adjuvant chemotherapy starting 07/11/2014 to consist of carboplatin and docetaxel given every 21 days 4 with Neulasta support.  (6) genetics testing March 2016 showed no deleterious mutation in the [employer] panel  [[facility] [employer]] including sequencing and rearrangement analysis for the following 24 genes:ATM, BARD1, BRCA1, BRCA2, BRIP1, CDH1, CHEK2, EPCAM, MLH1, MRE11A, MSH2, MSH6, MUTYH, NBN, NF1, PALB2, PMS2, PTEN, RAD50, RAD51C, RAD51D, SMARCA4, STK11, and TP53.   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    PLAN: Laura Li had a rough week, but is somewhat better today. The labs were reviewed in detail and were entirely stable. She is scheduled for IV fluids today, which should help with her dehydration and fatigue.   For diarrhea I have sent in a prescription for questran powder, and I have instructed her on how to use it. I have also sent in a prescription for nystatin suspension for her thrush.  I  have placed a referral to Dr. Unknown Jim office, as she will be proceeding to radiation soon.   Laura Li will return next week for her 4th and final cycle of carboplatin and docetaxel. Laura Li understands and agrees with this plan. She knows the goal of treatment in her case is cure. She has been encouraged to call with any issues that might arise before her next visit here.    Laura Panda, NP   08/29/2014 1:03 PM

## 2014-09-01 ENCOUNTER — Telehealth: Payer: Self-pay | Admitting: Family Medicine

## 2014-09-01 DIAGNOSIS — J309 Allergic rhinitis, unspecified: Secondary | ICD-10-CM

## 2014-09-01 MED ORDER — IPRATROPIUM BROMIDE 0.03 % NA SOLN: 2.0000 | Freq: Two times a day (BID) | NASAL | Status: DC

## 2014-09-01 NOTE — Telephone Encounter (Signed)
Pt request refill ipratropium (ATROVENT) 0.03 % nasal spray 90 day  primemail mailorder

## 2014-09-01 NOTE — Telephone Encounter (Signed)
Medication sent in. 

## 2014-09-05 ENCOUNTER — Other Ambulatory Visit: Payer: Self-pay

## 2014-09-13 ENCOUNTER — Ambulatory Visit (HOSPITAL_BASED_OUTPATIENT_CLINIC_OR_DEPARTMENT_OTHER): Payer: Medicare Other | Admitting: Nurse Practitioner

## 2014-09-13 ENCOUNTER — Ambulatory Visit: Payer: Medicare Other | Admitting: Nutrition

## 2014-09-13 ENCOUNTER — Ambulatory Visit (HOSPITAL_BASED_OUTPATIENT_CLINIC_OR_DEPARTMENT_OTHER): Payer: Medicare Other

## 2014-09-13 ENCOUNTER — Encounter: Payer: Self-pay | Admitting: *Deleted

## 2014-09-13 ENCOUNTER — Telehealth: Payer: Self-pay | Admitting: *Deleted

## 2014-09-13 ENCOUNTER — Encounter: Payer: Self-pay | Admitting: Nurse Practitioner

## 2014-09-13 ENCOUNTER — Other Ambulatory Visit (HOSPITAL_BASED_OUTPATIENT_CLINIC_OR_DEPARTMENT_OTHER): Payer: Medicare Other

## 2014-09-13 VITALS — BP 125/54 | HR 90 | Temp 97.8°F | Resp 18 | Ht 66.0 in | Wt 142.9 lb

## 2014-09-13 DIAGNOSIS — Z5111 Encounter for antineoplastic chemotherapy: Secondary | ICD-10-CM

## 2014-09-13 DIAGNOSIS — C50912 Malignant neoplasm of unspecified site of left female breast: Secondary | ICD-10-CM

## 2014-09-13 DIAGNOSIS — C50812 Malignant neoplasm of overlapping sites of left female breast: Secondary | ICD-10-CM | POA: Diagnosis not present

## 2014-09-13 DIAGNOSIS — C50911 Malignant neoplasm of unspecified site of right female breast: Secondary | ICD-10-CM

## 2014-09-13 LAB — CBC WITH DIFFERENTIAL/PLATELET
BASO%: 0.2 % (ref 0.0–2.0)
Basophils Absolute: 0 10*3/uL (ref 0.0–0.1)
EOS%: 0.1 % (ref 0.0–7.0)
Eosinophils Absolute: 0 10*3/uL (ref 0.0–0.5)
HCT: 31.5 % — ABNORMAL LOW (ref 34.8–46.6)
HEMOGLOBIN: 10.6 g/dL — AB (ref 11.6–15.9)
LYMPH%: 14.1 % (ref 14.0–49.7)
MCH: 31.5 pg (ref 25.1–34.0)
MCHC: 33.7 g/dL (ref 31.5–36.0)
MCV: 93.6 fL (ref 79.5–101.0)
MONO#: 0.5 10*3/uL (ref 0.1–0.9)
MONO%: 3.6 % (ref 0.0–14.0)
NEUT%: 82 % — ABNORMAL HIGH (ref 38.4–76.8)
NEUTROS ABS: 12.4 10*3/uL — AB (ref 1.5–6.5)
Platelets: 174 10*3/uL (ref 145–400)
RBC: 3.36 10*6/uL — ABNORMAL LOW (ref 3.70–5.45)
RDW: 18.2 % — ABNORMAL HIGH (ref 11.2–14.5)
WBC: 15.1 10*3/uL — AB (ref 3.9–10.3)
lymph#: 2.1 10*3/uL (ref 0.9–3.3)

## 2014-09-13 LAB — COMPREHENSIVE METABOLIC PANEL (CC13)
ALBUMIN: 4 g/dL (ref 3.5–5.0)
ALK PHOS: 94 U/L (ref 40–150)
ALT: 19 U/L (ref 0–55)
ANION GAP: 11 meq/L (ref 3–11)
AST: 20 U/L (ref 5–34)
BUN: 17.2 mg/dL (ref 7.0–26.0)
CHLORIDE: 101 meq/L (ref 98–109)
CO2: 25 mEq/L (ref 22–29)
Calcium: 10.5 mg/dL — ABNORMAL HIGH (ref 8.4–10.4)
Creatinine: 1 mg/dL (ref 0.6–1.1)
EGFR: 55 mL/min/{1.73_m2} — AB (ref >90)
GLUCOSE: 125 mg/dL (ref 70–140)
Potassium: 3.9 mEq/L (ref 3.5–5.1)
Sodium: 138 mEq/L (ref 136–145)
Total Bilirubin: 0.47 mg/dL (ref 0.20–1.20)
Total Protein: 7.2 g/dL (ref 6.4–8.3)

## 2014-09-13 MED ORDER — HEPARIN SOD (PORK) LOCK FLUSH 100 UNIT/ML IV SOLN
500.0000 [IU] | Freq: Once | INTRAVENOUS | Status: AC | PRN
  Administered 2014-09-13: 500 [IU]
  Filled 2014-09-13: qty 5

## 2014-09-13 MED ORDER — SODIUM CHLORIDE 0.9 % IV SOLN
Freq: Once | INTRAVENOUS | Status: AC
  Administered 2014-09-13: 11:00:00 via INTRAVENOUS

## 2014-09-13 MED ORDER — DEXAMETHASONE 4 MG PO TABS: 8.0000 mg | ORAL_TABLET | Freq: Two times a day (BID) | ORAL | Status: DC

## 2014-09-13 MED ORDER — SODIUM CHLORIDE 0.9 % IJ SOLN
10.0000 mL | INTRAMUSCULAR | Status: DC | PRN
  Administered 2014-09-13: 10 mL
  Filled 2014-09-13: qty 10

## 2014-09-13 MED ORDER — SODIUM CHLORIDE 0.9 % IV SOLN
Freq: Once | INTRAVENOUS | Status: AC
  Administered 2014-09-13: 11:00:00 via INTRAVENOUS
  Filled 2014-09-13: qty 8

## 2014-09-13 MED ORDER — SODIUM CHLORIDE 0.9 % IV SOLN
380.0000 mg | Freq: Once | INTRAVENOUS | Status: AC
  Administered 2014-09-13: 380 mg via INTRAVENOUS
  Filled 2014-09-13: qty 38

## 2014-09-13 MED ORDER — DEXTROSE 5 % IV SOLN
75.0000 mg/m2 | Freq: Once | INTRAVENOUS | Status: AC
  Administered 2014-09-13: 130 mg via INTRAVENOUS
  Filled 2014-09-13: qty 13

## 2014-09-13 NOTE — Progress Notes (Signed)
Nutrition follow-up completed with patient receiving her final chemotherapy for triple negative breast cancer.  Weight improved documented as 142.9 pounds July 5 increased from 136.5 pounds June 20 Patient's appetite has improved over the past week and she has been able to increase oral intake. Patient continues to have metallic taste after chemotherapy. Patient has stopped drinking Carnation instant breakfast secondary to diarrhea. Patient denies problems with constipation or diarrhea this time.  Nutrition diagnosis: Unintended weight loss has improved.  Intervention: Patient educated to continue strategies for adequate calories and protein to promote weight maintenance. Enforced continued strategies for improving metallic taste. Educated patient to try lactate milk or ice cream if desired. Questions were answered.  Teach back method was used.  Patient encouraged to contact me with any questions.  Monitoring, evaluation, goals: Patient will tolerate adequate calories and protein to promote maintenance of lean body mass.  No follow-up has been scheduled.  Patient has my contact information.

## 2014-09-13 NOTE — Patient Instructions (Signed)
[facility] Discharge Instructions for Patients Receiving Chemotherapy  Today you received the following chemotherapy agents: Taxotere, carboplatin  To help prevent nausea and vomiting after your treatment, we encourage you to take your nausea medication as prescribed.  If you develop nausea and vomiting that is not controlled by your nausea medication, call the clinic.   BELOW ARE SYMPTOMS THAT SHOULD BE REPORTED IMMEDIATELY:  *FEVER GREATER THAN 100.5 F  *CHILLS WITH OR WITHOUT FEVER  NAUSEA AND VOMITING THAT IS NOT CONTROLLED WITH YOUR NAUSEA MEDICATION  *UNUSUAL SHORTNESS OF BREATH  *UNUSUAL BRUISING OR BLEEDING  TENDERNESS IN MOUTH AND THROAT WITH OR WITHOUT PRESENCE OF ULCERS  *URINARY PROBLEMS  *BOWEL PROBLEMS  UNUSUAL RASH Items with * indicate a potential emergency and should be followed up as soon as possible.  Feel free to call the clinic you have any questions or concerns. The clinic phone number is [phone].  Please show the [facility] at check-in to the Emergency Department and triage nurse.

## 2014-09-13 NOTE — Telephone Encounter (Signed)
Per staff message and POF I have scheduled appts. Advised scheduler of appts. JMW  

## 2014-09-13 NOTE — Progress Notes (Signed)
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 Non-of the 25 lymph nodes removed from the right axilla were involved. The tumor was estrogen and progesterone receptor positive, HER-2 not amplified. She was treated adjuvantly with doxorubicin and cyclophosphamide 4, then received 5 years of antiestrogen therapy (initially tamoxifen, later letrozole). There has been no evidence of disease recurrence on the right.  More recently, Leontyne had routine left screening mammography [facility] March 08 2014. This showed a suspicious area of architectural distortion in the inferior portion of the left breast. On 03/29/2014 Marya underwent left diagnostic mammography with ultrasonography. Spot compression views confirmed the presence 7 area of distortion in the lower central left breast, which was not palpable by physical exam (there was a prior biopsy scar in the upper outer quadrant of the left breast). Ultrasound confirmed a hypoechoic spiculated mass at the 6:00 location in the left breast, measuring maximally 1.3 cm. Sonography of the left axilla was negative.  Biopsy of the left breast mass in question 120 10/29/2014 showed ([id number]) an invasive breast cancer with squamous features. It was estrogen and progesterone receptor negative. There was no HER-2 amplification, the signals ratio being 0.82 and the number per cell 1.80. The  MIB-1 was 10%.  The patient has met with Dr. Dwain Sarna in surgery and Dr. Michell Heinrich in radiation oncology. The patient understands there is no survival difference between lumpectomy and radiation compared with mastectomy. The patient will need sentinel lymph node sampling with either of those procedures. If she does have a lumpectomy, she will benefit from adjuvant radiation. The patient has been set up for genetics testing and this may affect her ultimate surgical decision.  Her subsequent history is as detailed below  INTERVAL HISTORY: Miquel returns today for follow-up of her breast cancer, accompanied by her daughter, Selena Batten. Today is day 1 cycle 4 of 4 planned cycles of cyclophosphamide and docetaxel, given every 21 days, with Neulasta on day 2.   REVIEW OF SYSTEMS: Takima is much improved this week. The IV fluids given on day 8 have helped. She denies fevers, chills, nausea, or vomiting. She has some diarrhea, but imodium alone has helped this. Her appetite is improved and she is gaining weight. She denies mouth sores, rashes, or neuropathy symptoms. Her eyes have been tearing excessively, and she has stopped using the restasis. A detailed review of systems is otherwise stable.  PAST MEDICAL HISTORY: Past Medical History  Diagnosis Date  . OA (osteoarthritis)   . History of DVT (deep vein thrombosis)   . Hyperlipidemia   . Hypertension   . Hypothyroidism   . HX: breast cancer     melanoma  . Melanoma   . Eczema   . Arthritis   . Esophageal stricture   . GERD (gastroesophageal reflux disease)   . PONV (postoperative nausea and vomiting)   . Wears glasses   . Depression     states no depression  . Chronic kidney disease  some renal impairment from meds  . Headache   . Anemia     from medicaions  . Heart murmur     states it is benign told 30-40 years ago  . Pneumothorax on right 06/15/2014    PAST SURGICAL HISTORY: Past Surgical History  Procedure Laterality Date  .  Appendectomy  1967  . Melanoma excision  2004    right side of face  . 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 Total abdominal hysterectomy  1992    including ovaries  . Tonsillectomy and adenoidectomy  1949  . Lipoma excision  1978    right breast  . Mastectomy  1998    right-nodes out  . Breast lumpectomy      left breast-benign  . Breast surgery  1977    removal of calcified milk gland right breast  . Colonoscopy    . Radioactive seed guided mastectomy with axillary sentinel lymph node biopsy Left 05/05/2014    Procedure: RADIOACTIVE SEED GUIDED LEFT LUMPECTOMY WITH AXILLARY SENTINEL LYMPH NODE BIOPSY;  Surgeon: Rolm Bookbinder, MD;  Location: [facility];  Service: General;  Laterality: Left;  . Chest tube insertion  06/15/2014  . Portacath placement Left 06/15/2014    Procedure: INSERTION PORT-A-CATH;  Surgeon: Rolm Bookbinder, MD;  Location: [place];  Service: General;  Laterality: Left;  . Chest tube insertion Bilateral 06/15/2014    Procedure: CHEST TUBE INSERTION;  Surgeon: Rolm Bookbinder, MD;  Location: [place];  Service: General;  Laterality: Bilateral;    FAMILY HISTORY Family History  Problem Relation Age of Onset  . Heart disease Father 35    smoker  . Endometrial cancer Mother 92  . Breast cancer Cousin     x 3 cousins with breast cancer (one at 29, one at 40 and one at 10)  . Lung cancer Paternal Aunt     smoker  . Brain cancer Maternal Aunt   . Colon cancer Neg Hx   . Diabetes Maternal Aunt     ???  . Breast cancer Maternal Aunt 51  The patient's father died at the age of 52 from a myocardial infarction. The patient's mother died at the age of 37 from metastatic endometrial cancer which had been diagnosed 2 years before. The patient had no brothers, one sister. The patient's mother's sister was diagnosed with breast cancer but the patient does not know at what age. A second maternal aunt was diagnosed with brain cancer. The patient has 3  cousins on her mother's side diagnosed with breast cancer, one of them under the age of 90. On the father's side there is a history of lung and cervical cancer  GYNECOLOGIC HISTORY:  No LMP recorded. Patient has had a hysterectomy. Menarche age 10, first live birth age 30. The patient is GX P1. She underwent hysterectomy with bilateral salpingo-oophorectomy in 1991. She used hormone replacement for approximately 7 years until 1998, when she had her right-sided breast cancer. She used oral contraceptives for approximately one year with no complications. 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 She is widowed and lives alone with no pets. Her main hobby is painting. Her daughter Faith Rogue lives in [place] and is also a homemaker. The patient has 2 grandchildren. She attends [employer]  DIRECTIVES: In place. The patient's daughter [name] is her healthcare power of attorney. [name] can be reached at [phone]   HEALTH MAINTENANCE: History  Substance Use Topics  . Smoking status: Former Smoker -- 0.75 packs/day for 3 years    Types: Cigarettes    Quit date: 03/11/1969  . Smokeless tobacco: Never Used  . Alcohol Use: 0.0 oz/week    0 Standard drinks or equivalent per week     Comment: occasional wine     Colonoscopy: 20/10/John Henrene Pastor  [facility]N:[id number] post hysterectomy  Bone density:2014/osteopenia  Lipid panel:  Allergies  Allergen Reactions  . Dyazide [Hydrochlorothiazide W-Triamterene]     Presumption: drug induced vasculitis  . Niacin Other (See Comments)    flushing  . Penicillins Diarrhea and Nausea And Vomiting    Current Outpatient Prescriptions  Medication Sig Dispense Refill  . atorvastatin (LIPITOR) 20 MG tablet Take 1 tablet (20 mg total) by mouth daily. (Patient taking differently: Take 10 mg  by mouth daily. ) 90 tablet 3  . Calcium-Vitamin D (CALTRATE 600 PLUS-VIT D PO) Take 1 tablet by mouth 2 (two) times daily.     [name] dexamethasone (DECADRON) 4 MG tablet Take 2 tablets (8 mg total) by mouth 2 (two) times daily. Start the day before Taxotere. Then again the day after chemo for 3 days. 6 tablet 1  . esomeprazole (NEXIUM) 40 MG capsule TAKE 1 CAPSULE DAILY (Patient taking differently: Take 40 mg by mouth daily. TAKE 1 CAPSULE DAILY) 90 capsule 3  . ipratropium (ATROVENT) 0.03 % nasal spray Place 2 sprays into the nose every 12 (twelve) hours. 180 mL 4  . Lactobacillus-Inulin (CULTURELLE DIGESTIVE HEALTH) CHEW Chew 1 capsule by mouth daily.     [name] levothyroxine (SYNTHROID, LEVOTHROID) 75 MCG tablet Take 1 tablet (75 mcg total) by mouth daily. 90 tablet 2  . lidocaine-prilocaine (EMLA) cream Apply to affected area once 30 g 3  . nadolol (CORGARD) 20 MG tablet Take 0.5 tablets (10 mg total) by mouth daily. 90 tablet 1  . nystatin (MYCOSTATIN) 100000 UNIT/ML suspension Take 5 mLs (500,000 Units total) by mouth 4 (four) times daily. 240 mL 0  . venlafaxine XR (EFFEXOR-XR) 150 MG 24 hr capsule Take 1 capsule (150 mg total) by mouth daily. 180 capsule 0  . acetaminophen (TYLENOL) 500 MG tablet Take 500 mg by mouth 2 (two) times daily as needed for mild pain.     . Carboxymethylcellulose Sodium (REFRESH LIQUIGEL OP) Apply 1 drop to eye at bedtime as needed (dry eyes).    . cholestyramine (QUESTRAN) 4 G packet Take 1 packet (4 g total) by mouth 3 (three) times daily with meals. (Patient not taking: Reported on 09/13/2014) 60 each 1  . cycloSPORINE (RESTASIS) 0.05 % ophthalmic emulsion Place 1 drop into both eyes 2 (two) times daily. (Patient not taking: Reported on 09/13/2014) 30 mL 3  . docusate sodium (COLACE) 100 MG capsule Take 1 capsule (100 mg total) by mouth 2 (two) times daily. (Patient not taking: Reported on 08/22/2014) 40 capsule 0  . fexofenadine (ALLEGRA) 180 MG tablet Take 180 mg by mouth  daily as needed for allergies.     [name] LORazepam (ATIVAN) 0.5 MG tablet 1/2 to 1 tab po q 8 hours prn (Patient not taking: Reported on 08/29/2014) 30 tablet 1  . Polyethyl Glycol-Propyl Glycol (SYSTANE) 0.4-0.3 % SOLN Apply 1 drop to eye daily as needed (for dryness/itching).     . prochlorperazine (COMPAZINE) 10 MG tablet Take 1 tablet (10  mg total) by mouth every 6 (six) hours as needed (Nausea or vomiting). (Patient not taking: Reported on 08/29/2014) 30 tablet 1  . traMADol (ULTRAM) 50 MG tablet TAKE 1 TABLET BY MOUTH EVERY 6 HOURS AS NEEDED FOR MODERATE/SEVERE PAIN (Patient not taking: Reported on 08/09/2014) 20 tablet 0   No current facility-administered medications for this visit.    OBJECTIVE: Older white womanin who appears younger than stated age 35 Vitals:   09/13/14 0924  BP: 125/54  Pulse: 90  Temp: 97.8 F (36.6 C)  Resp: 18     Body mass index is 23.08 kg/(m^2).    ECOG FS:1 - Symptomatic but completely ambulatory  Sclerae unicteric, pupils round and equal Oropharynx clear and moist-- no thrush or other lesions No cervical or supraclavicular adenopathy Lungs no rales or rhonchi Heart regular rate and rhythm Abd soft, nontender, positive bowel sounds MSK no focal spinal tenderness, no upper extremity lymphedema Neuro: nonfocal, well oriented, appropriate affect Breasts: deferred  LAB RESULTS:  CMP     Component Value Date/Time   NA 138 09/13/2014 0903   NA 138 06/14/2014 1025   K 3.9 09/13/2014 0903   K 4.1 06/14/2014 1025   CL 100 06/14/2014 1025   CO2 25 09/13/2014 0903   CO2 33* 06/14/2014 1025   GLUCOSE 125 09/13/2014 0903   GLUCOSE 79 06/14/2014 1025   GLUCOSE 88 02/06/2006 1059   BUN 17.2 09/13/2014 0903   BUN 16 06/14/2014 1025   CREATININE 1.0 09/13/2014 0903   CREATININE 0.97 06/14/2014 1025   CALCIUM 10.5* 09/13/2014 0903   CALCIUM 9.9 06/14/2014 1025   PROT 7.2 09/13/2014 0903   PROT 8.5* 12/21/2013 1004   ALBUMIN 4.0 09/13/2014 0903    ALBUMIN 4.2 12/21/2013 1004   AST 20 09/13/2014 0903   AST 24 12/21/2013 1004   ALT 19 09/13/2014 0903   ALT 19 12/21/2013 1004   ALKPHOS 94 09/13/2014 0903   ALKPHOS 64 12/21/2013 1004   BILITOT 0.47 09/13/2014 0903   BILITOT 0.8 12/21/2013 1004   GFRNONAA 56* 06/14/2014 1025   GFRAA 65* 06/14/2014 1025    INo results found for: SPEP, UPEP  Lab Results  Component Value Date   WBC 15.1* 09/13/2014   NEUTROABS 12.4* 09/13/2014   HGB 10.6* 09/13/2014   HCT 31.5* 09/13/2014   MCV 93.6 09/13/2014   PLT 174 09/13/2014      Chemistry      Component Value Date/Time   NA 138 09/13/2014 0903   NA 138 06/14/2014 1025   K 3.9 09/13/2014 0903   K 4.1 06/14/2014 1025   CL 100 06/14/2014 1025   CO2 25 09/13/2014 0903   CO2 33* 06/14/2014 1025   BUN 17.2 09/13/2014 0903   BUN 16 06/14/2014 1025   CREATININE 1.0 09/13/2014 0903   CREATININE 0.97 06/14/2014 1025      Component Value Date/Time   CALCIUM 10.5* 09/13/2014 0903   CALCIUM 9.9 06/14/2014 1025   ALKPHOS 94 09/13/2014 0903   ALKPHOS 64 12/21/2013 1004   AST 20 09/13/2014 0903   AST 24 12/21/2013 1004   ALT 19 09/13/2014 0903   ALT 19 12/21/2013 1004   BILITOT 0.47 09/13/2014 0903   BILITOT 0.8 12/21/2013 1004       No results found for: LABCA2  No components found for: LABCA125  No results for input(s): INR in the last 168 hours.  Urinalysis    Component Value Date/Time   COLORURINE YELLOW 05/17/2013 1330   APPEARANCEUR  CLEAR 05/17/2013 1330   LABSPEC 1.005 08/09/2014 1052   LABSPEC 1.025 05/17/2013 1330   PHURINE 7.0 08/09/2014 1052   PHURINE 5.5 05/17/2013 1330   GLUCOSEU Negative 08/09/2014 1052   GLUCOSEU NEGATIVE 05/17/2013 1330   HGBUR Negative 08/09/2014 1052   HGBUR NEGATIVE 05/17/2013 1330   BILIRUBINUR Negative 08/09/2014 1052   BILIRUBINUR n 06/21/2013 1115   BILIRUBINUR NEGATIVE 05/17/2013 1330   KETONESUR Negative 08/09/2014 1052   KETONESUR NEGATIVE 05/17/2013 1330   PROTEINUR  Negative 08/09/2014 1052   PROTEINUR n 06/21/2013 1115   PROTEINUR NEGATIVE 05/17/2013 1330   UROBILINOGEN 0.2 08/09/2014 1052   UROBILINOGEN 0.2 06/21/2013 1115   UROBILINOGEN 0.2 05/17/2013 1330   NITRITE Negative 08/09/2014 1052   NITRITE n 06/21/2013 1115   NITRITE NEGATIVE 05/17/2013 1330   LEUKOCYTESUR Negative 08/09/2014 1052   LEUKOCYTESUR Trace 06/21/2013 1115    STUDIES: No results found.  ASSESSMENT: 74 y.o. [place] woman  (1) status post left modified radical mastectomy November 1998 for a pT2 pN0, stage IIA invasive ductal carcinoma, grade 2, estrogen and progesterone receptor positive, HER-2 not amplified  (a) adjuvant chemotherapy consisted of doxorubicin and cyclophosphamide 4  (b) adjuvant anti-estrogens consisted of tamoxifen, then letrozole, for a total of 5 years  (2) status post left breast lower outer quadrant biopsy 04/07/2014 for a clinical T1c N0,stage IA  invasive ductal carcinoma, with squamous features, estrogen and progesterone receptor negative, HER-2 not amplified, with an MIB-1 of 10%   (3) left lumpectomy and sentinel lymph node biopsy 05/05/2014 showed aT1c pN0, stage IA invasive ductal carcinoma, grade 1, triple negative, with close but negative margins.   (4) Mammaprint classifies the tumor as basal like, high risk, and predicts a 30% chance of recurrence within 10 years with local treatment only.   (5) start of treatment delayed because of bilateral pneumothoraces after port placement. adjuvant chemotherapy starting 07/11/2014 to consist of carboplatin and docetaxel given every 21 days 4 with Neulasta support.  (6) genetics testing March 2016 showed no deleterious mutation in the [employer] panel  [[employer]] including sequencing and rearrangement analysis for the following 24 genes:ATM, BARD1, BRCA1, BRCA2, BRIP1, CDH1, CHEK2, EPCAM, MLH1, MRE11A, MSH2, MSH6, MUTYH, NBN, NF1, PALB2, PMS2, PTEN, RAD50, RAD51C, RAD51D, SMARCA4, STK11, and TP53.     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    PLAN: Makenzee looks and feels well today. The labs were reviewed in detail and were entirely stable. She will proceed with her 4th and final cycle of carboplatin and docetaxel as planned today.   Gissele will meet with Dr. Pablo Ledger to discuss radiation next week. She will also have IV fluids and a nadir visit on that day. She understands and agrees with this plan. She knows the goal of treatment in her case is cure. She has been encouraged to call with any issues that might arise before her next visit here.   Laurie Panda, NP   09/13/2014 10:15 AM

## 2014-09-14 ENCOUNTER — Telehealth: Payer: Self-pay | Admitting: Nurse Practitioner

## 2014-09-14 ENCOUNTER — Telehealth: Payer: Self-pay | Admitting: *Deleted

## 2014-09-14 ENCOUNTER — Ambulatory Visit (HOSPITAL_BASED_OUTPATIENT_CLINIC_OR_DEPARTMENT_OTHER): Payer: Medicare Other

## 2014-09-14 VITALS — BP 114/55 | HR 79 | Temp 97.9°F

## 2014-09-14 DIAGNOSIS — C50912 Malignant neoplasm of unspecified site of left female breast: Secondary | ICD-10-CM

## 2014-09-14 DIAGNOSIS — C50911 Malignant neoplasm of unspecified site of right female breast: Secondary | ICD-10-CM

## 2014-09-14 DIAGNOSIS — C50512 Malignant neoplasm of lower-outer quadrant of left female breast: Secondary | ICD-10-CM

## 2014-09-14 DIAGNOSIS — Z5189 Encounter for other specified aftercare: Secondary | ICD-10-CM

## 2014-09-14 MED ORDER — PEGFILGRASTIM INJECTION 6 MG/0.6ML ~~LOC~~
6.0000 mg | PREFILLED_SYRINGE | Freq: Once | SUBCUTANEOUS | Status: AC
  Administered 2014-09-14: 6 mg via SUBCUTANEOUS
  Filled 2014-09-14: qty 0.6

## 2014-09-14 NOTE — Telephone Encounter (Signed)
Left message to confirm appointment change

## 2014-09-14 NOTE — Telephone Encounter (Signed)
Per staff message and POF I have scheduled appts. Advised scheduler of appts. JMW  

## 2014-09-19 ENCOUNTER — Ambulatory Visit: Payer: Medicare Other | Admitting: Nurse Practitioner

## 2014-09-19 ENCOUNTER — Other Ambulatory Visit: Payer: Medicare Other

## 2014-09-19 NOTE — Progress Notes (Signed)
04/07/2014 Diagnosis Breast, left, needle core biopsy, mass, 6 o'clock - INVASIVE CARCINOMA.  Receptor Status: ER 0 %(-), PR 0% (-), Her2-neu (-)/Triple negative  Did patient present with symptoms (if so, please note symptoms) or was this found on screening mammography?:  Past/Anticipated interventions by surgeon, if any:05/05/14:RADIOACTIVE SEED GUIDED PARTIAL MASTECTOMY WITH AXILLARY SENTINEL LYMPH NODE BIOPSY  Past/Anticipated interventions by medical oncology, if any: Chemotherapy  Chemotherapy for right breast cancer ER+PR+ Her 2 neg.in 1998 included AC x 4, tamoxifen was d/c'd and femara x5 years  Lymphedema issues, if VXB:OZWR  Pain issues, if any: None  SAFETY ISSUES:  Prior radiation? NO  Pacemaker/ICD? NO  Possible current pregnancy?No post-menopausal  Is the patient on methotrexate? NO  Current Complaints / other details:Menarche 68, gravida 1,child at age 46, menopause between [age].used oral contraceptives less than 5 years.  HRT ~ 7 years.  Hysterectomy Former smoker, 2 years -1 cigarette to 1pp day cigarettes, /Occasional alcohol.no smokeless tobacco Modified radical mastectomy and lymph nodes November 1998, then chemotherapy 4-5 weeks later till end of January 1999 in [place], [place].   Allergies;niacin, dyazide and penicillin and antihyperlipidemics

## 2014-09-21 ENCOUNTER — Other Ambulatory Visit (HOSPITAL_BASED_OUTPATIENT_CLINIC_OR_DEPARTMENT_OTHER): Payer: Medicare Other

## 2014-09-21 ENCOUNTER — Ambulatory Visit
Admission: RE | Admit: 2014-09-21 | Discharge: 2014-09-21 | Disposition: A | Discharge status: Home / Self Care | Payer: Medicare Other | Source: Ambulatory Visit | Attending: Radiation Oncology | Admitting: Radiation Oncology

## 2014-09-21 ENCOUNTER — Ambulatory Visit (HOSPITAL_BASED_OUTPATIENT_CLINIC_OR_DEPARTMENT_OTHER): Payer: Medicare Other | Admitting: Nurse Practitioner

## 2014-09-21 ENCOUNTER — Ambulatory Visit (HOSPITAL_BASED_OUTPATIENT_CLINIC_OR_DEPARTMENT_OTHER): Payer: Medicare Other

## 2014-09-21 ENCOUNTER — Encounter: Payer: Self-pay | Admitting: Nurse Practitioner

## 2014-09-21 ENCOUNTER — Encounter: Payer: Self-pay | Admitting: Radiation Oncology

## 2014-09-21 VITALS — BP 139/59 | HR 77 | Temp 97.8°F | Ht 66.0 in | Wt 139.4 lb

## 2014-09-21 VITALS — BP 151/70 | HR 70 | Temp 97.7°F | Resp 18

## 2014-09-21 VITALS — BP 142/66 | HR 87 | Temp 97.7°F | Resp 18

## 2014-09-21 DIAGNOSIS — Z9011 Acquired absence of right breast and nipple: Secondary | ICD-10-CM | POA: Insufficient documentation

## 2014-09-21 DIAGNOSIS — C50912 Malignant neoplasm of unspecified site of left female breast: Principal | ICD-10-CM

## 2014-09-21 DIAGNOSIS — Z452 Encounter for adjustment and management of vascular access device: Secondary | ICD-10-CM

## 2014-09-21 DIAGNOSIS — Z853 Personal history of malignant neoplasm of breast: Secondary | ICD-10-CM | POA: Insufficient documentation

## 2014-09-21 DIAGNOSIS — Z9221 Personal history of antineoplastic chemotherapy: Secondary | ICD-10-CM | POA: Insufficient documentation

## 2014-09-21 DIAGNOSIS — C50512 Malignant neoplasm of lower-outer quadrant of left female breast: Secondary | ICD-10-CM | POA: Insufficient documentation

## 2014-09-21 DIAGNOSIS — C50812 Malignant neoplasm of overlapping sites of left female breast: Secondary | ICD-10-CM

## 2014-09-21 DIAGNOSIS — Z808 Family history of malignant neoplasm of other organs or systems: Secondary | ICD-10-CM | POA: Diagnosis not present

## 2014-09-21 DIAGNOSIS — R5383 Other fatigue: Secondary | ICD-10-CM | POA: Diagnosis not present

## 2014-09-21 DIAGNOSIS — Z803 Family history of malignant neoplasm of breast: Secondary | ICD-10-CM | POA: Diagnosis not present

## 2014-09-21 DIAGNOSIS — H04203 Unspecified epiphora, bilateral lacrimal glands: Secondary | ICD-10-CM

## 2014-09-21 DIAGNOSIS — C50911 Malignant neoplasm of unspecified site of right female breast: Secondary | ICD-10-CM

## 2014-09-21 DIAGNOSIS — Z171 Estrogen receptor negative status [ER-]: Secondary | ICD-10-CM

## 2014-09-21 DIAGNOSIS — H04209 Unspecified epiphora, unspecified lacrimal gland: Secondary | ICD-10-CM | POA: Insufficient documentation

## 2014-09-21 DIAGNOSIS — Z86718 Personal history of other venous thrombosis and embolism: Secondary | ICD-10-CM | POA: Insufficient documentation

## 2014-09-21 LAB — CBC WITH DIFFERENTIAL/PLATELET
BASO%: 0.2 % (ref 0.0–2.0)
BASOS ABS: 0 10*3/uL (ref 0.0–0.1)
EOS%: 0.4 % (ref 0.0–7.0)
Eosinophils Absolute: 0.1 10*3/uL (ref 0.0–0.5)
HCT: 32.7 % — ABNORMAL LOW (ref 34.8–46.6)
HGB: 11 g/dL — ABNORMAL LOW (ref 11.6–15.9)
LYMPH#: 2.2 10*3/uL (ref 0.9–3.3)
LYMPH%: 14 % (ref 14.0–49.7)
MCH: 31.6 pg (ref 25.1–34.0)
MCHC: 33.5 g/dL (ref 31.5–36.0)
MCV: 94.2 fL (ref 79.5–101.0)
MONO#: 2 10*3/uL — AB (ref 0.1–0.9)
MONO%: 12.8 % (ref 0.0–14.0)
NEUT#: 11.3 10*3/uL — ABNORMAL HIGH (ref 1.5–6.5)
NEUT%: 72.6 % (ref 38.4–76.8)
Platelets: 171 10*3/uL (ref 145–400)
RBC: 3.47 10*6/uL — AB (ref 3.70–5.45)
RDW: 17.7 % — AB (ref 11.2–14.5)
WBC: 15.6 10*3/uL — ABNORMAL HIGH (ref 3.9–10.3)

## 2014-09-21 LAB — COMPREHENSIVE METABOLIC PANEL (CC13)
ALK PHOS: 99 U/L (ref 40–150)
ALT: 25 U/L (ref 0–55)
AST: 23 U/L (ref 5–34)
Albumin: 4 g/dL (ref 3.5–5.0)
Anion Gap: 9 mEq/L (ref 3–11)
BUN: 16 mg/dL (ref 7.0–26.0)
CALCIUM: 9.8 mg/dL (ref 8.4–10.4)
CO2: 28 mEq/L (ref 22–29)
Chloride: 97 mEq/L — ABNORMAL LOW (ref 98–109)
Creatinine: 0.9 mg/dL (ref 0.6–1.1)
EGFR: 61 mL/min/{1.73_m2} — AB (ref >90)
Glucose: 96 mg/dl (ref 70–140)
Potassium: 4 mEq/L (ref 3.5–5.1)
Sodium: 134 mEq/L — ABNORMAL LOW (ref 136–145)
Total Bilirubin: 0.6 mg/dL (ref 0.20–1.20)
Total Protein: 6.9 g/dL (ref 6.4–8.3)

## 2014-09-21 LAB — HOLD TUBE, BLOOD BANK

## 2014-09-21 MED ORDER — SODIUM CHLORIDE 0.9 % IJ SOLN
10.0000 mL | INTRAMUSCULAR | Status: DC | PRN
  Administered 2014-09-21: 10 mL
  Filled 2014-09-21: qty 10

## 2014-09-21 MED ORDER — SODIUM CHLORIDE 0.9 % IV SOLN
Freq: Once | INTRAVENOUS | Status: AC
  Administered 2014-09-21: 14:00:00 via INTRAVENOUS
  Filled 2014-09-21: qty 4

## 2014-09-21 MED ORDER — TOBRAMYCIN-DEXAMETHASONE 0.3-0.1 % OP SUSP: 2.0000 [drp] | OPHTHALMIC | Status: DC

## 2014-09-21 MED ORDER — ALTEPLASE 2 MG IJ SOLR
2.0000 mg | Freq: Once | INTRAMUSCULAR | Status: AC | PRN
  Administered 2014-09-21: 2 mg
  Filled 2014-09-21: qty 2

## 2014-09-21 MED ORDER — SODIUM CHLORIDE 0.9 % IV SOLN
Freq: Once | INTRAVENOUS | Status: AC
  Administered 2014-09-21: 14:00:00 via INTRAVENOUS

## 2014-09-21 MED ORDER — HEPARIN SOD (PORK) LOCK FLUSH 100 UNIT/ML IV SOLN
500.0000 [IU] | Freq: Once | INTRAVENOUS | Status: AC | PRN
  Administered 2014-09-21: 500 [IU]
  Filled 2014-09-21: qty 5

## 2014-09-21 NOTE — Progress Notes (Signed)
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 Non-of the 25 lymph nodes removed from the right axilla were involved. The tumor was estrogen and progesterone receptor positive, HER-2 not amplified. She was treated adjuvantly with doxorubicin and cyclophosphamide 4, then received 5 years of antiestrogen therapy (initially tamoxifen, later letrozole). There has been no evidence of disease recurrence on the right.  More recently, Laura Li had routine left screening mamm[facility] 2014. This showed a suspicious area of architectural distortion in the inferior portion of the left breast. On 69/62/9528 Laura Li underwent left diagnostic mammography with ultrasonography. Spot compression views confirmed the presence 7 area of distortion in the lower central left breast, which was not palpable by physical exam (there was a prior biopsy scar in the upper outer quadrant of the left breast). Ultrasound confirmed a hypoechoic spiculated mass at the 6:00 location in the left breast, measuring maximally 1.3 cm. Sonography of the left axilla was negative.  Biopsy of the left breast mass in question 120 10/29/2014 showed ([id number]) an invasive breast cancer with squamous features. It was estrogen and progesterone receptor negative. There was no HER-2 amplification, the signals ratio being 0.82 and the number per cell 1.80. The  MIB-1 was 10%.  The patient has met with Dr. Donne Hazel in surgery and Dr. Pablo Ledger in radiation oncology. The patient understands there is no survival difference between lumpectomy and radiation compared with mastectomy. The patient will need sentinel lymph node sampling with either of those procedures. If she does have a lumpectomy, she will benefit from adjuvant radiation. The patient has been set up for genetics testing and this may affect her ultimate surgical decision.  Her subsequent history is as detailed below  INTERVAL HISTORY: Laura Li returns today for follow-up of her breast cancer, accompanied by her daughter, Laura Li. She was seen in the infusion room today because of scheduling conflicts. Today is day 8 cycle 4 of 4 planned cycles of cyclophosphamide and docetaxel, given every 21 days, with Neulasta on day 2.   REVIEW OF SYSTEMS: Laura Li is fatigued today, and wonders if she needs blood. She is usually sluggish at this point in the cycle. We prearranged for fluids to be given at this point because of this. Otherwise her final cycle of chemo went well. She denies fevers or chills. She has felt mildly nauseous, but never too any meds. She had some mild heartburn. Her bowels are towards the looser end, but no diarrhea. Her appetite is down. She had thrush but it is clearing with the nystatin rinse. She denies mouth sores, rashes, or neuropathy symptoms. Her eyes continue to tear excessively even since stopping the restasis. A detailed review of systems is otherwise stable.  PAST MEDICAL HISTORY: Past Medical History  Diagnosis Date  . OA (osteoarthritis)   . History of DVT (deep vein thrombosis)   . Hyperlipidemia   . Hypertension   . Hypothyroidism   . HX: breast cancer  melanoma  . Melanoma   . Eczema   . Arthritis   . Esophageal stricture   . GERD (gastroesophageal reflux disease)   . PONV (postoperative nausea and vomiting)   . Wears glasses   . Depression     states no  depression  . Chronic kidney disease     some renal impairment from meds  . Headache   . Anemia     from medicaions  . Heart murmur     states it is benign told 30-40 years ago  . Pneumothorax on right 06/15/2014    PAST SURGICAL HISTORY: Past Surgical History  Procedure Laterality Date  . Appendectomy  1967  . Melanoma excision  2004    right side of face  . 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 Total abdominal hysterectomy  1992    including ovaries  . Tonsillectomy and adenoidectomy  1949  . Lipoma excision  1978    right breast  . Mastectomy  1998    right-nodes out  . Breast lumpectomy      left breast-benign  . Breast surgery  1977    removal of calcified milk gland right breast  . Colonoscopy    . Radioactive seed guided mastectomy with axillary sentinel lymph node biopsy Left 05/05/2014    Procedure: RADIOACTIVE SEED GUIDED LEFT LUMPECTOMY WITH AXILLARY SENTINEL LYMPH NODE BIOPSY;  Surgeon: Matthew Wakefield, MD;  Location: [facility];  Service: General;  Laterality: Left;  . Chest tube insertion  06/15/2014  . Portacath placement Left 06/15/2014    Procedure: INSERTION PORT-A-CATH;  Surgeon: Matthew Wakefield, MD;  Location: [facility] OR;  Service: General;  Laterality: Left;  . Chest tube insertion Bilateral 06/15/2014    Procedure: CHEST TUBE INSERTION;  Surgeon: Matthew Wakefield, MD;  Location: [facility] OR;  Service: General;  Laterality: Bilateral;    FAMILY HISTORY Family History  Problem Relation Age of Onset  . Heart disease Father 57    smoker  . Endometrial cancer Mother 63  . Breast cancer Cousin     x 3 cousins with breast cancer (one at 45, one at 59 and one at 64)  . Lung cancer Paternal Aunt     smoker  . Brain cancer Maternal Aunt   . Colon cancer Neg Hx   . Diabetes Maternal Aunt     ???  . Breast cancer Maternal Aunt 51  The patient's father died at the age of 57 from a myocardial infarction. The patient's mother died at the age  of 63 from metastatic endometrial cancer which had been diagnosed 2 years before. The patient had no brothers, one sister. The patient's mother's sister was diagnosed with breast cancer but the patient does not know at what age. A second maternal aunt was diagnosed with brain cancer. The patient has 3 cousins on her mother's side diagnosed with breast cancer, one of them under the age of 40. On the father's side there is a history of lung and cervical cancer  GYNECOLOGIC HISTORY:  No LMP recorded. Patient has had a hysterectomy. Menarche age 11, first live birth age 24. The patient is GX P1. She underwent hysterectomy with bilateral salpingo-oophorectomy in 1991. She used hormone replacement for approximately 7 years until 1998, when she had her right-sided breast cancer. She used oral contraceptives for approximately one year with no complications. 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 She is widowed and lives alone with no pets. Her main hobby is painting. Her daughter Laura Li lives in [place] and is also a homemaker. The patient has 2 grandchildren. She attends wholly [employer]. The patient's daughter Laura Li is her healthcare power of attorney. Laura Li can be reached at [phone]   HEALTH MAINTENANCE: History  Substance Use Topics  . Smoking status: Former Smoker -- 0.75 packs/day for 3 years    Types: Cigarettes    Quit date: 03/11/1969  . Smokeless tobacco: Never Used  . Alcohol Use: 0.0 oz/week    0 Standard drinks or equivalent per week     Comment: occasional wine     Colonoscopy: 20/10/John Henrene Pastor  NOM:VEHMCN post hysterectomy  Bone density:2014/osteopenia  Lipid panel:  Allergies  Allergen Reactions  . Dyazide [Hydrochlorothiazide W-Triamterene]     Presumption: drug induced  vasculitis  . Niacin Other (See Comments)    flushing  . Penicillins Diarrhea and Nausea And Vomiting    Current Outpatient Prescriptions  Medication Sig Dispense Refill  . acetaminophen (TYLENOL) 500 MG tablet Take 500 mg by mouth 2 (two) times daily as needed for mild pain.     [name] atorvastatin (LIPITOR) 20 MG tablet Take 1 tablet (20 mg total) by mouth daily. (Patient taking differently: Take 10 mg by mouth daily. ) 90 tablet 3  . Calcium-Vitamin D (CALTRATE 600 PLUS-VIT D PO) Take 1 tablet by mouth 2 (two) times daily.     . Carboxymethylcellulose Sodium (REFRESH LIQUIGEL OP) Apply 1 drop to eye at bedtime as needed (dry eyes).    . cholestyramine (QUESTRAN) 4 G packet Take 1 packet (4 g total) by mouth 3 (three) times daily with meals. (Patient not taking: Reported on 09/13/2014) 60 each 1  . cycloSPORINE (RESTASIS) 0.05 % ophthalmic emulsion Place 1 drop into both eyes 2 (two) times daily. (Patient not taking: Reported on 09/13/2014) 30 mL 3  . dexamethasone (DECADRON) 4 MG tablet Take 2 tablets (8 mg total) by mouth 2 (two) times daily. Start the day before Taxotere. Then again the day after chemo for 3 days. 6 tablet 1  . docusate sodium (COLACE) 100 MG capsule Take 1 capsule (100 mg total) by mouth 2 (two) times daily. (Patient not taking: Reported on 08/22/2014) 40 capsule 0  . esomeprazole (NEXIUM) 40 MG capsule TAKE 1 CAPSULE DAILY (Patient taking differently: Take 40 mg by mouth daily. TAKE 1 CAPSULE DAILY) 90 capsule 3  . fexofenadine (ALLEGRA) 180 MG tablet Take 180 mg by mouth daily as needed for allergies.     [name] ipratropium (ATROVENT) 0.03 % nasal spray Place 2 sprays into the nose every 12 (twelve) hours. 180 mL 4  . Lactobacillus-Inulin (CULTURELLE DIGESTIVE HEALTH) CHEW Chew 1 capsule by mouth daily.     [name] levothyroxine (SYNTHROID, LEVOTHROID) 75 MCG tablet Take 1 tablet (75 mcg total) by mouth daily. 90 tablet 2  . lidocaine-prilocaine (EMLA) cream Apply to affected area once 30 g  3  . LORazepam (ATIVAN) 0.5 MG tablet 1/2 to 1 tab po q 8 hours prn (Patient not taking: Reported on 08/29/2014) 30 tablet 1  . nadolol (CORGARD) 20 MG tablet Take 0.5 tablets (10 mg total) by mouth daily. 90 tablet 1  . nystatin (MYCOSTATIN) 100000 UNIT/ML suspension Take 5 mLs (500,000 Units total) by mouth 4 (four) times daily. [place]  mL 0  . Polyethyl Glycol-Propyl Glycol (SYSTANE) 0.4-0.3 % SOLN Apply 1 drop to eye daily as needed (for dryness/itching).     . prochlorperazine (COMPAZINE) 10 MG tablet Take 1 tablet (10 mg total) by mouth every 6 (six) hours as needed (Nausea or vomiting). (Patient not taking: Reported on 08/29/2014) 30 tablet 1  . traMADol (ULTRAM) 50 MG tablet TAKE 1 TABLET BY MOUTH EVERY 6 HOURS AS NEEDED FOR MODERATE/SEVERE PAIN (Patient not taking: Reported on 08/09/2014) 20 tablet 0  . venlafaxine XR (EFFEXOR-XR) 150 MG 24 hr capsule Take 1 capsule (150 mg total) by mouth daily. 180 capsule 0   No current facility-administered medications for this visit.    OBJECTIVE: Older white woman who appears younger than stated age 58 Vitals:   09/21/14 1315  BP: 142/66  Pulse: 87  Temp: 97.7 F (36.5 C)  Resp: 18     There is no weight on file to calculate BMI.    ECOG FS:1 - Symptomatic but completely ambulatory  Skin: warm, dry  HEENT: sclerae anicteric, conjunctivae pink, oropharynx clear. No thrush or mucositis.  Lymph Nodes: No cervical or supraclavicular lymphadenopathy  Lungs: clear to auscultation bilaterally, no rales, wheezes, or rhonci  Heart: regular rate and rhythm  Abdomen: round, soft, non tender, positive bowel sounds  Musculoskeletal: No focal spinal tenderness, no peripheral edema  Neuro: non focal, well oriented, positive affect  Breasts: deferred  LAB RESULTS:  CMP     Component Value Date/Time   NA 134* 09/21/2014 1111   NA 138 06/14/2014 1025   K 4.0 09/21/2014 1111   K 4.1 06/14/2014 1025   CL 100 06/14/2014 1025   CO2 28 09/21/2014 1111     CO2 33* 06/14/2014 1025   GLUCOSE 96 09/21/2014 1111   GLUCOSE 79 06/14/2014 1025   GLUCOSE 88 02/06/2006 1059   BUN 16.0 09/21/2014 1111   BUN 16 06/14/2014 1025   CREATININE 0.9 09/21/2014 1111   CREATININE 0.97 06/14/2014 1025   CALCIUM 9.8 09/21/2014 1111   CALCIUM 9.9 06/14/2014 1025   PROT 6.9 09/21/2014 1111   PROT 8.5* 12/21/2013 1004   ALBUMIN 4.0 09/21/2014 1111   ALBUMIN 4.2 12/21/2013 1004   AST 23 09/21/2014 1111   AST 24 12/21/2013 1004   ALT 25 09/21/2014 1111   ALT 19 12/21/2013 1004   ALKPHOS 99 09/21/2014 1111   ALKPHOS 64 12/21/2013 1004   BILITOT 0.60 09/21/2014 1111   BILITOT 0.8 12/21/2013 1004   GFRNONAA 56* 06/14/2014 1025   GFRAA 65* 06/14/2014 1025    INo results found for: SPEP, UPEP  Lab Results  Component Value Date   WBC 15.6* 09/21/2014   NEUTROABS 11.3* 09/21/2014   HGB 11.0* 09/21/2014   HCT 32.7* 09/21/2014   MCV 94.2 09/21/2014   PLT 171 09/21/2014      Chemistry      Component Value Date/Time   NA 134* 09/21/2014 1111   NA 138 06/14/2014 1025   K 4.0 09/21/2014 1111   K 4.1 06/14/2014 1025   CL 100 06/14/2014 1025   CO2 28 09/21/2014 1111   CO2 33* 06/14/2014 1025   BUN 16.0 09/21/2014 1111   BUN 16 06/14/2014 1025   CREATININE 0.9 09/21/2014 1111   CREATININE 0.97 06/14/2014 1025      Component Value Date/Time   CALCIUM 9.8 09/21/2014 1111   CALCIUM 9.9 06/14/2014 1025   ALKPHOS 99 09/21/2014 1111   ALKPHOS 64 12/21/2013 1004   AST 23  09/21/2014 1111   AST 24 12/21/2013 1004   ALT 25 09/21/2014 1111   ALT 19 12/21/2013 1004   BILITOT 0.60 09/21/2014 1111   BILITOT 0.8 12/21/2013 1004       No results found for: LABCA2  No components found for: LABCA125  No results for input(s): INR in the last 168 hours.  Urinalysis    Component Value Date/Time   COLORURINE YELLOW 05/17/2013 1330   APPEARANCEUR CLEAR 05/17/2013 1330   LABSPEC 1.005 08/09/2014 1052   LABSPEC 1.025 05/17/2013 1330   PHURINE 7.0  08/09/2014 1052   PHURINE 5.5 05/17/2013 1330   GLUCOSEU Negative 08/09/2014 1052   GLUCOSEU NEGATIVE 05/17/2013 1330   HGBUR Negative 08/09/2014 1052   HGBUR NEGATIVE 05/17/2013 1330   BILIRUBINUR Negative 08/09/2014 1052   BILIRUBINUR n 06/21/2013 1115   BILIRUBINUR NEGATIVE 05/17/2013 1330   KETONESUR Negative 08/09/2014 1052   KETONESUR NEGATIVE 05/17/2013 1330   PROTEINUR Negative 08/09/2014 1052   PROTEINUR n 06/21/2013 1115   PROTEINUR NEGATIVE 05/17/2013 1330   UROBILINOGEN 0.2 08/09/2014 1052   UROBILINOGEN 0.2 06/21/2013 1115   UROBILINOGEN 0.2 05/17/2013 1330   NITRITE Negative 08/09/2014 1052   NITRITE n 06/21/2013 1115   NITRITE NEGATIVE 05/17/2013 1330   LEUKOCYTESUR Negative 08/09/2014 1052   LEUKOCYTESUR Trace 06/21/2013 1115    STUDIES: No results found.  ASSESSMENT: 73 y.o. [place] woman  (1) status post left modified radical mastectomy November 1998 for a pT2 pN0, stage IIA invasive ductal carcinoma, grade 2, estrogen and progesterone receptor positive, HER-2 not amplified  (a) adjuvant chemotherapy consisted of doxorubicin and cyclophosphamide 4  (b) adjuvant anti-estrogens consisted of tamoxifen, then letrozole, for a total of 5 years  (2) status post left breast lower outer quadrant biopsy 04/07/2014 for a clinical T1c N0,stage IA  invasive ductal carcinoma, with squamous features, estrogen and progesterone receptor negative, HER-2 not amplified, with an MIB-1 of 10%   (3) left lumpectomy and sentinel lymph node biopsy 05/05/2014 showed aT1c pN0, stage IA invasive ductal carcinoma, grade 1, triple negative, with close but negative margins.   (4) Mammaprint classifies the tumor as basal like, high risk, and predicts a 30% chance of recurrence within 10 years with local treatment only.   (5) start of treatment delayed because of bilateral pneumothoraces after port placement. adjuvant chemotherapy. Finally started 07/11/2014 to consisting of carboplatin and  docetaxel given every 21 days 4 with Neulasta support.  (6) genetics testing March 2016 showed no deleterious mutation in the [employer] panel  [[employer] [employer]] including sequencing and rearrangement analysis for the following 24 genes:ATM, BARD1, BRCA1, BRCA2, BRIP1, CDH1, CHEK2, EPCAM, MLH1, MRE11A, MSH2, MSH6, MUTYH, NBN, NF1, PALB2, PMS2, PTEN, RAD50, RAD51C, RAD51D, SMARCA4, STK11, and TP53.   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    PLAN: Laura Li is happy to have completed chemotherapy. The labs were reviewed in detail and her hgb actually increased to 11.0 today. Her fatigue is then related to the chemo itself and not a drop in counts. The IV fluids she is receiving today should help perk her up some.   We are going to try the tobradex drops to see if they work any better for her. I suspect that the further she gets away for her last chemo date, the less of an issues this will be be.   She meets with Dr. Wentworth this afternoon to discuss radiation therapy. She will return in August for a   follow up visit with Dr. Magrinat. She understands and agrees with this plan. She knows the goal of treatment in her case is cure. She has been encouraged to call with any issues that might arise before her next visit here.   Heather F Boelter, NP   09/21/2014 1:44 PM 

## 2014-09-21 NOTE — Progress Notes (Signed)
1415: Pt escorted to radiation oncology by Nicki Reaper, rad tech.

## 2014-09-21 NOTE — Progress Notes (Signed)
Pt reports thrush since her last visit and started using Nystatin rinse, and thrush has improved, pt denies mouth sore. Pt states that her stomach feels upset but has not had diarrhea, but has an decrease in appetite. Heather over to infusion to assess pt.

## 2014-09-21 NOTE — Patient Instructions (Signed)
Dehydration, Adult Dehydration is when you lose more fluids from the body than you take in. Vital organs like the kidneys, brain, and heart cannot function without a proper amount of fluids and salt. Any loss of fluids from the body can cause dehydration.  CAUSES   Vomiting.  Diarrhea.  Excessive sweating.  Excessive urine output.  Fever. SYMPTOMS  Mild dehydration  Thirst.  Dry lips.  Slightly dry mouth. Moderate dehydration  Very dry mouth.  Sunken eyes.  Skin does not bounce back quickly when lightly pinched and released.  Dark urine and decreased urine production.  Decreased tear production.  Headache. Severe dehydration  Very dry mouth.  Extreme thirst.  Rapid, weak pulse (more than 100 beats per minute at rest).  Cold hands and feet.  Not able to sweat in spite of heat and temperature.  Rapid breathing.  Blue lips.  Confusion and lethargy.  Difficulty being awakened.  Minimal urine production.  No tears. DIAGNOSIS  Your caregiver will diagnose dehydration based on your symptoms and your exam. Blood and urine tests will help confirm the diagnosis. The diagnostic evaluation should also identify the cause of dehydration. TREATMENT  Treatment of mild or moderate dehydration can often be done at home by increasing the amount of fluids that you drink. It is best to drink small amounts of fluid more often. Drinking too much at one time can make vomiting worse. Refer to the home care instructions below. Severe dehydration needs to be treated at the hospital where you will probably be given intravenous (IV) fluids that contain water and electrolytes. HOME CARE INSTRUCTIONS   Ask your caregiver about specific rehydration instructions.  Drink enough fluids to keep your urine clear or pale yellow.  Drink small amounts frequently if you have nausea and vomiting.  Eat as you normally do.  Avoid:  Foods or drinks high in sugar.  Carbonated  drinks.  Juice.  Extremely hot or cold fluids.  Drinks with caffeine.  Fatty, greasy foods.  Alcohol.  Tobacco.  Overeating.  Gelatin desserts.  Wash your hands well to avoid spreading bacteria and viruses.  Only take over-the-counter or prescription medicines for pain, discomfort, or fever as directed by your caregiver.  Ask your caregiver if you should continue all prescribed and over-the-counter medicines.  Keep all follow-up appointments with your caregiver. SEEK MEDICAL CARE IF:  You have abdominal pain and it increases or stays in one area (localizes).  You have a rash, stiff neck, or severe headache.  You are irritable, sleepy, or difficult to awaken.  You are weak, dizzy, or extremely thirsty. SEEK IMMEDIATE MEDICAL CARE IF:   You are unable to keep fluids down or you get worse despite treatment.  You have frequent episodes of vomiting or diarrhea.  You have blood or green matter (bile) in your vomit.  You have blood in your stool or your stool looks black and tarry.  You have not urinated in 6 to 8 hours, or you have only urinated a small amount of very dark urine.  You have a fever.  You faint. MAKE SURE YOU:   Understand these instructions.  Will watch your condition.  Will get help right away if you are not doing well or get worse. Document Released: 02/25/2005 Document Revised: 05/20/2011 Document Reviewed: 10/15/2010 ExitCare Patient Information 2015 [employer]. This information is not intended to replace advice given to you by your health care provider. Make sure you discuss any questions you have with your health care   provider.  

## 2014-09-21 NOTE — Progress Notes (Signed)
   Department of Radiation Oncology  Phone:  [phone] Fax:        [phone]   Name: Laura Li MRN: [id number]  DOB: 1940-08-15  Date: 09/21/2014  Follow Up Visit Note  Diagnosis: TIcN0 Invasive Ductal Carcinoma of the Left Breast, Triple Negative  Interval History: Cynde presents today for routine followup. She underwent left breast lumpectomy and sentinel lymph node biopsy on 05/05/14 which showed a 1.7 cm tumor with negative margins. 0 out of 1 sentinel lymph nodes were positive. The pathologist notes microscopic tumor extension to within 1 mm of the anterior margin. She has completed 4 cycles of TC chemotherapy and that has been complicated by fatigue, thrush, and mild heartburn. She is receiving regular IV fluids. Her last chemotherapy was on 09/13/14. She is ready to begin radiation. She also has a f/u with Dr. Jana Hakim in August. She presents today with her daughter. She is receiving IV fluids. She is interested in removing her portacath prior to radiation.   Physical Exam:  Filed Vitals:   09/21/14 1423  BP: 139/59  Pulse: 77  Temp: 97.8 F (36.6 C)  Height: 5\' 6"  (1.676 m)  Weight: 139 lb 6.4 oz (63.231 kg)  The pt presents with IV fluids via port-a-cath. The pt is alert and oriented times three. She is pleasant. Her portacath is infusing IV fluids iwht no apparent complications.   IMPRESSION: Bertha is a 74 y.o. female with TIcN0 Invasive Ductal Carcinoma of the Left Breast, Triple Negative  PLAN: We discussed the role of radiation and decreasing local failures in patients who undergo lumpectomy.  For this reason I have recommended radiation to the whole breast followed by boost to the tumor bed. We discussed the process of simulation and the placement of tattoos. We discussed possible side effects during treatment including but not limited to skin irritation, darkness and fatigue. We discussed long-term effects of treatment which are extremely unlikely but possible  including damage to the lungs and ribs.  We discussed the use of breath hold technique for cardiac sparing if necessary. We discussed the low likelihood of secondary malignancies. We discussed the possible late side effects including but not limited to permanent skin darkening, and breast swelling.  The pt would like to have her port-a-cath removed before undergoing radiation treatment. I have discussed this with Dr. Jana Hakim and he agreed. She will contact Dr. Cristal Generous office to have this scheduled and we will schedule her for simulation following this with plans to start in mid August.   This document serves as a record of services personally performed by Thea Silversmith, MD. It was created on her behalf by Darcus Austin, a trained medical scribe. The creation of this record is based on the scribe's personal observations and the provider's statements to them. This document has been checked and approved by the attending provider.   Thea Silversmith, MD

## 2014-09-23 ENCOUNTER — Other Ambulatory Visit: Payer: Self-pay | Admitting: General Surgery

## 2014-09-29 ENCOUNTER — Ambulatory Visit: Payer: Medicare Other | Admitting: Family Medicine

## 2014-09-29 ENCOUNTER — Telehealth: Payer: Self-pay | Admitting: *Deleted

## 2014-09-29 NOTE — Telephone Encounter (Signed)
This RN spoke with pt per return call and discussed need to proceed to eye doctor for further evaluation.  Per discussion pt understands tearing is probably related to use of Taxotere- and will inform her opthalmologic as well as use of restatsis and Tobradex without benefit.  Pt verbalized understanding and is contacting her eye doctor- Dr Bing Plume.

## 2014-09-29 NOTE — Telephone Encounter (Signed)
PT. STOPPED RESTASIS. SHE HAS BEEN TAKING TOBRADEX FOR ONE WEEK AND IT IS NOT HELPING WITH HER EYES TEARING.

## 2014-10-03 ENCOUNTER — Encounter (HOSPITAL_BASED_OUTPATIENT_CLINIC_OR_DEPARTMENT_OTHER): Payer: Self-pay | Admitting: *Deleted

## 2014-10-03 DIAGNOSIS — H04123 Dry eye syndrome of bilateral lacrimal glands: Secondary | ICD-10-CM | POA: Diagnosis not present

## 2014-10-04 ENCOUNTER — Encounter (HOSPITAL_BASED_OUTPATIENT_CLINIC_OR_DEPARTMENT_OTHER): Payer: Self-pay | Admitting: *Deleted

## 2014-10-05 ENCOUNTER — Encounter (HOSPITAL_BASED_OUTPATIENT_CLINIC_OR_DEPARTMENT_OTHER)
Admission: RE | Disposition: A | Discharge status: Home / Self Care | Payer: Self-pay | Source: Ambulatory Visit | Attending: General Surgery

## 2014-10-05 ENCOUNTER — Ambulatory Visit (HOSPITAL_BASED_OUTPATIENT_CLINIC_OR_DEPARTMENT_OTHER): Payer: Medicare Other | Admitting: Anesthesiology

## 2014-10-05 ENCOUNTER — Ambulatory Visit (HOSPITAL_BASED_OUTPATIENT_CLINIC_OR_DEPARTMENT_OTHER)
Admission: RE | Admit: 2014-10-05 | Discharge: 2014-10-05 | Disposition: A | Discharge status: Home / Self Care | Payer: Medicare Other | Source: Ambulatory Visit | Attending: General Surgery | Admitting: General Surgery

## 2014-10-05 ENCOUNTER — Encounter (HOSPITAL_BASED_OUTPATIENT_CLINIC_OR_DEPARTMENT_OTHER): Payer: Self-pay

## 2014-10-05 DIAGNOSIS — K219 Gastro-esophageal reflux disease without esophagitis: Secondary | ICD-10-CM | POA: Diagnosis not present

## 2014-10-05 DIAGNOSIS — F329 Major depressive disorder, single episode, unspecified: Secondary | ICD-10-CM | POA: Diagnosis not present

## 2014-10-05 DIAGNOSIS — C50912 Malignant neoplasm of unspecified site of left female breast: Secondary | ICD-10-CM | POA: Diagnosis not present

## 2014-10-05 DIAGNOSIS — Z86718 Personal history of other venous thrombosis and embolism: Secondary | ICD-10-CM | POA: Insufficient documentation

## 2014-10-05 DIAGNOSIS — Z85820 Personal history of malignant melanoma of skin: Secondary | ICD-10-CM | POA: Diagnosis not present

## 2014-10-05 DIAGNOSIS — I129 Hypertensive chronic kidney disease with stage 1 through stage 4 chronic kidney disease, or unspecified chronic kidney disease: Secondary | ICD-10-CM | POA: Diagnosis not present

## 2014-10-05 DIAGNOSIS — Z79899 Other long term (current) drug therapy: Secondary | ICD-10-CM | POA: Diagnosis not present

## 2014-10-05 DIAGNOSIS — Z87891 Personal history of nicotine dependence: Secondary | ICD-10-CM | POA: Insufficient documentation

## 2014-10-05 DIAGNOSIS — Z452 Encounter for adjustment and management of vascular access device: Secondary | ICD-10-CM | POA: Diagnosis not present

## 2014-10-05 DIAGNOSIS — Z853 Personal history of malignant neoplasm of breast: Secondary | ICD-10-CM | POA: Diagnosis not present

## 2014-10-05 DIAGNOSIS — N189 Chronic kidney disease, unspecified: Secondary | ICD-10-CM | POA: Diagnosis not present

## 2014-10-05 DIAGNOSIS — E785 Hyperlipidemia, unspecified: Secondary | ICD-10-CM | POA: Diagnosis not present

## 2014-10-05 DIAGNOSIS — Z9011 Acquired absence of right breast and nipple: Secondary | ICD-10-CM | POA: Insufficient documentation

## 2014-10-05 DIAGNOSIS — E039 Hypothyroidism, unspecified: Secondary | ICD-10-CM | POA: Insufficient documentation

## 2014-10-05 DIAGNOSIS — L309 Dermatitis, unspecified: Secondary | ICD-10-CM | POA: Insufficient documentation

## 2014-10-05 DIAGNOSIS — Z803 Family history of malignant neoplasm of breast: Secondary | ICD-10-CM | POA: Insufficient documentation

## 2014-10-05 DIAGNOSIS — M1990 Unspecified osteoarthritis, unspecified site: Secondary | ICD-10-CM | POA: Diagnosis not present

## 2014-10-05 DIAGNOSIS — Z9221 Personal history of antineoplastic chemotherapy: Secondary | ICD-10-CM | POA: Insufficient documentation

## 2014-10-05 HISTORY — PX: PORT-A-CATH REMOVAL: SHX5289

## 2014-10-05 SURGERY — REMOVAL PORT-A-CATH
Anesthesia: Monitor Anesthesia Care | Site: Chest

## 2014-10-05 MED ORDER — FENTANYL CITRATE (PF) 100 MCG/2ML IJ SOLN
25.0000 ug | INTRAMUSCULAR | Status: DC | PRN
  Administered 2014-10-05: 100 ug via INTRAVENOUS

## 2014-10-05 MED ORDER — MIDAZOLAM HCL 2 MG/2ML IJ SOLN
1.0000 mg | INTRAMUSCULAR | Status: DC | PRN
  Administered 2014-10-05: 1 mg via INTRAVENOUS

## 2014-10-05 MED ORDER — FENTANYL CITRATE (PF) 100 MCG/2ML IJ SOLN: 50.0000 ug | INTRAMUSCULAR | Status: DC | PRN

## 2014-10-05 MED ORDER — GLYCOPYRROLATE 0.2 MG/ML IJ SOLN: 0.2000 mg | Freq: Once | INTRAMUSCULAR | Status: DC | PRN

## 2014-10-05 MED ORDER — MIDAZOLAM HCL 2 MG/2ML IJ SOLN
INTRAMUSCULAR | Status: AC
  Filled 2014-10-05: qty 2

## 2014-10-05 MED ORDER — SCOPOLAMINE 1 MG/3DAYS TD PT72: 1.0000 | MEDICATED_PATCH | Freq: Once | TRANSDERMAL | Status: DC | PRN

## 2014-10-05 MED ORDER — ONDANSETRON HCL 4 MG/2ML IJ SOLN
INTRAMUSCULAR | Status: DC | PRN
  Administered 2014-10-05: 4 mg via INTRAVENOUS

## 2014-10-05 MED ORDER — PROPOFOL 500 MG/50ML IV EMUL
INTRAVENOUS | Status: AC
Start: 2014-10-05 — End: 2014-10-05
  Filled 2014-10-05: qty 50

## 2014-10-05 MED ORDER — FENTANYL CITRATE (PF) 100 MCG/2ML IJ SOLN
INTRAMUSCULAR | Status: AC
  Filled 2014-10-05: qty 2

## 2014-10-05 MED ORDER — PROPOFOL INFUSION 10 MG/ML OPTIME
INTRAVENOUS | Status: DC | PRN
  Administered 2014-10-05: 50 ug/kg/min via INTRAVENOUS

## 2014-10-05 MED ORDER — PROPOFOL 10 MG/ML IV BOLUS
INTRAVENOUS | Status: DC | PRN
  Administered 2014-10-05: 20 mg via INTRAVENOUS

## 2014-10-05 MED ORDER — BUPIVACAINE HCL (PF) 0.25 % IJ SOLN
INTRAMUSCULAR | Status: AC
  Filled 2014-10-05: qty 30

## 2014-10-05 MED ORDER — LIDOCAINE-EPINEPHRINE (PF) 1 %-1:200000 IJ SOLN
INTRAMUSCULAR | Status: AC
  Filled 2014-10-05: qty 10

## 2014-10-05 MED ORDER — LIDOCAINE-EPINEPHRINE (PF) 1 %-1:200000 IJ SOLN
INTRAMUSCULAR | Status: DC | PRN
  Administered 2014-10-05: 5 mL

## 2014-10-05 MED ORDER — LACTATED RINGERS IV SOLN
INTRAVENOUS | Status: DC
  Administered 2014-10-05: 08:00:00 via INTRAVENOUS

## 2014-10-05 MED ORDER — BUPIVACAINE HCL (PF) 0.25 % IJ SOLN
INTRAMUSCULAR | Status: DC | PRN
  Administered 2014-10-05: 5 mL

## 2014-10-05 SURGICAL SUPPLY — 29 items
BLADE SURG 15 STRL LF DISP TIS (BLADE) ×1 IMPLANT
BLADE SURG 15 STRL SS (BLADE) ×3
CHLORAPREP W/TINT 26ML (MISCELLANEOUS) ×3 IMPLANT
COVER BACK TABLE 60X90IN (DRAPES) ×3 IMPLANT
COVER MAYO STAND STRL (DRAPES) ×3 IMPLANT
DECANTER SPIKE VIAL GLASS SM (MISCELLANEOUS) ×3 IMPLANT
DRAPE LAPAROTOMY 100X72 PEDS (DRAPES) ×3 IMPLANT
ELECT COATED BLADE 2.86 ST (ELECTRODE) ×3 IMPLANT
ELECT REM PT RETURN 9FT ADLT (ELECTROSURGICAL) ×3
ELECTRODE REM PT RTRN 9FT ADLT (ELECTROSURGICAL) ×1 IMPLANT
GLOVE BIO SURGEON STRL SZ7 (GLOVE) ×3 IMPLANT
GLOVE BIOGEL PI IND STRL 7.5 (GLOVE) ×1 IMPLANT
GLOVE BIOGEL PI INDICATOR 7.5 (GLOVE) ×2
GOWN STRL REUS W/ TWL LRG LVL3 (GOWN DISPOSABLE) ×3 IMPLANT
GOWN STRL REUS W/TWL LRG LVL3 (GOWN DISPOSABLE) ×9
LIQUID BAND (GAUZE/BANDAGES/DRESSINGS) ×3 IMPLANT
MARKER SKIN DUAL TIP RULER LAB (MISCELLANEOUS) ×3 IMPLANT
NDL HYPO 25X1 1.5 SAFETY (NEEDLE) ×1 IMPLANT
NEEDLE HYPO 25X1 1.5 SAFETY (NEEDLE) ×3 IMPLANT
PACK BASIN DAY SURGERY FS (CUSTOM PROCEDURE TRAY) ×3 IMPLANT
PENCIL BUTTON HOLSTER BLD 10FT (ELECTRODE) ×3 IMPLANT
SLEEVE SCD COMPRESS KNEE MED (MISCELLANEOUS) IMPLANT
SUT MNCRL AB 4-0 PS2 18 (SUTURE) ×3 IMPLANT
SUT MON AB 4-0 PC3 18 (SUTURE) ×3 IMPLANT
SUT VIC AB 3-0 SH 27 (SUTURE) ×3
SUT VIC AB 3-0 SH 27X BRD (SUTURE) ×1 IMPLANT
SYR CONTROL 10ML LL (SYRINGE) ×3 IMPLANT
TOWEL OR 17X24 6PK STRL BLUE (TOWEL DISPOSABLE) ×3 IMPLANT
TOWEL OR NON WOVEN STRL DISP B (DISPOSABLE) ×3 IMPLANT

## 2014-10-05 NOTE — H&P (Signed)
Laura Li is an 74 y.o. female.   Chief Complaint: breast cancer HPI: 23 yof who has undergone treatment for breast cancer and is done with chemo. She would like port removed..    Past Medical History  Diagnosis Date  . OA (osteoarthritis)   . History of DVT (deep vein thrombosis)   . Hyperlipidemia   . Hypertension   . Hypothyroidism   . HX: breast cancer     melanoma  . Melanoma   . Eczema   . Arthritis   . Esophageal stricture   . GERD (gastroesophageal reflux disease)   . PONV (postoperative nausea and vomiting)   . Wears glasses   . Depression     states no depression  . Chronic kidney disease     some renal impairment from meds  . Headache   . Anemia     from medicaions  . Heart murmur     states it is benign told 30-40 years ago  . Pneumothorax on right 06/15/2014    Past Surgical History  Procedure Laterality Date  . Appendectomy  1967  . Melanoma excision  2004    right side of face  . 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 Total abdominal hysterectomy  1992    including ovaries  . Tonsillectomy and adenoidectomy  1949  . Lipoma excision  1978    right breast  . Mastectomy  1998    right-nodes out  . Breast lumpectomy      left breast-benign  . Breast surgery  1977    removal of calcified milk gland right breast  . Colonoscopy    . Radioactive seed guided mastectomy with axillary sentinel lymph node biopsy Left 05/05/2014    Procedure: RADIOACTIVE SEED GUIDED LEFT LUMPECTOMY WITH AXILLARY SENTINEL LYMPH NODE BIOPSY;  Surgeon: Rolm Bookbinder, MD;  Location: [facility];  Service: General;  Laterality: Left;  . Chest tube insertion  06/15/2014  . Portacath placement Left 06/15/2014    Procedure: INSERTION PORT-A-CATH;  Surgeon: Rolm Bookbinder, MD;  Location: [facility];  Service: General;  Laterality: Left;  . Chest tube insertion Bilateral 06/15/2014    Procedure: CHEST TUBE INSERTION;  Surgeon: Rolm Bookbinder, MD;  Location:  [facility] OR;  Service: General;  Laterality: Bilateral;    Family History  Problem Relation Age of Onset  . Heart disease Father 90    smoker  . Endometrial cancer Mother 58  . Breast cancer Cousin     x 3 cousins with breast cancer (one at 61, one at 39 and one at 77)  . Lung cancer Paternal Aunt     smoker  . Brain cancer Maternal Aunt   . Colon cancer Neg Hx   . Diabetes Maternal Aunt     ???  . Breast cancer Maternal Aunt 51   Social History:  reports that she quit smoking about 45 years ago. Her smoking use included Cigarettes. She has a 2.25 pack-year smoking history. She has never used smokeless tobacco. She reports that she drinks alcohol. She reports that she does not use illicit drugs.  Allergies:  Allergies  Allergen Reactions  . Dyazide [Hydrochlorothiazide W-Triamterene]     Presumption: drug induced vasculitis  . Niacin Other (See Comments)    flushing  . Penicillins Diarrhea and Nausea And Vomiting    Medications Prior to Admission  Medication Sig Dispense Refill  . acetaminophen (TYLENOL) 500 MG tablet Take 500 mg by mouth  2 (two) times daily as needed for mild pain.     [name] atorvastatin (LIPITOR) 20 MG tablet Take 1 tablet (20 mg total) by mouth daily. (Patient taking differently: Take 10 mg by mouth daily. ) 90 tablet 3  . Calcium-Vitamin D (CALTRATE 600 PLUS-VIT D PO) Take 1 tablet by mouth 2 (two) times daily.     . Carboxymethylcellulose Sodium (REFRESH LIQUIGEL OP) Apply 1 drop to eye at bedtime as needed (dry eyes).    [name] esomeprazole (NEXIUM) 40 MG capsule TAKE 1 CAPSULE DAILY (Patient taking differently: Take 40 mg by mouth daily. TAKE 1 CAPSULE DAILY) 90 capsule 3  . ipratropium (ATROVENT) 0.03 % nasal spray Place 2 sprays into the nose every 12 (twelve) hours. 180 mL 4  . levothyroxine (SYNTHROID, LEVOTHROID) 75 MCG tablet Take 1 tablet (75 mcg total) by mouth daily. 90 tablet 2  . LORazepam (ATIVAN) 0.5 MG tablet 1/2 to 1 tab po q 8 hours prn 30 tablet 1  .  nadolol (CORGARD) 20 MG tablet Take 0.5 tablets (10 mg total) by mouth daily. 90 tablet 1  . venlafaxine XR (EFFEXOR-XR) 150 MG 24 hr capsule Take 1 capsule (150 mg total) by mouth daily. 180 capsule 0  . docusate sodium (COLACE) 100 MG capsule Take 1 capsule (100 mg total) by mouth 2 (two) times daily. 40 capsule 0  . fexofenadine (ALLEGRA) 180 MG tablet Take 180 mg by mouth daily as needed for allergies.     [name] lidocaine-prilocaine (EMLA) cream Apply to affected area once 30 g 3    No results found for this or any previous visit (from the past 48 hour(s)). No results found.  ROS negative Physical Exam  Physical Exam Rolm Bookbinder MD; 04/15/2014 11:51 AM) Chest and Lung Exam Chest and lung exam reveals -on auscultation, normal breath sounds, no adventitious sounds and normal vocal resonance. Breast Nipples-No Discharge. Breast - Left-Lumpectomy scar. Breast - Right-Mastectomy scar. Breast Lump-No Palpable Breast Mass. Cardiovascular Cardiovascular examination reveals -normal heart sounds, regular rate and rhythm with no murmurs.    Assessment/Plan Port removal  [facility] 10/05/2014, 8:17 AM

## 2014-10-05 NOTE — Anesthesia Postprocedure Evaluation (Signed)
  Anesthesia Post-op Note  Patient: Laura Li  Procedure(s) Performed: Procedure(s): REMOVAL PORT-A-CATH (N/A)  Patient Location: PACU  Anesthesia Type: MAC  Level of Consciousness: awake and alert   Airway and Oxygen Therapy: Patient Spontanous Breathing  Post-op Pain: Controlled  Post-op Assessment: Post-op Vital signs reviewed, Patient's Cardiovascular Status Stable and Respiratory Function Stable  Post-op Vital Signs: Reviewed  Filed Vitals:   10/05/14 0929  BP: 121/52  Pulse: 65  Temp: 36.4 C  Resp: 14    Complications: No apparent anesthesia complications

## 2014-10-05 NOTE — Discharge Instructions (Signed)
Post Anesthesia Home Care Instructions  Activity: Get plenty of rest for the remainder of the day. A responsible adult should stay with you for 24 hours following the procedure.  For the next 24 hours, DO NOT: -Drive a car -Paediatric nurse -Drink alcoholic beverages -Take any medication unless instructed by your physician -Make any legal decisions or sign important papers.  Meals: Start with liquid foods such as gelatin or soup. Progress to regular foods as tolerated. Avoid greasy, spicy, heavy foods. If nausea and/or vomiting occur, drink only clear liquids until the nausea and/or vomiting subsides. Call your physician if vomiting continues.  Special Instructions/Symptoms: Your throat may feel dry or sore from the anesthesia or the breathing tube placed in your throat during surgery. If this causes discomfort, gargle with warm salt water. The discomfort should disappear within 24 hours.  If you had a scopolamine patch placed behind your ear for the management of post- operative nausea and/or vomiting:  1. The medication in the patch is effective for 72 hours, after which it should be removed.  Wrap patch in a tissue and discard in the trash. Wash hands thoroughly with soap and water. 2. You may remove the patch earlier than 72 hours if you experience unpleasant side effects which may include dry mouth, dizziness or visual disturbances. 3. Avoid touching the patch. Wash your hands with soap and water after contact with the patch.        PORT-A-CATH: POST OP INSTRUCTIONS  Always review your discharge instruction sheet given to you by the facility where your surgery was performed.   1. A prescription for pain medication may be given to you upon discharge. Take your pain medication as prescribed, if needed. If narcotic pain medicine is not needed, then you make take acetaminophen (Tylenol) or ibuprofen (Advil) as needed.  2. Take your usually prescribed medications unless otherwise  directed. 3. If you need a refill on your pain medication, please contact our office. All narcotic pain medicine now requires a paper prescription.  Phoned in and fax refills are no longer allowed by law.  Prescriptions will not be filled after 5 pm or on weekends.  4. You should follow a light diet for the remainder of the day after your procedure. 5. Most patients will experience some mild swelling and/or bruising in the area of the incision. It may take several days to resolve. 6. It is common to experience some constipation if taking pain medication after surgery. Increasing fluid intake and taking a stool softener (such as Colace) will usually help or prevent this problem from occurring. A mild laxative (Milk of Magnesia or Miralax) should be taken according to package directions if there are no bowel movements after 48 hours.  7. Unless discharge instructions indicate otherwise, you may remove your bandages 48 hours after surgery, and you may shower at that time. You may have steri-strips (small white skin tapes) in place directly over the incision.  These strips should be left on the skin for 7-10 days.  If your surgeon used Dermabond (skin glue) on the incision, you may shower in 24 hours.  The glue will flake off over the next 2-3 weeks.  8. ACTIVITIES:  Limit activity involving your arms for the next 72 hours. Do no strenuous exercise or activity for 1 week. You may drive when you are no longer taking prescription pain medication, you can comfortably wear a seatbelt, and you can maneuver your car. 10.You may need to see your doctor in  the office for a follow-up appointment.  Please       check with your doctor.  11.When you receive a new Port-a-Cath, you will get a product guide and        ID card.  Please keep them in case you need them.  WHEN TO CALL YOUR DOCTOR [phone]): 1. Fever over 101.0 2. Chills 3. Continued bleeding from incision 4. Increased redness and tenderness at the  site 5. Shortness of breath, difficulty breathing   The clinic staff is available to answer your questions during regular business hours. Please dont hesitate to call and ask to speak to one of the nurses or medical assistants for clinical concerns. If you have a medical emergency, go to the nearest emergency room or call 911.  A surgeon from [facility] is always on call at the hospital.     For further information, please visit [email or web address]

## 2014-10-05 NOTE — Op Note (Signed)
Preoperative diagnosis: breast cancer, no longer needs venous access Postoperative diagnosis: same as above Procedure: port removal Surgeon: Dr Serita Grammes  EBL: minimal Anesthesia local mac Specimens none Drains none Complications none Sponge count correct dispo to recovery stable  Indications: This is a 18 yof presents for port removal after undergoing chemotherapy for breast cancer.  Procedure: after informed consent was obtained she was taken to the or.  She was placed under MAC.  She was prepped and draped in the standard sterile surgical fashion. Timeout was performed.  I infiltrated marcaine and lidocaine in region of port and old scar. I reentered the old incision. The port and line were removed in their entirety.  Hemostasis was observed.  I then closed with 3-0 vicryl, 4-0 monocryl and glue. She tolerated well and was transferred to recovery stable.

## 2014-10-05 NOTE — Interval H&P Note (Signed)
History and Physical Interval Note:  7/47/3403 7:09 AM  Laura Li  has presented today for surgery, with the diagnosis of LEFT BREAST CANCER  The various methods of treatment have been discussed with the patient and family. After consideration of risks, benefits and other options for treatment, the patient has consented to  Procedure(s): REMOVAL PORT-A-CATH (N/A) as a surgical intervention .  The patient's history has been reviewed, patient examined, no change in status, stable for surgery.  I have reviewed the patient's chart and labs.  Questions were answered to the patient's satisfaction.     Brandi Tomlinson

## 2014-10-05 NOTE — Transfer of Care (Signed)
Immediate Anesthesia Transfer of Care Note  Patient: Laura Li  Procedure(s) Performed: Procedure(s): REMOVAL PORT-A-CATH (N/A)  Patient Location: PACU  Anesthesia Type:MAC  Level of Consciousness: awake, alert  and oriented  Airway & Oxygen Therapy: Patient Spontanous Breathing  Post-op Assessment: Report given to RN and Post -op Vital signs reviewed and stable  Post vital signs: Reviewed and stable  Last Vitals:  Filed Vitals:   10/05/14 0719  BP: 121/70  Pulse: 69  Temp: 36.4 C  Resp: 20    Complications: No apparent anesthesia complications

## 2014-10-05 NOTE — Anesthesia Preprocedure Evaluation (Addendum)
Anesthesia Evaluation  Patient identified by MRN, date of birth, ID band Patient awake    Reviewed: Allergy & Precautions, H&P , NPO status , Patient's Chart, lab work & pertinent test results, reviewed documented beta blocker date and time   History of Anesthesia Complications (+) PONV  Airway Mallampati: II  TM Distance: >3 FB Neck ROM: Full    Dental no notable dental hx. (+) Teeth Intact, Dental Advisory Given   Pulmonary neg pulmonary ROS, former smoker,  breath sounds clear to auscultation  Pulmonary exam normal       Cardiovascular hypertension, Pt. on medications and Pt. on home beta blockers Rhythm:Regular Rate:Normal     Neuro/Psych  Headaches, Depression    GI/Hepatic Neg liver ROS, GERD-  Medicated and Controlled,  Endo/Other  Hypothyroidism   Renal/GU Renal InsufficiencyRenal disease  negative genitourinary   Musculoskeletal  (+) Arthritis -, Osteoarthritis,    Abdominal   Peds  Hematology negative hematology ROS (+)   Anesthesia Other Findings   Reproductive/Obstetrics negative OB ROS                           Anesthesia Physical Anesthesia Plan  ASA: II  Anesthesia Plan: MAC   Post-op Pain Management:    Induction: Intravenous  Airway Management Planned: Simple Face Mask  Additional Equipment:   Intra-op Plan:   Post-operative Plan: Extubation in OR  Informed Consent: I have reviewed the patients History and Physical, chart, labs and discussed the procedure including the risks, benefits and alternatives for the proposed anesthesia with the patient or authorized representative who has indicated his/her understanding and acceptance.   Dental advisory given  Plan Discussed with: CRNA  Anesthesia Plan Comments:        Anesthesia Quick Evaluation

## 2014-10-06 ENCOUNTER — Encounter (HOSPITAL_BASED_OUTPATIENT_CLINIC_OR_DEPARTMENT_OTHER): Payer: Self-pay | Admitting: General Surgery

## 2014-10-11 ENCOUNTER — Other Ambulatory Visit (HOSPITAL_BASED_OUTPATIENT_CLINIC_OR_DEPARTMENT_OTHER): Payer: Medicare Other

## 2014-10-11 ENCOUNTER — Other Ambulatory Visit: Payer: Self-pay | Admitting: Oncology

## 2014-10-11 ENCOUNTER — Ambulatory Visit (HOSPITAL_BASED_OUTPATIENT_CLINIC_OR_DEPARTMENT_OTHER): Payer: Medicare Other | Admitting: Oncology

## 2014-10-11 ENCOUNTER — Encounter: Payer: Self-pay | Admitting: *Deleted

## 2014-10-11 VITALS — BP 126/60 | HR 90 | Temp 97.7°F | Resp 18 | Ht 66.0 in | Wt 141.8 lb

## 2014-10-11 DIAGNOSIS — C50812 Malignant neoplasm of overlapping sites of left female breast: Secondary | ICD-10-CM

## 2014-10-11 DIAGNOSIS — Z853 Personal history of malignant neoplasm of breast: Secondary | ICD-10-CM

## 2014-10-11 DIAGNOSIS — Z171 Estrogen receptor negative status [ER-]: Secondary | ICD-10-CM

## 2014-10-11 DIAGNOSIS — C50912 Malignant neoplasm of unspecified site of left female breast: Secondary | ICD-10-CM

## 2014-10-11 DIAGNOSIS — D630 Anemia in neoplastic disease: Secondary | ICD-10-CM

## 2014-10-11 DIAGNOSIS — M8580 Other specified disorders of bone density and structure, unspecified site: Secondary | ICD-10-CM

## 2014-10-11 DIAGNOSIS — C50911 Malignant neoplasm of unspecified site of right female breast: Secondary | ICD-10-CM

## 2014-10-11 LAB — COMPREHENSIVE METABOLIC PANEL (CC13)
ALT: 16 U/L (ref 0–55)
ANION GAP: 7 meq/L (ref 3–11)
AST: 22 U/L (ref 5–34)
Albumin: 4.1 g/dL (ref 3.5–5.0)
Alkaline Phosphatase: 85 U/L (ref 40–150)
BILIRUBIN TOTAL: 0.51 mg/dL (ref 0.20–1.20)
BUN: 11.6 mg/dL (ref 7.0–26.0)
CALCIUM: 9.8 mg/dL (ref 8.4–10.4)
CO2: 29 mEq/L (ref 22–29)
Chloride: 106 mEq/L (ref 98–109)
Creatinine: 1 mg/dL (ref 0.6–1.1)
EGFR: 55 mL/min/{1.73_m2} — AB (ref >90)
GLUCOSE: 92 mg/dL (ref 70–140)
POTASSIUM: 4.1 meq/L (ref 3.5–5.1)
SODIUM: 141 meq/L (ref 136–145)
Total Protein: 7.1 g/dL (ref 6.4–8.3)

## 2014-10-11 LAB — CBC WITH DIFFERENTIAL/PLATELET
BASO%: 0.6 % (ref 0.0–2.0)
Basophils Absolute: 0 10*3/uL (ref 0.0–0.1)
EOS%: 2.1 % (ref 0.0–7.0)
Eosinophils Absolute: 0.1 10*3/uL (ref 0.0–0.5)
HEMATOCRIT: 31.6 % — AB (ref 34.8–46.6)
HEMOGLOBIN: 10.7 g/dL — AB (ref 11.6–15.9)
LYMPH#: 1.2 10*3/uL (ref 0.9–3.3)
LYMPH%: 19.1 % (ref 14.0–49.7)
MCH: 33.7 pg (ref 25.1–34.0)
MCHC: 34 g/dL (ref 31.5–36.0)
MCV: 99.2 fL (ref 79.5–101.0)
MONO#: 0.5 10*3/uL (ref 0.1–0.9)
MONO%: 8.4 % (ref 0.0–14.0)
NEUT%: 69.8 % (ref 38.4–76.8)
NEUTROS ABS: 4.4 10*3/uL (ref 1.5–6.5)
PLATELETS: 293 10*3/uL (ref 145–400)
RBC: 3.18 10*6/uL — ABNORMAL LOW (ref 3.70–5.45)
RDW: 19.6 % — AB (ref 11.2–14.5)
WBC: 6.3 10*3/uL (ref 3.9–10.3)

## 2014-10-11 NOTE — Progress Notes (Signed)
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 Non-of the 25 lymph nodes removed from the right axilla were involved. The tumor was estrogen and progesterone receptor positive, HER-2 not amplified. She was treated adjuvantly with doxorubicin and cyclophosphamide 4, then received 5 years of antiestrogen therapy (initially tamoxifen, later letrozole). There has been no evidence of disease recurrence on the right.  More recently, Laura Li had routine left screening [facility] (74 years old). This showed a suspicious area of architectural distortion in the inferior portion of the left breast. On 76/48/5462 Kiela underwent left diagnostic mammography with ultrasonography. Spot compression views confirmed the presence 7 area of distortion in the lower central left breast, which was not palpable by physical exam (there was a prior biopsy scar in the upper outer quadrant of the left breast). Ultrasound confirmed a hypoechoic spiculated mass at the 6:00 location in the left breast, measuring maximally 1.3 cm. Sonography of the left axilla was negative.  Biopsy of the left breast mass in question 120 10/29/2014 showed ([id number]) an invasive breast cancer with squamous features. It was estrogen and progesterone receptor negative. There was no HER-2 amplification, the signals ratio being 0.82 and the number per cell 1.80.  The MIB-1 was 10%.  The patient has met with Dr. Donne Hazel in surgery and Dr. Pablo Ledger in radiation oncology. The patient understands there is no survival difference between lumpectomy and radiation compared with mastectomy. The patient will need sentinel lymph node sampling with either of those procedures. If she does have a lumpectomy, she will benefit from adjuvant radiation. The patient has been set up for genetics testing and this may affect her ultimate surgical decision.  Her subsequent history is as detailed below  INTERVAL HISTORY: Laura Li returns today for follow-up of her breast cancer, accompanied by her daughter, [name]. Since her last visit with me Danton Clap completed her chemotherapy treatments. She is slowly getting back to normal. The biggest problem she is having is continuing epiphora. She was until vertex eyedrops, which did not prevent the problem. She has seen Dr. Barbarann Ehlers me or one of his associates and was started on loteprednol/Lotemax eyedrops 4 times a day. She is hoping this will take care of the problem. She does not have a return appointment there but tells me she will be making 1 in the next 2-3 weeks if the problem persists.  REVIEW OF SYSTEMS: Otherwise [name] mostly stays around the house. There are stairs and she goes up and down she says but she does not otherwise exercise. She does not have to take a break while going upstairs: She gets all the way to the top without stopping. She denies unusual headaches, visual changes, nausea, or vomiting. There have been no mouth sores, thrush or difficulty swallowing. She denies cough phlegm production pleurisy or shortness of breath. He has been no change in bowel or bladder habits. In general she feels well. A detailed review of systems was otherwise noncontributory  PAST MEDICAL HISTORY: Past Medical History  Diagnosis Date  . OA (osteoarthritis)   . History of DVT (deep vein thrombosis)   . Hyperlipidemia   . Hypertension   .  Hypothyroidism   . HX: breast cancer     melanoma  . Melanoma   . Eczema   . Arthritis   . Esophageal stricture   . GERD (gastroesophageal reflux disease)   . PONV (postoperative nausea and vomiting)   . Wears glasses   . Depression     states no depression  . Chronic kidney disease     some renal impairment from meds  . Headache   . Anemia     from medicaions  . Heart murmur     states it is benign told 30-40 years ago  . Pneumothorax on right 06/15/2014    PAST SURGICAL HISTORY: Past Surgical History  Procedure Laterality Date  . Appendectomy  1967  . Melanoma excision  2004    right side of face  . 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 Total abdominal hysterectomy  1992    including ovaries  . Tonsillectomy and adenoidectomy  1949  . Lipoma excision  1978    right breast  . Mastectomy  1998    right-nodes out  . Breast lumpectomy      left breast-benign  . Breast surgery  1977    removal of calcified milk gland right breast  . Colonoscopy    . Radioactive seed guided mastectomy with axillary sentinel lymph node biopsy Left 05/05/2014    Procedure: RADIOACTIVE SEED GUIDED LEFT LUMPECTOMY WITH AXILLARY SENTINEL LYMPH NODE BIOPSY;  Surgeon: Rolm Bookbinder, MD;  Location: [place];  Service: General;  Laterality: Left;  . Chest tube insertion  06/15/2014  . Portacath placement Left 06/15/2014    Procedure: INSERTION PORT-A-CATH;  Surgeon: Rolm Bookbinder, MD;  Location: [place];  Service: General;  Laterality: Left;  . Chest tube insertion Bilateral 06/15/2014    Procedure: CHEST TUBE INSERTION;  Surgeon: Rolm Bookbinder, MD;  Location: [facility];  Service: General;  Laterality: Bilateral;  . Port-a-cath removal N/A 10/05/2014    Procedure: REMOVAL PORT-A-CATH;  Surgeon: Rolm Bookbinder, MD;  Location: [facility];  Service: General;  Laterality: N/A;    FAMILY HISTORY Family History  Problem Relation Age of Onset  . Heart  disease Father 59    smoker  . Endometrial cancer Mother 44  . Breast cancer Cousin     x 3 cousins with breast cancer (one at 66, one at 15 and one at 69)  . Lung cancer Paternal Aunt     smoker  . Brain cancer Maternal Aunt   . Colon cancer Neg Hx   . Diabetes Maternal Aunt     ???  . Breast cancer Maternal Aunt 51  The patient's father died at the age of 49 from a myocardial infarction. The patient's mother died at the age of 76 from metastatic endometrial cancer which had been diagnosed 2 years before. The patient had no brothers, one sister. The patient's mother's sister was diagnosed with breast cancer but the patient does not know at what age. A second maternal aunt was diagnosed with brain cancer. The patient has 3 cousins on her mother's side diagnosed with breast cancer, one of them under the age of 13. On the father's side there is a history of lung and cervical cancer  GYNECOLOGIC HISTORY:  No LMP  recorded. Patient has had a hysterectomy. Menarche age 77, first live birth age 22. The patient is GX P1. She underwent hysterectomy with bilateral salpingo-oophorectomy in 1991. She used hormone replacement for approximately 7 years until 1998, when she had her right-sided breast cancer. She used oral contraceptives for approximately one year with no complications. 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 She is widowed and lives alone with no pets. Her main hobby is painting. Her daughter Faith Rogue lives in [place] and is also a homemaker. The patient has 2 grandchildren. She attends wholly [employer] [employer]. The patient's daughter [name] is her healthcare power of attorney. [name] can be reached at [phone]   HEALTH MAINTENANCE: History  Substance Use Topics  . Smoking status: Former Smoker  -- 0.75 packs/day for 3 years    Types: Cigarettes    Quit date: 03/11/1969  . Smokeless tobacco: Never Used  . Alcohol Use: 0.0 oz/week    0 Standard drinks or equivalent per week     Comment: occasional wine     Colonoscopy: 20/10/[facility] Past[facility]  VHQ:IONGEX post hysterectomy  Bone density:2014/osteopenia  Lipid panel:  Allergies  Allergen Reactions  . Dyazide [Hydrochlorothiazide W-Triamterene]     Presumption: drug induced vasculitis  . Niacin Other (See Comments)    flushing  . Penicillins Diarrhea and Nausea And Vomiting    Current Outpatient Prescriptions  Medication Sig Dispense Refill  . acetaminophen (TYLENOL) 500 MG tablet Take 500 mg by mouth 2 (two) times daily as needed for mild pain.     [name] atorvastatin (LIPITOR) 20 MG tablet Take 1 tablet (20 mg total) by mouth daily. (Patient taking differently: Take 10 mg by mouth daily. ) 90 tablet 3  . Calcium-Vitamin D (CALTRATE 600 PLUS-VIT D PO) Take 1 tablet by mouth 2 (two) times daily.     . Carboxymethylcellulose Sodium (REFRESH LIQUIGEL OP) Apply 1 drop to eye at bedtime as needed (dry eyes).    [name] docusate sodium (COLACE) 100 MG capsule Take 1 capsule (100 mg total) by mouth 2 (two) times daily. 40 capsule 0  . esomeprazole (NEXIUM) 40 MG capsule TAKE 1 CAPSULE DAILY (Patient taking differently: Take 40 mg by mouth daily. TAKE 1 CAPSULE DAILY) 90 capsule 3  . fexofenadine (ALLEGRA) 180 MG tablet Take 180 mg by mouth daily as needed for allergies.     [name] ipratropium (ATROVENT) 0.03 % nasal spray Place 2 sprays into the nose every 12 (twelve) hours. 180 mL 4  . levothyroxine (SYNTHROID, LEVOTHROID) 75 MCG tablet Take 1 tablet (75 mcg total) by mouth daily. 90 tablet 2  . lidocaine-prilocaine (EMLA) cream Apply to affected area once 30 g 3  . LORazepam (ATIVAN) 0.5 MG tablet 1/2 to 1 tab po q 8 hours prn 30 tablet 1  . nadolol (CORGARD) 20 MG tablet Take 0.5 tablets (10 mg total) by mouth daily. 90 tablet 1  . venlafaxine XR  (EFFEXOR-XR) 150 MG 24 hr capsule Take 1 capsule (150 mg total) by mouth daily. 180 capsule 0   No current facility-administered medications for this visit.    OBJECTIVE: Older white woman in no acute distress  Filed Vitals:   10/11/14 0917  BP: 126/60  Pulse: 90  Temp: 97.7 F (36.5 C)  Resp: 18  Body mass index is 22.9 kg/(m^2).    ECOG FS:1 - Symptomatic but completely ambulatory  Sclerae unicteric, pupils round and equal; scalp is glabrous Oropharynx clear and moist-- no thrush or other lesions No cervical or supraclavicular adenopathy Lungs no rales or rhonchi Heart regular rate and rhythm Abd soft, nontender, positive bowel sounds MSK no focal spinal tenderness, no upper extremity lymphedema Neuro: nonfocal, well oriented, positive affect Breasts: The right breast is status post remote mastectomy. There is no evidence of local recurrence. The right axilla is benign Left breast is status post recent lumpectomy. The incisions are healing very nicely. There is no evidence of local recurrence. The left axilla is benign   LAB RESULTS:  CMP     Component Value Date/Time   NA 134* 09/21/2014 1111   NA 138 06/14/2014 1025   K 4.0 09/21/2014 1111   K 4.1 06/14/2014 1025   CL 100 06/14/2014 1025   CO2 28 09/21/2014 1111   CO2 33* 06/14/2014 1025   GLUCOSE 96 09/21/2014 1111   GLUCOSE 79 06/14/2014 1025   GLUCOSE 88 02/06/2006 1059   BUN 16.0 09/21/2014 1111   BUN 16 06/14/2014 1025   CREATININE 0.9 09/21/2014 1111   CREATININE 0.97 06/14/2014 1025   CALCIUM 9.8 09/21/2014 1111   CALCIUM 9.9 06/14/2014 1025   PROT 6.9 09/21/2014 1111   PROT 8.5* 12/21/2013 1004   ALBUMIN 4.0 09/21/2014 1111   ALBUMIN 4.2 12/21/2013 1004   AST 23 09/21/2014 1111   AST 24 12/21/2013 1004   ALT 25 09/21/2014 1111   ALT 19 12/21/2013 1004   ALKPHOS 99 09/21/2014 1111   ALKPHOS 64 12/21/2013 1004   BILITOT 0.60 09/21/2014 1111   BILITOT 0.8 12/21/2013 1004   GFRNONAA 56* 06/14/2014  1025   GFRAA 65* 06/14/2014 1025    INo results found for: SPEP, UPEP  Lab Results  Component Value Date   WBC 6.3 10/11/2014   NEUTROABS 4.4 10/11/2014   HGB 10.7* 10/11/2014   HCT 31.6* 10/11/2014   MCV 99.2 10/11/2014   PLT 293 10/11/2014      Chemistry      Component Value Date/Time   NA 134* 09/21/2014 1111   NA 138 06/14/2014 1025   K 4.0 09/21/2014 1111   K 4.1 06/14/2014 1025   CL 100 06/14/2014 1025   CO2 28 09/21/2014 1111   CO2 33* 06/14/2014 1025   BUN 16.0 09/21/2014 1111   BUN 16 06/14/2014 1025   CREATININE 0.9 09/21/2014 1111   CREATININE 0.97 06/14/2014 1025      Component Value Date/Time   CALCIUM 9.8 09/21/2014 1111   CALCIUM 9.9 06/14/2014 1025   ALKPHOS 99 09/21/2014 1111   ALKPHOS 64 12/21/2013 1004   AST 23 09/21/2014 1111   AST 24 12/21/2013 1004   ALT 25 09/21/2014 1111   ALT 19 12/21/2013 1004   BILITOT 0.60 09/21/2014 1111   BILITOT 0.8 12/21/2013 1004       No results found for: LABCA2  No components found for: LABCA125  No results for input(s): INR in the last 168 hours.  Urinalysis    Component Value Date/Time   COLORURINE YELLOW 05/17/2013 1330   APPEARANCEUR CLEAR 05/17/2013 1330   LABSPEC 1.005 08/09/2014 1052   LABSPEC 1.025 05/17/2013 1330   PHURINE 7.0 08/09/2014 1052   PHURINE 5.5 05/17/2013 1330   GLUCOSEU Negative 08/09/2014 1052   GLUCOSEU NEGATIVE 05/17/2013 1330   HGBUR Negative 08/09/2014 1052   HGBUR NEGATIVE 05/17/2013  1330   BILIRUBINUR Negative 08/09/2014 1052   BILIRUBINUR n 06/21/2013 1115   BILIRUBINUR NEGATIVE 05/17/2013 1330   KETONESUR Negative 08/09/2014 1052   KETONESUR NEGATIVE 05/17/2013 1330   PROTEINUR Negative 08/09/2014 1052   PROTEINUR n 06/21/2013 1115   PROTEINUR NEGATIVE 05/17/2013 1330   UROBILINOGEN 0.2 08/09/2014 1052   UROBILINOGEN 0.2 06/21/2013 1115   UROBILINOGEN 0.2 05/17/2013 1330   NITRITE Negative 08/09/2014 1052   NITRITE n 06/21/2013 1115   NITRITE NEGATIVE  05/17/2013 1330   LEUKOCYTESUR Negative 08/09/2014 1052   LEUKOCYTESUR Trace 06/21/2013 1115    STUDIES: No results found.  ASSESSMENT: 74 y.o. [place] woman  (1) status post right modified radical mastectomy November 1998 for a pT2 pN0, stage IIA invasive ductal carcinoma, grade 2, estrogen and progesterone receptor positive, HER-2 not amplified  (a) adjuvant chemotherapy consisted of doxorubicin and cyclophosphamide 4  (b) adjuvant anti-estrogens consisted of tamoxifen, then letrozole, for a total of 5 years  (2) status post left breast lower outer quadrant biopsy 04/07/2014 for a clinical T1c N0,stage IA  invasive ductal carcinoma, with squamous features, estrogen and progesterone receptor negative, HER-2 not amplified, with an MIB-1 of 10%   (3) left lumpectomy and sentinel lymph node biopsy 05/05/2014 showed aT1c pN0, stage IA invasive ductal carcinoma, grade 1, triple negative, with close but negative margins.   (4) Mammaprint classifies the tumor as basal like, high risk, and predicts a 30% chance of recurrence within 10 years with local treatment only.   (5) start of treatment delayed because of bilateral pneumothoraces after port placement. Adjuvant chemotherapy fiinally started 07/11/2014 consisting of carboplatin and docetaxel given every 21 days 4, completed 09/13/2014  (6) genetics testing March 2016 showed no deleterious mutation in the [employer] panel  [[facility]] including sequencing and rearrangement analysis for the following 24 genes:ATM, BARD1, BRCA1, BRCA2, BRIP1, CDH1, CHEK2, EPCAM, MLH1, MRE11A, MSH2, MSH6, MUTYH, NBN, NF1, PALB2, PMS2, PTEN, RAD50, RAD51C, RAD51D, SMARCA4, STK11, and TP53.   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    PLAN: Jozlynn continues to have significant epiphora. If her current medications do not work as expected she may need to have cannulization of her draining tear  ducts.  Otherwise she is recovering well from her treatments. Of course she is a long ways away from getting her hair back. She is still fatigued. I have encouraged her to exercise but she was not exercising before treatment and is not very motivated to do it right now. Generally though she feels well and is working up to her usual functional status.  I'm going to see her again in mid October at which point we may be able to start routine follow-up. She knows to call for any problems that may develop before that visit.  Chauncey Cruel, MD   10/11/2014 9:38 AM

## 2014-10-13 ENCOUNTER — Telehealth: Payer: Self-pay | Admitting: Oncology

## 2014-10-13 NOTE — Telephone Encounter (Signed)
Confirmed appointment for Oct.

## 2014-10-19 ENCOUNTER — Ambulatory Visit: Payer: Medicare Other | Admitting: Radiation Oncology

## 2014-10-20 ENCOUNTER — Encounter: Payer: Self-pay | Admitting: Radiation Oncology

## 2014-10-20 ENCOUNTER — Ambulatory Visit
Admission: RE | Admit: 2014-10-20 | Discharge: 2014-10-20 | Disposition: A | Discharge status: Home / Self Care | Payer: Medicare Other | Source: Ambulatory Visit | Attending: Radiation Oncology | Admitting: Radiation Oncology

## 2014-10-20 DIAGNOSIS — C50912 Malignant neoplasm of unspecified site of left female breast: Secondary | ICD-10-CM | POA: Diagnosis present

## 2014-10-20 DIAGNOSIS — C50512 Malignant neoplasm of lower-outer quadrant of left female breast: Secondary | ICD-10-CM

## 2014-10-20 NOTE — Progress Notes (Signed)
Name: Laura Li   MRN: [id number]  Date:  10/20/2014  DOB: Mar 29, 1940  Status:Outpatient   DIAGNOSIS: Cancer of the left female breast  CONSENT VERIFIED: Yes SET UP: The patient will be setup in the supine position.  IMMOBILIZATION:  The following immobilization was used:Custom Moldable Pillow, breast board.  NARRATIVE: Ms. Dina was brought to the [facility].  Identity was confirmed.  All relevant records and images related to the planned course of therapy were reviewed.  Then, the patient was positioned in a stable reproducible clinical set-up for radiation therapy.  Wires were placed to delineate the clinical extent of breast tissue. A wire was placed on the scar as well.  CT images were obtained.  An isocenter was placed. Skin markings were placed.  The position of the heart was then analyzed.  Due to the proximity of the heart to the chest wall, I felt she would benefit from deep inspiration breath hold for cardiac sparing.  She was then coached and rescanned in the breath hold position.  Acceptable cardiac sparing was achieved. The CT images were loaded into the planning software where the target and avoidance structures were contoured.  The radiation prescription was entered and confirmed. The patient was discharged in stable condition and tolerated simulation well.    TREATMENT PLANNING NOTE/3D Simulation Note Treatment planning then occurred. I have requested : MLC's, isodose plan, basic dose calculation  3D simulation was performed.  I personally designed and supervised the construction of 3 medically necessary complex treatment devices in the form of MLCs which will be used for beam modification and to protect critical structures including the heart and lung as well as the immobilization device which is necessary for reproducible set up.  I have requested a dose volume histogram of the heart, lung and tumor cavity.   If she develops any further questions or  concerns in regards to her treatment, she has been encouraged to contact Dr. Pablo Ledger.  This document serves as a record of services personally performed by Thea Silversmith , MD. It was created on her behalf by Lenn Cal, a trained medical scribe. The creation of this record is based on the scribe's personal observations and the provider's statements to them. This document has been checked and approved by the attending provider.   ------------------------------------------------  Thea Silversmith, MD

## 2014-10-21 ENCOUNTER — Telehealth: Payer: Self-pay | Admitting: Family Medicine

## 2014-10-21 MED ORDER — ATORVASTATIN CALCIUM 20 MG PO TABS: 20.0000 mg | ORAL_TABLET | Freq: Every day | ORAL | Status: DC

## 2014-10-21 NOTE — Telephone Encounter (Signed)
Medication refilled

## 2014-10-21 NOTE — Telephone Encounter (Signed)
Refill request for Atorvastatin 10 mg take 1 po qd and a 90 day supply to [facility]

## 2014-10-25 DIAGNOSIS — C50912 Malignant neoplasm of unspecified site of left female breast: Secondary | ICD-10-CM | POA: Diagnosis not present

## 2014-10-26 DIAGNOSIS — C50912 Malignant neoplasm of unspecified site of left female breast: Secondary | ICD-10-CM | POA: Diagnosis not present

## 2014-10-27 ENCOUNTER — Ambulatory Visit
Admission: RE | Admit: 2014-10-27 | Discharge: 2014-10-27 | Disposition: A | Discharge status: Home / Self Care | Payer: Medicare Other | Source: Ambulatory Visit | Attending: Radiation Oncology | Admitting: Radiation Oncology

## 2014-10-27 DIAGNOSIS — C50912 Malignant neoplasm of unspecified site of left female breast: Secondary | ICD-10-CM | POA: Diagnosis not present

## 2014-10-31 ENCOUNTER — Ambulatory Visit
Admission: RE | Admit: 2014-10-31 | Discharge: 2014-10-31 | Disposition: A | Discharge status: Home / Self Care | Payer: Medicare Other | Source: Ambulatory Visit | Attending: Radiation Oncology | Admitting: Radiation Oncology

## 2014-10-31 DIAGNOSIS — C50912 Malignant neoplasm of unspecified site of left female breast: Secondary | ICD-10-CM | POA: Diagnosis not present

## 2014-11-01 ENCOUNTER — Ambulatory Visit
Admission: RE | Admit: 2014-11-01 | Discharge: 2014-11-01 | Disposition: A | Discharge status: Home / Self Care | Payer: Medicare Other | Source: Ambulatory Visit | Attending: Radiation Oncology | Admitting: Radiation Oncology

## 2014-11-01 ENCOUNTER — Encounter: Payer: Self-pay | Admitting: *Deleted

## 2014-11-01 VITALS — BP 121/59 | HR 72 | Temp 97.5°F | Wt 143.4 lb

## 2014-11-01 DIAGNOSIS — C50912 Malignant neoplasm of unspecified site of left female breast: Secondary | ICD-10-CM | POA: Diagnosis not present

## 2014-11-01 MED ORDER — RADIAPLEXRX EX GEL
Freq: Once | CUTANEOUS | Status: AC
  Administered 2014-11-01: 14:00:00 via TOPICAL

## 2014-11-01 MED ORDER — ALRA NON-METALLIC DEODORANT (RAD-ONC)
1.0000 "application " | Freq: Once | TOPICAL | Status: AC
  Administered 2014-11-01: 1 via TOPICAL

## 2014-11-01 NOTE — Progress Notes (Signed)
Pt here for patient teaching.  Pt given Radiation and You booklet, skin care instructions, Alra deodorant and Radiaplex gel. Pt reports they have watched the Radiation Therapy Education video on November 01, 2014.  Reviewed areas of pertinence such as  . Pt able to give teach back of to pat skin, use unscented/gentle soap and drink plenty of water,apply Radiaplex bid, avoid applying anything to skin within 4 hours of treatment, avoid wearing an under wire bra and to use an electric razor if they must shave. Pt demonstrated understanding, will contact nursing with any questions or concerns.  Shown where to apply lotion and times to apply written on skin care sheet.  Routine of clinic reviewed.Teaching initiated.Will review in greater length on tomorrow after treatment.

## 2014-11-01 NOTE — Progress Notes (Signed)
Weekly Management Note Current Dose:  3.6 Gy  Projected Dose: 61 Gy   Narrative:  The patient presents for routine under treatment assessment.  CBCT/MVCT images/Port film x-rays were reviewed.  The chart was checked. No complaints except forgetfulness. RN education performed.  Physical Findings: Weight: 143 lb 6.4 oz (65.046 kg). Unchanged  Impression:  The patient is tolerating radiation.  Plan:  Continue treatment as planned. Start radiaplex.

## 2014-11-02 ENCOUNTER — Ambulatory Visit
Admission: RE | Admit: 2014-11-02 | Discharge: 2014-11-02 | Disposition: A | Discharge status: Home / Self Care | Payer: Medicare Other | Source: Ambulatory Visit | Attending: Radiation Oncology | Admitting: Radiation Oncology

## 2014-11-02 DIAGNOSIS — C50912 Malignant neoplasm of unspecified site of left female breast: Secondary | ICD-10-CM | POA: Diagnosis not present

## 2014-11-03 ENCOUNTER — Ambulatory Visit
Admission: RE | Admit: 2014-11-03 | Discharge: 2014-11-03 | Disposition: A | Discharge status: Home / Self Care | Payer: Medicare Other | Source: Ambulatory Visit | Attending: Radiation Oncology | Admitting: Radiation Oncology

## 2014-11-03 DIAGNOSIS — C50912 Malignant neoplasm of unspecified site of left female breast: Secondary | ICD-10-CM | POA: Diagnosis not present

## 2014-11-04 ENCOUNTER — Telehealth: Payer: Self-pay | Admitting: *Deleted

## 2014-11-04 ENCOUNTER — Ambulatory Visit
Admission: RE | Admit: 2014-11-04 | Discharge: 2014-11-04 | Disposition: A | Discharge status: Home / Self Care | Payer: Medicare Other | Source: Ambulatory Visit | Attending: Radiation Oncology | Admitting: Radiation Oncology

## 2014-11-04 ENCOUNTER — Ambulatory Visit (HOSPITAL_COMMUNITY)
Admission: RE | Admit: 2014-11-04 | Discharge: 2014-11-04 | Disposition: A | Discharge status: Home / Self Care | Payer: Medicare Other | Source: Ambulatory Visit | Attending: Nurse Practitioner | Admitting: Nurse Practitioner

## 2014-11-04 ENCOUNTER — Ambulatory Visit (HOSPITAL_BASED_OUTPATIENT_CLINIC_OR_DEPARTMENT_OTHER): Payer: Medicare Other | Admitting: Nurse Practitioner

## 2014-11-04 ENCOUNTER — Other Ambulatory Visit: Payer: Self-pay | Admitting: *Deleted

## 2014-11-04 VITALS — BP 135/64 | HR 90 | Temp 98.2°F | Resp 18 | Wt 144.9 lb

## 2014-11-04 DIAGNOSIS — C50911 Malignant neoplasm of unspecified site of right female breast: Secondary | ICD-10-CM

## 2014-11-04 DIAGNOSIS — R2240 Localized swelling, mass and lump, unspecified lower limb: Secondary | ICD-10-CM | POA: Diagnosis not present

## 2014-11-04 DIAGNOSIS — C50912 Malignant neoplasm of unspecified site of left female breast: Secondary | ICD-10-CM | POA: Diagnosis not present

## 2014-11-04 DIAGNOSIS — M7989 Other specified soft tissue disorders: Secondary | ICD-10-CM | POA: Insufficient documentation

## 2014-11-04 DIAGNOSIS — Z171 Estrogen receptor negative status [ER-]: Secondary | ICD-10-CM

## 2014-11-04 DIAGNOSIS — L03115 Cellulitis of right lower limb: Secondary | ICD-10-CM

## 2014-11-04 DIAGNOSIS — C50512 Malignant neoplasm of lower-outer quadrant of left female breast: Secondary | ICD-10-CM | POA: Diagnosis not present

## 2014-11-04 DIAGNOSIS — I82549 Chronic embolism and thrombosis of unspecified tibial vein: Secondary | ICD-10-CM | POA: Insufficient documentation

## 2014-11-04 NOTE — Telephone Encounter (Signed)
Error

## 2014-11-04 NOTE — Progress Notes (Signed)
*  Preliminary Results* Right lower extremity venous duplex completed. Right lower extremity is negative for acute deep vein thrombosis. 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 There is no evidence of right Baker's cyst.  Preliminary results discussed with Retta Mac, NP.  11/04/2014 4:35 PM  Maudry Mayhew, RVT, RDCS, RDMS

## 2014-11-04 NOTE — Telephone Encounter (Signed)
PT. HAS HAD RIGHT ANKLE PAIN WHEN WALKING FOR TWO TO THREE DAYS. THERE IS A DIME SIZE PINK KNOT TWO TO THREE INCHES ABOVE HER RIGHT ANKLE. THERE IS NO INCREASED WARMTH OR SWELLING. PT. TO SEE CYNDEE BACON,NP BEFORE RADIATION TODAY. POF TO SCHEDULING.

## 2014-11-05 ENCOUNTER — Encounter: Payer: Self-pay | Admitting: Nurse Practitioner

## 2014-11-05 DIAGNOSIS — R2240 Localized swelling, mass and lump, unspecified lower limb: Secondary | ICD-10-CM | POA: Insufficient documentation

## 2014-11-05 NOTE — Progress Notes (Signed)
SYMPTOM MANAGEMENT CLINIC   HPI: Laura Li 74 y.o. female diagnosed with breast cancer. Pt is s/p mastectomy, chemotherapy, tamoxifen, and letrozole. Pt is currently undergoing radiation treatments.   Pt reports developing a firm right lower leg mass within the past few days.  She is unsure if she bumped in to something; or if lesion is actually a bug bite. She denies any pain or itching to the site.   On exam- pt has an approx 1 cm in diameter firm lesion to her right medial lower leg just above her ankle.  There is no erythema, no surrounding edema, no warmth, no red streaks, and no tenderness to the site.    HPI  ROS  Past Medical History  Diagnosis Date  . OA (osteoarthritis)   . History of DVT (deep vein thrombosis)   . Hyperlipidemia   . Hypertension   . Hypothyroidism   . HX: breast cancer     melanoma  . Melanoma   . Eczema   . Arthritis   . Esophageal stricture   . GERD (gastroesophageal reflux disease)   . PONV (postoperative nausea and vomiting)   . Wears glasses   . Depression     states no depression  . Chronic kidney disease     some renal impairment from meds  . Headache   . Anemia     from medicaions  . Heart murmur     states it is benign told 30-40 years ago  . Pneumothorax on right 06/15/2014    Past Surgical History  Procedure Laterality Date  . Appendectomy  1967  . Melanoma excision  2004    right side of face  . 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 Total abdominal hysterectomy  1992    including ovaries  . Tonsillectomy and adenoidectomy  1949  . Lipoma excision  1978    right breast  . Mastectomy  1998    right-nodes out  . Breast lumpectomy      left breast-benign  . Breast surgery  1977    removal of calcified milk gland right breast  . Colonoscopy    . Radioactive seed guided mastectomy with axillary sentinel lymph node biopsy Left 05/05/2014    Procedure: RADIOACTIVE SEED GUIDED LEFT LUMPECTOMY WITH AXILLARY  SENTINEL LYMPH NODE BIOPSY;  Surgeon: Rolm Bookbinder, MD;  Location: [facility];  Service: General;  Laterality: Left;  . Chest tube insertion  06/15/2014  . Portacath placement Left 06/15/2014    Procedure: INSERTION PORT-A-CATH;  Surgeon: Rolm Bookbinder, MD;  Location: [facility] [facility];  Service: General;  Laterality: Left;  . Chest tube insertion Bilateral 06/15/2014    Procedure: CHEST TUBE INSERTION;  Surgeon: Rolm Bookbinder, MD;  Location: [facility];  Service: General;  Laterality: Bilateral;  . Port-a-cath removal N/A 10/05/2014    Procedure: REMOVAL PORT-A-CATH;  Surgeon: Rolm Bookbinder, MD;  Location: [place];  Service: General;  Laterality: N/A;    has Hypothyroidism; Dyslipidemia; Essential hypertension; Allergic rhinitis; Osteopenia; DVT, HX OF; Chronic kidney disease, stage III (moderate); Anemia in neoplastic disease; Vasculitis; GERD (gastroesophageal reflux disease); Hot flashes; Osteoarthritis; Melanoma of skin; Breast cancer, left breast; Breast cancer, right breast; Bilateral breast cancer; Family history of breast cancer; Breast cancer; Pneumothorax; Chest tube in place; Depression with anxiety; Diverticulitis; Dehydration; Antineoplastic chemotherapy induced anemia; Diarrhea; Excessive tearing; and Localized swelling, mass, or lump of lower extremity on her problem list.    is  allergic to dyazide; niacin; and penicillins.    Medication List       This list is accurate as of: 11/04/14 11:59 PM.  Always use your most recent med list.               acetaminophen 500 MG tablet  Commonly known as:  TYLENOL  Take 500 mg by mouth 2 (two) times daily as needed for mild pain.     atorvastatin 20 MG tablet  Commonly known as:  LIPITOR  Take 1 tablet (20 mg total) by mouth daily.     CALTRATE 600 PLUS-VIT D PO  Take 1 tablet by mouth 2 (two) times daily.     docusate sodium 100 MG capsule  Commonly known as:  COLACE  Take 1 capsule (100 mg total)  by mouth 2 (two) times daily.     esomeprazole 40 MG capsule  Commonly known as:  NEXIUM  TAKE 1 CAPSULE DAILY     fexofenadine 180 MG tablet  Commonly known as:  ALLEGRA  Take 180 mg by mouth daily as needed for allergies.     ipratropium 0.03 % nasal spray  Commonly known as:  ATROVENT  Place 2 sprays into the nose every 12 (twelve) hours.     levothyroxine 75 MCG tablet  Commonly known as:  SYNTHROID, LEVOTHROID  Take 1 tablet (75 mcg total) by mouth daily.     LOTEMAX 0.5 % ophthalmic suspension  Generic drug:  loteprednol  Place into both eyes 4 (four) times daily.     nadolol 20 MG tablet  Commonly known as:  CORGARD  Take 0.5 tablets (10 mg total) by mouth daily.     non-metallic deodorant Misc  Commonly known as:  ALRA  Apply 1 application topically daily as needed.     RADIAPLEX EX  Apply 170 g topically.     venlafaxine XR 150 MG 24 hr capsule  Commonly known as:  EFFEXOR-XR  Take 1 capsule (150 mg total) by mouth daily.         PHYSICAL EXAMINATION  Oncology Vitals 11/04/2014 11/01/2014 10/11/2014 10/05/2014 10/05/2014 10/05/2014 10/05/2014  Height - - 168 cm - - - 168 cm  Weight 65.726 kg 65.046 kg 64.32 kg - - - 63.05 kg  Weight (lbs) 144 lbs 14 oz 143 lbs 6 oz 141 lbs 13 oz - - - 139 lbs  BMI (kg/m2) - - 22.89 kg/m2 - - - 22.44 kg/m2  Temp 98.2 97.5 97.7 97.6 - 97.9 97.5  Pulse 90 72 90 65 72 76 69  Resp 18 - _0 SpO2 100 - - 99 99 100 100  BSA (m2) - - 1.73 m2 - - - 1.71 m2   BP Readings from Last 3 Encounters:  11/04/14 135/64  11/01/14 121/59  10/11/14 126/60    Physical Exam  Constitutional: She is oriented to person, place, and time and well-developed, well-nourished, and in no distress.  HENT:  Head: Normocephalic and atraumatic.  Eyes: Conjunctivae and EOM are normal. Pupils are equal, round, and reactive to light. Right eye exhibits no discharge. Left eye exhibits no discharge. No scleral icterus.  Neck: Normal range of motion.   Pulmonary/Chest: Effort normal. No respiratory distress.  Musculoskeletal: Normal range of motion. She exhibits edema. She exhibits no tenderness.  On exam- pt has an approx 1 cm in diameter firm lesion to her right medial lower leg just above her ankle.  There is no erythema, no  surrounding edema, no warmth, no red streaks, and no tenderness to the site.       Neurological: She is alert and oriented to person, place, and time. Gait normal.  Skin: Skin is warm and dry. No rash noted. No erythema. No pallor.  Psychiatric: Affect normal.  Nursing note and vitals reviewed.   LABORATORY DATA:. No visits with results within 3 Day(s) from this visit. Latest known visit with results is:  Appointment on 10/11/2014  Component Date Value Ref Range Status  . WBC 10/11/2014 6.3  3.9 - 10.3 10e3/uL Final  . NEUT# 10/11/2014 4.4  1.5 - 6.5 10e3/uL Final  . HGB 10/11/2014 10.7* 11.6 - 15.9 g/dL Final  . HCT 10/11/2014 31.6* 34.8 - 46.6 % Final  . Platelets 10/11/2014 293  145 - 400 10e3/uL Final  . MCV 10/11/2014 99.2  79.5 - 101.0 fL Final  . MCH 10/11/2014 33.7  25.1 - 34.0 pg Final  . MCHC 10/11/2014 34.0  31.5 - 36.0 g/dL Final  . RBC 10/11/2014 3.18* 3.70 - 5.45 10e6/uL Final  . RDW 10/11/2014 19.6* 11.2 - 14.5 % Final  . lymph# 10/11/2014 1.2  0.9 - 3.3 10e3/uL Final  . MONO# 10/11/2014 0.5  0.1 - 0.9 10e3/uL Final  . Eosinophils Absolute 10/11/2014 0.1  0.0 - 0.5 10e3/uL Final  . Basophils Absolute 10/11/2014 0.0  0.0 - 0.1 10e3/uL Final  . NEUT% 10/11/2014 69.8  38.4 - 76.8 % Final  . LYMPH% 10/11/2014 19.1  14.0 - 49.7 % Final  . MONO% 10/11/2014 8.4  0.0 - 14.0 % Final  . EOS% 10/11/2014 2.1  0.0 - 7.0 % Final  . BASO% 10/11/2014 0.6  0.0 - 2.0 % Final  . Sodium 10/11/2014 141  136 - 145 mEq/L Final  . Potassium 10/11/2014 4.1  3.5 - 5.1 mEq/L Final  . Chloride 10/11/2014 106  98 - 109 mEq/L Final  . CO2 10/11/2014 29  22 - 29 mEq/L Final  . Glucose 10/11/2014 92  70 - 140 mg/dl  Final  . BUN 10/11/2014 11.6  7.0 - 26.0 mg/dL Final  . Creatinine 10/11/2014 1.0  0.6 - 1.1 mg/dL Final  . Total Bilirubin 10/11/2014 0.51  0.20 - 1.20 mg/dL Final  . Alkaline Phosphatase 10/11/2014 85  40 - 150 U/L Final  . AST 10/11/2014 22  5 - 34 U/L Final  . ALT 10/11/2014 16  0 - 55 U/L Final  . Total Protein 10/11/2014 7.1  6.4 - 8.3 g/dL Final  . Albumin 10/11/2014 4.1  3.5 - 5.0 g/dL Final  . Calcium 10/11/2014 9.8  8.4 - 10.4 mg/dL Final  . Anion Gap 10/11/2014 7  3 - 11 mEq/L Final  . EGFR 10/11/2014 55* >90 ml/min/1.73 m2 Final   eGFR is calculated using the CKD-EPI Creatinine Equation (2009)     RADIOGRAPHIC STUDIES: No results found.  ASSESSMENT/PLAN:    Bilateral breast cancer (1) status post right modified radical mastectomy November 1998 for a pT2 pN0, stage IIA invasive ductal carcinoma, grade 2, estrogen and progesterone receptor positive, HER-2 not amplified (a) adjuvant chemotherapy consisted of doxorubicin and cyclophosphamide 4 (b) adjuvant anti-estrogens consisted of tamoxifen, then letrozole, for a total of 5 years  (2) status post left breast lower outer quadrant biopsy 04/07/2014 for a clinical T1c N0,stage IA invasive ductal carcinoma, with squamous features, estrogen and progesterone receptor negative, HER-2 not amplified, with an MIB-1 of 10%   (3) left lumpectomy and sentinel lymph node biopsy 05/05/2014 showed  aT1c pN0, stage IA invasive ductal carcinoma, grade 1, triple negative, with close but negative margins.   (4) Mammaprint classifies the tumor as basal like, high risk, and predicts a 30% chance of recurrence within 10 years with local treatment only.   (5) start of treatment delayed because of bilateral pneumothoraces after port placement. Adjuvant chemotherapy fiinally started 07/11/2014 consisting of carboplatin and docetaxel given every 21 days 4, completed 09/13/2014  (6) genetics testing March 2016 showed no  deleterious mutation in the [employer] panel [[employer]] including sequencing and rearrangement analysis for the following 24 genes:ATM, BARD1, BRCA1, BRCA2, BRIP1, CDH1, CHEK2, EPCAM, MLH1, MRE11A, MSH2, MSH6, MUTYH, NBN, NF1, PALB2, PMS2, PTEN, RAD50, RAD51C, RAD51D, SMARCA4, STK11, and TP53.  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   Pt is currently undergoing radiation treatments only.   Pt is scheduled to return for labs and visit on 01/05/15.    Localized swelling, mass, or lump of lower extremity Pt reports developing a firm right lower leg mass within the past few days.  She is unsure if she bumped in to something; or if lesion is actually a bug bite. She denies any pain or itching to the site.   On exam- pt has an approx 1 cm in diameter firm lesion to her right medial lower leg just above her ankle.  There is no erythema, no surrounding edema, no warmth, no red streaks, and no tenderness to the site.    Doppler US obtained was negative for any acute DVT or superficial clot. 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   Most likely, right lower leg mass is resolving small hematoma.   Pt was advised to elevate her leg when possible, apply warm moist heat occasionally, and to try ibuprofen for any inflammation.   Pt was also advised to cal/return or follow up with either her PCP or an ortho referral if needed.   Patient stated understanding of all instructions; and was in agreement with this plan of care. The patient knows to call the clinic with any problems, questions or concerns.   Review/collaboration with Dr. Magrinat  regarding all aspects of patient's visit today.   Total time spent with patient was 40 minutes;  with greater than 75 percent of that time spent in face to face counseling regarding patient's symptoms,  and coordination of care and follow  up.  Disclaimer:This dictation was prepared with Dragon/digital dictation along with Smartphrase technology. Any transcriptional errors that result from this process are unintentional.  , , NP 11/05/2014    

## 2014-11-05 NOTE — Assessment & Plan Note (Signed)
(  1) status post right modified radical mastectomy November 1998 for a pT2 pN0, stage IIA invasive ductal carcinoma, grade 2, estrogen and progesterone receptor positive, HER-2 not amplified (a) adjuvant chemotherapy consisted of doxorubicin and cyclophosphamide 4 (b) adjuvant anti-estrogens consisted of tamoxifen, then letrozole, for a total of 5 years  (2) status post left breast lower outer quadrant biopsy 04/07/2014 for a clinical T1c N0,stage IA invasive ductal carcinoma, with squamous features, estrogen and progesterone receptor negative, HER-2 not amplified, with an MIB-1 of 10%   (3) left lumpectomy and sentinel lymph node biopsy 05/05/2014 showed aT1c pN0, stage IA invasive ductal carcinoma, grade 1, triple negative, with close but negative margins.   (4) Mammaprint classifies the tumor as basal like, high risk, and predicts a 30% chance of recurrence within 10 years with local treatment only.   (5) start of treatment delayed because of bilateral pneumothoraces after port placement. Adjuvant chemotherapy fiinally started 07/11/2014 consisting of carboplatin and docetaxel given every 21 days 4, completed 09/13/2014  (6) genetics testing March 2016 showed no deleterious mutation in the [employer]Next panel [[employer]] including sequencing and rearrangement analysis for the following 24 genes:ATM, BARD1, BRCA1, BRCA2, BRIP1, CDH1, CHEK2, EPCAM, MLH1, MRE11A, MSH2, MSH6, MUTYH, NBN, NF1, PALB2, PMS2, PTEN, RAD50, RAD51C, RAD51D, SMARCA4, STK11, and TP53.  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   Pt is currently undergoing radiation treatments only.   Pt is scheduled to return for labs and visit on 01/05/15.

## 2014-11-05 NOTE — Assessment & Plan Note (Signed)
Pt reports developing a firm right lower leg mass within the past few days.  She is unsure if she bumped in to something; or if lesion is actually a bug bite. She denies any pain or itching to the site.   On exam- pt has an approx 1 cm in diameter firm lesion to her right medial lower leg just above her ankle.  There is no erythema, no surrounding edema, no warmth, no red streaks, and no tenderness to the site.    Doppler US obtained was negative for any acute DVT or superficial clot. 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   Most likely, right lower leg mass is resolving small hematoma.   Pt was advised to elevate her leg when possible, apply warm moist heat occasionally, and to try ibuprofen for any inflammation.   Pt was also advised to cal/return or follow up with either her PCP or an ortho referral if needed.

## 2014-11-07 ENCOUNTER — Ambulatory Visit
Admission: RE | Admit: 2014-11-07 | Discharge: 2014-11-07 | Disposition: A | Discharge status: Home / Self Care | Payer: Medicare Other | Source: Ambulatory Visit | Attending: Radiation Oncology | Admitting: Radiation Oncology

## 2014-11-07 DIAGNOSIS — C50912 Malignant neoplasm of unspecified site of left female breast: Secondary | ICD-10-CM | POA: Diagnosis not present

## 2014-11-08 ENCOUNTER — Ambulatory Visit
Admission: RE | Admit: 2014-11-08 | Discharge: 2014-11-08 | Disposition: A | Discharge status: Home / Self Care | Payer: Medicare Other | Source: Ambulatory Visit | Attending: Radiation Oncology | Admitting: Radiation Oncology

## 2014-11-08 ENCOUNTER — Telehealth: Payer: Self-pay | Admitting: *Deleted

## 2014-11-08 VITALS — BP 106/86 | HR 70 | Temp 97.8°F | Wt 144.3 lb

## 2014-11-08 DIAGNOSIS — C50912 Malignant neoplasm of unspecified site of left female breast: Secondary | ICD-10-CM | POA: Diagnosis not present

## 2014-11-08 DIAGNOSIS — C50512 Malignant neoplasm of lower-outer quadrant of left female breast: Secondary | ICD-10-CM

## 2014-11-08 NOTE — Telephone Encounter (Signed)
Spoke to pt about R leg pain. She reports improvement. She still has some pain that resolves with ibuprofen. She keeps leg elevated and sometimes uses ice to help. It has improved about 50% Pt denies any concerns of questions at this time.

## 2014-11-08 NOTE — Progress Notes (Signed)
Weekly assessment of radiation to left breast.Completed 7 of 25 treatments.No skin changes.Denies pain.

## 2014-11-08 NOTE — Progress Notes (Signed)
Weekly Management Note Current Dose:  12.6 Gy  Projected Dose: 61 Gy   Narrative:  The patient presents for routine under treatment assessment.  CBCT/MVCT images/Port film x-rays were reviewed.  The chart was checked. No complaints except occasional breast pain at night.   Physical Findings: Weight: 144 lb 4.8 oz (65.454 kg). Unchanged  Impression:  The patient is tolerating radiation.  Plan:  Continue treatment as planned.  Continue radiaplex.

## 2014-11-09 ENCOUNTER — Ambulatory Visit
Admission: RE | Admit: 2014-11-09 | Discharge: 2014-11-09 | Disposition: A | Discharge status: Home / Self Care | Payer: Medicare Other | Source: Ambulatory Visit | Attending: Radiation Oncology | Admitting: Radiation Oncology

## 2014-11-09 DIAGNOSIS — C50912 Malignant neoplasm of unspecified site of left female breast: Secondary | ICD-10-CM | POA: Diagnosis not present

## 2014-11-10 ENCOUNTER — Ambulatory Visit
Admission: RE | Admit: 2014-11-10 | Discharge: 2014-11-10 | Disposition: A | Discharge status: Home / Self Care | Payer: Medicare Other | Source: Ambulatory Visit | Attending: Radiation Oncology | Admitting: Radiation Oncology

## 2014-11-10 DIAGNOSIS — C50912 Malignant neoplasm of unspecified site of left female breast: Secondary | ICD-10-CM | POA: Insufficient documentation

## 2014-11-10 DIAGNOSIS — Z510 Encounter for antineoplastic radiation therapy: Secondary | ICD-10-CM | POA: Diagnosis not present

## 2014-11-11 ENCOUNTER — Ambulatory Visit
Admission: RE | Admit: 2014-11-11 | Discharge: 2014-11-11 | Disposition: A | Discharge status: Home / Self Care | Payer: Medicare Other | Source: Ambulatory Visit | Attending: Radiation Oncology | Admitting: Radiation Oncology

## 2014-11-11 DIAGNOSIS — Z510 Encounter for antineoplastic radiation therapy: Secondary | ICD-10-CM | POA: Insufficient documentation

## 2014-11-11 DIAGNOSIS — C50912 Malignant neoplasm of unspecified site of left female breast: Secondary | ICD-10-CM | POA: Diagnosis not present

## 2014-11-13 DIAGNOSIS — Z510 Encounter for antineoplastic radiation therapy: Secondary | ICD-10-CM | POA: Diagnosis not present

## 2014-11-13 DIAGNOSIS — C50912 Malignant neoplasm of unspecified site of left female breast: Secondary | ICD-10-CM | POA: Diagnosis not present

## 2014-11-15 ENCOUNTER — Ambulatory Visit
Admission: RE | Admit: 2014-11-15 | Discharge: 2014-11-15 | Disposition: A | Discharge status: Home / Self Care | Payer: Medicare Other | Source: Ambulatory Visit | Attending: Radiation Oncology | Admitting: Radiation Oncology

## 2014-11-15 ENCOUNTER — Encounter: Payer: Self-pay | Admitting: Radiation Oncology

## 2014-11-15 VITALS — BP 121/41 | HR 73 | Temp 97.4°F | Resp 20 | Wt 142.9 lb

## 2014-11-15 DIAGNOSIS — C50512 Malignant neoplasm of lower-outer quadrant of left female breast: Secondary | ICD-10-CM

## 2014-11-15 DIAGNOSIS — C50912 Malignant neoplasm of unspecified site of left female breast: Secondary | ICD-10-CM | POA: Diagnosis not present

## 2014-11-15 DIAGNOSIS — Z510 Encounter for antineoplastic radiation therapy: Secondary | ICD-10-CM | POA: Diagnosis not present

## 2014-11-15 NOTE — Progress Notes (Signed)
Weekly Management Note Current Dose:  19.8 Gy  Projected Dose: 61 Gy   Narrative:  The patient presents for routine under treatment assessment.  CBCT/MVCT images/Port film x-rays were reviewed.  The chart was checked. No complaints except occasional breast pain at night.   Physical Findings: Weight: 142 lb 14.4 oz (64.819 kg). Slightly pink left breast.   Impression:  The patient is tolerating radiation.  Plan:  Continue treatment as planned.  Continue radiaplex.

## 2014-11-15 NOTE — Progress Notes (Signed)
Weekly rad txs left breast 11/25 completed  No skin changes, uses radiaplex bid, no c/opain, appetite good, energy level fine birthday Saturday  2:27 PM BP 121/41 mmHg  Pulse 73  Temp(Src) 97.4 F (36.3 C) (Oral)  Resp 20  Wt 142 lb 14.4 oz (64.819 kg)  Wt Readings from Last 3 Encounters:  11/15/14 142 lb 14.4 oz (64.819 kg)  11/08/14 144 lb 4.8 oz (65.454 kg)  11/04/14 144 lb 14.4 oz (65.726 kg)

## 2014-11-16 ENCOUNTER — Ambulatory Visit
Admission: RE | Admit: 2014-11-16 | Discharge: 2014-11-16 | Disposition: A | Discharge status: Home / Self Care | Payer: Medicare Other | Source: Ambulatory Visit | Attending: Radiation Oncology | Admitting: Radiation Oncology

## 2014-11-16 DIAGNOSIS — Z510 Encounter for antineoplastic radiation therapy: Secondary | ICD-10-CM | POA: Diagnosis not present

## 2014-11-16 DIAGNOSIS — C50912 Malignant neoplasm of unspecified site of left female breast: Secondary | ICD-10-CM | POA: Diagnosis not present

## 2014-11-17 ENCOUNTER — Ambulatory Visit
Admission: RE | Admit: 2014-11-17 | Discharge: 2014-11-17 | Disposition: A | Discharge status: Home / Self Care | Payer: Medicare Other | Source: Ambulatory Visit | Attending: Radiation Oncology | Admitting: Radiation Oncology

## 2014-11-17 DIAGNOSIS — C50912 Malignant neoplasm of unspecified site of left female breast: Secondary | ICD-10-CM | POA: Diagnosis not present

## 2014-11-17 DIAGNOSIS — Z510 Encounter for antineoplastic radiation therapy: Secondary | ICD-10-CM | POA: Diagnosis not present

## 2014-11-18 ENCOUNTER — Ambulatory Visit
Admission: RE | Admit: 2014-11-18 | Discharge: 2014-11-18 | Disposition: A | Discharge status: Home / Self Care | Payer: Medicare Other | Source: Ambulatory Visit | Attending: Radiation Oncology | Admitting: Radiation Oncology

## 2014-11-18 DIAGNOSIS — Z510 Encounter for antineoplastic radiation therapy: Secondary | ICD-10-CM | POA: Diagnosis not present

## 2014-11-18 DIAGNOSIS — C50912 Malignant neoplasm of unspecified site of left female breast: Secondary | ICD-10-CM | POA: Diagnosis not present

## 2014-11-21 ENCOUNTER — Ambulatory Visit
Admission: RE | Admit: 2014-11-21 | Discharge: 2014-11-21 | Disposition: A | Discharge status: Home / Self Care | Payer: Medicare Other | Source: Ambulatory Visit | Attending: Radiation Oncology | Admitting: Radiation Oncology

## 2014-11-21 DIAGNOSIS — Z510 Encounter for antineoplastic radiation therapy: Secondary | ICD-10-CM | POA: Insufficient documentation

## 2014-11-21 DIAGNOSIS — C50911 Malignant neoplasm of unspecified site of right female breast: Secondary | ICD-10-CM | POA: Diagnosis not present

## 2014-11-21 DIAGNOSIS — C50912 Malignant neoplasm of unspecified site of left female breast: Secondary | ICD-10-CM | POA: Diagnosis not present

## 2014-11-21 DIAGNOSIS — C439 Malignant melanoma of skin, unspecified: Secondary | ICD-10-CM | POA: Diagnosis not present

## 2014-11-22 ENCOUNTER — Ambulatory Visit
Admission: RE | Admit: 2014-11-22 | Discharge: 2014-11-22 | Disposition: A | Discharge status: Home / Self Care | Payer: Medicare Other | Source: Ambulatory Visit | Attending: Radiation Oncology | Admitting: Radiation Oncology

## 2014-11-22 ENCOUNTER — Encounter: Payer: Self-pay | Admitting: Family Medicine

## 2014-11-22 ENCOUNTER — Ambulatory Visit (INDEPENDENT_AMBULATORY_CARE_PROVIDER_SITE_OTHER): Payer: Medicare Other | Admitting: Family Medicine

## 2014-11-22 VITALS — BP 118/70 | HR 90 | Temp 97.9°F | Wt 143.0 lb

## 2014-11-22 DIAGNOSIS — E785 Hyperlipidemia, unspecified: Secondary | ICD-10-CM

## 2014-11-22 DIAGNOSIS — Z510 Encounter for antineoplastic radiation therapy: Secondary | ICD-10-CM | POA: Diagnosis not present

## 2014-11-22 DIAGNOSIS — E038 Other specified hypothyroidism: Secondary | ICD-10-CM

## 2014-11-22 DIAGNOSIS — I10 Essential (primary) hypertension: Secondary | ICD-10-CM | POA: Diagnosis not present

## 2014-11-22 DIAGNOSIS — N183 Chronic kidney disease, stage 3 unspecified: Secondary | ICD-10-CM

## 2014-11-22 DIAGNOSIS — K219 Gastro-esophageal reflux disease without esophagitis: Secondary | ICD-10-CM

## 2014-11-22 DIAGNOSIS — Z23 Encounter for immunization: Secondary | ICD-10-CM

## 2014-11-22 DIAGNOSIS — E034 Atrophy of thyroid (acquired): Secondary | ICD-10-CM

## 2014-11-22 DIAGNOSIS — C50912 Malignant neoplasm of unspecified site of left female breast: Secondary | ICD-10-CM | POA: Diagnosis not present

## 2014-11-22 NOTE — Assessment & Plan Note (Signed)
S: controlled. On Nadolol 10mg  A/P:Continue current meds

## 2014-11-22 NOTE — Progress Notes (Signed)
[name], MD  Subjective:  Laura Li is a 74 y.o. year old very pleasant female patient who presents for/with See problem oriented charting We also discussed her breast cancer history. Following with Dr. Jana Hakim. R radical mastectomy 1998. New breast cancer L 04/07/14- left lumpectomy and sentinal node biopsy 05/05/14- close but negative margins. 30% chance recurrence within 10 years with local only so plan for chemo and radiation. Pneumothorax bilateral requiring chest tubes after port placement. Chemo started 07/11/14. Now undergoing radiation. Patient has had issues with tearing- she is following up with Optho but may wait for any procedures until after radiation completed. Losing hair, feeling fatigued. Low exercise level- willing to consider.   ROS- no chest pain, shortness of breath, cancer related fatigue, hair loss due to chemo. No heat/cold intolerance. No constipation or diarrhea. Denies shakiness or anxiety.  No palpitations.   Past Medical History-  Patient Active Problem List   Diagnosis Date Noted  . Pneumothorax 06/15/2014    Priority: High  . Breast cancer, left breast 04/24/2014    Priority: High  . 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 Melanoma of skin 03/31/2014    Priority: Medium  . Chronic kidney disease, stage III (moderate) 12/09/2012    Priority: Medium  . 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    Priority: Medium  . DVT, HX OF 12/30/2006    Priority: Medium  . Hypothyroidism 02/06/2006    Priority: Medium  . Hyperlipidemia 02/06/2006    Priority: Medium  . Essential hypertension 02/06/2006    Priority: Medium  . GERD (gastroesophageal reflux disease) 03/31/2014    Priority: Low  . Hot flashes 03/31/2014    Priority: Low  . Osteoarthritis 03/31/2014    Priority: Low  . Allergic rhinitis 02/06/2006    Priority: Low  . Osteopenia 02/06/2006    Priority: Low  . Localized swelling, mass, or lump of lower extremity 11/05/2014  . Excessive  tearing 09/21/2014  . Antineoplastic chemotherapy induced anemia 08/17/2014  . Diverticulitis 08/09/2014  . Bilateral breast cancer 04/29/2014  . Family history of breast cancer 04/29/2014  . Breast cancer, right breast 04/24/2014    Medications- reviewed and updated Current Outpatient Prescriptions  Medication Sig Dispense Refill  . atorvastatin (LIPITOR) 20 MG tablet Take 1 tablet (20 mg total) by mouth daily. 90 tablet 3  . Calcium-Vitamin D (CALTRATE 600 PLUS-VIT D PO) Take 1 tablet by mouth 2 (two) times daily.     [name] docusate sodium (COLACE) 100 MG capsule Take 1 capsule (100 mg total) by mouth 2 (two) times daily. 40 capsule 0  . esomeprazole (NEXIUM) 40 MG capsule TAKE 1 CAPSULE DAILY (Patient taking differently: Take 40 mg by mouth daily. TAKE 1 CAPSULE DAILY) 90 capsule 3  . fexofenadine (ALLEGRA) 180 MG tablet Take 180 mg by mouth daily as needed for allergies.     . hyaluronate sodium (RADIAPLEXRX) GEL Apply 1 application topically 2 (two) times daily.    [name] ipratropium (ATROVENT) 0.03 % nasal spray Place 2 sprays into the nose every 12 (twelve) hours. 180 mL 4  . levothyroxine (SYNTHROID, LEVOTHROID) 75 MCG tablet Take 1 tablet (75 mcg total) by mouth daily. 90 tablet 2  . loteprednol (LOTEMAX) 0.5 % ophthalmic suspension Place into both eyes 4 (four) times daily. 5 mL 0  . nadolol (CORGARD) 20 MG tablet Take 0.5 tablets (10 mg total) by mouth daily. 90 tablet 1  . non-metallic deodorant (ALRA) MISC Apply 1 application topically daily  as needed.    . venlafaxine XR (EFFEXOR-XR) 150 MG 24 hr capsule Take 1 capsule (150 mg total) by mouth daily. 180 capsule 0  . Wound Cleansers (RADIAPLEX EX) Apply 170 g topically.    [name] acetaminophen (TYLENOL) 500 MG tablet Take 500 mg by mouth 2 (two) times daily as needed for mild pain.      No current facility-administered medications for this visit.    Objective: BP 118/70 mmHg  Pulse 90  Temp(Src) 97.9 F (36.6 C)  Wt 143 lb (64.864  kg) Gen: NAD, resting comfortably, wearing hat- hair loss noted CV: RRR no murmurs rubs or gallops Lungs: CTAB no crackles, wheeze, rhonchi Abdomen: soft/nontender/nondistended/normal bowel sounds. No rebound or guarding.  Ext: no edema, 1 cm or less firm lesion on right lower leg medial side above ankle. Mld tenderness but no erythema, warmth (mild improvement per patient since oncology visit where she had duplex which was negative for DVT) Skin: warm, dry Neuro: grossly normal, moves all extremities  Assessment/Plan:  GERD (gastroesophageal reflux disease) S: controlled. On nexium 40mg  A/P:Continue current meds. Some potential risk regarding CKD but given age and stability of CKD will continue current dose   Chronic kidney disease, stage III (moderate) S: GFR stable at 55 last check in august through oncology labs. Avoiding nsaids. BP controlled A/P: continue BP control, continue PPI unless GFR regularly below 45   Essential hypertension S: controlled. On Nadolol 10mg  A/P:Continue current meds   Hypothyroidism S: symptomatically controlled. Last check about a year ago.  A/P: we will check TSH next time she has oncology labs-gave printed rx to patient.    Hyperlipidemia S: mild poor control previosuly with LDL 124 last year.  A/P: we discussed unclear benefit for primary prevention. We will continue atorvastatin but not increase dose to strive for LDL <100.    6 months, prevnar 13 at that time  Immunization History  Administered Date(s) Administered  . Influenza Split 02/11/2011  . Influenza Whole 12/21/2008, 01/16/2010  . Influenza,inj,Quad PF,36+ Mos 12/21/2013, 11/22/2014  . Influenza-Unspecified 12/09/2012  . Pneumococcal Polysaccharide-23 03/11/2001  . Td 03/19/2010     Orders Placed This Encounter  Procedures  . Flu Vaccine QUAD 36+ mos IM

## 2014-11-22 NOTE — Assessment & Plan Note (Signed)
S: GFR stable at 55 last check in august through oncology labs. Avoiding nsaids. BP controlled A/P: continue BP control, continue PPI unless GFR regularly below 45

## 2014-11-22 NOTE — Assessment & Plan Note (Signed)
S: mild poor control previosuly with LDL 124 last year.  A/P: we discussed unclear benefit for primary prevention. We will continue atorvastatin but not increase dose to strive for LDL <100.

## 2014-11-22 NOTE — Assessment & Plan Note (Signed)
S: controlled. On nexium 40mg  A/P:Continue current meds. Some potential risk regarding CKD but given age and stability of CKD will continue current dose

## 2014-11-22 NOTE — Assessment & Plan Note (Signed)
S: symptomatically controlled. Last check about a year ago.  A/P: we will check TSH next time she has oncology labs-gave printed rx to patient.

## 2014-11-22 NOTE — Patient Instructions (Signed)
Medication Instructions:  No changes  Other Instructions:  Would really love to have you walking at least 5 minutes a day to help with cancer related fatigue  Blood pressure looks great  Would not likely change cholesterol medicine even if high so we will hold off on checking  Labwork: Reasonably up to date Thyroid testing with next blood draw  Testing/Procedures/Immunizations: Health Maintenance Due  Topic Date Due  . PNA vac Low Risk Adult (1 of 2 - PCV13)- next visit 11/12/2003  . INFLUENZA VACCINE - today 10/10/2014   Follow-Up (all visit scheduling, rescheduling, cancellations including labs should be scheduled at front desk): 6 months.   Sooner if you need [place] or if you have new or worsening symptoms

## 2014-11-23 ENCOUNTER — Ambulatory Visit
Admission: RE | Admit: 2014-11-23 | Discharge: 2014-11-23 | Disposition: A | Discharge status: Home / Self Care | Payer: Medicare Other | Source: Ambulatory Visit | Attending: Radiation Oncology | Admitting: Radiation Oncology

## 2014-11-23 DIAGNOSIS — C50912 Malignant neoplasm of unspecified site of left female breast: Secondary | ICD-10-CM | POA: Diagnosis not present

## 2014-11-23 DIAGNOSIS — Z510 Encounter for antineoplastic radiation therapy: Secondary | ICD-10-CM | POA: Diagnosis not present

## 2014-11-24 ENCOUNTER — Ambulatory Visit
Admission: RE | Admit: 2014-11-24 | Discharge: 2014-11-24 | Disposition: A | Discharge status: Home / Self Care | Payer: Medicare Other | Source: Ambulatory Visit | Attending: Radiation Oncology | Admitting: Radiation Oncology

## 2014-11-24 ENCOUNTER — Encounter: Payer: Self-pay | Admitting: Radiation Oncology

## 2014-11-24 VITALS — BP 118/61 | HR 72 | Temp 97.9°F | Resp 20 | Wt 144.4 lb

## 2014-11-24 DIAGNOSIS — C50512 Malignant neoplasm of lower-outer quadrant of left female breast: Secondary | ICD-10-CM

## 2014-11-24 DIAGNOSIS — Z510 Encounter for antineoplastic radiation therapy: Secondary | ICD-10-CM | POA: Diagnosis not present

## 2014-11-24 DIAGNOSIS — C50912 Malignant neoplasm of unspecified site of left female breast: Secondary | ICD-10-CM | POA: Diagnosis not present

## 2014-11-24 NOTE — Progress Notes (Signed)
Weekly rad txs left breast,mild erythema with some bumps/dermatits looking recently,mild itching, using radiaplex bid, good appetite,fair energy level stated 2:34 PM BP 118/61 mmHg  Pulse 72  Temp(Src) 97.9 F (36.6 C) (Oral)  Resp 20  Wt 144 lb 6.4 oz (65.499 kg)  Wt Readings from Last 3 Encounters:  11/24/14 144 lb 6.4 oz (65.499 kg)  11/22/14 143 lb (64.864 kg)  11/15/14 142 lb 14.4 oz (64.819 kg)

## 2014-11-24 NOTE — Progress Notes (Signed)
Weekly Management Note Current Dose:  32.4 Gy  Projected Dose: 61 Gy   Narrative:  The patient presents for routine under treatment assessment.  CBCT/MVCT images/Port film x-rays were reviewed.  The chart was checked. No complaints except fatigue and some skin bumps on medial breast.   Physical Findings: Weight: 144 lb 6.4 oz (65.499 kg). Slightly pink left breast.   Impression:  The patient is tolerating radiation.  Plan:  Continue treatment as planned.  Continue radiaplex.Add hydrocortisone prn for medial breast.

## 2014-11-25 ENCOUNTER — Ambulatory Visit
Admission: RE | Admit: 2014-11-25 | Discharge: 2014-11-25 | Disposition: A | Discharge status: Home / Self Care | Payer: Medicare Other | Source: Ambulatory Visit | Attending: Radiation Oncology | Admitting: Radiation Oncology

## 2014-11-25 DIAGNOSIS — Z510 Encounter for antineoplastic radiation therapy: Secondary | ICD-10-CM | POA: Diagnosis not present

## 2014-11-25 DIAGNOSIS — C50912 Malignant neoplasm of unspecified site of left female breast: Secondary | ICD-10-CM | POA: Diagnosis not present

## 2014-11-28 ENCOUNTER — Ambulatory Visit
Admission: RE | Admit: 2014-11-28 | Discharge: 2014-11-28 | Disposition: A | Discharge status: Home / Self Care | Payer: Medicare Other | Source: Ambulatory Visit | Attending: Radiation Oncology | Admitting: Radiation Oncology

## 2014-11-28 DIAGNOSIS — C50912 Malignant neoplasm of unspecified site of left female breast: Secondary | ICD-10-CM | POA: Diagnosis not present

## 2014-11-28 DIAGNOSIS — Z510 Encounter for antineoplastic radiation therapy: Secondary | ICD-10-CM | POA: Diagnosis not present

## 2014-11-29 ENCOUNTER — Ambulatory Visit
Admission: RE | Admit: 2014-11-29 | Discharge: 2014-11-29 | Disposition: A | Discharge status: Home / Self Care | Payer: Medicare Other | Source: Ambulatory Visit | Attending: Radiation Oncology | Admitting: Radiation Oncology

## 2014-11-29 ENCOUNTER — Ambulatory Visit: Payer: Medicare Other | Admitting: Radiation Oncology

## 2014-11-29 ENCOUNTER — Encounter: Payer: Self-pay | Admitting: Radiation Oncology

## 2014-11-29 VITALS — BP 113/53 | HR 64 | Temp 97.5°F | Resp 16 | Wt 143.9 lb

## 2014-11-29 DIAGNOSIS — C50912 Malignant neoplasm of unspecified site of left female breast: Secondary | ICD-10-CM | POA: Diagnosis not present

## 2014-11-29 DIAGNOSIS — C50512 Malignant neoplasm of lower-outer quadrant of left female breast: Secondary | ICD-10-CM

## 2014-11-29 DIAGNOSIS — Z510 Encounter for antineoplastic radiation therapy: Secondary | ICD-10-CM | POA: Diagnosis not present

## 2014-11-29 NOTE — Progress Notes (Signed)
Weekly Management Note Current Dose:  37.8 Gy  Projected Dose: 61 Gy   Narrative:  The patient presents for routine under treatment assessment.  CBCT/MVCT images/Port film x-rays were reviewed.  The chart was checked. No complaints except occasional breast pain at night.   Physical Findings: Weight: 143 lb 14.4 oz (65.273 kg). Slightly pink left breast. Dermatitis medially.   Impression:  The patient is tolerating radiation.  Plan:  Continue treatment as planned.  Continue radiaplex.

## 2014-11-29 NOTE — Progress Notes (Signed)
Weekly rad tx 21 Left breast completed,  slight erythema,dermatitis bumps,  on chest ,using rdiaplex bid, skin intact, occasional sharp pain in breast, mild itching at times, appetite good, energy good 2:32 PM BP 113/53 mmHg  Pulse 64  Temp(Src) 97.5 F (36.4 C) (Oral)  Resp 16  Wt 143 lb 14.4 oz (65.273 kg)  Wt Readings from Last 3 Encounters:  11/29/14 143 lb 14.4 oz (65.273 kg)  11/24/14 144 lb 6.4 oz (65.499 kg)  11/22/14 143 lb (64.864 kg)

## 2014-11-30 ENCOUNTER — Ambulatory Visit
Admission: RE | Admit: 2014-11-30 | Discharge: 2014-11-30 | Disposition: A | Discharge status: Home / Self Care | Payer: Medicare Other | Source: Ambulatory Visit | Attending: Radiation Oncology | Admitting: Radiation Oncology

## 2014-11-30 DIAGNOSIS — C50912 Malignant neoplasm of unspecified site of left female breast: Secondary | ICD-10-CM | POA: Diagnosis not present

## 2014-11-30 DIAGNOSIS — Z510 Encounter for antineoplastic radiation therapy: Secondary | ICD-10-CM | POA: Diagnosis not present

## 2014-12-01 ENCOUNTER — Ambulatory Visit: Payer: Medicare Other | Admitting: Radiation Oncology

## 2014-12-01 ENCOUNTER — Ambulatory Visit
Admission: RE | Admit: 2014-12-01 | Discharge: 2014-12-01 | Disposition: A | Discharge status: Home / Self Care | Payer: Medicare Other | Source: Ambulatory Visit | Attending: Radiation Oncology | Admitting: Radiation Oncology

## 2014-12-01 DIAGNOSIS — Z510 Encounter for antineoplastic radiation therapy: Secondary | ICD-10-CM | POA: Diagnosis not present

## 2014-12-01 DIAGNOSIS — C50912 Malignant neoplasm of unspecified site of left female breast: Secondary | ICD-10-CM | POA: Diagnosis not present

## 2014-12-02 ENCOUNTER — Ambulatory Visit
Admission: RE | Admit: 2014-12-02 | Discharge: 2014-12-02 | Disposition: A | Discharge status: Home / Self Care | Payer: Medicare Other | Source: Ambulatory Visit | Attending: Radiation Oncology | Admitting: Radiation Oncology

## 2014-12-02 DIAGNOSIS — C50912 Malignant neoplasm of unspecified site of left female breast: Secondary | ICD-10-CM | POA: Diagnosis not present

## 2014-12-02 DIAGNOSIS — Z510 Encounter for antineoplastic radiation therapy: Secondary | ICD-10-CM | POA: Diagnosis not present

## 2014-12-05 ENCOUNTER — Ambulatory Visit
Admission: RE | Admit: 2014-12-05 | Discharge: 2014-12-05 | Disposition: A | Discharge status: Home / Self Care | Payer: Medicare Other | Source: Ambulatory Visit | Attending: Radiation Oncology | Admitting: Radiation Oncology

## 2014-12-05 DIAGNOSIS — C50912 Malignant neoplasm of unspecified site of left female breast: Secondary | ICD-10-CM | POA: Diagnosis not present

## 2014-12-06 ENCOUNTER — Ambulatory Visit
Admission: RE | Admit: 2014-12-06 | Discharge: 2014-12-06 | Disposition: A | Discharge status: Home / Self Care | Payer: Medicare Other | Source: Ambulatory Visit | Attending: Radiation Oncology | Admitting: Radiation Oncology

## 2014-12-06 ENCOUNTER — Ambulatory Visit: Payer: Medicare Other | Admitting: Radiation Oncology

## 2014-12-06 VITALS — BP 111/48 | HR 60 | Temp 97.5°F | Wt 143.0 lb

## 2014-12-06 DIAGNOSIS — Z510 Encounter for antineoplastic radiation therapy: Secondary | ICD-10-CM | POA: Diagnosis not present

## 2014-12-06 DIAGNOSIS — C50912 Malignant neoplasm of unspecified site of left female breast: Secondary | ICD-10-CM | POA: Diagnosis not present

## 2014-12-06 DIAGNOSIS — C50512 Malignant neoplasm of lower-outer quadrant of left female breast: Secondary | ICD-10-CM

## 2014-12-06 NOTE — Progress Notes (Signed)
Weekly Management Note Current Dose:  47 Gy  Projected Dose: 61 Gy   Narrative:  The patient presents for routine under treatment assessment.  CBCT/MVCT images/Port film x-rays were reviewed.  The chart was checked. No complaints except occasional breast pain at night.   Physical Findings: Weight: 143 lb (64.864 kg). Slightly pink left breast. Dermatitis medially.   Impression:  The patient is tolerating radiation.  Plan:  Continue treatment as planned.  Continue radiaplex. Refill of radiaple given. Wait for 3 months to schedule lasik surgery.

## 2014-12-06 NOTE — Progress Notes (Signed)
Weekly assessment of radiation to left [employer] 26 of 33 treatments.Mild redness.follicular itching rash is mild.given another tube of radiaplex. BP 111/48 mmHg  Pulse 60  Temp(Src) 97.5 F (36.4 C)  Wt 143 lb (64.864 kg)

## 2014-12-07 ENCOUNTER — Ambulatory Visit
Admission: RE | Admit: 2014-12-07 | Discharge: 2014-12-07 | Disposition: A | Discharge status: Home / Self Care | Payer: Medicare Other | Source: Ambulatory Visit | Attending: Radiation Oncology | Admitting: Radiation Oncology

## 2014-12-07 ENCOUNTER — Telehealth: Payer: Self-pay | Admitting: Family Medicine

## 2014-12-07 DIAGNOSIS — F418 Other specified anxiety disorders: Secondary | ICD-10-CM

## 2014-12-07 DIAGNOSIS — Z510 Encounter for antineoplastic radiation therapy: Secondary | ICD-10-CM | POA: Diagnosis not present

## 2014-12-07 DIAGNOSIS — C50912 Malignant neoplasm of unspecified site of left female breast: Secondary | ICD-10-CM | POA: Diagnosis not present

## 2014-12-07 MED ORDER — VENLAFAXINE HCL ER 150 MG PO CP24: 150.0000 mg | ORAL_CAPSULE | Freq: Every day | ORAL | Status: DC

## 2014-12-07 NOTE — Telephone Encounter (Signed)
Medication sent in. 

## 2014-12-07 NOTE — Telephone Encounter (Signed)
[facility] - [place], [place] - [address] [phone]  Requesting refill of venlafaxine XR (EFFEXOR-XR) 150 MG 24 hr capsule

## 2014-12-08 ENCOUNTER — Ambulatory Visit
Admission: RE | Admit: 2014-12-08 | Discharge: 2014-12-08 | Disposition: A | Discharge status: Home / Self Care | Payer: Medicare Other | Source: Ambulatory Visit | Attending: Radiation Oncology | Admitting: Radiation Oncology

## 2014-12-08 DIAGNOSIS — C50912 Malignant neoplasm of unspecified site of left female breast: Secondary | ICD-10-CM | POA: Diagnosis not present

## 2014-12-09 ENCOUNTER — Ambulatory Visit
Admission: RE | Admit: 2014-12-09 | Discharge: 2014-12-09 | Disposition: A | Discharge status: Home / Self Care | Payer: Medicare Other | Source: Ambulatory Visit | Attending: Radiation Oncology | Admitting: Radiation Oncology

## 2014-12-09 DIAGNOSIS — C50912 Malignant neoplasm of unspecified site of left female breast: Secondary | ICD-10-CM | POA: Diagnosis not present

## 2014-12-12 ENCOUNTER — Ambulatory Visit
Admission: RE | Admit: 2014-12-12 | Discharge: 2014-12-12 | Disposition: A | Discharge status: Home / Self Care | Payer: Medicare Other | Source: Ambulatory Visit | Attending: Radiation Oncology | Admitting: Radiation Oncology

## 2014-12-12 DIAGNOSIS — C50912 Malignant neoplasm of unspecified site of left female breast: Secondary | ICD-10-CM | POA: Diagnosis not present

## 2014-12-13 ENCOUNTER — Telehealth: Payer: Self-pay | Admitting: Oncology

## 2014-12-13 ENCOUNTER — Encounter: Payer: Self-pay | Admitting: Radiation Oncology

## 2014-12-13 ENCOUNTER — Ambulatory Visit
Admission: RE | Admit: 2014-12-13 | Discharge: 2014-12-13 | Disposition: A | Discharge status: Home / Self Care | Payer: Medicare Other | Source: Ambulatory Visit | Attending: Radiation Oncology | Admitting: Radiation Oncology

## 2014-12-13 ENCOUNTER — Other Ambulatory Visit: Payer: Self-pay | Admitting: *Deleted

## 2014-12-13 VITALS — BP 117/59 | HR 72 | Temp 97.4°F | Ht 66.0 in | Wt 143.1 lb

## 2014-12-13 DIAGNOSIS — C50912 Malignant neoplasm of unspecified site of left female breast: Secondary | ICD-10-CM

## 2014-12-13 DIAGNOSIS — C50512 Malignant neoplasm of lower-outer quadrant of left female breast: Secondary | ICD-10-CM

## 2014-12-13 DIAGNOSIS — Z510 Encounter for antineoplastic radiation therapy: Secondary | ICD-10-CM | POA: Diagnosis not present

## 2014-12-13 NOTE — Progress Notes (Signed)
Met with pt prior to xrt. Relate doing well and without complaints. Pt excited for "3 more treatments". Gave pt encouragement and support. Discussed survivorship program and referral. Pt enthusiastic about this appt. Encourage pt to call with needs or questions.

## 2014-12-13 NOTE — Progress Notes (Signed)
Weekly Management Note Current Dose:  57 Gy  Projected Dose: 61 Gy   Narrative:  The patient presents for routine under treatment assessment.  CBCT/MVCT images/Port film x-rays were reviewed.  The chart was checked She has completed 31 treatments. She reports mild fatigue, but is still able to complete normal daily activities. She denies generalized pain, but does report mild tenderness to her left breast and tenderness over her right chest wall. She does report using the radiaplex cream as directed.  Physical Findings: Weight: 143 lb 1.6 oz (64.91 kg). Mild pink skin over the left breast and she has mild tenderness to palpation over her right rib with no palpable masses.  Impression:  The patient is tolerating radiation.  Plan:  Continue treatment as planned.  Continue radiaplex. She is scheduled to complete radiation therapy on 12/15/14. We discussed post-RT skin care. She will monitor her rib pain and will see Dr. Jana Hakim in 3 weeks.  This document serves as a record of services personally performed by Thea Silversmith, MD. It was created on her behalf by Darcus Austin, a trained medical scribe. The creation of this record is based on the scribe's personal observations and the provider's statements to them. This document has been checked and approved by the attending provider.

## 2014-12-13 NOTE — Telephone Encounter (Signed)
lvm for pt regarding to OCT and DEC appt.Laura KitchenMarland KitchenMarland Li

## 2014-12-13 NOTE — Progress Notes (Signed)
Ms. Laura Li presents for her 31st fraction of radiation to her left breast. She reports mild fatigue, but is still able to complete normal daily activities. She denies generalized pain, but does report mild tenderness to her left breast. Her Left breast is red to the anterior and lateral areas. The breast is also red underneath. She does report she is using the radiaplex cream as directed.  BP 117/59 mmHg  Pulse 72  Temp(Src) 97.4 F (36.3 C)  Ht 5\' 6"  (1.676 m)  Wt 143 lb 1.6 oz (64.91 kg)  BMI 23.11 kg/m2  Wt Readings from Last 3 Encounters:  12/13/14 143 lb 1.6 oz (64.91 kg)  12/06/14 143 lb (64.864 kg)  11/29/14 143 lb 14.4 oz (65.273 kg)

## 2014-12-14 ENCOUNTER — Ambulatory Visit
Admission: RE | Admit: 2014-12-14 | Discharge: 2014-12-14 | Disposition: A | Discharge status: Home / Self Care | Payer: Medicare Other | Source: Ambulatory Visit | Attending: Radiation Oncology | Admitting: Radiation Oncology

## 2014-12-14 DIAGNOSIS — C50912 Malignant neoplasm of unspecified site of left female breast: Secondary | ICD-10-CM | POA: Diagnosis not present

## 2014-12-14 DIAGNOSIS — Z510 Encounter for antineoplastic radiation therapy: Secondary | ICD-10-CM | POA: Diagnosis not present

## 2014-12-15 ENCOUNTER — Encounter: Payer: Self-pay | Admitting: Radiation Oncology

## 2014-12-15 ENCOUNTER — Ambulatory Visit
Admission: RE | Admit: 2014-12-15 | Discharge: 2014-12-15 | Disposition: A | Discharge status: Home / Self Care | Payer: Medicare Other | Source: Ambulatory Visit | Attending: Radiation Oncology | Admitting: Radiation Oncology

## 2014-12-15 DIAGNOSIS — C50912 Malignant neoplasm of unspecified site of left female breast: Secondary | ICD-10-CM | POA: Diagnosis not present

## 2014-12-15 DIAGNOSIS — Z510 Encounter for antineoplastic radiation therapy: Secondary | ICD-10-CM | POA: Diagnosis not present

## 2014-12-19 NOTE — Progress Notes (Signed)
Name: Laura Li   MRN: [id number]  Date: 11/29/2014   DOB: 1940-05-15  Status:outpatient    DIAGNOSIS: Breast cancer, left breast ([place])   Staging form: Breast, AJCC 7th Edition     Clinical: Stage IA (T1c, N0, M0) - Signed by Chauncey Cruel, MD on 04/24/2014 Breast cancer, right breast [facility])   Staging form: Breast, AJCC 7th Edition     Clinical: Stage IIA (T2, N0, M0) - Signed by Chauncey Cruel, MD on 04/24/2014 Melanoma of skin [facility])   Staging form: Melanoma of the Skin, AJCC 7th Edition     Clinical: Stage 0 (Tis, N0, M0) - Signed by Chauncey Cruel, MD on 04/24/2014     Pathologic: No stage assigned - Unsigned   CONSENT VERIFIED: yes   SET UP: Patient is setup supine   IMMOBILIZATION:  The following immobilization was used:Custom Moldable Pillow, breast board.   NARRATIVE: Laura Li underwent complex simulation and treatment planning for her boost treatment today.  Her tumor volume was outlined on the planning CT scan.  Due to the depth of her cavity, electrons could not be used and a photon plan was developed. The plan will be prescribed to the  isodose line.   I personally supervised and approved the creation of 2 unique MLCs comprising 2   treatment devices.

## 2014-12-19 NOTE — Progress Notes (Signed)
  Radiation Oncology         [phone] ________________________________  Name: SIRI BUEGE MRN: [id number]  Date: 12/15/2014  DOB: [birth year]  End of Treatment Note  Diagnosis:   Breast cancer, left breast ([place])   Staging form: Breast, AJCC 7th Edition     Clinical: Stage IA (T1c, N0, M0) - Signed by Chauncey Cruel, MD on 04/24/2014 Breast cancer, right breast [facility])   Staging form: Breast, AJCC 7th Edition     Clinical: Stage IIA (T2, N0, M0) - Signed by Chauncey Cruel, MD on 04/24/2014 Melanoma of skin [facility])   Staging form: Melanoma of the Skin, AJCC 7th Edition     Clinical: Stage 0 (Tis, N0, M0) - Signed by Chauncey Cruel, MD on 04/24/2014     Pathologic: No stage assigned - Unsigned      Indication for treatment:  Curative      Radiation treatment dates:   10/31/2014-12/15/2014  Site/dose:   Left breast/ 45 Gy at 1.8 Gy per fraction x 25 fractions.  Left breast boost/ 16 Gy at 2 Gy per fraction x 8 fractions  Beams/energy:  Opposed tangents with reduced fields / 6 MV photons Mini tangents with 15 MV photons.  Narrative: The patient tolerated radiation treatment relatively well.   She had minimal skin redness and irritation.   Plan: The patient has completed radiation treatment. The patient will return to radiation oncology clinic for routine followup in one month. I advised them to call or return sooner if they have any questions or concerns related to their recovery or treatment.  ------------------------------------------------  Thea Silversmith, MD

## 2014-12-29 ENCOUNTER — Encounter: Payer: Self-pay | Admitting: Radiation Oncology

## 2014-12-29 NOTE — Progress Notes (Signed)
Radiation Oncology         (336) [phone] ________________________________  Name: Laura Li      MRN: [id number]          Date: 10/20/2014                  DOB: 1940-07-18  Optical Surface Tracking Plan:  Since intensity modulated radiotherapy (IMRT) and 3D conformal radiation treatment methods are predicated on accurate and precise positioning for treatment, intrafraction motion monitoring is medically necessary to ensure accurate and safe treatment delivery.  The ability to quantify intrafraction motion without excessive ionizing radiation dose can only be performed with optical surface tracking. Accordingly, surface imaging offers the opportunity to obtain 3D measurements of patient position throughout IMRT and 3D treatments without excessive radiation exposure.  I am ordering optical surface tracking for this patient's upcoming course of radiotherapy.  This document serves as a record of services personally performed by Thea Silversmith , MD. It was created on her behalf by Lenn Cal, a trained medical scribe. The creation of this record is based on the scribe's personal observations and the provider's statements to them. This document has been checked and approved by the attending provider.   ------------------------------------------------  Thea Silversmith, MD     Reference:   Particia Jasper, et al. Surface imaging-based analysis of intrafraction motion for breast radiotherapy patients.Journal of [place], n. 6, nov. 2014. ISSN [id number].   Available at: <http://www.jacmp.org/index.php/jacmp/article/view/4957>.

## 2015-01-05 ENCOUNTER — Other Ambulatory Visit (HOSPITAL_BASED_OUTPATIENT_CLINIC_OR_DEPARTMENT_OTHER): Payer: Medicare Other

## 2015-01-05 ENCOUNTER — Ambulatory Visit (HOSPITAL_BASED_OUTPATIENT_CLINIC_OR_DEPARTMENT_OTHER): Payer: Medicare Other | Admitting: Oncology

## 2015-01-05 ENCOUNTER — Telehealth: Payer: Self-pay | Admitting: Oncology

## 2015-01-05 ENCOUNTER — Encounter: Payer: Self-pay | Admitting: *Deleted

## 2015-01-05 VITALS — BP 136/56 | HR 67 | Temp 97.6°F | Resp 18 | Ht 66.0 in | Wt 145.0 lb

## 2015-01-05 DIAGNOSIS — Z853 Personal history of malignant neoplasm of breast: Secondary | ICD-10-CM

## 2015-01-05 DIAGNOSIS — C50912 Malignant neoplasm of unspecified site of left female breast: Secondary | ICD-10-CM

## 2015-01-05 DIAGNOSIS — C50512 Malignant neoplasm of lower-outer quadrant of left female breast: Secondary | ICD-10-CM | POA: Diagnosis not present

## 2015-01-05 DIAGNOSIS — Z171 Estrogen receptor negative status [ER-]: Secondary | ICD-10-CM | POA: Diagnosis not present

## 2015-01-05 DIAGNOSIS — C50911 Malignant neoplasm of unspecified site of right female breast: Secondary | ICD-10-CM

## 2015-01-05 LAB — COMPREHENSIVE METABOLIC PANEL (CC13)
ALT: 12 U/L (ref 0–55)
AST: 18 U/L (ref 5–34)
Albumin: 4.1 g/dL (ref 3.5–5.0)
Alkaline Phosphatase: 91 U/L (ref 40–150)
Anion Gap: 6 mEq/L (ref 3–11)
BUN: 18.4 mg/dL (ref 7.0–26.0)
CHLORIDE: 106 meq/L (ref 98–109)
CO2: 29 mEq/L (ref 22–29)
Calcium: 10.1 mg/dL (ref 8.4–10.4)
Creatinine: 1.1 mg/dL (ref 0.6–1.1)
EGFR: 50 mL/min/{1.73_m2} — ABNORMAL LOW (ref >90)
GLUCOSE: 119 mg/dL (ref 70–140)
POTASSIUM: 4.6 meq/L (ref 3.5–5.1)
SODIUM: 141 meq/L (ref 136–145)
TOTAL PROTEIN: 7.4 g/dL (ref 6.4–8.3)
Total Bilirubin: 0.36 mg/dL (ref 0.20–1.20)

## 2015-01-05 LAB — CBC WITH DIFFERENTIAL/PLATELET
BASO%: 0.9 % (ref 0.0–2.0)
Basophils Absolute: 0 10e3/uL (ref 0.0–0.1)
EOS%: 4.3 % (ref 0.0–7.0)
Eosinophils Absolute: 0.2 10e3/uL (ref 0.0–0.5)
HCT: 36.8 % (ref 34.8–46.6)
HGB: 12.6 g/dL (ref 11.6–15.9)
LYMPH%: 24.4 % (ref 14.0–49.7)
MCH: 30.7 pg (ref 25.1–34.0)
MCHC: 34.1 g/dL (ref 31.5–36.0)
MCV: 90.1 fL (ref 79.5–101.0)
MONO#: 0.4 10e3/uL (ref 0.1–0.9)
MONO%: 9.7 % (ref 0.0–14.0)
NEUT#: 2.4 10e3/uL (ref 1.5–6.5)
NEUT%: 60.7 % (ref 38.4–76.8)
Platelets: 194 10e3/uL (ref 145–400)
RBC: 4.09 10e6/uL (ref 3.70–5.45)
RDW: 13.2 % (ref 11.2–14.5)
WBC: 3.9 10e3/uL (ref 3.9–10.3)
lymph#: 1 10e3/uL (ref 0.9–3.3)

## 2015-01-05 NOTE — Telephone Encounter (Signed)
Appointments made and avs printed for patient °

## 2015-01-05 NOTE — Progress Notes (Signed)
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 Non-of the 25 lymph nodes removed from the right axilla were involved. The tumor was estrogen and progesterone receptor positive, HER-2 not amplified. She was treated adjuvantly with doxorubicin and cyclophosphamide 4, then received 5 years of antiestrogen therapy (initially tamoxifen, later letrozole). There has been no evidence of disease recurrence on the right.  More recently, Laura Li had routine left screening mamm[facility] 2014. This showed a suspicious area of architectural distortion in the inferior portion of the left breast. On 28/41/3244 Laura Li underwent left diagnostic mammography with ultrasonography. Spot compression views confirmed the presence 7 area of distortion in the lower central left breast, which was not palpable by physical exam (there was a prior biopsy scar in the upper outer quadrant of the left breast). Ultrasound confirmed a hypoechoic spiculated mass at the 6:00 location in the left breast, measuring maximally 1.3 cm. Sonography of the left axilla was negative.  Biopsy of the left breast mass in question 120 10/29/2014 showed ([id number]) an invasive breast cancer with squamous features. It was estrogen and progesterone receptor negative. There was no HER-2 amplification, the signals ratio being 0.82 and the number per cell 1.80. The  MIB-1 was 10%.  The patient has met with Dr. Donne Hazel in surgery and Dr. Pablo Ledger in radiation oncology. The patient understands there is no survival difference between lumpectomy and radiation compared with mastectomy. The patient will need sentinel lymph node sampling with either of those procedures. If she does have a lumpectomy, she will benefit from adjuvant radiation. The patient has been set up for genetics testing and this may affect her ultimate surgical decision.  Her subsequent history is as detailed below  INTERVAL HISTORY: Laura Li returns today for follow-up of her breast cancer, accompanied by her daughter, Laura Li. The interval history is significant for her having completed adjuvant radiation earlier this month. She did feel fatigued, and is still not back to her usual level of function. She also had significant skin changes. She is still using the radial plaques although things generally are better. Also her hair is coming back in.  REVIEW OF SYSTEMS: The biggest problem she is having is I dryness. It is worse at night. She was started on loteprednol by her ophthalmologist but it really didn't work very well. She is using a couple of over-the-counter drops for relief. She has some arthritic pains here and there but these are not more intense or persistent than before. Aside from these issues a detailed review of systems today was stable  PAST MEDICAL HISTORY: Past Medical History  Diagnosis Date  . OA (osteoarthritis)   . History of DVT (deep vein thrombosis)   . Hyperlipidemia   . Hypertension   . Hypothyroidism   . HX: breast cancer     melanoma  . Melanoma ([place])   . Eczema   . Arthritis   . Esophageal stricture   . GERD (gastroesophageal reflux  disease)   . PONV (postoperative nausea and vomiting)   . Wears glasses   . Depression     states no depression  . Chronic kidney disease     some renal impairment from meds  . Headache   . Anemia     from medicaions  .  Heart murmur     states it is benign told 30-40 years ago  . Pneumothorax on right 06/15/2014    PAST SURGICAL HISTORY: Past Surgical History  Procedure Laterality Date  . Appendectomy  1967  . Melanoma excision  2004    right side of face  . 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 Total abdominal hysterectomy  1992    including ovaries  . Tonsillectomy and adenoidectomy  1949  . Lipoma excision  1978    right breast  . Mastectomy  1998    right-nodes out  . Breast lumpectomy      left breast-benign  . Breast surgery  1977    removal of calcified milk gland right breast  . Colonoscopy    . Radioactive seed guided mastectomy with axillary sentinel lymph node biopsy Left 05/05/2014    Procedure: RADIOACTIVE SEED GUIDED LEFT LUMPECTOMY WITH AXILLARY SENTINEL LYMPH NODE BIOPSY;  Surgeon: Rolm Bookbinder, MD;  Location: [facility];  Service: General;  Laterality: Left;  . Chest tube insertion  06/15/2014  . Portacath placement Left 06/15/2014    Procedure: INSERTION PORT-A-CATH;  Surgeon: Rolm Bookbinder, MD;  Location: [facility];  Service: General;  Laterality: Left;  . Chest tube insertion Bilateral 06/15/2014    Procedure: CHEST TUBE INSERTION;  Surgeon: Rolm Bookbinder, MD;  Location: [facility];  Service: General;  Laterality: Bilateral;  . Port-a-cath removal N/A 10/05/2014    Procedure: REMOVAL PORT-A-CATH;  Surgeon: Rolm Bookbinder, MD;  Location: [facility];  Service: General;  Laterality: N/A;    FAMILY HISTORY Family History  Problem Relation Age of Onset  . Heart disease Father 43    smoker  . Endometrial cancer Mother 48  . Breast cancer Cousin     x 3 cousins with breast cancer (one at 77, one at 79 and one at 70)  . Lung cancer Paternal Aunt     smoker  . Brain cancer Maternal Aunt   . Colon cancer Neg Hx   . Diabetes Maternal Aunt     ???  . Breast cancer Maternal Aunt 51  The patient's father died at the age of 36 from a  myocardial infarction. The patient's mother died at the age of 47 from metastatic endometrial cancer which had been diagnosed 2 years before. The patient had no brothers, one sister. The patient's mother's sister was diagnosed with breast cancer but the patient does not know at what age. A second maternal aunt was diagnosed with brain cancer. The patient has 3 cousins on her mother's side diagnosed with breast cancer, one of them under the age of 65. On the father's side there is a history of lung and cervical cancer  GYNECOLOGIC HISTORY:  No LMP recorded. Patient has had a hysterectomy. Menarche age 7, first live birth age 21. The patient is GX P1. She underwent hysterectomy with bilateral salpingo-oophorectomy in 1991. She used hormone replacement for approximately 7 years until 1998, when she had her right-sided breast cancer. She used oral contraceptives for approximately one year with no complications. 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 She is widowed and lives alone with no pets. Her main hobby is painting. Her daughter Faith Rogue lives in [place] and is also a homemaker. The patient has 2 grandchildren. She attends wholly [place] [employer]. The patient's daughter Laura Li is her healthcare power of attorney. Laura Li can be reached at [phone]   HEALTH MAINTENANCE: Social History  Substance Use Topics  . Smoking status: Former Smoker -- 0.75 packs/day for 3 years    Types: Cigarettes    Quit date: 03/11/1969  . Smokeless tobacco: Never Used  . Alcohol Use: 0.0 oz/week    0 Standard drinks or equivalent per week     Comment: occasional wine     Colonoscopy: 20/10/[facility] Past[facility]  QHU:TMLYYT post hysterectomy  Bone density:2014/osteopenia  Lipid panel:  Allergies  Allergen Reactions  . Dyazide  [Hydrochlorothiazide W-Triamterene]     Presumption: drug induced vasculitis  . Niacin Other (See Comments)    flushing  . Penicillins Diarrhea and Nausea And Vomiting    Current Outpatient Prescriptions  Medication Sig Dispense Refill  . acetaminophen (TYLENOL) 500 MG tablet Take 500 mg by mouth 2 (two) times daily as needed for mild pain.     [name] atorvastatin (LIPITOR) 20 MG tablet Take 1 tablet (20 mg total) by mouth daily. 90 tablet 3  . Calcium-Vitamin D (CALTRATE 600 PLUS-VIT D PO) Take 1 tablet by mouth 2 (two) times daily.     [name] docusate sodium (COLACE) 100 MG capsule Take 1 capsule (100 mg total) by mouth 2 (two) times daily. 40 capsule 0  . esomeprazole (NEXIUM) 40 MG capsule TAKE 1 CAPSULE DAILY (Patient taking differently: Take 40 mg by mouth daily. TAKE 1 CAPSULE DAILY) 90 capsule 3  . fexofenadine (ALLEGRA) 180 MG tablet Take 180 mg by mouth daily as needed for allergies.     . hyaluronate sodium (RADIAPLEXRX) GEL Apply 1 application topically 2 (two) times daily.    [name] ipratropium (ATROVENT) 0.03 % nasal spray Place 2 sprays into the nose every 12 (twelve) hours. 180 mL 4  . levothyroxine (SYNTHROID, LEVOTHROID) 75 MCG tablet Take 1 tablet (75 mcg total) by mouth daily. 90 tablet 2  . loteprednol (LOTEMAX) 0.5 % ophthalmic suspension Place into both eyes 4 (four) times daily. 5 mL 0  . nadolol (CORGARD) 20 MG tablet Take 0.5 tablets (10 mg total) by mouth daily. 90 tablet 1  . non-metallic deodorant (ALRA) MISC Apply 1 application topically daily as needed.    . venlafaxine XR (EFFEXOR-XR) 150 MG 24 hr capsule Take 1 capsule (150 mg total) by mouth daily. 90 capsule 3  . Wound Cleansers (RADIAPLEX EX) Apply 170 g topically.     No current facility-administered medications for this visit.    OBJECTIVE: Older white woman who appears well Filed Vitals:   01/05/15 0901  BP: 136/56  Pulse: 67  Temp: 97.6 F (36.4 C)  Resp: 18     Body mass index is 23.41 kg/(m^2).    ECOG  FS:1 - Symptomatic but completely ambulatory  Sclerae unicteric, EOMs intact Oropharynx clear, dentition in good repair No cervical or supraclavicular adenopathy Lungs no rales or rhonchi Heart regular rate and rhythm Abd soft, nontender, positive bowel sounds MSK no focal spinal tenderness, no upper extremity lymphedema Neuro: nonfocal, well oriented, appropriate affect Breasts: The right breast is  status post mastectomy. There is no evidence of chest wall recurrence. The right axilla is benign. The left breast is status post lumpectomy and radiation. There is no evidence of local recurrence The cosmetic result is excellent. There are no significant skin changes. Left axilla is benign.    LAB RESULTS:  CMP     Component Value Date/Time   NA 141 10/11/2014 0901   NA 138 06/14/2014 1025   K 4.1 10/11/2014 0901   K 4.1 06/14/2014 1025   CL 100 06/14/2014 1025   CO2 29 10/11/2014 0901   CO2 33* 06/14/2014 1025   GLUCOSE 92 10/11/2014 0901   GLUCOSE 79 06/14/2014 1025   GLUCOSE 88 02/06/2006 1059   BUN 11.6 10/11/2014 0901   BUN 16 06/14/2014 1025   CREATININE 1.0 10/11/2014 0901   CREATININE 0.97 06/14/2014 1025   CALCIUM 9.8 10/11/2014 0901   CALCIUM 9.9 06/14/2014 1025   PROT 7.1 10/11/2014 0901   PROT 8.5* 12/21/2013 1004   ALBUMIN 4.1 10/11/2014 0901   ALBUMIN 4.2 12/21/2013 1004   AST 22 10/11/2014 0901   AST 24 12/21/2013 1004   ALT 16 10/11/2014 0901   ALT 19 12/21/2013 1004   ALKPHOS 85 10/11/2014 0901   ALKPHOS 64 12/21/2013 1004   BILITOT 0.51 10/11/2014 0901   BILITOT 0.8 12/21/2013 1004   GFRNONAA 56* 06/14/2014 1025   GFRAA 65* 06/14/2014 1025    INo results found for: SPEP, UPEP  Lab Results  Component Value Date   WBC 3.9 01/05/2015   NEUTROABS 2.4 01/05/2015   HGB 12.6 01/05/2015   HCT 36.8 01/05/2015   MCV 90.1 01/05/2015   PLT 194 01/05/2015      Chemistry      Component Value Date/Time   NA 141 10/11/2014 0901   NA 138 06/14/2014 1025    K 4.1 10/11/2014 0901   K 4.1 06/14/2014 1025   CL 100 06/14/2014 1025   CO2 29 10/11/2014 0901   CO2 33* 06/14/2014 1025   BUN 11.6 10/11/2014 0901   BUN 16 06/14/2014 1025   CREATININE 1.0 10/11/2014 0901   CREATININE 0.97 06/14/2014 1025      Component Value Date/Time   CALCIUM 9.8 10/11/2014 0901   CALCIUM 9.9 06/14/2014 1025   ALKPHOS 85 10/11/2014 0901   ALKPHOS 64 12/21/2013 1004   AST 22 10/11/2014 0901   AST 24 12/21/2013 1004   ALT 16 10/11/2014 0901   ALT 19 12/21/2013 1004   BILITOT 0.51 10/11/2014 0901   BILITOT 0.8 12/21/2013 1004       No results found for: LABCA2  No components found for: LABCA125  No results for input(s): INR in the last 168 hours.  Urinalysis    Component Value Date/Time   COLORURINE YELLOW 05/17/2013 1330   APPEARANCEUR CLEAR 05/17/2013 1330   LABSPEC 1.005 08/09/2014 1052   LABSPEC 1.025 05/17/2013 1330   PHURINE 7.0 08/09/2014 1052   PHURINE 5.5 05/17/2013 1330   GLUCOSEU Negative 08/09/2014 1052   GLUCOSEU NEGATIVE 05/17/2013 1330   HGBUR Negative 08/09/2014 1052   HGBUR NEGATIVE 05/17/2013 1330   BILIRUBINUR Negative 08/09/2014 1052   BILIRUBINUR n 06/21/2013 1115   BILIRUBINUR NEGATIVE 05/17/2013 1330   KETONESUR Negative 08/09/2014 1052   KETONESUR NEGATIVE 05/17/2013 1330   PROTEINUR Negative 08/09/2014 1052   PROTEINUR n 06/21/2013 1115   PROTEINUR NEGATIVE 05/17/2013 1330   UROBILINOGEN 0.2 08/09/2014 1052   UROBILINOGEN 0.2 06/21/2013 1115   UROBILINOGEN 0.2 05/17/2013 1330  NITRITE Negative 08/09/2014 1052   NITRITE n 06/21/2013 1115   NITRITE NEGATIVE 05/17/2013 1330   LEUKOCYTESUR Negative 08/09/2014 1052   LEUKOCYTESUR Trace 06/21/2013 1115    STUDIES: No results found.  ASSESSMENT: 73 y.o. [place] woman  (1) status post right modified radical mastectomy November 1998 for a pT2 pN0, stage IIA invasive ductal carcinoma, grade 2, estrogen and progesterone receptor positive, HER-2 not  amplified  (a) adjuvant chemotherapy consisted of doxorubicin and cyclophosphamide 4  (b) adjuvant anti-estrogens consisted of tamoxifen, then letrozole, for a total of 5 years  (2) status post left breast lower outer quadrant biopsy 04/07/2014 for a clinical T1c N0,stage IA  invasive ductal carcinoma, with squamous features, estrogen and progesterone receptor negative, HER-2 not amplified, with an MIB-1 of 10%   (3) left lumpectomy and sentinel lymph node biopsy 05/05/2014 showed aT1c pN0, stage IA invasive ductal carcinoma, grade 1, triple negative, with close but negative margins.   (4) Mammaprint classifies the tumor as basal like, high risk, and predicts a 30% chance of recurrence within 10 years with local treatment only.   (5) start of treatment delayed because of bilateral pneumothoraces after port placement. Adjuvant chemotherapy fiinally started 07/11/2014 consisting of carboplatin and docetaxel given every 21 days 4, completed 09/13/2014  (6) adjuvant radiation 10/31/2014-12/15/2014:  Left breast/ 45 Gy at 1.8 Gy per fraction x 25 fractions.  Left breast boost/ 16 Gy at 2 Gy per fraction x 8 fractions  (7) genetics testing March 2016 showed no deleterious mutation in the [employer] panel  [[facility]] including sequencing and rearrangement analysis for the following 24 genes:ATM, BARD1, BRCA1, BRCA2, BRIP1, CDH1, CHEK2, EPCAM, MLH1, MRE11A, MSH2, MSH6, MUTYH, NBN, NF1, PALB2, PMS2, PTEN, RAD50, RAD51C, RAD51D, SMARCA4, STK11, and TP53.   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    PLAN: Asna has completed her active treatment and actually looks terrific at this point. Of course it will be several months before she really regains her stride. She is can have a little bit more arthritis, a little bit more dry skin, and of course she still having the eye problems. All that should be improving over the next several  months so I think by the time she sees [place] in January a lot of that should have resolved.  She is doing much better on 150 mg of venlafaxine and her daughter came in particular is very insistent that we not drop back to 75 because "mom is doing so much better" on this dose. Tasnim is agreeable.  I have encouraged her to exercise and she tells me she has a new studio and that there is an exercise area there that she intends to use. We do need to continue to encourage her.  She brought [place] some cards of some of her paintings which are absolutely lovely.  At this point we're going to start seeing her on an every three-month basis for the next year. She knows to call for any problems that may develop before her next visit here.    Chauncey Cruel, MD   01/05/2015 9:09 AM

## 2015-01-19 ENCOUNTER — Encounter: Payer: Self-pay | Admitting: Radiation Oncology

## 2015-01-19 ENCOUNTER — Ambulatory Visit
Admission: RE | Admit: 2015-01-19 | Discharge: 2015-01-19 | Disposition: A | Discharge status: Home / Self Care | Payer: Medicare Other | Source: Ambulatory Visit | Attending: Radiation Oncology | Admitting: Radiation Oncology

## 2015-01-19 VITALS — BP 142/68 | HR 65 | Temp 97.5°F | Resp 12 | Wt 146.5 lb

## 2015-01-19 DIAGNOSIS — C50512 Malignant neoplasm of lower-outer quadrant of left female breast: Secondary | ICD-10-CM

## 2015-01-19 NOTE — Progress Notes (Signed)
NURSING EVALUATION  Radiation Oncology: Dr. Thea Silversmith  27-month follow-up for: TIcN0 Invasive Ductal Carcinoma of the Left Breast, Triple Negative; h/o Stage IIA right breast cancer treated with mastectomy, chemotherapy, and anti-estrogens.  Final RT date: 12/15/14-Left breast/ 45 Gy at 1.8 Gy per fraction x 25 fractions. Left breast boost/ 16 Gy at 2 Gy per fraction x 8 fractions  Adjuvant anti-estrogen therapy: None; triple negative.   Last Med Onc visit ([facility]): 01/05/15   Vitals: BP 142/68 mmHg  Pulse 65  Temp(Src) 97.5 F (36.4 C) (Oral)  Resp 12  Wt 146 lb 8 oz (66.452 kg)  SpO2 100%  Current complaints/issues: Skin: slight erythema esp along bra lines, with pruritis.  Using Radiaplex.   She will see our Chestine Spore, NP in survivorship on 02/14/15.  She will see Gentry Fitz, NP for surveillance follow-up visit on 04/06/15 and Dr. Jana Hakim on 07/13/15.    Mike Craze, NP 01/19/15

## 2015-01-19 NOTE — Progress Notes (Signed)
   Department of Radiation Oncology  Phone:  [phone] Fax:        [phone]   Name: Laura Li MRN: [id number]  DOB: [birth year]  Date: 01/19/2015  Follow Up Visit Note  Diagnosis: Breast cancer, left breast ([place])   Staging form: Breast, AJCC 7th Edition     Clinical: Stage IA (T1c, N0, M0) - Signed by Chauncey Cruel, MD on 04/24/2014 Breast cancer, right breast [facility])   Staging form: Breast, AJCC 7th Edition     Clinical: Stage IIA (T2, N0, M0) - Signed by Chauncey Cruel, MD on 04/24/2014 Melanoma of skin [facility])   Staging form: Melanoma of the Skin, AJCC 7th Edition     Clinical: Stage 0 (Tis, N0, M0) - Signed by Chauncey Cruel, MD on 04/24/2014     Pathologic: No stage assigned - Unsigned  Summary and Interval since last radiation: 45 Gy in 25 fractions with boost completed 12/15/2014.  Interval History: Laura Li presents today for routine followup. She is doing well. She has short term memory loss which is frustrating for her. She is playing [place] and doing crossword puzzles. She is having soreness of left breast when wearing a bra.  Physical Exam:  Filed Vitals:   01/19/15 1438  BP: 142/68  Pulse: 65  Temp: 97.5 F (36.4 C)  TempSrc: Oral  Resp: 12  Weight: 146 lb 8 oz (66.452 kg)  SpO2: 100%  Skin is well healed. Erythema in inframammary fold of left breast.  IMPRESSION: Porshe is a 74 y.o. female with Stage IA breast cancer in the left breast with resolving acute effects from treatment.  PLAN: She is doing well. We discussed the need for yearly mammograms which she can schedule with her OBGYN or with medical oncology. We discussed the need for sun protection in the treated area.  She can always call me with questions.  I will follow up with her on an as needed basis. She is following up with Survivorship on 02/14/2015. She is also following up with medical oncology with Healther Boelter on 04/06/2015 and. Dr. Jana Hakim on 07/13/2015.   Thea Silversmith,  MD    This document serves as a record of services personally performed by Thea Silversmith, MD. It was created on her behalf by  Lendon Collar, a trained medical scribe. The creation of this record is based on the scribe's personal observations and the provider's statements to them. This document has been checked and approved by the attending provider.

## 2015-01-24 DIAGNOSIS — H04123 Dry eye syndrome of bilateral lacrimal glands: Secondary | ICD-10-CM | POA: Diagnosis not present

## 2015-01-24 DIAGNOSIS — H25813 Combined forms of age-related cataract, bilateral: Secondary | ICD-10-CM | POA: Diagnosis not present

## 2015-02-14 ENCOUNTER — Encounter: Payer: Self-pay | Admitting: Nurse Practitioner

## 2015-02-14 ENCOUNTER — Ambulatory Visit (HOSPITAL_BASED_OUTPATIENT_CLINIC_OR_DEPARTMENT_OTHER): Payer: Medicare Other | Admitting: Nurse Practitioner

## 2015-02-14 VITALS — BP 134/46 | HR 66 | Temp 97.6°F | Resp 18 | Ht 66.0 in | Wt 147.6 lb

## 2015-02-14 DIAGNOSIS — E785 Hyperlipidemia, unspecified: Secondary | ICD-10-CM

## 2015-02-14 DIAGNOSIS — Z853 Personal history of malignant neoplasm of breast: Secondary | ICD-10-CM

## 2015-02-14 DIAGNOSIS — C50912 Malignant neoplasm of unspecified site of left female breast: Secondary | ICD-10-CM

## 2015-02-14 NOTE — Progress Notes (Signed)
CLINIC:  Cancer Survivorship   REASON FOR VISIT:  Routine follow-up post-treatment for a recent history of breast cancer.  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 Sonography of the left axilla was negative.    04/07/2014 Initial Biopsy Left breast core needle bx: Invasive carcinoma, likely mammary, with squamous features. ER- (0%), PR- (0%), HER2/neu negative (ratio 0.82), Ki67 10%   04/07/2014 Clinical Stage Stage IA: T1c N0   05/05/2014 Definitive Surgery Left lumpectomy/[facility] Dwain Sarna): Invasive ductal carcinoma, grade 1, triple negative, with close but negative margins.    05/05/2014 Pathologic Stage Stage IA: pT1c pN0   05/05/2014 Procedure Mammaprint classifies the tumor as basal like, high risk, and predicts a 30% chance of recurrence within 10 years with local treatment only.    05/30/2014 Procedure OvaNext panel reveals 2 VUS at MLH1, c.-230G>C and NBN, p.T76N.  Otherwise, negative at  ATM, BARD1, BRCA1, BRCA2, BRIP1, CDH1, CHEK2, EPCAM, MLH1, MRE11A, MSH2, MSH6, MUTYH, NBN, NF1, PALB2, PMS2, PTEN, RAD50, RAD51C, RAD51D, SMARCA4, STK11, and TP53.     07/11/2014 - 09/13/2014 Chemotherapy Adjuvant docetaxel and carboplatin q 3 weeks x 4 cycles   10/31/2014 - 12/15/2014 Radiation Therapy Adjuvant RT Michell Heinrich): Left breast 45 Gy over 25 fractions; Left breast boost 16 Gy over 8 fractions. Total dose: 61 Gy.    INTERVAL  HISTORY:  Laura Li presents to the Survivorship Clinic today for our initial meeting to review her survivorship care plan detailing her treatment course for breast cancer, as well as monitoring long-term side effects of that treatment, education regarding health maintenance, screening, and overall wellness and health promotion.     Overall, Laura Li reports feeling quite well since completing her radiation therapy approximately two months ago.  She remains fatigued and states the skin changes overlying her breast are better, but not yet resolved.  She denies headache, cough, shortness of breath or bone pain.  She has a good appetite and denies any weight loss.    REVIEW OF SYSTEMS:  General: Fatigue as above. Denies fever, chills, or night sweats. HEENT: Wears glasses.  Denies visual changes, hearing loss, mouth sores or difficulty swallowing. Cardiac: Denies palpitations, chest pain, and lower extremity edema.  Respiratory: Denies wheeze or dyspnea on exertion.  Breast: Denies any new nodularity, masses, tenderness, nipple changes, or nipple discharge.  GI: Denies abdominal pain, constipation, diarrhea, nausea, or vomiting.  GU: Denies dysuria, hematuria, vaginal bleeding, vaginal discharge, or vaginal dryness.  Musculoskeletal: Denies joint or bone pain.  Neuro: Denies recent fall or numbness / tingling in her extremities. Skin: Denies rash, pruritis, or open wounds.  Psych: Denies depression, anxiety, insomnia, or memory loss.   A 14-point review of systems was completed and was negative, except as noted above.   ONCOLOGY TREATMENT TEAM:  1. Surgeon:  Dr. Dwain Sarna at [facility]  2. Medical Oncologist: Dr. Darnelle Catalan 3. Radiation Oncologist: Dr. Michell Heinrich    PAST MEDICAL/SURGICAL HISTORY:  Past Medical  History  Diagnosis Date  . OA (osteoarthritis)   . History of DVT (deep vein thrombosis)   . Hyperlipidemia   . Hypertension   . Hypothyroidism   . HX: breast  cancer     melanoma  . Melanoma (Garza-Salinas II)   . Eczema   . Arthritis   . Esophageal stricture   . GERD (gastroesophageal reflux disease)   . PONV (postoperative nausea and vomiting)   . Wears glasses   . Depression     states no depression  . Chronic kidney disease     some renal impairment from meds  . Headache   . Anemia     from medicaions  . Heart murmur     states it is benign told 30-40 years ago  . Pneumothorax on right 06/15/2014   Past Surgical History  Procedure Laterality Date  . Appendectomy  1967  . Melanoma excision  2004    right side of face  . 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 Total abdominal hysterectomy  1992    including ovaries  . Tonsillectomy and adenoidectomy  1949  . Lipoma excision  1978    right breast  . Mastectomy  1998    right-nodes out  . Breast lumpectomy      left breast-benign  . Breast surgery  1977    removal of calcified milk gland right breast  . Colonoscopy    . Radioactive seed guided mastectomy with axillary sentinel lymph node biopsy Left 05/05/2014    Procedure: RADIOACTIVE SEED GUIDED LEFT LUMPECTOMY WITH AXILLARY SENTINEL LYMPH NODE BIOPSY;  Surgeon: Rolm Bookbinder, MD;  Location: [place];  Service: General;  Laterality: Left;  . Chest tube insertion  06/15/2014  . Portacath placement Left 06/15/2014    Procedure: INSERTION PORT-A-CATH;  Surgeon: Rolm Bookbinder, MD;  Location: [place];  Service: General;  Laterality: Left;  . Chest tube insertion Bilateral 06/15/2014    Procedure: CHEST TUBE INSERTION;  Surgeon: Rolm Bookbinder, MD;  Location: [place];  Service: General;  Laterality: Bilateral;  . Port-a-cath removal N/A 10/05/2014    Procedure: REMOVAL PORT-A-CATH;  Surgeon: Rolm Bookbinder, MD;  Location: [place];  Service: General;  Laterality: N/A;     ALLERGIES:  Allergies  Allergen Reactions  . Dyazide [Hydrochlorothiazide W-Triamterene]     Presumption: drug induced  vasculitis  . Niacin Other (See Comments)    flushing  . Penicillins Diarrhea and Nausea And Vomiting     CURRENT MEDICATIONS:  Current Outpatient Prescriptions on File Prior to Visit  Medication Sig Dispense Refill  . acetaminophen (TYLENOL) 500 MG tablet Take 500 mg by mouth 2 (two) times daily as needed for mild pain.     [name] atorvastatin (LIPITOR) 20 MG tablet Take 1 tablet (20 mg total) by mouth daily. 90 tablet 3  . Calcium-Vitamin D (CALTRATE 600 PLUS-VIT D PO) Take 1 tablet by mouth 2 (two) times daily.     [name] esomeprazole (NEXIUM) 40 MG capsule TAKE 1 CAPSULE DAILY (Patient taking differently: Take 40 mg by mouth daily. TAKE 1 CAPSULE DAILY) 90 capsule 3  . fexofenadine (ALLEGRA) 180 MG tablet Take 180 mg by mouth daily as needed for allergies.     . hyaluronate sodium (RADIAPLEXRX) GEL Apply 1 application topically 2 (two) times daily.    [name] ipratropium (ATROVENT) 0.03 % nasal spray Place 2 sprays into the nose every 12 (twelve) hours. 180 mL 4  . levothyroxine (  SYNTHROID, LEVOTHROID) 75 MCG tablet Take 1 tablet (75 mcg total) by mouth daily. 90 tablet 2  . nadolol (CORGARD) 20 MG tablet Take 0.5 tablets (10 mg total) by mouth daily. 90 tablet 1  . venlafaxine XR (EFFEXOR-XR) 150 MG 24 hr capsule Take 1 capsule (150 mg total) by mouth daily. 90 capsule 3   No current facility-administered medications on file prior to visit.     ONCOLOGIC FAMILY HISTORY:  Family History  Problem Relation Age of Onset  . Heart disease Father 59    smoker  . Endometrial cancer Mother 70  . Breast cancer Cousin     x 3 cousins with breast cancer (one at 20, one at 53 and one at 41)  . Lung cancer Paternal Aunt     smoker  . Brain cancer Maternal Aunt   . Colon cancer Neg Hx   . Diabetes Maternal Aunt     ???  . Breast cancer Maternal Aunt 51     GENETIC COUNSELING/TESTING: Yes, performed 05/30/2014. OvaNext panel reveals 2 variants of unknown significance at MLH1, c.-230G>C and NBN,  p.T76N.  Otherwise, negative at  ATM, BARD1, BRCA1, BRCA2, BRIP1, CDH1, CHEK2, EPCAM, MLH1, MRE11A, MSH2, MSH6, MUTYH, NBN, NF1, PALB2, PMS2, PTEN, RAD50, RAD51C, RAD51D, SMARCA4, STK11, and TP53.       SOCIAL HISTORY:  Laura Li is widowed and lives alone in [place], [place].  She has 1 child and two grandchildren. Laura Li is currently retired.  She is a former smoker, and denies any current tobacco use.  She uses alcohol on occasion.  She denies any illicit drug use.    PHYSICAL EXAMINATION:  Vital Signs: Filed Vitals:   02/14/15 1411  BP: 134/46  Pulse: 66  Temp: 97.6 F (36.4 C)  Resp: 18   ECOG Performance Status: 0  General: Well-nourished, well-appearing female in no acute distress.  She is unaccompanied in clinic today.   HEENT: Head is atraumatic and normocephalic.  Pupils equal and reactive to light and accomodation. Conjunctivae clear without exudate.  Sclerae anicteric. Oral mucosa is pink, moist, and intact without lesions.  Oropharynx is pink without lesions or erythema.  Lymph: No cervical, supraclavicular, infraclavicular, or axillary lymphadenopathy noted on palpation.  Cardiovascular: Regular rate and rhythm without murmurs, rubs, or gallops. Respiratory: Clear to auscultation bilaterally. Chest expansion symmetric without accessory muscle use on inspiration or expiration.  GI: Abdomen soft and round. No tenderness to palpation. Bowel sounds normoactive in 4 quadrants. GU: Deferred.  Musculoskeletal: Muscle strength 5/5 in all extremities.   Neuro: No focal deficits. Steady gait.  Psych: Mood and affect normal and appropriate for situation.  Extremities: No edema, cyanosis, or clubbing.  Skin: Warm and dry. No open lesions noted.   LABORATORY DATA:  None for this visit.  DIAGNOSTIC IMAGING:  None for this visit.     ASSESSMENT AND PLAN:   1. 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  Laura Li is doing well without clinical symptoms worrisome for disease recurrence. She will follow-up with Gentry Fitz NP in January 2017 and then Dr. Jana Hakim in May 2017 with history and physical examination per surveillance protocol.  A comprehensive survivorship care plan and treatment summary was reviewed with  the patient today detailing her breast cancer diagnosis, treatment course, potential late/long-term effects of treatment, appropriate follow-up care with recommendations for the future, and patient education resources.  A copy of this summary, along with a letter will be sent to the patient's primary care provider via in basket message after today's visit.  Laura Li is welcome to return to the Survivorship Clinic in the future, as needed; no follow-up will be scheduled at this time.    2. Cancer screening:  Due to Laura Li's history and her age, she should receive screening for skin cancers and colon cancer. She is S/P abdominal hysterectomy. The information and recommendations are listed on the patient's comprehensive care plan/treatment summary and were reviewed in detail with the patient.    3. Health maintenance and wellness promotion: Laura Li was encouraged to consume 5-7 servings of fruits and vegetables per day. We reviewed the "Nutrition Rainbow" handout, as well as discussed recommendations to maximize nutrition and minimize recurrence, such as increased intake of fruits, vegetables, lean proteins, and minimizing the intake of red meats and processed foods.  She was also encouraged to engage in moderate to vigorous exercise for 30 minutes per day most days of the week. We discussed the [employer] fitness program, which is  designed for cancer survivors to help them become more physically fit after cancer treatments.  She was instructed to limit her alcohol consumption and continue to abstain from tobacco use.  A copy of the "Take Control of Your Health" brochure was given to her reinforcing these recommendations.   4. Support services/counseling: It is not uncommon for this period of the patient's cancer care trajectory to be one of many emotions and stressors. We discussed an opportunity for her to participate in the next session of [facility] ("Finding Your New Normal") support group series designed for patients after they have completed treatment.  Laura Li was encouraged to take advantage of our many other support services programs, support groups, and/or counseling in coping with her new life as a cancer survivor after completing anti-cancer treatment.  She was offered support today through active listening and expressive supportive counseling.  She was given information regarding our available services and encouraged to contact me with any questions or for help enrolling in any of our support group/programs.    A total of 50 minutes of face-to-face time was spent with this patient with greater than 50% of that time in counseling and care-coordination.   Sylvan Cheese, NP  Survivorship Program [place] [phone]   Note: PRIMARY CARE PROVIDER [name], [place] [phone]

## 2015-03-14 ENCOUNTER — Other Ambulatory Visit: Payer: Self-pay | Admitting: Family Medicine

## 2015-03-14 DIAGNOSIS — I10 Essential (primary) hypertension: Secondary | ICD-10-CM

## 2015-03-14 MED ORDER — NADOLOL 20 MG PO TABS: 10.0000 mg | ORAL_TABLET | Freq: Every day | ORAL | Status: DC

## 2015-03-15 ENCOUNTER — Other Ambulatory Visit: Payer: Self-pay

## 2015-03-15 DIAGNOSIS — E039 Hypothyroidism, unspecified: Secondary | ICD-10-CM

## 2015-03-15 MED ORDER — LEVOTHYROXINE SODIUM 75 MCG PO TABS: 75.0000 ug | ORAL_TABLET | Freq: Every day | ORAL | Status: DC

## 2015-03-28 DIAGNOSIS — H2513 Age-related nuclear cataract, bilateral: Secondary | ICD-10-CM | POA: Diagnosis not present

## 2015-03-28 DIAGNOSIS — H524 Presbyopia: Secondary | ICD-10-CM | POA: Diagnosis not present

## 2015-04-06 ENCOUNTER — Encounter: Payer: Self-pay | Admitting: Nurse Practitioner

## 2015-04-06 ENCOUNTER — Other Ambulatory Visit (HOSPITAL_BASED_OUTPATIENT_CLINIC_OR_DEPARTMENT_OTHER): Payer: Medicare Other

## 2015-04-06 ENCOUNTER — Ambulatory Visit (HOSPITAL_BASED_OUTPATIENT_CLINIC_OR_DEPARTMENT_OTHER): Payer: Medicare Other | Admitting: Nurse Practitioner

## 2015-04-06 VITALS — BP 141/61 | HR 76 | Temp 97.6°F | Resp 18 | Ht 66.0 in | Wt 148.1 lb

## 2015-04-06 DIAGNOSIS — Z853 Personal history of malignant neoplasm of breast: Secondary | ICD-10-CM | POA: Diagnosis not present

## 2015-04-06 DIAGNOSIS — C50112 Malignant neoplasm of central portion of left female breast: Secondary | ICD-10-CM

## 2015-04-06 DIAGNOSIS — C50912 Malignant neoplasm of unspecified site of left female breast: Secondary | ICD-10-CM

## 2015-04-06 LAB — COMPREHENSIVE METABOLIC PANEL
ALBUMIN: 4.2 g/dL (ref 3.5–5.0)
ALK PHOS: 97 U/L (ref 40–150)
ALT: 16 U/L (ref 0–55)
AST: 22 U/L (ref 5–34)
Anion Gap: 8 mEq/L (ref 3–11)
BUN: 21.6 mg/dL (ref 7.0–26.0)
CO2: 29 mEq/L (ref 22–29)
Calcium: 10.4 mg/dL (ref 8.4–10.4)
Chloride: 104 mEq/L (ref 98–109)
Creatinine: 1.2 mg/dL — ABNORMAL HIGH (ref 0.6–1.1)
EGFR: 46 mL/min/{1.73_m2} — ABNORMAL LOW (ref >90)
GLUCOSE: 86 mg/dL (ref 70–140)
POTASSIUM: 4.7 meq/L (ref 3.5–5.1)
SODIUM: 141 meq/L (ref 136–145)
TOTAL PROTEIN: 7.9 g/dL (ref 6.4–8.3)
Total Bilirubin: 0.38 mg/dL (ref 0.20–1.20)

## 2015-04-06 LAB — CBC WITH DIFFERENTIAL/PLATELET
BASO%: 0.8 % (ref 0.0–2.0)
Basophils Absolute: 0 10*3/uL (ref 0.0–0.1)
EOS%: 2.9 % (ref 0.0–7.0)
Eosinophils Absolute: 0.2 10*3/uL (ref 0.0–0.5)
HCT: 37.5 % (ref 34.8–46.6)
HEMOGLOBIN: 12.7 g/dL (ref 11.6–15.9)
LYMPH%: 21.6 % (ref 14.0–49.7)
MCH: 30.9 pg (ref 25.1–34.0)
MCHC: 34 g/dL (ref 31.5–36.0)
MCV: 91 fL (ref 79.5–101.0)
MONO#: 0.5 10*3/uL (ref 0.1–0.9)
MONO%: 9.4 % (ref 0.0–14.0)
NEUT%: 65.3 % (ref 38.4–76.8)
NEUTROS ABS: 3.4 10*3/uL (ref 1.5–6.5)
Platelets: 199 10*3/uL (ref 145–400)
RBC: 4.12 10*6/uL (ref 3.70–5.45)
RDW: 13.5 % (ref 11.2–14.5)
WBC: 5.2 10*3/uL (ref 3.9–10.3)
lymph#: 1.1 10*3/uL (ref 0.9–3.3)

## 2015-04-06 NOTE — Progress Notes (Signed)
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 Non-of the 25 lymph nodes removed from the right axilla were involved. The tumor was estrogen and progesterone receptor positive, HER-2 not amplified. She was treated adjuvantly with doxorubicin and cyclophosphamide 4, then received 5 years of antiestrogen therapy (initially tamoxifen, later letrozole). There has been no evidence of disease recurrence on the right.  More recently, Nevin had routine left screening [facility]. This showed a suspicious area of architectural distortion in the inferior portion of the left breast. On 72/11/4707 Orma underwent left diagnostic mammography with ultrasonography. Spot compression views confirmed the presence 7 area of distortion in the lower central left breast, which was not palpable by physical exam (there was a prior biopsy scar in the upper outer quadrant of the left breast). Ultrasound confirmed a hypoechoic spiculated mass at the 6:00 location in the left breast, measuring maximally 1.3 cm. Sonography of the left axilla was negative.  Biopsy of the  left breast mass in question 120 10/29/2014 showed ([id number]) an invasive breast cancer with squamous features. It was estrogen and progesterone receptor negative. There was no HER-2 amplification, the signals ratio being 0.82 and the number per cell 1.80. The MIB-1 was 10%.  The patient has met with Dr. Donne Hazel in surgery and Dr. Pablo Ledger in radiation oncology. The patient understands there is no survival difference between lumpectomy and radiation compared with mastectomy. The patient will need sentinel lymph node sampling with either of those procedures. If she does have a lumpectomy, she will benefit from adjuvant radiation. The patient has been set up for genetics testing and this may affect her ultimate surgical decision.  Her subsequent history is as detailed below  INTERVAL HISTORY: Nare returns today for follow-up of her breast cancer, accompanied by her daughter, [name]. The interval history is generally unremarkable. Anaysha believes her energy level is improving every week. She just has not started exercising yet. She has occasional left breast pain since the end of radiation, but none today. She has occasional arthritic pains to her joints, but these are stable. Her mood is good on the venlafaxine. She is in excellent spirits.  REVIEW OF SYSTEMS: Ysabella is looking to have cataract surgery this spring. A detailed review of systems today was completely negative, except where noted above.  PAST MEDICAL HISTORY: Past Medical History  Diagnosis Date  . OA (osteoarthritis)   . History of DVT (deep vein thrombosis)   . Hyperlipidemia   . Hypertension   . Hypothyroidism   . HX: breast cancer     melanoma  . Melanoma ([place])   . Eczema   . Arthritis   . Esophageal stricture   . GERD (gastroesophageal reflux disease)   . PONV (postoperative nausea and  vomiting)   . Wears glasses   . Depression     states no depression  . Chronic kidney disease     some renal impairment from meds  .  Headache   . Anemia     from medicaions  . Heart murmur     states it is benign told 30-40 years ago  . Pneumothorax on right 06/15/2014    PAST SURGICAL HISTORY: Past Surgical History  Procedure Laterality Date  . Appendectomy  1967  . Melanoma excision  2004    right side of face  . 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 Total abdominal hysterectomy  1992    including ovaries  . Tonsillectomy and adenoidectomy  1949  . Lipoma excision  1978    right breast  . Mastectomy  1998    right-nodes out  . Breast lumpectomy      left breast-benign  . Breast surgery  1977    removal of calcified milk gland right breast  . Colonoscopy    . Radioactive seed guided mastectomy with axillary sentinel lymph node biopsy Left 05/05/2014    Procedure: RADIOACTIVE SEED GUIDED LEFT LUMPECTOMY WITH AXILLARY SENTINEL LYMPH NODE BIOPSY;  Surgeon: Emelia Loron, MD;  Location: [facility];  Service: General;  Laterality: Left;  . Chest tube insertion  06/15/2014  . Portacath placement Left 06/15/2014    Procedure: INSERTION PORT-A-CATH;  Surgeon: Emelia Loron, MD;  Location: [facility] OR;  Service: General;  Laterality: Left;  . Chest tube insertion Bilateral 06/15/2014    Procedure: CHEST TUBE INSERTION;  Surgeon: Emelia Loron, MD;  Location: [facility] OR;  Service: General;  Laterality: Bilateral;  . Port-a-cath removal N/A 10/05/2014    Procedure: REMOVAL PORT-A-CATH;  Surgeon: Emelia Loron, MD;  Location: [facility];  Service: General;  Laterality: N/A;    FAMILY HISTORY Family History  Problem Relation Age of Onset  . Heart disease Father 75    smoker  . Endometrial cancer Mother 109  . Breast cancer Cousin     x 3 cousins with breast cancer (one at 73, one at 100 and one at 22)  . Lung cancer Paternal Aunt     smoker  . Brain cancer Maternal Aunt   . Colon cancer Neg Hx   . Diabetes Maternal Aunt     ???  . Breast cancer Maternal Aunt 54  The  patient's father died at the age of 27 from a myocardial infarction. The patient's mother died at the age of 61 from metastatic endometrial cancer which had been diagnosed 2 years before. The patient had no brothers, one sister. The patient's mother's sister was diagnosed with breast cancer but the patient does not know at what age. A second maternal aunt was diagnosed with brain cancer. The patient has 3 cousins on her mother's side diagnosed with breast cancer, one of them under the age of 79. On the father's side there is a history of lung and cervical cancer  GYNECOLOGIC HISTORY:  No LMP recorded. Patient has had a hysterectomy. Menarche age 48, first live birth age 37. The patient is GX P1. She underwent hysterectomy with bilateral salpingo-oophorectomy in 1991. She used hormone replacement for approximately 7 years until 1998, when she had her right-sided breast cancer. She used oral contraceptives for approximately one year with no complications. 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 She is widowed and lives alone with no pets. Her main hobby is painting. Her daughter Faith Rogue lives in [place] and is also a homemaker. The patient has 2 grandchildren. She attends wholly [place]. The patient's daughter [name] is her healthcare power of attorney. [name] can be reached at [phone]   HEALTH MAINTENANCE: Social History  Substance Use Topics  . Smoking status: Former Smoker -- 0.75 packs/day for 3 years    Types: Cigarettes    Quit date: 03/11/1969  . Smokeless tobacco: Never Used  . Alcohol Use: 0.0 oz/week    0 Standard drinks or equivalent per week     Comment: occasional wine     Colonoscopy: 20/10/[facility]  UTM:LYYTKP post hysterectomy  Bone density:2014/osteopenia  Lipid  panel:  Allergies  Allergen Reactions  . Dyazide [Hydrochlorothiazide W-Triamterene]     Presumption: drug induced vasculitis  . Niacin Other (See Comments)    flushing  . Penicillins Diarrhea and Nausea And Vomiting    Current Outpatient Prescriptions  Medication Sig Dispense Refill  . acetaminophen (TYLENOL) 500 MG tablet Take 500 mg by mouth 2 (two) times daily as needed for mild pain.     [name] atorvastatin (LIPITOR) 20 MG tablet Take 1 tablet (20 mg total) by mouth daily. (Patient taking differently: Take 10 mg by mouth daily. ) 90 tablet 3  . Calcium-Vitamin D (CALTRATE 600 PLUS-VIT D PO) Take 1 tablet by mouth 2 (two) times daily.     [name] esomeprazole (NEXIUM) 40 MG capsule TAKE 1 BY MOUTH DAILY 90 capsule 2  . fexofenadine (ALLEGRA) 180 MG tablet Take 180 mg by mouth daily as needed for allergies.     . hyaluronate sodium (RADIAPLEXRX) GEL Apply 1 application topically 2 (two) times daily.    [name] ipratropium (ATROVENT) 0.03 % nasal spray Place 2 sprays into the nose every 12 (twelve) hours. 180 mL 4  . levothyroxine (SYNTHROID, LEVOTHROID) 75 MCG tablet Take 1 tablet (75 mcg total) by mouth daily. 90 tablet 2  . nadolol (CORGARD) 20 MG tablet Take 0.5 tablets (10 mg total) by mouth daily. 90 tablet 1  . venlafaxine XR (EFFEXOR-XR) 150 MG 24 hr capsule Take 1 capsule (150 mg total) by mouth daily. 90 capsule 3   No current facility-administered medications for this visit.    OBJECTIVE: Older white woman who appears well Filed Vitals:   04/06/15 1054  BP: 141/61  Pulse: 76  Temp: 97.6 F (36.4 C)  Resp: 18     Body mass index is 23.92 kg/(m^2).    ECOG FS:1 - Symptomatic but completely ambulatory  Skin: warm, dry  HEENT: sclerae anicteric, conjunctivae pink, oropharynx clear. No thrush or mucositis.  Lymph Nodes: No cervical or supraclavicular lymphadenopathy  Lungs: clear to auscultation bilaterally, no rales, wheezes, or rhonci  Heart: regular rate and rhythm  Abdomen:  round, soft, non tender, positive bowel sounds  Musculoskeletal: No focal spinal tenderness, no peripheral edema  Neuro: non focal, well oriented, positive affect  Breasts: right breast status post remote mastectomy. No evidence of chest wall recurrence. Right axilla benign. Left breast status post lumpectomy and radiation. No evidence of recurrent disease. Left axilla benign.   LAB RESULTS:  CMP     Component Value Date/Time   NA 141 04/06/2015 1040   NA 138 06/14/2014 1025   K 4.7 04/06/2015 1040  K 4.1 06/14/2014 1025   CL 100 06/14/2014 1025   CO2 29 04/06/2015 1040   CO2 33* 06/14/2014 1025   GLUCOSE 86 04/06/2015 1040   GLUCOSE 79 06/14/2014 1025   GLUCOSE 88 02/06/2006 1059   BUN 21.6 04/06/2015 1040   BUN 16 06/14/2014 1025   CREATININE 1.2* 04/06/2015 1040   CREATININE 0.97 06/14/2014 1025   CALCIUM 10.4 04/06/2015 1040   CALCIUM 9.9 06/14/2014 1025   PROT 7.9 04/06/2015 1040   PROT 8.5* 12/21/2013 1004   ALBUMIN 4.2 04/06/2015 1040   ALBUMIN 4.2 12/21/2013 1004   AST 22 04/06/2015 1040   AST 24 12/21/2013 1004   ALT 16 04/06/2015 1040   ALT 19 12/21/2013 1004   ALKPHOS 97 04/06/2015 1040   ALKPHOS 64 12/21/2013 1004   BILITOT 0.38 04/06/2015 1040   BILITOT 0.8 12/21/2013 1004   GFRNONAA 56* 06/14/2014 1025   GFRAA 65* 06/14/2014 1025    INo results found for: SPEP, UPEP  Lab Results  Component Value Date   WBC 5.2 04/06/2015   NEUTROABS 3.4 04/06/2015   HGB 12.7 04/06/2015   HCT 37.5 04/06/2015   MCV 91.0 04/06/2015   PLT 199 04/06/2015      Chemistry      Component Value Date/Time   NA 141 04/06/2015 1040   NA 138 06/14/2014 1025   K 4.7 04/06/2015 1040   K 4.1 06/14/2014 1025   CL 100 06/14/2014 1025   CO2 29 04/06/2015 1040   CO2 33* 06/14/2014 1025   BUN 21.6 04/06/2015 1040   BUN 16 06/14/2014 1025   CREATININE 1.2* 04/06/2015 1040   CREATININE 0.97 06/14/2014 1025      Component Value Date/Time   CALCIUM 10.4 04/06/2015 1040    CALCIUM 9.9 06/14/2014 1025   ALKPHOS 97 04/06/2015 1040   ALKPHOS 64 12/21/2013 1004   AST 22 04/06/2015 1040   AST 24 12/21/2013 1004   ALT 16 04/06/2015 1040   ALT 19 12/21/2013 1004   BILITOT 0.38 04/06/2015 1040   BILITOT 0.8 12/21/2013 1004       No results found for: LABCA2  No components found for: LABCA125  No results for input(s): INR in the last 168 hours.  Urinalysis    Component Value Date/Time   COLORURINE YELLOW 05/17/2013 1330   APPEARANCEUR CLEAR 05/17/2013 1330   LABSPEC 1.005 08/09/2014 1052   LABSPEC 1.025 05/17/2013 1330   PHURINE 7.0 08/09/2014 1052   PHURINE 5.5 05/17/2013 1330   GLUCOSEU Negative 08/09/2014 1052   GLUCOSEU NEGATIVE 05/17/2013 1330   HGBUR Negative 08/09/2014 1052   HGBUR NEGATIVE 05/17/2013 1330   BILIRUBINUR Negative 08/09/2014 1052   BILIRUBINUR n 06/21/2013 1115   BILIRUBINUR NEGATIVE 05/17/2013 1330   KETONESUR Negative 08/09/2014 1052   KETONESUR NEGATIVE 05/17/2013 1330   PROTEINUR Negative 08/09/2014 1052   PROTEINUR n 06/21/2013 1115   PROTEINUR NEGATIVE 05/17/2013 1330   UROBILINOGEN 0.2 08/09/2014 1052   UROBILINOGEN 0.2 06/21/2013 1115   UROBILINOGEN 0.2 05/17/2013 1330   NITRITE Negative 08/09/2014 1052   NITRITE n 06/21/2013 1115   NITRITE NEGATIVE 05/17/2013 1330   LEUKOCYTESUR Negative 08/09/2014 1052   LEUKOCYTESUR Trace 06/21/2013 1115    STUDIES: No results found.  ASSESSMENT: 75 y.o. [place] woman  (1) status post right modified radical mastectomy November 1998 for a pT2 pN0, stage IIA invasive ductal carcinoma, grade 2, estrogen and progesterone receptor positive, HER-2 not amplified  (a) adjuvant chemotherapy consisted of doxorubicin and cyclophosphamide 4  (  b) adjuvant anti-estrogens consisted of tamoxifen, then letrozole, for a total of 5 years  (2) status post left breast lower outer quadrant biopsy 04/07/2014 for a clinical T1c N0,stage IA  invasive ductal carcinoma, with squamous  features, estrogen and progesterone receptor negative, HER-2 not amplified, with an MIB-1 of 10%   (3) left lumpectomy and sentinel lymph node biopsy 05/05/2014 showed aT1c pN0, stage IA invasive ductal carcinoma, grade 1, triple negative, with close but negative margins.   (4) Mammaprint classifies the tumor as basal like, high risk, and predicts a 30% chance of recurrence within 10 years with local treatment only.   (5) start of treatment delayed because of bilateral pneumothoraces after port placement. Adjuvant chemotherapy fiinally started 07/11/2014 consisting of carboplatin and docetaxel given every 21 days 4, completed 09/13/2014  (6) adjuvant radiation 10/31/2014-12/15/2014:  Left breast/ 45 Gy at 1.8 Gy per fraction x 25 fractions.  Left breast boost/ 16 Gy at 2 Gy per fraction x 8 fractions  (7) genetics testing March 2016 showed no deleterious mutation in the [employer] panel  [[facility]] including sequencing and rearrangement analysis for the following 24 genes:ATM, BARD1, BRCA1, BRCA2, BRIP1, CDH1, CHEK2, EPCAM, MLH1, MRE11A, MSH2, MSH6, MUTYH, NBN, NF1, PALB2, PMS2, PTEN, RAD50, RAD51C, RAD51D, SMARCA4, STK11, and TP53.   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    PLAN: Charnita is "blossoming" at this point. She is giving great thought to starting a walking program at [facility] where the ground is flat and paved. This will go a long way to help her recover her energy. The labs were reviewed in detail and were stable.   She is due for her first mammogram since surgery, so I have placed those orders today. She is anxious about the pain from the pressure of the procedure, but agrees to have this done on time.   Sharah is already scheduled for a follow up visit with Dr. Jana Hakim in May. She understands and agrees with this plan. She has been encouraged to call with any issues that might arise before her next  visit here.  Laurie Panda, NP   04/06/2015 11:24 AM

## 2015-04-09 ENCOUNTER — Other Ambulatory Visit: Payer: Self-pay | Admitting: Oncology

## 2015-04-14 ENCOUNTER — Ambulatory Visit
Admission: RE | Admit: 2015-04-14 | Discharge: 2015-04-14 | Disposition: A | Discharge status: Home / Self Care | Payer: Medicare Other | Source: Ambulatory Visit | Attending: Nurse Practitioner | Admitting: Nurse Practitioner

## 2015-04-14 DIAGNOSIS — C50112 Malignant neoplasm of central portion of left female breast: Secondary | ICD-10-CM

## 2015-04-14 DIAGNOSIS — R922 Inconclusive mammogram: Secondary | ICD-10-CM | POA: Diagnosis not present

## 2015-04-14 LAB — HM MAMMOGRAPHY: HM Mammogram: NORMAL

## 2015-04-17 ENCOUNTER — Encounter: Payer: Self-pay | Admitting: Family Medicine

## 2015-05-11 DIAGNOSIS — L218 Other seborrheic dermatitis: Secondary | ICD-10-CM | POA: Diagnosis not present

## 2015-05-11 DIAGNOSIS — L821 Other seborrheic keratosis: Secondary | ICD-10-CM | POA: Diagnosis not present

## 2015-05-11 DIAGNOSIS — M793 Panniculitis, unspecified: Secondary | ICD-10-CM | POA: Diagnosis not present

## 2015-05-11 DIAGNOSIS — D2371 Other benign neoplasm of skin of right lower limb, including hip: Secondary | ICD-10-CM | POA: Diagnosis not present

## 2015-05-11 DIAGNOSIS — D1801 Hemangioma of skin and subcutaneous tissue: Secondary | ICD-10-CM | POA: Diagnosis not present

## 2015-05-11 DIAGNOSIS — Z85820 Personal history of malignant melanoma of skin: Secondary | ICD-10-CM | POA: Diagnosis not present

## 2015-05-25 DIAGNOSIS — H524 Presbyopia: Secondary | ICD-10-CM | POA: Diagnosis not present

## 2015-05-25 DIAGNOSIS — H25813 Combined forms of age-related cataract, bilateral: Secondary | ICD-10-CM | POA: Diagnosis not present

## 2015-06-01 ENCOUNTER — Ambulatory Visit (INDEPENDENT_AMBULATORY_CARE_PROVIDER_SITE_OTHER)
Admission: RE | Admit: 2015-06-01 | Discharge: 2015-06-01 | Disposition: A | Discharge status: Home / Self Care | Payer: Medicare Other | Source: Ambulatory Visit | Attending: Family Medicine | Admitting: Family Medicine

## 2015-06-01 ENCOUNTER — Ambulatory Visit (INDEPENDENT_AMBULATORY_CARE_PROVIDER_SITE_OTHER): Payer: Medicare Other | Admitting: Family Medicine

## 2015-06-01 ENCOUNTER — Other Ambulatory Visit: Payer: Self-pay | Admitting: Oncology

## 2015-06-01 ENCOUNTER — Encounter: Payer: Self-pay | Admitting: Family Medicine

## 2015-06-01 VITALS — BP 132/84 | HR 68 | Temp 97.7°F | Resp 18 | Ht 66.0 in | Wt 149.0 lb

## 2015-06-01 DIAGNOSIS — E785 Hyperlipidemia, unspecified: Secondary | ICD-10-CM

## 2015-06-01 DIAGNOSIS — E038 Other specified hypothyroidism: Secondary | ICD-10-CM | POA: Diagnosis not present

## 2015-06-01 DIAGNOSIS — N183 Chronic kidney disease, stage 3 unspecified: Secondary | ICD-10-CM

## 2015-06-01 DIAGNOSIS — E034 Atrophy of thyroid (acquired): Secondary | ICD-10-CM

## 2015-06-01 DIAGNOSIS — I10 Essential (primary) hypertension: Secondary | ICD-10-CM

## 2015-06-01 DIAGNOSIS — M79672 Pain in left foot: Secondary | ICD-10-CM

## 2015-06-01 DIAGNOSIS — I82541 Chronic embolism and thrombosis of right tibial vein: Secondary | ICD-10-CM

## 2015-06-01 DIAGNOSIS — S92512A Displaced fracture of proximal phalanx of left lesser toe(s), initial encounter for closed fracture: Secondary | ICD-10-CM | POA: Diagnosis not present

## 2015-06-01 DIAGNOSIS — R2241 Localized swelling, mass and lump, right lower limb: Secondary | ICD-10-CM

## 2015-06-01 LAB — COMPREHENSIVE METABOLIC PANEL
ALBUMIN: 4.7 g/dL (ref 3.5–5.2)
ALK PHOS: 78 U/L (ref 39–117)
ALT: 16 U/L (ref 0–35)
AST: 22 U/L (ref 0–37)
BILIRUBIN TOTAL: 0.4 mg/dL (ref 0.2–1.2)
BUN: 23 mg/dL (ref 6–23)
CO2: 30 mEq/L (ref 19–32)
Calcium: 10.3 mg/dL (ref 8.4–10.5)
Chloride: 102 mEq/L (ref 96–112)
Creatinine, Ser: 1.01 mg/dL (ref 0.40–1.20)
GFR: 56.56 mL/min — ABNORMAL LOW (ref >60.00)
Glucose, Bld: 78 mg/dL (ref 70–99)
POTASSIUM: 4.1 meq/L (ref 3.5–5.1)
Sodium: 141 mEq/L (ref 135–145)
TOTAL PROTEIN: 8.2 g/dL (ref 6.0–8.3)

## 2015-06-01 LAB — LDL CHOLESTEROL, DIRECT: Direct LDL: 112 mg/dL

## 2015-06-01 LAB — CBC
HCT: 37.8 % (ref 36.0–46.0)
HEMOGLOBIN: 13.1 g/dL (ref 12.0–15.0)
MCHC: 34.7 g/dL (ref 30.0–36.0)
MCV: 90 fl (ref 78.0–100.0)
PLATELETS: 218 10*3/uL (ref 150.0–400.0)
RBC: 4.2 Mil/uL (ref 3.87–5.11)
RDW: 13.4 % (ref 11.5–15.5)
WBC: 5.5 10*3/uL (ref 4.0–10.5)

## 2015-06-01 MED ORDER — RIVAROXABAN 20 MG PO TABS: 20.0000 mg | ORAL_TABLET | Freq: Every day | ORAL | Status: DC

## 2015-06-01 NOTE — Assessment & Plan Note (Signed)
S: well controlled in past on 75 mcg levothyroxine Lab Results  Component Value Date   TSH 1.45 12/21/2013  A/P: check TSH today

## 2015-06-01 NOTE — Assessment & Plan Note (Addendum)
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 Now, Patient 6 months later continues to have pain at same site. I am concerned this represents chronic dvt in patient with history of prior DVT in 1990s.  A/P: will start on xarelto 20mg  daily. Follow up3 months. Refer to hematology for their opinion. This could be in distribution of anterior tibial artery as noted on duplex (but stated origin so I am not certain).

## 2015-06-01 NOTE — Assessment & Plan Note (Signed)
S: controlled on nadolol 1/2 tablet of 20mg  daily. HR may tolerate full dose BP Readings from Last 3 Encounters:  06/01/15 132/84  04/06/15 141/61  02/14/15 134/46  A/P:Continue current medications

## 2015-06-01 NOTE — Patient Instructions (Signed)
Check labs before you leave  Go to our [place] location for x-ray. May need a walking boot. Tylenol or icing for the pain.   Start xarelto 20mg  for chronic DVT. We will call you within a week about your referral to hematology. If you do not hear within 2 weeks, give [place] a call.  May take a few months to get in with them.

## 2015-06-01 NOTE — Assessment & Plan Note (Signed)
S: GFR has been in 45-55 range. Encouraged avoid nsaids A/P: will check today. Consider titrating down on nexium to 20mg  from 40mg  if any worsening. Also consider h2 blocker

## 2015-06-01 NOTE — Progress Notes (Signed)
[name], MD  Subjective:  Laura Li is a 75 y.o. year old very pleasant female patient who presents for/with See problem oriented charting ROS- has left foot and right leg pain above ankle. No chest pain or shortness of breath. No headache or blurry vision. Does have some fatigue and imbalance at times.   Past Medical History-  Patient Active Problem List   Diagnosis Date Noted  . Localized swelling, mass, or lump of lower extremity 11/05/2014    Priority: High  . Pneumothorax 06/15/2014    Priority: High  . Breast cancer, left breast ([place]) 04/24/2014    Priority: High  . Vasculitis ([place]) 11/09/2012    Priority: High  . Melanoma of skin ([place]) 03/31/2014    Priority: Medium  . Chronic kidney disease, stage III (moderate) 12/09/2012    Priority: Medium  . 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    Priority: Medium  . DVT, HX OF 12/30/2006    Priority: Medium  . Hypothyroidism 02/06/2006    Priority: Medium  . Hyperlipidemia 02/06/2006    Priority: Medium  . Essential hypertension 02/06/2006    Priority: Medium  . GERD (gastroesophageal reflux disease) 03/31/2014    Priority: Low  . Hot flashes 03/31/2014    Priority: Low  . Osteoarthritis 03/31/2014    Priority: Low  . Allergic rhinitis 02/06/2006    Priority: Low  . Osteopenia 02/06/2006    Priority: Low  . Excessive tearing 09/21/2014  . Antineoplastic chemotherapy induced anemia 08/17/2014  . Diverticulitis 08/09/2014  . Bilateral breast cancer ([place]) 04/29/2014  . Family history of breast cancer 04/29/2014  . Breast cancer, right breast ([place]) 04/24/2014    Medications- reviewed and updated Current Outpatient Prescriptions  Medication Sig Dispense Refill  . acetaminophen (TYLENOL) 500 MG tablet Take 500 mg by mouth 2 (two) times daily as needed for mild pain.     . Artificial Tear Ointment (REFRESH LACRI-LUBE OP) Apply 2 drops to eye at bedtime as needed.    [name] atorvastatin (LIPITOR) 20 MG tablet Take  1 tablet (20 mg total) by mouth daily. (Patient taking differently: Take 10 mg by mouth daily. ) 90 tablet 3  . B Complex-C-Folic Acid (STRESS FORMULA PO) Take 1 tablet by mouth.    . Calcium-Vitamin D (CALTRATE 600 PLUS-VIT D PO) Take 1 tablet by mouth 2 (two) times daily.     [name] doxycycline (VIBRAMYCIN) 100 MG capsule Take 100 mg by mouth daily.    [name] esomeprazole (NEXIUM) 40 MG capsule TAKE 1 BY MOUTH DAILY 90 capsule 2  . fexofenadine (ALLEGRA) 180 MG tablet Take 180 mg by mouth daily as needed for allergies.     [name] ipratropium (ATROVENT) 0.03 % nasal spray Place 2 sprays into the nose every 12 (twelve) hours. 180 mL 4  . Lactobacillus-Inulin (CULTURELLE DIGESTIVE HEALTH PO) Take 1 capsule by mouth daily.    [name] levothyroxine (SYNTHROID, LEVOTHROID) 75 MCG tablet Take 1 tablet (75 mcg total) by mouth daily. 90 tablet 2  . nadolol (CORGARD) 20 MG tablet Take 0.5 tablets (10 mg total) by mouth daily. 90 tablet 1  . Propylene Glycol 0.6 % SOLN Apply 2 drops to eye daily as needed.    . venlafaxine XR (EFFEXOR-XR) 150 MG 24 hr capsule Take 1 capsule (150 mg total) by mouth daily. 90 capsule 3  . rivaroxaban (XARELTO) 20 MG TABS tablet Take 1 tablet (20 mg total) by mouth daily with supper. 30 tablet 11   No current facility-administered  medications for this visit.    Objective: BP 132/84 mmHg  Pulse 68  Temp(Src) 97.7 F (36.5 C) (Oral)  Resp 18  Ht 5\' 6"  (1.676 m)  Wt 149 lb (67.586 kg)  BMI 24.06 kg/m2  SpO2 97% Gen: NAD, resting comfortably CV: RRR no murmurs rubs or gallops Lungs: CTAB no crackles, wheeze, rhonchi Abdomen: soft/nontender/nondistended/normal bowel sounds. No rebound or guarding.  Ext: no edema, about 2 cm area in right lower leg above medial ankle firm and tender area- not erythematous or warm to touch  Left foot 4th ray with pain along palpation of distal portion and at MTP joint.   Skin: warm, dry, no rash Neuro: grossly normal, moves all  extremities  Assessment/Plan:  Essential hypertension S: controlled on nadolol 1/2 tablet of 20mg  daily. HR may tolerate full dose BP Readings from Last 3 Encounters:  06/01/15 132/84  04/06/15 141/61  02/14/15 134/46  A/P:Continue current medications  Chronic kidney disease, stage III (moderate) S: GFR has been in 45-55 range. Encouraged avoid nsaids A/P: will check today. Consider titrating down on nexium to 20mg  from 40mg  if any worsening. 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 Now, Patient 6 months later continues to have pain at same site. I am concerned this represents chronic dvt in patient with history of prior DVT in 1990s.  A/P: will start on xarelto 20mg  daily. Follow up3 months. Refer to hematology for their opinion. This could be in distribution of anterior tibial artery as noted on duplex (but stated origin so I am not certain).   Left foot pain S: started a week ago when standing up after painting on ground. Felt immediate pain in 4th toe ray. Pain is worsening 4-5/10. Makes it hard to walk on A/P: will get an x-ray- possible 4th ray fracture- would use walking boot if found. If negative, ice and tylenol for another week or two- could refer to podiatry if does not resolve  3 months verbal. Return precautions advised.   Orders Placed This Encounter  Procedures  . DG Foot Complete Left    Standing Status: Future     Number of Occurrences: 1     Standing Expiration Date: 07/31/2016    Order Specific Question:  Reason for Exam (SYMPTOM  OR DIAGNOSIS REQUIRED)    Answer:  left foot pain- 4th ray    Order Specific Question:  Preferred imaging location?    Answer:  [facility]  . CBC    [place]  .  Comprehensive metabolic panel    [place]  . TSH    [place]  . LDL cholesterol, direct    [place]  . Ambulatory referral to Hematology    Referral Priority:  Routine    Referral Type:  Consultation    Referral Reason:  Specialty Services Required    Requested Specialty:  Oncology    Number of Visits Requested:  1    Meds ordered this encounter  Medications  . rivaroxaban (XARELTO) 20 MG TABS tablet    Sig: Take 1 tablet (20 mg total) by mouth daily with supper.    Dispense:  30 tablet    Refill:  11

## 2015-06-02 ENCOUNTER — Encounter: Payer: Self-pay | Admitting: Family Medicine

## 2015-06-02 LAB — TSH: TSH: 3.13 u[IU]/mL (ref 0.35–4.50)

## 2015-06-05 ENCOUNTER — Other Ambulatory Visit: Payer: Self-pay | Admitting: Family Medicine

## 2015-06-05 DIAGNOSIS — H2512 Age-related nuclear cataract, left eye: Secondary | ICD-10-CM | POA: Diagnosis not present

## 2015-06-05 DIAGNOSIS — S92912B Unspecified fracture of left toe(s), initial encounter for open fracture: Secondary | ICD-10-CM

## 2015-06-08 ENCOUNTER — Telehealth: Payer: Self-pay | Admitting: Oncology

## 2015-06-08 NOTE — Telephone Encounter (Signed)
sch appt per staff message. Left vm for patient to infrom her of appt date/time

## 2015-06-12 DIAGNOSIS — H2512 Age-related nuclear cataract, left eye: Secondary | ICD-10-CM | POA: Diagnosis not present

## 2015-06-12 DIAGNOSIS — H25812 Combined forms of age-related cataract, left eye: Secondary | ICD-10-CM | POA: Diagnosis not present

## 2015-06-14 ENCOUNTER — Other Ambulatory Visit: Payer: Self-pay | Admitting: Oncology

## 2015-06-15 ENCOUNTER — Ambulatory Visit (INDEPENDENT_AMBULATORY_CARE_PROVIDER_SITE_OTHER)
Admission: RE | Admit: 2015-06-15 | Discharge: 2015-06-15 | Disposition: A | Discharge status: Home / Self Care | Payer: Medicare Other | Source: Ambulatory Visit | Attending: Family Medicine | Admitting: Family Medicine

## 2015-06-15 DIAGNOSIS — S92512A Displaced fracture of proximal phalanx of left lesser toe(s), initial encounter for closed fracture: Secondary | ICD-10-CM | POA: Diagnosis not present

## 2015-06-15 DIAGNOSIS — S92912B Unspecified fracture of left toe(s), initial encounter for open fracture: Secondary | ICD-10-CM

## 2015-06-16 ENCOUNTER — Ambulatory Visit (INDEPENDENT_AMBULATORY_CARE_PROVIDER_SITE_OTHER): Payer: Medicare Other | Admitting: Family Medicine

## 2015-06-16 ENCOUNTER — Encounter: Payer: Self-pay | Admitting: Family Medicine

## 2015-06-16 VITALS — BP 120/70 | Temp 97.5°F | Wt 147.0 lb

## 2015-06-16 DIAGNOSIS — F418 Other specified anxiety disorders: Secondary | ICD-10-CM

## 2015-06-16 DIAGNOSIS — M8580 Other specified disorders of bone density and structure, unspecified site: Secondary | ICD-10-CM

## 2015-06-16 DIAGNOSIS — Z780 Asymptomatic menopausal state: Secondary | ICD-10-CM | POA: Diagnosis not present

## 2015-06-16 DIAGNOSIS — E039 Hypothyroidism, unspecified: Secondary | ICD-10-CM

## 2015-06-16 DIAGNOSIS — S92502A Displaced unspecified fracture of left lesser toe(s), initial encounter for closed fracture: Secondary | ICD-10-CM | POA: Diagnosis not present

## 2015-06-16 DIAGNOSIS — I10 Essential (primary) hypertension: Secondary | ICD-10-CM

## 2015-06-16 DIAGNOSIS — J309 Allergic rhinitis, unspecified: Secondary | ICD-10-CM

## 2015-06-16 MED ORDER — NADOLOL 20 MG PO TABS: 10.0000 mg | ORAL_TABLET | Freq: Every day | ORAL | Status: DC

## 2015-06-16 MED ORDER — LEVOTHYROXINE SODIUM 75 MCG PO TABS: 75.0000 ug | ORAL_TABLET | Freq: Every day | ORAL | Status: DC

## 2015-06-16 MED ORDER — VENLAFAXINE HCL ER 150 MG PO CP24: 150.0000 mg | ORAL_CAPSULE | Freq: Every day | ORAL | Status: DC

## 2015-06-16 MED ORDER — IPRATROPIUM BROMIDE 0.03 % NA SOLN: 2.0000 | Freq: Two times a day (BID) | NASAL | Status: DC

## 2015-06-16 MED ORDER — ATORVASTATIN CALCIUM 20 MG PO TABS: 20.0000 mg | ORAL_TABLET | Freq: Every day | ORAL | Status: DC

## 2015-06-16 NOTE — Progress Notes (Signed)
Laura Reddish, MD  Subjective:  Laura Li is a 75 y.o. year old very pleasant female patient who presents for/with See problem oriented charting ROS- mild erythema and swelling around 4th and 5th toes, no fever, chills, vomiting. No worsening pain.   Past Medical History-  Patient Active Problem List   Diagnosis Date Noted  . Localized swelling, mass, or lump of lower extremity 11/05/2014    Priority: High  . Pneumothorax 06/15/2014    Priority: High  . Breast cancer, left breast ([place]) 04/24/2014    Priority: High  . Vasculitis ([place]) 11/09/2012    Priority: High  . Melanoma of skin ([place]) 03/31/2014    Priority: Medium  . Chronic kidney disease, stage III (moderate) 12/09/2012    Priority: Medium  . 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    Priority: Medium  . DVT, HX OF 12/30/2006    Priority: Medium  . Hypothyroidism 02/06/2006    Priority: Medium  . Hyperlipidemia 02/06/2006    Priority: Medium  . Essential hypertension 02/06/2006    Priority: Medium  . GERD (gastroesophageal reflux disease) 03/31/2014    Priority: Low  . Hot flashes 03/31/2014    Priority: Low  . Osteoarthritis 03/31/2014    Priority: Low  . Allergic rhinitis 02/06/2006    Priority: Low  . Osteopenia 02/06/2006    Priority: Low  . Excessive tearing 09/21/2014  . Antineoplastic chemotherapy induced anemia 08/17/2014  . Diverticulitis 08/09/2014  . Bilateral breast cancer ([place]) 04/29/2014  . Family history of breast cancer 04/29/2014  . Breast cancer, right breast ([place]) 04/24/2014    Medications- reviewed and updated Current Outpatient Prescriptions  Medication Sig Dispense Refill  . atorvastatin (LIPITOR) 20 MG tablet Take 1 tablet (20 mg total) by mouth daily. 90 tablet 3  . B Complex-C-Folic Acid (STRESS FORMULA PO) Take 1 tablet by mouth.    . Calcium-Vitamin D (CALTRATE 600 PLUS-VIT D PO) Take 1 tablet by mouth 2 (two) times daily.     [name] doxycycline (VIBRAMYCIN) 100 MG capsule Take  100 mg by mouth daily.    [name] esomeprazole (NEXIUM) 40 MG capsule TAKE 1 BY MOUTH DAILY 90 capsule 2  . fexofenadine (ALLEGRA) 180 MG tablet Take 180 mg by mouth daily as needed for allergies.     [name] ipratropium (ATROVENT) 0.03 % nasal spray Place 2 sprays into the nose every 12 (twelve) hours. 180 mL 4  . Lactobacillus-Inulin (CULTURELLE DIGESTIVE HEALTH PO) Take 1 capsule by mouth daily.    [name] levothyroxine (SYNTHROID, LEVOTHROID) 75 MCG tablet Take 1 tablet (75 mcg total) by mouth daily. 90 tablet 2  . nadolol (CORGARD) 20 MG tablet Take 0.5 tablets (10 mg total) by mouth daily. 90 tablet 1  . Propylene Glycol 0.6 % SOLN Apply 2 drops to eye daily as needed.    . rivaroxaban (XARELTO) 20 MG TABS tablet Take 1 tablet (20 mg total) by mouth daily with supper. 30 tablet 11  . venlafaxine XR (EFFEXOR-XR) 150 MG 24 hr capsule Take 1 capsule (150 mg total) by mouth daily. 90 capsule 3  . acetaminophen (TYLENOL) 500 MG tablet Take 500 mg by mouth 2 (two) times daily as needed for mild pain. Reported on 06/16/2015    . Artificial Tear Ointment (REFRESH LACRI-LUBE OP) Apply 2 drops to eye at bedtime as needed. Reported on 06/16/2015     No current facility-administered medications for this visit.     Objective: BP 120/70 mmHg  Temp(Src) 97.5 F (36.4 C)  Wt 147 lb (66.679 kg) Gen: NAD, resting comfortably  Ext: no edema, about 2 cm area in right lower leg above medial ankle firm and tender area- not erythematous or warm to touch   Left foot 4th toe with pain along proximal phalanx only. Previously had some pain onto metatarsal and at MTP joint but this has improved. Very slight erythema on foot. She is wearing post op shoe- non antalgic gait  Skin: warm, dry Neuro: grossly normal, moves all extremities, intact distal sensation  Dg Foot Complete Left  06/15/2015  CLINICAL DATA:  Follow-up fracture . EXAM: LEFT FOOT - COMPLETE 3+ VIEW COMPARISON:  06/01/2015. FINDINGS: Fracture of the proximal phalanx  of the left fourth digit noted. This is unchanged in appearance. No radiopaque foreign body. No other focal abnormality . IMPRESSION: Fracture of the proximal phalanx of the left fourth digit. No change in appearance. Electronically Signed   By: [place]   On: 06/15/2015 16:15   Dg Foot Complete Left  06/01/2015  CLINICAL DATA:  Injury.  Pain. EXAM: LEFT FOOT - COMPLETE 3+ VIEW COMPARISON:  None. FINDINGS: Oblique slightly displaced fracture noted the proximal phalanx of the left fourth digit. No other focal abnormality identified. IMPRESSION: Oblique slightly displaced fracture of the proximal phalanx of left fourth digit. Electronically Signed   By: Marcello Moores  Register   On: 06/01/2015 15:50     Assessment/Plan:  Fracture of fourth toe, left, closed, initial encounter osteopenia S: she initially felt better in the shoe and with buddy taping but noted shoe was too large and when switched to smaller shoe seemed to slightly worsen. Compliant with buddy taping. Started around 3/17 so week 3 at this point. Has not had recent osteopenia testing- last in 2010. She had been on fosamax for 5 years several years ago A/P: will repeat osteopenia testing- strongly suspect will need fosamax again. Did follow up x-ray and no worsening displacement which is reassuring. Pain is better on exam though not reported by patient without palpation. Looking at about 6 weeks to heal- repeat eam in 3 weeks. Consider repeat films vs. Referral to orthopedics if pain has not improved by that point. Continue calcium/vitamin D. No ibuprofen or nsaids.   3 weeks.  Return precautions advised.   Orders Placed This Encounter  Procedures  . DG Bone Density    Standing Status: Future     Number of Occurrences:      Standing Expiration Date: 08/15/2016    Order Specific Question:  Reason for Exam (SYMPTOM  OR DIAGNOSIS REQUIRED)    Answer:  follow up osteopenia.    Order Specific Question:  Preferred imaging location?     Answer:  [facility]    Meds ordered this encounter  Medications  . ipratropium (ATROVENT) 0.03 % nasal spray    Sig: Place 2 sprays into the nose every 12 (twelve) hours.    Dispense:  180 mL    Refill:  4  . venlafaxine XR (EFFEXOR-XR) 150 MG 24 hr capsule    Sig: Take 1 capsule (150 mg total) by mouth daily.    Dispense:  90 capsule    Refill:  3  . nadolol (CORGARD) 20 MG tablet    Sig: Take 0.5 tablets (10 mg total) by mouth daily.    Dispense:  90 tablet    Refill:  1  . levothyroxine (SYNTHROID, LEVOTHROID) 75 MCG tablet    Sig: Take 1 tablet (75 mcg total) by mouth daily.  Dispense:  90 tablet    Refill:  2  . atorvastatin (LIPITOR) 20 MG tablet    Sig: Take 1 tablet (20 mg total) by mouth daily.    Dispense:  90 tablet    Refill:  3

## 2015-06-16 NOTE — Patient Instructions (Addendum)
Return in 3 weeks for repeat exam. Hopefully with the right shoe this slowly gets better but on my exam it only hurt over the fracture this time and less so than previously so I think you are going in the right direction. Suspect 6 weeks to heal.   Schedule your bone density test at check out desk. I suspect we will need to restart fosamax

## 2015-06-19 ENCOUNTER — Encounter: Payer: Self-pay | Admitting: Family Medicine

## 2015-06-19 ENCOUNTER — Other Ambulatory Visit: Payer: Self-pay

## 2015-06-21 DIAGNOSIS — H2511 Age-related nuclear cataract, right eye: Secondary | ICD-10-CM | POA: Diagnosis not present

## 2015-06-26 ENCOUNTER — Ambulatory Visit (HOSPITAL_BASED_OUTPATIENT_CLINIC_OR_DEPARTMENT_OTHER): Payer: Medicare Other | Admitting: Oncology

## 2015-06-26 ENCOUNTER — Telehealth: Payer: Self-pay | Admitting: Oncology

## 2015-06-26 ENCOUNTER — Other Ambulatory Visit (HOSPITAL_BASED_OUTPATIENT_CLINIC_OR_DEPARTMENT_OTHER): Payer: Medicare Other

## 2015-06-26 VITALS — BP 132/64 | HR 72 | Temp 97.6°F | Resp 18 | Ht 66.0 in | Wt 147.5 lb

## 2015-06-26 DIAGNOSIS — C50512 Malignant neoplasm of lower-outer quadrant of left female breast: Secondary | ICD-10-CM | POA: Diagnosis not present

## 2015-06-26 DIAGNOSIS — C50912 Malignant neoplasm of unspecified site of left female breast: Secondary | ICD-10-CM

## 2015-06-26 LAB — CBC WITH DIFFERENTIAL/PLATELET
BASO%: 0.8 % (ref 0.0–2.0)
BASOS ABS: 0 10*3/uL (ref 0.0–0.1)
EOS ABS: 0.1 10*3/uL (ref 0.0–0.5)
EOS%: 2.6 % (ref 0.0–7.0)
HEMATOCRIT: 36.6 % (ref 34.8–46.6)
HEMOGLOBIN: 12.6 g/dL (ref 11.6–15.9)
LYMPH#: 1.2 10*3/uL (ref 0.9–3.3)
LYMPH%: 23 % (ref 14.0–49.7)
MCH: 31.1 pg (ref 25.1–34.0)
MCHC: 34.4 g/dL (ref 31.5–36.0)
MCV: 90.6 fL (ref 79.5–101.0)
MONO#: 0.4 10*3/uL (ref 0.1–0.9)
MONO%: 7.8 % (ref 0.0–14.0)
NEUT#: 3.4 10*3/uL (ref 1.5–6.5)
NEUT%: 65.8 % (ref 38.4–76.8)
PLATELETS: 198 10*3/uL (ref 145–400)
RBC: 4.04 10*6/uL (ref 3.70–5.45)
RDW: 13.4 % (ref 11.2–14.5)
WBC: 5.2 10*3/uL (ref 3.9–10.3)

## 2015-06-26 LAB — COMPREHENSIVE METABOLIC PANEL
ALBUMIN: 4.3 g/dL (ref 3.5–5.0)
ALK PHOS: 79 U/L (ref 40–150)
ALT: 16 U/L (ref 0–55)
ANION GAP: 9 meq/L (ref 3–11)
AST: 22 U/L (ref 5–34)
BUN: 21.2 mg/dL (ref 7.0–26.0)
CALCIUM: 10.4 mg/dL (ref 8.4–10.4)
CHLORIDE: 105 meq/L (ref 98–109)
CO2: 27 mEq/L (ref 22–29)
Creatinine: 1.1 mg/dL (ref 0.6–1.1)
EGFR: 49 mL/min/{1.73_m2} — AB (ref >90)
Glucose: 97 mg/dl (ref 70–140)
POTASSIUM: 4.5 meq/L (ref 3.5–5.1)
Sodium: 141 mEq/L (ref 136–145)
Total Bilirubin: 0.49 mg/dL (ref 0.20–1.20)
Total Protein: 7.8 g/dL (ref 6.4–8.3)

## 2015-06-26 NOTE — Progress Notes (Signed)
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 Non-of the 25 lymph nodes removed from the right axilla were involved. The tumor was estrogen and progesterone receptor positive, HER-2 not amplified. She was treated adjuvantly with doxorubicin and cyclophosphamide 4, then received 5 years of antiestrogen therapy (initially tamoxifen, later letrozole). There has been no evidence of disease recurrence on the right.  More recently, Laura Li had routine left screening [facility]. This showed a suspicious area of architectural distortion in the inferior portion of the left breast. On 96/22/2979 [name] underwent left diagnostic mammography with ultrasonography. Spot compression views confirmed the presence 7 area of distortion in the lower central left breast, which was not palpable by physical exam (there was a prior biopsy scar in the upper outer quadrant of the left breast). Ultrasound confirmed a hypoechoic spiculated mass at the 6:00 location in the left breast, measuring maximally 1.3 cm. Sonography of the left axilla was negative.  Biopsy of the  left breast mass in question 120 10/29/2014 showed ([id number]) an invasive breast cancer with squamous features. It was estrogen and progesterone receptor negative. There was no HER-2 amplification, the signals ratio being 0.82 and the number per cell 1.80. The MIB-1 was 10%.  The patient has met with Dr. Donne Hazel in surgery and Dr. Pablo Ledger in radiation oncology. The patient understands there is no survival difference between lumpectomy and radiation compared with mastectomy. The patient will need sentinel lymph node sampling with either of those procedures. If she does have a lumpectomy, she will benefit from adjuvant radiation. The patient has been set up for genetics testing and this may affect her ultimate surgical decision.  Her subsequent history is as detailed below  INTERVAL HISTORY: Laura Li returns today for follow-up of her triple negative breast cancer accompanied by her daughter. The interval history is significant for her having twisted her left foot and broken her fourth left foot to go. She is wearing a boot regarding. This is keeping her from exercising, she says. Also she had left cataract surgery, which was very successful.  REVIEW OF SYSTEMS: A detailed review of systems today was otherwise stable  PAST MEDICAL HISTORY: Past Medical History  Diagnosis Date  . OA (osteoarthritis)   . History of DVT (deep vein thrombosis)   . Hyperlipidemia   . Hypertension   . Hypothyroidism   . HX: breast cancer     melanoma  . Melanoma ([place])   . Eczema   . Arthritis   . Esophageal stricture   . GERD (gastroesophageal reflux disease)   . PONV (postoperative nausea and vomiting)   . Wears glasses   . Depression     states no depression  . Chronic kidney disease     some renal  impairment from meds  . Headache   . Anemia     from medicaions  . Heart murmur     states it is benign told 30-40 years ago  . Pneumothorax on right 06/15/2014    PAST SURGICAL HISTORY: Past  Surgical History  Procedure Laterality Date  . Appendectomy  1967  . Melanoma excision  2004    right side of face  . 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 Total abdominal hysterectomy  1992    including ovaries  . Tonsillectomy and adenoidectomy  1949  . Lipoma excision  1978    right breast  . Mastectomy  1998    right-nodes out  . Breast lumpectomy      left breast-benign  . Breast surgery  1977    removal of calcified milk gland right breast  . Colonoscopy    . Radioactive seed guided mastectomy with axillary sentinel lymph node biopsy Left 05/05/2014    Procedure: RADIOACTIVE SEED GUIDED LEFT LUMPECTOMY WITH AXILLARY SENTINEL LYMPH NODE BIOPSY;  Surgeon: Rolm Bookbinder, MD;  Location: [facility];  Service: General;  Laterality: Left;  . Chest tube insertion  06/15/2014  . Portacath placement Left 06/15/2014    Procedure: INSERTION PORT-A-CATH;  Surgeon: Rolm Bookbinder, MD;  Location: [facility];  Service: General;  Laterality: Left;  . Chest tube insertion Bilateral 06/15/2014    Procedure: CHEST TUBE INSERTION;  Surgeon: Rolm Bookbinder, MD;  Location: [facility];  Service: General;  Laterality: Bilateral;  . Port-a-cath removal N/A 10/05/2014    Procedure: REMOVAL PORT-A-CATH;  Surgeon: Rolm Bookbinder, MD;  Location: [facility];  Service: General;  Laterality: N/A;    FAMILY HISTORY Family History  Problem Relation Age of Onset  . Heart disease Father 51    smoker  . Endometrial cancer Mother 35  . Breast cancer Cousin     x 3 cousins with breast cancer (one at 55, one at 56 and one at 14)  . Lung cancer Paternal Aunt     smoker  . Brain cancer Maternal Aunt   . Colon cancer Neg Hx   . Diabetes Maternal Aunt     ???  . Breast cancer Maternal Aunt 51  The patient's father died at the age of 64 from a myocardial infarction. The patient's mother died at the age of 71 from metastatic endometrial cancer which had been diagnosed 2 years  before. The patient had no brothers, one sister. The patient's mother's sister was diagnosed with breast cancer but the patient does not know at what age. A second maternal aunt was diagnosed with brain cancer. The patient has 3 cousins on her mother's side diagnosed with breast cancer, one of them under the age of 37. On the father's side there is a history of lung and cervical cancer  GYNECOLOGIC HISTORY:  No LMP recorded. Patient has had a hysterectomy. Menarche age 13, first live birth age 31. The patient is GX P1. She underwent hysterectomy with bilateral salpingo-oophorectomy in 1991. She used hormone replacement for approximately 7 years until 1998, when she had her right-sided breast cancer. She used oral contraceptives for approximately one year with no complications. 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 She is widowed and lives alone with no pets. Her main hobby  is painting. Her daughter Faith Rogue lives in [place] and is also a homemaker. The patient has 2 grandchildren. She attends [employer]. The patient's daughter [name] is her healthcare power of attorney. [name] can be reached at [phone]   HEALTH MAINTENANCE: Social History  Substance Use Topics  . Smoking status: Former Smoker -- 0.75 packs/day for 3 years    Types: Cigarettes    Quit date: 03/11/1969  . Smokeless tobacco: Never Used  . Alcohol Use: 0.0 oz/week    0 Standard drinks or equivalent per week     Comment: occasional wine     Colonoscopy: 20/10/[facility]  GGY:IRSWNI post hysterectomy  Bone density:2014/osteopenia  Lipid panel:  Allergies  Allergen Reactions  . Dyazide [Hydrochlorothiazide W-Triamterene]     Presumption: drug induced vasculitis  . Niacin Other (See Comments)    flushing  .  Penicillins Diarrhea and Nausea And Vomiting    Current Outpatient Prescriptions  Medication Sig Dispense Refill  . acetaminophen (TYLENOL) 500 MG tablet Take 500 mg by mouth 2 (two) times daily as needed for mild pain. Reported on 06/16/2015    . atorvastatin (LIPITOR) 20 MG tablet Take 1 tablet (20 mg total) by mouth daily. (Patient taking differently: Take 10 mg by mouth daily. ) 90 tablet 3  . B Complex-C-E-Zn (B COMPLEX-C-E-ZINC) tablet Take 1 tablet by mouth daily.    . Calcium-Vitamin D (CALTRATE 600 PLUS-VIT D PO) Take 1 tablet by mouth 2 (two) times daily.     [name] doxycycline (VIBRAMYCIN) 100 MG capsule Take 100 mg by mouth daily.    [name] esomeprazole (NEXIUM) 40 MG capsule TAKE 1 BY MOUTH DAILY 90 capsule 2  . fexofenadine (ALLEGRA) 180 MG tablet Take 180 mg by mouth daily as needed for allergies.     . hypromellose (SYSTANE OVERNIGHT THERAPY) 0.3 % GEL ophthalmic ointment Place 1 application into both eyes at bedtime as needed for dry eyes.    . IBUPROFEN PO Take by mouth as needed.    [name] ipratropium (ATROVENT) 0.03 % nasal spray Place 2 sprays into the nose every 12 (twelve) hours. 180 mL 4  . Lactobacillus-Inulin (CULTURELLE DIGESTIVE HEALTH PO) Take 1 capsule by mouth daily.    [name] levothyroxine (SYNTHROID, LEVOTHROID) 75 MCG tablet Take 1 tablet (75 mcg total) by mouth daily. 90 tablet 2  . nadolol (CORGARD) 20 MG tablet Take 0.5 tablets (10 mg total) by mouth daily. 90 tablet 1  . Propylene Glycol (SYSTANE BALANCE) 0.6 % SOLN Apply to eye as needed.    . rivaroxaban (XARELTO) 20 MG TABS tablet Take 1 tablet (20 mg total) by mouth daily with supper. 30 tablet 11  . venlafaxine XR (EFFEXOR-XR) 150 MG 24 hr capsule Take 1 capsule (150 mg total) by mouth daily. 90 capsule 3   No current facility-administered medications for this visit.    OBJECTIVE: Older white woman In no acute distress Filed Vitals:   06/26/15 0946  BP: 132/64  Pulse: 72  Temp: 97.6 F (36.4 C)  Resp: 18     Body  mass index is 23.82 kg/(m^2).    ECOG FS:1 - Symptomatic but completely ambulatory  Sclerae unicteric, pupils round and equal Oropharynx clear and moist-- no thrush or other lesions No cervical or supraclavicular adenopathy Lungs no rales or rhonchi Heart regular rate and rhythm Abd soft, nontender, positive bowel sounds MSK no focal spinal tenderness, no upper extremity lymphedema Neuro: nonfocal, well oriented, appropriate affect  Breasts: The right breast is status post mastectomy. There is no evidence of local recurrence. The right axilla is benign. The left breast is status post lumpectomy and radiation. There is no evidence of local recurrence. The left axilla is benign.   LAB RESULTS:  CMP     Component Value Date/Time   NA 141 06/26/2015 0931   NA 141 06/01/2015 1108   K 4.5 06/26/2015 0931   K 4.1 06/01/2015 1108   CL 102 06/01/2015 1108   CO2 27 06/26/2015 0931   CO2 30 06/01/2015 1108   GLUCOSE 97 06/26/2015 0931   GLUCOSE 78 06/01/2015 1108   GLUCOSE 88 02/06/2006 1059   BUN 21.2 06/26/2015 0931   BUN 23 06/01/2015 1108   CREATININE 1.1 06/26/2015 0931   CREATININE 1.01 06/01/2015 1108   CALCIUM 10.4 06/26/2015 0931   CALCIUM 10.3 06/01/2015 1108   PROT 7.8 06/26/2015 0931   PROT 8.2 06/01/2015 1108   ALBUMIN 4.3 06/26/2015 0931   ALBUMIN 4.7 06/01/2015 1108   AST 22 06/26/2015 0931   AST 22 06/01/2015 1108   ALT 16 06/26/2015 0931   ALT 16 06/01/2015 1108   ALKPHOS 79 06/26/2015 0931   ALKPHOS 78 06/01/2015 1108   BILITOT 0.49 06/26/2015 0931   BILITOT 0.4 06/01/2015 1108   GFRNONAA 56* 06/14/2014 1025   GFRAA 65* 06/14/2014 1025    INo results found for: SPEP, UPEP  Lab Results  Component Value Date   WBC 5.2 06/26/2015   NEUTROABS 3.4 06/26/2015   HGB 12.6 06/26/2015   HCT 36.6 06/26/2015   MCV 90.6 06/26/2015   PLT 198 06/26/2015      Chemistry      Component Value Date/Time   NA 141 06/26/2015 0931   NA 141 06/01/2015 1108   K 4.5  06/26/2015 0931   K 4.1 06/01/2015 1108   CL 102 06/01/2015 1108   CO2 27 06/26/2015 0931   CO2 30 06/01/2015 1108   BUN 21.2 06/26/2015 0931   BUN 23 06/01/2015 1108   CREATININE 1.1 06/26/2015 0931   CREATININE 1.01 06/01/2015 1108      Component Value Date/Time   CALCIUM 10.4 06/26/2015 0931   CALCIUM 10.3 06/01/2015 1108   ALKPHOS 79 06/26/2015 0931   ALKPHOS 78 06/01/2015 1108   AST 22 06/26/2015 0931   AST 22 06/01/2015 1108   ALT 16 06/26/2015 0931   ALT 16 06/01/2015 1108   BILITOT 0.49 06/26/2015 0931   BILITOT 0.4 06/01/2015 1108       No results found for: LABCA2  No components found for: LABCA125  No results for input(s): INR in the last 168 hours.  Urinalysis    Component Value Date/Time   COLORURINE YELLOW 05/17/2013 1330   APPEARANCEUR CLEAR 05/17/2013 1330   LABSPEC 1.005 08/09/2014 1052   LABSPEC 1.025 05/17/2013 1330   PHURINE 7.0 08/09/2014 1052   PHURINE 5.5 05/17/2013 1330   GLUCOSEU Negative 08/09/2014 1052   GLUCOSEU NEGATIVE 05/17/2013 1330   HGBUR Negative 08/09/2014 1052   HGBUR NEGATIVE 05/17/2013 1330   BILIRUBINUR Negative 08/09/2014 1052   BILIRUBINUR n 06/21/2013 1115   BILIRUBINUR NEGATIVE 05/17/2013 1330   KETONESUR Negative 08/09/2014 1052   KETONESUR NEGATIVE 05/17/2013 1330   PROTEINUR Negative 08/09/2014 1052   PROTEINUR n 06/21/2013 1115   PROTEINUR NEGATIVE 05/17/2013 1330   UROBILINOGEN 0.2 08/09/2014 1052   UROBILINOGEN 0.2 06/21/2013 1115   UROBILINOGEN 0.2 05/17/2013 1330   NITRITE Negative 08/09/2014 1052   NITRITE  n 06/21/2013 1115   NITRITE NEGATIVE 05/17/2013 1330   LEUKOCYTESUR Negative 08/09/2014 1052   LEUKOCYTESUR Trace 06/21/2013 1115    STUDIES: Dg Foot Complete Left  06/15/2015  CLINICAL DATA:  Follow-up fracture . EXAM: LEFT FOOT - COMPLETE 3+ VIEW COMPARISON:  06/01/2015. FINDINGS: Fracture of the proximal phalanx of the left fourth digit noted. This is unchanged in appearance. No radiopaque foreign  body. No other focal abnormality . IMPRESSION: Fracture of the proximal phalanx of the left fourth digit. No change in appearance. Electronically Signed   By: [place]   On: 06/15/2015 16:15   Dg Foot Complete Left  06/01/2015  CLINICAL DATA:  Injury.  Pain. EXAM: LEFT FOOT - COMPLETE 3+ VIEW COMPARISON:  None. FINDINGS: Oblique slightly displaced fracture noted the proximal phalanx of the left fourth digit. No other focal abnormality identified. IMPRESSION: Oblique slightly displaced fracture of the proximal phalanx of left fourth digit. Electronically Signed   By: Marcello Moores  Register   On: 06/01/2015 15:50    ASSESSMENT: 75 y.o. [place] woman  (1) status post right modified radical mastectomy November 1998 for a pT2 pN0, stage IIA invasive ductal carcinoma, grade 2, estrogen and progesterone receptor positive, HER-2 not amplified  (a) adjuvant chemotherapy consisted of doxorubicin and cyclophosphamide 4  (b) adjuvant anti-estrogens consisted of tamoxifen, then letrozole, for a total of 5 years  (2) status post left breast lower outer quadrant biopsy 04/07/2014 for a clinical T1c N0,stage IA  invasive ductal carcinoma, with squamous features, estrogen and progesterone receptor negative, HER-2 not amplified, with an MIB-1 of 10%   (3) left lumpectomy and sentinel lymph node biopsy 05/05/2014 showed aT1c pN0, stage IA invasive ductal carcinoma, grade 1, triple negative, with close but negative margins.   (4) Mammaprint classifies the tumor as basal like, high risk, and predicts a 30% chance of recurrence within 10 years with local treatment only.   (5) start of treatment delayed because of bilateral pneumothoraces after port placement. Adjuvant chemotherapy fiinally started 07/11/2014 consisting of carboplatin and docetaxel given every 21 days 4, completed 09/13/2014  (6) adjuvant radiation 10/31/2014-12/15/2014:  Left breast/ 45 Gy at 1.8 Gy per fraction x 25 fractions.  Left breast  boost/ 16 Gy at 2 Gy per fraction x 8 fractions  (7) genetics testing March 2016 showed no deleterious mutation in the [employer] panel  [[facility]] including sequencing and rearrangement analysis for the following 24 genes:ATM, BARD1, BRCA1, BRCA2, BRIP1, CDH1, CHEK2, EPCAM, MLH1, MRE11A, MSH2, MSH6, MUTYH, NBN, NF1, PALB2, PMS2, PTEN, RAD50, RAD51C, RAD51D, SMARCA4, STK11, and TP53.   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    PLAN: Kerisha is now a little over one year from her definitive surgery with no evidence of disease recurrence. This is very favorable  We discussed exercise again and I gave her information on the [facility] program. She wasn't assessed.  I'm going to see her again in 6 months and then again in one year. After the one year visit we will start seeing her once a year until she completes her 5 years of follow-up  She knows to call for any problems that may develop before her next visit here.  Chauncey Cruel, MD   06/26/2015 10:08 AM

## 2015-06-26 NOTE — Telephone Encounter (Signed)
lvm to inform patient of 6 month fu appt in Oct per 4/17 pof

## 2015-06-29 ENCOUNTER — Ambulatory Visit (INDEPENDENT_AMBULATORY_CARE_PROVIDER_SITE_OTHER)
Admission: RE | Admit: 2015-06-29 | Discharge: 2015-06-29 | Disposition: A | Discharge status: Home / Self Care | Payer: Medicare Other | Source: Ambulatory Visit | Attending: Family Medicine | Admitting: Family Medicine

## 2015-06-29 ENCOUNTER — Other Ambulatory Visit: Payer: Self-pay | Admitting: Oncology

## 2015-06-29 DIAGNOSIS — M8580 Other specified disorders of bone density and structure, unspecified site: Secondary | ICD-10-CM

## 2015-06-29 DIAGNOSIS — Z780 Asymptomatic menopausal state: Secondary | ICD-10-CM | POA: Diagnosis not present

## 2015-07-03 DIAGNOSIS — H25811 Combined forms of age-related cataract, right eye: Secondary | ICD-10-CM | POA: Diagnosis not present

## 2015-07-03 DIAGNOSIS — H2511 Age-related nuclear cataract, right eye: Secondary | ICD-10-CM | POA: Diagnosis not present

## 2015-07-04 ENCOUNTER — Ambulatory Visit (INDEPENDENT_AMBULATORY_CARE_PROVIDER_SITE_OTHER): Payer: Medicare Other | Admitting: Adult Health

## 2015-07-04 ENCOUNTER — Encounter: Payer: Self-pay | Admitting: Adult Health

## 2015-07-04 VITALS — BP 134/62 | HR 67 | Temp 97.9°F

## 2015-07-04 DIAGNOSIS — R51 Headache: Secondary | ICD-10-CM

## 2015-07-04 DIAGNOSIS — R519 Headache, unspecified: Secondary | ICD-10-CM

## 2015-07-04 LAB — SEDIMENTATION RATE: Sed Rate: 26 mm/hr — ABNORMAL HIGH (ref 0–22)

## 2015-07-04 LAB — C-REACTIVE PROTEIN: CRP: 0.7 mg/dL (ref 0.5–20.0)

## 2015-07-04 NOTE — Progress Notes (Signed)
Subjective:    Patient ID: Laura Li, female    DOB: 10-13-1940, 75 y.o.   MRN: [id number]  HPI  Patient with 1 day of right sided head pain. The pain is described as sharp and can feel " electric like". She first noticed it when she was washing her hair today. She felt a " electric like shock" . She has also noticed it when she has been combing her hair. The pain comes and goes.   She denies any fevers, loss of vision ( just had cataracts removed) or jaw pain ( does have TMJ).   She does have headaches chronically and chronic fatigue.   Review of Systems  Constitutional: Positive for fatigue.  HENT: Negative for facial swelling and hearing loss.        Pain on right side of head with brushing on hair. No pain in temples.   Eyes: Positive for visual disturbance. Negative for photophobia, pain and discharge.  Respiratory: Negative.   Cardiovascular: Negative.   Neurological: Positive for headaches.  All other systems reviewed and are negative.  Past Medical History  Diagnosis Date  . OA (osteoarthritis)   . History of DVT (deep vein thrombosis)   . Hyperlipidemia   . Hypertension   . Hypothyroidism   . HX: breast cancer     melanoma  . Melanoma ([place])   . Eczema   . Arthritis   . Esophageal stricture   . GERD (gastroesophageal reflux disease)   . PONV (postoperative nausea and vomiting)   . Wears glasses   . Depression     states no depression  . Chronic kidney disease     some renal impairment from meds  . Headache   . Anemia     from medicaions  . Heart murmur     states it is benign told 30-40 years ago  . Pneumothorax on right 06/15/2014    Social History   Social History  . Marital Status: Widowed    Spouse Name: N/A  . Number of Children: N/A  . Years of Education: N/A   Occupational History  . retired    Social History Main Topics  . Smoking status: Former Smoker -- 0.75 packs/day for 3 years    Types: Cigarettes    Quit date: 03/11/1969  .  Smokeless tobacco: Never Used  . Alcohol Use: 0.0 oz/week    0 Standard drinks or equivalent per week     Comment: occasional wine  . Drug Use: No  . Sexual Activity: No   Other Topics Concern  . Not on file   Social History Narrative   Widowed 2003. Moved to [place] from [place] in 2004 after loss of husband to be near daughter. 1 daughter. 2 grandkids.       Retired from working at a church      Hobbies: Training and development officer    Past Surgical History  Procedure Laterality Date  . Appendectomy  1967  . Melanoma excision  2004    right side of face  . 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 Total abdominal hysterectomy  1992    including ovaries  . Tonsillectomy and adenoidectomy  1949  . Lipoma excision  1978    right breast  . Mastectomy  1998    right-nodes out  . Breast lumpectomy      left breast-benign  . Breast surgery  1977    removal of calcified milk gland right breast  .  Colonoscopy    . Radioactive seed guided mastectomy with axillary sentinel lymph node biopsy Left 05/05/2014    Procedure: RADIOACTIVE SEED GUIDED LEFT LUMPECTOMY WITH AXILLARY SENTINEL LYMPH NODE BIOPSY;  Surgeon: Rolm Bookbinder, MD;  Location: [facility];  Service: General;  Laterality: Left;  . Chest tube insertion  06/15/2014  . Portacath placement Left 06/15/2014    Procedure: INSERTION PORT-A-CATH;  Surgeon: Rolm Bookbinder, MD;  Location: [facility];  Service: General;  Laterality: Left;  . Chest tube insertion Bilateral 06/15/2014    Procedure: CHEST TUBE INSERTION;  Surgeon: Rolm Bookbinder, MD;  Location: [facility];  Service: General;  Laterality: Bilateral;  . Port-a-cath removal N/A 10/05/2014    Procedure: REMOVAL PORT-A-CATH;  Surgeon: Rolm Bookbinder, MD;  Location: [facility];  Service: General;  Laterality: N/A;    Family History  Problem Relation Age of Onset  . Heart disease Father 35    smoker  . Endometrial cancer Mother 75  . Breast cancer Cousin       x 3 cousins with breast cancer (one at 3, one at 32 and one at 49)  . Lung cancer Paternal Aunt     smoker  . Brain cancer Maternal Aunt   . Colon cancer Neg Hx   . Diabetes Maternal Aunt     ???  . Breast cancer Maternal Aunt 51    Allergies  Allergen Reactions  . Dyazide [Hydrochlorothiazide W-Triamterene]     Presumption: drug induced vasculitis  . Niacin Other (See Comments)    flushing  . Penicillins Diarrhea and Nausea And Vomiting    Current Outpatient Prescriptions on File Prior to Visit  Medication Sig Dispense Refill  . acetaminophen (TYLENOL) 500 MG tablet Take 500 mg by mouth 2 (two) times daily as needed for mild pain. Reported on 06/16/2015    . atorvastatin (LIPITOR) 20 MG tablet Take 1 tablet (20 mg total) by mouth daily. (Patient taking differently: Take 10 mg by mouth daily. ) 90 tablet 3  . B Complex-C-E-Zn (B COMPLEX-C-E-ZINC) tablet Take 1 tablet by mouth daily.    . Calcium-Vitamin D (CALTRATE 600 PLUS-VIT D PO) Take 1 tablet by mouth 2 (two) times daily.     [name] doxycycline (VIBRAMYCIN) 100 MG capsule Take 100 mg by mouth daily.    [name] esomeprazole (NEXIUM) 40 MG capsule TAKE 1 BY MOUTH DAILY 90 capsule 2  . fexofenadine (ALLEGRA) 180 MG tablet Take 180 mg by mouth daily as needed for allergies.     . hypromellose (SYSTANE OVERNIGHT THERAPY) 0.3 % GEL ophthalmic ointment Place 1 application into both eyes at bedtime as needed for dry eyes.    . IBUPROFEN PO Take by mouth as needed.    [name] ipratropium (ATROVENT) 0.03 % nasal spray Place 2 sprays into the nose every 12 (twelve) hours. 180 mL 4  . Lactobacillus-Inulin (CULTURELLE DIGESTIVE HEALTH PO) Take 1 capsule by mouth daily.    [name] levothyroxine (SYNTHROID, LEVOTHROID) 75 MCG tablet Take 1 tablet (75 mcg total) by mouth daily. 90 tablet 2  . nadolol (CORGARD) 20 MG tablet Take 0.5 tablets (10 mg total) by mouth daily. 90 tablet 1  . Propylene Glycol (SYSTANE BALANCE) 0.6 % SOLN Apply to eye as needed.    .  rivaroxaban (XARELTO) 20 MG TABS tablet Take 1 tablet (20 mg total) by mouth daily with supper. 30 tablet 11  . venlafaxine XR (EFFEXOR-XR) 150 MG 24 hr capsule Take 1 capsule (150 mg total) by  mouth daily. 90 capsule 3   No current facility-administered medications on file prior to visit.    BP 134/62 mmHg  Pulse 67  Temp(Src) 97.9 F (36.6 C) (Oral)       Objective:   Physical Exam  Constitutional: She is oriented to person, place, and time. She appears well-developed and well-nourished. No distress.  Eyes: Conjunctivae and EOM are normal. Pupils are equal, round, and reactive to light. Right eye exhibits no discharge. Left eye exhibits no discharge.  Cardiovascular: Normal rate, regular rhythm, normal heart sounds and intact distal pulses.  Exam reveals no gallop and no friction rub.   No murmur heard. Pulmonary/Chest: Effort normal and breath sounds normal. No respiratory distress. She has no wheezes. She has no rales. She exhibits no tenderness.  Musculoskeletal: Normal range of motion. She exhibits no edema or tenderness.  Neurological: She is alert and oriented to person, place, and time.  Skin: Skin is dry. No rash noted. She is not diaphoretic. No erythema. No pallor.  Psychiatric: She has a normal mood and affect. Her behavior is normal. Judgment and thought content normal.  Nursing note and vitals reviewed.     Assessment & Plan:  1. Headache, unspecified headache type - No rash or vesicles seen, doubt shingles. Need to r/o temporal arteritis.  - C-reactive Protein - Sedimentation Rate - She is to call Dr. Yong Channel if she sees a rash. Follow up with him on Thursday as directed.   Dorothyann Peng, NP

## 2015-07-04 NOTE — Patient Instructions (Signed)
It was great meeting you today!  I will follow up with you regarding your lab work.   If you develop a rash then please call Dr. Yong Channel right away/.

## 2015-07-07 ENCOUNTER — Encounter: Payer: Self-pay | Admitting: Family Medicine

## 2015-07-07 ENCOUNTER — Ambulatory Visit (INDEPENDENT_AMBULATORY_CARE_PROVIDER_SITE_OTHER): Payer: Medicare Other | Admitting: Family Medicine

## 2015-07-07 VITALS — BP 130/72 | HR 83 | Temp 97.7°F | Wt 148.0 lb

## 2015-07-07 DIAGNOSIS — I82541 Chronic embolism and thrombosis of right tibial vein: Secondary | ICD-10-CM | POA: Diagnosis not present

## 2015-07-07 DIAGNOSIS — M8580 Other specified disorders of bone density and structure, unspecified site: Secondary | ICD-10-CM

## 2015-07-07 DIAGNOSIS — S92502A Displaced unspecified fracture of left lesser toe(s), initial encounter for closed fracture: Secondary | ICD-10-CM

## 2015-07-07 MED ORDER — RIVAROXABAN 20 MG PO TABS: 20.0000 mg | ORAL_TABLET | Freq: Every day | ORAL | Status: DC

## 2015-07-07 NOTE — Patient Instructions (Signed)
We will call you within a week about your referral to [facility]. If you do not hear within 2 weeks, give [place] a call.   Continue to wear your post op shoe and buddy tape for now.   I would advise starting fosamax but I understand your hesitation. Please ask Dr. Doran Durand for his opinion- show him your bone density report  Continue xarelto for now until you see hematology
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
## 2015-07-13 ENCOUNTER — Ambulatory Visit: Payer: Medicare Other | Admitting: Oncology

## 2015-07-13 ENCOUNTER — Other Ambulatory Visit: Payer: Medicare Other

## 2015-07-27 ENCOUNTER — Encounter: Payer: Self-pay | Admitting: Internal Medicine

## 2015-08-14 DIAGNOSIS — L84 Corns and callosities: Secondary | ICD-10-CM | POA: Diagnosis not present

## 2015-08-14 DIAGNOSIS — S92502A Displaced unspecified fracture of left lesser toe(s), initial encounter for closed fracture: Secondary | ICD-10-CM | POA: Diagnosis not present

## 2015-08-14 DIAGNOSIS — M79672 Pain in left foot: Secondary | ICD-10-CM | POA: Diagnosis not present

## 2015-09-01 ENCOUNTER — Ambulatory Visit (INDEPENDENT_AMBULATORY_CARE_PROVIDER_SITE_OTHER): Payer: Medicare Other | Admitting: Family Medicine

## 2015-09-01 ENCOUNTER — Encounter: Payer: Self-pay | Admitting: Family Medicine

## 2015-09-01 VITALS — BP 126/72 | HR 72 | Ht 66.0 in | Wt 149.0 lb

## 2015-09-01 DIAGNOSIS — I82591 Chronic embolism and thrombosis of other specified deep vein of right lower extremity: Secondary | ICD-10-CM

## 2015-09-01 DIAGNOSIS — I82509 Chronic embolism and thrombosis of unspecified deep veins of unspecified lower extremity: Secondary | ICD-10-CM | POA: Insufficient documentation

## 2015-09-01 DIAGNOSIS — I10 Essential (primary) hypertension: Secondary | ICD-10-CM

## 2015-09-01 DIAGNOSIS — E785 Hyperlipidemia, unspecified: Secondary | ICD-10-CM | POA: Diagnosis not present

## 2015-09-01 DIAGNOSIS — M8580 Other specified disorders of bone density and structure, unspecified site: Secondary | ICD-10-CM

## 2015-09-01 DIAGNOSIS — Z23 Encounter for immunization: Secondary | ICD-10-CM | POA: Diagnosis not present

## 2015-09-01 MED ORDER — ALENDRONATE SODIUM 70 MG PO TABS
70.0000 mg | ORAL_TABLET | ORAL | Status: DC
Start: 2015-09-01 — End: 2015-12-05

## 2015-09-01 NOTE — Assessment & Plan Note (Signed)
S: localized swelling along right lower leg medially improving with xarelto- documented likely Chronic DVT near this area by last oncology visit. Compliant with xarelto 20mg  A/P: she has follow up with oncology/hematology and would ask their opinion on long term therapy- if area on ankle resolves completely (no longer tender or raised) would consider repeat films

## 2015-09-01 NOTE — Assessment & Plan Note (Signed)
S: poorly controlled on statin. She has problems with names and names of things at times- she wonders if statin side effect. No myalgias.  Lab Results  Component Value Date   CHOL 215* 12/21/2013   HDL 37.70* 12/21/2013   LDLCALC 124* 06/21/2013   LDLDIRECT 112.0 06/01/2015   TRIG 242.0* 12/21/2013   CHOLHDL 6 12/21/2013   A/P: we will stop statin- indication for primary prevention unclear- repeat at follow up

## 2015-09-01 NOTE — Addendum Note (Signed)
Addended by: Mariam Dollar, Roselyn Reef M on: 09/01/2015 11:45 AM   Modules accepted: Orders

## 2015-09-01 NOTE — Progress Notes (Signed)
Pre visit review using our clinic review tool, if applicable. No additional management support is needed unless otherwise documented below in the visit note. 

## 2015-09-01 NOTE — Patient Instructions (Signed)
At a minimum, recommend 800 IU of vitamin D and 1200mg  of Calcium per day. You can get this with a calcium-vitamin D supplement. You are already doing this.  I would start taking fosamax 70mg  once a week.  Administer first thing in the morning and >30 minutes before the first food, beverage (except plain water), or other medication of the day. Do not take with mineral water or with other beverages. Stay upright (not to lie down) for at least 30 minutes after taking medicine and until after first food of the day (to reduce irritation). Must be taken with 6 to 8 oz of plain water. The tablet should be swallowed whole; do not chew or suck.sent to [facility] mail over  Take levothyroxine 30 minutes after fosamax and then eat 30 minutes after that  Continue xarelto and ask oncology about long term therapy  Come off cholesterol medicine- see me in 3 months with labs a few days before to decide if we want to be on or off medicine long term  I would also like for you to sign up for an annual wellness visit on a Friday with our nurse Manuela Schwartz. This is a free benefit under medicare that may help [place] find additional ways to help you.
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
## 2015-09-05 ENCOUNTER — Other Ambulatory Visit: Payer: Self-pay | Admitting: General Practice

## 2015-09-05 DIAGNOSIS — J309 Allergic rhinitis, unspecified: Secondary | ICD-10-CM

## 2015-09-05 MED ORDER — IPRATROPIUM BROMIDE 0.03 % NA SOLN: 2.0000 | Freq: Two times a day (BID) | NASAL | Status: DC

## 2015-09-06 ENCOUNTER — Telehealth: Payer: Self-pay | Admitting: Family Medicine

## 2015-09-06 NOTE — Telephone Encounter (Signed)
Pt needs refill ipratropium ns#3 w/refill [facility] mail order

## 2015-09-07 ENCOUNTER — Other Ambulatory Visit: Payer: Self-pay

## 2015-09-07 ENCOUNTER — Encounter: Payer: Self-pay | Admitting: Family Medicine

## 2015-09-07 DIAGNOSIS — J309 Allergic rhinitis, unspecified: Secondary | ICD-10-CM

## 2015-09-07 MED ORDER — IPRATROPIUM BROMIDE 0.03 % NA SOLN: 2.0000 | Freq: Two times a day (BID) | NASAL | Status: DC

## 2015-09-07 NOTE — Telephone Encounter (Signed)
Refill sent to [facility]. Patient called and notified via voicemail message.

## 2015-10-05 NOTE — Progress Notes (Addendum)
Subjective:   Laura Li is a 75 y.o. female who presents for Medicare Annual (Subsequent) preventive examination.  Review of Systems:  HRA assessment completed during this visit with [facility] is good What would make it great? Worries about dementia  The Patient was informed that the wellness visit is to identify future health risk and educate and initiate measures that can reduce risk for increased disease through the lifespan.    NO ROS; Medicare Wellness Visit Last OV:  07/07/2015 Labs completed: various labs this year LDL 112; (Triglycerids 242 2015; Melanoma; 2004; in situ; had chemo last year and radiation; Chemo brain could be an issue and stated the oncologist educated her but still feels she has an issue  HTN; medically managed  Hyperlipidemia; 05/05/14; seed guided mastectomy; 2nd time she has had breast cancer but first time was a different cancer; oncologist   Smoking 2.25 pack years; quit many years ago ETOH 0  Family hx; endometrial cancer, HD; Breast cancer; lung cancer; DM   Psychosocial: Widowed 2003. Moved to [place] from [place] in 2004 after loss of husband to be near daughter. 1 daughter. 2 grandkids.  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 No issues  Exercise;  [place] recently; seen bug and chasing insect and fell over coffee table  Cannot get up very easily when on the ground; recent fall trying to get up.  Did not get hurt either fall Not exercising at all  Lives in 2 story home and up and down stairs Can't get up anymore due to knee issues Can walk but has not started; Needs to exercise for bone health and overall strengthening;  Will check on [employer] phys ed program that will work with older adults on an exercise program    Also agreed to check out different gym to see if there is a walking course at the facility; will also try stationary bike or the elliptical to determine what she prefers   HOME SAFETY: lives alone  Thinking about moving to a smaller home; Lives alone Very much a loner;  reviewed for short term and long term Goals  Issues reviewed that may potentiate risk include multilevel or inaccessible homes or long term plan including memory; falls etc.   Driving safety for seniors or any recent accidents.States she does have to stop and think about routes but has not gotten lost. Fall hx; yes; recently in home Fear of falling? Yes; knee issues  Has inner ear dizziness at times ; takes allergies  UA or BOWEL incontinence; not really Functional losses from last year to this year? Could improve strength Given education on "Fall Prevention in the Home" for more safety tips the patient can apply as appropriate.   Risk for Depression reviewed: Any emotional problems? Anxious, depressed, irritable, sad or blue? Right now does feel "down" and depressed some but not overwhelmed with depression. Denies feeling depressed or hopeless; voices pleasure in daily life"   Cognitive; states she is concerned;  Spent time educating on memory and coping skills  Does cross word puzzles; just got lumosity; does crossword puzzles  Manages checkbook, medications; no failures of task Recent memory is not good; but always had great memory  Ad8 score reviewed  for issues;  Issues making decisions; no  Poor judgement; no   Less interest in hobbies / activities" no  Do some of this; she is Training and development officer; haven't painted x 3 years signature member of the [employer] [place] show will be in [employer] in the fall   Repeats questions, stories; family complaining: NO  Trouble using ordinary gadgets; microwave; computer: no  New computer this week; but switching from [employer] to [employer] the month or year: no  Mismanaging  finances: no/ take care of all finances   Missing apt: no but does write them down  Daily problems with thinking of memory NO Ad8 score is 0  Challenge the language; scrabble, word puzzles;  MMSE completed 28/30  Missed date by 1 day and 2/3 recall items.  Advanced Directive addressed; Completed or educated   Counseling Health Maintenance Gaps:  Colonoscopy; 11/2008/ aged out  EKG: 04/2014 Mammogram: 04/2015; cancer tx last year  Dexa/  06/2015 Tscore -2.4; on meds  Total loss in height of 1.5 inches from your original height Calcium 1200mg  with Vit D 800u per day; more as directed by physician Strength building exercises discussed; can include walking; housework; small weights or stretch bands; silver sneakers [facility] to the [facility]  PAP: educated regarding the need for GYN exam;   Hearing: has hearing aid  Ophthalmology exam; just had cataract surgery on both eyes   Immunizations Due: (Vaccines reviewed and educated regarding any overdue)    Established and updated Risk reviewed and appropriate referral made or health recommendations:  Current Care Team reviewed and updated      Objective:     Vitals: Ht 5\' 6"  (1.676 m)   Wt 147 lb 2 oz (66.7 kg)   BMI 23.75 kg/m   Body mass index is 23.75 kg/m.   Tobacco History  Smoking Status  . Former Smoker  . Packs/day: 0.75  . Years: 3.00  . Types: Cigarettes  . Quit date: 03/11/1969  Smokeless Tobacco  . Never Used     Counseling given: Not Answered   Past Medical History:  Diagnosis Date  . Anemia    from medicaions  . Arthritis   . Chronic kidney disease    some renal impairment from meds  . Depression    states no depression  . Eczema   . Esophageal stricture   . GERD (gastroesophageal reflux disease)   . Headache   . Heart murmur    states it is benign told 30-40 years ago  . History of DVT (deep vein thrombosis)   . HX: breast cancer    melanoma  . Hyperlipidemia   . Hypertension   .  Hypothyroidism   . Melanoma ([place])   . OA (osteoarthritis)   . Pneumothorax on right 06/15/2014  . PONV (postoperative nausea and vomiting)   . Wears glasses    Past Surgical History:  Procedure Laterality Date  . APPENDECTOMY  1967  . BREAST LUMPECTOMY     left breast-benign  . BREAST SURGERY  1977   removal of calcified milk gland right breast  . CHEST TUBE INSERTION  06/15/2014  . CHEST TUBE INSERTION Bilateral 06/15/2014   Procedure: CHEST TUBE INSERTION;  Surgeon: Rolm Bookbinder, MD;  Location: [place];  Service: General;  Laterality: Bilateral;  . COLONOSCOPY    . LIPOMA EXCISION  1978   right breast  . MASTECTOMY  1998   right-nodes out  . MELANOMA EXCISION  2004   right  side of face  . MOHS SURGERY  2005   right left-basal cell  . PORT-A-CATH REMOVAL N/A 10/05/2014   Procedure: REMOVAL PORT-A-CATH;  Surgeon: Rolm Bookbinder, MD;  Location: [place];  Service: General;  Laterality: N/A;  . PORTACATH PLACEMENT Left 06/15/2014   Procedure: INSERTION PORT-A-CATH;  Surgeon: Rolm Bookbinder, MD;  Location: [place];  Service: General;  Laterality: Left;  . RADIOACTIVE SEED GUIDED MASTECTOMY WITH AXILLARY SENTINEL LYMPH NODE BIOPSY Left 05/05/2014   Procedure: RADIOACTIVE SEED GUIDED LEFT LUMPECTOMY WITH AXILLARY SENTINEL LYMPH NODE BIOPSY;  Surgeon: Rolm Bookbinder, MD;  Location: [place];  Service: General;  Laterality: Left;  . TONSILLECTOMY AND ADENOIDECTOMY  1949  . TOTAL ABDOMINAL HYSTERECTOMY  1992   including ovaries   Family History  Problem Relation Age of Onset  . Heart disease Father 28    smoker  . Endometrial cancer Mother 64  . Breast cancer Cousin     x 3 cousins with breast cancer (one at 67, one at 42 and one at 56)  . Lung cancer Paternal Aunt     smoker  . Brain cancer Maternal Aunt   . Diabetes Maternal Aunt     ???  . Breast cancer Maternal Aunt 51  . Colon cancer Neg Hx    History  Sexual Activity  . Sexual  activity: No    Outpatient Encounter Prescriptions as of 10/06/2015  Medication Sig  . acetaminophen (TYLENOL) 500 MG tablet Take 500 mg by mouth 2 (two) times daily as needed for mild pain. Reported on 07/07/2015  . alendronate (FOSAMAX) 70 MG tablet Take 1 tablet (70 mg total) by mouth every 7 (seven) days. Take with a full glass of water on an empty stomach.  . B Complex-C-E-Zn (B COMPLEX-C-E-ZINC) tablet Take 1 tablet by mouth daily.  . Calcium-Vitamin D (CALTRATE 600 PLUS-VIT D PO) Take 1 tablet by mouth 2 (two) times daily.   [name] esomeprazole (NEXIUM) 40 MG capsule TAKE 1 BY MOUTH DAILY  . fexofenadine (ALLEGRA) 180 MG tablet Take 180 mg by mouth daily as needed for allergies.   . hypromellose (SYSTANE OVERNIGHT THERAPY) 0.3 % GEL ophthalmic ointment Place 1 application into both eyes at bedtime as needed for dry eyes.  [name] ipratropium (ATROVENT) 0.03 % nasal spray Place 2 sprays into the nose every 12 (twelve) hours.  . Lactobacillus-Inulin (CULTURELLE DIGESTIVE HEALTH PO) Take 1 capsule by mouth daily.  [name] levothyroxine (SYNTHROID, LEVOTHROID) 75 MCG tablet Take 1 tablet (75 mcg total) by mouth daily.  . nadolol (CORGARD) 20 MG tablet Take 0.5 tablets (10 mg total) by mouth daily.  [name] Propylene Glycol (SYSTANE BALANCE) 0.6 % SOLN Apply to eye as needed.  . rivaroxaban (XARELTO) 20 MG TABS tablet Take 1 tablet (20 mg total) by mouth daily with supper.  . venlafaxine XR (EFFEXOR-XR) 150 MG 24 hr capsule Take 1 capsule (150 mg total) by mouth daily.   No facility-administered encounter medications on file as of 10/06/2015.     Activities of Daily Living In your present state of health, do you have any difficulty performing the following activities: 10/06/2015 10/06/2015  Hearing? Tempie Donning  Vision? N -  Difficulty concentrating or making decisions? Y -  Walking or climbing stairs? N -  Dressing or bathing? N -  Doing errands, shopping? N -  Preparing Food and eating ? N -  Using the Toilet? N -    In the past six months, have you accidently  leaked urine? N -  Do you have problems with loss of bowel control? N -  Managing your Medications? N -  Managing your Finances? N -  Housekeeping or managing your Housekeeping? N -  Some recent data might be hidden    Patient Care Team: Marin Olp, MD as PCP - General (Family Medicine) Rolm Bookbinder, MD as Consulting Physician (General Surgery) Thea Silversmith, MD as Consulting Physician (Radiation Oncology) Chauncey Cruel, MD as Consulting Physician (Oncology) Sylvan Cheese, NP as Nurse Practitioner (Hematology and Oncology)    Assessment:    ASSESSMENT:   Focus on memory and exercise;  Encourage to simply; Is looking for long term care housing; whether senior living  Agreed to start exercise program and given options  Osteopenia; reviewed and educated;  Taking meds    Barriers to Success Concern over memory    Exercise Activities and Dietary recommendations    Goals    . Exercise 150 minutes per week (moderate activity)          Goal is to find a rhythm of exercise you enjoy and put on it on your calender Start low and slow  Y or other with a walking trial;  Has a program at [employer] that physiology students did work with older adults     . patient          Rethinking long term care plan [employer]; [phone] Senior Directory; Information regarding Long Term Care  Visit places at different; You can call and go there for lunch'        Fall Risk Fall Risk  10/06/2015 01/19/2015 09/21/2014 03/31/2014 12/04/2012  Falls in the past year? Yes No Yes No No  Number falls in past yr: 2 or more - 1 - -  Injury with Fall? No - Yes - -  Follow up Education provided - Falls prevention discussed - -   Depression Screen PHQ 2/9 Scores 10/06/2015 01/19/2015 09/21/2014 03/31/2014  PHQ - 2 Score 1 0 0 0     Cognitive Testing MMSE - Mini Mental State Exam  10/06/2015  Orientation to time 4  Orientation to Place 5  Registration 3  Attention/ Calculation 5  Recall 2  Language- name 2 objects 2  Language- repeat 1  Language- follow 3 step command 3  Language- read & follow direction 1  Write a sentence 1  Copy design 1  Total score 28    Immunization History  Administered Date(s) Administered  . Influenza Split 02/11/2011  . Influenza Whole 12/21/2008, 01/16/2010  . Influenza,inj,Quad PF,36+ Mos 12/21/2013, 11/22/2014  . Influenza-Unspecified 12/09/2012  . Pneumococcal Conjugate-13 09/01/2015  . Pneumococcal Polysaccharide-23 03/11/2001  . Td 03/19/2010   Screening Tests Health Maintenance  Topic Date Due  . INFLUENZA VACCINE  10/10/2015  . PNA vac Low Risk Adult (2 of 2 - PPSV23) 08/31/2016  . TETANUS/TDAP  03/19/2020  . DEXA SCAN  Addressed  . ZOSTAVAX  Completed      Plan:   Do things that stimulate your mind to think; process, language;  Then forget it!  Check with [employer] to see if they have an exercise student that can work w you?  Try exercise of choice to see what works for you  Start our slow and low   [employer]  [employer]; [phone] Management consultant; Information regarding Long Term Care for future decisions Find a lot of information on the site   During the course of  the visit the patient was educated and counseled about the following appropriate screening and preventive services:   Vaccines to include Pneumoccal, Influenza, Hepatitis B, Td, Zostavax, HCV/ up to date  Electrocardiogram/ deferred   Cardiovascular Disease/ BMI good;   Colorectal cancer screening/ aged out  Bone density screening/ on treatment  Diabetes screening/neg  Glaucoma screening/just had cataract surgery  Mammography/completed feb/2017  Nutrition counseling / limited time due to time spent on exercise and memory issues   Patient Instructions (the written plan) was given to the patient.    [id number], RN  10/06/2015    I have reviewed and agree with note, evaluation, plan. I am thankful for exercise encouragement- patient certainly needs this  [name], MD

## 2015-10-06 ENCOUNTER — Ambulatory Visit (INDEPENDENT_AMBULATORY_CARE_PROVIDER_SITE_OTHER): Payer: Medicare Other

## 2015-10-06 VITALS — Ht 66.0 in | Wt 147.1 lb

## 2015-10-06 DIAGNOSIS — Z0000 Encounter for general adult medical examination without abnormal findings: Secondary | ICD-10-CM

## 2015-10-06 NOTE — Patient Instructions (Addendum)
Laura Li , Thank you for taking time to come for your Medicare Wellness Visit. I appreciate your ongoing commitment to your health goals. Please review the following plan we discussed and let me know if I can assist you in the future.   Do things that stimulate your mind to think; process, language;  Then forget it!  Check with [employer] to see if they have an exercise student that can work w you?  Try exercise of choice to see what works for you  Start our slow and low   [email or web address]  [employer]; [phone] Tax inspector; Information regarding Long Term Care for future decisions Find a lot of information on the site     These are the goals we discussed: Goals    . Exercise 150 minutes per week (moderate activity)          Goal is to find a rhythm of exercise you enjoy and put on it on your calender Start low and slow  Y or other with a walking trial;  Has a program at [employer] that physiology students did work with older adults     . patient          Rethinking long term care plan [email or web address] [email or web address]; [phone] Senior Directory; Information regarding Long Term Care  Visit places at different; You can call and go there for lunch'         This is a list of the screening recommended for you and due dates:  Health Maintenance  Topic Date Due  . Flu Shot  10/10/2015  . Pneumonia vaccines (2 of 2 - PPSV23) 08/31/2016  . Tetanus Vaccine  03/19/2020  . DEXA scan (bone density measurement)  Addressed  . Shingles Vaccine  Completed       Fall Prevention in the Home  Falls can cause injuries. They can happen to people of all ages. There are many things you can do to make your home safe and to help prevent falls.  WHAT CAN I DO ON THE OUTSIDE OF MY HOME?  Regularly fix the edges of walkways and driveways and fix any cracks.  Remove anything that might make you trip as you walk through a door, such as  a raised step or threshold.  Trim any bushes or trees on the path to your home.  Use bright outdoor lighting.  Clear any walking paths of anything that might make someone trip, such as rocks or tools.  Regularly check to see if handrails are loose or broken. Make sure that both sides of any steps have handrails.  Any raised decks and porches should have guardrails on the edges.  Have any leaves, snow, or ice cleared regularly.  Use sand or salt on walking paths during winter.  Clean up any spills in your garage right away. This includes oil or grease spills. WHAT CAN I DO IN THE BATHROOM?   Use night lights.  Install grab bars by the toilet and in the tub and shower. Do not use towel bars as grab bars.  Use non-skid mats or decals in the tub or shower.  If you need to sit down in the shower, use a plastic, non-slip stool.  Keep the floor dry. Clean up any water that spills on the floor as soon as it happens.  Remove soap buildup in the tub or shower regularly.  Attach bath mats securely with double-sided non-slip rug tape.  Do not have throw rugs and other  things on the floor that can make you trip. WHAT CAN I DO IN THE BEDROOM?  Use night lights.  Make sure that you have a light by your bed that is easy to reach.  Do not use any sheets or blankets that are too big for your bed. They should not hang down onto the floor.  Have a firm chair that has side arms. You can use this for support while you get dressed.  Do not have throw rugs and other things on the floor that can make you trip. WHAT CAN I DO IN THE KITCHEN?  Clean up any spills right away.  Avoid walking on wet floors.  Keep items that you use a lot in easy-to-reach places.  If you need to reach something above you, use a strong step stool that has a grab bar.  Keep electrical cords out of the way.  Do not use floor polish or wax that makes floors slippery. If you must use wax, use non-skid floor  wax.  Do not have throw rugs and other things on the floor that can make you trip. WHAT CAN I DO WITH MY STAIRS?  Do not leave any items on the stairs.  Make sure that there are handrails on both sides of the stairs and use them. Fix handrails that are broken or loose. Make sure that handrails are as long as the stairways.  Check any carpeting to make sure that it is firmly attached to the stairs. Fix any carpet that is loose or worn.  Avoid having throw rugs at the top or bottom of the stairs. If you do have throw rugs, attach them to the floor with carpet tape.  Make sure that you have a light switch at the top of the stairs and the bottom of the stairs. If you do not have them, ask someone to add them for you. WHAT ELSE CAN I DO TO HELP PREVENT FALLS?  Wear shoes that:  Do not have high heels.  Have rubber bottoms.  Are comfortable and fit you well.  Are closed at the toe. Do not wear sandals.  If you use a stepladder:  Make sure that it is fully opened. Do not climb a closed stepladder.  Make sure that both sides of the stepladder are locked into place.  Ask someone to hold it for you, if possible.  Clearly mark and make sure that you can see:  Any grab bars or handrails.  First and last steps.  Where the edge of each step is.  Use tools that help you move around (mobility aids) if they are needed. These include:  Canes.  Walkers.  Scooters.  Crutches.  Turn on the lights when you go into a dark area. Replace any light bulbs as soon as they burn out.  Set up your furniture so you have a clear path. Avoid moving your furniture around.  If any of your floors are uneven, fix them.  If there are any pets around you, be aware of where they are.  Review your medicines with your doctor. Some medicines can make you feel dizzy. This can increase your chance of falling. Ask your doctor what other things that you can do to help prevent falls.   This information  is not intended to replace advice given to you by your health care provider. Make sure you discuss any questions you have with your health care provider.   Document Released: 12/22/2008 Document Revised: 07/12/2014 Document Reviewed:  04/01/2014 Elsevier Interactive Patient Education 2016 [place] Maintenance, Female Adopting a healthy lifestyle and getting preventive care can go a long way to promote health and wellness. Talk with your health care provider about what schedule of regular examinations is right for you. This is a good chance for you to check in with your provider about disease prevention and staying healthy. In between checkups, there are plenty of things you can do on your own. Experts have done a lot of research about which lifestyle changes and preventive measures are most likely to keep you healthy. Ask your health care provider for more information. WEIGHT AND DIET  Eat a healthy diet  Be sure to include plenty of vegetables, fruits, low-fat dairy products, and lean protein.  Do not eat a lot of foods high in solid fats, added sugars, or salt.  Get regular exercise. This is one of the most important things you can do for your health.  Most adults should exercise for at least 150 minutes each week. The exercise should increase your heart rate and make you sweat (moderate-intensity exercise).  Most adults should also do strengthening exercises at least twice a week. This is in addition to the moderate-intensity exercise.  Maintain a healthy weight  Body mass index (BMI) is a measurement that can be used to identify possible weight problems. It estimates body fat based on height and weight. Your health care provider can help determine your BMI and help you achieve or maintain a healthy weight.  For females 17 years of age and older:   A BMI below 18.5 is considered underweight.  A BMI of 18.5 to 24.9 is normal.  A BMI of 25 to 29.9 is considered  overweight.  A BMI of 30 and above is considered obese.  Watch levels of cholesterol and blood lipids  You should start having your blood tested for lipids and cholesterol at 77 years of age, then have this test every 5 years.  You may need to have your cholesterol levels checked more often if:  Your lipid or cholesterol levels are high.  You are older than 77 years of age.  You are at high risk for heart disease.  CANCER SCREENING   Lung Cancer  Lung cancer screening is recommended for adults 4-63 years old who are at high risk for lung cancer because of a history of smoking.  A yearly low-dose CT scan of the lungs is recommended for people who:  Currently smoke.  Have quit within the past 15 years.  Have at least a 30-pack-year history of smoking. A pack year is smoking an average of one pack of cigarettes a day for 1 year.  Yearly screening should continue until it has been 15 years since you quit.  Yearly screening should stop if you develop a health problem that would prevent you from having lung cancer treatment.  Breast Cancer  Practice breast self-awareness. This means understanding how your breasts normally appear and feel.  It also means doing regular breast self-exams. Let your health care provider know about any changes, no matter how small.  If you are in your 20s or 30s, you should have a clinical breast exam (CBE) by a health care provider every 1-3 years as part of a regular health exam.  If you are 9 or older, have a CBE every year. Also consider having a breast X-ray (mammogram) every year.  If you have a family history of breast cancer, talk to your  health care provider about genetic screening.  If you are at high risk for breast cancer, talk to your health care provider about having an MRI and a mammogram every year.  Breast cancer gene (BRCA) assessment is recommended for women who have family members with BRCA-related cancers. BRCA-related  cancers include:  Breast.  Ovarian.  Tubal.  Peritoneal cancers.  Results of the assessment will determine the need for genetic counseling and BRCA1 and BRCA2 testing. Cervical Cancer Your health care provider may recommend that you be screened regularly for cancer of the pelvic organs (ovaries, uterus, and vagina). This screening involves a pelvic examination, including checking for microscopic changes to the surface of your cervix (Pap test). You may be encouraged to have this screening done every 3 years, beginning at age 64.  For women ages 22-65, health care providers may recommend pelvic exams and Pap testing every 3 years, or they may recommend the Pap and pelvic exam, combined with testing for human papilloma virus (HPV), every 5 years. Some types of HPV increase your risk of cervical cancer. Testing for HPV may also be done on women of any age with unclear Pap test results.  Other health care providers may not recommend any screening for nonpregnant women who are considered low risk for pelvic cancer and who do not have symptoms. Ask your health care provider if a screening pelvic exam is right for you.  If you have had past treatment for cervical cancer or a condition that could lead to cancer, you need Pap tests and screening for cancer for at least 20 years after your treatment. If Pap tests have been discontinued, your risk factors (such as having a new sexual partner) need to be reassessed to determine if screening should resume. Some women have medical problems that increase the chance of getting cervical cancer. In these cases, your health care provider may recommend more frequent screening and Pap tests. Colorectal Cancer  This type of cancer can be detected and often prevented.  Routine colorectal cancer screening usually begins at 77 years of age and continues through 77 years of age.  Your health care provider may recommend screening at an earlier age if you have risk  factors for colon cancer.  Your health care provider may also recommend using home test kits to check for hidden blood in the stool.  A small camera at the end of a tube can be used to examine your colon directly (sigmoidoscopy or colonoscopy). This is done to check for the earliest forms of colorectal cancer.  Routine screening usually begins at age 61.  Direct examination of the colon should be repeated every 5-10 years through 77 years of age. However, you may need to be screened more often if early forms of precancerous polyps or small growths are found. Skin Cancer  Check your skin from head to toe regularly.  Tell your health care provider about any new moles or changes in moles, especially if there is a change in a mole's shape or color.  Also tell your health care provider if you have a mole that is larger than the size of a pencil eraser.  Always use sunscreen. Apply sunscreen liberally and repeatedly throughout the day.  Protect yourself by wearing long sleeves, pants, a wide-brimmed hat, and sunglasses whenever you are outside. HEART DISEASE, DIABETES, AND HIGH BLOOD PRESSURE   High blood pressure causes heart disease and increases the risk of stroke. High blood pressure is more likely to develop in:  People who have blood pressure in the high end of the normal range (130-139/85-89 mm Hg).  People who are overweight or obese.  People who are African American.  If you are 67-56 years of age, have your blood pressure checked every 3-5 years. If you are 21 years of age or older, have your blood pressure checked every year. You should have your blood pressure measured twice--once when you are at a hospital or clinic, and once when you are not at a hospital or clinic. Record the average of the two measurements. To check your blood pressure when you are not at a hospital or clinic, you can use:  An automated blood pressure machine at a pharmacy.  A home blood pressure  monitor.  If you are between 73 years and 28 years old, ask your health care provider if you should take aspirin to prevent strokes.  Have regular diabetes screenings. This involves taking a blood sample to check your fasting blood sugar level.  If you are at a normal weight and have a low risk for diabetes, have this test once every three years after 77 years of age.  If you are overweight and have a high risk for diabetes, consider being tested at a younger age or more often. PREVENTING INFECTION  Hepatitis B  If you have a higher risk for hepatitis B, you should be screened for this virus. You are considered at high risk for hepatitis B if:  You were born in a country where hepatitis B is common. Ask your health care provider which countries are considered high risk.  Your parents were born in a high-risk country, and you have not been immunized against hepatitis B (hepatitis B vaccine).  You have HIV or AIDS.  You use needles to inject street drugs.  You live with someone who has hepatitis B.  You have had sex with someone who has hepatitis B.  You get hemodialysis treatment.  You take certain medicines for conditions, including cancer, organ transplantation, and autoimmune conditions. Hepatitis C  Blood testing is recommended for:  Everyone born from [birth year] through 1967.  Anyone with known risk factors for hepatitis C. Sexually transmitted infections (STIs)  You should be screened for sexually transmitted infections (STIs) including gonorrhea and chlamydia if:  You are sexually active and are younger than 77 years of age.  You are older than 78 years of age and your health care provider tells you that you are at risk for this type of infection.  Your sexual activity has changed since you were last screened and you are at an increased risk for chlamydia or gonorrhea. Ask your health care provider if you are at risk.  If you do not have HIV, but are at risk, it may be  recommended that you take a prescription medicine daily to prevent HIV infection. This is called pre-exposure prophylaxis (PrEP). You are considered at risk if:  You are sexually active and do not regularly use condoms or know the HIV status of your partner(s).  You take drugs by injection.  You are sexually active with a partner who has HIV. Talk with your health care provider about whether you are at high risk of being infected with HIV. If you choose to begin PrEP, you should first be tested for HIV. You should then be tested every 3 months for as long as you are taking PrEP.  PREGNANCY   If you are premenopausal and you may become pregnant, ask your health  care provider about preconception counseling.  If you may become pregnant, take 400 to 800 micrograms (mcg) of folic acid every day.  If you want to prevent pregnancy, talk to your health care provider about birth control (contraception). OSTEOPOROSIS AND MENOPAUSE   Osteoporosis is a disease in which the bones lose minerals and strength with aging. This can result in serious bone fractures. Your risk for osteoporosis can be identified using a bone density scan.  If you are 75 years of age or older, or if you are at risk for osteoporosis and fractures, ask your health care provider if you should be screened.  Ask your health care provider whether you should take a calcium or vitamin D supplement to lower your risk for osteoporosis.  Menopause may have certain physical symptoms and risks.  Hormone replacement therapy may reduce some of these symptoms and risks. Talk to your health care provider about whether hormone replacement therapy is right for you.  HOME CARE INSTRUCTIONS   Schedule regular health, dental, and eye exams.  Stay current with your immunizations.   Do not use any tobacco products including cigarettes, chewing tobacco, or electronic cigarettes.  If you are pregnant, do not drink alcohol.  If you are  breastfeeding, limit how much and how often you drink alcohol.  Limit alcohol intake to no more than 1 drink per day for nonpregnant women. One drink equals 12 ounces of beer, 5 ounces of wine, or 1 ounces of hard liquor.  Do not use street drugs.  Do not share needles.  Ask your health care provider for help if you need support or information about quitting drugs.  Tell your health care provider if you often feel depressed.  Tell your health care provider if you have ever been abused or do not feel safe at home.   This information is not intended to replace advice given to you by your health care provider. Make sure you discuss any questions you have with your health care provider.   Document Released: 09/10/2010 Document Revised: 03/18/2014 Document Reviewed: 01/27/2013 Elsevier Interactive Patient Education [employer].

## 2015-11-14 ENCOUNTER — Telehealth: Payer: Self-pay | Admitting: Family Medicine

## 2015-11-14 NOTE — Telephone Encounter (Signed)
$  286 for 3 months. Discussed coumadin option- she declines. She will refill and then discuss long term management with oncology when she sees them mid october

## 2015-11-14 NOTE — Telephone Encounter (Signed)
° °  Pt said she is in the doughnut hole with the below med and is asking if there is alternative.    rivaroxaban (XARELTO) 20 MG TABS tablet

## 2015-11-28 ENCOUNTER — Other Ambulatory Visit (INDEPENDENT_AMBULATORY_CARE_PROVIDER_SITE_OTHER): Payer: Medicare Other

## 2015-11-28 DIAGNOSIS — E785 Hyperlipidemia, unspecified: Secondary | ICD-10-CM

## 2015-11-28 DIAGNOSIS — H04123 Dry eye syndrome of bilateral lacrimal glands: Secondary | ICD-10-CM | POA: Diagnosis not present

## 2015-11-28 DIAGNOSIS — H26493 Other secondary cataract, bilateral: Secondary | ICD-10-CM | POA: Diagnosis not present

## 2015-11-28 DIAGNOSIS — Z961 Presence of intraocular lens: Secondary | ICD-10-CM | POA: Diagnosis not present

## 2015-11-28 LAB — CBC
HCT: 36.2 % (ref 36.0–46.0)
HEMOGLOBIN: 12.8 g/dL (ref 12.0–15.0)
MCHC: 35.2 g/dL (ref 30.0–36.0)
MCV: 88.9 fl (ref 78.0–100.0)
Platelets: 204 10*3/uL (ref 150.0–400.0)
RBC: 4.07 Mil/uL (ref 3.87–5.11)
RDW: 13.4 % (ref 11.5–15.5)
WBC: 4.4 10*3/uL (ref 4.0–10.5)

## 2015-11-28 LAB — COMPREHENSIVE METABOLIC PANEL
ALK PHOS: 64 U/L (ref 39–117)
ALT: 17 U/L (ref 0–35)
AST: 19 U/L (ref 0–37)
Albumin: 4.4 g/dL (ref 3.5–5.2)
BILIRUBIN TOTAL: 0.5 mg/dL (ref 0.2–1.2)
BUN: 18 mg/dL (ref 6–23)
CO2: 32 mEq/L (ref 19–32)
Calcium: 9.5 mg/dL (ref 8.4–10.5)
Chloride: 102 mEq/L (ref 96–112)
Creatinine, Ser: 1.03 mg/dL (ref 0.40–1.20)
GFR: 55.22 mL/min — ABNORMAL LOW (ref >60.00)
GLUCOSE: 93 mg/dL (ref 70–99)
Potassium: 4.2 mEq/L (ref 3.5–5.1)
SODIUM: 140 meq/L (ref 135–145)
TOTAL PROTEIN: 7.6 g/dL (ref 6.0–8.3)

## 2015-11-28 LAB — LIPID PANEL
Cholesterol: 278 mg/dL — ABNORMAL HIGH (ref 0–200)
HDL: 36.6 mg/dL — ABNORMAL LOW (ref >39.00)
NONHDL: 241.7
Total CHOL/HDL Ratio: 8
Triglycerides: 270 mg/dL — ABNORMAL HIGH (ref 0.0–149.0)
VLDL: 54 mg/dL — ABNORMAL HIGH (ref 0.0–40.0)

## 2015-11-28 LAB — LDL CHOLESTEROL, DIRECT: LDL DIRECT: 186 mg/dL

## 2015-11-28 LAB — TSH: TSH: 2.44 u[IU]/mL (ref 0.35–4.50)

## 2015-12-05 ENCOUNTER — Ambulatory Visit (INDEPENDENT_AMBULATORY_CARE_PROVIDER_SITE_OTHER): Payer: Medicare Other | Admitting: Family Medicine

## 2015-12-05 ENCOUNTER — Encounter: Payer: Self-pay | Admitting: Family Medicine

## 2015-12-05 VITALS — BP 128/78 | HR 80 | Wt 144.4 lb

## 2015-12-05 DIAGNOSIS — M8580 Other specified disorders of bone density and structure, unspecified site: Secondary | ICD-10-CM

## 2015-12-05 DIAGNOSIS — Z23 Encounter for immunization: Secondary | ICD-10-CM | POA: Diagnosis not present

## 2015-12-05 DIAGNOSIS — I10 Essential (primary) hypertension: Secondary | ICD-10-CM

## 2015-12-05 DIAGNOSIS — E785 Hyperlipidemia, unspecified: Secondary | ICD-10-CM | POA: Diagnosis not present

## 2015-12-05 DIAGNOSIS — I82591 Chronic embolism and thrombosis of other specified deep vein of right lower extremity: Secondary | ICD-10-CM | POA: Diagnosis not present

## 2015-12-05 MED ORDER — ATORVASTATIN CALCIUM 10 MG PO TABS: 10.0000 mg | ORAL_TABLET | Freq: Every day | ORAL | 3 refills | Status: DC

## 2015-12-05 NOTE — Assessment & Plan Note (Addendum)
S: localized swelling right lower leg medial side. Off xarelto for 3 weeks. $315 for 3 months. 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  O: area about 2 x 2 cm and much less prominent and not tender to touch A/P: appears to be improving on xarelto- continue for now. Ask Dr. 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 3 weeks of sample given. If cost continues to be issue- may have to do coumadin.

## 2015-12-05 NOTE — Progress Notes (Signed)
Subjective:  Laura Li is a 75 y.o. year old very pleasant female patient who presents for/with See problem oriented charting ROS- knee and hip pain on left big increase since starting fosamax. No chest pain or shortness of breath. No headache or blurry vision. .see any ROS included in HPI as well.   Past Medical History-  Patient Active Problem List   Diagnosis Date Noted  . Chronic deep vein thrombosis (DVT) (HCC) 09/01/2015    Priority: High  . Localized swelling, mass, or lump of lower extremity 11/05/2014    Priority: High  . Pneumothorax 06/15/2014    Priority: High  . Breast cancer of lower-outer quadrant of left female breast ([place]) 04/24/2014    Priority: High  . Vasculitis ([place]) 11/09/2012    Priority: High  . Melanoma of skin ([place]) 03/31/2014    Priority: Medium  . Chronic kidney disease, stage III (moderate) 12/09/2012    Priority: Medium  . 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    Priority: Medium  . DVT, HX OF 12/30/2006    Priority: Medium  . Hypothyroidism 02/06/2006    Priority: Medium  . Hyperlipidemia 02/06/2006    Priority: Medium  . Essential hypertension 02/06/2006    Priority: Medium  . GERD (gastroesophageal reflux disease) 03/31/2014    Priority: Low  . Hot flashes 03/31/2014    Priority: Low  . Osteoarthritis 03/31/2014    Priority: Low  . Allergic rhinitis 02/06/2006    Priority: Low  . Osteopenia 02/06/2006    Priority: Low  . Excessive tearing 09/21/2014  . Antineoplastic chemotherapy induced anemia 08/17/2014  . Diverticulitis 08/09/2014  . Bilateral breast cancer ([place]) 04/29/2014  . Family history of breast cancer 04/29/2014  . Breast cancer, right breast ([place]) 04/24/2014    Medications- reviewed and updated Current Outpatient Prescriptions  Medication Sig Dispense Refill  . acetaminophen (TYLENOL) 500 MG tablet Take 500 mg by mouth 2 (two) times daily as needed for mild pain. Reported on 07/07/2015    . B Complex-C-E-Zn (B  COMPLEX-C-E-ZINC) tablet Take 1 tablet by mouth daily.    . Calcium-Vitamin D (CALTRATE 600 PLUS-VIT D PO) Take 1 tablet by mouth 2 (two) times daily.     [name] esomeprazole (NEXIUM) 40 MG capsule TAKE 1 BY MOUTH DAILY 90 capsule 2  . fexofenadine (ALLEGRA) 180 MG tablet Take 180 mg by mouth daily as needed for allergies.     . hypromellose (SYSTANE OVERNIGHT THERAPY) 0.3 % GEL ophthalmic ointment Place 1 application into both eyes at bedtime as needed for dry eyes.    [name] ipratropium (ATROVENT) 0.03 % nasal spray Place 2 sprays into the nose every 12 (twelve) hours. 180 mL 3  . Lactobacillus-Inulin (CULTURELLE DIGESTIVE HEALTH PO) Take 1 capsule by mouth daily.    [name] levothyroxine (SYNTHROID, LEVOTHROID) 75 MCG tablet Take 1 tablet (75 mcg total) by mouth daily. 90 tablet 2  . nadolol (CORGARD) 20 MG tablet Take 0.5 tablets (10 mg total) by mouth daily. 90 tablet 1  . Propylene Glycol (SYSTANE BALANCE) 0.6 % SOLN Apply to eye as needed.    . rivaroxaban (XARELTO) 20 MG TABS tablet Take 1 tablet (20 mg total) by mouth daily with supper. 90 tablet 3  . venlafaxine XR (EFFEXOR-XR) 150 MG 24 hr capsule Take 1 capsule (150 mg total) by mouth daily. 90 capsule 3  . atorvastatin (LIPITOR) 10 MG tablet Take 1 tablet (10 mg total) by mouth daily. 90 tablet 3   No current facility-administered  medications for this visit.     Objective: BP 128/78 (BP Location: Left Arm, Patient Position: Sitting, Cuff Size: Normal)   Pulse 80   Wt 144 lb 6.4 oz (65.5 kg)   SpO2 93%   BMI 23.31 kg/m  Gen: NAD, resting comfortably CV: RRR no murmurs rubs or gallops Lungs: CTAB no crackles, wheeze, rhonchi Abdomen: soft/nontender/nondistended/normal bowel sounds.  Ext: no edema, also see below Skin: warm, dry Neuro: grossly normal, moves all extremities  Assessment/Plan:  Osteopenia S: fragility fracture this year with going from ground to standing up. On calcium vitamin D and taking fosamax. Prior fosamax finished  around 2001. Restarted 08/2015 but since started she has had intense knee and hip pain and noted it starting right as she started medicine A/P: trial off fosamax- if does not resolve issues then we will restart  Chronic deep vein thrombosis (DVT) (HCC) S: localized swelling right lower leg medial side. Off xarelto for 3 weeks. $315 for 3 months. 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  O: area about 2 x 2 cm and much less prominent and not tender to touch A/P: appears to be improving on xarelto- continue for now. Ask Dr. 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 3 weeks of sample given. If cost continues to be issue- may have to do coumadin.   Hyperlipidemia S: we took her off statin last visit with her concern of memory issues- very poorly controlled with LDL >160. MMSE a month later was 28/30. She noted no improvement in memory on this.  A/P: restart atorvastatin 10mg - consider repeat direct LDL at follow up may need 20mg . She asks about b12- low risk to take so advised b12 1mg  daily OTC  Hypertension S: controlled on 1/2 tablet nadolol 20mg  BP Readings from Last 3 Encounters:  12/05/15 128/78  09/01/15 126/72  07/07/15 130/72  A/P: no changes  Return in about 4 months (around 04/05/2016).  Orders Placed This Encounter  Procedures  . Flu vaccine HIGH DOSE PF    Meds ordered this encounter  Medications  . atorvastatin (LIPITOR) 10 MG tablet    Sig: Take 1 tablet (10 mg total) by mouth daily.    Dispense:  90 tablet    Refill:  3    Return precautions advised.  [name], MD

## 2015-12-05 NOTE — Assessment & Plan Note (Signed)
S: fragility fracture this year with going from ground to standing up. On calcium vitamin D and taking fosamax. Prior fosamax finished around 2001. Restarted 08/2015 but since started she has had intense knee and hip pain and noted it starting right as she started medicine A/P: trial off fosamax- if does not resolve issues then we will restart

## 2015-12-05 NOTE — Progress Notes (Signed)
Pre visit review using our clinic review tool, if applicable. No additional management support is needed unless otherwise documented below in the visit note. 

## 2015-12-05 NOTE — Patient Instructions (Addendum)
Let us know before running out of xarelto- message me. See what Dr. Jana Hakim thinks about long term use of xarelto.   Stop fosamax due to joint pain. if symptoms of knee and hip pain do not improve off medicine- we will restart  Start lipitor back

## 2015-12-05 NOTE — Assessment & Plan Note (Signed)
S: we took her off statin last visit with her concern of memory issues- very poorly controlled with LDL >160. MMSE a month later was 28/30. She noted no improvement in memory on this.  A/P: restart atorvastatin 10mg - consider repeat direct LDL at follow up may need 20mg . She asks about b12- low risk to take so advised b12 1mg  daily OTC

## 2015-12-12 ENCOUNTER — Other Ambulatory Visit: Payer: Self-pay

## 2015-12-12 DIAGNOSIS — I10 Essential (primary) hypertension: Secondary | ICD-10-CM

## 2015-12-12 MED ORDER — LEVOTHYROXINE SODIUM 75 MCG PO TABS: 75.0000 ug | ORAL_TABLET | Freq: Every day | ORAL | 2 refills | Status: DC

## 2015-12-12 MED ORDER — NADOLOL 20 MG PO TABS
10.0000 mg | ORAL_TABLET | Freq: Every day | ORAL | 1 refills | Status: DC
Start: 2015-12-12 — End: 2016-04-05

## 2015-12-14 ENCOUNTER — Other Ambulatory Visit: Payer: Self-pay

## 2015-12-14 DIAGNOSIS — N952 Postmenopausal atrophic vaginitis: Secondary | ICD-10-CM | POA: Diagnosis not present

## 2015-12-14 DIAGNOSIS — F418 Other specified anxiety disorders: Secondary | ICD-10-CM

## 2015-12-14 DIAGNOSIS — N760 Acute vaginitis: Secondary | ICD-10-CM | POA: Diagnosis not present

## 2015-12-14 MED ORDER — VENLAFAXINE HCL ER 150 MG PO CP24: 150.0000 mg | ORAL_CAPSULE | Freq: Every day | ORAL | 3 refills | Status: DC

## 2015-12-14 MED ORDER — ESOMEPRAZOLE MAGNESIUM 40 MG PO CPDR: DELAYED_RELEASE_CAPSULE | ORAL | 2 refills | Status: DC

## 2015-12-25 ENCOUNTER — Other Ambulatory Visit: Payer: Self-pay

## 2015-12-25 DIAGNOSIS — C50512 Malignant neoplasm of lower-outer quadrant of left female breast: Secondary | ICD-10-CM

## 2015-12-26 ENCOUNTER — Ambulatory Visit (HOSPITAL_BASED_OUTPATIENT_CLINIC_OR_DEPARTMENT_OTHER): Payer: Medicare Other | Admitting: Oncology

## 2015-12-26 ENCOUNTER — Other Ambulatory Visit (HOSPITAL_BASED_OUTPATIENT_CLINIC_OR_DEPARTMENT_OTHER): Payer: Medicare Other

## 2015-12-26 ENCOUNTER — Encounter: Payer: Self-pay | Admitting: Oncology

## 2015-12-26 VITALS — BP 123/53 | HR 72 | Temp 97.5°F | Resp 18 | Ht 66.0 in | Wt 142.5 lb

## 2015-12-26 DIAGNOSIS — Z171 Estrogen receptor negative status [ER-]: Secondary | ICD-10-CM

## 2015-12-26 DIAGNOSIS — C50512 Malignant neoplasm of lower-outer quadrant of left female breast: Secondary | ICD-10-CM | POA: Diagnosis not present

## 2015-12-26 DIAGNOSIS — C50911 Malignant neoplasm of unspecified site of right female breast: Secondary | ICD-10-CM

## 2015-12-26 DIAGNOSIS — Z170 Estrogen receptor positive status [ER+]: Secondary | ICD-10-CM | POA: Diagnosis not present

## 2015-12-26 LAB — CBC WITH DIFFERENTIAL/PLATELET
BASO%: 0.7 % (ref 0.0–2.0)
BASOS ABS: 0 10*3/uL (ref 0.0–0.1)
EOS ABS: 0.2 10*3/uL (ref 0.0–0.5)
EOS%: 2.6 % (ref 0.0–7.0)
HCT: 38.7 % (ref 34.8–46.6)
HGB: 12.9 g/dL (ref 11.6–15.9)
LYMPH%: 19.5 % (ref 14.0–49.7)
MCH: 30.4 pg (ref 25.1–34.0)
MCHC: 33.3 g/dL (ref 31.5–36.0)
MCV: 91.5 fL (ref 79.5–101.0)
MONO#: 0.4 10*3/uL (ref 0.1–0.9)
MONO%: 7 % (ref 0.0–14.0)
NEUT%: 70.2 % (ref 38.4–76.8)
NEUTROS ABS: 4.2 10*3/uL (ref 1.5–6.5)
PLATELETS: 216 10*3/uL (ref 145–400)
RBC: 4.23 10*6/uL (ref 3.70–5.45)
RDW: 13.7 % (ref 11.2–14.5)
WBC: 5.9 10*3/uL (ref 3.9–10.3)
lymph#: 1.2 10*3/uL (ref 0.9–3.3)

## 2015-12-26 LAB — COMPREHENSIVE METABOLIC PANEL
ALT: 15 U/L (ref 0–55)
ANION GAP: 10 meq/L (ref 3–11)
AST: 20 U/L (ref 5–34)
Albumin: 4.2 g/dL (ref 3.5–5.0)
Alkaline Phosphatase: 79 U/L (ref 40–150)
BILIRUBIN TOTAL: 0.47 mg/dL (ref 0.20–1.20)
BUN: 18.7 mg/dL (ref 7.0–26.0)
CO2: 27 meq/L (ref 22–29)
Calcium: 10.3 mg/dL (ref 8.4–10.4)
Chloride: 106 mEq/L (ref 98–109)
Creatinine: 1.1 mg/dL (ref 0.6–1.1)
EGFR: 47 mL/min/{1.73_m2} — AB (ref >90)
Glucose: 94 mg/dl (ref 70–140)
POTASSIUM: 4.6 meq/L (ref 3.5–5.1)
SODIUM: 142 meq/L (ref 136–145)
TOTAL PROTEIN: 8 g/dL (ref 6.4–8.3)

## 2015-12-26 NOTE — Progress Notes (Signed)
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 Non-of the 25 lymph nodes removed from the right axilla were involved. The tumor was estrogen and progesterone receptor positive, HER-2 not amplified. She was treated adjuvantly with doxorubicin and cyclophosphamide 4, then received 5 years of antiestrogen therapy (initially tamoxifen, later letrozole). There has been no evidence of disease recurrence on the right.  More recently, Laura Li had routine left screening mammography [facility] March 08 2014. This showed a suspicious area of architectural distortion in the inferior portion of the left breast. On 81/44/8185 Laura Li underwent left diagnostic mammography with ultrasonography. Spot compression views confirmed the presence 7 area of distortion in the lower central left breast, which was not palpable by physical exam (there was a prior biopsy scar in the upper outer quadrant of the left breast). Ultrasound confirmed a hypoechoic spiculated mass at the 6:00 location in the left breast, measuring maximally 1.3 cm. Sonography of the left axilla was negative.  Biopsy of the  left breast mass in question 120 10/29/2014 showed ([id number]) an invasive breast cancer with squamous features. It was estrogen and progesterone receptor negative. There was no HER-2 amplification, the signals ratio being 0.82 and the number per cell 1.80. The MIB-1 was 10%.  The patient has met with Dr. Donne Hazel in surgery and Dr. Pablo Ledger in radiation oncology. The patient understands there is no survival difference between lumpectomy and radiation compared with mastectomy. The patient will need sentinel lymph node sampling with either of those procedures. If she does have a lumpectomy, she will benefit from adjuvant radiation. The patient has been set up for genetics testing and this may affect her ultimate surgical decision.  Her subsequent history is as detailed below  INTERVAL HISTORY: Laura Li returns today for follow-up of her estrogen receptor negative breast cancer accompanied by her daughter. She is generally doing well, and contributed one of her pain thinks to the [facility]. She now has a Clinical research associate, and goes to the gym regularly.  REVIEW OF SYSTEMS: On the negative side she fell while walking in the [place] and twisted her right foot. She wanted me to look at that today. She was found to have significant osteopenia, and was started on Fosamax. She says this made all her bones hurt and even her left breast heard. She has stopped it. She wonders if she needs to be on Xarelto long term. Aside from these issues a detailed review of systems today was stable.  PAST MEDICAL HISTORY: Past Medical History:  Diagnosis Date  . Anemia    from medicaions  . Arthritis   . Chronic kidney disease    some renal impairment from meds  . Depression    states no depression  . Eczema   . Esophageal stricture   .  GERD (gastroesophageal reflux disease)   . Headache   . Heart murmur    states it is benign told 30-40 years ago  . History of DVT (deep vein thrombosis)   . HX: breast cancer     melanoma  . Hyperlipidemia   . Hypertension   . Hypothyroidism   . Melanoma ([place])   . OA (osteoarthritis)   . Pneumothorax on right 06/15/2014  . PONV (postoperative nausea and vomiting)   . Wears glasses     PAST SURGICAL HISTORY: Past Surgical History:  Procedure Laterality Date  . APPENDECTOMY  1967  . BREAST LUMPECTOMY     left breast-benign  . BREAST SURGERY  1977   removal of calcified milk gland right breast  . CHEST TUBE INSERTION  06/15/2014  . CHEST TUBE INSERTION Bilateral 06/15/2014   Procedure: CHEST TUBE INSERTION;  Surgeon: Rolm Bookbinder, MD;  Location: [facility];  Service: General;  Laterality: Bilateral;  . COLONOSCOPY    . LIPOMA EXCISION  1978   right breast  . MASTECTOMY  1998   right-nodes out  . MELANOMA EXCISION  2004   right side of face  . MOHS SURGERY  2005   right left-basal cell  . PORT-A-CATH REMOVAL N/A 10/05/2014   Procedure: REMOVAL PORT-A-CATH;  Surgeon: Rolm Bookbinder, MD;  Location: [facility];  Service: General;  Laterality: N/A;  . PORTACATH PLACEMENT Left 06/15/2014   Procedure: INSERTION PORT-A-CATH;  Surgeon: Rolm Bookbinder, MD;  Location: [facility];  Service: General;  Laterality: Left;  . RADIOACTIVE SEED GUIDED MASTECTOMY WITH AXILLARY SENTINEL LYMPH NODE BIOPSY Left 05/05/2014   Procedure: RADIOACTIVE SEED GUIDED LEFT LUMPECTOMY WITH AXILLARY SENTINEL LYMPH NODE BIOPSY;  Surgeon: Rolm Bookbinder, MD;  Location: [facility];  Service: General;  Laterality: Left;  . TONSILLECTOMY AND ADENOIDECTOMY  1949  . TOTAL ABDOMINAL HYSTERECTOMY  1992   including ovaries    FAMILY HISTORY Family History  Problem Relation Age of Onset  . Heart disease Father 22    smoker  . Endometrial cancer Mother 11  . Breast cancer Cousin     x 3 cousins with breast cancer (one at 39, one at 81 and one at 41)  . Lung cancer Paternal Aunt     smoker  . Brain cancer Maternal Aunt   . Diabetes Maternal Aunt     ???  .  Breast cancer Maternal Aunt 51  . Colon cancer Neg Hx   The patient's father died at the age of 42 from a myocardial infarction. The patient's mother died at the age of 50 from metastatic endometrial cancer which had been diagnosed 2 years before. The patient had no brothers, one sister. The patient's mother's sister was diagnosed with breast cancer but the patient does not know at what age. A second maternal aunt was diagnosed with brain cancer. The patient has 3 cousins on her mother's side diagnosed with breast cancer, one of them under the age of 51. On the father's side there is a history of lung and cervical cancer  GYNECOLOGIC HISTORY:  No LMP recorded. Patient has had a hysterectomy. Menarche age 57, first live birth age 64. The patient is GX P1. She underwent hysterectomy with bilateral salpingo-oophorectomy in 1991. She used hormone replacement for approximately 7 years until 1998, when she had her right-sided breast cancer. She used oral contraceptives for approximately one year with no complications. 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 She is widowed and lives alone with no pets. Her main hobby is painting. Her daughter Laura Li lives in [place] and is also a homemaker. The patient has 2 grandchildren. She attends wholly [place]. The patient's daughter [name] is her healthcare power of attorney. [name] can be reached at [phone]   HEALTH MAINTENANCE: Social History  Substance Use Topics  . Smoking status: Former Smoker    Packs/day: 0.75    Years: 3.00    Types: Cigarettes    Quit date: 03/11/1969  . Smokeless tobacco: Never Used  . Alcohol use 0.0 oz/week     Comment: occasional wine     Colonoscopy: 20/10/[facility]  TGG:YIRSWN post hysterectomy  Bone  density:2014/osteopenia  Lipid panel:  Allergies  Allergen Reactions  . Dyazide [Hydrochlorothiazide W-Triamterene]     Presumption: drug induced vasculitis  . Niacin Other (See Comments)    flushing  . Penicillins Diarrhea and Nausea And Vomiting    Current Outpatient Prescriptions  Medication Sig Dispense Refill  . acetaminophen (TYLENOL) 500 MG tablet Take 500 mg by mouth 2 (two) times daily as needed for mild pain. Reported on 07/07/2015    . atorvastatin (LIPITOR) 10 MG tablet Take 1 tablet (10 mg total) by mouth daily. 90 tablet 3  . B Complex-C-E-Zn (B COMPLEX-C-E-ZINC) tablet Take 1 tablet by mouth daily.    . Calcium-Vitamin D (CALTRATE 600 PLUS-VIT D PO) Take 1 tablet by mouth 2 (two) times daily.     [name] esomeprazole (NEXIUM) 40 MG capsule TAKE 1 BY MOUTH DAILY 90 capsule 2  . fexofenadine (ALLEGRA) 180 MG tablet Take 180 mg by mouth daily as needed for allergies.     . hypromellose (SYSTANE OVERNIGHT THERAPY) 0.3 % GEL ophthalmic ointment Place 1 application into both eyes at bedtime as needed for dry eyes.    [name] ipratropium (ATROVENT) 0.03 % nasal spray Place 2 sprays into the nose every 12 (twelve) hours. 180 mL 3  . Lactobacillus-Inulin (CULTURELLE DIGESTIVE HEALTH PO) Take 1 capsule by mouth daily.    [name] levothyroxine (SYNTHROID, LEVOTHROID) 75 MCG tablet Take 1 tablet (75 mcg total) by mouth daily. 90 tablet 2  . nadolol (CORGARD) 20 MG tablet Take 0.5 tablets (10 mg total) by mouth daily. 90 tablet 1  . Propylene Glycol (SYSTANE BALANCE) 0.6 % SOLN Apply to eye as needed.    . rivaroxaban (XARELTO) 20 MG TABS tablet Take 1 tablet (20 mg total) by mouth daily with supper. 90 tablet 3  . venlafaxine XR (EFFEXOR-XR) 150 MG 24 hr capsule Take 1 capsule (150 mg total) by mouth daily. 90 capsule 3   No current facility-administered medications for this visit.     OBJECTIVE: Older white woman Who appears well Vitals:   12/26/15 0957  BP: (!) 123/53  Pulse: 72  Resp: 18    Temp: 97.5 F (36.4 C)     Body mass index is 23 kg/m.    ECOG FS:1 - Symptomatic but completely ambulatory  Sclerae unicteric, EOMs intact Oropharynx clear and moist No cervical or supraclavicular adenopathy Lungs no rales or rhonchi Heart regular rate and rhythm Abd soft, nontender, positive bowel sounds MSK no focal spinal tenderness, no upper extremity lymphedema; right foot and ankle show no swelling or evidence of fracture, no ecchymosis Neuro: nonfocal, well oriented, appropriate affect Breasts:  The right breast is status post mastectomy with no evidence of local recurrence. Right axilla is benign. The left breast is status post lumpectomy and radiation, with no evidence of local recurrence. Left axilla is benign.  LAB RESULTS:  CMP     Component Value Date/Time   NA 142 12/26/2015 0940   K 4.6 12/26/2015 0940   CL 102 11/28/2015 0849   CO2 27 12/26/2015 0940   GLUCOSE 94 12/26/2015 0940   GLUCOSE 88 02/06/2006 1059   BUN 18.7 12/26/2015 0940   CREATININE 1.1 12/26/2015 0940   CALCIUM 10.3 12/26/2015 0940   PROT 8.0 12/26/2015 0940   ALBUMIN 4.2 12/26/2015 0940   AST 20 12/26/2015 0940   ALT 15 12/26/2015 0940   ALKPHOS 79 12/26/2015 0940   BILITOT 0.47 12/26/2015 0940   GFRNONAA 56 (L) 06/14/2014 1025   GFRAA 65 (L) 06/14/2014 1025    INo results found for: SPEP, UPEP  Lab Results  Component Value Date   WBC 5.9 12/26/2015   NEUTROABS 4.2 12/26/2015   HGB 12.9 12/26/2015   HCT 38.7 12/26/2015   MCV 91.5 12/26/2015   PLT 216 12/26/2015      Chemistry      Component Value Date/Time   NA 142 12/26/2015 0940   K 4.6 12/26/2015 0940   CL 102 11/28/2015 0849   CO2 27 12/26/2015 0940   BUN 18.7 12/26/2015 0940   CREATININE 1.1 12/26/2015 0940      Component Value Date/Time   CALCIUM 10.3 12/26/2015 0940   ALKPHOS 79 12/26/2015 0940   AST 20 12/26/2015 0940   ALT 15 12/26/2015 0940   BILITOT 0.47 12/26/2015 0940       No results found for:  LABCA2  No components found for: LABCA125  No results for input(s): INR in the last 168 hours.  Urinalysis    Component Value Date/Time   COLORURINE YELLOW 05/17/2013 1330   APPEARANCEUR CLEAR 05/17/2013 1330   LABSPEC 1.005 08/09/2014 1052   PHURINE 7.0 08/09/2014 1052   PHURINE 5.5 05/17/2013 1330   GLUCOSEU Negative 08/09/2014 1052   HGBUR Negative 08/09/2014 1052   HGBUR NEGATIVE 05/17/2013 1330   BILIRUBINUR Negative 08/09/2014 1052   KETONESUR Negative 08/09/2014 1052   KETONESUR NEGATIVE 05/17/2013 1330   PROTEINUR Negative 08/09/2014 1052   PROTEINUR NEGATIVE 05/17/2013 1330   UROBILINOGEN 0.2 08/09/2014 1052   NITRITE Negative 08/09/2014 1052   NITRITE NEGATIVE 05/17/2013 1330   LEUKOCYTESUR Negative 08/09/2014 1052    STUDIES: No results found.  ASSESSMENT: 75 y.o. [place] woman  (1) status post right modified radical mastectomy November 1998 for a pT2 pN0, stage IIA invasive ductal carcinoma, grade 2, estrogen and progesterone receptor positive, HER-2 not amplified  (a) adjuvant chemotherapy consisted of doxorubicin and cyclophosphamide 4  (b) adjuvant anti-estrogens consisted of tamoxifen, then letrozole, for a total of 5 years  (2) status post left breast lower outer quadrant biopsy 04/07/2014 for a clinical T1c N0,stage IA  invasive ductal carcinoma, with squamous features, estrogen and progesterone receptor negative, HER-2 not amplified, with an MIB-1 of 10%   (3) left lumpectomy and sentinel lymph node biopsy 05/05/2014 showed aT1c pN0, stage IA invasive ductal carcinoma, grade 1, triple negative, with close but negative margins.   (4) Mammaprint classifies the tumor as basal like, high risk, and predicts a 30% chance of recurrence within 10 years with local treatment only.   (5) start of treatment delayed because of bilateral pneumothoraces after port placement. Adjuvant chemotherapy fiinally  started 07/11/2014 consisting of carboplatin and docetaxel  given every 21 days 4, completed 09/13/2014  (6) adjuvant radiation 10/31/2014-12/15/2014:  Left breast/ 45 Gy at 1.8 Gy per fraction x 25 fractions.  Left breast boost/ 16 Gy at 2 Gy per fraction x 8 fractions  (7) genetics testing March 2016 showed no deleterious mutation in the [employer] panel  [[facility]] including sequencing and rearrangement analysis for the following 24 genes:ATM, BARD1, BRCA1, BRCA2, BRIP1, CDH1, CHEK2, EPCAM, MLH1, MRE11A, MSH2, MSH6, MUTYH, NBN, NF1, PALB2, PMS2, PTEN, RAD50, RAD51C, RAD51D, SMARCA4, STK11, and TP53.   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   (8) osteopenia by bone density 06/29/2015 with [place] T score of -2.4  (a) intolerant of Fosamax   PLAN: Raivyn is will soon be 2 years out from definitive diagnosis of her breast cancer, with no evidence of disease recurrence. This is very favorable.  She does have significant osteopenia, near osteoporosis, and she was not able to tolerate Fosamax.  We discussed the data that showed a 2-3% reduction in the risk of breast cancer recurrence in postmenopausal patients who receive zolendronate every 6 months for 2 years.  There are of course many other ways to treat osteopenia/osteoporosis, but this might be a particularly good option for her and I have sent a letter to her doctor without edema.  She also wondered if she should be on Xarelto lifelong. I think in her case it would be prudent.  She will see me again in April of next year. From that point I expect to see her on a once a year basis.  She knows to call for any problems that may develop before that visit  Criselda Starke C, MD   12/26/2015 10:22 AM

## 2016-01-16 ENCOUNTER — Encounter: Payer: Self-pay | Admitting: Family Medicine

## 2016-01-16 ENCOUNTER — Ambulatory Visit (INDEPENDENT_AMBULATORY_CARE_PROVIDER_SITE_OTHER): Payer: Medicare Other | Admitting: Family Medicine

## 2016-01-16 VITALS — BP 130/82 | HR 64 | Temp 97.6°F | Wt 143.8 lb

## 2016-01-16 DIAGNOSIS — R1032 Left lower quadrant pain: Secondary | ICD-10-CM

## 2016-01-16 DIAGNOSIS — M816 Localized osteoporosis [Lequesne]: Secondary | ICD-10-CM

## 2016-01-16 DIAGNOSIS — K5900 Constipation, unspecified: Secondary | ICD-10-CM | POA: Diagnosis not present

## 2016-01-16 NOTE — Progress Notes (Signed)
Subjective:  Laura Li is a 75 y.o. year old very pleasant female patient who presents for/with See problem oriented charting ROS- no diarrhea, vomiting. No fever or chills.see any ROS included in HPI as well.   Past Medical History-  Patient Active Problem List   Diagnosis Date Noted  . Chronic deep vein thrombosis (DVT) (HCC) 09/01/2015    Priority: High  . Localized swelling, mass, or lump of lower extremity 11/05/2014    Priority: High  . Pneumothorax 06/15/2014    Priority: High  . Breast cancer of lower-outer quadrant of left female breast ([place]) 04/24/2014    Priority: High  . Vasculitis ([place]) 11/09/2012    Priority: High  . Melanoma of skin ([place]) 03/31/2014    Priority: Medium  . Chronic kidney disease, stage III (moderate) 12/09/2012    Priority: Medium  . 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    Priority: Medium  . DVT, HX OF 12/30/2006    Priority: Medium  . Hypothyroidism 02/06/2006    Priority: Medium  . Hyperlipidemia 02/06/2006    Priority: Medium  . Essential hypertension 02/06/2006    Priority: Medium  . GERD (gastroesophageal reflux disease) 03/31/2014    Priority: Low  . Hot flashes 03/31/2014    Priority: Low  . Osteoarthritis 03/31/2014    Priority: Low  . Allergic rhinitis 02/06/2006    Priority: Low  . Osteopenia 02/06/2006    Priority: Low  . Excessive tearing 09/21/2014  . Antineoplastic chemotherapy induced anemia 08/17/2014  . Diverticulitis 08/09/2014  . Bilateral breast cancer ([place]) 04/29/2014  . Family history of breast cancer 04/29/2014  . Breast cancer, right breast ([place]) 04/24/2014    Medications- reviewed and updated Current Outpatient Prescriptions  Medication Sig Dispense Refill  . acetaminophen (TYLENOL) 500 MG tablet Take 500 mg by mouth 2 (two) times daily as needed for mild pain. Reported on 07/07/2015    . atorvastatin (LIPITOR) 10 MG tablet Take 1 tablet (10 mg total) by mouth daily. 90 tablet 3  . B Complex-C-E-Zn  (B COMPLEX-C-E-ZINC) tablet Take 1 tablet by mouth daily.    . Calcium-Vitamin D (CALTRATE 600 PLUS-VIT D PO) Take 1 tablet by mouth 2 (two) times daily.     [name] esomeprazole (NEXIUM) 40 MG capsule TAKE 1 BY MOUTH DAILY 90 capsule 2  . fexofenadine (ALLEGRA) 180 MG tablet Take 180 mg by mouth daily as needed for allergies.     . hypromellose (SYSTANE OVERNIGHT THERAPY) 0.3 % GEL ophthalmic ointment Place 1 application into both eyes at bedtime as needed for dry eyes.    [name] ipratropium (ATROVENT) 0.03 % nasal spray Place 2 sprays into the nose every 12 (twelve) hours. 180 mL 3  . Lactobacillus-Inulin (CULTURELLE DIGESTIVE HEALTH PO) Take 1 capsule by mouth daily.    [name] levothyroxine (SYNTHROID, LEVOTHROID) 75 MCG tablet Take 1 tablet (75 mcg total) by mouth daily. 90 tablet 2  . nadolol (CORGARD) 20 MG tablet Take 0.5 tablets (10 mg total) by mouth daily. 90 tablet 1  . Propylene Glycol (SYSTANE BALANCE) 0.6 % SOLN Apply to eye as needed.    . rivaroxaban (XARELTO) 20 MG TABS tablet Take 1 tablet (20 mg total) by mouth daily with supper. 90 tablet 3  . venlafaxine XR (EFFEXOR-XR) 150 MG 24 hr capsule Take 1 capsule (150 mg total) by mouth daily. 90 capsule 3   No current facility-administered medications for this visit.     Objective: BP 130/82 (BP Location: Left Arm, Patient Position:  Sitting, Cuff Size: Normal)   Pulse 64   Temp 97.6 F (36.4 C) (Oral)   Wt 143 lb 12.8 oz (65.2 kg)   SpO2 95%   BMI 23.21 kg/m  Gen: NAD, resting comfortably, well appearing CV: RRR no murmurs rubs or gallops Lungs: CTAB no crackles, wheeze, rhonchi Abdomen: soft/nontender except for mild tenderness with moderate palpation in LLQ/nondistended/normal bowel sounds. No rebound or guarding.  Ext: no edema  Assessment/Plan:  LLQ abdominal pain S: hard small BMs for several weeks at least. Then yesterday started with LLQ pain after eating- persisted through night at moderate level and hard to sleep. This AM had  normal size bowel movement and had immediate relief of abdominal pain. Now no pain at rest, only with deep palpation. Mild nausea yesterday now resolved. No vomiting. No fever or chills. Some fatigue from being up all night A/P: 75 year old with LLQ abdominal pain improved after BM- suspect constipation related. There is some lingering tenderness on exam today and we discussed chance of diverticulitis though I think this chance is low- she will contact me for cipro/flagyl if febrile, worsening pain, lack of relief with continued BMs- for now she will take miralax 1 capful for 3 days then every other day for another 7 days. At that point may transition to colace for maintenance/prevention of constipation.   Osteoporosis S: tried fosamax but had intense joint pain.She has 5 years fosamax previously ending 2000 or 2001.   Had fragility fracture in 2016 before  this was started.  A/P: I had received a message from Dr. Jana Hakim and with prior breast cancer- encouraged consideration of reclast. When she is feeling better from abdominal pain- discussed starting yearly infusions through cancer center. Did not discuss today but do need to discuss before starting the risk of joint pain which could be potentially severe and as in system would have long term effects over a year potentially (Dosing of medicine)  Return precautions advised.  [name], MD

## 2016-01-16 NOTE — Patient Instructions (Addendum)
Let's have you try miralax 1 capful mixed in water for the next 3 days then every other day for a week so 10 days total. After that can use colace a stool softener to help with prevention  Sounds like this is constipation related but if you develop fevers, worsening pain, worsening fatigue despite good sleep- let me know immediately and we can call in cipro/flagyl combination treatment for diverticulitis  Contact me when feeling better and we can get the ball rolling on reclast

## 2016-01-16 NOTE — Progress Notes (Signed)
Pre visit review using our clinic review tool, if applicable. No additional management support is needed unless otherwise documented below in the visit note. 

## 2016-01-16 NOTE — Assessment & Plan Note (Signed)
S: tried fosamax but had intense joint pain.She has 5 years fosamax previously ending 2000 or 2001.   Had fragility fracture in 2016 before  this was started.  A/P: I had received a message from Dr. Jana Hakim and with prior breast cancer- encouraged consideration of reclast. When she is feeling better from abdominal pain- discussed starting yearly infusions through cancer center. Did not discuss today but do need to discuss before starting the risk of joint pain which could be potentially severe and as in system would have long term effects over a year potentially (Dosing of medicine)

## 2016-02-19 DIAGNOSIS — H04123 Dry eye syndrome of bilateral lacrimal glands: Secondary | ICD-10-CM | POA: Diagnosis not present

## 2016-02-24 ENCOUNTER — Other Ambulatory Visit: Payer: Self-pay | Admitting: Nurse Practitioner

## 2016-03-07 DIAGNOSIS — H524 Presbyopia: Secondary | ICD-10-CM | POA: Diagnosis not present

## 2016-03-07 DIAGNOSIS — H26492 Other secondary cataract, left eye: Secondary | ICD-10-CM | POA: Diagnosis not present

## 2016-03-12 ENCOUNTER — Other Ambulatory Visit: Payer: Self-pay | Admitting: Oncology

## 2016-03-12 DIAGNOSIS — Z853 Personal history of malignant neoplasm of breast: Secondary | ICD-10-CM

## 2016-03-30 ENCOUNTER — Encounter: Payer: Self-pay | Admitting: Family Medicine

## 2016-04-01 ENCOUNTER — Telehealth: Payer: Self-pay

## 2016-04-01 NOTE — Telephone Encounter (Signed)
Medication Samples have been provided to the patient. (3) Bottles  Drug name: Xarelto      Strength: 20mg         Qty: 7 tabs  LOT: [id number]  Exp.Date: 08-19  Dosing instructions: Take one tablet  By mouth daily  The patient has been instructed regarding the correct time, dose, and frequency of taking this medication, including desired effects and most common side effects.   Laura Li 2:12 PM 04/01/2016

## 2016-04-01 NOTE — Telephone Encounter (Signed)
Called and left a voicemail message asking for a return phone call. She had sent a My Chart message asking for Xarelto samples. I have the samples ready for pick up.

## 2016-04-05 ENCOUNTER — Encounter: Payer: Self-pay | Admitting: Family Medicine

## 2016-04-05 ENCOUNTER — Ambulatory Visit (INDEPENDENT_AMBULATORY_CARE_PROVIDER_SITE_OTHER): Payer: Medicare Other | Admitting: Family Medicine

## 2016-04-05 VITALS — BP 132/76 | HR 77 | Ht 66.0 in | Wt 146.4 lb

## 2016-04-05 DIAGNOSIS — M816 Localized osteoporosis [Lequesne]: Secondary | ICD-10-CM

## 2016-04-05 DIAGNOSIS — E785 Hyperlipidemia, unspecified: Secondary | ICD-10-CM

## 2016-04-05 DIAGNOSIS — Z79899 Other long term (current) drug therapy: Secondary | ICD-10-CM

## 2016-04-05 DIAGNOSIS — E034 Atrophy of thyroid (acquired): Secondary | ICD-10-CM

## 2016-04-05 DIAGNOSIS — F418 Other specified anxiety disorders: Secondary | ICD-10-CM | POA: Diagnosis not present

## 2016-04-05 DIAGNOSIS — I10 Essential (primary) hypertension: Secondary | ICD-10-CM

## 2016-04-05 DIAGNOSIS — I82591 Chronic embolism and thrombosis of other specified deep vein of right lower extremity: Secondary | ICD-10-CM

## 2016-04-05 MED ORDER — LEVOTHYROXINE SODIUM 75 MCG PO TABS: 75.0000 ug | ORAL_TABLET | Freq: Every day | ORAL | 0 refills | Status: DC

## 2016-04-05 MED ORDER — NADOLOL 20 MG PO TABS: 10.0000 mg | ORAL_TABLET | Freq: Every day | ORAL | 0 refills | Status: DC

## 2016-04-05 MED ORDER — VENLAFAXINE HCL ER 150 MG PO CP24: 150.0000 mg | ORAL_CAPSULE | Freq: Every day | ORAL | 0 refills | Status: DC

## 2016-04-05 NOTE — Patient Instructions (Addendum)
Check labs before you leave  We will call you within a week or two about your referral to endocrinology with Dr. Cruzita Lederer to weigh in on reclast option. If you do not hear within 3 weeks, give [place] a call.

## 2016-04-05 NOTE — Assessment & Plan Note (Signed)
S: poorly controlled on no statin so restarted atorvastatin 10mg  last visit. No myalgias.   A/P: update ldl, hopeful significantly improved- Likely would not adjust unless ldl over 130

## 2016-04-05 NOTE — Progress Notes (Signed)
Pre visit review using our clinic review tool, if applicable. No additional management support is needed unless otherwise documented below in the visit note. 

## 2016-04-05 NOTE — Progress Notes (Signed)
Subjective:  Laura Li is a 76 y.o. year old very pleasant female patient who presents for/with See problem oriented charting ROS-No hair or nail changes. No heat/cold intolerance. No chest pain or shortness of breath.   Past Medical History-  Patient Active Problem List   Diagnosis Date Noted  . Chronic deep vein thrombosis (DVT) (HCC) 09/01/2015    Priority: High  . Localized swelling, mass, or lump of lower extremity 11/05/2014    Priority: High  . Pneumothorax 06/15/2014    Priority: High  . Breast cancer of lower-outer quadrant of left female breast ([place]) 04/24/2014    Priority: High  . Vasculitis ([place]) 11/09/2012    Priority: High  . Melanoma of skin ([place]) 03/31/2014    Priority: Medium  . Chronic kidney disease, stage III (moderate) 12/09/2012    Priority: Medium  . 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    Priority: Medium  . DVT, HX OF 12/30/2006    Priority: Medium  . Hypothyroidism 02/06/2006    Priority: Medium  . Hyperlipidemia 02/06/2006    Priority: Medium  . Essential hypertension 02/06/2006    Priority: Medium  . Osteoporosis 02/06/2006    Priority: Medium  . GERD (gastroesophageal reflux disease) 03/31/2014    Priority: Low  . Hot flashes 03/31/2014    Priority: Low  . Osteoarthritis 03/31/2014    Priority: Low  . Allergic rhinitis 02/06/2006    Priority: Low  . Excessive tearing 09/21/2014  . Antineoplastic chemotherapy induced anemia 08/17/2014  . Diverticulitis 08/09/2014  . Bilateral breast cancer ([place]) 04/29/2014  . Family history of breast cancer 04/29/2014  . Breast cancer, right breast ([place]) 04/24/2014    Medications- reviewed and updated Current Outpatient Prescriptions  Medication Sig Dispense Refill  . acetaminophen (TYLENOL) 500 MG tablet Take 500 mg by mouth 2 (two) times daily as needed for mild pain. Reported on 07/07/2015    . atorvastatin (LIPITOR) 10 MG tablet Take 1 tablet (10 mg total) by mouth daily. 90 tablet 3  . B  Complex-C-E-Zn (B COMPLEX-C-E-ZINC) tablet Take 1 tablet by mouth daily.    . Calcium-Vitamin D (CALTRATE 600 PLUS-VIT D PO) Take 1 tablet by mouth 2 (two) times daily.     [name] esomeprazole (NEXIUM) 40 MG capsule TAKE 1 BY MOUTH DAILY 90 capsule 2  . fexofenadine (ALLEGRA) 180 MG tablet Take 180 mg by mouth daily as needed for allergies.     . hypromellose (SYSTANE OVERNIGHT THERAPY) 0.3 % GEL ophthalmic ointment Place 1 application into both eyes at bedtime as needed for dry eyes.    [name] ipratropium (ATROVENT) 0.03 % nasal spray Place 2 sprays into the nose every 12 (twelve) hours. 180 mL 3  . Lactobacillus-Inulin (CULTURELLE DIGESTIVE HEALTH PO) Take 1 capsule by mouth daily.    [name] levothyroxine (SYNTHROID, LEVOTHROID) 75 MCG tablet Take 1 tablet (75 mcg total) by mouth daily. 30 tablet 0  . nadolol (CORGARD) 20 MG tablet Take 0.5 tablets (10 mg total) by mouth daily. 15 tablet 0  . Propylene Glycol (SYSTANE BALANCE) 0.6 % SOLN Apply to eye as needed.    . rivaroxaban (XARELTO) 20 MG TABS tablet Take 1 tablet (20 mg total) by mouth daily with supper. 90 tablet 3  . venlafaxine XR (EFFEXOR-XR) 150 MG 24 hr capsule Take 1 capsule (150 mg total) by mouth daily. 30 capsule 0   No current facility-administered medications for this visit.     Objective: BP 132/76 (BP Location: Left Arm, Patient Position:  Sitting, Cuff Size: Normal)   Pulse 77   Ht 5\' 6"  (1.676 m)   Wt 146 lb 6.4 oz (66.4 kg)   SpO2 96%   BMI 23.63 kg/m  Gen: NAD, resting comfortably CV: RRR no murmurs rubs or gallops Lungs: CTAB no crackles, wheeze, rhonchi Ext: no edema Skin: warm, dry  Assessment/Plan:  Hypothyroidism S: Lab Results  Component Value Date   TSH 2.44 11/28/2015   On thyroid medication-levothyroxine 75 mcg A/P: doing well, update tsh   Essential hypertension S: controlled on nadolol 10mg .  BP Readings from Last 3 Encounters:  04/05/16 132/76  01/16/16 130/82  12/26/15 (!) 123/53  A/P:Continue  current meds:  Short term supply to pharmacy as awaiting mail order  Hyperlipidemia S: poorly controlled on no statin so restarted atorvastatin 10mg  last visit. No myalgias.   A/P: update ldl, hopeful significantly improved- Likely would not adjust unless ldl over 130  Chronic deep vein thrombosis (DVT) (HCC) S: area of swelling in leg has improved/resolved. Given recurrence of DVT (had provoked dvt years ago) have opted for long term xarelto A/P: has some samples of xarelto while waiting on mail order. Continue current medication.    Osteoporosis S: Patient with fragility fracture 2016 and we started fosamax. She had intense joint pain on thi sand had to stop with resolution off of medication. She is compliant with calcium/vitamin D.  A/P: Oncology has recommended considering reclast. I am hesitant with injectable bisphosphonate given prior intense joint pain on oral bisphosphonate. Will refer to Dr. Cruzita Lederer for her expert opinion on this matter.   With chronic nexium- we also opted to check b12 given she also has complained of memory issues. Is on 1038mcg a day otc  4 months   Orders Placed This Encounter  Procedures  . Vitamin B12  . LDL cholesterol, direct    [facility]  . TSH    [place]  . Basic metabolic panel      . Ambulatory referral to Endocrinology    Referral Priority:   Routine    Referral Type:   Consultation    Referral Reason:   Specialty Services Required    Number of Visits Requested:   1    Meds ordered this encounter  Medications  . nadolol (CORGARD) 20 MG tablet    Sig: Take 0.5 tablets (10 mg total) by mouth daily.    Dispense:  15 tablet    Refill:  0  . venlafaxine XR (EFFEXOR-XR) 150 MG 24 hr capsule    Sig: Take 1 capsule (150 mg total) by mouth daily.    Dispense:  30 capsule    Refill:  0  . levothyroxine (SYNTHROID, LEVOTHROID) 75 MCG tablet    Sig: Take 1 tablet (75 mcg total) by mouth daily.    Dispense:  30 tablet    Refill:  0     Return precautions advised.  [name], MD

## 2016-04-05 NOTE — Assessment & Plan Note (Signed)
S: controlled on nadolol 10mg .  BP Readings from Last 3 Encounters:  04/05/16 132/76  01/16/16 130/82  12/26/15 (!) 123/53  A/P:Continue current meds:  Short term supply to pharmacy as awaiting mail order

## 2016-04-05 NOTE — Assessment & Plan Note (Signed)
S: Patient with fragility fracture 2016 and we started fosamax. She had intense joint pain on thi sand had to stop with resolution off of medication. She is compliant with calcium/vitamin D.  A/P: Oncology has recommended considering reclast. I am hesitant with injectable bisphosphonate given prior intense joint pain on oral bisphosphonate. Will refer to Dr. Cruzita Lederer for her expert opinion on this matter.

## 2016-04-05 NOTE — Assessment & Plan Note (Signed)
S: Lab Results  Component Value Date   TSH 2.44 11/28/2015   On thyroid medication-levothyroxine 75 mcg A/P: doing well, update tsh

## 2016-04-05 NOTE — Assessment & Plan Note (Signed)
S: area of swelling in leg has improved/resolved. Given recurrence of DVT (had provoked dvt years ago) have opted for long term xarelto A/P: has some samples of xarelto while waiting on mail order. Continue current medication.

## 2016-04-09 ENCOUNTER — Ambulatory Visit
Admission: RE | Admit: 2016-04-09 | Discharge: 2016-04-09 | Disposition: A | Discharge status: Home / Self Care | Payer: Medicare Other | Source: Ambulatory Visit | Attending: Oncology | Admitting: Oncology

## 2016-04-09 DIAGNOSIS — Z853 Personal history of malignant neoplasm of breast: Secondary | ICD-10-CM

## 2016-04-09 DIAGNOSIS — R928 Other abnormal and inconclusive findings on diagnostic imaging of breast: Secondary | ICD-10-CM | POA: Diagnosis not present

## 2016-04-09 LAB — HM MAMMOGRAPHY: HM Mammogram: NORMAL (ref 0–4)

## 2016-04-18 DIAGNOSIS — H26491 Other secondary cataract, right eye: Secondary | ICD-10-CM | POA: Diagnosis not present

## 2016-04-18 DIAGNOSIS — Z961 Presence of intraocular lens: Secondary | ICD-10-CM | POA: Diagnosis not present

## 2016-04-18 DIAGNOSIS — H524 Presbyopia: Secondary | ICD-10-CM | POA: Diagnosis not present

## 2016-04-18 DIAGNOSIS — H04123 Dry eye syndrome of bilateral lacrimal glands: Secondary | ICD-10-CM | POA: Diagnosis not present

## 2016-04-19 NOTE — Progress Notes (Signed)
Called patient and left a detailed voicemail message asking her to call or stop by office to have labs drawn

## 2016-04-30 ENCOUNTER — Other Ambulatory Visit (INDEPENDENT_AMBULATORY_CARE_PROVIDER_SITE_OTHER): Payer: Medicare Other

## 2016-04-30 DIAGNOSIS — I10 Essential (primary) hypertension: Secondary | ICD-10-CM | POA: Diagnosis not present

## 2016-04-30 DIAGNOSIS — E034 Atrophy of thyroid (acquired): Secondary | ICD-10-CM

## 2016-04-30 DIAGNOSIS — E785 Hyperlipidemia, unspecified: Secondary | ICD-10-CM | POA: Diagnosis not present

## 2016-04-30 DIAGNOSIS — Z79899 Other long term (current) drug therapy: Secondary | ICD-10-CM | POA: Diagnosis not present

## 2016-04-30 LAB — BASIC METABOLIC PANEL
BUN: 21 mg/dL (ref 6–23)
CALCIUM: 9.9 mg/dL (ref 8.4–10.5)
CO2: 29 mEq/L (ref 19–32)
CREATININE: 1.03 mg/dL (ref 0.40–1.20)
Chloride: 101 mEq/L (ref 96–112)
GFR: 55.16 mL/min — AB (ref >60.00)
GLUCOSE: 102 mg/dL — AB (ref 70–99)
Potassium: 4.5 mEq/L (ref 3.5–5.1)
Sodium: 138 mEq/L (ref 135–145)

## 2016-04-30 LAB — TSH: TSH: 1.72 u[IU]/mL (ref 0.35–4.50)

## 2016-04-30 LAB — VITAMIN B12: VITAMIN B 12: 686 pg/mL (ref 211–911)

## 2016-04-30 LAB — LDL CHOLESTEROL, DIRECT: Direct LDL: 119 mg/dL

## 2016-05-03 NOTE — Progress Notes (Signed)
Labs were drawn 04/30/16

## 2016-05-09 ENCOUNTER — Other Ambulatory Visit: Payer: Self-pay | Admitting: Family Medicine

## 2016-05-14 DIAGNOSIS — L814 Other melanin hyperpigmentation: Secondary | ICD-10-CM | POA: Diagnosis not present

## 2016-05-14 DIAGNOSIS — D224 Melanocytic nevi of scalp and neck: Secondary | ICD-10-CM | POA: Diagnosis not present

## 2016-05-14 DIAGNOSIS — B351 Tinea unguium: Secondary | ICD-10-CM | POA: Diagnosis not present

## 2016-05-14 DIAGNOSIS — L821 Other seborrheic keratosis: Secondary | ICD-10-CM | POA: Diagnosis not present

## 2016-05-14 DIAGNOSIS — Z85820 Personal history of malignant melanoma of skin: Secondary | ICD-10-CM | POA: Diagnosis not present

## 2016-05-23 ENCOUNTER — Ambulatory Visit (INDEPENDENT_AMBULATORY_CARE_PROVIDER_SITE_OTHER): Payer: Medicare Other | Admitting: Internal Medicine

## 2016-05-23 ENCOUNTER — Encounter: Payer: Self-pay | Admitting: Internal Medicine

## 2016-05-23 VITALS — BP 124/84 | HR 63 | Ht 66.0 in | Wt 148.0 lb

## 2016-05-23 DIAGNOSIS — M85859 Other specified disorders of bone density and structure, unspecified thigh: Secondary | ICD-10-CM

## 2016-05-23 DIAGNOSIS — M8580 Other specified disorders of bone density and structure, unspecified site: Secondary | ICD-10-CM

## 2016-05-23 DIAGNOSIS — M810 Age-related osteoporosis without current pathological fracture: Secondary | ICD-10-CM | POA: Insufficient documentation

## 2016-05-23 LAB — VITAMIN D 25 HYDROXY (VIT D DEFICIENCY, FRACTURES): VITD: 46.74 ng/mL (ref 30.00–100.00)

## 2016-05-23 NOTE — Progress Notes (Signed)
Patient ID: [name], female   DOB: 03-Nov-1940, 76 y.o.   MRN: [id number]    HPI  Laura Li is a 76 y.o.-year-old very pleasant woman, referred by her PCP, Laura Conch, MD, for management of osteoporosis.  Pt was dx with Osteopenia 2017.  I reviewed pt's DEXA scans: Date L1-L4 T score FN T score FRAX  06/29/2015  L1-L3: -1.4 (-8.1%* from 2008)  RFN: -2.4 (-2.4% from 2008) LFN: -2.4 MOF: 24.5%  Hip fracture risk: 7.6%   09/26/2008  L2-L4: -1.4  RFN: -2.4 LFN: -2.2 MOF: 21.1%  Hip fracture risk: 7.3%   12/24/2001 0.0 RFN: -0.9 LFN: -1.5 On Femara at that time   She had the following fx: - L foot 4th digit phalanx - 05/2015  No dizziness/orthostasis/poor vision. She has vertigo for 20 years. No frequent falls.  Previous OP treatments:  - Fosamax for 3 months >> felt "terrible", sick, severe joint pain << improved on Tylenol; also, had Fosamax in 2003  No h/o vitamin D deficiency. No available vit D levels: No results found for: VD25OH  Pt is on calcium + vitamin D 600-400 2x a day. She also eats dairy and green, leafy, vegetables.  She is on Nexium.  + weight bearing exercises >> started to work with a Psychologist, educational.  She does not take high vitamin A doses.  Menopause was In her 68s, when she had a hysterectomy.   Pt does have a FH of osteoporosis: sister.  No h/o hyper/hypocalcemia or hyperparathyroidism. No h/o kidney stones. Lab Results  Component Value Date   CALCIUM 9.9 04/30/2016   CALCIUM 10.3 12/26/2015   CALCIUM 9.5 11/28/2015   CALCIUM 10.4 06/26/2015   CALCIUM 10.3 06/01/2015   CALCIUM 10.4 04/06/2015   CALCIUM 10.1 01/05/2015   CALCIUM 9.8 10/11/2014   CALCIUM 9.8 09/21/2014   CALCIUM 10.5 (H) 09/13/2014   No h/o thyrotoxicosis. On LT4 75 mcg. Reviewed TSH recent levels:  Lab Results  Component Value Date   TSH 1.72 04/30/2016   TSH 2.44 11/28/2015   TSH 3.13 06/01/2015   TSH 1.45 12/21/2013   TSH 2.72 03/19/2013   No h/o CKD. Last  BUN/Cr: Lab Results  Component Value Date   BUN 21 04/30/2016   CREATININE 1.03 04/30/2016   Pt has HL, HTN, hypothyroidism, short term memory loss. She had BrCA in 2000, second time 2016. Took Tamoxifen. Also, h/o melanoma.  ROS: Constitutional: no weight gain/loss, + fatigue, + subjective hypothermia, + poor sleep, + increased urination Eyes: no blurry vision, no xerophthalmia ENT: no sore throat, no nodules palpated in throat, no dysphagia/odynophagia, no hoarseness, + hypoacusis, + tinnitus Cardiovascular: no CP/SOB/palpitations/leg swelling Respiratory: + cough/no SOB Gastrointestinal: no N/V/D/+ C Musculoskeletal: + both: muscle/joint aches Skin: no rashes Neurological: no tremors/numbness/tingling/dizziness, + HA Psychiatric: + both:depression/anxiety  Past Medical History:  Diagnosis Date  . Anemia    from medicaions  . Arthritis   . Chronic kidney disease    some renal impairment from meds  . Depression    states no depression  . Eczema   . Esophageal stricture   . GERD (gastroesophageal reflux disease)   . Headache   . Heart murmur    states it is benign told 30-40 years ago  . History of DVT (deep vein thrombosis)   . HX: breast cancer    melanoma  . Hyperlipidemia   . Hypertension   . Hypothyroidism   . Melanoma (HCC)   . OA (osteoarthritis)   .  Pneumothorax on right 06/15/2014  . PONV (postoperative nausea and vomiting)   . Wears glasses    Past Surgical History:  Procedure Laterality Date  . APPENDECTOMY  1967  . BREAST LUMPECTOMY     left breast-benign  . BREAST SURGERY  1977   removal of calcified milk gland right breast  . CHEST TUBE INSERTION  06/15/2014  . CHEST TUBE INSERTION Bilateral 06/15/2014   Procedure: CHEST TUBE INSERTION;  Surgeon: Rolm Bookbinder, MD;  Location: [facility];  Service: General;  Laterality: Bilateral;  . COLONOSCOPY    . LIPOMA EXCISION  1978   right breast  . MASTECTOMY  1998   right-nodes out  . MELANOMA EXCISION   2004   right side of face  . MOHS SURGERY  2005   right left-basal cell  . PORT-A-CATH REMOVAL N/A 10/05/2014   Procedure: REMOVAL PORT-A-CATH;  Surgeon: Rolm Bookbinder, MD;  Location: [facility];  Service: General;  Laterality: N/A;  . PORTACATH PLACEMENT Left 06/15/2014   Procedure: INSERTION PORT-A-CATH;  Surgeon: Rolm Bookbinder, MD;  Location: [facility];  Service: General;  Laterality: Left;  . RADIOACTIVE SEED GUIDED MASTECTOMY WITH AXILLARY SENTINEL LYMPH NODE BIOPSY Left 05/05/2014   Procedure: RADIOACTIVE SEED GUIDED LEFT LUMPECTOMY WITH AXILLARY SENTINEL LYMPH NODE BIOPSY;  Surgeon: Rolm Bookbinder, MD;  Location: [facility];  Service: General;  Laterality: Left;  . TONSILLECTOMY AND ADENOIDECTOMY  1949  . TOTAL ABDOMINAL HYSTERECTOMY  1992   including ovaries   Social History   Social History  . Marital status: Widowed    Spouse name: N/A  . Number of children: 1   Occupational History  . retired    Social History Main Topics  . Smoking status: Former Smoker    Packs/day: 0.75    Years: 3.00    Types: Cigarettes    Quit date: 03/11/1969  . Smokeless tobacco: Never Used  . Alcohol use 0.0 oz/week     Comment: occasional wine  . Drug use: No   Social History Narrative   Widowed 2003. Moved to [place] from [place] in 2004 after loss of husband to be near daughter. 1 daughter. 2 grandkids.       Retired from working at a church      Hobbies: Training and development officer   Current Outpatient Prescriptions on File Prior to Visit  Medication Sig Dispense Refill  . acetaminophen (TYLENOL) 500 MG tablet Take 500 mg by mouth 2 (two) times daily as needed for mild pain. Reported on 07/07/2015    . atorvastatin (LIPITOR) 10 MG tablet Take 1 tablet (10 mg total) by mouth daily. 90 tablet 3  . B Complex-C-E-Zn (B COMPLEX-C-E-ZINC) tablet Take 1 tablet by mouth daily.    . Calcium-Vitamin D (CALTRATE 600 PLUS-VIT D PO) Take 1 tablet by mouth 2 (two) times daily.     [name]  esomeprazole (NEXIUM) 40 MG capsule TAKE 1 BY MOUTH DAILY 90 capsule 2  . fexofenadine (ALLEGRA) 180 MG tablet Take 180 mg by mouth daily as needed for allergies.     . hypromellose (SYSTANE OVERNIGHT THERAPY) 0.3 % GEL ophthalmic ointment Place 1 application into both eyes at bedtime as needed for dry eyes.    [name] ipratropium (ATROVENT) 0.03 % nasal spray Place 2 sprays into the nose every 12 (twelve) hours. 180 mL 3  . Lactobacillus-Inulin (CULTURELLE DIGESTIVE HEALTH PO) Take 1 capsule by mouth daily.    [name] levothyroxine (SYNTHROID, LEVOTHROID) 75 MCG tablet Take 1 tablet (75 mcg  total) by mouth daily. 30 tablet 0  . nadolol (CORGARD) 20 MG tablet TAKE 1/2 TABLET(10 MG) BY MOUTH DAILY 45 tablet 2  . Propylene Glycol (SYSTANE BALANCE) 0.6 % SOLN Apply to eye as needed.    . rivaroxaban (XARELTO) 20 MG TABS tablet Take 1 tablet (20 mg total) by mouth daily with supper. 90 tablet 3  . venlafaxine XR (EFFEXOR-XR) 150 MG 24 hr capsule TAKE 1 CAPSULE(150 MG) BY MOUTH DAILY 90 capsule 2   No current facility-administered medications on file prior to visit.    Allergies  Allergen Reactions  . Dyazide [Hydrochlorothiazide W-Triamterene]     Presumption: drug induced vasculitis  . Niacin Other (See Comments)    flushing  . Penicillins Diarrhea and Nausea And Vomiting   Family History  Problem Relation Age of Onset  . Heart disease Father 39    smoker  . Endometrial cancer Mother 56  . Breast cancer Cousin     x 3 cousins with breast cancer (one at 20, one at 14 and one at 10)  . Lung cancer Paternal Aunt     smoker  . Brain cancer Maternal Aunt   . Diabetes Maternal Aunt     ???  . Breast cancer Maternal Aunt 51  . Colon cancer Neg Hx     PE: BP 124/84 (BP Location: Left Arm, Patient Position: Sitting)   Pulse 63   Ht '5\' 6"'$  (1.676 m)   Wt 148 lb (67.1 kg)   SpO2 97%   BMI 23.89 kg/m  Wt Readings from Last 3 Encounters:  05/23/16 148 lb (67.1 kg)  04/05/16 146 lb 6.4 oz (66.4 kg)   01/16/16 143 lb 12.8 oz (65.2 kg)   Constitutional: overweight, in NAD. + kyphosis Eyes: PERRLA, EOMI, no exophthalmos ENT: moist mucous membranes, no thyromegaly, no cervical lymphadenopathy Cardiovascular: RRR, No MRG Respiratory: CTA B Gastrointestinal: abdomen soft, NT, ND, BS+ Musculoskeletal: no deformities, strength intact in all 4 Skin: moist, warm, no rashes Neurological: no tremor with outstretched hands, DTR normal in all 4  Assessment: 1. Osteopenia  Plan: 1. Osteopenia - likely postmenopausal, she also has FH of OP in sister - Discussed about increased risk of fracture, depending on the T score, greatly increased when the T score is lower than -2.5, but it is actually a continuum and -2.5 should not be regarded as an absolute threshold. We reviewed her last 3 DEXA scan reports together, and I explained that based on the T scores, she has an increased risk for fractures. However, it is reassuring that her bone scans did not change significantly between 2010 in 2018. Her calculated FRAX scores are high, but basically unchanged from 2010. Since this is the effect that we usually hope to see from low bone density treatment, we discussed that we can wait to check the stability of her BMD 2 years after the last DEXA scan and decide at that time whether she would benefit from medications or not. - We discussed about the different medication classes, benefits and side effects (including atypical fractures and ONJ - no dental workup in progress or planned).  - I explained that, since she had intolerance to Fosamax, I would not use oral bisphosphonates, so my first choice would be sq denosumab (Prolia) for 3-6 years, then zoledronic acid (iv Reclast) for 1-2 years. I would use Teriparatide as a last resort. However, as discussed, we will decide pro or con medication after the next DEXA scan in 06/2017 - we  reviewed her dietary and supplemental calcium and vitamin D intake. I recommended to  make sure she gets 1000-1200 mg of calcium daily and I will check vit D today to see if she needs further supplementation - given her specific instructions about food sources for Calcium and Vitamin D - see pt instructions. For now, I did advise her to reduce her calcium plus vitamin D supplement to just once a day, pending further recommendations regarding her vitamin D supplementation. - She is not smoking or >2 drinks of alcohol a day. - discussed fall precautions   - given handout from [place] Re: weight bearing exercises - advised to do this every day or at least 5/7 days - will order a new DEXA scan at next visit - will see pt back in a year  - time spent with the patient: 1 hour, of which >50% was spent in obtaining information about her symptoms, reviewing her previous labs, evaluations, and treatments, counseling her about her condition (please see the discussed topics above), and developing a plan to further investigate and treat it; she had a number of questions which I addressed.  Office Visit on 05/23/2016  Component Date Value Ref Range Status  . VITD 05/23/2016 46.74  30.00 - 100.00 ng/mL Final  Msg sent: Dear Laura Li, Your vitamin D level is very good. Since we decreased the dose of your calcium + vitamin D combination pill to only once a day, I would suggest to add [phone] units (whichever dose you can find) of vitamin D daily. Sincerely, Philemon Kingdom MD  Philemon Kingdom, MD PhD [facility]

## 2016-05-23 NOTE — Patient Instructions (Signed)
Please stop at the lab.  Please reduce the Calcium + vitamin D to once a day.  Please return in 1 year.  How Can I Prevent Falls? Men and women with osteoporosis need to take care not to fall down. Falls can break bones. Some reasons people fall are: Poor vision  Poor balance  Certain diseases that affect how you walk  Some types of medicine, such as sleeping pills.  Some tips to help prevent falls outdoors are: Use a cane or walker  Wear rubber-soled shoes so you don't slip  Walk on grass when sidewalks are slippery  In winter, put salt or kitty litter on icy sidewalks.  Some ways to help prevent falls indoors are: Keep rooms free of clutter, especially on floors  Use plastic or carpet runners on slippery floors  Wear low-heeled shoes that provide good support  Do not walk in socks, stockings, or slippers  Be sure carpets and area rugs have skid-proof backs or are tacked to the floor  Be sure stairs are well lit and have rails on both sides  Put grab bars on bathroom walls near tub, shower, and toilet  Use a rubber bath mat in the shower or tub  Keep a flashlight next to your bed  Use a sturdy step stool with a handrail and wide steps  Add more lights in rooms (and night lights) Buy a cordless phone to keep with you so that you don't have to rush to the phone       when it rings and so that you can call for help if you fall.   (adapted from http://www.niams.NightlifePreviews.se)  Dietary sources of calcium and vitamin D:  Calcium content (mg) - http://www.niams.MoviePins.co.za  Fortified oatmeal, 1 packet 350  Sardines, canned in oil, with edible bones, 3 oz. 324  Cheddar cheese, 1 oz. shredded 306  Milk, nonfat, 1 cup 302  Milkshake, 1 cup 300  Yogurt, plain, low-fat, 1 cup 300  Soybeans, cooked, 1 cup 261  Tofu, firm, with calcium,  cup 204  Orange juice, fortified with calcium, 6 oz. 200-260 (varies)   Salmon, canned, with edible bones, 3 oz. 181  Pudding, instant, made with 2% milk,  cup 153  Baked beans, 1 cup [place], 1% milk fat, 1 cup 138  Spaghetti, lasagna, 1 cup 125  Frozen yogurt, vanilla, soft-serve,  cup 103  Ready-to-eat cereal, fortified with calcium, 1 cup 100-1,000 (varies)  Cheese pizza, 1 slice 694  Fortified waffles, 2 100  Turnip greens, boiled,  cup 99  Broccoli, raw, 1 cup 90  Ice cream, vanilla,  cup 85  Soy or rice milk, fortified with calcium, 1 cup 80-500 (varies)   Vitamin D content (International Units, IU) - https://www.ars.usda.gov Cod liver oil, 1 tablespoon 1,360  Swordfish, cooked, 3 oz 566  Salmon (sockeye), cooked, 3 oz 447  Tuna fish, canned in water, drained, 3 oz 154  Orange juice fortified with vitamin D, 1 cup (check product labels, as amount of added vitamin D varies) 137  Milk, nonfat, reduced fat, and whole, vitamin D-fortified, 1 cup 115-124  Yogurt, fortified with 20% of the daily value for vitamin D, 6 oz 80  Margarine, fortified, 1 tablespoon 60  Sardines, canned in oil, drained, 2 sardines 46  Liver, beef, cooked, 3 oz 42  Egg, 1 large (vitamin D is found in yolk) 41  Ready-to-eat cereal, fortified with 10% of the daily value for vitamin D, 0.75-1 cup  40  Cheese, Swiss, 1 oz 6   Exercise for Strong Bones (from [place]) There are two types of exercises that are important for building and maintaining bone density:  weight-bearing and muscle-strengthening exercises. Weight-bearing Exercises These exercises include activities that make you move against gravity while staying upright. Weight-bearing exercises can be high-impact or low-impact. High-impact weight-bearing exercises help build bones and keep them strong. If you have broken a bone due to osteoporosis or are at risk of breaking a bone, you may need to avoid high-impact exercises. If you're not sure, you should check with your healthcare  provider. Examples of high-impact weight-bearing exercises are: . Dancing . Doing high-impact aerobics . Hiking . Jogging/running . Jumping Rope . Stair climbing . Tennis Low-impact weight-bearing exercises can also help keep bones strong and are a safe alternative if you cannot do high-impact exercises. Examples of low-impact weight-bearing exercises are: . Using elliptical training machines . Doing low-impact aerobics . Using stair-step machines . Fast walking on a treadmill or outside Muscle-Strengthening Exercises These exercises include activities where you move your body, a weight or some other resistance against gravity. They are also known as resistance exercises and include: . Lifting weights . Using elastic exercise bands . Using weight machines . Lifting your own body weight . Functional movements, such as standing and rising up on your toes Yoga and Pilates can also improve strength, balance and flexibility. However, certain positions may not be safe for people with osteoporosis or those at increased risk of broken bones. For example, exercises that have you bend forward may increase the chance of breaking a bone in the spine. A physical therapist should be able to help you learn which exercises are safe and appropriate for you. Non-Impact Exercises Non-impact exercises can help you to improve balance, posture and how well you move in everyday activities. These exercises can also help to increase muscle strength and decrease the risk of falls and broken bones. Some of these exercises include: . Balance exercises that strengthen your legs and test your balance, such as Tai Chi, can decrease your risk of falls. . Posture exercises that improve your posture and reduce rounded or "sloping" shoulders can help you decrease the chance of breaking a bone, especially in the spine. . Functional exercises that improve how well you move can help you with everyday activities and decrease  your chance of falling and breaking a bone. For example, if you have trouble getting up from a chair or climbing stairs, you should do these activities as exercises. A physical therapist can teach you balance, posture and functional exercises. Starting a New Exercise Program If you haven't exercised regularly for a while, check with your healthcare provider before beginning a new exercise program-particularly if you have health problems such as heart disease, diabetes or high blood pressure. If you're at high risk of breaking a bone, you should work with a physical therapist to develop a safe exercise program. Once you have your healthcare provider's approval, start slowly. If you've already broken bones in the spine because of osteoporosis, be very careful to avoid activities that require reaching down, bending forward, rapid twisting motions, heavy lifting and those that increase your chance of a fall. As you get started, your muscles may feel sore for a day or two after you exercise. If soreness lasts longer, you may be working too hard and need to ease up. Exercises should be done in a pain-free range of motion. How Much Exercise Do You Need?  Weight-bearing exercises 30 minutes on most days of the week. Do a 30-minutesession or multiple sessions spread out throughout the day. The benefits to your bones are the same.   Muscle-strengthening exercises Two to three days per week. If you don't have much time for strengthening/resistance training, do small amounts at a time. You can do just one body part each day. For example do arms one day, legs the next and trunk the next. You can also spread these exercises out during your normal day.  Balance, posture and functional exercises Every day or as often as needed. You may want to focus on one area more than the others. If you have fallen or lose your balance, spend time doing balance exercises. If you are getting rounded shoulders, work more on posture  exercises. If you have trouble climbing stairs or getting up from the couch, do more functional exercises. You can also perform these exercises at one time or spread them during your day. Work with a phyiscal therapist to learn the right exercises for you.     

## 2016-05-28 ENCOUNTER — Inpatient Hospital Stay (HOSPITAL_COMMUNITY)
Admission: EM | Admit: 2016-05-28 | Discharge: 2016-05-31 | DRG: 871 | Disposition: A | Discharge status: Home Health | Payer: Medicare Other | Attending: Internal Medicine | Admitting: Internal Medicine

## 2016-05-28 ENCOUNTER — Emergency Department (HOSPITAL_COMMUNITY): Payer: Medicare Other

## 2016-05-28 ENCOUNTER — Encounter (HOSPITAL_COMMUNITY): Payer: Self-pay

## 2016-05-28 DIAGNOSIS — R404 Transient alteration of awareness: Secondary | ICD-10-CM | POA: Diagnosis not present

## 2016-05-28 DIAGNOSIS — Z86718 Personal history of other venous thrombosis and embolism: Secondary | ICD-10-CM

## 2016-05-28 DIAGNOSIS — A419 Sepsis, unspecified organism: Principal | ICD-10-CM | POA: Diagnosis present

## 2016-05-28 DIAGNOSIS — E039 Hypothyroidism, unspecified: Secondary | ICD-10-CM | POA: Diagnosis present

## 2016-05-28 DIAGNOSIS — Z87891 Personal history of nicotine dependence: Secondary | ICD-10-CM

## 2016-05-28 DIAGNOSIS — Z7901 Long term (current) use of anticoagulants: Secondary | ICD-10-CM

## 2016-05-28 DIAGNOSIS — I7389 Other specified peripheral vascular diseases: Secondary | ICD-10-CM | POA: Diagnosis present

## 2016-05-28 DIAGNOSIS — Z881 Allergy status to other antibiotic agents status: Secondary | ICD-10-CM

## 2016-05-28 DIAGNOSIS — I480 Paroxysmal atrial fibrillation: Secondary | ICD-10-CM | POA: Diagnosis not present

## 2016-05-28 DIAGNOSIS — N183 Chronic kidney disease, stage 3 unspecified: Secondary | ICD-10-CM | POA: Diagnosis present

## 2016-05-28 DIAGNOSIS — N3000 Acute cystitis without hematuria: Secondary | ICD-10-CM | POA: Diagnosis not present

## 2016-05-28 DIAGNOSIS — Z833 Family history of diabetes mellitus: Secondary | ICD-10-CM

## 2016-05-28 DIAGNOSIS — Z9221 Personal history of antineoplastic chemotherapy: Secondary | ICD-10-CM

## 2016-05-28 DIAGNOSIS — G92 Toxic encephalopathy: Secondary | ICD-10-CM | POA: Diagnosis present

## 2016-05-28 DIAGNOSIS — K219 Gastro-esophageal reflux disease without esophagitis: Secondary | ICD-10-CM | POA: Diagnosis present

## 2016-05-28 DIAGNOSIS — R410 Disorientation, unspecified: Secondary | ICD-10-CM | POA: Diagnosis not present

## 2016-05-28 DIAGNOSIS — Z8049 Family history of malignant neoplasm of other genital organs: Secondary | ICD-10-CM

## 2016-05-28 DIAGNOSIS — I129 Hypertensive chronic kidney disease with stage 1 through stage 4 chronic kidney disease, or unspecified chronic kidney disease: Secondary | ICD-10-CM | POA: Diagnosis present

## 2016-05-28 DIAGNOSIS — R531 Weakness: Secondary | ICD-10-CM | POA: Diagnosis not present

## 2016-05-28 DIAGNOSIS — I444 Left anterior fascicular block: Secondary | ICD-10-CM | POA: Diagnosis not present

## 2016-05-28 DIAGNOSIS — Z85820 Personal history of malignant melanoma of skin: Secondary | ICD-10-CM

## 2016-05-28 DIAGNOSIS — Z8249 Family history of ischemic heart disease and other diseases of the circulatory system: Secondary | ICD-10-CM

## 2016-05-28 DIAGNOSIS — Z808 Family history of malignant neoplasm of other organs or systems: Secondary | ICD-10-CM

## 2016-05-28 DIAGNOSIS — N390 Urinary tract infection, site not specified: Secondary | ICD-10-CM

## 2016-05-28 DIAGNOSIS — I10 Essential (primary) hypertension: Secondary | ICD-10-CM | POA: Diagnosis present

## 2016-05-28 DIAGNOSIS — M1990 Unspecified osteoarthritis, unspecified site: Secondary | ICD-10-CM | POA: Diagnosis present

## 2016-05-28 DIAGNOSIS — R296 Repeated falls: Secondary | ICD-10-CM | POA: Diagnosis not present

## 2016-05-28 DIAGNOSIS — Z9011 Acquired absence of right breast and nipple: Secondary | ICD-10-CM

## 2016-05-28 DIAGNOSIS — E785 Hyperlipidemia, unspecified: Secondary | ICD-10-CM | POA: Diagnosis present

## 2016-05-28 DIAGNOSIS — F329 Major depressive disorder, single episode, unspecified: Secondary | ICD-10-CM | POA: Diagnosis present

## 2016-05-28 DIAGNOSIS — Z801 Family history of malignant neoplasm of trachea, bronchus and lung: Secondary | ICD-10-CM

## 2016-05-28 DIAGNOSIS — M8580 Other specified disorders of bone density and structure, unspecified site: Secondary | ICD-10-CM | POA: Diagnosis present

## 2016-05-28 DIAGNOSIS — R4182 Altered mental status, unspecified: Secondary | ICD-10-CM | POA: Diagnosis not present

## 2016-05-28 DIAGNOSIS — G9340 Encephalopathy, unspecified: Secondary | ICD-10-CM

## 2016-05-28 DIAGNOSIS — W19XXXA Unspecified fall, initial encounter: Secondary | ICD-10-CM

## 2016-05-28 DIAGNOSIS — L309 Dermatitis, unspecified: Secondary | ICD-10-CM | POA: Diagnosis present

## 2016-05-28 DIAGNOSIS — Z87440 Personal history of urinary (tract) infections: Secondary | ICD-10-CM

## 2016-05-28 DIAGNOSIS — Z853 Personal history of malignant neoplasm of breast: Secondary | ICD-10-CM

## 2016-05-28 DIAGNOSIS — R42 Dizziness and giddiness: Secondary | ICD-10-CM | POA: Diagnosis not present

## 2016-05-28 DIAGNOSIS — Z888 Allergy status to other drugs, medicaments and biological substances status: Secondary | ICD-10-CM

## 2016-05-28 DIAGNOSIS — Z880 Allergy status to penicillin: Secondary | ICD-10-CM

## 2016-05-28 DIAGNOSIS — Z803 Family history of malignant neoplasm of breast: Secondary | ICD-10-CM

## 2016-05-28 DIAGNOSIS — R079 Chest pain, unspecified: Secondary | ICD-10-CM | POA: Diagnosis not present

## 2016-05-28 LAB — URINALYSIS, ROUTINE W REFLEX MICROSCOPIC
BACTERIA UA: NONE SEEN
BILIRUBIN URINE: NEGATIVE
Glucose, UA: NEGATIVE mg/dL
HGB URINE DIPSTICK: NEGATIVE
KETONES UR: NEGATIVE mg/dL
NITRITE: NEGATIVE
PH: 7 (ref 5.0–8.0)
Protein, ur: NEGATIVE mg/dL
SPECIFIC GRAVITY, URINE: 1.014 (ref 1.005–1.030)

## 2016-05-28 LAB — COMPREHENSIVE METABOLIC PANEL
ALK PHOS: 49 U/L (ref 38–126)
ALT: 19 U/L (ref 14–54)
ANION GAP: 12 (ref 5–15)
AST: 38 U/L (ref 15–41)
Albumin: 4.3 g/dL (ref 3.5–5.0)
BUN: 20 mg/dL (ref 6–20)
CALCIUM: 9.6 mg/dL (ref 8.9–10.3)
CO2: 25 mmol/L (ref 22–32)
CREATININE: 1.04 mg/dL — AB (ref 0.44–1.00)
Chloride: 97 mmol/L — ABNORMAL LOW (ref 101–111)
GFR, EST AFRICAN AMERICAN: 59 mL/min — AB (ref >60)
GFR, EST NON AFRICAN AMERICAN: 50 mL/min — AB (ref >60)
Glucose, Bld: 123 mg/dL — ABNORMAL HIGH (ref 65–99)
Potassium: 4.2 mmol/L (ref 3.5–5.1)
Sodium: 134 mmol/L — ABNORMAL LOW (ref 135–145)
TOTAL PROTEIN: 6.9 g/dL (ref 6.5–8.1)
Total Bilirubin: 1.1 mg/dL (ref 0.3–1.2)

## 2016-05-28 LAB — CBC WITH DIFFERENTIAL/PLATELET
BASOS ABS: 0 10*3/uL (ref 0.0–0.1)
BASOS PCT: 0 %
Eosinophils Absolute: 0 10*3/uL (ref 0.0–0.7)
Eosinophils Relative: 0 %
HEMATOCRIT: 36.3 % (ref 36.0–46.0)
HEMOGLOBIN: 12 g/dL (ref 12.0–15.0)
Lymphocytes Relative: 3 %
Lymphs Abs: 0.3 10*3/uL — ABNORMAL LOW (ref 0.7–4.0)
MCH: 30.4 pg (ref 26.0–34.0)
MCHC: 33.1 g/dL (ref 30.0–36.0)
MCV: 91.9 fL (ref 78.0–100.0)
MONOS PCT: 4 %
Monocytes Absolute: 0.4 10*3/uL (ref 0.1–1.0)
NEUTROS ABS: 10.2 10*3/uL — AB (ref 1.7–7.7)
NEUTROS PCT: 93 %
Platelets: 151 10*3/uL (ref 150–400)
RBC: 3.95 MIL/uL (ref 3.87–5.11)
RDW: 13 % (ref 11.5–15.5)
WBC: 10.9 10*3/uL — ABNORMAL HIGH (ref 4.0–10.5)

## 2016-05-28 LAB — I-STAT TROPONIN, ED: Troponin i, poc: 0.01 ng/mL (ref 0.00–0.08)

## 2016-05-28 MED ORDER — DEXTROSE 5 % IV SOLN
1.0000 g | Freq: Once | INTRAVENOUS | Status: AC
  Administered 2016-05-29: 1 g via INTRAVENOUS
  Filled 2016-05-28: qty 10

## 2016-05-28 MED ORDER — SODIUM CHLORIDE 0.9 % IV BOLUS (SEPSIS)
500.0000 mL | Freq: Once | INTRAVENOUS | Status: AC
  Administered 2016-05-28: 500 mL via INTRAVENOUS

## 2016-05-28 NOTE — ED Notes (Addendum)
Delay in lab draw pt not in room at this time.  Nurse will draw labs

## 2016-05-28 NOTE — ED Notes (Signed)
Pt taken to CT.

## 2016-05-28 NOTE — ED Notes (Signed)
Pt back from CT

## 2016-05-28 NOTE — ED Provider Notes (Signed)
Emergency Department Provider Note   I have reviewed the triage vital signs and the nursing notes.   HISTORY  Chief Complaint Fall and Altered Mental Status   HPI LUAN URBANI is a 76 y.o. female with PMH of CKD, GERD, HLD, and HTN presents to the emergency department for evaluation of worsening generalized weakness, questionable fall with inability to stand on her own, and burning urination over the past several days. The daughter bedside states that she seemed much more confused than normal. She states at baseline she may have some difficulty finding a word but she is been significantly more confused today. They called the primary care physician yesterday who cannot see them but did start an antibiotic for presumed urine infection with some dysuria noted. She took this medication with no significant improvement in symptoms. The patient apparently had a fall today that was unwitnessed and then had to call to a neighbor for help. No fevers or chills. Patient denies any chest pain or difficulty breathing. History is somewhat limited by confusion   Level 5 caveat: confusion   Past Medical History:  Diagnosis Date  . Anemia    from medicaions  . Arthritis   . Chronic kidney disease    some renal impairment from meds  . Depression    states no depression  . Eczema   . Esophageal stricture   . GERD (gastroesophageal reflux disease)   . Headache   . Heart murmur    states it is benign told 30-40 years ago  . History of DVT (deep vein thrombosis)   . HX: breast cancer    melanoma  . Hyperlipidemia   . Hypertension   . Hypothyroidism   . Melanoma ([place])   . OA (osteoarthritis)   . Pneumothorax on right 06/15/2014  . PONV (postoperative nausea and vomiting)   . Wears glasses     Patient Active Problem List   Diagnosis Date Noted  . Generalized weakness 05/28/2016  . Osteopenia 05/23/2016  . Chronic deep vein thrombosis (DVT) ([place]) 09/01/2015  . Localized swelling,  mass, or lump of lower extremity 11/05/2014  . Excessive tearing 09/21/2014  . Antineoplastic chemotherapy induced anemia 08/17/2014  . Diverticulitis 08/09/2014  . Pneumothorax 06/15/2014  . Bilateral breast cancer ([place]) 04/29/2014  . Family history of breast cancer 04/29/2014  . Breast cancer of lower-outer quadrant of left female breast ([place]) 04/24/2014  . Breast cancer, right breast ([place]) 04/24/2014  . GERD (gastroesophageal reflux disease) 03/31/2014  . Hot flashes 03/31/2014  . Osteoarthritis 03/31/2014  . Melanoma of skin ([place]) 03/31/2014  . Chronic kidney disease, stage III (moderate) 12/09/2012  . 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  . Vasculitis ([place]) 11/09/2012  . DVT, HX OF 12/30/2006  . Hypothyroidism 02/06/2006  . Hyperlipidemia 02/06/2006  . Essential hypertension 02/06/2006  . Allergic rhinitis 02/06/2006    Past Surgical History:  Procedure Laterality Date  . APPENDECTOMY  1967  . BREAST LUMPECTOMY     left breast-benign  . BREAST SURGERY  1977   removal of calcified milk gland right breast  . CHEST TUBE INSERTION  06/15/2014  . CHEST TUBE INSERTION Bilateral 06/15/2014   Procedure: CHEST TUBE INSERTION;  Surgeon: Rolm Bookbinder, MD;  Location: [place];  Service: General;  Laterality: Bilateral;  . COLONOSCOPY    . LIPOMA EXCISION  1978   right breast  . MASTECTOMY  1998   right-nodes out  . MELANOMA EXCISION  2004   right side of face  .  MOHS SURGERY  2005   right left-basal cell  . PORT-A-CATH REMOVAL N/A 10/05/2014   Procedure: REMOVAL PORT-A-CATH;  Surgeon: Rolm Bookbinder, MD;  Location: [place];  Service: General;  Laterality: N/A;  . PORTACATH PLACEMENT Left 06/15/2014   Procedure: INSERTION PORT-A-CATH;  Surgeon: Rolm Bookbinder, MD;  Location: [place];  Service: General;  Laterality: Left;  . RADIOACTIVE SEED GUIDED MASTECTOMY WITH AXILLARY SENTINEL LYMPH NODE BIOPSY Left 05/05/2014   Procedure: RADIOACTIVE SEED GUIDED LEFT  LUMPECTOMY WITH AXILLARY SENTINEL LYMPH NODE BIOPSY;  Surgeon: Rolm Bookbinder, MD;  Location: [place];  Service: General;  Laterality: Left;  . TONSILLECTOMY AND ADENOIDECTOMY  1949  . TOTAL ABDOMINAL HYSTERECTOMY  1992   including ovaries    Current Outpatient Rx  . Order #: [id number] Class: Historical Med  . Order #: [id number] Class: Normal  . Order #: [id number] Class: Historical Med  . Order #: [id number] Class: Historical Med  . Order #: [id number] Class: Historical Med  . Order #: [id number] Class: Historical Med  . Order #: [id number] Class: Normal  . Order #: [id number] Class: Historical Med  . Order #: [id number] Class: Historical Med  . Order #: [id number] Class: Normal  . Order #: [id number] Class: Historical Med  . Order #: [id number] Class: Normal  . Order #: [id number] Class: Normal  . Order #: [id number] Class: Historical Med  . Order #: [id number] Class: Historical Med  . Order #: [id number] Class: Normal  . Order #: [id number] Class: Normal    Allergies Dyazide [hydrochlorothiazide w-triamterene]; Niacin; and Penicillins  Family History  Problem Relation Age of Onset  . Heart disease Father 42    smoker  . Endometrial cancer Mother 23  . Breast cancer Cousin     x 3 cousins with breast cancer (one at 7, one at 71 and one at 53)  . Lung cancer Paternal Aunt     smoker  . Brain cancer Maternal Aunt   . Diabetes Maternal Aunt     ???  . Breast cancer Maternal Aunt 51  . Colon cancer Neg Hx     Social History Social History  Substance Use Topics  . Smoking status: Former Smoker    Packs/day: 0.75    Years: 3.00    Types: Cigarettes    Quit date: 03/11/1969  . Smokeless tobacco: Never Used  . Alcohol use 0.0 oz/week     Comment: occasional wine    Review of Systems  Level 5 caveat: confusion.   ____________________________________________   PHYSICAL EXAM:  VITAL SIGNS: ED Triage Vitals  Enc Vitals Group     BP 05/28/16 2017 136/72     Pulse Rate  05/28/16 2015 94     Resp 05/28/16 2015 18     Temp 05/28/16 2022 99.5 F (37.5 C)     Temp Source 05/28/16 2022 Oral     SpO2 05/28/16 2015 100 %     Weight 05/28/16 2015 148 lb (67.1 kg)     Height 05/28/16 2015 5\' 5"  (1.651 m)     Pain Score 05/28/16 2016 5    Constitutional: Alert but with confusion.  Eyes: Conjunctivae are normal. PERRL.  Head: Atraumatic. Nose: No congestion/rhinnorhea. Mouth/Throat: Mucous membranes are moist.   Neck: No stridor. No meningeal signs.  Cardiovascular: Normal rate, regular rhythm. Good peripheral circulation. Grossly normal heart sounds.   Respiratory: Normal respiratory effort.  No retractions. Lungs CTAB. Gastrointestinal: Soft and nontender. No distention.  Musculoskeletal: No lower extremity tenderness nor edema. No gross deformities of extremities. Neurologic:  Normal speech and language.  No gross focal neurologic deficits are appreciated.  Skin:  Skin is warm, dry and intact. No rash noted. Psychiatric: Mood and affect are normal. Speech and behavior are normal.  ____________________________________________   LABS (all labs ordered are listed, but only abnormal results are displayed)  Labs Reviewed  COMPREHENSIVE METABOLIC PANEL - Abnormal; Notable for the following:       Result Value   Sodium 134 (*)    Chloride 97 (*)    Glucose, Bld 123 (*)    Creatinine, Ser 1.04 (*)    GFR calc non Af Amer 50 (*)    GFR calc Af Amer 59 (*)    All other components within normal limits  CBC WITH DIFFERENTIAL/PLATELET - Abnormal; Notable for the following:    WBC 10.9 (*)    Neutro Abs 10.2 (*)    Lymphs Abs 0.3 (*)    All other components within normal limits  URINALYSIS, ROUTINE W REFLEX MICROSCOPIC - Abnormal; Notable for the following:    Leukocytes, UA MODERATE (*)    Squamous Epithelial / LPF 0-5 (*)    All other components within normal limits  CULTURE, BLOOD (ROUTINE X 2)  CULTURE, BLOOD (ROUTINE X 2)  I-STAT TROPOININ, ED    ____________________________________________  EKG   EKG Interpretation  Date/Time:  Tuesday May 28 2016 20:16:21 EDT Ventricular Rate:  94 PR Interval:    QRS Duration: 101 QT Interval:  373 QTC Calculation: 467 R Axis:   -55 Text Interpretation:  Sinus rhythm Prolonged PR interval LAD, consider left anterior fascicular block Abnormal R-wave progression, early transition Left ventricular hypertrophy ST elevation, consider inferior injury No STEMI.  Confirmed by LONG MD, JOSHUA [phone]) on 05/28/2016 8:35:15 PM       ____________________________________________  RADIOLOGY  Dg Chest 2 View  Result Date: 05/28/2016 CLINICAL DATA:  Fall and confusion. Daughter says patient has been falling and has been confused over the last couple days, Pt. Denies any chest pain or SOB. EXAM: CHEST  2 VIEW COMPARISON:  06/22/2014; 06/15/2014 FINDINGS: Grossly unchanged cardiac silhouette and mediastinal contours. Interval removal of left subclavian vein approach port a catheter. Improved inspiratory effort. A cardiac pad overlies the right lung apex. No focal airspace opacities. No pleural effusion or pneumothorax. No evidence of edema. No acute osseus abnormalities. Surgical clips overlie the left breast. Post right-sided mastectomy. No acute osseus abnormalities. IMPRESSION: No acute cardiopulmonary disease. Electronically Signed   By: Sandi Mariscal M.D.   On: 05/28/2016 20:44   CT Head:   IMPRESSION: 1. No evidence of traumatic intracranial injury or fracture. 2. Mild cortical volume loss and scattered small vessel ischemic microangiopathy. ____________________________________________   PROCEDURES  Procedure(s) performed:   Procedures  None ____________________________________________   INITIAL IMPRESSION / ASSESSMENT AND PLAN / ED COURSE  Pertinent labs & imaging results that were available during my care of the patient were reviewed by me and considered in my medical decision making  (see chart for details).  Patient resents to the emergency department for evaluation of fall and difficulty getting up. She is experiencing some generalized weakness and dysuria. She was started on an antibiotic yesterday for UTI symptoms continued to get worse. With questionable fall that was unwitnessed and patient's worsening confusion plan for CT scan of the head along with chest x-ray and lab work. Patient does describe a "funny feeling" in the chest but EKG is largely unremarkable. Will follow troponin and reassess after IVF.   Patient with moderate Leuk Esterase on  UA. Suspect that patient's symptoms are 2/2 partially treated UTI. She is generally weak, falling and unable to get up, and lives alone. Do not think she would do well at home on continued outpatient UTI treatment. No evidence of urosepsis.   Discussed patient's case with hospitalist, Dr. Eulas Post. Patient and family (if present) updated with plan. Care transferred to hospitalist service.  I reviewed all nursing notes, vitals, pertinent old records, EKGs, labs, imaging (as available).  ____________________________________________  FINAL CLINICAL IMPRESSION(S) / ED DIAGNOSES  Final diagnoses:  Acute cystitis without hematuria     MEDICATIONS GIVEN DURING THIS VISIT:  Medications  cefTRIAXone (ROCEPHIN) 1 g in dextrose 5 % 50 mL IVPB (1 g Intravenous New Bag/Given 05/29/16 0024)  acetaminophen (TYLENOL) tablet 650 mg (not administered)  sodium chloride 0.9 % bolus 500 mL (0 mLs Intravenous Stopped 05/29/16 0024)     NEW OUTPATIENT MEDICATIONS STARTED DURING THIS VISIT:  None   Note:  This document was prepared using Dragon voice recognition software and may include unintentional dictation errors.  Nanda Quinton, MD Emergency Medicine   Margette Fast, MD 05/29/16 [phone]

## 2016-05-28 NOTE — ED Triage Notes (Signed)
Pt presents to the ed with ems after her daughter called 911 because she has been falling and confused over the last couple days, she also has been complaining of burning with urination, denies loc and complains of a headache

## 2016-05-29 DIAGNOSIS — K219 Gastro-esophageal reflux disease without esophagitis: Secondary | ICD-10-CM | POA: Diagnosis not present

## 2016-05-29 DIAGNOSIS — R296 Repeated falls: Secondary | ICD-10-CM

## 2016-05-29 DIAGNOSIS — F329 Major depressive disorder, single episode, unspecified: Secondary | ICD-10-CM | POA: Diagnosis present

## 2016-05-29 DIAGNOSIS — G9340 Encephalopathy, unspecified: Secondary | ICD-10-CM

## 2016-05-29 DIAGNOSIS — Z87891 Personal history of nicotine dependence: Secondary | ICD-10-CM | POA: Diagnosis not present

## 2016-05-29 DIAGNOSIS — Z8249 Family history of ischemic heart disease and other diseases of the circulatory system: Secondary | ICD-10-CM | POA: Diagnosis not present

## 2016-05-29 DIAGNOSIS — G92 Toxic encephalopathy: Secondary | ICD-10-CM | POA: Diagnosis not present

## 2016-05-29 DIAGNOSIS — I129 Hypertensive chronic kidney disease with stage 1 through stage 4 chronic kidney disease, or unspecified chronic kidney disease: Secondary | ICD-10-CM | POA: Diagnosis not present

## 2016-05-29 DIAGNOSIS — Z801 Family history of malignant neoplasm of trachea, bronchus and lung: Secondary | ICD-10-CM | POA: Diagnosis not present

## 2016-05-29 DIAGNOSIS — Z85820 Personal history of malignant melanoma of skin: Secondary | ICD-10-CM | POA: Diagnosis not present

## 2016-05-29 DIAGNOSIS — Z8049 Family history of malignant neoplasm of other genital organs: Secondary | ICD-10-CM | POA: Diagnosis not present

## 2016-05-29 DIAGNOSIS — I444 Left anterior fascicular block: Secondary | ICD-10-CM | POA: Diagnosis not present

## 2016-05-29 DIAGNOSIS — M1990 Unspecified osteoarthritis, unspecified site: Secondary | ICD-10-CM | POA: Diagnosis present

## 2016-05-29 DIAGNOSIS — L309 Dermatitis, unspecified: Secondary | ICD-10-CM | POA: Diagnosis present

## 2016-05-29 DIAGNOSIS — E039 Hypothyroidism, unspecified: Secondary | ICD-10-CM | POA: Diagnosis not present

## 2016-05-29 DIAGNOSIS — W19XXXA Unspecified fall, initial encounter: Secondary | ICD-10-CM

## 2016-05-29 DIAGNOSIS — N3000 Acute cystitis without hematuria: Secondary | ICD-10-CM | POA: Diagnosis not present

## 2016-05-29 DIAGNOSIS — Z86718 Personal history of other venous thrombosis and embolism: Secondary | ICD-10-CM | POA: Diagnosis not present

## 2016-05-29 DIAGNOSIS — Z853 Personal history of malignant neoplasm of breast: Secondary | ICD-10-CM | POA: Diagnosis not present

## 2016-05-29 DIAGNOSIS — N183 Chronic kidney disease, stage 3 (moderate): Secondary | ICD-10-CM | POA: Diagnosis not present

## 2016-05-29 DIAGNOSIS — N390 Urinary tract infection, site not specified: Secondary | ICD-10-CM

## 2016-05-29 DIAGNOSIS — I480 Paroxysmal atrial fibrillation: Secondary | ICD-10-CM | POA: Diagnosis not present

## 2016-05-29 DIAGNOSIS — R531 Weakness: Secondary | ICD-10-CM

## 2016-05-29 DIAGNOSIS — Z803 Family history of malignant neoplasm of breast: Secondary | ICD-10-CM | POA: Diagnosis not present

## 2016-05-29 DIAGNOSIS — I7389 Other specified peripheral vascular diseases: Secondary | ICD-10-CM | POA: Diagnosis present

## 2016-05-29 DIAGNOSIS — R4182 Altered mental status, unspecified: Secondary | ICD-10-CM | POA: Diagnosis not present

## 2016-05-29 DIAGNOSIS — E785 Hyperlipidemia, unspecified: Secondary | ICD-10-CM | POA: Diagnosis not present

## 2016-05-29 DIAGNOSIS — A419 Sepsis, unspecified organism: Secondary | ICD-10-CM | POA: Diagnosis not present

## 2016-05-29 DIAGNOSIS — Z808 Family history of malignant neoplasm of other organs or systems: Secondary | ICD-10-CM | POA: Diagnosis not present

## 2016-05-29 LAB — CBC
HCT: 32.1 % — ABNORMAL LOW (ref 36.0–46.0)
Hemoglobin: 10.7 g/dL — ABNORMAL LOW (ref 12.0–15.0)
MCH: 30.8 pg (ref 26.0–34.0)
MCHC: 33.3 g/dL (ref 30.0–36.0)
MCV: 92.5 fL (ref 78.0–100.0)
PLATELETS: 154 10*3/uL (ref 150–400)
RBC: 3.47 MIL/uL — AB (ref 3.87–5.11)
RDW: 13.2 % (ref 11.5–15.5)
WBC: 7 10*3/uL (ref 4.0–10.5)

## 2016-05-29 LAB — BASIC METABOLIC PANEL
Anion gap: 13 (ref 5–15)
BUN: 15 mg/dL (ref 6–20)
CHLORIDE: 100 mmol/L — AB (ref 101–111)
CO2: 22 mmol/L (ref 22–32)
CREATININE: 1.06 mg/dL — AB (ref 0.44–1.00)
Calcium: 8.9 mg/dL (ref 8.9–10.3)
GFR calc Af Amer: 57 mL/min — ABNORMAL LOW (ref >60)
GFR, EST NON AFRICAN AMERICAN: 49 mL/min — AB (ref >60)
GLUCOSE: 148 mg/dL — AB (ref 65–99)
POTASSIUM: 3.4 mmol/L — AB (ref 3.5–5.1)
Sodium: 135 mmol/L (ref 135–145)

## 2016-05-29 MED ORDER — PANTOPRAZOLE SODIUM 40 MG PO TBEC
40.0000 mg | DELAYED_RELEASE_TABLET | Freq: Every day | ORAL | Status: DC
  Administered 2016-05-29 – 2016-05-31 (×3): 40 mg via ORAL
  Filled 2016-05-29 (×3): qty 1

## 2016-05-29 MED ORDER — ACETAMINOPHEN 325 MG PO TABS
650.0000 mg | ORAL_TABLET | Freq: Once | ORAL | Status: AC
  Administered 2016-05-29: 650 mg via ORAL
  Filled 2016-05-29: qty 2

## 2016-05-29 MED ORDER — DEXTROSE 5 % IV SOLN
1.0000 g | INTRAVENOUS | Status: DC
  Administered 2016-05-29 – 2016-05-30 (×2): 1 g via INTRAVENOUS
  Filled 2016-05-29 (×3): qty 10

## 2016-05-29 MED ORDER — SODIUM CHLORIDE 0.9 % IV SOLN
INTRAVENOUS | Status: DC
  Administered 2016-05-29 – 2016-05-30 (×2): 1000 mL via INTRAVENOUS

## 2016-05-29 MED ORDER — VENLAFAXINE HCL ER 150 MG PO CP24
150.0000 mg | ORAL_CAPSULE | Freq: Every day | ORAL | Status: DC
  Administered 2016-05-29 – 2016-05-31 (×3): 150 mg via ORAL
  Filled 2016-05-29: qty 1
  Filled 2016-05-29 (×2): qty 2
  Filled 2016-05-29 (×2): qty 1

## 2016-05-29 MED ORDER — SODIUM CHLORIDE 0.9 % IV BOLUS (SEPSIS)
500.0000 mL | Freq: Once | INTRAVENOUS | Status: AC
  Administered 2016-05-29: 500 mL via INTRAVENOUS

## 2016-05-29 MED ORDER — ONDANSETRON HCL 4 MG/2ML IJ SOLN
4.0000 mg | Freq: Four times a day (QID) | INTRAMUSCULAR | Status: DC | PRN
Start: 2016-05-29 — End: 2016-05-31

## 2016-05-29 MED ORDER — SODIUM CHLORIDE 0.9 % IV SOLN
INTRAVENOUS | Status: DC
Start: 2016-05-29 — End: 2016-05-29
  Administered 2016-05-29 (×2): via INTRAVENOUS

## 2016-05-29 MED ORDER — SODIUM CHLORIDE 0.9% FLUSH
3.0000 mL | Freq: Two times a day (BID) | INTRAVENOUS | Status: DC
  Administered 2016-05-29 – 2016-05-31 (×3): 3 mL via INTRAVENOUS

## 2016-05-29 MED ORDER — ACETAMINOPHEN 650 MG RE SUPP: 650.0000 mg | Freq: Four times a day (QID) | RECTAL | Status: DC | PRN

## 2016-05-29 MED ORDER — RIVAROXABAN 20 MG PO TABS
20.0000 mg | ORAL_TABLET | Freq: Every day | ORAL | Status: DC
  Administered 2016-05-29 – 2016-05-30 (×2): 20 mg via ORAL
  Filled 2016-05-29 (×2): qty 1

## 2016-05-29 MED ORDER — ACETAMINOPHEN 500 MG PO TABS
500.0000 mg | ORAL_TABLET | ORAL | Status: DC | PRN
  Administered 2016-05-29: 500 mg via ORAL
  Filled 2016-05-29: qty 1

## 2016-05-29 MED ORDER — NADOLOL 20 MG PO TABS
10.0000 mg | ORAL_TABLET | Freq: Every day | ORAL | Status: DC
  Filled 2016-05-29: qty 1

## 2016-05-29 MED ORDER — RIVAROXABAN 20 MG PO TABS: 20.0000 mg | ORAL_TABLET | Freq: Every day | ORAL | Status: DC

## 2016-05-29 MED ORDER — ATORVASTATIN CALCIUM 10 MG PO TABS
10.0000 mg | ORAL_TABLET | Freq: Every day | ORAL | Status: DC
  Administered 2016-05-29 – 2016-05-30 (×2): 10 mg via ORAL
  Filled 2016-05-29 (×2): qty 1

## 2016-05-29 MED ORDER — ONDANSETRON HCL 4 MG PO TABS: 4.0000 mg | ORAL_TABLET | Freq: Four times a day (QID) | ORAL | Status: DC | PRN

## 2016-05-29 MED ORDER — LEVOTHYROXINE SODIUM 75 MCG PO TABS
75.0000 ug | ORAL_TABLET | Freq: Every day | ORAL | Status: DC
  Administered 2016-05-29 – 2016-05-31 (×3): 75 ug via ORAL
  Filled 2016-05-29 (×3): qty 1

## 2016-05-29 MED ORDER — ACETAMINOPHEN 325 MG PO TABS
650.0000 mg | ORAL_TABLET | Freq: Four times a day (QID) | ORAL | Status: DC | PRN
  Administered 2016-05-29: 650 mg via ORAL
  Filled 2016-05-29: qty 2

## 2016-05-29 MED ORDER — NADOLOL 20 MG PO TABS
10.0000 mg | ORAL_TABLET | Freq: Every day | ORAL | Status: DC
Start: 2016-05-30 — End: 2016-05-31
  Administered 2016-05-30 – 2016-05-31 (×2): 10 mg via ORAL
  Filled 2016-05-29 (×2): qty 1

## 2016-05-29 NOTE — ED Notes (Signed)
Pt has HA, gave pt tylenol

## 2016-05-29 NOTE — Plan of Care (Signed)
Pt in room 532 from ED. Alert and oriented x4, with periods of forgetfulness.Moe x4 no skin breakdown noted. Daughter [name] was in room and helped answer most admission questions.Pt ambulates however slightly weak and needs SBA. Pt demonstrated [proper use of call bell and verbalized understanding of safety precautions.Bed alarm on and fall risk bracelet on pt.

## 2016-05-29 NOTE — ED Notes (Signed)
Call to 5w to give report

## 2016-05-29 NOTE — Progress Notes (Signed)
ANTICOAGULATION CONSULT NOTE - Initial Consult  Pharmacy Consult for Xarelto Indication: atrial fibrillation  Allergies  Allergen Reactions  . Dyazide [Hydrochlorothiazide W-Triamterene]     Presumption: drug induced vasculitis  . Niacin Other (See Comments)    flushing  . Penicillins Diarrhea and Nausea And Vomiting    Patient Measurements: Height: 5\' 6"  (167.6 cm) Weight: 143 lb 12.8 oz (65.2 kg) IBW/kg (Calculated) : 59.3  Vital Signs: Temp: 99.1 F (37.3 C) (03/21 0610) Temp Source: Oral (03/21 0610) BP: 89/57 (03/21 0610) Pulse Rate: 81 (03/21 0610)  Labs:  Recent Labs  05/28/16 2207  HGB 12.0  HCT 36.3  PLT 151  CREATININE 1.04*    Estimated Creatinine Clearance: 42.4 mL/min (A) (by C-G formula based on SCr of 1.04 mg/dL (H)).   Medical History: Past Medical History:  Diagnosis Date  . Anemia    from medicaions  . Arthritis   . Chronic kidney disease    some renal impairment from meds  . Depression    states no depression  . Eczema   . Esophageal stricture   . GERD (gastroesophageal reflux disease)   . Headache   . Heart murmur    states it is benign told 30-40 years ago  . History of DVT (deep vein thrombosis)   . HX: breast cancer    melanoma  . Hyperlipidemia   . Hypertension   . Hypothyroidism   . Melanoma ([place])   . OA (osteoarthritis)   . Pneumothorax on right 06/15/2014  . PONV (postoperative nausea and vomiting)   . Wears glasses     Medications:  Prescriptions Prior to Admission  Medication Sig Dispense Refill Last Dose  . acetaminophen (TYLENOL) 500 MG tablet Take 500 mg by mouth 2 (two) times daily as needed for mild pain. Reported on 07/07/2015   unk  . atorvastatin (LIPITOR) 10 MG tablet Take 1 tablet (10 mg total) by mouth daily. 90 tablet 3 05/27/2016 at Unknown time  . B Complex-C-E-Zn (B COMPLEX-C-E-ZINC) tablet Take 1 tablet by mouth daily.   05/27/2016 at Unknown time  . B Complex-C-Folic Acid (STRESS FORMULA PO) Take 1  tablet by mouth every morning.    05/28/2016 at Unknown time  . Calcium-Vitamin D (CALTRATE 600 PLUS-VIT D PO) Take 1 tablet by mouth 2 (two) times daily.    05/28/2016 at Unknown time  . ciclopirox (PENLAC) 8 % solution APPLY ON THE AFFECTED NAILS BID UTD  5 05/27/2016 at Unknown time  . esomeprazole (NEXIUM) 40 MG capsule TAKE 1 BY MOUTH DAILY 90 capsule 2 05/28/2016 at Unknown time  . fexofenadine (ALLEGRA) 180 MG tablet Take 180 mg by mouth daily as needed for allergies.    unk  . hypromellose (SYSTANE OVERNIGHT THERAPY) 0.3 % GEL ophthalmic ointment Place 1 application into both eyes at bedtime as needed for dry eyes.   unk  . ipratropium (ATROVENT) 0.03 % nasal spray Place 2 sprays into the nose every 12 (twelve) hours. 180 mL 3 05/28/2016 at Unknown time  . Lactobacillus-Inulin (CULTURELLE DIGESTIVE HEALTH PO) Take 1 capsule by mouth daily.   05/28/2016 at Unknown time  . levothyroxine (SYNTHROID, LEVOTHROID) 75 MCG tablet Take 1 tablet (75 mcg total) by mouth daily. 30 tablet 0 05/28/2016 at Unknown time  . nadolol (CORGARD) 20 MG tablet TAKE 1/2 TABLET(10 MG) BY MOUTH DAILY 45 tablet 2 05/28/2016 at 0830  . nitrofurantoin, macrocrystal-monohydrate, (MACROBID) 100 MG capsule Take 100 mg by mouth 2 (two) times daily.   05/28/2016  at am  . Propylene Glycol (SYSTANE BALANCE) 0.6 % SOLN Apply 1-2 drops to eye daily as needed (dry eyes).    unk  . rivaroxaban (XARELTO) 20 MG TABS tablet Take 1 tablet (20 mg total) by mouth daily with supper. 90 tablet 3 05/27/2016 at Unknown time  . venlafaxine XR (EFFEXOR-XR) 150 MG 24 hr capsule TAKE 1 CAPSULE(150 MG) BY MOUTH DAILY 90 capsule 2 05/28/2016 at Unknown time    Assessment: 76 y.o. F presents with dysuria, mild confusion, and falls. Treating for UTI. Pt on Xarelto PTA for afib. CBC ok on admission.  Goal of Therapy:  Prevention of stroke Monitor platelets by anticoagulation protocol: Yes   Plan:  Xarelto 20mg  po daily with supper Will f/u CBC and  renal function F/u s/s bleeding  Sherlon Handing, PharmD, BCPS Clinical pharmacist, pager [phone] 05/29/2016,6:29 AM

## 2016-05-29 NOTE — Progress Notes (Signed)
COURTESY NOTE: Appreciate learning of patient's admission, which does not seem to be related to her history of breast cancer,summarized below. She has f/u with [place] 07/02/2016. Please let me know if we can be of any help.  GM   76 y.o. [place] woman  (1) status post right modified radical mastectomy November 1998 for a pT2 pN0, stage IIA invasive ductal carcinoma, grade 2, estrogen and progesterone receptor positive, HER-2 not amplified             (a) adjuvant chemotherapy consisted of doxorubicin and cyclophosphamide 4             (b) adjuvant anti-estrogens consisted of tamoxifen, then letrozole, for a total of 5 years  (2) status post left breast lower outer quadrant biopsy 04/07/2014 for a clinical T1c N0,stage IA  invasive ductal carcinoma, with squamous features, estrogen and progesterone receptor negative, HER-2 not amplified, with an MIB-1 of 10%   (3) left lumpectomy and sentinel lymph node biopsy 05/05/2014 showed aT1c pN0, stage IA invasive ductal carcinoma, grade 1, triple negative, with close but negative margins.   (4) Mammaprint classifies the tumor as basal like, high risk, and predicts a 30% chance of recurrence within 10 years with local treatment only.   (5) start of treatment delayed because of bilateral pneumothoraces after port placement. Adjuvant chemotherapy fiinally started 07/11/2014 consisting of carboplatin and docetaxel given every 21 days 4, completed 09/13/2014  (6) adjuvant radiation 10/31/2014-12/15/2014:  Left breast/ 45 Gy at 1.8 Gy per fraction x 25 fractions.  Left breast boost/ 16 Gy at 2 Gy per fraction x 8 fractions  (7) genetics testing March 2016 showed no deleterious mutation in the [employer] panel  [[employer] [employer]] including sequencing and rearrangement analysis for the following 24 genes:ATM, BARD1, BRCA1, BRCA2, BRIP1, CDH1, CHEK2, EPCAM, MLH1, MRE11A, MSH2, MSH6, MUTYH, NBN, NF1, PALB2, PMS2, PTEN, RAD50, RAD51C, RAD51D, SMARCA4, STK11,  and TP53.              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   (8) osteopenia by bone density 06/29/2015 with [place] T score of -2.4             (a) intolerant of Fosamax

## 2016-05-29 NOTE — Progress Notes (Signed)
ANTICOAGULATION CONSULT NOTE - [place] for Xarelto Indication: DVT (correction - for VTE, not afib)  Allergies  Allergen Reactions  . Dyazide [Hydrochlorothiazide W-Triamterene]     Presumption: drug induced vasculitis  . Niacin Other (See Comments)    flushing  . Penicillins Diarrhea and Nausea And Vomiting    Patient Measurements: Height: 5\' 6"  (167.6 cm) Weight: 143 lb 12.8 oz (65.2 kg) IBW/kg (Calculated) : 59.3  Vital Signs: Temp: 99.1 F (37.3 C) (03/21 0610) Temp Source: Oral (03/21 0610) BP: 89/57 (03/21 0610) Pulse Rate: 81 (03/21 0610)  Labs:  Recent Labs  05/28/16 2207 05/29/16 0941  HGB 12.0 10.7*  HCT 36.3 32.1*  PLT 151 154  CREATININE 1.04* 1.06*    Estimated Creatinine Clearance: 41.6 mL/min (A) (by C-G formula based on SCr of 1.06 mg/dL (H)).   Medical History: Past Medical History:  Diagnosis Date  . Anemia    from medicaions  . Arthritis   . Chronic kidney disease    some renal impairment from meds  . Depression    states no depression  . Eczema   . Esophageal stricture   . GERD (gastroesophageal reflux disease)   . Headache   . Heart murmur    states it is benign told 30-40 years ago  . History of DVT (deep vein thrombosis)   . HX: breast cancer    melanoma  . Hyperlipidemia   . Hypertension   . Hypothyroidism   . Melanoma ([place])   . OA (osteoarthritis)   . Pneumothorax on right 06/15/2014  . PONV (postoperative nausea and vomiting)   . Wears glasses     Medications:  Prescriptions Prior to Admission  Medication Sig Dispense Refill Last Dose  . acetaminophen (TYLENOL) 500 MG tablet Take 500 mg by mouth 2 (two) times daily as needed for mild pain. Reported on 07/07/2015   unk  . atorvastatin (LIPITOR) 10 MG tablet Take 1 tablet (10 mg total) by mouth daily. 90 tablet 3 05/27/2016 at Unknown time  . B Complex-C-E-Zn (B COMPLEX-C-E-ZINC) tablet Take 1 tablet by mouth daily.   05/27/2016 at Unknown time   . B Complex-C-Folic Acid (STRESS FORMULA PO) Take 1 tablet by mouth every morning.    05/28/2016 at Unknown time  . Calcium-Vitamin D (CALTRATE 600 PLUS-VIT D PO) Take 1 tablet by mouth 2 (two) times daily.    05/28/2016 at Unknown time  . ciclopirox (PENLAC) 8 % solution APPLY ON THE AFFECTED NAILS BID UTD  5 05/27/2016 at Unknown time  . esomeprazole (NEXIUM) 40 MG capsule TAKE 1 BY MOUTH DAILY 90 capsule 2 05/28/2016 at Unknown time  . fexofenadine (ALLEGRA) 180 MG tablet Take 180 mg by mouth daily as needed for allergies.    unk  . hypromellose (SYSTANE OVERNIGHT THERAPY) 0.3 % GEL ophthalmic ointment Place 1 application into both eyes at bedtime as needed for dry eyes.   unk  . ipratropium (ATROVENT) 0.03 % nasal spray Place 2 sprays into the nose every 12 (twelve) hours. 180 mL 3 05/28/2016 at Unknown time  . Lactobacillus-Inulin (CULTURELLE DIGESTIVE HEALTH PO) Take 1 capsule by mouth daily.   05/28/2016 at Unknown time  . levothyroxine (SYNTHROID, LEVOTHROID) 75 MCG tablet Take 1 tablet (75 mcg total) by mouth daily. 30 tablet 0 05/28/2016 at Unknown time  . nadolol (CORGARD) 20 MG tablet TAKE 1/2 TABLET(10 MG) BY MOUTH DAILY 45 tablet 2 05/28/2016 at 0830  . nitrofurantoin, macrocrystal-monohydrate, (MACROBID) 100 MG capsule Take  100 mg by mouth 2 (two) times daily.   05/28/2016 at am  . Propylene Glycol (SYSTANE BALANCE) 0.6 % SOLN Apply 1-2 drops to eye daily as needed (dry eyes).    unk  . rivaroxaban (XARELTO) 20 MG TABS tablet Take 1 tablet (20 mg total) by mouth daily with supper. 90 tablet 3 05/27/2016 at Unknown time  . venlafaxine XR (EFFEXOR-XR) 150 MG 24 hr capsule TAKE 1 CAPSULE(150 MG) BY MOUTH DAILY 90 capsule 2 05/28/2016 at Unknown time    Assessment: 76 y.o. F presents with dysuria, mild confusion, and falls. Started on Rocephin for treatment of UTI.  Pt on Xarelto PTA for hx DVT. CrCl (TBW) = 45 ml/min.  CBC ok on admission.  Goal of Therapy:  Therapeutic  anticoagulation Monitor platelets by anticoagulation protocol: Yes   Plan:  Xarelto 20mg  po daily with supper - dose ok for VTE indication Will f/u CBC and renal function F/u s/s bleeding  [employer], Pharm.D., BCPS Clinical Pharmacist Pager [phone] 05/29/2016 2:00 PM

## 2016-05-29 NOTE — H&P (Signed)
History and Physical    Laura Li BTD:[id number] DOB: 1941/02/03 DOA: 05/28/2016  PCP: [name], MD   Patient coming from: Home  Chief Complaint: Dysuria, generalized weakness, mild confusion, falls  HPI: Laura Li is a 76 y.o. woman with a history of HTN, HLD, breast cancer, and prior UTI who is accompanied by her daughter in the ED for evaluation of new onset confusion several hours prior to presentation.  She has actually had LUTs and pelvic pain since Friday.  She did not call her outpatient provider to report symptoms until Monday.  It appears that she was given a prescription for macrobid and has only taken a single dose.  Since the weekend, she has had generalized weakness with falls.  No LOC, and she is typically able to get herself up with increased effort.  Today, friends and family noticed increased confusion; the patient apparently commented that she could tell that she was not thinking clearly.  Her daughter was worried about the progression of symptoms, all attributed to UTI at this point, and urged her to come in.  No fever.  Mild nausea, but no vomiting.  Appetite has been stable.  No LOC.  No diarrhea or constipation.  ED Course: Chest xray negative for acute process.  Head CT negative for traumatic intracranial injury or fracture.  No ischemic changes on EKG.  WBC count 10.9.  U/A shows moderate leukocytes, 6-30 WBC, but no bacteria.  Hospitalist asked to place in observation.  Review of Systems: As per HPI otherwise 10 systems reviewed and negative.   Past Medical History:  Diagnosis Date  . Anemia    from medicaions  . Arthritis   . Chronic kidney disease    some renal impairment from meds  . Depression    states no depression  . Eczema   . Esophageal stricture   . GERD (gastroesophageal reflux disease)   . Headache   . Heart murmur    states it is benign told 30-40 years ago  . History of DVT (deep vein thrombosis)   . HX: breast cancer    melanoma  . Hyperlipidemia   . Hypertension   . Hypothyroidism   . Melanoma ([place])   . OA (osteoarthritis)   . Pneumothorax on right 06/15/2014  . PONV (postoperative nausea and vomiting)   . Wears glasses     Past Surgical History:  Procedure Laterality Date  . APPENDECTOMY  1967  . BREAST LUMPECTOMY     left breast-benign  . BREAST SURGERY  1977   removal of calcified milk gland right breast  . CHEST TUBE INSERTION  06/15/2014  . CHEST TUBE INSERTION Bilateral 06/15/2014   Procedure: CHEST TUBE INSERTION;  Surgeon: Rolm Bookbinder, MD;  Location: [facility];  Service: General;  Laterality: Bilateral;  . COLONOSCOPY    . LIPOMA EXCISION  1978   right breast  . MASTECTOMY  1998   right-nodes out  . MELANOMA EXCISION  2004   right side of face  . MOHS SURGERY  2005   right left-basal cell  . PORT-A-CATH REMOVAL N/A 10/05/2014   Procedure: REMOVAL PORT-A-CATH;  Surgeon: Rolm Bookbinder, MD;  Location: [facility];  Service: General;  Laterality: N/A;  . PORTACATH PLACEMENT Left 06/15/2014   Procedure: INSERTION PORT-A-CATH;  Surgeon: Rolm Bookbinder, MD;  Location: [facility];  Service: General;  Laterality: Left;  . RADIOACTIVE SEED GUIDED MASTECTOMY WITH AXILLARY SENTINEL LYMPH NODE BIOPSY Left 05/05/2014   Procedure: RADIOACTIVE SEED  GUIDED LEFT LUMPECTOMY WITH AXILLARY SENTINEL LYMPH NODE BIOPSY;  Surgeon: Rolm Bookbinder, MD;  Location: [facility];  Service: General;  Laterality: Left;  . TONSILLECTOMY AND ADENOIDECTOMY  1949  . TOTAL ABDOMINAL HYSTERECTOMY  1992   including ovaries     reports that she quit smoking about 47 years ago. Her smoking use included Cigarettes. She has a 2.25 pack-year smoking history. She has never used smokeless tobacco. She reports that she drinks alcohol. She reports that she does not use drugs. Still lives independently.  Still drives.  Allergies  Allergen Reactions  . Dyazide [Hydrochlorothiazide W-Triamterene]      Presumption: drug induced vasculitis  . Niacin Other (See Comments)    flushing  . Penicillins Diarrhea and Nausea And Vomiting    Family History  Problem Relation Age of Onset  . Heart disease Father 55    smoker  . Endometrial cancer Mother 64  . Breast cancer Cousin     x 3 cousins with breast cancer (one at 80, one at 67 and one at 47)  . Lung cancer Paternal Aunt     smoker  . Brain cancer Maternal Aunt   . Diabetes Maternal Aunt     ???  . Breast cancer Maternal Aunt 51  . Colon cancer Neg Hx      Prior to Admission medications   Medication Sig Start Date End Date Taking? Authorizing Provider  acetaminophen (TYLENOL) 500 MG tablet Take 500 mg by mouth 2 (two) times daily as needed for mild pain. Reported on 07/07/2015   Yes Historical Provider, MD  atorvastatin (LIPITOR) 10 MG tablet Take 1 tablet (10 mg total) by mouth daily. 12/05/15  Yes Marin Olp, MD  B Complex-C-E-Zn (B COMPLEX-C-E-ZINC) tablet Take 1 tablet by mouth daily.   Yes Historical Provider, MD  B Complex-C-Folic Acid (STRESS FORMULA PO) Take 1 tablet by mouth every morning.    Yes Historical Provider, MD  Calcium-Vitamin D (CALTRATE 600 PLUS-VIT D PO) Take 1 tablet by mouth 2 (two) times daily.    Yes Historical Provider, MD  ciclopirox (PENLAC) 8 % solution APPLY ON THE AFFECTED NAILS BID UTD 05/20/16  Yes Historical Provider, MD  esomeprazole (NEXIUM) 40 MG capsule TAKE 1 BY MOUTH DAILY 12/14/15  Yes Marin Olp, MD  fexofenadine (ALLEGRA) 180 MG tablet Take 180 mg by mouth daily as needed for allergies.    Yes Historical Provider, MD  hypromellose (SYSTANE OVERNIGHT THERAPY) 0.3 % GEL ophthalmic ointment Place 1 application into both eyes at bedtime as needed for dry eyes.   Yes Historical Provider, MD  ipratropium (ATROVENT) 0.03 % nasal spray Place 2 sprays into the nose every 12 (twelve) hours. 09/07/15  Yes Marin Olp, MD  Lactobacillus-Inulin ([place] PO) Take 1 capsule  by mouth daily.   Yes Historical Provider, MD  levothyroxine (SYNTHROID, LEVOTHROID) 75 MCG tablet Take 1 tablet (75 mcg total) by mouth daily. 04/05/16  Yes Marin Olp, MD  nadolol (CORGARD) 20 MG tablet TAKE 1/2 TABLET(10 MG) BY MOUTH DAILY 05/09/16  Yes Eulas Post, MD  nitrofurantoin, macrocrystal-monohydrate, (MACROBID) 100 MG capsule Take 100 mg by mouth 2 (two) times daily.   Yes Historical Provider, MD  Propylene Glycol (SYSTANE BALANCE) 0.6 % SOLN Apply 1-2 drops to eye daily as needed (dry eyes).    Yes Historical Provider, MD  rivaroxaban (XARELTO) 20 MG TABS tablet Take 1 tablet (20 mg total) by mouth daily with supper. 07/07/15  Yes Marin Olp, MD  venlafaxine XR (EFFEXOR-XR) 150 MG 24 hr capsule TAKE 1 CAPSULE(150 MG) BY MOUTH DAILY 05/09/16  Yes Eulas Post, MD    Physical Exam: Vitals:   05/28/16 2015 05/28/16 2017 05/28/16 2022  BP:  136/72   Pulse: 94    Resp: 18    Temp:   99.5 F (37.5 C)  TempSrc:   Oral  SpO2: 100%    Weight: 67.1 kg (148 lb)    Height: 5\' 5"  (1.651 m)        Constitutional: NAD, calm, comfortable, NONtoxic appearing Vitals:   05/28/16 2015 05/28/16 2017 05/28/16 2022  BP:  136/72   Pulse: 94    Resp: 18    Temp:   99.5 F (37.5 C)  TempSrc:   Oral  SpO2: 100%    Weight: 67.1 kg (148 lb)    Height: 5\' 5"  (1.651 m)     Eyes: PERRL, lids and conjunctivae normal ENMT: Mucous membranes are moist. Posterior pharynx clear of any exudate or lesions. Normal dentition.  Neck: normal appearance, supple, no masses Respiratory: clear to auscultation bilaterally, no wheezing, no crackles. Normal respiratory effort. No accessory muscle use.  Cardiovascular: Normal rate, regular rhythm, + murmur I-II/VI systolic.  No rubs / gallops. No extremity edema. 2+ pedal pulses. GI: abdomen is soft and compressible.  She has pelvic fullness suggestive of bladder distention but no tenderness.  No masses palpated.  Bowel sounds are  hypoactive. Musculoskeletal:  No joint deformity in upper and lower extremities. Good ROM, no contractures. Normal muscle tone.  Skin: no rashes, warm and dry Neurologic: CN 2-12 grossly intact. Sensation intact, Strength symmetric bilaterally, 5/5.  Psychiatric: Normal judgment and insight. Alert and oriented x 3. Normal mood.     Labs on Admission: I have personally reviewed following labs and imaging studies  CBC:  Recent Labs Lab 05/28/16 2207  WBC 10.9*  NEUTROABS 10.2*  HGB 12.0  HCT 36.3  MCV 91.9  PLT 294   Basic Metabolic Panel:  Recent Labs Lab 05/28/16 2207  NA 134*  K 4.2  CL 97*  CO2 25  GLUCOSE 123*  BUN 20  CREATININE 1.04*  CALCIUM 9.6   GFR: Estimated Creatinine Clearance: 40.8 mL/min (A) (by C-G formula based on SCr of 1.04 mg/dL (H)). Liver Function Tests:  Recent Labs Lab 05/28/16 2207  AST 38  ALT 19  ALKPHOS 49  BILITOT 1.1  PROT 6.9  ALBUMIN 4.3   Urine analysis:    Component Value Date/Time   COLORURINE YELLOW 05/28/2016 2212   APPEARANCEUR CLEAR 05/28/2016 2212   LABSPEC 1.014 05/28/2016 2212   LABSPEC 1.005 08/09/2014 1052   PHURINE 7.0 05/28/2016 2212   GLUCOSEU NEGATIVE 05/28/2016 2212   GLUCOSEU Negative 08/09/2014 1052   HGBUR NEGATIVE 05/28/2016 2212   BILIRUBINUR NEGATIVE 05/28/2016 2212   BILIRUBINUR Negative 08/09/2014 1052   KETONESUR NEGATIVE 05/28/2016 2212   PROTEINUR NEGATIVE 05/28/2016 2212   UROBILINOGEN 0.2 08/09/2014 1052   NITRITE NEGATIVE 05/28/2016 2212   LEUKOCYTESUR MODERATE (A) 05/28/2016 2212   LEUKOCYTESUR Negative 08/09/2014 1052    Radiological Exams on Admission: Dg Chest 2 View  Result Date: 05/28/2016 CLINICAL DATA:  Fall and confusion. Daughter says patient has been falling and has been confused over the last couple days, Pt. Denies any chest pain or SOB. EXAM: CHEST  2 VIEW COMPARISON:  06/22/2014; 06/15/2014 FINDINGS: Grossly unchanged cardiac silhouette and mediastinal contours.  Interval removal of left subclavian vein  approach port a catheter. Improved inspiratory effort. A cardiac pad overlies the right lung apex. No focal airspace opacities. No pleural effusion or pneumothorax. No evidence of edema. No acute osseus abnormalities. Surgical clips overlie the left breast. Post right-sided mastectomy. No acute osseus abnormalities. IMPRESSION: No acute cardiopulmonary disease. Electronically Signed   By: Sandi Mariscal M.D.   On: 05/28/2016 20:44    EKG: Independently reviewed by me.  Noted above.  Assessment/Plan Principal Problem:   Generalized weakness Active Problems:   Hypothyroidism   Essential hypertension   Chronic kidney disease, stage III (moderate)   Acute lower UTI   Falls   Acute encephalopathy     Acute encephalopathy secondary to UTI --IV rocephin --Blood and urine cultures --NS at 50cc/hr --Check bladder scan for urinary retention; may need in and out cath  HTN --Nadolol  HLD --Statin  Depression --Effexor  Hypothyroidism --levothyroxine  DVT prophylaxis: Low risk, outpatient status Code Status: FULL Family Communication: Daughter present in the ED at time of admission. Disposition Plan: Expect she will go home at discharge. Consults called: NONE Admission status: place in observation with telemetry monitoring.   TIME SPENT: 50 minutes   Eber Jones MD [facility] Pager [phone]  If 7PM-7AM, please contact night-coverage [email or web address] Password [email or web address]  05/29/2016, 1:01 AM

## 2016-05-29 NOTE — Progress Notes (Signed)
PROGRESS NOTE                                                                                                                                                                                                             Patient Demographics:    Laura Li, is a 76 y.o. female, DOB - 10-22-1940, STM:[id number]  Admit date - 05/28/2016   Admitting Physician Lily Kocher, MD  Outpatient Primary MD for the patient is [name], MD  LOS - 0  Chief Complaint  Patient presents with  . Fall  . Altered Mental Status       Brief Narrative  Laura Li is a 76 y.o. woman with a history of HTN, HLD, breast cancer, and prior UTI who is accompanied by her daughter in the ED for evaluation of new onset confusion several hours prior to presentation, her workup showed that she had toxic encephalopathy coming from UTI.   Subjective:    Laura Li today has, No headache, No chest pain, No abdominal pain - No Nausea, No new weakness tingling or numbness, No Cough - SOB.     Assessment  & Plan :      1.Sepsis, toxic encephalopathy both arising from UTI. Patient has been placed on IV Rocephin, cultures so far are negative, continue hydration with IV fluids, mentation has improved, head CT unremarkable. Close to her baseline. Continue to hydrate and monitor cultures.  2. Dyslipidemia. On statin continue.  3. GERD. On PPI.  4. Hypothyroidism. Continue home dose Synthroid.  5. History of breast cancer and DVT. Follow with Dr. Jana Li post [place], continue Jennye Moccasin.    Diet : Diet Heart Room service appropriate? Yes; Fluid consistency: Thin    Family Communication  :  None  Code Status :  Full  Disposition Plan  :  TBD  Consults  :  None  Procedures  :    CT head. Nonacute  DVT Prophylaxis  : [place]  Lab Results  Component Value Date   PLT 151 05/28/2016    Inpatient Medications  Scheduled Meds: .  atorvastatin  10 mg Oral q1800  . cefTRIAXone (ROCEPHIN)  IV  1 g Intravenous Q24H  . levothyroxine  75 mcg Oral QAC breakfast  . [START ON 05/30/2016] nadolol  10 mg Oral Daily  . pantoprazole  40 mg Oral  Daily  . rivaroxaban  20 mg Oral Q supper  . sodium chloride  500 mL Intravenous Once  . sodium chloride flush  3 mL Intravenous Q12H  . venlafaxine XR  150 mg Oral Q breakfast   Continuous Infusions: . sodium chloride     PRN Meds:.[DISCONTINUED] ondansetron **OR** ondansetron (ZOFRAN) IV  Antibiotics  :    Anti-infectives    Start     Dose/Rate Route Frequency Ordered Stop   05/29/16 2200  cefTRIAXone (ROCEPHIN) 1 g in dextrose 5 % 50 mL IVPB     1 g 100 mL/hr over 30 Minutes Intravenous Every 24 hours 05/29/16 0650     05/28/16 2315  cefTRIAXone (ROCEPHIN) 1 g in dextrose 5 % 50 mL IVPB     1 g 100 mL/hr over 30 Minutes Intravenous  Once 05/28/16 2308 05/29/16 0054         Objective:   Vitals:   05/28/16 2017 05/28/16 2022 05/29/16 0156 05/29/16 0610  BP: 136/72  (!) 101/45 (!) 89/57  Pulse:   77 81  Resp:   20 20  Temp:  99.5 F (37.5 C) 99 F (37.2 C) 99.1 F (37.3 C)  TempSrc:  Oral Oral Oral  SpO2:   97% 96%  Weight:   65.2 kg (143 lb 12.8 oz)   Height:   5\' 6"  (1.676 m)     Wt Readings from Last 3 Encounters:  05/29/16 65.2 kg (143 lb 12.8 oz)  05/23/16 67.1 kg (148 lb)  04/05/16 66.4 kg (146 lb 6.4 oz)     Intake/Output Summary (Last 24 hours) at 05/29/16 1029 Last data filed at 05/29/16 1019  Gross per 24 hour  Intake             1430 ml  Output             1000 ml  Net              430 ml     Physical Exam  Awake Alert, Oriented X 3, No new F.N deficits, Normal affect [place].AT,PERRAL Supple Neck,No JVD, No cervical lymphadenopathy appriciated.  Symmetrical Chest wall movement, Good air movement bilaterally, CTAB RRR,No Gallops,Rubs or new Murmurs, No Parasternal Heave +ve B.Sounds, Abd Soft, No tenderness, No organomegaly appriciated, No  rebound - guarding or rigidity. No Cyanosis, Clubbing or edema, No new Rash or bruise       Data Review:    CBC  Recent Labs Lab 05/28/16 2207  WBC 10.9*  HGB 12.0  HCT 36.3  PLT 151  MCV 91.9  MCH 30.4  MCHC 33.1  RDW 13.0  LYMPHSABS 0.3*  MONOABS 0.4  EOSABS 0.0  BASOSABS 0.0    Chemistries   Recent Labs Lab 05/28/16 2207  NA 134*  K 4.2  CL 97*  CO2 25  GLUCOSE 123*  BUN 20  CREATININE 1.04*  CALCIUM 9.6  AST 38  ALT 19  ALKPHOS 49  BILITOT 1.1   ------------------------------------------------------------------------------------------------------------------ No results for input(s): CHOL, HDL, LDLCALC, TRIG, CHOLHDL, LDLDIRECT in the last 72 hours.  No results found for: HGBA1C ------------------------------------------------------------------------------------------------------------------ No results for input(s): TSH, T4TOTAL, T3FREE, THYROIDAB in the last 72 hours.  Invalid input(s): FREET3 ------------------------------------------------------------------------------------------------------------------ No results for input(s): VITAMINB12, FOLATE, FERRITIN, TIBC, IRON, RETICCTPCT in the last 72 hours.  Coagulation profile No results for input(s): INR, PROTIME in the last 168 hours.  No results for input(s): DDIMER in the last 72 hours.  Cardiac Enzymes No results for input(s):  CKMB, TROPONINI, MYOGLOBIN in the last 168 hours.  Invalid input(s): CK ------------------------------------------------------------------------------------------------------------------ No results found for: BNP  Micro Results No results found for this or any previous visit (from the past 240 hour(s)).  Radiology Reports Dg Chest 2 View  Result Date: 05/28/2016 CLINICAL DATA:  Fall and confusion. Daughter says patient has been falling and has been confused over the last couple days, Pt. Denies any chest pain or SOB. EXAM: CHEST  2 VIEW COMPARISON:  06/22/2014;  06/15/2014 FINDINGS: Grossly unchanged cardiac silhouette and mediastinal contours. Interval removal of left subclavian vein approach port a catheter. Improved inspiratory effort. A cardiac pad overlies the right lung apex. No focal airspace opacities. No pleural effusion or pneumothorax. No evidence of edema. No acute osseus abnormalities. Surgical clips overlie the left breast. Post right-sided mastectomy. No acute osseus abnormalities. IMPRESSION: No acute cardiopulmonary disease. Electronically Signed   By: Sandi Mariscal M.D.   On: 05/28/2016 20:44   Ct Head Wo Contrast  Result Date: 05/28/2016 CLINICAL DATA:  Status post multiple recent falls. Confusion, acute onset. Initial encounter. EXAM: CT HEAD WITHOUT CONTRAST TECHNIQUE: Contiguous axial images were obtained from the base of the skull through the vertex without intravenous contrast. COMPARISON:  None. FINDINGS: Brain: No evidence of acute infarction, hemorrhage, hydrocephalus, extra-axial collection or mass lesion/mass effect. Prominence of the ventricles and sulci reflects mild cortical volume loss. Cerebellar atrophy is noted. Scattered periventricular and subcortical white matter change likely reflects small vessel ischemic microangiopathy. The brainstem and fourth ventricle are within normal limits. The basal ganglia are unremarkable in appearance. The cerebral hemispheres demonstrate grossly normal gray-white differentiation. No mass effect or midline shift is seen. Vascular: No hyperdense vessel or unexpected calcification. Skull: There is no evidence of fracture; visualized osseous structures are unremarkable in appearance. Sinuses/Orbits: The orbits are within normal limits. The paranasal sinuses and mastoid air cells are well-aerated. Other: No significant soft tissue abnormalities are seen. IMPRESSION: 1. No evidence of traumatic intracranial injury or fracture. 2. Mild cortical volume loss and scattered small vessel ischemic microangiopathy.  Electronically Signed   By: Garald Balding M.D.   On: 05/28/2016 22:00    Time Spent in minutes  30   Augustino Savastano K M.D on 05/29/2016 at 10:29 AM  Between 7am to 7pm - Pager - [phone] ( page via [facility], text pages only, please mention full 10 digit call back number).  After 7pm go to [email or web address] - password [facility]  [facility] -  Office  [phone]

## 2016-05-29 NOTE — ED Notes (Signed)
Report given to RN on 5W. 

## 2016-05-30 LAB — URINE CULTURE

## 2016-05-30 LAB — TSH: TSH: 5.304 u[IU]/mL — AB (ref 0.350–4.500)

## 2016-05-30 MED ORDER — POTASSIUM CHLORIDE CRYS ER 20 MEQ PO TBCR
40.0000 meq | EXTENDED_RELEASE_TABLET | Freq: Once | ORAL | Status: AC
Start: 2016-05-30 — End: 2016-05-30
  Administered 2016-05-30: 40 meq via ORAL
  Filled 2016-05-30: qty 2

## 2016-05-30 NOTE — Progress Notes (Signed)
NP returned page. No order at this time. If patient sustains a HR in the 140-150 long enough to get EKG, then we will do an EKG. At this time patient is comfortable with first degree HB in the 80s Will continue to monitor.

## 2016-05-30 NOTE — Progress Notes (Signed)
PROGRESS NOTE                                                                                                                                                                                                             Patient Demographics:    Laura Li, is a 76 y.o. female, DOB - 1940-09-22, GBT:[id number]  Admit date - 05/28/2016   Admitting Physician Lily Kocher, MD  Outpatient Primary MD for the patient is [name], MD  LOS - 1  Chief Complaint  Patient presents with  . Fall  . Altered Mental Status       Brief Narrative  Laura Li is a 76 y.o. woman with a history of HTN, HLD, breast cancer, and prior UTI who is accompanied by her daughter in the ED for evaluation of new onset confusion several hours prior to presentation, her workup showed that she had toxic encephalopathy coming from UTI.   Subjective:    Laura Li today has, No headache, No chest pain, No abdominal pain - No Nausea, No new weakness tingling or numbness, No Cough - SOB.     Assessment  & Plan :      1.Sepsis, toxic encephalopathy both arising from UTI. Patient has been placed on IV Rocephin, cultures so far are negative, hydrated with IV fluids, mentation has improved, head CT unremarkable. Increase activity, PT eval, made it via placement, discussed with daughter.  2. Dyslipidemia. On statin continue.  3. GERD. On PPI.  4. Hypothyroidism. Continue home dose Synthroid. check TSH.  5. History of breast cancer and DVT. Follow with Dr. Jana Hakim post [place], continue Jennye Moccasin.  6. ? Paroxysmal A. fib versus sinus tachycardia on telemetry. Check echo, TSH, cardiology input. Repeat telemetry. Home dose beta blocker to be continued for now. Already on xaralto for DVT.    Diet : Diet Heart Room service appropriate? Yes; Fluid consistency: Thin    Family Communication  :  daughter  Code Status :  Full  Disposition Plan  :   TBD  Consults  :  Cardiology  Procedures  :    CT head. Nonacute  Echocardiogram  DVT Prophylaxis  : Xaralto  Lab Results  Component Value Date   PLT 154 05/29/2016    Inpatient Medications  Scheduled Meds: . atorvastatin  10 mg Oral q1800  .  cefTRIAXone (ROCEPHIN)  IV  1 g Intravenous Q24H  . levothyroxine  75 mcg Oral QAC breakfast  . nadolol  10 mg Oral Daily  . pantoprazole  40 mg Oral Daily  . rivaroxaban  20 mg Oral Q supper  . sodium chloride flush  3 mL Intravenous Q12H  . venlafaxine XR  150 mg Oral Q breakfast   Continuous Infusions:  PRN Meds:.acetaminophen, [DISCONTINUED] ondansetron **OR** ondansetron (ZOFRAN) IV  Antibiotics  :    Anti-infectives    Start     Dose/Rate Route Frequency Ordered Stop   05/29/16 2200  cefTRIAXone (ROCEPHIN) 1 g in dextrose 5 % 50 mL IVPB     1 g 100 mL/hr over 30 Minutes Intravenous Every 24 hours 05/29/16 0650     05/28/16 2315  cefTRIAXone (ROCEPHIN) 1 g in dextrose 5 % 50 mL IVPB     1 g 100 mL/hr over 30 Minutes Intravenous  Once 05/28/16 2308 05/29/16 0054         Objective:   Vitals:   05/29/16 0610 05/29/16 1444 05/29/16 2118 05/30/16 0356  BP: (!) 89/57 (!) 114/46 (!) 124/52 (!) 116/50  Pulse: 81 90 73 81  Resp: 20 16 18 18   Temp: 99.1 F (37.3 C) 99.1 F (37.3 C) 98.4 F (36.9 C) 99 F (37.2 C)  TempSrc: Oral Oral  Oral  SpO2: 96% 97% 96% 97%  Weight:      Height:        Wt Readings from Last 3 Encounters:  05/29/16 65.2 kg (143 lb 12.8 oz)  05/23/16 67.1 kg (148 lb)  04/05/16 66.4 kg (146 lb 6.4 oz)     Intake/Output Summary (Last 24 hours) at 05/30/16 1023 Last data filed at 05/30/16 1014  Gross per 24 hour  Intake            707.5 ml  Output              700 ml  Net              7.5 ml     Physical Exam  Awake Alert, Oriented X 3, No new F.N deficits, Normal affect .AT,PERRAL Supple Neck,No JVD, No cervical lymphadenopathy appriciated.  Symmetrical Chest wall movement, Good  air movement bilaterally, CTAB RRR,No Gallops,Rubs or new Murmurs, No Parasternal Heave +ve B.Sounds, Abd Soft, No tenderness, No organomegaly appriciated, No rebound - guarding or rigidity. No Cyanosis, Clubbing or edema, No new Rash or bruise       Data Review:    CBC  Recent Labs Lab 05/28/16 2207 05/29/16 0941  WBC 10.9* 7.0  HGB 12.0 10.7*  HCT 36.3 32.1*  PLT 151 154  MCV 91.9 92.5  MCH 30.4 30.8  MCHC 33.1 33.3  RDW 13.0 13.2  LYMPHSABS 0.3*  --   MONOABS 0.4  --   EOSABS 0.0  --   BASOSABS 0.0  --     Chemistries   Recent Labs Lab 05/28/16 2207 05/29/16 0941  NA 134* 135  K 4.2 3.4*  CL 97* 100*  CO2 25 22  GLUCOSE 123* 148*  BUN 20 15  CREATININE 1.04* 1.06*  CALCIUM 9.6 8.9  AST 38  --   ALT 19  --   ALKPHOS 49  --   BILITOT 1.1  --    ------------------------------------------------------------------------------------------------------------------ No results for input(s): CHOL, HDL, LDLCALC, TRIG, CHOLHDL, LDLDIRECT in the last 72 hours.  No results found for: HGBA1C ------------------------------------------------------------------------------------------------------------------ No results for input(s): TSH,  T4TOTAL, T3FREE, THYROIDAB in the last 72 hours.  Invalid input(s): FREET3 ------------------------------------------------------------------------------------------------------------------ No results for input(s): VITAMINB12, FOLATE, FERRITIN, TIBC, IRON, RETICCTPCT in the last 72 hours.  Coagulation profile No results for input(s): INR, PROTIME in the last 168 hours.  No results for input(s): DDIMER in the last 72 hours.  Cardiac Enzymes No results for input(s): CKMB, TROPONINI, MYOGLOBIN in the last 168 hours.  Invalid input(s): CK ------------------------------------------------------------------------------------------------------------------ No results found for: BNP  Micro Results Recent Results (from the past 240 hour(s))   Culture, blood (routine x 2)     Status: None (Preliminary result)   Collection Time: 05/29/16 12:10 AM  Result Value Ref Range Status   Specimen Description BLOOD LEFT HAND  Final   Special Requests IN PEDIATRIC BOTTLE 1CC  Final   Culture NO GROWTH 1 DAY  Final   Report Status PENDING  Incomplete  Culture, blood (routine x 2)     Status: None (Preliminary result)   Collection Time: 05/29/16 12:22 AM  Result Value Ref Range Status   Specimen Description BLOOD LEFT ANTECUBITAL  Final   Special Requests IN PEDIATRIC BOTTLE 4CC  Final   Culture NO GROWTH 1 DAY  Final   Report Status PENDING  Incomplete  Culture, Urine     Status: Abnormal   Collection Time: 05/29/16  4:19 AM  Result Value Ref Range Status   Specimen Description URINE, RANDOM  Final   Special Requests NONE  Final   Culture <10,000 COLONIES/mL INSIGNIFICANT GROWTH (A)  Final   Report Status 05/30/2016 FINAL  Final    Radiology Reports Dg Chest 2 View  Result Date: 05/28/2016 CLINICAL DATA:  Fall and confusion. Daughter says patient has been falling and has been confused over the last couple days, Pt. Denies any chest pain or SOB. EXAM: CHEST  2 VIEW COMPARISON:  06/22/2014; 06/15/2014 FINDINGS: Grossly unchanged cardiac silhouette and mediastinal contours. Interval removal of left subclavian vein approach port a catheter. Improved inspiratory effort. A cardiac pad overlies the right lung apex. No focal airspace opacities. No pleural effusion or pneumothorax. No evidence of edema. No acute osseus abnormalities. Surgical clips overlie the left breast. Post right-sided mastectomy. No acute osseus abnormalities. IMPRESSION: No acute cardiopulmonary disease. Electronically Signed   By: Sandi Mariscal M.D.   On: 05/28/2016 20:44   Ct Head Wo Contrast  Result Date: 05/28/2016 CLINICAL DATA:  Status post multiple recent falls. Confusion, acute onset. Initial encounter. EXAM: CT HEAD WITHOUT CONTRAST TECHNIQUE: Contiguous axial  images were obtained from the base of the skull through the vertex without intravenous contrast. COMPARISON:  None. FINDINGS: Brain: No evidence of acute infarction, hemorrhage, hydrocephalus, extra-axial collection or mass lesion/mass effect. Prominence of the ventricles and sulci reflects mild cortical volume loss. Cerebellar atrophy is noted. Scattered periventricular and subcortical white matter change likely reflects small vessel ischemic microangiopathy. The brainstem and fourth ventricle are within normal limits. The basal ganglia are unremarkable in appearance. The cerebral hemispheres demonstrate grossly normal gray-white differentiation. No mass effect or midline shift is seen. Vascular: No hyperdense vessel or unexpected calcification. Skull: There is no evidence of fracture; visualized osseous structures are unremarkable in appearance. Sinuses/Orbits: The orbits are within normal limits. The paranasal sinuses and mastoid air cells are well-aerated. Other: No significant soft tissue abnormalities are seen. IMPRESSION: 1. No evidence of traumatic intracranial injury or fracture. 2. Mild cortical volume loss and scattered small vessel ischemic microangiopathy. Electronically Signed   By: Garald Balding M.D.   On:  05/28/2016 22:00    Time Spent in minutes  30   Tayvia Faughnan K M.D on 05/30/2016 at 10:23 AM  Between 7am to 7pm - Pager - [phone] ( page via [facility], text pages only, please mention full 10 digit call back number).  After 7pm go to [email or web address] - password [facility]  [facility] -  Office  [phone]

## 2016-05-30 NOTE — Evaluation (Signed)
Physical Therapy Evaluation Patient Details Name: LAURANNE BEYERSDORF MRN: [id number] DOB: 1940-04-07 Today's Date: 05/30/2016   History of Present Illness  Pt is a 76 y.o. female admitted s/p a fall with dx of toxic encephalopathy from a UTI. PMH is significant for HTN, CKD, falls, breast cancer, and DVT.   Clinical Impression  Pt was able to independently ambulate 200 feet and ascend/descend 10 stairs with modified independence. Pt was educated on how to modify ascending/descending stairs with using 2 hands on one rail to increase safety. Reports hx of vertigo/ balance impairments and underwent previous therapy. Currently involved in fitness activities in local gym with trainer assistance. Pt does not need further acute PT services. Recommend d/c home with no follow up services required.     Follow Up Recommendations No PT follow up    Equipment Recommendations  None recommended by PT    Recommendations for Other Services       Precautions / Restrictions Precautions Precautions: Fall Restrictions Weight Bearing Restrictions: No      Mobility  Bed Mobility Overal bed mobility: Independent                Transfers Overall transfer level: Independent Equipment used: None                Ambulation/Gait Ambulation/Gait assistance: Independent Ambulation Distance (Feet): 200 Feet Assistive device: None Gait Pattern/deviations: WFL(Within Functional Limits)        Stairs Stairs: Yes Stairs assistance: Modified independent (Device/Increase time) Stair Management: One rail Right Number of Stairs: 10 General stair comments: Pt was able to ambulate up and down stairs with supervision for safety, educated the patient on how to navigate stairs with two hands on one rail to increase stability and safety.   Wheelchair Mobility    Modified Rankin (Stroke Patients Only)       Balance Overall balance assessment: Needs assistance;History of Falls Sitting-balance  support: No upper extremity supported;Feet supported Sitting balance-Leahy Scale: Normal       Standing balance-Leahy Scale: Good Standing balance comment: Pt states that she feels she looses balance towards the R.          [place]berg - Eyes Opened: 16 [place] - Eyes Closed: 30 (increased sway in all directions )   High Level Balance Comments: Pt unable to maintain tandem stance without assistance. Rhomberg eyes closed: pt displayed increase sway in all direction.              Pertinent Vitals/Pain Pain Assessment: No/denies pain    Home Living Family/patient expects to be discharged to:: Private residence Living Arrangements: Alone Available Help at Discharge: Family (daughter) Type of Home: House Home Access: Stairs to enter Entrance Stairs-Rails: Right;Left;Can reach both Technical brewer of Steps: 4 Home Layout: Two level Home Equipment: None      Prior Function Level of Independence: Independent               Hand Dominance   Dominant Hand: Right    Extremity/Trunk Assessment   Upper Extremity Assessment Upper Extremity Assessment: Defer to OT evaluation    Lower Extremity Assessment Lower Extremity Assessment: RLE deficits/detail;LLE deficits/detail RLE Deficits / Details: Hip flexion: 4/5, knee extension 4/5, Knee flexion: 4/5, DF: 5/5  LLE Deficits / Details: Hip flexion: 4/5, knee extension 4/5, Knee flexion: 4/5, DF: 5/5     Cervical / Trunk Assessment Cervical / Trunk Assessment: Normal  Communication   Communication: No difficulties  Cognition Arousal/Alertness: Awake/alert Behavior During Therapy: [facility]  for tasks assessed/performed Overall Cognitive Status: Within Functional Limits for tasks assessed                 General Comments: Had some difficulty with word finding but states that is getting better since treatment of infection.     General Comments      Exercises     Assessment/Plan    PT Assessment Patent  does not need any further PT services  PT Problem List         PT Treatment Interventions      PT Goals (Current goals can be found in the Care Plan section)  Acute Rehab PT Goals Patient Stated Goal: To return home and to independence PT Goal Formulation: With patient Time For Goal Achievement: 06/06/16 Potential to Achieve Goals: Good    Frequency     Barriers to discharge        Co-evaluation               End of Session Equipment Utilized During Treatment: Gait belt Activity Tolerance: Patient tolerated treatment well Patient left: in chair;with call bell/phone within reach   PT Visit Diagnosis: Other abnormalities of gait and mobility (R26.89)         Time: 1425-1450 PT Time Calculation (min) (ACUTE ONLY): 25 min   Charges:   PT Evaluation $PT Eval Low Complexity: 1 Procedure PT Treatments $Gait Training: 8-22 mins   PT G CodesGlee Arvin, SPT 05/30/2016

## 2016-05-30 NOTE — Progress Notes (Signed)
Patient's HR has been intermittenly running into the 140 /150s. Most recent info from central tele was the patient was reading AFib. Checked on patient. HR back to WNL and in a Sinus Rhythm. Vital Signs taken. Will page NP on call    :1P50 Li, Laura: Patient has been having episodes with a HR in the 140-150 and tele said it showed afif.VS now 116/50 HR 81. EKG? Asymptomatic.   Awaiting call back.

## 2016-05-30 NOTE — Progress Notes (Signed)
Patient ambulating during this shift as scheduled,  in the hallway independent with supervision. No complaints of pain or other distress. Will continue to monitor.

## 2016-05-31 ENCOUNTER — Other Ambulatory Visit (HOSPITAL_COMMUNITY): Payer: Medicare Other

## 2016-05-31 LAB — BASIC METABOLIC PANEL
ANION GAP: 11 (ref 5–15)
BUN: 9 mg/dL (ref 6–20)
CALCIUM: 9.7 mg/dL (ref 8.9–10.3)
CO2: 24 mmol/L (ref 22–32)
Chloride: 104 mmol/L (ref 101–111)
Creatinine, Ser: 0.89 mg/dL (ref 0.44–1.00)
GFR calc Af Amer: >60 mL/min (ref >60)
GLUCOSE: 103 mg/dL — AB (ref 65–99)
Potassium: 4.3 mmol/L (ref 3.5–5.1)
Sodium: 139 mmol/L (ref 135–145)

## 2016-05-31 MED ORDER — CEFPODOXIME PROXETIL 200 MG PO TABS: 200.0000 mg | ORAL_TABLET | Freq: Two times a day (BID) | ORAL | 0 refills | Status: DC

## 2016-05-31 NOTE — Consult Note (Signed)
            [facility] Primary Care Navigator  4/[birth year]  BILL MCVEY [birth year] [id number]   Wentto see patient at the bedside to identify possible discharge needs but staffreports that she was alreadydischarged.  Patient was discharged home today. Notification done to primary care provider's office (Sarah)regarding patient's discharge and need for post hospital follow-up and transition of care.  Made aware to refer patient to [facility] care management if deemed appropriate for services.  For additional questions please contact:  Edwena Felty A. Farryn Linares, BSN, RN-BC [facility] PRIMARY CARE Navigator Cell: [phone]

## 2016-05-31 NOTE — Discharge Summary (Signed)
Laura Li KVQ:[id number] DOB: [birth year] DOA: 05/28/2016  PCP: [name], MD  Admit date: 05/28/2016  Discharge date: 05/31/2016  Admitted From: Home  Disposition:  Home   Recommendations for Outpatient Follow-up:   Follow up with PCP in 1-2 weeks  PCP Please obtain BMP/CBC, 2 view CXR in 1week,  (see Discharge instructions)   PCP Please follow up on the following pending results:   outpatient echocardiogram   Home Health: PT, Rn   Equipment/Devices: None  Consultations: None Discharge Condition: Stable   CODE STATUS: Full   Diet Recommendation:  Heart Healthy    Chief Complaint  Patient presents with  . Fall  . Altered Mental Status     Brief history of present illness from the day of admission and additional interim summary    Laura Li a 76 y.o.woman with a history of HTN, HLD, breast cancer, and prior UTI who is accompanied by her daughter in the ED for evaluation of new onset confusion several hours prior to presentation, her workup showed that she had toxic encephalopathy coming from UTI.                                                                 Hospital Course    1.Sepsis, toxic encephalopathy both arising from UTI. Was treated with IV Rocephin and responded  Well to it, close to baseline now, cultures so far negative, she was also hydrated with IV fluids. Mental status back to baseline. Head CT was unremarkable. She worked well with PT, ambulating herself in the hallway. Symptom-free now will be discharged home. Request PCP to monitor for mild dementia..  2. Dyslipidemia. On statin continue.  3. GERD. On PPI.  4. Hypothyroidism. Continue home dose Synthroid. TSH borderline high PCP to monitor.  5. History of breast cancer and DVT. Follow with Dr. Jana Hakim post [place],  continue Jennye Moccasin.  6. Paroxysmal A. fib  - 1 episode at night, per history looks like patient has had few episodes at home as well, she is already on beta blocker, blood pressure too low to titrate beta blocker up, she is already on anticoagulation both of which will be continued. Requested her to follow with cardiology in 1-2 weeks for outpatient echocardiogram. From cardiac standpoint she is compensated and symptom free.   Lab Results  Component Value Date   TSH 5.304 (H) 05/30/2016     Discharge diagnosis     Principal Problem:   Generalized weakness Active Problems:   Hypothyroidism   Essential hypertension   Chronic kidney disease, stage III (moderate)   Acute lower UTI   Falls   Acute encephalopathy    Discharge instructions    Discharge Instructions    Diet - low sodium heart healthy    Complete by:  As directed  Discharge instructions    Complete by:  As directed    Follow with Primary MD [name], MD in 7 days   Get CBC, CMP, 2 view Chest X ray checked  by Primary MD or SNF MD in 5-7 days ( we routinely change or add medications that can affect your baseline labs and fluid status, therefore we recommend that you get the mentioned basic workup next visit with your PCP, your PCP may decide not to get them or add new tests based on their clinical decision)  Activity: As tolerated with Full fall precautions use walker/cane & assistance as needed  Disposition Home    Diet:  Heart Healthy    For Heart failure patients - Check your Weight same time everyday, if you gain over 2 pounds, or you develop in leg swelling, experience more shortness of breath or chest pain, call your Primary MD immediately. Follow Cardiac Low Salt Diet and 1.5 lit/day fluid restriction.  On your next visit with your primary care physician please Get Medicines reviewed and adjusted.  Please request your Prim.MD to go over all Hospital Tests and Procedure/Radiological results at the  follow up, please get all Hospital records sent to your Prim MD by signing hospital release before you go home.  If you experience worsening of your admission symptoms, develop shortness of breath, life threatening emergency, suicidal or homicidal thoughts you must seek medical attention immediately by calling 911 or calling your MD immediately  if symptoms less severe.  You Must read complete instructions/literature along with all the possible adverse reactions/side effects for all the Medicines you take and that have been prescribed to you. Take any new Medicines after you have completely understood and accpet all the possible adverse reactions/side effects.   Do not drive, operate heavy machinery, perform activities at heights, swimming or participation in water activities or provide baby sitting services if your were admitted for syncope or siezures until you have seen by Primary MD or a Neurologist and advised to do so again.  Do not drive when taking Pain medications.    Do not take more than prescribed Pain, Sleep and Anxiety Medications  Special Instructions: If you have smoked or chewed Tobacco  in the last 2 yrs please stop smoking, stop any regular Alcohol  and or any Recreational drug use.  Wear Seat belts while driving.   Please note  You were cared for by a hospitalist during your hospital stay. If you have any questions about your discharge medications or the care you received while you were in the hospital after you are discharged, you can call the unit and asked to speak with the hospitalist on call if the hospitalist that took care of you is not available. Once you are discharged, your primary care physician will handle any further medical issues. Please note that NO REFILLS for any discharge medications will be authorized once you are discharged, as it is imperative that you return to your primary care physician (or establish a relationship with a primary care physician if you  do not have one) for your aftercare needs so that they can reassess your need for medications and monitor your lab values.   Increase activity slowly    Complete by:  As directed       Discharge Medications   Allergies as of 05/31/2016      Reactions   Dyazide [hydrochlorothiazide W-triamterene]    Presumption: drug induced vasculitis   Niacin Other (See Comments)  flushing   Penicillins Diarrhea, Nausea And Vomiting      Medication List    STOP taking these medications   nitrofurantoin (macrocrystal-monohydrate) 100 MG capsule Commonly known as:  MACROBID     TAKE these medications   acetaminophen 500 MG tablet Commonly known as:  TYLENOL Take 500 mg by mouth 2 (two) times daily as needed for mild pain. Reported on 07/07/2015   atorvastatin 10 MG tablet Commonly known as:  LIPITOR Take 1 tablet (10 mg total) by mouth daily.   b complex-C-E-zinc tablet Take 1 tablet by mouth daily.   CALTRATE 600 PLUS-VIT D PO Take 1 tablet by mouth 2 (two) times daily.   cefpodoxime 200 MG tablet Commonly known as:  VANTIN Take 1 tablet (200 mg total) by mouth 2 (two) times daily.   ciclopirox 8 % solution Commonly known as:  PENLAC APPLY ON THE AFFECTED NAILS BID UTD   CULTURELLE DIGESTIVE HEALTH PO Take 1 capsule by mouth daily.   esomeprazole 40 MG capsule Commonly known as:  NEXIUM TAKE 1 BY MOUTH DAILY   fexofenadine 180 MG tablet Commonly known as:  ALLEGRA Take 180 mg by mouth daily as needed for allergies.   ipratropium 0.03 % nasal spray Commonly known as:  ATROVENT Place 2 sprays into the nose every 12 (twelve) hours.   levothyroxine 75 MCG tablet Commonly known as:  SYNTHROID, LEVOTHROID Take 1 tablet (75 mcg total) by mouth daily.   nadolol 20 MG tablet Commonly known as:  CORGARD TAKE 1/2 TABLET(10 MG) BY MOUTH DAILY   rivaroxaban 20 MG Tabs tablet Commonly known as:  XARELTO Take 1 tablet (20 mg total) by mouth daily with supper.   STRESS FORMULA  PO Take 1 tablet by mouth every morning.   SYSTANE BALANCE 0.6 % Soln Generic drug:  Propylene Glycol Apply 1-2 drops to eye daily as needed (dry eyes).   SYSTANE OVERNIGHT THERAPY 0.3 % Gel ophthalmic ointment Generic drug:  hypromellose Place 1 application into both eyes at bedtime as needed for dry eyes.   venlafaxine XR 150 MG 24 hr capsule Commonly known as:  EFFEXOR-XR TAKE 1 CAPSULE(150 MG) BY MOUTH DAILY       Follow-up Information    [name], MD. Schedule an appointment as soon as possible for a visit in 1 week(s).   Specialty:  Family Medicine Contact information: [place] [address] [phone]        Candee Furbish, MD. Schedule an appointment as soon as possible for a visit in 1 week(s).   Specialty:  Cardiology Why:  Echo, Afib Contact information: [address] [place] [address] [phone]           Major procedures and Radiology Reports - PLEASE review detailed and final reports thoroughly  -         Dg Chest 2 View  Result Date: 05/28/2016 CLINICAL DATA:  Fall and confusion. Daughter says patient has been falling and has been confused over the last couple days, Pt. Denies any chest pain or SOB. EXAM: CHEST  2 VIEW COMPARISON:  06/22/2014; 06/15/2014 FINDINGS: Grossly unchanged cardiac silhouette and mediastinal contours. Interval removal of left subclavian vein approach port a catheter. Improved inspiratory effort. A cardiac pad overlies the right lung apex. No focal airspace opacities. No pleural effusion or pneumothorax. No evidence of edema. No acute osseus abnormalities. Surgical clips overlie the left breast. Post right-sided mastectomy. No acute osseus abnormalities. IMPRESSION: No acute  cardiopulmonary disease. Electronically Signed   By: Sandi Mariscal M.D.   On: 05/28/2016 20:44   Ct Head Wo Contrast  Result Date: 05/28/2016 CLINICAL DATA:  Status post multiple recent falls. Confusion,  acute onset. Initial encounter. EXAM: CT HEAD WITHOUT CONTRAST TECHNIQUE: Contiguous axial images were obtained from the base of the skull through the vertex without intravenous contrast. COMPARISON:  None. FINDINGS: Brain: No evidence of acute infarction, hemorrhage, hydrocephalus, extra-axial collection or mass lesion/mass effect. Prominence of the ventricles and sulci reflects mild cortical volume loss. Cerebellar atrophy is noted. Scattered periventricular and subcortical white matter change likely reflects small vessel ischemic microangiopathy. The brainstem and fourth ventricle are within normal limits. The basal ganglia are unremarkable in appearance. The cerebral hemispheres demonstrate grossly normal gray-white differentiation. No mass effect or midline shift is seen. Vascular: No hyperdense vessel or unexpected calcification. Skull: There is no evidence of fracture; visualized osseous structures are unremarkable in appearance. Sinuses/Orbits: The orbits are within normal limits. The paranasal sinuses and mastoid air cells are well-aerated. Other: No significant soft tissue abnormalities are seen. IMPRESSION: 1. No evidence of traumatic intracranial injury or fracture. 2. Mild cortical volume loss and scattered small vessel ischemic microangiopathy. Electronically Signed   By: Garald Balding M.D.   On: 05/28/2016 22:00    Micro Results     Recent Results (from the past 240 hour(s))  Culture, blood (routine x 2)     Status: None (Preliminary result)   Collection Time: 05/29/16 12:10 AM  Result Value Ref Range Status   Specimen Description BLOOD LEFT HAND  Final   Special Requests IN PEDIATRIC BOTTLE [place]  Final   Culture NO GROWTH 1 DAY  Final   Report Status PENDING  Incomplete  Culture, blood (routine x 2)     Status: None (Preliminary result)   Collection Time: 05/29/16 12:22 AM  Result Value Ref Range Status   Specimen Description BLOOD LEFT ANTECUBITAL  Final   Special Requests IN  PEDIATRIC BOTTLE 4CC  Final   Culture NO GROWTH 1 DAY  Final   Report Status PENDING  Incomplete  Culture, Urine     Status: Abnormal   Collection Time: 05/29/16  4:19 AM  Result Value Ref Range Status   Specimen Description URINE, RANDOM  Final   Special Requests NONE  Final   Culture <10,000 COLONIES/mL INSIGNIFICANT GROWTH (A)  Final   Report Status 05/30/2016 FINAL  Final    Today   Subjective    Laura Li today has no headache,no chest abdominal pain,no new weakness tingling or numbness, feels much better wants to go home today.     Objective   Blood pressure (!) 114/52, pulse 72, temperature 98.5 F (36.9 C), temperature source Oral, resp. rate 16, height 5\' 6"  (1.676 m), weight 65.2 kg (143 lb 12.8 oz), SpO2 96 %.   Intake/Output Summary (Last 24 hours) at 05/31/16 1144 Last data filed at 05/31/16 0818  Gross per 24 hour  Intake              120 ml  Output             1950 ml  Net            -1830 ml    Exam Awake Alert, Oriented x 3, No new F.N deficits, Normal affect [place].AT,PERRAL Supple Neck,No JVD, No cervical lymphadenopathy appriciated.  Symmetrical Chest wall movement, Good air movement bilaterally, CTAB RRR,No Gallops,Rubs or new Murmurs, No Parasternal Heave +ve  B.Sounds, Abd Soft, Non tender, No organomegaly appriciated, No rebound -guarding or rigidity. No Cyanosis, Clubbing or edema, No new Rash or bruise   Data Review   CBC w Diff: Lab Results  Component Value Date   WBC 7.0 05/29/2016   HGB 10.7 (L) 05/29/2016   HGB 12.9 12/26/2015   HCT 32.1 (L) 05/29/2016   HCT 38.7 12/26/2015   PLT 154 05/29/2016   PLT 216 12/26/2015   LYMPHOPCT 3 05/28/2016   LYMPHOPCT 19.5 12/26/2015   MONOPCT 4 05/28/2016   MONOPCT 7.0 12/26/2015   EOSPCT 0 05/28/2016   EOSPCT 2.6 12/26/2015   BASOPCT 0 05/28/2016   BASOPCT 0.7 12/26/2015    CMP: Lab Results  Component Value Date   NA 139 05/31/2016   NA 142 12/26/2015   K 4.3 05/31/2016   K 4.6  12/26/2015   CL 104 05/31/2016   CO2 24 05/31/2016   CO2 27 12/26/2015   BUN 9 05/31/2016   BUN 18.7 12/26/2015   CREATININE 0.89 05/31/2016   CREATININE 1.1 12/26/2015   PROT 6.9 05/28/2016   PROT 8.0 12/26/2015   ALBUMIN 4.3 05/28/2016   ALBUMIN 4.2 12/26/2015   BILITOT 1.1 05/28/2016   BILITOT 0.47 12/26/2015   ALKPHOS 49 05/28/2016   ALKPHOS 79 12/26/2015   AST 38 05/28/2016   AST 20 12/26/2015   ALT 19 05/28/2016   ALT 15 12/26/2015  .   Total Time in preparing paper work, data evaluation and todays exam - 35 minutes  Thurnell Lose M.D on 05/31/2016 at 11:44 AM  [facility]   Office  [phone]

## 2016-05-31 NOTE — Progress Notes (Signed)
Nsg Discharge Note  Admit Date:  05/28/2016 Discharge date: 0/96/0454   Ricky Ala to be D/C'd Home per MD order.  AVS completed.  Copy for chart, and copy for patient signed, and dated. Patient/caregiver able to verbalize understanding.  Discharge Medication: Allergies as of 05/31/2016      Reactions   Dyazide [hydrochlorothiazide W-triamterene]    Presumption: drug induced vasculitis   Niacin Other (See Comments)   flushing   Penicillins Diarrhea, Nausea And Vomiting      Medication List    STOP taking these medications   nitrofurantoin (macrocrystal-monohydrate) 100 MG capsule Commonly known as:  MACROBID     TAKE these medications   acetaminophen 500 MG tablet Commonly known as:  TYLENOL Take 500 mg by mouth 2 (two) times daily as needed for mild pain. Reported on 07/07/2015   atorvastatin 10 MG tablet Commonly known as:  LIPITOR Take 1 tablet (10 mg total) by mouth daily.   b complex-C-E-zinc tablet Take 1 tablet by mouth daily.   CALTRATE 600 PLUS-VIT D PO Take 1 tablet by mouth 2 (two) times daily.   cefpodoxime 200 MG tablet Commonly known as:  VANTIN Take 1 tablet (200 mg total) by mouth 2 (two) times daily.   ciclopirox 8 % solution Commonly known as:  PENLAC APPLY ON THE AFFECTED NAILS BID UTD   CULTURELLE DIGESTIVE HEALTH PO Take 1 capsule by mouth daily.   esomeprazole 40 MG capsule Commonly known as:  NEXIUM TAKE 1 BY MOUTH DAILY   fexofenadine 180 MG tablet Commonly known as:  ALLEGRA Take 180 mg by mouth daily as needed for allergies.   ipratropium 0.03 % nasal spray Commonly known as:  ATROVENT Place 2 sprays into the nose every 12 (twelve) hours.   levothyroxine 75 MCG tablet Commonly known as:  SYNTHROID, LEVOTHROID Take 1 tablet (75 mcg total) by mouth daily.   nadolol 20 MG tablet Commonly known as:  CORGARD TAKE 1/2 TABLET(10 MG) BY MOUTH DAILY   rivaroxaban 20 MG Tabs tablet Commonly known as:  XARELTO Take 1 tablet (20  mg total) by mouth daily with supper.   STRESS FORMULA PO Take 1 tablet by mouth every morning.   SYSTANE BALANCE 0.6 % Soln Generic drug:  Propylene Glycol Apply 1-2 drops to eye daily as needed (dry eyes).   SYSTANE OVERNIGHT THERAPY 0.3 % Gel ophthalmic ointment Generic drug:  hypromellose Place 1 application into both eyes at bedtime as needed for dry eyes.   venlafaxine XR 150 MG 24 hr capsule Commonly known as:  EFFEXOR-XR TAKE 1 CAPSULE(150 MG) BY MOUTH DAILY       Discharge Assessment: Vitals:   05/31/16 0520 05/31/16 0630  BP: (!) 114/52 (!) 114/52  Pulse: 72 72  Resp: 16 16  Temp:  98.5 F (36.9 C)   Skin clean, dry and intact without evidence of skin break down, no evidence of skin tears noted. IV catheter discontinued intact. Site without signs and symptoms of complications - no redness or edema noted at insertion site, patient denies c/o pain - only slight tenderness at site.  Dressing with slight pressure applied.  D/c Instructions-Education: Discharge instructions given to patient/family with verbalized understanding. D/c education completed with patient/family including follow up instructions, medication list, d/c activities limitations if indicated, with other d/c instructions as indicated by MD - patient able to verbalize understanding, all questions fully answered. Patient instructed to return to ED, call 911, or call MD for any changes in condition.  Patient escorted via [place], and D/C home via private auto.  Salley Slaughter, RN 05/31/2016 12:33 PM

## 2016-05-31 NOTE — Discharge Instructions (Signed)
Follow with Primary MD [name], MD in 7 days   Get CBC, CMP, 2 view Chest X ray checked  by Primary MD or SNF MD in 5-7 days ( we routinely change or add medications that can affect your baseline labs and fluid status, therefore we recommend that you get the mentioned basic workup next visit with your PCP, your PCP may decide not to get them or add new tests based on their clinical decision)  Activity: As tolerated with Full fall precautions use walker/cane & assistance as needed  Disposition Home    Diet:  Heart Healthy    For Heart failure patients - Check your Weight same time everyday, if you gain over 2 pounds, or you develop in leg swelling, experience more shortness of breath or chest pain, call your Primary MD immediately. Follow Cardiac Low Salt Diet and 1.5 lit/day fluid restriction.  On your next visit with your primary care physician please Get Medicines reviewed and adjusted.  Please request your Prim.MD to go over all Hospital Tests and Procedure/Radiological results at the follow up, please get all Hospital records sent to your Prim MD by signing hospital release before you go home.  If you experience worsening of your admission symptoms, develop shortness of breath, life threatening emergency, suicidal or homicidal thoughts you must seek medical attention immediately by calling 911 or calling your MD immediately  if symptoms less severe.  You Must read complete instructions/literature along with all the possible adverse reactions/side effects for all the Medicines you take and that have been prescribed to you. Take any new Medicines after you have completely understood and accpet all the possible adverse reactions/side effects.   Do not drive, operate heavy machinery, perform activities at heights, swimming or participation in water activities or provide baby sitting services if your were admitted for syncope or siezures until you have seen by Primary MD or a Neurologist  and advised to do so again.  Do not drive when taking Pain medications.    Do not take more than prescribed Pain, Sleep and Anxiety Medications  Special Instructions: If you have smoked or chewed Tobacco  in the last 2 yrs please stop smoking, stop any regular Alcohol  and or any Recreational drug use.  Wear Seat belts while driving.   Please note  You were cared for by a hospitalist during your hospital stay. If you have any questions about your discharge medications or the care you received while you were in the hospital after you are discharged, you can call the unit and asked to speak with the hospitalist on call if the hospitalist that took care of you is not available. Once you are discharged, your primary care physician will handle any further medical issues. Please note that NO REFILLS for any discharge medications will be authorized once you are discharged, as it is imperative that you return to your primary care physician (or establish a relationship with a primary care physician if you do not have one) for your aftercare needs so that they can reassess your need for medications and monitor your lab values.

## 2016-06-03 LAB — CULTURE, BLOOD (ROUTINE X 2)
CULTURE: NO GROWTH
Culture: NO GROWTH

## 2016-06-04 ENCOUNTER — Ambulatory Visit (INDEPENDENT_AMBULATORY_CARE_PROVIDER_SITE_OTHER): Payer: Medicare Other | Admitting: Family Medicine

## 2016-06-04 ENCOUNTER — Encounter: Payer: Self-pay | Admitting: Family Medicine

## 2016-06-04 DIAGNOSIS — N390 Urinary tract infection, site not specified: Secondary | ICD-10-CM | POA: Diagnosis not present

## 2016-06-04 DIAGNOSIS — I480 Paroxysmal atrial fibrillation: Secondary | ICD-10-CM | POA: Diagnosis not present

## 2016-06-04 DIAGNOSIS — N183 Chronic kidney disease, stage 3 unspecified: Secondary | ICD-10-CM

## 2016-06-04 DIAGNOSIS — G9340 Encephalopathy, unspecified: Secondary | ICD-10-CM | POA: Diagnosis not present

## 2016-06-04 LAB — POC URINALSYSI DIPSTICK (AUTOMATED)
Bilirubin, UA: NEGATIVE
GLUCOSE UA: NEGATIVE
Ketones, UA: NEGATIVE
Leukocytes, UA: NEGATIVE
NITRITE UA: NEGATIVE
PROTEIN UA: NEGATIVE
RBC UA: NEGATIVE
Spec Grav, UA: 1.025 (ref 1.030–1.035)
UROBILINOGEN UA: 0.2 (ref ?–2.0)
pH, UA: 6 (ref 5.0–8.0)

## 2016-06-04 NOTE — Patient Instructions (Signed)
Please stop by lab before you go  Please see [place] back if you were to have recurrent or worsening symptoms.   Glad you have the cardiology follow up

## 2016-06-04 NOTE — Assessment & Plan Note (Signed)
Now resolved with UTI treatment

## 2016-06-04 NOTE — Progress Notes (Signed)
Pre visit review using our clinic review tool, if applicable. No additional management support is needed unless otherwise documented below in the visit note. 

## 2016-06-04 NOTE — Assessment & Plan Note (Signed)
Finished antibiotics. Some lingering dysuria. Incontinence has resolved. Get UA and culture given lingering symptoms.

## 2016-06-04 NOTE — Progress Notes (Signed)
Subjective:  Laura Li is a 76 y.o. year old very pleasant female patient who presents for/with See problem oriented charting ROS- no fever or chills, perhaps very mild dysuria. Very mild cough  Patient was hospitalized due to encephalopathy due to UTI. Before hospitalization, Had called in on Monday before hospitalization and been on antibiotics 2 days for UTI through GYN- bactrim. Was not seen at that time. Had been sick over the weekend prior.   She presented with confusion on 05/28/16. Notes metntion sepsis and toxic encephalopathy due to UTI. Treated with IV rocephin with good response. Also was dehydrated and treated with IVF. Headt CT without acute changes. Some cortical volume loss.  Mental status returned to baseline with treatment. Some question about potential underlying dementia- MMSE 10/06/15 was 28/30. She was discharged on cefpodoxime 200mg  BID. Blood cultures did not grow bacteria. Urine culture 05/29/16 which is day after admission did not show any significant growth  Today, states she has the mildest of pain with urination. Had some incontinence that has resolved  For chornic conditions she was continued on her statin. For GERD continued on PPI. For thyroid was continued on synthroid- TSH was broderline high but patient was acutely ill. For history of DVT and breast cancer- was continued on xarelto and will have follow up with Dr. Jana Hakim outpatient.   atrial fibrillation was discovered in hospital one evening when patient had palpitations and picked up on rhythm strip but BP too low to go up on beta blocker. On anticoagulation. Plan was for cardiology forllow up in 1-2 weeks for echocardiogram but she was well compensated. In retrospect patient states she has had palpitations that she has not admitted to prior for some time. Possible was having a fib at other times.   Past Medical History-  Patient Active Problem List   Diagnosis Date Noted  . Paroxysmal atrial fibrillation  ([place]) 06/04/2016    Priority: High  . Chronic deep vein thrombosis (DVT) ([facility]) 09/01/2015    Priority: High  . Localized swelling, mass, or lump of lower extremity 11/05/2014    Priority: High  . Pneumothorax 06/15/2014    Priority: High  . Breast cancer of lower-outer quadrant of left female breast ([place]) 04/24/2014    Priority: High  . Vasculitis ([facility]) 11/09/2012    Priority: High  . Acute lower UTI 05/29/2016    Priority: Medium  . Melanoma of skin ([place]) 03/31/2014    Priority: Medium  . Chronic kidney disease, stage III (moderate) 12/09/2012    Priority: Medium  . 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    Priority: Medium  . DVT, HX OF 12/30/2006    Priority: Medium  . Hypothyroidism 02/06/2006    Priority: Medium  . Hyperlipidemia 02/06/2006    Priority: Medium  . Essential hypertension 02/06/2006    Priority: Medium  . Diverticulitis 08/09/2014    Priority: Low  . GERD (gastroesophageal reflux disease) 03/31/2014    Priority: Low  . Hot flashes 03/31/2014    Priority: Low  . Osteoarthritis 03/31/2014    Priority: Low  . Allergic rhinitis 02/06/2006    Priority: Low  . Osteopenia 05/23/2016  . Family history of breast cancer 04/29/2014  . Breast cancer, right breast ([place]) 04/24/2014    Medications- reviewed and updated Current Outpatient Prescriptions  Medication Sig Dispense Refill  . acetaminophen (TYLENOL) 500 MG tablet Take 500 mg by mouth 2 (two) times daily as needed for mild pain. Reported on 07/07/2015    . atorvastatin (  LIPITOR) 10 MG tablet Take 1 tablet (10 mg total) by mouth daily. 90 tablet 3  . B Complex-C-E-Zn (B COMPLEX-C-E-ZINC) tablet Take 1 tablet by mouth daily.    . B Complex-C-Folic Acid (STRESS FORMULA PO) Take 1 tablet by mouth every morning.     . Calcium-Vitamin D (CALTRATE 600 PLUS-VIT D PO) Take 1 tablet by mouth 2 (two) times daily.     . cefpodoxime (VANTIN) 200 MG tablet Take 1 tablet (200 mg total) by mouth 2 (two) times daily. 8  tablet 0  . ciclopirox (PENLAC) 8 % solution APPLY ON THE AFFECTED NAILS BID UTD  5  . esomeprazole (NEXIUM) 40 MG capsule TAKE 1 BY MOUTH DAILY 90 capsule 2  . fexofenadine (ALLEGRA) 180 MG tablet Take 180 mg by mouth daily as needed for allergies.     . hypromellose (SYSTANE OVERNIGHT THERAPY) 0.3 % GEL ophthalmic ointment Place 1 application into both eyes at bedtime as needed for dry eyes.    [name] ipratropium (ATROVENT) 0.03 % nasal spray Place 2 sprays into the nose every 12 (twelve) hours. 180 mL 3  . Lactobacillus-Inulin (CULTURELLE DIGESTIVE HEALTH PO) Take 1 capsule by mouth daily.    [name] levothyroxine (SYNTHROID, LEVOTHROID) 75 MCG tablet Take 1 tablet (75 mcg total) by mouth daily. 30 tablet 0  . nadolol (CORGARD) 20 MG tablet TAKE 1/2 TABLET(10 MG) BY MOUTH DAILY 45 tablet 2  . Propylene Glycol (SYSTANE BALANCE) 0.6 % SOLN Apply 1-2 drops to eye daily as needed (dry eyes).     . rivaroxaban (XARELTO) 20 MG TABS tablet Take 1 tablet (20 mg total) by mouth daily with supper. 90 tablet 3  . venlafaxine XR (EFFEXOR-XR) 150 MG 24 hr capsule TAKE 1 CAPSULE(150 MG) BY MOUTH DAILY 90 capsule 2   No current facility-administered medications for this visit.     Objective: BP 136/74 (BP Location: Left Arm, Patient Position: Sitting, Cuff Size: Normal)   Pulse 63   Temp 97.4 F (36.3 C) (Oral)   Ht 5\' 6"  (1.676 m)   Wt 149 lb 3.2 oz (67.7 kg)   SpO2 92%   BMI 24.08 kg/m  Gen: NAD, resting comfortably CV: RRR systolic murmur noted Lungs: CTAB no crackles, wheeze, rhonchi Abdomen: soft/nontender including no suprapubic tenderness/nondistended/normal bowel sounds. No rebound or guarding.  Ext: no edema Skin: warm, dry  Assessment/Plan:  Acute lower UTI Finished antibiotics. Some lingering dysuria. Incontinence has resolved. Get UA and culture given lingering symptoms.   Acute encephalopathy Now resolved with UTI treatment  Paroxysmal atrial fibrillation [facility]) Atrial fibrillation  discovered during hospitalization. Today admits has had some palpitations and lightheaded feelings in past- possible but unclear if she was going into atrial fibrillation at those times Patient was already on xarelto due to DVT history thankfully She was already on nadolol and remained rate controlled apparently even when in a fib.  Sees cardiology on 06/11/16 with ? Of outpatient echocardiogram per hospitalist notes No changes today  Chronic kidney disease, stage III (moderate) GFR remains stable in 45-55 range on labs at time of visit  Orders Placed This Encounter  Procedures  . Urine culture    [facility]  . CBC    [facility]  . Basic metabolic panel    [facility]  . POCT Urinalysis Dipstick (Automated)   The duration of face-to-face time during this visit was greater than 30 minutes. Greater than 50% of this time was spent in counseling, explanation of diagnosis, planning of further management, and/or  coordination of care including discussion of getting urine culture before treatment in future and reason for that, concerns about atrial fibrillation but discusison already on appropriate therapy, discussion on if patient needs to continue to see GYN- I asked patient to depend on her doctor/patient relationship with her GYN.    Return precautions advised.  [name], MD

## 2016-06-05 LAB — CBC
HEMATOCRIT: 34.6 % — AB (ref 36.0–46.0)
Hemoglobin: 12.2 g/dL (ref 12.0–15.0)
MCHC: 35.3 g/dL (ref 30.0–36.0)
MCV: 93.1 fl (ref 78.0–100.0)
Platelets: 226 10*3/uL (ref 150.0–400.0)
RBC: 3.72 Mil/uL — ABNORMAL LOW (ref 3.87–5.11)
RDW: 13.5 % (ref 11.5–15.5)
WBC: 4.6 10*3/uL (ref 4.0–10.5)

## 2016-06-05 LAB — BASIC METABOLIC PANEL
BUN: 23 mg/dL (ref 6–23)
CALCIUM: 9.9 mg/dL (ref 8.4–10.5)
CO2: 30 mEq/L (ref 19–32)
Chloride: 99 mEq/L (ref 96–112)
Creatinine, Ser: 1.06 mg/dL (ref 0.40–1.20)
GFR: 53.35 mL/min — AB (ref >60.00)
GLUCOSE: 101 mg/dL — AB (ref 70–99)
POTASSIUM: 5 meq/L (ref 3.5–5.1)
SODIUM: 137 meq/L (ref 135–145)

## 2016-06-05 NOTE — Assessment & Plan Note (Signed)
GFR remains stable in 45-55 range on labs at time of visit

## 2016-06-05 NOTE — Assessment & Plan Note (Signed)
Atrial fibrillation discovered during hospitalization. Today admits has had some palpitations and lightheaded feelings in past- possible but unclear if she was going into atrial fibrillation at those times Patient was already on xarelto due to DVT history thankfully She was already on nadolol and remained rate controlled apparently even when in a fib.  Sees cardiology on 06/11/16 with ? Of outpatient echocardiogram per hospitalist notes No changes today

## 2016-06-10 NOTE — Progress Notes (Signed)
Cardiology Office Note NEW PATIENT VISIT  Date:  09/13/2829   ID:  Laura Li, DOB [birth year], MRN [id number]  PCP:  [name], MD  Cardiologist:  New   Dr Lovena Le   Chief Complaint  Patient presents with  . Atrial Fibrillation      History of Present Illness: Laura Li is a 76 y.o. female who presents as new pt  for post hospitalization.  She was referred by Dr. Yong Channel.  She was admitted for sepsis, toxic encephalopathy from UTI.  Other hx of dyslipidemia, GERD, hypothyroidism and hx of breast cancer and DVT.    Atrial fibrillation was discovered in hospital one evening when patient had palpitations and picked up on rhythm strip but BP too low to go up on beta blocker. On anticoagulation. Plan was for cardiology forllow up in 1-2 weeks for echocardiogram but she was well compensated. In retrospect patient states she has had palpitations that she has not admitted to, prior for some time. Possible was having a fib at other times.   Pt states that she has episodes of palpitations since 1994.  They are usually brief and she is just aware of them no other symptoms.  She has no chest pain or SOB.  I explained danger of a fib due to stroke but with xarelto she is protected. But she should be on anticoagulation.    Past Medical History:  Diagnosis Date  . Anemia    from medicaions  . Arthritis   . Chronic kidney disease    some renal impairment from meds  . Depression    states no depression  . Eczema   . Esophageal stricture   . GERD (gastroesophageal reflux disease)   . Headache   . Heart murmur    states it is benign told 30-40 years ago  . History of DVT (deep vein thrombosis)   . HX: breast cancer    melanoma  . Hyperlipidemia   . Hypertension   . Hypothyroidism   . Melanoma ([place])   . OA (osteoarthritis)   . Pneumothorax on right 06/15/2014  . PONV (postoperative nausea and vomiting)   . Wears glasses     Past Surgical History:  Procedure Laterality  Date  . APPENDECTOMY  1967  . BREAST LUMPECTOMY     left breast-benign  . BREAST SURGERY  1977   removal of calcified milk gland right breast  . CHEST TUBE INSERTION  06/15/2014  . CHEST TUBE INSERTION Bilateral 06/15/2014   Procedure: CHEST TUBE INSERTION;  Surgeon: Rolm Bookbinder, MD;  Location: [facility];  Service: General;  Laterality: Bilateral;  . COLONOSCOPY    . LIPOMA EXCISION  1978   right breast  . MASTECTOMY  1998   right-nodes out  . MELANOMA EXCISION  2004   right side of face  . MOHS SURGERY  2005   right left-basal cell  . PORT-A-CATH REMOVAL N/A 10/05/2014   Procedure: REMOVAL PORT-A-CATH;  Surgeon: Rolm Bookbinder, MD;  Location: [facility];  Service: General;  Laterality: N/A;  . PORTACATH PLACEMENT Left 06/15/2014   Procedure: INSERTION PORT-A-CATH;  Surgeon: Rolm Bookbinder, MD;  Location: [facility];  Service: General;  Laterality: Left;  . RADIOACTIVE SEED GUIDED MASTECTOMY WITH AXILLARY SENTINEL LYMPH NODE BIOPSY Left 05/05/2014   Procedure: RADIOACTIVE SEED GUIDED LEFT LUMPECTOMY WITH AXILLARY SENTINEL LYMPH NODE BIOPSY;  Surgeon: Rolm Bookbinder, MD;  Location: [facility];  Service: General;  Laterality: Left;  . TONSILLECTOMY AND  ADENOIDECTOMY  1949  . TOTAL ABDOMINAL HYSTERECTOMY  1992   including ovaries     Current Outpatient Prescriptions  Medication Sig Dispense Refill  . acetaminophen (TYLENOL) 500 MG tablet Take 500 mg by mouth 2 (two) times daily as needed for mild pain. Reported on 07/07/2015    . atorvastatin (LIPITOR) 10 MG tablet Take 1 tablet (10 mg total) by mouth daily. 90 tablet 3  . B Complex-C-E-Zn (B COMPLEX-C-E-ZINC) tablet Take 1 tablet by mouth daily.    . B Complex-C-Folic Acid (STRESS FORMULA PO) Take 1 tablet by mouth every morning.     . Calcium-Vitamin D (CALTRATE 600 PLUS-VIT D PO) Take 1 tablet by mouth 2 (two) times daily.     . cefpodoxime (VANTIN) 200 MG tablet Take 1 tablet (200 mg total) by mouth  2 (two) times daily. 8 tablet 0  . ciclopirox (PENLAC) 8 % solution APPLY ON THE AFFECTED NAILS BID UTD  5  . esomeprazole (NEXIUM) 40 MG capsule TAKE 1 BY MOUTH DAILY 90 capsule 2  . fexofenadine (ALLEGRA) 180 MG tablet Take 180 mg by mouth daily as needed for allergies.     . hypromellose (SYSTANE OVERNIGHT THERAPY) 0.3 % GEL ophthalmic ointment Place 1 application into both eyes at bedtime as needed for dry eyes.    [name] ipratropium (ATROVENT) 0.03 % nasal spray Place 2 sprays into the nose every 12 (twelve) hours. 180 mL 3  . Lactobacillus-Inulin (CULTURELLE DIGESTIVE HEALTH PO) Take 1 capsule by mouth daily.    [name] levothyroxine (SYNTHROID, LEVOTHROID) 75 MCG tablet Take 1 tablet (75 mcg total) by mouth daily. 30 tablet 0  . nadolol (CORGARD) 20 MG tablet TAKE 1/2 TABLET(10 MG) BY MOUTH DAILY 45 tablet 2  . Propylene Glycol (SYSTANE BALANCE) 0.6 % SOLN Apply 1-2 drops to eye daily as needed (dry eyes).     . rivaroxaban (XARELTO) 20 MG TABS tablet Take 1 tablet (20 mg total) by mouth daily with supper. 90 tablet 3  . venlafaxine XR (EFFEXOR-XR) 150 MG 24 hr capsule TAKE 1 CAPSULE(150 MG) BY MOUTH DAILY 90 capsule 2   No current facility-administered medications for this visit.     Allergies:   Dyazide [hydrochlorothiazide w-triamterene]; Niacin; and Penicillins    Social History:  The patient  reports that she quit smoking about 47 years ago. Her smoking use included Cigarettes. She has a 2.25 pack-year smoking history. She has never used smokeless tobacco. She reports that she drinks alcohol. She reports that she does not use drugs.   Family History:  The patient's family history includes Brain cancer in her maternal aunt; Breast cancer in her cousin; Breast cancer (age of onset: 9) in her maternal aunt; Diabetes in her maternal aunt; Endometrial cancer (age of onset: 76) in her mother; Healthy in her sister; Heart attack in her father; Heart disease (age of onset: 15) in her father; Lung  cancer in her paternal aunt.    ROS:  General:no colds or fevers, no weight changes Skin:no rashes or ulcers HEENT:no blurred vision, no congestion CV:see HPI PUL:see HPI GI:no diarrhea constipation or melena, no indigestion GU:no hematuria, no dysuria MS:no joint pain, no claudication Neuro:no syncope, no lightheadedness Endo:no diabetes, + thyroid disease  Wt Readings from Last 3 Encounters:  06/11/16 144 lb 1.9 oz (65.4 kg)  06/04/16 149 lb 3.2 oz (67.7 kg)  05/29/16 143 lb 12.8 oz (65.2 kg)     PHYSICAL EXAM: VS:  BP 136/72 (BP Location: Right Arm, Patient  Position: Sitting, Cuff Size: Normal)   Pulse 66   Ht 5\' 6"  (1.676 m)   Wt 144 lb 1.9 oz (65.4 kg)   BMI 23.26 kg/m  , BMI Body mass index is 23.26 kg/m. General:Pleasant affect, NAD Skin:Warm and dry, brisk capillary refill HEENT:normocephalic, sclera clear, mucus membranes moist Neck:supple, no JVD, no bruits  Heart:S1S2 RRR without murmur, gallup, rub or click Lungs:clear without rales, rhonchi, or wheezes OIZ:TIWP, non tender, + BS, do not palpate liver spleen or masses Ext:no lower ext edema, 2+ pedal pulses, 2+ radial pulses Neuro:alert and oriented, MAE, follows commands, + facial symmetry    EKG:  EKG is ordered today. The ekg ordered today demonstrates SR LAFB and LVH.  No acute changes from previous.     Recent Labs: 05/28/2016: ALT 19 05/30/2016: TSH 5.304 06/04/2016: BUN 23; Creatinine, Ser 1.06; Hemoglobin 12.2; Platelets 226.0; Potassium 5.0; Sodium 137    Lipid Panel    Component Value Date/Time   CHOL 278 (H) 11/28/2015 0849   TRIG 270.0 (H) 11/28/2015 0849   TRIG 97 02/06/2006 1059   HDL 36.60 (L) 11/28/2015 0849   CHOLHDL 8 11/28/2015 0849   VLDL 54.0 (H) 11/28/2015 0849   LDLCALC 124 (H) 06/21/2013 0944   LDLDIRECT 119.0 04/30/2016 1418       Other studies Reviewed: Additional studies/ records that were reviewed today include: OV notes, hospital notes and tele strips of a  fib.   ASSESSMENT AND PLAN:  1.  PAF- on xarelto for DVT but she continue anticoagulation.  Discussed with Dr. Lovena Le.  Will order an echo.  No other changes in meds as pt is not bothered by short bursts of PAF.  She will follow up with Dr. Lovena Le or myself. Recent labs stable.   2.  Recent sepsis, now recovering.  UTI   3.  HTN controled    Current medicines are reviewed with the patient today.  The patient Has no concerns regarding medicines.  The following changes have been made:  See above Labs/ tests ordered today include:see above  Disposition:   FU:  see above  Signed, Cecilie Kicks, NP  06/11/2016 6:39 PM    [facility] [address] [place] [place], [place] Phone: [phone]; Fax: [phone]

## 2016-06-11 ENCOUNTER — Ambulatory Visit (INDEPENDENT_AMBULATORY_CARE_PROVIDER_SITE_OTHER): Payer: Medicare Other | Admitting: Cardiology

## 2016-06-11 ENCOUNTER — Telehealth: Payer: Self-pay

## 2016-06-11 ENCOUNTER — Encounter: Payer: Self-pay | Admitting: Cardiology

## 2016-06-11 VITALS — BP 136/72 | HR 66 | Ht 66.0 in | Wt 144.1 lb

## 2016-06-11 DIAGNOSIS — Z7901 Long term (current) use of anticoagulants: Secondary | ICD-10-CM

## 2016-06-11 DIAGNOSIS — I480 Paroxysmal atrial fibrillation: Secondary | ICD-10-CM

## 2016-06-11 DIAGNOSIS — N390 Urinary tract infection, site not specified: Secondary | ICD-10-CM | POA: Diagnosis not present

## 2016-06-11 DIAGNOSIS — I10 Essential (primary) hypertension: Secondary | ICD-10-CM

## 2016-06-11 NOTE — Addendum Note (Signed)
Addended by: Tomi Likens on: 06/11/2016 12:43 PM   Modules accepted: Orders

## 2016-06-11 NOTE — Telephone Encounter (Signed)
Would stop the mucinex. delysm would be ok to try again.  Give this another week and call us back if cough persists. Definitely need to know if any fever or shortness of breath.

## 2016-06-11 NOTE — Patient Instructions (Signed)
Medication Instructions:  Your physician recommends that you continue on your current medications as directed. Please refer to the Current Medication list given to you today.   Labwork: NONE ORDERED  Testing/Procedures: 1. Your physician has requested that you have an echocardiogram. Echocardiography is a painless test that uses sound waves to create images of your heart. It provides your doctor with information about the size and shape of your heart and how well your heart's chambers and valves are working. This procedure takes approximately one hour. There are no restrictions for this procedure.    Follow-Up: 1. Cecilie Kicks, NP IN 1 MONTH  2. Your physician wants you to follow-up in: [place] will receive a reminder letter in the mail two months in advance. If you don't receive a letter, please call our office to schedule the follow-up appointment.   Any Other Special Instructions Will Be Listed Below (If Applicable).     If you need a refill on your cardiac medications before your next appointment, please call your pharmacy.

## 2016-06-11 NOTE — Telephone Encounter (Signed)
Pt in to the office this morning stating she has had a nonproductive cough for a while. She initially took Delsym but switched over to Mucinex. She feels like it is just drying her out and not really helping her cough. She stated the cough is not necessarily keeping her up at night. She wants to know if she should continue the medication. Please advise.

## 2016-06-12 LAB — URINE CULTURE

## 2016-06-12 NOTE — Telephone Encounter (Signed)
Spoke with patient who verbalized understanding.

## 2016-06-25 ENCOUNTER — Ambulatory Visit (HOSPITAL_COMMUNITY): Payer: Medicare Other | Attending: Cardiovascular Disease

## 2016-06-25 ENCOUNTER — Other Ambulatory Visit: Payer: Self-pay

## 2016-06-25 ENCOUNTER — Encounter: Payer: Self-pay | Admitting: Cardiology

## 2016-06-25 DIAGNOSIS — I480 Paroxysmal atrial fibrillation: Secondary | ICD-10-CM

## 2016-06-25 DIAGNOSIS — I5030 Unspecified diastolic (congestive) heart failure: Secondary | ICD-10-CM | POA: Diagnosis not present

## 2016-06-27 ENCOUNTER — Other Ambulatory Visit: Payer: Self-pay

## 2016-06-27 MED ORDER — NADOLOL 20 MG PO TABS: ORAL_TABLET | ORAL | 2 refills | Status: DC

## 2016-06-28 ENCOUNTER — Other Ambulatory Visit (HOSPITAL_BASED_OUTPATIENT_CLINIC_OR_DEPARTMENT_OTHER): Payer: Medicare Other

## 2016-06-28 ENCOUNTER — Telehealth: Payer: Self-pay | Admitting: Cardiology

## 2016-06-28 DIAGNOSIS — C50512 Malignant neoplasm of lower-outer quadrant of left female breast: Secondary | ICD-10-CM | POA: Diagnosis present

## 2016-06-28 DIAGNOSIS — Z170 Estrogen receptor positive status [ER+]: Principal | ICD-10-CM

## 2016-06-28 DIAGNOSIS — C50911 Malignant neoplasm of unspecified site of right female breast: Secondary | ICD-10-CM

## 2016-06-28 DIAGNOSIS — Z171 Estrogen receptor negative status [ER-]: Secondary | ICD-10-CM

## 2016-06-28 LAB — COMPREHENSIVE METABOLIC PANEL
ALT: 17 U/L (ref 0–55)
AST: 21 U/L (ref 5–34)
Albumin: 4.2 g/dL (ref 3.5–5.0)
Alkaline Phosphatase: 66 U/L (ref 40–150)
Anion Gap: 11 mEq/L (ref 3–11)
BUN: 16.2 mg/dL (ref 7.0–26.0)
CHLORIDE: 104 meq/L (ref 98–109)
CO2: 26 meq/L (ref 22–29)
Calcium: 9.9 mg/dL (ref 8.4–10.4)
Creatinine: 1 mg/dL (ref 0.6–1.1)
EGFR: 54 mL/min/{1.73_m2} — ABNORMAL LOW (ref >90)
Glucose: 106 mg/dl (ref 70–140)
POTASSIUM: 4.4 meq/L (ref 3.5–5.1)
Sodium: 141 mEq/L (ref 136–145)
Total Bilirubin: 0.47 mg/dL (ref 0.20–1.20)
Total Protein: 7.6 g/dL (ref 6.4–8.3)

## 2016-06-28 LAB — CBC WITH DIFFERENTIAL/PLATELET
BASO%: 0.5 % (ref 0.0–2.0)
BASOS ABS: 0 10*3/uL (ref 0.0–0.1)
EOS%: 4 % (ref 0.0–7.0)
Eosinophils Absolute: 0.2 10*3/uL (ref 0.0–0.5)
HCT: 37.6 % (ref 34.8–46.6)
HGB: 12.9 g/dL (ref 11.6–15.9)
LYMPH%: 23.2 % (ref 14.0–49.7)
MCH: 31.8 pg (ref 25.1–34.0)
MCHC: 34.3 g/dL (ref 31.5–36.0)
MCV: 92.7 fL (ref 79.5–101.0)
MONO#: 0.3 10*3/uL (ref 0.1–0.9)
MONO%: 6.9 % (ref 0.0–14.0)
NEUT#: 3.1 10*3/uL (ref 1.5–6.5)
NEUT%: 65.4 % (ref 38.4–76.8)
Platelets: 183 10*3/uL (ref 145–400)
RBC: 4.06 10*6/uL (ref 3.70–5.45)
RDW: 13.2 % (ref 11.2–14.5)
WBC: 4.7 10*3/uL (ref 3.9–10.3)
lymph#: 1.1 10*3/uL (ref 0.9–3.3)

## 2016-06-28 NOTE — Telephone Encounter (Signed)
Returned pts call and went over her echo results. Pt verbalized understanding.

## 2016-06-28 NOTE — Telephone Encounter (Signed)
New message     Pt is returning your call about her test results

## 2016-06-28 NOTE — Telephone Encounter (Signed)
-----   Message from Isaiah Serge, NP sent at 06/26/2016  6:12 AM EDT ----- Echo of heart is normal except for mild stiffness of left ventricle- controlling BP should help with this.  No wall motion abnormalities.

## 2016-07-02 ENCOUNTER — Telehealth: Payer: Self-pay | Admitting: Oncology

## 2016-07-02 ENCOUNTER — Ambulatory Visit (HOSPITAL_BASED_OUTPATIENT_CLINIC_OR_DEPARTMENT_OTHER): Payer: Medicare Other | Admitting: Oncology

## 2016-07-02 VITALS — BP 141/73 | HR 75 | Temp 97.7°F | Resp 17 | Ht 66.0 in | Wt 145.4 lb

## 2016-07-02 DIAGNOSIS — Z171 Estrogen receptor negative status [ER-]: Secondary | ICD-10-CM | POA: Diagnosis not present

## 2016-07-02 DIAGNOSIS — C50512 Malignant neoplasm of lower-outer quadrant of left female breast: Secondary | ICD-10-CM | POA: Diagnosis not present

## 2016-07-02 NOTE — Progress Notes (Signed)
[facility]  [phone] Fax:([phone]) [phone]     ID: Laura Li DOB: 1941/01/06  MR#: [id number]  WBI#:[id number]  Patient Care Team: Shelva Majestic, MD as PCP - General (Family Medicine) Emelia Loron, MD as Consulting Physician (General Surgery) Lurline Hare, MD (Inactive) as Consulting Physician (Radiation Oncology) Lowella Dell, MD as Consulting Physician (Oncology) Salomon Fick, NP as Nurse Practitioner (Hematology and Oncology) PCP: Tana Conch, MD GYN: Carrington Clamp SU: Emelia Loron OTHER MD: Lurline Hare, Laura Li  CHIEF COMPLAINT: 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 Non-of the 25 lymph nodes removed from the right axilla were involved. The tumor was estrogen and progesterone receptor positive, HER-2 not amplified. She was treated adjuvantly with doxorubicin and cyclophosphamide 4, then received 5 years of antiestrogen therapy (initially tamoxifen, later letrozole). There has been no evidence of disease recurrence on the right.  More recently, Laura Li had routine left screening mammography [facility] March 08 2014. This showed a suspicious area of architectural distortion in the inferior portion of the left breast. On 03/29/2014 Laura Li underwent left diagnostic mammography with ultrasonography. Spot compression views confirmed the presence 7 area of distortion in the lower central left breast, which was not palpable by physical exam (there was a prior biopsy scar in the upper outer quadrant of the left breast). Ultrasound confirmed a hypoechoic spiculated mass at the 6:00 location in the left breast, measuring maximally 1.3 cm. Sonography of the left axilla was  negative.  Biopsy of the left breast mass in question 120 10/29/2014 showed ([id number]) an invasive breast cancer with squamous features. It was estrogen and progesterone receptor negative. There was no HER-2 amplification, the signals ratio being 0.82 and the number per cell 1.80. The MIB-1 was 10%.  The patient has met with Dr. Dwain Sarna in surgery and Dr. Michell Heinrich in radiation oncology. The patient understands there is no survival difference between lumpectomy and radiation compared with mastectomy. The patient will need sentinel lymph node sampling with either of those procedures. If she does have a lumpectomy, she will benefit from adjuvant radiation. The patient has been set up for genetics testing and this may affect her ultimate surgical decision.  Her subsequent history is as detailed below  INTERVAL HISTORY: Laura Li returns today for follow-up of her estrogen receptor negative breast cancer. Interval history is significant for having had an episode of urosepsis which she says landed her in the hospital with a great deal of confusion. She had a head CT scan which was negative. She says she is now recovered although she still doesn't feel quite as sharp as a used to be.  REVIEW OF SYSTEMS: She is exercising about 2 days a week at present. She does all her activities of daily living and drove herself here today. Overall a detailed review of systems was otherwise negative by her account  PAST MEDICAL HISTORY: Past Medical History:  Diagnosis Date  . Anemia    from medicaions  . Arthritis   . Chronic kidney disease    some renal impairment from meds  . Depression    states no depression  . Eczema   . Esophageal stricture   . GERD (gastroesophageal reflux disease)   . Headache   . Heart murmur    states  it is benign told 30-40 years ago  . History of DVT (deep vein thrombosis)   . HX: breast cancer    melanoma  . Hyperlipidemia   . Hypertension   . Hypothyroidism   . Melanoma  ([place])   . OA (osteoarthritis)   . Pneumothorax on right 06/15/2014  . PONV (postoperative nausea and vomiting)   . Wears glasses     PAST SURGICAL HISTORY: Past Surgical History:  Procedure Laterality Date  . APPENDECTOMY  1967  . BREAST LUMPECTOMY     left breast-benign  . BREAST SURGERY  1977   removal of calcified milk gland right breast  . CHEST TUBE INSERTION  06/15/2014  . CHEST TUBE INSERTION Bilateral 06/15/2014   Procedure: CHEST TUBE INSERTION;  Surgeon: Rolm Bookbinder, MD;  Location: [place];  Service: General;  Laterality: Bilateral;  . COLONOSCOPY    . LIPOMA EXCISION  1978   right breast  . MASTECTOMY  1998   right-nodes out  . MELANOMA EXCISION  2004   right side of face  . MOHS SURGERY  2005   right left-basal cell  . PORT-A-CATH REMOVAL N/A 10/05/2014   Procedure: REMOVAL PORT-A-CATH;  Surgeon: Rolm Bookbinder, MD;  Location: [place];  Service: General;  Laterality: N/A;  . PORTACATH PLACEMENT Left 06/15/2014   Procedure: INSERTION PORT-A-CATH;  Surgeon: Rolm Bookbinder, MD;  Location: [place];  Service: General;  Laterality: Left;  . RADIOACTIVE SEED GUIDED MASTECTOMY WITH AXILLARY SENTINEL LYMPH NODE BIOPSY Left 05/05/2014   Procedure: RADIOACTIVE SEED GUIDED LEFT LUMPECTOMY WITH AXILLARY SENTINEL LYMPH NODE BIOPSY;  Surgeon: Rolm Bookbinder, MD;  Location: [place];  Service: General;  Laterality: Left;  . TONSILLECTOMY AND ADENOIDECTOMY  1949  . TOTAL ABDOMINAL HYSTERECTOMY  1992   including ovaries    FAMILY HISTORY Family History  Problem Relation Age of Onset  . Heart disease Father 56    smoker  . Heart attack Father   . Endometrial cancer Mother 97  . Breast cancer Cousin     x 3 cousins with breast cancer (one at 6, one at 30 and one at 9)  . Lung cancer Paternal Aunt     smoker  . Brain cancer Maternal Aunt   . Diabetes Maternal Aunt     ???  . Breast cancer Maternal Aunt 51  . Healthy Sister   .  Colon cancer Neg Hx   The patient's father died at the age of 67 from a myocardial infarction. The patient's mother died at the age of 5 from metastatic endometrial cancer which had been diagnosed 2 years before. The patient had no brothers, one sister. The patient's mother's sister was diagnosed with breast cancer but the patient does not know at what age. A second maternal aunt was diagnosed with brain cancer. The patient has 3 cousins on her mother's side diagnosed with breast cancer, one of them under the age of 23. On the father's side there is a history of lung and cervical cancer  GYNECOLOGIC HISTORY:  No LMP recorded. Patient has had a hysterectomy. Menarche age 38, first live birth age 89. The patient is GX P1. She underwent hysterectomy with bilateral salpingo-oophorectomy in 1991. She used hormone replacement for approximately 7 years until 1998, when she had her right-sided breast cancer. She used oral contraceptives for approximately one year with no complications. 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 She is widowed and lives alone with no pets. Her main hobby is painting. Her daughter Laura Li lives in [place] and is also a homemaker. The patient has 2 grandchildren. She attends [employer]. The patient's daughter Laura Li is her healthcare power of attorney. Laura Li can be reached at [phone]   HEALTH MAINTENANCE: Social History  Substance Use Topics  . Smoking status: Former Smoker    Packs/day: 0.75    Years: 3.00    Types: Cigarettes    Quit date: 03/11/1969  . Smokeless tobacco: Never Used  . Alcohol use 0.0 oz/week     Comment: occasional wine     Colonoscopy: 20/10/[facility]  MLY:YTKPTW post hysterectomy  Bone density:2014/osteopenia  Lipid panel:  Allergies   Allergen Reactions  . Dyazide [Hydrochlorothiazide W-Triamterene]     Presumption: drug induced vasculitis  . Niacin Other (See Comments)    flushing  . Penicillins Diarrhea and Nausea And Vomiting    Current Outpatient Prescriptions  Medication Sig Dispense Refill  . acetaminophen (TYLENOL) 500 MG tablet Take 500 mg by mouth 2 (two) times daily as needed for mild pain. Reported on 07/07/2015    . atorvastatin (LIPITOR) 10 MG tablet Take 1 tablet (10 mg total) by mouth daily. 90 tablet 3  . B Complex-C-E-Zn (B COMPLEX-C-E-ZINC) tablet Take 1 tablet by mouth daily.    . B Complex-C-Folic Acid (STRESS FORMULA PO) Take 1 tablet by mouth every morning.     . Calcium-Vitamin D (CALTRATE 600 PLUS-VIT D PO) Take 1 tablet by mouth 2 (two) times daily.     . cefpodoxime (VANTIN) 200 MG tablet Take 1 tablet (200 mg total) by mouth 2 (two) times daily. 8 tablet 0  . ciclopirox (PENLAC) 8 % solution APPLY ON THE AFFECTED NAILS BID UTD  5  . esomeprazole (NEXIUM) 40 MG capsule TAKE 1 BY MOUTH DAILY 90 capsule 2  . fexofenadine (ALLEGRA) 180 MG tablet Take 180 mg by mouth daily as needed for allergies.     . hypromellose (SYSTANE OVERNIGHT THERAPY) 0.3 % GEL ophthalmic ointment Place 1 application into both eyes at bedtime as needed for dry eyes.    [name] ipratropium (ATROVENT) 0.03 % nasal spray Place 2 sprays into the nose every 12 (twelve) hours. 180 mL 3  . Lactobacillus-Inulin (CULTURELLE DIGESTIVE HEALTH PO) Take 1 capsule by mouth daily.    [name] levothyroxine (SYNTHROID, LEVOTHROID) 75 MCG tablet Take 1 tablet (75 mcg total) by mouth daily. 30 tablet 0  . nadolol (CORGARD) 20 MG tablet TAKE 1/2 TABLET(10 MG) BY MOUTH DAILY 45 tablet 2  . Propylene Glycol (SYSTANE BALANCE) 0.6 % SOLN Apply 1-2 drops to eye daily as needed (dry eyes).     . rivaroxaban (XARELTO) 20 MG TABS tablet Take 1 tablet (20 mg total) by mouth daily with supper. 90 tablet 3  . venlafaxine XR (EFFEXOR-XR) 150 MG 24 hr capsule TAKE 1  CAPSULE(150 MG) BY MOUTH DAILY 90 capsule 2   No current facility-administered medications for this visit.     OBJECTIVE: Older white womanIn no acute distress  Vitals:   07/02/16 1445  BP: (!) 141/73  Pulse: 75  Resp: 17  Temp: 97.7 F (36.5 C)     Body mass index is 23.47 kg/m.    ECOG FS:1 - Symptomatic but completely ambulatory  Sclerae unicteric, pupils round and equal  Oropharynx clear and moist No cervical or supraclavicular adenopathy Lungs no rales or rhonchi Heart regular rate and rhythm Abd soft, nontender, positive bowel sounds MSK no focal spinal tenderness, no upper extremity lymphedema Neuro: nonfocal, well oriented 3, appropriate affect Breasts: The right breast is status post mastectomy. There is no evidence of chest wall recurrence. Left breas has undergone lumpectomy and radiation. There is no evidence of local recurrence. Both axillae are benign.   LAB RESULTS:  CMP     Component Value Date/Time   NA 141 06/28/2016 1118   K 4.4 06/28/2016 1118   CL 99 06/04/2016 1636   CO2 26 06/28/2016 1118   GLUCOSE 106 06/28/2016 1118   GLUCOSE 88 02/06/2006 1059   BUN 16.2 06/28/2016 1118   CREATININE 1.0 06/28/2016 1118   CALCIUM 9.9 06/28/2016 1118   PROT 7.6 06/28/2016 1118   ALBUMIN 4.2 06/28/2016 1118   AST 21 06/28/2016 1118   ALT 17 06/28/2016 1118   ALKPHOS 66 06/28/2016 1118   BILITOT 0.47 06/28/2016 1118   GFRNONAA >60 05/31/2016 0907   GFRAA >60 05/31/2016 0907    INo results found for: SPEP, UPEP  Lab Results  Component Value Date   WBC 4.7 06/28/2016   NEUTROABS 3.1 06/28/2016   HGB 12.9 06/28/2016   HCT 37.6 06/28/2016   MCV 92.7 06/28/2016   PLT 183 06/28/2016      Chemistry      Component Value Date/Time   NA 141 06/28/2016 1118   K 4.4 06/28/2016 1118   CL 99 06/04/2016 1636   CO2 26 06/28/2016 1118   BUN 16.2 06/28/2016 1118   CREATININE 1.0 06/28/2016 1118      Component Value Date/Time   CALCIUM 9.9 06/28/2016 1118    ALKPHOS 66 06/28/2016 1118   AST 21 06/28/2016 1118   ALT 17 06/28/2016 1118   BILITOT 0.47 06/28/2016 1118       No results found for: LABCA2  No components found for: LABCA125  No results for input(s): INR in the last 168 hours.  Urinalysis    Component Value Date/Time   COLORURINE YELLOW 05/28/2016 2212   APPEARANCEUR CLEAR 05/28/2016 2212   LABSPEC 1.014 05/28/2016 2212   LABSPEC 1.005 08/09/2014 1052   PHURINE 7.0 05/28/2016 2212   GLUCOSEU NEGATIVE 05/28/2016 2212   GLUCOSEU Negative 08/09/2014 1052   HGBUR NEGATIVE 05/28/2016 2212   BILIRUBINUR N 06/04/2016 1714   BILIRUBINUR Negative 08/09/2014 1052   KETONESUR NEGATIVE 05/28/2016 2212   PROTEINUR N 06/04/2016 1714   PROTEINUR NEGATIVE 05/28/2016 2212   UROBILINOGEN 0.2 06/04/2016 1714   UROBILINOGEN 0.2 08/09/2014 1052   NITRITE N 06/04/2016 1714   NITRITE NEGATIVE 05/28/2016 2212   LEUKOCYTESUR Negative 06/04/2016 1714   LEUKOCYTESUR Negative 08/09/2014 1052    STUDIES: No results found.  ASSESSMENT: 76 y.o. [place] woman  (1) status post right modified radical mastectomy November 1998 for a pT2 pN0, stage IIA invasive ductal carcinoma, grade 2, estrogen and progesterone receptor positive, HER-2 not amplified  (a) adjuvant chemotherapy consisted of doxorubicin and cyclophosphamide 4  (b) adjuvant anti-estrogens consisted of tamoxifen, then letrozole, for a total of 5 years  (2) status post left breast lower outer quadrant biopsy 04/07/2014 for a clinical T1c N0,stage IA  invasive ductal carcinoma, with squamous features, estrogen and progesterone receptor negative, HER-2 not amplified, with an MIB-1 of 10%   (3) left lumpectomy and sentinel lymph node biopsy 05/05/2014 showed aT1c pN0, stage IA invasive ductal carcinoma, grade 1,  triple negative, with close but negative margins.   (4) Mammaprint classifies the tumor as basal like, high risk, and predicts a 30% chance of recurrence within 10 years  with local treatment only.   (5) start of treatment delayed because of bilateral pneumothoraces after port placement. Adjuvant chemotherapy fiinally started 07/11/2014 consisting of carboplatin and docetaxel given every 21 days 4, completed 09/13/2014  (6) adjuvant radiation 10/31/2014-12/15/2014:  Left breast/ 45 Gy at 1.8 Gy per fraction x 25 fractions.  Left breast boost/ 16 Gy at 2 Gy per fraction x 8 fractions  (7) genetics testing March 2016 showed no deleterious mutation in the [employer] panel  [[facility]] including sequencing and rearrangement analysis for the following 24 genes:ATM, BARD1, BRCA1, BRCA2, BRIP1, CDH1, CHEK2, EPCAM, MLH1, MRE11A, MSH2, MSH6, MUTYH, NBN, NF1, PALB2, PMS2, PTEN, RAD50, RAD51C, RAD51D, SMARCA4, STK11, and TP53.   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   (8) osteopenia by bone density 06/29/2015 with [place] T score of -2.4  (a) intolerant of Fosamax   PLAN: Lillyth is now a little over 2 years out from definitive surgery for her breast cancer. This is particularly favorable because estrogen receptor negative breast cancers, if they are to recur, tend to recur early.  I think she has fairly fully recovered from her episode of sepsis but she still seemed a little circumstantial in some of her answers today.  From a breast cancer point of view however all she requires further follow-up and she will see me again in one year. Shortly after her next mammogram.  She knows to call for any problems that may develop before her next visit. Chauncey Cruel, MD   07/02/2016 3:51 PM

## 2016-07-02 NOTE — Telephone Encounter (Signed)
Gave patient avs report and appointments for April 2019.  °

## 2016-07-14 NOTE — Progress Notes (Signed)
Cardiology Office Note   Date:  08/14/628   ID:  Laura Li, DOB [birth year], MRN [id number]  PCP:  Marin Olp, MD  Cardiologist:  Dr. Lovena Le    Chief Complaint  Patient presents with  . Atrial Fibrillation      History of Present Illness: Laura Li is a 76 y.o. female who presents for her PAF,  She is on Xarelto for DVT but continues for a fib, was last seen 06/11/16 and in [place]. Discussed with Dr.Taylor and echo planned.    Echo  mild LVH EF 60-65%, no RWMA, G1DD.  PA Pressure was normal,  She had been hospitalized  For sepsis UTI, recovered. And HTN.  Atrial fibrillation was discovered in hospital one evening when patient had palpitations and picked up on rhythm strip but BP too low to go up on beta blocker. On anticoagulation. Plan was for cardiology forllow up  In retrospect patient states she has had palpitations that she has not admitted to, prior for some time. Possible was having a fib at other times.   Pt stated on last visit that she has episodes of palpitations since 1994.  They are usually brief and she is just aware of them no other symptoms.  I explained danger of a fib due to stroke but with xarelto she is protected. But she should be on anticoagulation.    Today she has touch of blood on paper from urine and will see PCP.  No bleeding otherwise.  We reviewed her echo.  It looks good.  Reviewed with pt if she has head injury or sudden severe H/A she should go to ER.  If more symptomatic atrial fib to call us.  She usually is not symptomatic but is aware of it.  Today she has no complaints or SOB. She feels well and is taking her Xarelto.   Past Medical History:  Diagnosis Date  . Anemia    from medicaions  . Arthritis   . Chronic kidney disease    some renal impairment from meds  . Depression    states no depression  . Eczema   . Esophageal stricture   . GERD (gastroesophageal reflux disease)   . Headache   . Heart murmur    states it is  benign told 30-40 years ago  . History of DVT (deep vein thrombosis)   . HX: breast cancer    melanoma  . Hyperlipidemia   . Hypertension   . Hypothyroidism   . Melanoma ([place])   . OA (osteoarthritis)   . Pneumothorax on right 06/15/2014  . PONV (postoperative nausea and vomiting)   . Wears glasses     Past Surgical History:  Procedure Laterality Date  . APPENDECTOMY  1967  . BREAST LUMPECTOMY     left breast-benign  . BREAST SURGERY  1977   removal of calcified milk gland right breast  . CHEST TUBE INSERTION  06/15/2014  . CHEST TUBE INSERTION Bilateral 06/15/2014   Procedure: CHEST TUBE INSERTION;  Surgeon: Rolm Bookbinder, MD;  Location: [facility];  Service: General;  Laterality: Bilateral;  . COLONOSCOPY    . LIPOMA EXCISION  1978   right breast  . MASTECTOMY  1998   right-nodes out  . MELANOMA EXCISION  2004   right side of face  . MOHS SURGERY  2005   right left-basal cell  . PORT-A-CATH REMOVAL N/A 10/05/2014   Procedure: REMOVAL PORT-A-CATH;  Surgeon: Rolm Bookbinder, MD;  Location: [facility];  Service: General;  Laterality: N/A;  . PORTACATH PLACEMENT Left 06/15/2014   Procedure: INSERTION PORT-A-CATH;  Surgeon: Rolm Bookbinder, MD;  Location: [facility];  Service: General;  Laterality: Left;  . RADIOACTIVE SEED GUIDED MASTECTOMY WITH AXILLARY SENTINEL LYMPH NODE BIOPSY Left 05/05/2014   Procedure: RADIOACTIVE SEED GUIDED LEFT LUMPECTOMY WITH AXILLARY SENTINEL LYMPH NODE BIOPSY;  Surgeon: Rolm Bookbinder, MD;  Location: [facility];  Service: General;  Laterality: Left;  . TONSILLECTOMY AND ADENOIDECTOMY  1949  . TOTAL ABDOMINAL HYSTERECTOMY  1992   including ovaries     Current Outpatient Prescriptions  Medication Sig Dispense Refill  . acetaminophen (TYLENOL) 500 MG tablet Take 500 mg by mouth 2 (two) times daily as needed for mild pain. Reported on 07/07/2015    . atorvastatin (LIPITOR) 10 MG tablet Take 1 tablet (10 mg total) by mouth  daily. 90 tablet 3  . B Complex-C-E-Zn (B COMPLEX-C-E-ZINC) tablet Take 1 tablet by mouth daily.    . B Complex-C-Folic Acid (STRESS FORMULA PO) Take 1 tablet by mouth every morning.     . Calcium-Vitamin D (CALTRATE 600 PLUS-VIT D PO) Take 1 tablet by mouth 2 (two) times daily.     . carboxymethylcellulose (REFRESH PLUS) 0.5 % SOLN 2 drops 3 (three) times daily as needed.    . ciclopirox (PENLAC) 8 % solution APPLY ON THE AFFECTED NAILS BID UTD  5  . esomeprazole (NEXIUM) 40 MG capsule TAKE 1 BY MOUTH DAILY 90 capsule 2  . fexofenadine (ALLEGRA) 180 MG tablet Take 180 mg by mouth daily as needed for allergies.     [name] ipratropium (ATROVENT) 0.03 % nasal spray Place 2 sprays into the nose every 12 (twelve) hours. 180 mL 3  . levothyroxine (SYNTHROID, LEVOTHROID) 75 MCG tablet Take 1 tablet (75 mcg total) by mouth daily. 30 tablet 0  . nadolol (CORGARD) 20 MG tablet TAKE 1/2 TABLET(10 MG) BY MOUTH DAILY 45 tablet 2  . Propylene Glycol (SYSTANE BALANCE) 0.6 % SOLN Apply 1-2 drops to eye daily as needed (dry eyes).     . rivaroxaban (XARELTO) 20 MG TABS tablet Take 1 tablet (20 mg total) by mouth daily with supper. 90 tablet 3  . venlafaxine XR (EFFEXOR-XR) 150 MG 24 hr capsule TAKE 1 CAPSULE(150 MG) BY MOUTH DAILY 90 capsule 2  . Lactobacillus-Inulin (CULTURELLE DIGESTIVE HEALTH PO) Take 1 capsule by mouth daily.     No current facility-administered medications for this visit.     Allergies:   Dyazide [hydrochlorothiazide w-triamterene]; Niacin; and Penicillins    Social History:  The patient  reports that she quit smoking about 47 years ago. Her smoking use included Cigarettes. She has a 2.25 pack-year smoking history. She has never used smokeless tobacco. She reports that she drinks alcohol. She reports that she does not use drugs.   Family History:  The patient's family history includes Brain cancer in her maternal aunt; Breast cancer in her cousin; Breast cancer (age of onset: 64) in her  maternal aunt; Diabetes in her maternal aunt; Endometrial cancer (age of onset: 49) in her mother; Healthy in her sister; Heart attack in her father; Heart disease (age of onset: 41) in her father; Lung cancer in her paternal aunt.    ROS:  General:no colds or fevers, no weight changes Skin:no rashes or ulcers HEENT:no blurred vision, no congestion CV:see HPI PUL:see HPI GI:no diarrhea constipation or melena, no indigestion GU:no hematuria, no dysuria MS:no joint pain, no claudication Neuro:no syncope,  no lightheadedness Endo:no diabetes, no thyroid disease   Wt Readings from Last 3 Encounters:  07/15/16 145 lb (65.8 kg)  07/02/16 145 lb 6.4 oz (66 kg)  06/11/16 144 lb 1.9 oz (65.4 kg)     PHYSICAL EXAM: VS:  BP 118/70   Pulse 76   Ht 5\' 6"  (1.676 m)   Wt 145 lb (65.8 kg)   SpO2 98%   BMI 23.40 kg/m  , BMI Body mass index is 23.4 kg/m. General:Pleasant affect, NAD Skin:Warm and dry, brisk capillary refill HEENT:normocephalic, sclera clear, mucus membranes moist Neck:supple, no JVD, no bruits  Heart:S1S2 RRR without murmur, gallup, rub or click Lungs:clear without rales, rhonchi, or wheezes WPV:XYIA, non tender, + BS, do not palpate liver spleen or masses Ext:no lower ext edema, 2+ pedal pulses, 2+ radial pulses Neuro:alert and oriented X 3, MAE, follows commands, + facial symmetry  EKG:  EKG is NOT ordered today.   Recent Labs: 05/30/2016: TSH 5.304 06/28/2016: ALT 17; BUN 16.2; Creatinine 1.0; HGB 12.9; Platelets 183; Potassium 4.4; Sodium 141    Lipid Panel    Component Value Date/Time   CHOL 278 (H) 11/28/2015 0849   TRIG 270.0 (H) 11/28/2015 0849   TRIG 97 02/06/2006 1059   HDL 36.60 (L) 11/28/2015 0849   CHOLHDL 8 11/28/2015 0849   VLDL 54.0 (H) 11/28/2015 0849   LDLCALC 124 (H) 06/21/2013 0944   LDLDIRECT 119.0 04/30/2016 1418       Other studies Reviewed: Additional studies/ records that were reviewed today include: . Echo 06/25/16 Study  Conclusions  - Left ventricle: The cavity size was normal. Wall thickness was   increased in a pattern of mild LVH. Systolic function was normal.   The estimated ejection fraction was in the range of 60% to 65%.   Wall motion was normal; there were no regional wall motion   abnormalities. Doppler parameters are consistent with abnormal   left ventricular relaxation (grade 1 diastolic dysfunction).   Doppler parameters are consistent with high ventricular filling   pressure. - Aortic valve: There was no regurgitation. - Mitral valve: Transvalvular velocity was within the normal range.   There was no evidence for stenosis. There was no regurgitation. - Right ventricle: The cavity size was normal. Wall thickness was   normal. Systolic function was normal. - Tricuspid valve: There was no regurgitation. - Pulmonary arteries: Systolic pressure was within the normal   range. PA peak pressure: 25 mm Hg (S). - Global longitudinal study -18.3%.  ASSESSMENT AND PLAN:  1.  PAF she is on Xarelto for DVT as well.  Echo without acute issues.  Reviewed with pt today.  2. DVT on Xarelto.   3. HTN controlled.  She will follow up with Dr. Lovena Le in 6 months.      Current medicines are reviewed with the patient today.  The patient Has no concerns regarding medicines.  The following changes have been made:  See above Labs/ tests ordered today include:see above  Disposition:   FU:  see above  Signed, Cecilie Kicks, NP  07/15/2016 10:47 AM    [facility], [place], [place], [place] Phone: [phone]; Fax: [phone]

## 2016-07-15 ENCOUNTER — Encounter: Payer: Self-pay | Admitting: Cardiology

## 2016-07-15 ENCOUNTER — Ambulatory Visit (INDEPENDENT_AMBULATORY_CARE_PROVIDER_SITE_OTHER): Payer: Medicare Other | Admitting: Cardiology

## 2016-07-15 VITALS — BP 118/70 | HR 76 | Ht 66.0 in | Wt 145.0 lb

## 2016-07-15 DIAGNOSIS — Z7901 Long term (current) use of anticoagulants: Secondary | ICD-10-CM | POA: Diagnosis not present

## 2016-07-15 DIAGNOSIS — I10 Essential (primary) hypertension: Secondary | ICD-10-CM | POA: Diagnosis not present

## 2016-07-15 DIAGNOSIS — I480 Paroxysmal atrial fibrillation: Secondary | ICD-10-CM | POA: Diagnosis not present

## 2016-07-15 NOTE — Patient Instructions (Addendum)
Medication Instructions:  Your physician has recommended you make the following change in your medication: Please keep taking your Xarelto  Labwork: None Ordered   Testing/Procedures: None Ordered   Follow-Up: Your physician wants you to follow-up in: 6 months with Dr. Lovena Le.  You will receive a reminder letter in the mail two months in advance. If you don't receive a letter, please call our office to schedule the follow-up appointment.   Any Other Special Instructions Will Be Listed Below (If Applicable).     If you need a refill on your cardiac medications before your next appointment, please call your pharmacy.

## 2016-07-17 ENCOUNTER — Other Ambulatory Visit: Payer: Self-pay | Admitting: Obstetrics and Gynecology

## 2016-07-17 DIAGNOSIS — N390 Urinary tract infection, site not specified: Secondary | ICD-10-CM | POA: Diagnosis not present

## 2016-07-17 DIAGNOSIS — N760 Acute vaginitis: Secondary | ICD-10-CM | POA: Diagnosis not present

## 2016-07-18 ENCOUNTER — Encounter: Payer: Self-pay | Admitting: Family Medicine

## 2016-07-29 ENCOUNTER — Other Ambulatory Visit: Payer: Self-pay

## 2016-07-29 MED ORDER — LEVOTHYROXINE SODIUM 75 MCG PO TABS: 75.0000 ug | ORAL_TABLET | Freq: Every day | ORAL | 1 refills | Status: DC

## 2016-07-30 ENCOUNTER — Other Ambulatory Visit: Payer: Self-pay

## 2016-07-30 DIAGNOSIS — I82541 Chronic embolism and thrombosis of right tibial vein: Secondary | ICD-10-CM

## 2016-07-30 MED ORDER — RIVAROXABAN 20 MG PO TABS: 20.0000 mg | ORAL_TABLET | Freq: Every day | ORAL | 3 refills | Status: DC

## 2016-08-01 ENCOUNTER — Ambulatory Visit (INDEPENDENT_AMBULATORY_CARE_PROVIDER_SITE_OTHER): Payer: Medicare Other | Admitting: Family Medicine

## 2016-08-01 ENCOUNTER — Encounter: Payer: Self-pay | Admitting: Family Medicine

## 2016-08-01 VITALS — BP 136/76 | HR 64 | Temp 97.8°F | Ht 66.0 in | Wt 147.4 lb

## 2016-08-01 DIAGNOSIS — I82591 Chronic embolism and thrombosis of other specified deep vein of right lower extremity: Secondary | ICD-10-CM | POA: Diagnosis not present

## 2016-08-01 DIAGNOSIS — E785 Hyperlipidemia, unspecified: Secondary | ICD-10-CM

## 2016-08-01 DIAGNOSIS — I10 Essential (primary) hypertension: Secondary | ICD-10-CM | POA: Diagnosis not present

## 2016-08-01 NOTE — Progress Notes (Signed)
Subjective:  Laura Li is a 76 y.o. year old very pleasant female patient who presents for/with See problem oriented charting ROS- No chest pain or shortness of breath. No headache or blurry vision.    Past Medical History-  Patient Active Problem List   Diagnosis Date Noted  . Paroxysmal atrial fibrillation ([place]) 06/04/2016    Priority: High  . Chronic deep vein thrombosis (DVT) (HCC) 09/01/2015    Priority: High  . Pneumothorax 06/15/2014    Priority: High  . Breast cancer of lower-outer quadrant of left female breast ([place]) 04/24/2014    Priority: High  . Breast cancer, right breast ([place]) 04/24/2014    Priority: High  . Vasculitis ([place]) 11/09/2012    Priority: High  . Acute lower UTI 05/29/2016    Priority: Medium  . Osteopenia 05/23/2016    Priority: Medium  . Melanoma of skin ([place]) 03/31/2014    Priority: Medium  . Chronic kidney disease, stage III (moderate) 12/09/2012    Priority: Medium  . 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    Priority: Medium  . DVT, HX OF 12/30/2006    Priority: Medium  . Hypothyroidism 02/06/2006    Priority: Medium  . Hyperlipidemia 02/06/2006    Priority: Medium  . Essential hypertension 02/06/2006    Priority: Medium  . Localized swelling, mass, or lump of lower extremity 11/05/2014    Priority: Low  . Diverticulitis 08/09/2014    Priority: Low  . Family history of breast cancer 04/29/2014    Priority: Low  . GERD (gastroesophageal reflux disease) 03/31/2014    Priority: Low  . Hot flashes 03/31/2014    Priority: Low  . Osteoarthritis 03/31/2014    Priority: Low  . Allergic rhinitis 02/06/2006    Priority: Low    Medications- reviewed and updated Current Outpatient Prescriptions  Medication Sig Dispense Refill  . acetaminophen (TYLENOL) 500 MG tablet Take 500 mg by mouth 2 (two) times daily as needed for mild pain. Reported on 07/07/2015    . atorvastatin (LIPITOR) 10 MG tablet Take 1 tablet (10 mg total) by mouth daily.  90 tablet 3  . B Complex-C-E-Zn (B COMPLEX-C-E-ZINC) tablet Take 1 tablet by mouth daily.    . B Complex-C-Folic Acid (STRESS FORMULA PO) Take 1 tablet by mouth every morning.     . Calcium-Vitamin D (CALTRATE 600 PLUS-VIT D PO) Take 1 tablet by mouth 2 (two) times daily.     . carboxymethylcellulose (REFRESH PLUS) 0.5 % SOLN 2 drops 3 (three) times daily as needed.    . ciclopirox (PENLAC) 8 % solution APPLY ON THE AFFECTED NAILS BID UTD  5  . esomeprazole (NEXIUM) 40 MG capsule TAKE 1 BY MOUTH DAILY 90 capsule 2  . fexofenadine (ALLEGRA) 180 MG tablet Take 180 mg by mouth daily as needed for allergies.     [name] ipratropium (ATROVENT) 0.03 % nasal spray Place 2 sprays into the nose every 12 (twelve) hours. 180 mL 3  . Lactobacillus-Inulin (CULTURELLE DIGESTIVE HEALTH PO) Take 1 capsule by mouth daily.    [name] levothyroxine (SYNTHROID, LEVOTHROID) 75 MCG tablet Take 1 tablet (75 mcg total) by mouth daily. 90 tablet 1  . metroNIDAZOLE (FLAGYL) 500 MG tablet Take 2 tablets by mouth daily.  0  . nadolol (CORGARD) 20 MG tablet TAKE 1/2 TABLET(10 MG) BY MOUTH DAILY 45 tablet 2  . Propylene Glycol (SYSTANE BALANCE) 0.6 % SOLN Apply 1-2 drops to eye daily as needed (dry eyes).     . rivaroxaban (  XARELTO) 20 MG TABS tablet Take 1 tablet (20 mg total) by mouth daily with supper. 90 tablet 3  . venlafaxine XR (EFFEXOR-XR) 150 MG 24 hr capsule TAKE 1 CAPSULE(150 MG) BY MOUTH DAILY 90 capsule 2   No current facility-administered medications for this visit.     Objective: BP 136/76   Pulse 64   Temp 97.8 F (36.6 C) (Oral)   Ht 5\' 6"  (1.676 m)   Wt 147 lb 6.4 oz (66.9 kg)   SpO2 97%   BMI 23.79 kg/m  Gen: NAD, resting comfortably, appears stated age CV: RRR no murmurs rubs or gallops Lungs: CTAB no crackles, wheeze, rhonchi Ext: no edema Skin: warm, dry Neuro: grossly normal, moves all extremities  Assessment/Plan:  Hypertension S: controlled on nadolol 10mg .  ASCVD 10 year risk calculation if  age 52-79: yes elevated- on statin BP Readings from Last 3 Encounters:  08/01/16 136/76  07/15/16 118/70  07/02/16 (!) 141/73  A/P: We discussed blood pressure goal of <140/90. Continue current meds  Chronic deep vein thrombosis (DVT) (HCC) S: chronic DVT on xarelto. Also helpful for a fib history to prevent stroke risk A/P: continue current medications  Hyperlipidemia S: mild poorly controlled on atorvastatin 10mg - restarted 2 visits ago with follow up LDL at 119. No myalgias.  Lab Results  Component Value Date   CHOL 278 (H) 11/28/2015   HDL 36.60 (L) 11/28/2015   LDLCALC 124 (H) 06/21/2013   LDLDIRECT 119.0 04/30/2016   TRIG 270.0 (H) 11/28/2015   CHOLHDL 8 11/28/2015   A/P: we discussed at her age with no history of MI or CVA that data not as strong for primary prevention. She prefers not to take more medication so we both agreed to continue current dose   Return in about 4 months (around 12/02/2016) for follow up- or sooner if needed. Patient still frustrated by prior UTI experience. [facility] would [facility] so labs and cultures would be available on epic. I have recommended [facility]. She prefers to continue to have a GYN because she has had some yeast and BV issues. Also has some questions about urinary incontinence she would like to discuss with them.   Also need to set up AWV at next visit- most recent 10/06/15.   Meds ordered this encounter  Medications  . metroNIDAZOLE (FLAGYL) 500 MG tablet    Sig: Take 2 tablets by mouth daily.    Refill:  0    Return precautions advised.  [name], MD

## 2016-08-01 NOTE — Assessment & Plan Note (Signed)
S: chronic DVT on xarelto. Also helpful for a fib history to prevent stroke risk A/P: continue current medications

## 2016-08-01 NOTE — Assessment & Plan Note (Signed)
S: mild poorly controlled on atorvastatin 10mg - restarted 2 visits ago with follow up LDL at 119. No myalgias.  Lab Results  Component Value Date   CHOL 278 (H) 11/28/2015   HDL 36.60 (L) 11/28/2015   LDLCALC 124 (H) 06/21/2013   LDLDIRECT 119.0 04/30/2016   TRIG 270.0 (H) 11/28/2015   CHOLHDL 8 11/28/2015   A/P: we discussed at her age with no history of MI or CVA that data not as strong for primary prevention. She prefers not to take more medication so we both agreed to continue current dose

## 2016-08-01 NOTE — Patient Instructions (Signed)
No changes today

## 2016-08-01 NOTE — Progress Notes (Signed)
Medication Samples have been provided to the patient.  Drug name: Xarelto       Strength: 20mg         Qty: 14 tabs*   LOT: [id number] Exp.Date: 03-20  Dosing instructions: Take 1 tablet (20 mg total) by mouth daily with supper. - Oral  The patient has been instructed regarding the correct time, dose, and frequency of taking this medication, including desired effects and most common side effects.   Laura Li 11:27 AM 08/01/2016

## 2016-08-06 ENCOUNTER — Ambulatory Visit: Payer: Medicare Other | Admitting: Family Medicine

## 2016-08-19 ENCOUNTER — Ambulatory Visit (INDEPENDENT_AMBULATORY_CARE_PROVIDER_SITE_OTHER): Payer: Medicare Other | Admitting: Family Medicine

## 2016-08-19 ENCOUNTER — Encounter: Payer: Self-pay | Admitting: Family Medicine

## 2016-08-19 VITALS — BP 118/74 | HR 68 | Temp 97.5°F | Wt 147.4 lb

## 2016-08-19 DIAGNOSIS — N3090 Cystitis, unspecified without hematuria: Secondary | ICD-10-CM

## 2016-08-19 DIAGNOSIS — R309 Painful micturition, unspecified: Secondary | ICD-10-CM | POA: Diagnosis not present

## 2016-08-19 LAB — POC URINALSYSI DIPSTICK (AUTOMATED)
Bilirubin, UA: NEGATIVE
Glucose, UA: NEGATIVE
Ketones, UA: NEGATIVE
Nitrite, UA: NEGATIVE
PH UA: 6 (ref 5.0–8.0)
Protein, UA: NEGATIVE
SPEC GRAV UA: 1.015 (ref 1.010–1.025)
Urobilinogen, UA: 0.2 E.U./dL

## 2016-08-19 MED ORDER — CEFPODOXIME PROXETIL 100 MG PO TABS: 100.0000 mg | ORAL_TABLET | Freq: Two times a day (BID) | ORAL | 0 refills | Status: DC

## 2016-08-19 NOTE — Patient Instructions (Addendum)
Please take medication with food as directed. This is the antibiotic that you were provided during your last episode following hospitalization. If symptoms do not improve with treatment, worsen, or you develop a fever, nausea, vomiting, or back pain, please follow up for further evaluation and treatment. Also, please follow up with Dr. Yong Channel as scheduled for long term management.   Urinary Tract Infection, Adult A urinary tract infection (UTI) is an infection of any part of the urinary tract, which includes the kidneys, ureters, bladder, and urethra. These organs make, store, and get rid of urine in the body. UTI can be a bladder infection (cystitis) or kidney infection (pyelonephritis). What are the causes? This infection may be caused by fungi, viruses, or bacteria. Bacteria are the most common cause of UTIs. This condition can also be caused by repeated incomplete emptying of the bladder during urination. What increases the risk? This condition is more likely to develop if:  You ignore your need to urinate or hold urine for long periods of time.  You do not empty your bladder completely during urination.  You wipe back to front after urinating or having a bowel movement, if you are female.  You are uncircumcised, if you are female.  You are constipated.  You have a urinary catheter that stays in place (indwelling).  You have a weak defense (immune) system.  You have a medical condition that affects your bowels, kidneys, or bladder.  You have diabetes.  You take antibiotic medicines frequently or for long periods of time, and the antibiotics no longer work well against certain types of infections (antibiotic resistance).  You take medicines that irritate your urinary tract.  You are exposed to chemicals that irritate your urinary tract.  You are female.  What are the signs or symptoms? Symptoms of this condition include:  Fever.  Frequent urination or passing small amounts  of urine frequently.  Needing to urinate urgently.  Pain or burning with urination.  Urine that smells bad or unusual.  Cloudy urine.  Pain in the lower abdomen or back.  Trouble urinating.  Blood in the urine.  Vomiting or being less hungry than normal.  Diarrhea or abdominal pain.  Vaginal discharge, if you are female.  How is this diagnosed? This condition is diagnosed with a medical history and physical exam. You will also need to provide a urine sample to test your urine. Other tests may be done, including:  Blood tests.  Sexually transmitted disease (STD) testing.  If you have had more than one UTI, a cystoscopy or imaging studies may be done to determine the cause of the infections. How is this treated? Treatment for this condition often includes a combination of two or more of the following:  Antibiotic medicine.  Other medicines to treat less common causes of UTI.  Over-the-counter medicines to treat pain.  Drinking enough water to stay hydrated.  Follow these instructions at home:  Take over-the-counter and prescription medicines only as told by your health care provider.  If you were prescribed an antibiotic, take it as told by your health care provider. Do not stop taking the antibiotic even if you start to feel better.  Avoid alcohol, caffeine, tea, and carbonated beverages. They can irritate your bladder.  Drink enough fluid to keep your urine clear or pale yellow.  Keep all follow-up visits as told by your health care provider. This is important.  Make sure to: ? Empty your bladder often and completely. Do not hold urine  for long periods of time. ? Empty your bladder before and after sex. ? Wipe from front to back after a bowel movement if you are female. Use each tissue one time when you wipe. Contact a health care provider if:  You have back pain.  You have a fever.  You feel nauseous or vomit.  Your symptoms do not get better after 3  days.  Your symptoms go away and then return. Get help right away if:  You have severe back pain or lower abdominal pain.  You are vomiting and cannot keep down any medicines or water. This information is not intended to replace advice given to you by your health care provider. Make sure you discuss any questions you have with your health care provider. Document Released: 12/05/2004 Document Revised: 08/09/2015 Document Reviewed: 01/16/2015 Elsevier Interactive Patient Education  2017 [employer].

## 2016-08-19 NOTE — Progress Notes (Signed)
Patient ID: OSA FOGARTY, female   DOB: [birth year], 76 y.o.   MRN: [id number]    PCP: Marin Olp, MD  Subjective:  MARLAYA TURCK is a 76 y.o. year old very pleasant female patient who presents with Urinary Tract symptoms: symptoms including dysuria, frequency, and urgency. -started: 2 days, symptoms are not improving but are not worse either -previous treatments: acetaminophen which has provided limited benefit. She has seen gynecology 07/2016 who prescribed Flagyl for BV that provided moderate benefit and she denies any vaginal burning or discharge  No aggravating or alleviating factors noted.  ROS-denies fever, chills, sweats, N/V, flank pain, or blood in urine  Pertinent Past Medical History-  Acute cystitis with encephalopathy due to UTI where she was hospitalized in 05/2016.   Medications- reviewed  Current Outpatient Prescriptions  Medication Sig Dispense Refill  . acetaminophen (TYLENOL) 500 MG tablet Take 500 mg by mouth 2 (two) times daily as needed for mild pain. Reported on 07/07/2015    . atorvastatin (LIPITOR) 10 MG tablet Take 1 tablet (10 mg total) by mouth daily. 90 tablet 3  . B Complex-C-E-Zn (B COMPLEX-C-E-ZINC) tablet Take 1 tablet by mouth daily.    . B Complex-C-Folic Acid (STRESS FORMULA PO) Take 1 tablet by mouth every morning.     . Calcium-Vitamin D (CALTRATE 600 PLUS-VIT D PO) Take 1 tablet by mouth 2 (two) times daily.     . carboxymethylcellulose (REFRESH PLUS) 0.5 % SOLN 2 drops 3 (three) times daily as needed.    . ciclopirox (PENLAC) 8 % solution APPLY ON THE AFFECTED NAILS BID UTD  5  . esomeprazole (NEXIUM) 40 MG capsule TAKE 1 BY MOUTH DAILY 90 capsule 2  . fexofenadine (ALLEGRA) 180 MG tablet Take 180 mg by mouth daily as needed for allergies.     [name] ipratropium (ATROVENT) 0.03 % nasal spray Place 2 sprays into the nose every 12 (twelve) hours. 180 mL 3  . Lactobacillus-Inulin (CULTURELLE DIGESTIVE HEALTH PO) Take 1 capsule by mouth daily.    [name]  levothyroxine (SYNTHROID, LEVOTHROID) 75 MCG tablet Take 1 tablet (75 mcg total) by mouth daily. 90 tablet 1  . nadolol (CORGARD) 20 MG tablet TAKE 1/2 TABLET(10 MG) BY MOUTH DAILY 45 tablet 2  . Propylene Glycol (SYSTANE BALANCE) 0.6 % SOLN Apply 1-2 drops to eye daily as needed (dry eyes).     . rivaroxaban (XARELTO) 20 MG TABS tablet Take 1 tablet (20 mg total) by mouth daily with supper. 90 tablet 3  . venlafaxine XR (EFFEXOR-XR) 150 MG 24 hr capsule TAKE 1 CAPSULE(150 MG) BY MOUTH DAILY 90 capsule 2   No current facility-administered medications for this visit.     Objective: BP 118/74 (BP Location: Left Arm, Patient Position: Sitting, Cuff Size: Normal)   Pulse 68   Temp 97.5 F (36.4 C) (Oral)   Wt 147 lb 6.4 oz (66.9 kg)   SpO2 97%   BMI 23.79 kg/m  Gen: NAD, resting comfortably HEENT: Turbinates erythematous, TM normal, pharynx mildly erythematous with no tonsilar exudate or edema, no sinus tenderness CV: RRR no murmurs rubs or gallops Lungs: CTAB no crackles, wheeze, rhonchi Abdomen: soft/nontender/nondistended/normal bowel sounds. No rebound or guarding.  No CVA tenderness.   Suprapubic tenderness present Ext: no edema Skin: warm, dry, no rash Neuro: grossly normal, moves all extremities  Assessment/Plan: 1. Cystitis UA indicates 2+ Leukocytes, 3+ blood, and negative for nitrites.History and exam indicate cystitis.  Will send for culture. Empiric treatment with cefpodoxime  100 mg BID for 7 days as this provided benefit 05/2016. Advised patient to complete antibiotic and she should follow up if her symptoms do not improve in 2 to 3 days, worsen, she develops a fever >101, or back pain. Also advised her to force fluids and to follow up with Dr. Yong Channel and discuss with him  if she is experiencing these symptoms frequently as this is the 2nd episode in 3 months.  Patient voiced understanding and agreed with plan.  Finally, we reviewed reasons to return to care including if  symptoms worsen or persist or new concerns arise- once again particularly fever, N/V, or flank pain.  - cefpodoxime (VANTIN) 100 MG tablet; Take 1 tablet (100 mg total) by mouth 2 (two) times daily.  Dispense: 14 tablet; Refill: 0  2. Pain with urination  - POCT Urinalysis Dipstick (Automated) - Urine Culture   Laurita Quint, FNP

## 2016-08-21 LAB — URINE CULTURE

## 2016-08-26 ENCOUNTER — Telehealth: Payer: Self-pay | Admitting: Family Medicine

## 2016-08-26 NOTE — Telephone Encounter (Signed)
Pt saw Gregary Signs last Monday with UTI. Pt reporting small spotting as of yesterday. Pt tired, headache, and pain is very mild. Pt also wetting her pants continuously.  would like to know what to do.  [facility] [place], [place]

## 2016-08-26 NOTE — Telephone Encounter (Signed)
Please call pt on cell [phone]

## 2016-08-26 NOTE — Telephone Encounter (Signed)
Spoke with patient, scheduled her to see Gregary Signs on Thursday 08/29/2016 at 11am.

## 2016-08-26 NOTE — Telephone Encounter (Signed)
Called patient back and left a message to return my call in the office.

## 2016-08-29 ENCOUNTER — Ambulatory Visit (INDEPENDENT_AMBULATORY_CARE_PROVIDER_SITE_OTHER): Payer: Medicare Other | Admitting: Family Medicine

## 2016-08-29 ENCOUNTER — Encounter: Payer: Self-pay | Admitting: Family Medicine

## 2016-08-29 VITALS — BP 120/74 | HR 64 | Temp 98.1°F | Wt 144.8 lb

## 2016-08-29 DIAGNOSIS — R3915 Urgency of urination: Secondary | ICD-10-CM

## 2016-08-29 LAB — POC URINALSYSI DIPSTICK (AUTOMATED)
BILIRUBIN UA: NEGATIVE
Blood, UA: NEGATIVE
GLUCOSE UA: NEGATIVE
KETONES UA: NEGATIVE
Nitrite, UA: NEGATIVE
Protein, UA: NEGATIVE
SPEC GRAV UA: 1.02 (ref 1.010–1.025)
Urobilinogen, UA: 0.2 E.U./dL
pH, UA: 6 (ref 5.0–8.0)

## 2016-08-29 NOTE — Patient Instructions (Signed)
You will receive a call regarding the urine culture today. Please drink plenty of water, enough to keep your urine pale yellow or clear. Also, if you have any new symptoms such as fever, nausea, vomiting, or back pain, please follow up for further evaluation and treatment.  Also, please follow up with Dr. Yong Channel regarding long term management which may include being referred to a specialist if he decides this is needed.   Urinary Frequency, Adult Urinary frequency means urinating more often than usual. People with urinary frequency urinate at least 8 times in 24 hours, even if they drink a normal amount of fluid. Although they urinate more often than normal, the total amount of urine produced in a day may be normal. Urinary frequency is also called pollakiuria. What are the causes? This condition may be caused by:  A urinary tract infection.  Obesity.  Bladder problems, such as bladder stones.  Caffeine or alcohol.  Eating food or drinking fluids that irritate the bladder. These include coffee, tea, soda, artificial sweeteners, citrus, tomato-based foods, and chocolate.  Certain medicines, such as medicines that help the body get rid of extra fluid (diuretics).  Muscle or nerve weakness.  Overactive bladder.  Chronic diabetes.  Interstitial cystitis.  In men, problems with the prostate, such as an enlarged prostate.  In women, pregnancy.  In some cases, the cause may not be known. What increases the risk? This condition is more likely to develop in:  Women who have gone through menopause.  Men with prostate problems.  People with a disease or injury that affects the nerves or spinal cord.  People who have or have had a condition that affects the brain, such as a stroke.  What are the signs or symptoms? Symptoms of this condition include:  Feeling an urgent need to urinate often. The stress and anxiety of needing to find a bathroom quickly can make this urge  worse.  Urinating 8 or more times in 24 hours.  Urinating as often as every 1 to 2 hours.  How is this diagnosed? This condition is diagnosed based on your symptoms, your medical history, and a physical exam. You may have tests, such as:  Blood tests.  Urine tests.  Imaging tests, such as X-rays or ultrasounds.  A bladder test.  A test of your neurological system. This is the body system that senses the need to urinate.  A test to check for problems in the urethra and bladder called cystoscopy.  You may also be asked to keep a bladder diary. A bladder diary is a record of what you eat and drink, how often you urinate, and how much you urinate. You may need to see a health care provider who specializes in conditions of the urinary tract (urologist) or kidneys (nephrologist). How is this treated? Treatment for this condition depends on the cause. Sometimes the condition goes away on its own and treatment is not necessary. If treatment is needed, it may include:  Taking medicine.  Learning exercises that strengthen the muscles that help control urination.  Following a bladder training program. This may include: ? Learning to delay going to the bathroom. ? Double urinating (voiding). This helps if you are not completely emptying your bladder. ? Scheduled voiding.  Making diet changes, such as: ? Avoiding caffeine. ? Drinking fewer fluids, especially alcohol. ? Not drinking in the evening. ? Not having foods or drinks that may irritate the bladder. ? Eating foods that help prevent or ease constipation. Constipation  can make this condition worse.  Having the nerves in your bladder stimulated. There are two options for stimulating the nerves to your bladder: ? Outpatient electrical nerve stimulation. This is done by your health care provider. ? Surgery to implant a bladder pacemaker. The pacemaker helps to control the urge to urinate.  Follow these instructions at home:  Keep  a bladder diary if told to by your health care provider.  Take over-the-counter and prescription medicines only as told by your health care provider.  Do any exercises as told by your health care provider.  Follow a bladder training program as told by your health care provider.  Make any recommended diet changes.  Keep all follow-up visits as told by your health care provider. This is important. Contact a health care provider if:  You start urinating more often.  You feel pain or irritation when you urinate.  You notice blood in your urine.  Your urine looks cloudy.  You develop a fever.  You begin vomiting. Get help right away if:  You are unable to urinate. This information is not intended to replace advice given to you by your health care provider. Make sure you discuss any questions you have with your health care provider. Document Released: 12/22/2008 Document Revised: 03/29/2015 Document Reviewed: 09/21/2014 Elsevier Interactive Patient Education  [employer].

## 2016-08-29 NOTE — Progress Notes (Signed)
Patient ID: Laura Li, female   DOB: [birth year], 76 y.o.   MRN: [id number]    PCP: Marin Olp, MD  Subjective:  Laura Li is a 76 y.o. year old very pleasant female patient who presents with improving symptoms of dysuria, frequency, and urgency. Mild urgency remains. She was evaluated and treated on 08/19/16 for cystitis with cefpodoxime 100 mg BID x 7 days and she was advised to follow up with her PCP if symptoms did not improve, worsen, or she developed a fever >101. She was also advised to follow up with her PCP for long term management as her  prior history of UTI and encephalopathy led to hospitalization. She is here today as she is wanted to be checked to see if her urine results were okay. -started: 08/17/16 , symptoms are improving -previous treatments:cefpodoxime    Gynecology: 07/2016 prescribed Flagyl for BV that provided moderate benefit.  She denies vaginal discharge or burning today.  Urine culture was obtained after visit on 08/19/16 and noted e.coli >100,000. Culture indicated that 3rd generation cephalosporins were sensitive to bacteria.  ROS-denies fever, chills, sweats, N/V, flank pain,/discharge, burning or blood in urine  Pertinent Past Medical History- Acute cystitis with encephalopathy due to UTI where she was hospitalized 05/2016  Medications- reviewed  Current Outpatient Prescriptions  Medication Sig Dispense Refill  . acetaminophen (TYLENOL) 500 MG tablet Take 500 mg by mouth 2 (two) times daily as needed for mild pain. Reported on 07/07/2015    . atorvastatin (LIPITOR) 10 MG tablet Take 1 tablet (10 mg total) by mouth daily. 90 tablet 3  . B Complex-C-E-Zn (B COMPLEX-C-E-ZINC) tablet Take 1 tablet by mouth daily.    . B Complex-C-Folic Acid (STRESS FORMULA PO) Take 1 tablet by mouth every morning.     . Calcium-Vitamin D (CALTRATE 600 PLUS-VIT D PO) Take 1 tablet by mouth 2 (two) times daily.     . carboxymethylcellulose (REFRESH PLUS) 0.5 % SOLN 2 drops  3 (three) times daily as needed.    . ciclopirox (PENLAC) 8 % solution APPLY ON THE AFFECTED NAILS BID UTD  5  . esomeprazole (NEXIUM) 40 MG capsule TAKE 1 BY MOUTH DAILY 90 capsule 2  . fexofenadine (ALLEGRA) 180 MG tablet Take 180 mg by mouth daily as needed for allergies.     [name] ipratropium (ATROVENT) 0.03 % nasal spray Place 2 sprays into the nose every 12 (twelve) hours. 180 mL 3  . Lactobacillus-Inulin (CULTURELLE DIGESTIVE HEALTH PO) Take 1 capsule by mouth daily.    [name] levothyroxine (SYNTHROID, LEVOTHROID) 75 MCG tablet Take 1 tablet (75 mcg total) by mouth daily. 90 tablet 1  . nadolol (CORGARD) 20 MG tablet TAKE 1/2 TABLET(10 MG) BY MOUTH DAILY 45 tablet 2  . Propylene Glycol (SYSTANE BALANCE) 0.6 % SOLN Apply 1-2 drops to eye daily as needed (dry eyes).     . rivaroxaban (XARELTO) 20 MG TABS tablet Take 1 tablet (20 mg total) by mouth daily with supper. 90 tablet 3  . venlafaxine XR (EFFEXOR-XR) 150 MG 24 hr capsule TAKE 1 CAPSULE(150 MG) BY MOUTH DAILY 90 capsule 2   No current facility-administered medications for this visit.     Objective: BP 120/74 (BP Location: Left Arm, Patient Position: Sitting, Cuff Size: Normal)   Pulse 64   Temp 98.1 F (36.7 C) (Oral)   Wt 144 lb 12.8 oz (65.7 kg)   SpO2 97%   BMI 23.37 kg/m  Gen: NAD, resting comfortably HEENT: Oropharynx  clear and moist CV: RRR no murmurs rubs or gallops Lungs: CTAB no crackles, wheeze, rhonchi Abdomen: soft/nontender/nondistended/normal bowel sounds. No rebound or guarding.  No CVA tenderness.   Suprapubic tenderness not present Ext: no edema Skin: warm, dry, no rash Neuro: grossly normal, moves all extremities  Assessment/Plan: 1. Urgency of urination POC UA indicated 2+ Leukocytes; negative for nitrites and blood; she has completed cefpodoxime; will culture urine today as symptoms are improving. She has a history of incontinence and she stated she will discuss this with Dr. Yong Channel and may consider referral  to urology/gyncology if indicated.   Discussed results with Dr. Yong Channel who is in agreement with this plan. Advised patient to seek care and follow up if symptoms do not continue to improve or she develops fever, N/V, or flank pain.   - POCT Urinalysis Dipstick (Automated) - Urine Culture   Finally, we reviewed reasons to return to care including if symptoms worsen or persist or new concerns arise- once again particularly fever, N/V, or flank pain.   Laurita Quint, FNP

## 2016-08-30 ENCOUNTER — Telehealth: Payer: Self-pay

## 2016-08-30 LAB — URINE CULTURE

## 2016-08-30 MED ORDER — CEPHALEXIN 250 MG PO CAPS: 250.0000 mg | ORAL_CAPSULE | Freq: Four times a day (QID) | ORAL | 0 refills | Status: DC

## 2016-08-30 NOTE — Telephone Encounter (Signed)
Spoke with pt and advised. She will start abx today. Nothing further needed at this time.

## 2016-08-30 NOTE — Telephone Encounter (Signed)
Sent in keflex at lower dose but more frequent due to her slight decrease in kidney function. Culture should be back on Monday. If she is feeling worse despite this- should consider being seen in Saturday clinic or urgent care Saturday or Sunday

## 2016-08-30 NOTE — Telephone Encounter (Signed)
Patient called to state that she is feeling worse since yesterday. She is very fatigued and has chills. She would like to know if you could go ahead and send something in for her, she does understand culture results are not back yet. She states that she feels much worse than yesterday and wanted to see what you would recommend.  Dr. Yong Channel - Please advise. Thanks!

## 2016-09-05 ENCOUNTER — Telehealth: Payer: Self-pay | Admitting: Family Medicine

## 2016-09-05 IMAGING — CR DG CHEST 1V PORT
1 series · 2 of 2 positions shown · non-contrast
Comparison: Portable chest x-rays June 15, 2014

CLINICAL DATA: Reassess chest tubes for treatment of pneumothorax

EXAM:
PORTABLE CHEST - 1 VIEW

[Series 1: portable · 0.17mm/px · 2 of 2 slices shown]
[im 1/2]
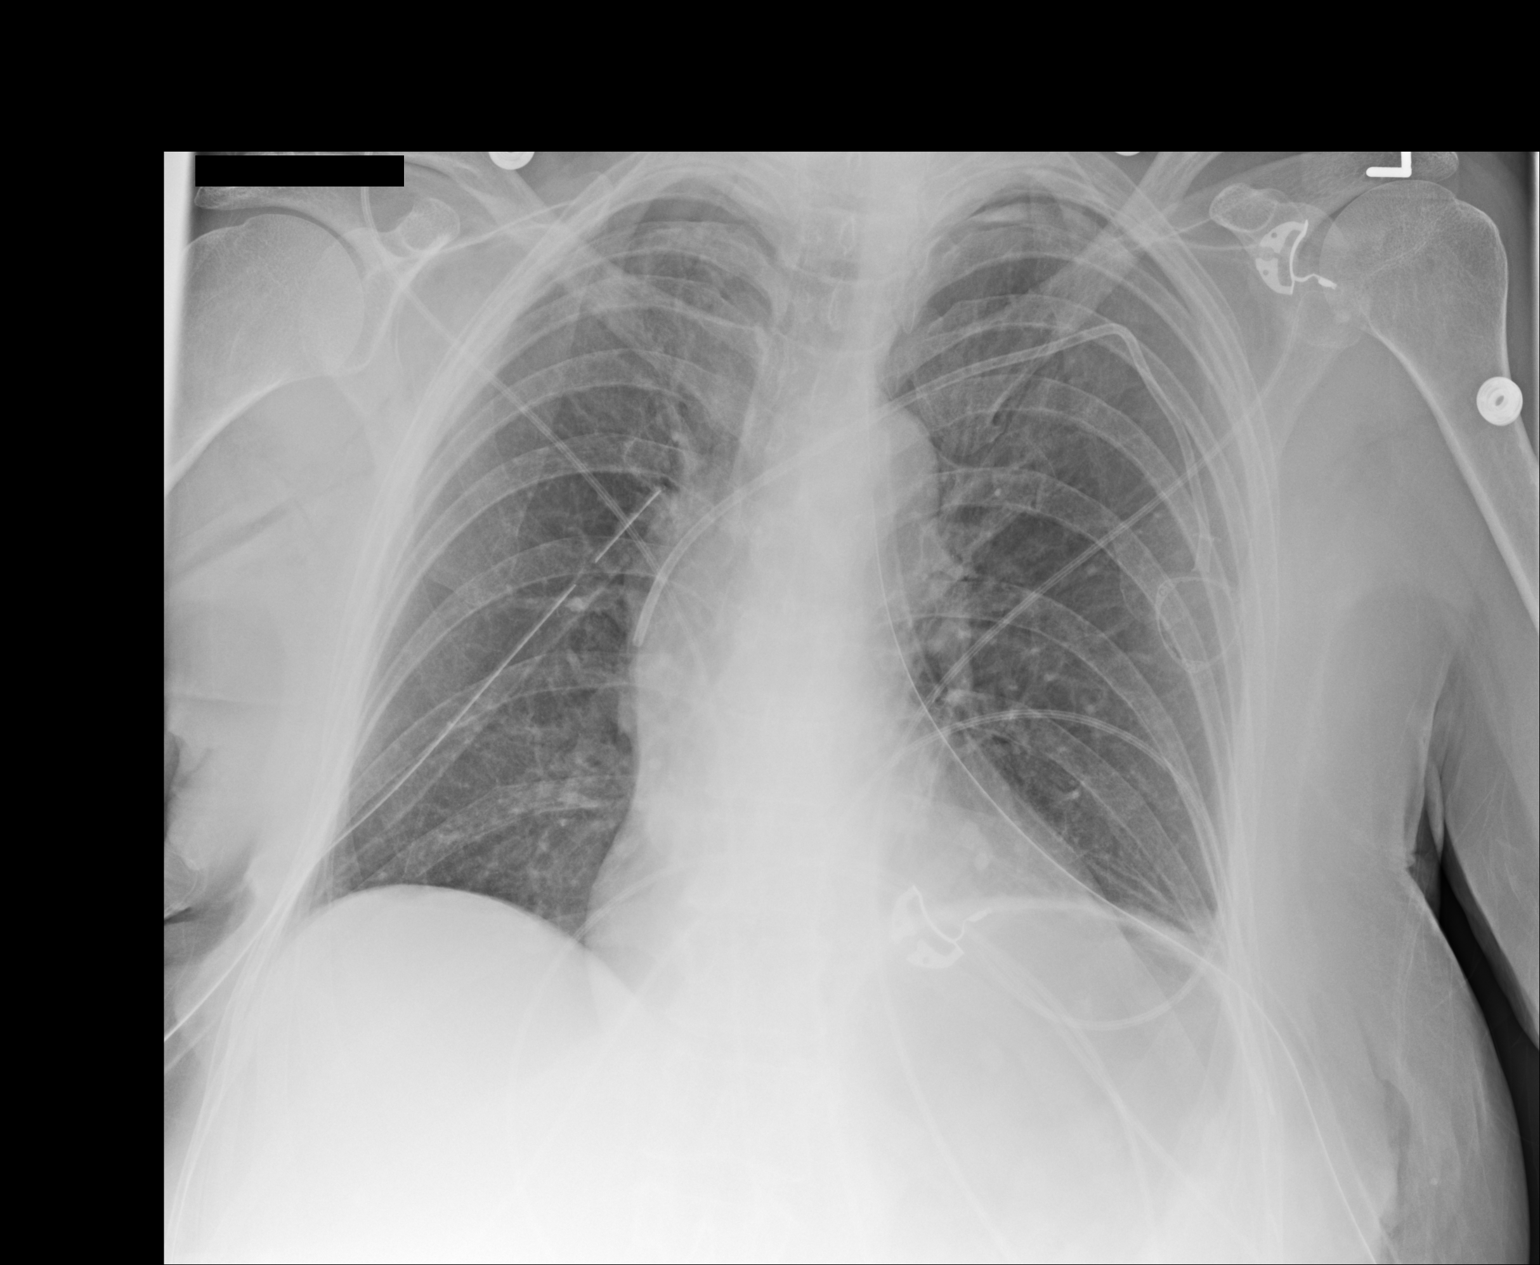
[im 2/2]
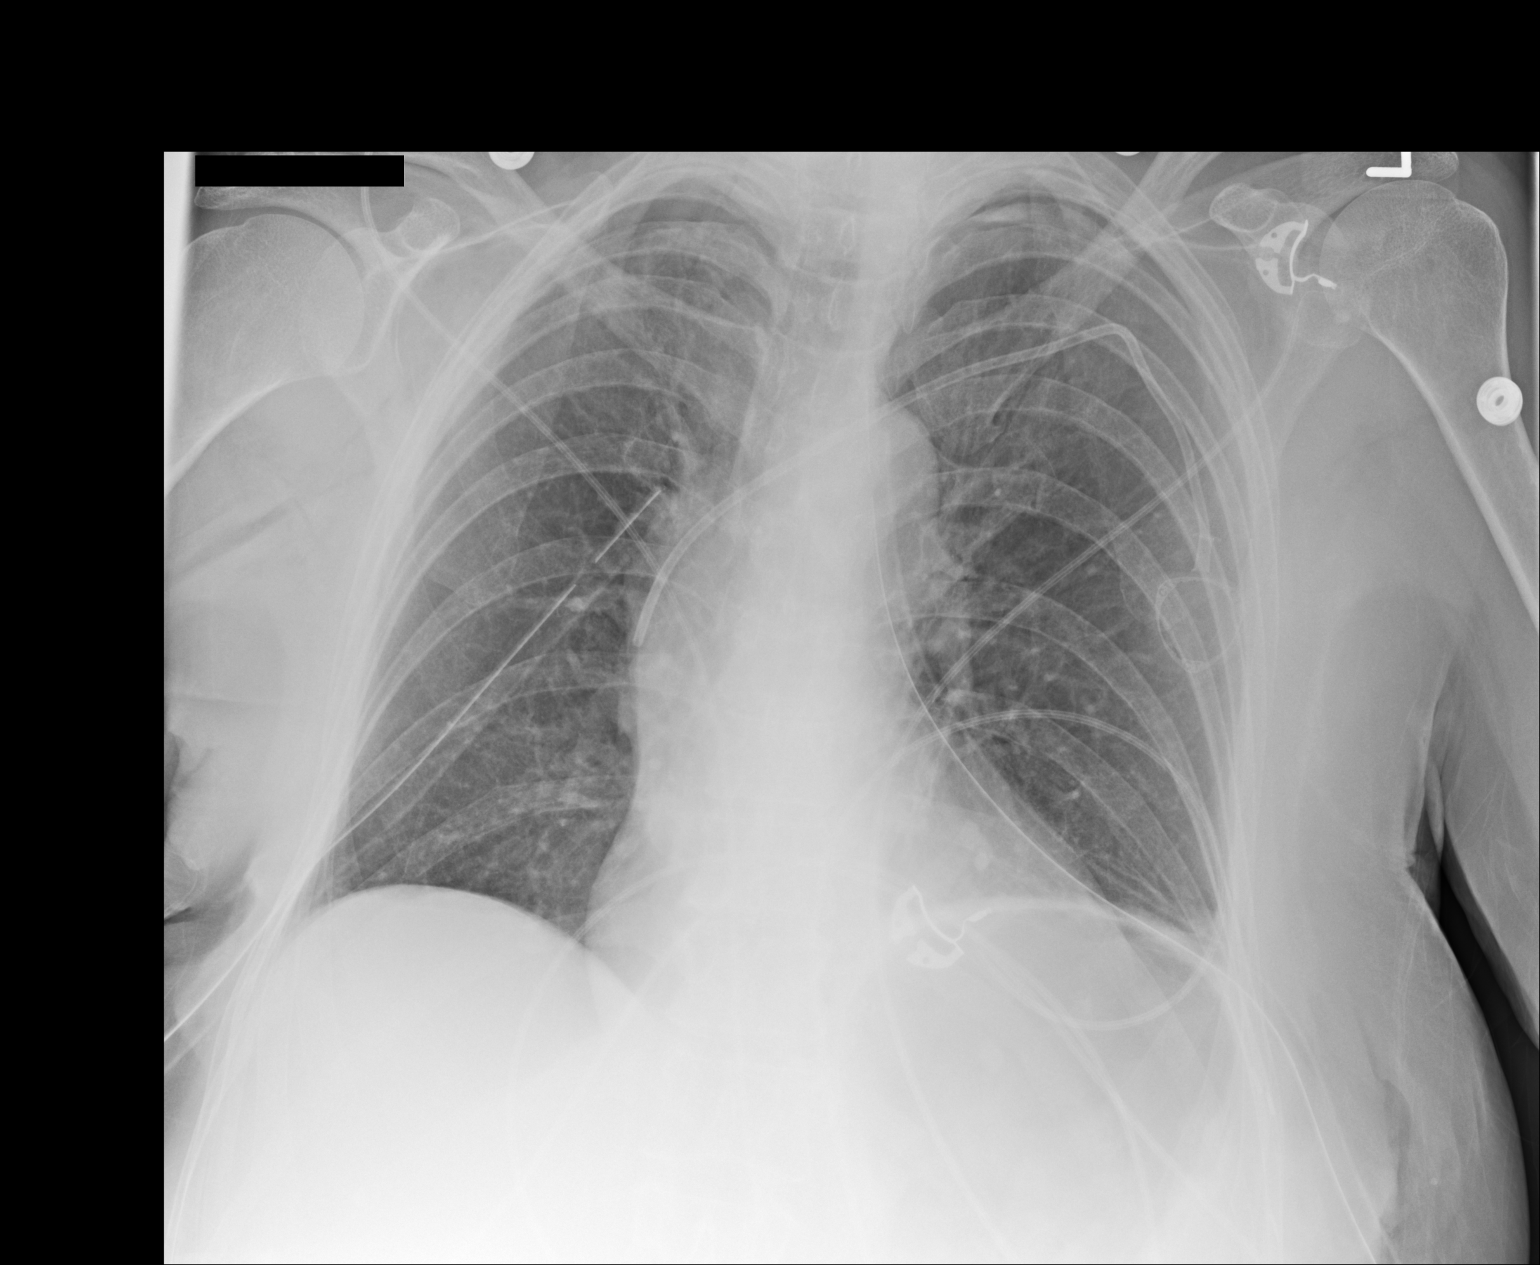

[2 of 2 positions shown; findings below may reference images not displayed]

FINDINGS: Bilateral chest tubes remain in place. The right lung is
well-expanded and clear. On the left a tiny stable apical
pneumothorax is demonstrated. There is no mediastinal shift. There
is no significant pleural effusion. The cardiac silhouette is
top-normal in size. The pulmonary vascularity is not engorged. The
bony thorax is unremarkable. The heart size and mediastinal contours
are within normal limits. Both lungs are clear. The visualized
skeletal structures are unremarkable.
IMPRESSION: Residual tiny left apical pneumothorax amounting to less than 5% of
the lung volume.

## 2016-09-05 NOTE — Telephone Encounter (Signed)
Pt is aware of her urine results

## 2016-09-05 NOTE — Telephone Encounter (Signed)
Pt saw julia on 6-21 and would like urine result

## 2016-09-07 IMAGING — CR DG CHEST 1V PORT
1 series · 1 of 1 positions shown · non-contrast
Comparison: 06/17/2014

CLINICAL DATA: Follow-up pneumothorax, Port-A-Cath placement

EXAM:
PORTABLE CHEST - 1 VIEW

[AP]
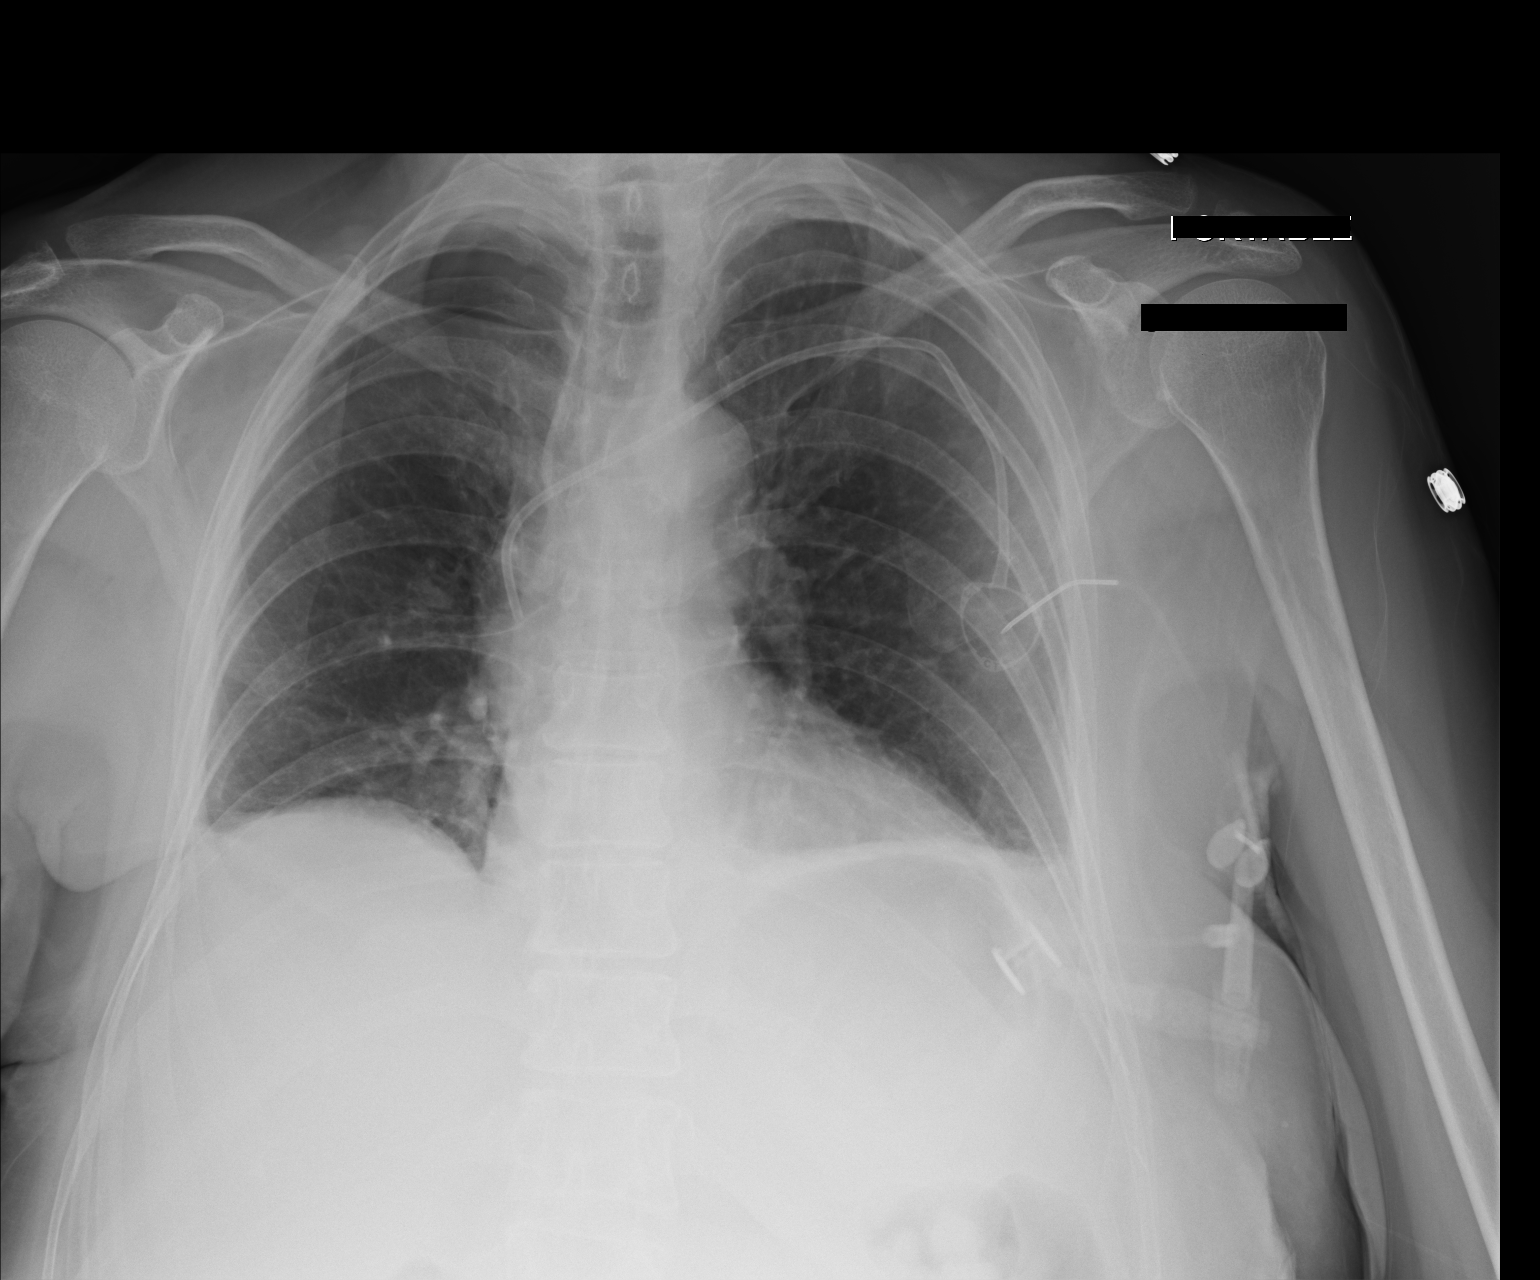

[1 of 1 positions shown; findings below may reference images not displayed]

FINDINGS: Cardiomediastinal silhouette is stable. Left subclavian Port-A-Cath
is unchanged in position. There is minimal increased small right
apical pneumothorax measures 2 cm in thickness. On the prior exam
measures 1.7 cm in thickness. No left pneumothorax. No acute
infiltrate or pulmonary edema.
IMPRESSION: Left subclavian Port-A-Cath is unchanged in position. There is
minimal increased small right apical pneumothorax measures 2 cm in
thickness. On the prior exam measures 1.7 cm in thickness. No left
pneumothorax. No acute infiltrate or pulmonary edema.

## 2016-09-09 IMAGING — CR DG CHEST 1V PORT
1 series · 2 of 2 positions shown · non-contrast
Comparison: Radiograph 06/19/2014

CLINICAL DATA: Pneumothorax, right chest tube

EXAM:
PORTABLE CHEST - 1 VIEW

[Series 1: ap portable · 0.17mm/px · 2 of 2 slices shown]
[im 1/2]
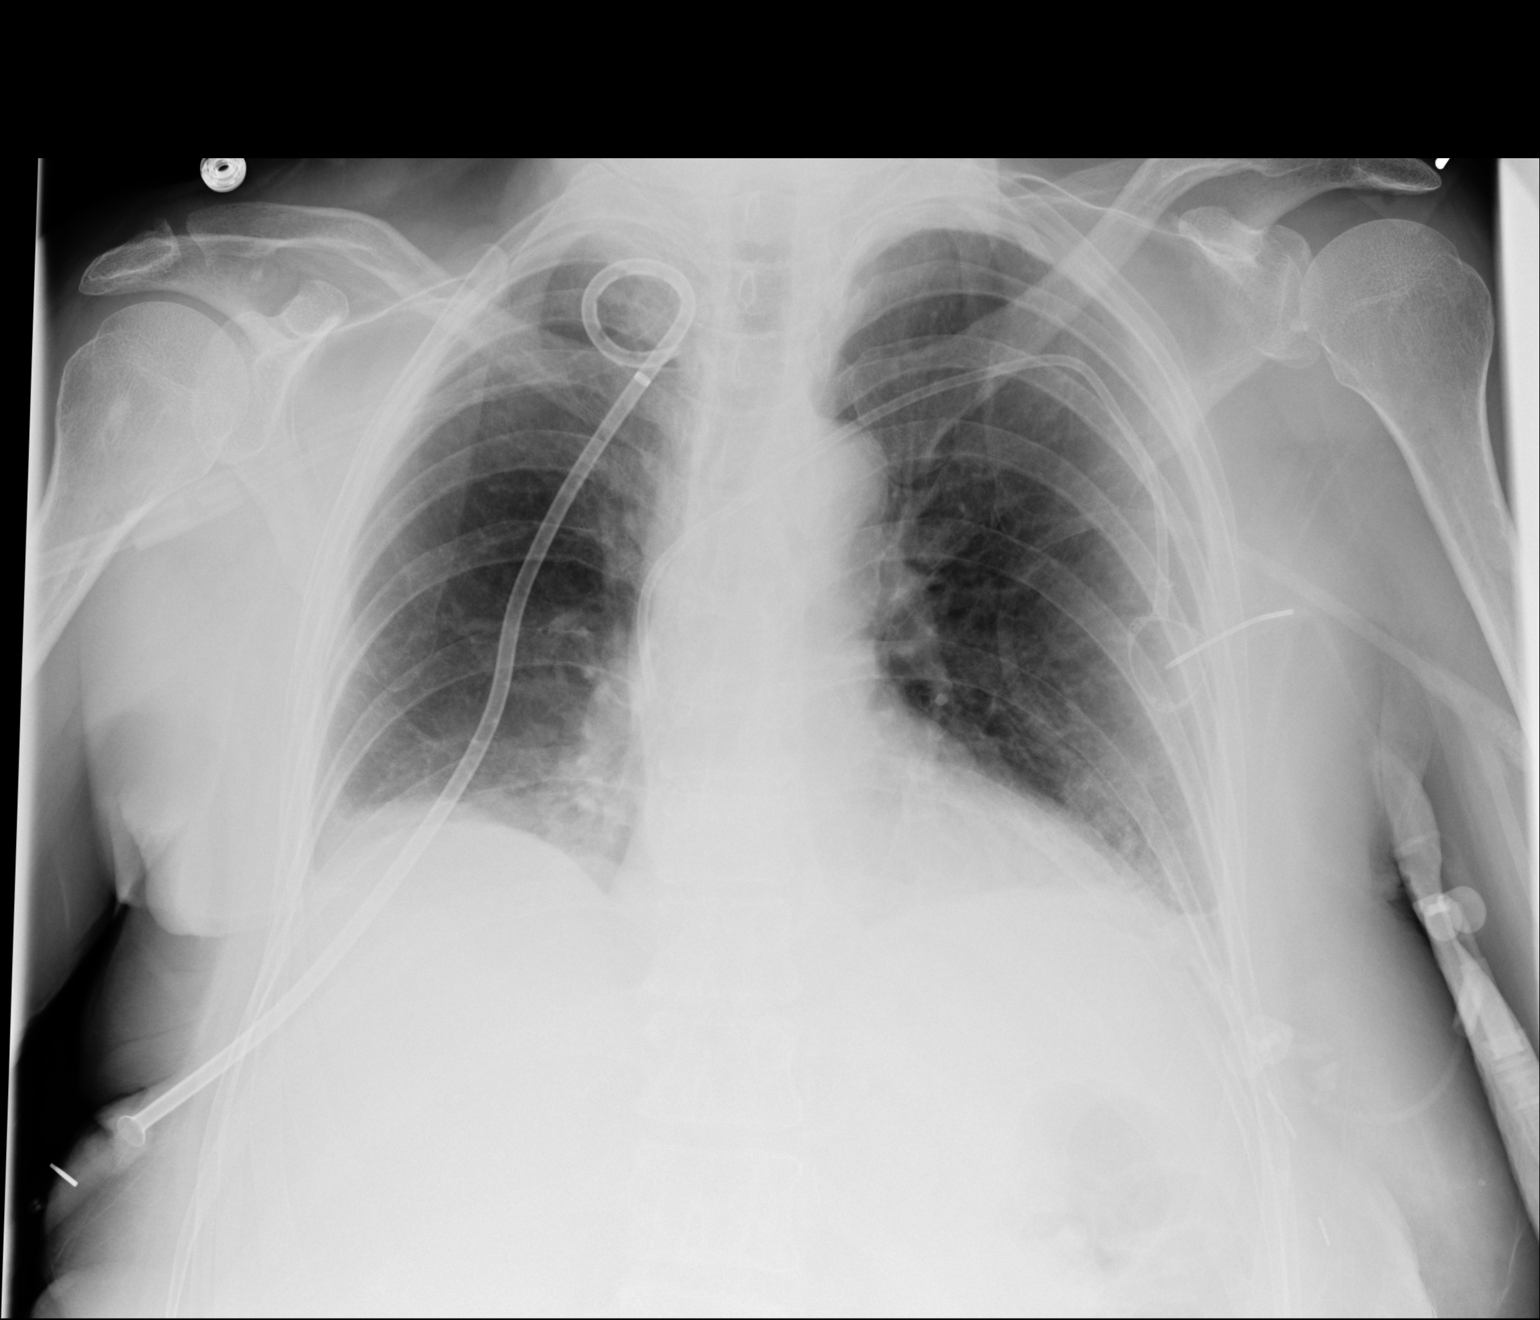
[im 2/2]
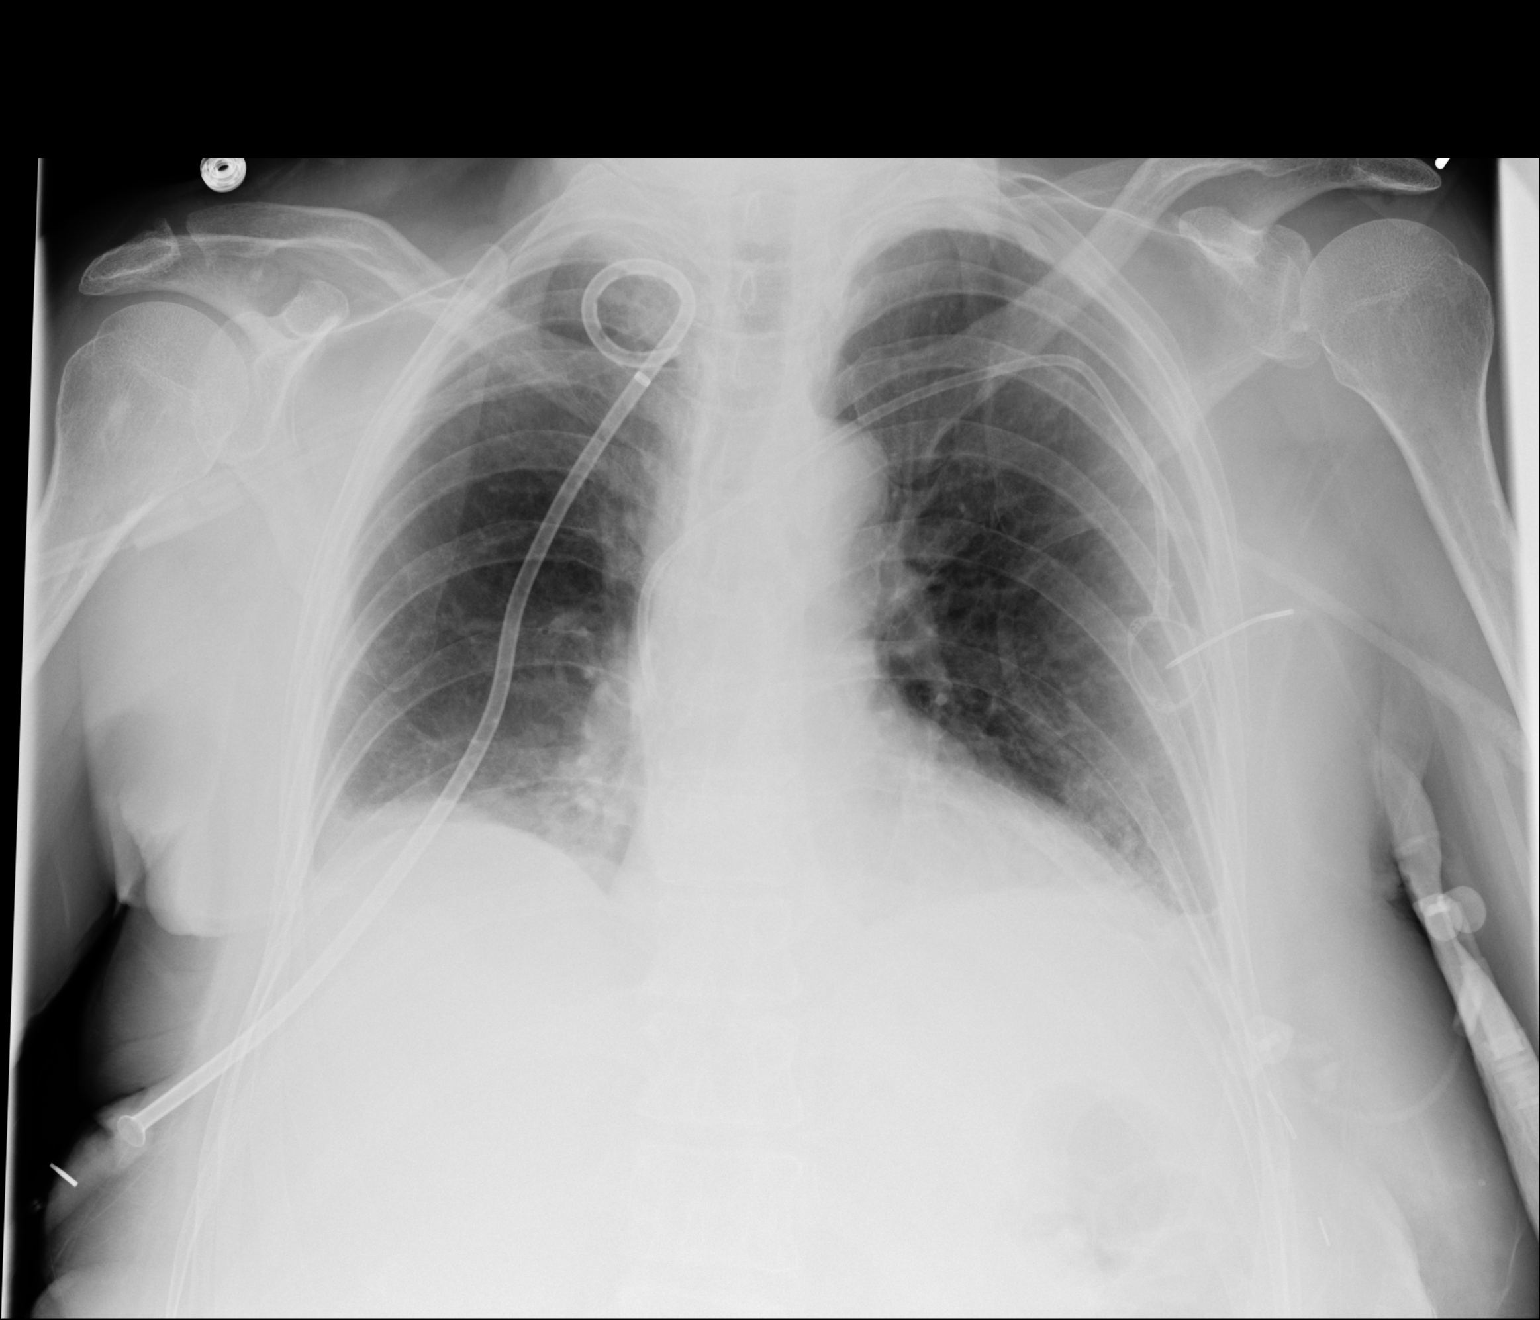

[2 of 2 positions shown; findings below may reference images not displayed]

FINDINGS: Left power port unchanged. Interval placement of a small bore right
chest tube at the apex. No appreciable pneumothorax. There is right
basilar atelectasis similar prior.
IMPRESSION: Right chest tube in place without evidence of pneumothorax.

## 2016-09-10 IMAGING — CR DG CHEST 1V PORT
1 series · 1 of 1 positions shown · non-contrast
Comparison: 06/20/2014 and 06/19/2014

CLINICAL DATA: Right pneumothorax.

EXAM:
PORTABLE CHEST - 1 VIEW

[AP]
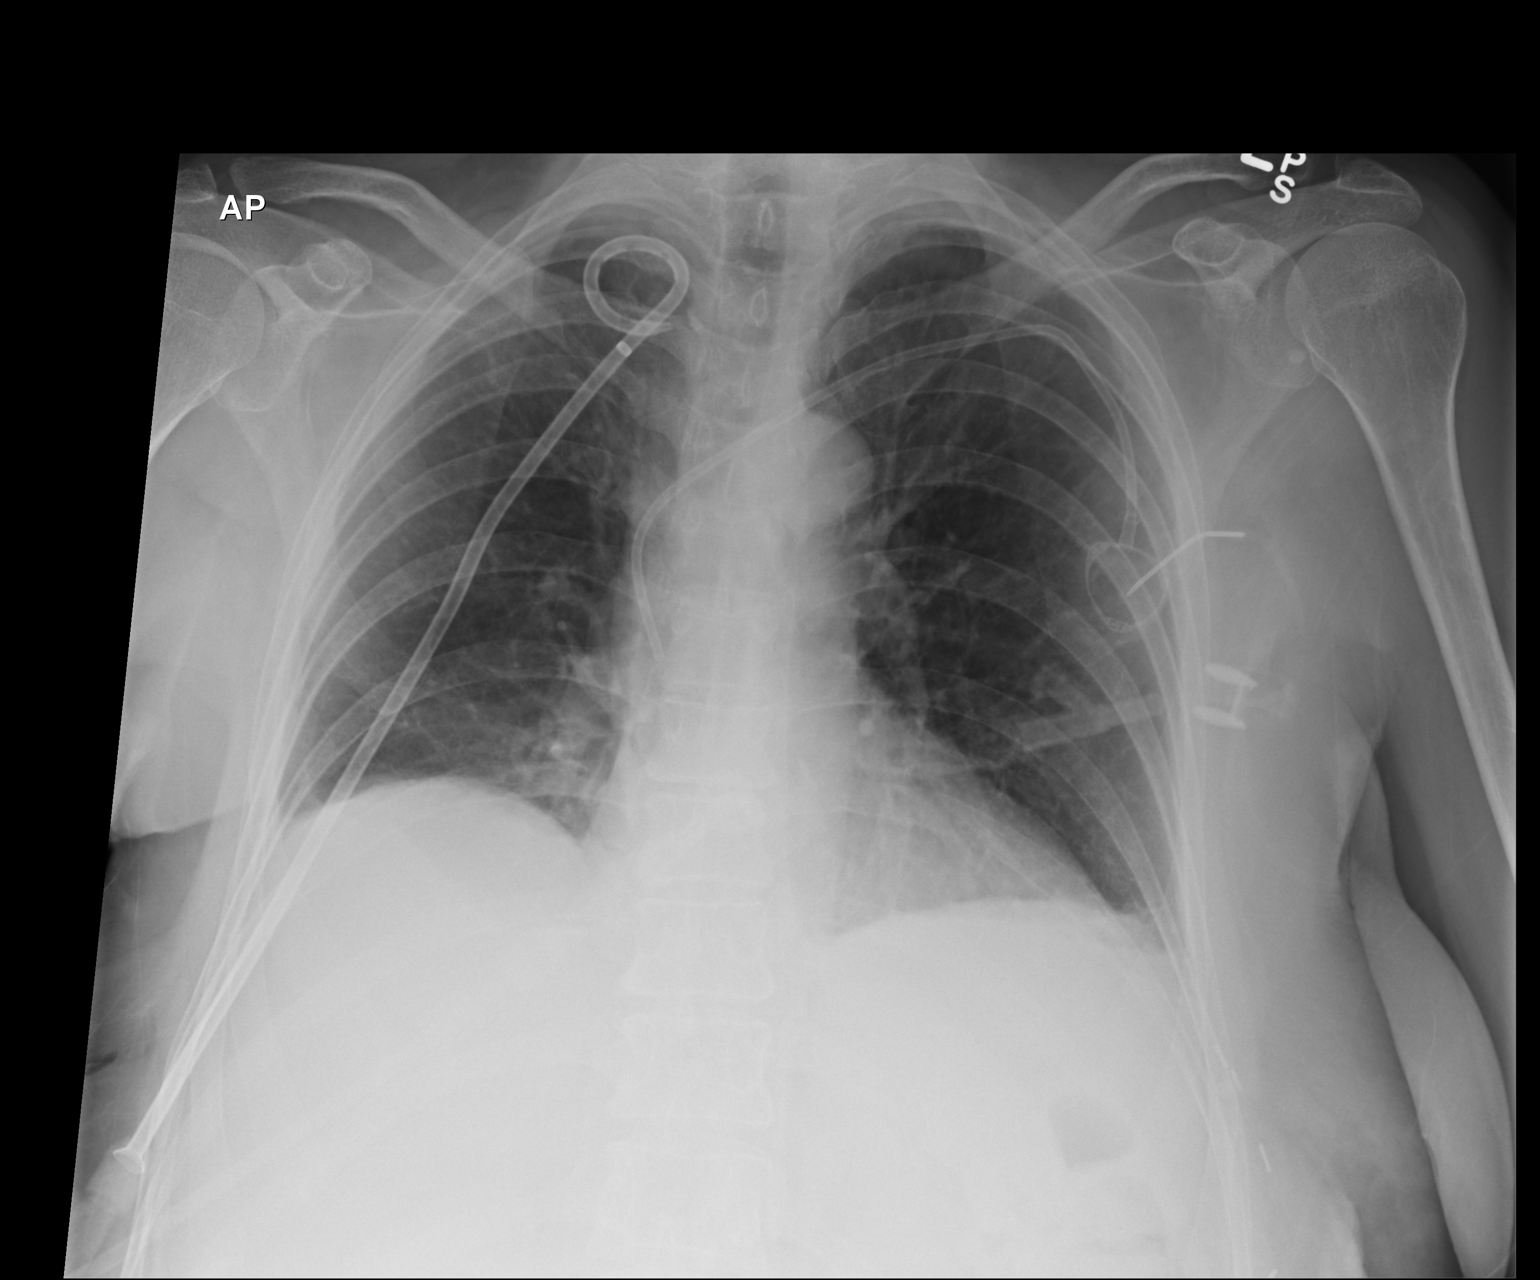

[1 of 1 positions shown; findings below may reference images not displayed]

FINDINGS: Power port appears in good position. Right-sided chest tube is in
good position. No visible residual pneumothorax on the right or
left.

Heart size and pulmonary vascularity are normal. Tiny bilateral
pleural effusions.
IMPRESSION: No pneumothorax.  Tiny bilateral pleural effusions.

## 2016-09-10 IMAGING — CR DG CHEST 1V PORT
1 series · 1 of 1 positions shown · non-contrast
Comparison: Chest radiograph earlier today.

CLINICAL DATA: Evaluate for pneumothorax. Recent RIGHT chest tube
removal.

EXAM:
PORTABLE CHEST - 1 VIEW

[AP]
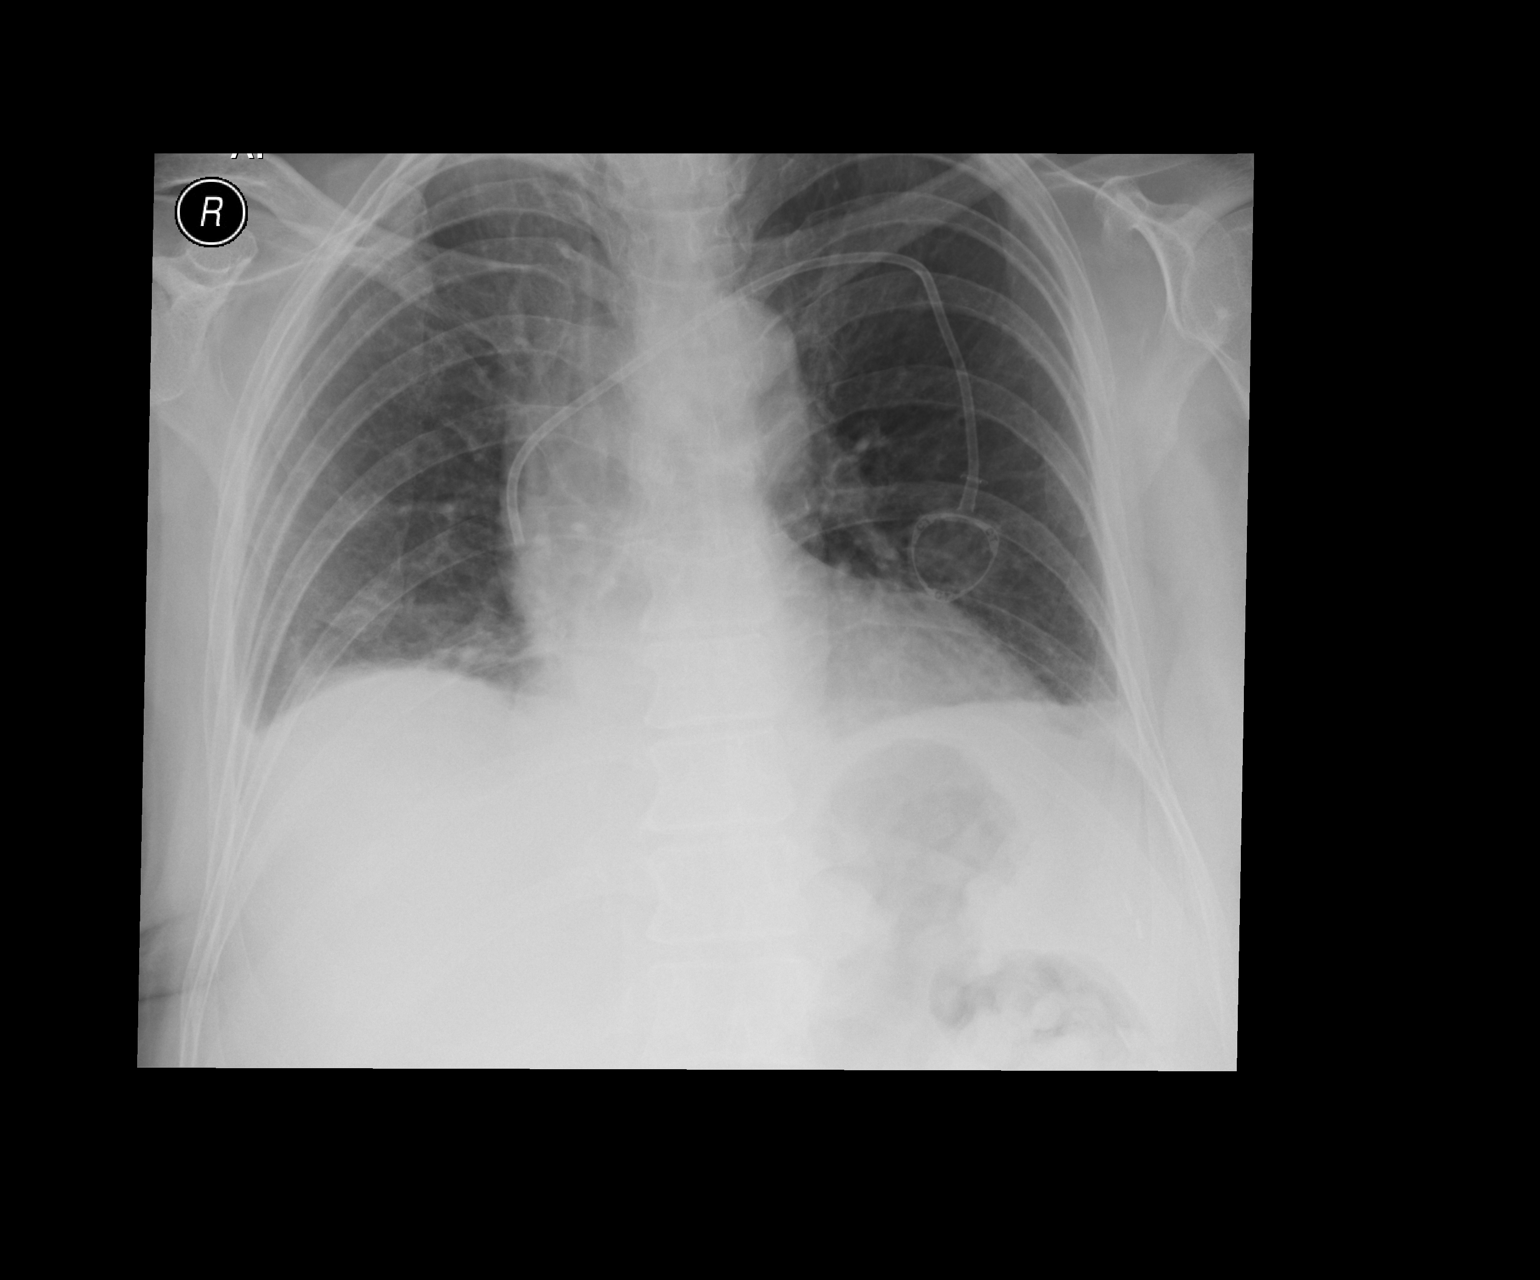

[1 of 1 positions shown; findings below may reference images not displayed]

FINDINGS: RIGHT chest tube has been removed. No pneumothorax. Stable LEFT
subclavian Port-A-Cath and stable BILATERAL pleural effusions. Low
lung volumes.
IMPRESSION: No pneumothorax is evident.

## 2016-09-19 ENCOUNTER — Ambulatory Visit (INDEPENDENT_AMBULATORY_CARE_PROVIDER_SITE_OTHER): Payer: Medicare Other | Admitting: Family Medicine

## 2016-09-19 ENCOUNTER — Encounter: Payer: Self-pay | Admitting: Family Medicine

## 2016-09-19 DIAGNOSIS — M170 Bilateral primary osteoarthritis of knee: Secondary | ICD-10-CM | POA: Diagnosis not present

## 2016-09-19 DIAGNOSIS — N3281 Overactive bladder: Secondary | ICD-10-CM | POA: Diagnosis not present

## 2016-09-19 DIAGNOSIS — R51 Headache: Secondary | ICD-10-CM | POA: Diagnosis not present

## 2016-09-19 DIAGNOSIS — R519 Headache, unspecified: Secondary | ICD-10-CM | POA: Insufficient documentation

## 2016-09-19 MED ORDER — DICLOFENAC SODIUM 1 % TD GEL: 2.0000 g | Freq: Four times a day (QID) | TRANSDERMAL | 5 refills | Status: DC

## 2016-09-19 NOTE — Patient Instructions (Signed)
Continue tylenol 1000mg  twice a day (two of the 500mg  tablets) around breakfast and dinner. Could also take a midday 500mg  dose.   Would trial heat for the neck. Could also consider massage.   Trial voltaren Gel up to 4x a day as long as doesn't cause rash.

## 2016-09-19 NOTE — Assessment & Plan Note (Addendum)
S: Patient has been having intermittent issues with her joints but seems to be worse than normal lately and seems to be main trigger for headaches- knees, low back, neck, hip. She tends to get headaches at times as noted in note about intermittent headaches. Better when she takes tylenol 1000mg  twice a day but sometimes pain despite this. Wonders if headaches come from neck and possiblecervical arthritis- wakes up with headaches at times. Prior eval by headache clinic as mentioned.  A/P: we discussed considering an extra 500mg  in the afternoon of tylenol. Also trial voltaren gel (on xarelto and also CKD III so want to avoid oral nsaids) for most bothersome joint- try in one area to start as has had allergic reactions to topicals including bengay. For neck- also suggested massage

## 2016-09-19 NOTE — Assessment & Plan Note (Addendum)
S: has had some headaches since last visit- has had some headaches for years- usually if she has another issue going on like diarrhea or UTI or joint pain. Has had multiple possible triggers including diarrhea, arthritis (worst of them). When in pain, thoughts have been less clear. MMSE 28/30 last year with follow up within a month AWV and memory testing. Feels more fatigued with headache and slightly off balance. Shuffles at times when doesn't feel well. Some trouble with directions. Tightness in top of her scalp.   Saw neurology 5 yrears ago - headache clinic Dr. Domingo Cocking. Would prefer different evaluator if needed.  A/P: Intermittent headaches for over 5 years- triggered by other things that bother her such as her joints or UTI or short term diarrhea. We will attempt to treat the issues that are trigger as no great headache treatment options- should avoid nsaids. Does not seem like migraines- triptans not helpful. Steroids could help short term possibly. Tylenol already on board

## 2016-09-19 NOTE — Assessment & Plan Note (Signed)
Has had some issues with incontinence intermittently for a year or two if bladder was too full. Incontinence now in late afternoon to evening- started dependz within a few months. Has urgency and cannot make it to the bathroom. Wearing dependz. May have some urge incontinence- prefer not to add to her regimen at present if possible.   Last UA without UTI after last treatment by Almira Coaster, NP. No worsening symptoms since that time so did not opt for repeat.

## 2016-09-19 NOTE — Progress Notes (Signed)
Subjective:  Laura Li is a 76 y.o. year old very pleasant female patient who presents for/with See problem oriented charting ROS- changed glasses and not fully comfortable with new strength. No extremity weakness. No edema. No chest pain. Has had headaches   Past Medical History-  Patient Active Problem List   Diagnosis Date Noted  . Paroxysmal atrial fibrillation ([place]) 06/04/2016    Priority: High  . Chronic deep vein thrombosis (DVT) ([facility]) 09/01/2015    Priority: High  . Pneumothorax 06/15/2014    Priority: High  . Breast cancer of lower-outer quadrant of left female breast ([place]) 04/24/2014    Priority: High  . Breast cancer, right breast ([place]) 04/24/2014    Priority: High  . Vasculitis ([place]) 11/09/2012    Priority: High  . Acute lower UTI 05/29/2016    Priority: Medium  . Osteopenia 05/23/2016    Priority: Medium  . Melanoma of skin ([place]) 03/31/2014    Priority: Medium  . Chronic kidney disease, stage III (moderate) 12/09/2012    Priority: Medium  . 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    Priority: Medium  . DVT, HX OF 12/30/2006    Priority: Medium  . Hypothyroidism 02/06/2006    Priority: Medium  . Hyperlipidemia 02/06/2006    Priority: Medium  . Essential hypertension 02/06/2006    Priority: Medium  . Localized swelling, mass, or lump of lower extremity 11/05/2014    Priority: Low  . Diverticulitis 08/09/2014    Priority: Low  . Family history of breast cancer 04/29/2014    Priority: Low  . GERD (gastroesophageal reflux disease) 03/31/2014    Priority: Low  . Hot flashes 03/31/2014    Priority: Low  . Osteoarthritis 03/31/2014    Priority: Low  . Allergic rhinitis 02/06/2006    Priority: Low  . Intermittent headache 09/19/2016  . Overactive bladder 09/19/2016    Medications- reviewed and updated Current Outpatient Prescriptions  Medication Sig Dispense Refill  . acetaminophen (TYLENOL) 500 MG tablet Take 500 mg by mouth 2 (two) times daily as  needed for mild pain. Reported on 07/07/2015    . atorvastatin (LIPITOR) 10 MG tablet Take 1 tablet (10 mg total) by mouth daily. 90 tablet 3  . B Complex-C-E-Zn (B COMPLEX-C-E-ZINC) tablet Take 1 tablet by mouth daily.    . B Complex-C-Folic Acid (STRESS FORMULA PO) Take 1 tablet by mouth every morning.     . Calcium-Vitamin D (CALTRATE 600 PLUS-VIT D PO) Take 1 tablet by mouth 2 (two) times daily.     . carboxymethylcellulose (REFRESH PLUS) 0.5 % SOLN 2 drops 3 (three) times daily as needed.    . cephALEXin (KEFLEX) 250 MG capsule Take 1 capsule (250 mg total) by mouth 4 (four) times daily. 28 capsule 0  . ciclopirox (PENLAC) 8 % solution APPLY ON THE AFFECTED NAILS BID UTD  5  . esomeprazole (NEXIUM) 40 MG capsule TAKE 1 BY MOUTH DAILY 90 capsule 2  . fexofenadine (ALLEGRA) 180 MG tablet Take 180 mg by mouth daily as needed for allergies.     [name] ipratropium (ATROVENT) 0.03 % nasal spray Place 2 sprays into the nose every 12 (twelve) hours. 180 mL 3  . Lactobacillus-Inulin (CULTURELLE DIGESTIVE HEALTH PO) Take 1 capsule by mouth daily.    [name] levothyroxine (SYNTHROID, LEVOTHROID) 75 MCG tablet Take 1 tablet (75 mcg total) by mouth daily. 90 tablet 1  . nadolol (CORGARD) 20 MG tablet TAKE 1/2 TABLET(10 MG) BY MOUTH DAILY 45 tablet 2  .  Propylene Glycol (SYSTANE BALANCE) 0.6 % SOLN Apply 1-2 drops to eye daily as needed (dry eyes).     . rivaroxaban (XARELTO) 20 MG TABS tablet Take 1 tablet (20 mg total) by mouth daily with supper. 90 tablet 3  . venlafaxine XR (EFFEXOR-XR) 150 MG 24 hr capsule TAKE 1 CAPSULE(150 MG) BY MOUTH DAILY 90 capsule 2  . diclofenac sodium (VOLTAREN) 1 % GEL Apply 2 g topically 4 (four) times daily. 100 g 5   No current facility-administered medications for this visit.     Objective: BP 128/76   Pulse 66   Temp 97.6 F (36.4 C) (Oral)   Ht 5\' 6"  (1.676 m)   Wt 146 lb 3.2 oz (66.3 kg)   SpO2 97%   BMI 23.60 kg/m  Gen: NAD, resting comfortably CV: RRR no murmurs  rubs or gallops Lungs: CTAB no crackles, wheeze, rhonchi Abdomen: soft/nontender/nondistended/normal bowel sounds.  Ext: no edema Skin: warm, dry Neuro: normal gait- does not appear to have shuffling gait  Assessment/Plan:  Osteoarthritis S: Patient has been having intermittent issues with her joints but seems to be worse than normal lately and seems to be main trigger for headaches- knees, low back, neck, hip. She tends to get headaches at times as noted in note about intermittent headaches. Better when she takes tylenol 1000mg  twice a day but sometimes pain despite this. Wonders if headaches come from neck and possiblecervical arthritis- wakes up with headaches at times. Prior eval by headache clinic as mentioned.  A/P: we discussed considering an extra 500mg  in the afternoon of tylenol. Also trial voltaren gel (on xarelto and also CKD III so want to avoid oral nsaids) for most bothersome joint- try in one area to start as has had allergic reactions to topicals including bengay. For neck- also suggested massage  Intermittent headache S: has had some headaches since last visit- has had some headaches for years- usually if she has another issue going on like diarrhea or UTI or joint pain. Has had multiple possible triggers including diarrhea, arthritis (worst of them). When in pain, thoughts have been less clear. MMSE 28/30 last year with follow up within a month AWV and memory testing. Feels more fatigued with headache and slightly off balance. Shuffles at times when doesn't feel well. Some trouble with directions. Tightness in top of her scalp.   Saw neurology 5 yrears ago - headache clinic Dr. Domingo Cocking. Would prefer different evaluator if needed.  A/P: Intermittent headaches for over 5 years- triggered by other things that bother her such as her joints or UTI or short term diarrhea. We will attempt to treat the issues that are trigger as no great headache treatment options- should avoid nsaids.  Does not seem like migraines- triptans not helpful. Steroids could help short term possibly. Tylenol already on board  Overactive bladder Has had some issues with incontinence intermittently for a year or two if bladder was too full. Incontinence now in late afternoon to evening- started dependz within a few months. Has urgency and cannot make it to the bathroom. Wearing dependz. May have some urge incontinence- prefer not to add to her regimen at present if possible.   Last UA without UTI after last treatment by Almira Coaster, NP. No worsening symptoms since that time so did not opt for repeat.    Meds ordered this encounter  Medications  . diclofenac sodium (VOLTAREN) 1 % GEL    Sig: Apply 2 g topically 4 (four) times daily.  Dispense:  100 g    Refill:  5   The duration of face-to-face time during this visit was greater than 25 minutes. Greater than 50% of this time was spent in counseling, explanation of diagnosis, planning of further management, and/or coordination of care including counseling on options for treatment of various aches/pains, discussing dealing with her bladder issues, providing comfort about continued headache pattern.   Return precautions advised.  [name], MD

## 2016-10-08 ENCOUNTER — Ambulatory Visit (INDEPENDENT_AMBULATORY_CARE_PROVIDER_SITE_OTHER): Payer: Medicare Other | Admitting: Adult Health

## 2016-10-08 ENCOUNTER — Encounter: Payer: Self-pay | Admitting: Adult Health

## 2016-10-08 VITALS — BP 150/74 | Temp 98.3°F | Ht 66.0 in | Wt 145.0 lb

## 2016-10-08 DIAGNOSIS — J0110 Acute frontal sinusitis, unspecified: Secondary | ICD-10-CM

## 2016-10-08 MED ORDER — DOXYCYCLINE HYCLATE 100 MG PO CAPS: 100.0000 mg | ORAL_CAPSULE | Freq: Two times a day (BID) | ORAL | 0 refills | Status: DC

## 2016-10-08 NOTE — Progress Notes (Signed)
Subjective:    Patient ID: Laura Li, female    DOB: Jul 15, 1940, 76 y.o.   MRN: [id number]  Sinusitis  This is a new problem. The current episode started 1 to 4 weeks ago (2 weeks ago ). There has been no fever. Associated symptoms include congestion, ear pain, headaches and sinus pressure. Pertinent negatives include no chills, coughing, shortness of breath or swollen glands. Past treatments include nothing. The treatment provided no relief.      Review of Systems  Constitutional: Positive for fatigue. Negative for activity change, chills and fever.  HENT: Positive for congestion, ear pain, sinus pain and sinus pressure. Negative for ear discharge, postnasal drip and rhinorrhea.   Eyes: Negative.   Respiratory: Negative for cough and shortness of breath.   Cardiovascular: Negative.   Gastrointestinal: Negative.   Genitourinary: Negative.   Neurological: Positive for headaches.   Past Medical History:  Diagnosis Date  . Anemia    from medicaions  . Arthritis   . Chronic kidney disease    some renal impairment from meds  . Depression    states no depression  . Eczema   . Esophageal stricture   . GERD (gastroesophageal reflux disease)   . Headache   . Heart murmur    states it is benign told 30-40 years ago  . History of DVT (deep vein thrombosis)   . HX: breast cancer    melanoma  . Hyperlipidemia   . Hypertension   . Hypothyroidism   . Melanoma ([place])   . OA (osteoarthritis)   . Pneumothorax on right 06/15/2014  . PONV (postoperative nausea and vomiting)   . Wears glasses     Social History   Social History  . Marital status: Widowed    Spouse name: N/A  . Number of children: N/A  . Years of education: N/A   Occupational History  . retired    Social History Main Topics  . Smoking status: Former Smoker    Packs/day: 0.75    Years: 3.00    Types: Cigarettes    Quit date: 03/11/1969  . Smokeless tobacco: Never Used  . Alcohol use 0.0 oz/week   Comment: occasional wine  . Drug use: No  . Sexual activity: No   Other Topics Concern  . Not on file   Social History Narrative   Widowed 2003. Moved to [place] from [place] in 2004 after loss of husband to be near daughter. 1 daughter. 2 grandkids.       Retired from working at a church      Hobbies: Training and development officer    Past Surgical History:  Procedure Laterality Date  . APPENDECTOMY  1967  . BREAST LUMPECTOMY     left breast-benign  . BREAST SURGERY  1977   removal of calcified milk gland right breast  . CHEST TUBE INSERTION  06/15/2014  . CHEST TUBE INSERTION Bilateral 06/15/2014   Procedure: CHEST TUBE INSERTION;  Surgeon: Rolm Bookbinder, MD;  Location: [facility];  Service: General;  Laterality: Bilateral;  . COLONOSCOPY    . LIPOMA EXCISION  1978   right breast  . MASTECTOMY  1998   right-nodes out  . MELANOMA EXCISION  2004   right side of face  . MOHS SURGERY  2005   right left-basal cell  . PORT-A-CATH REMOVAL N/A 10/05/2014   Procedure: REMOVAL PORT-A-CATH;  Surgeon: Rolm Bookbinder, MD;  Location: [facility];  Service: General;  Laterality: N/A;  . PORTACATH PLACEMENT Left 06/15/2014  Procedure: INSERTION PORT-A-CATH;  Surgeon: Rolm Bookbinder, MD;  Location: [facility];  Service: General;  Laterality: Left;  . RADIOACTIVE SEED GUIDED MASTECTOMY WITH AXILLARY SENTINEL LYMPH NODE BIOPSY Left 05/05/2014   Procedure: RADIOACTIVE SEED GUIDED LEFT LUMPECTOMY WITH AXILLARY SENTINEL LYMPH NODE BIOPSY;  Surgeon: Rolm Bookbinder, MD;  Location: [facility];  Service: General;  Laterality: Left;  . TONSILLECTOMY AND ADENOIDECTOMY  1949  . TOTAL ABDOMINAL HYSTERECTOMY  1992   including ovaries    Family History  Problem Relation Age of Onset  . Heart disease Father 98       smoker  . Heart attack Father   . Endometrial cancer Mother 70  . Breast cancer Cousin        x 3 cousins with breast cancer (one at 33, one at 84 and one at 30)  . Lung cancer  Paternal Aunt        smoker  . Brain cancer Maternal Aunt   . Diabetes Maternal Aunt        ???  . Breast cancer Maternal Aunt 51  . Healthy Sister   . Colon cancer Neg Hx     Allergies  Allergen Reactions  . Dyazide [Hydrochlorothiazide W-Triamterene]     Presumption: drug induced vasculitis  . Niacin Other (See Comments)    flushing  . Penicillins Diarrhea and Nausea And Vomiting    Current Outpatient Prescriptions on File Prior to Visit  Medication Sig Dispense Refill  . acetaminophen (TYLENOL) 500 MG tablet Take 500 mg by mouth 2 (two) times daily as needed for mild pain. Reported on 07/07/2015    . atorvastatin (LIPITOR) 10 MG tablet Take 1 tablet (10 mg total) by mouth daily. 90 tablet 3  . B Complex-C-E-Zn (B COMPLEX-C-E-ZINC) tablet Take 1 tablet by mouth daily.    . B Complex-C-Folic Acid (STRESS FORMULA PO) Take 1 tablet by mouth every morning.     . Calcium-Vitamin D (CALTRATE 600 PLUS-VIT D PO) Take 1 tablet by mouth 2 (two) times daily.     . carboxymethylcellulose (REFRESH PLUS) 0.5 % SOLN 2 drops 3 (three) times daily as needed.    . ciclopirox (PENLAC) 8 % solution APPLY ON THE AFFECTED NAILS BID UTD  5  . diclofenac sodium (VOLTAREN) 1 % GEL Apply 2 g topically 4 (four) times daily. 100 g 5  . esomeprazole (NEXIUM) 40 MG capsule TAKE 1 BY MOUTH DAILY 90 capsule 2  . fexofenadine (ALLEGRA) 180 MG tablet Take 180 mg by mouth daily as needed for allergies.     [name] ipratropium (ATROVENT) 0.03 % nasal spray Place 2 sprays into the nose every 12 (twelve) hours. 180 mL 3  . Lactobacillus-Inulin (CULTURELLE DIGESTIVE HEALTH PO) Take 1 capsule by mouth daily.    [name] levothyroxine (SYNTHROID, LEVOTHROID) 75 MCG tablet Take 1 tablet (75 mcg total) by mouth daily. 90 tablet 1  . nadolol (CORGARD) 20 MG tablet TAKE 1/2 TABLET(10 MG) BY MOUTH DAILY 45 tablet 2  . Propylene Glycol (SYSTANE BALANCE) 0.6 % SOLN Apply 1-2 drops to eye daily as needed (dry eyes).     . rivaroxaban  (XARELTO) 20 MG TABS tablet Take 1 tablet (20 mg total) by mouth daily with supper. 90 tablet 3  . venlafaxine XR (EFFEXOR-XR) 150 MG 24 hr capsule TAKE 1 CAPSULE(150 MG) BY MOUTH DAILY 90 capsule 2   No current facility-administered medications on file prior to visit.     BP (!) 150/74 (BP Location: Left Arm)  Temp 98.3 F (36.8 C) (Oral)   Ht 5\' 6"  (1.676 m)   Wt 145 lb (65.8 kg)   BMI 23.40 kg/m       Objective:   Physical Exam  Constitutional: She is oriented to person, place, and time. She appears well-developed and well-nourished. No distress.  HENT:  Right Ear: Hearing, tympanic membrane, external ear and ear canal normal.  Left Ear: Hearing, tympanic membrane, external ear and ear canal normal.  Nose: Mucosal edema and rhinorrhea present. Right sinus exhibits frontal sinus tenderness.  Mouth/Throat: Uvula is midline, oropharynx is clear and moist and mucous membranes are normal.  Eyes: Pupils are equal, round, and reactive to light. Conjunctivae and EOM are normal. Right eye exhibits no discharge. Left eye exhibits no discharge. No scleral icterus.  Cardiovascular: Normal rate, regular rhythm, normal heart sounds and intact distal pulses.  Exam reveals no gallop and no friction rub.   No murmur heard. Pulmonary/Chest: Effort normal and breath sounds normal. No respiratory distress. She has no wheezes. She has no rales. She exhibits no tenderness.  Neurological: She is alert and oriented to person, place, and time.  Skin: Skin is warm and dry. No rash noted. She is not diaphoretic. No erythema. No pallor.  Psychiatric: She has a normal mood and affect. Her behavior is normal. Judgment and thought content normal.  Nursing note and vitals reviewed.     Assessment & Plan:  1. Acute frontal sinusitis, recurrence not specified - Exam consistent with sinusitis. Will treat with doxycycline. I also recommended Flonase but she did not want to take it due to using it in the past and  it made her nostrils dry.  - Stay hydrated - doxycycline (VIBRAMYCIN) 100 MG capsule; Take 1 capsule (100 mg total) by mouth 2 (two) times daily.  Dispense: 14 capsule; Refill: 0 - Follow up if no improvement in the next 2-3 days   Dorothyann Peng, NP

## 2016-10-15 ENCOUNTER — Telehealth: Payer: Self-pay

## 2016-10-15 NOTE — Telephone Encounter (Signed)
The following message was received from Laura Li's daughter Laura Li:  Hi Dr. Yong Channel!    I am actually using my own message portal to write you about my mother, Laura Li. Mainly, I wanted to give some information prior to her visit with you on Wednesday. She is still persistently complaining of not feeling well or feeling like something "isn't right." Her chief complaints are of headache, sometimes severe, and very low energy. She is not 'typically' like this, so I feel like it warrants a mention. She seems more easily flustered and I can tell she just isn't herself. I ask her what would make her feel better, and she seems to be looking for a magic bullet. I worry she is getting depressed as well. Given her history with cancer and other ailments, I don't want to discount her. It's like a vicious cycle, and it's hard to know what the culprit is. I will be with her on Wednesday, but I wanted to give you a heads up. This visit is a follow up to a nurse visit where she was diagnosed with a sinus infection and put on antibiotics - again ; ) Thanks!

## 2016-10-16 ENCOUNTER — Ambulatory Visit (INDEPENDENT_AMBULATORY_CARE_PROVIDER_SITE_OTHER): Payer: Medicare Other | Admitting: Family Medicine

## 2016-10-16 ENCOUNTER — Encounter: Payer: Self-pay | Admitting: Family Medicine

## 2016-10-16 VITALS — BP 138/84 | HR 75 | Temp 97.8°F | Ht 66.0 in | Wt 145.0 lb

## 2016-10-16 DIAGNOSIS — R5383 Other fatigue: Secondary | ICD-10-CM | POA: Diagnosis not present

## 2016-10-16 DIAGNOSIS — G44221 Chronic tension-type headache, intractable: Secondary | ICD-10-CM | POA: Diagnosis not present

## 2016-10-16 DIAGNOSIS — E034 Atrophy of thyroid (acquired): Secondary | ICD-10-CM

## 2016-10-16 DIAGNOSIS — R413 Other amnesia: Secondary | ICD-10-CM | POA: Diagnosis not present

## 2016-10-16 DIAGNOSIS — F0390 Unspecified dementia without behavioral disturbance: Secondary | ICD-10-CM | POA: Insufficient documentation

## 2016-10-16 DIAGNOSIS — G3184 Mild cognitive impairment, so stated: Secondary | ICD-10-CM | POA: Insufficient documentation

## 2016-10-16 LAB — COMPREHENSIVE METABOLIC PANEL
ALBUMIN: 4.8 g/dL (ref 3.5–5.2)
ALT: 14 U/L (ref 0–35)
AST: 18 U/L (ref 0–37)
Alkaline Phosphatase: 66 U/L (ref 39–117)
BUN: 19 mg/dL (ref 6–23)
CALCIUM: 10.4 mg/dL (ref 8.4–10.5)
CHLORIDE: 96 meq/L (ref 96–112)
CO2: 35 mEq/L — ABNORMAL HIGH (ref 19–32)
CREATININE: 0.95 mg/dL (ref 0.40–1.20)
GFR: 60.48 mL/min (ref >60.00)
Glucose, Bld: 86 mg/dL (ref 70–99)
Potassium: 4.8 mEq/L (ref 3.5–5.1)
SODIUM: 135 meq/L (ref 135–145)
TOTAL PROTEIN: 7.8 g/dL (ref 6.0–8.3)
Total Bilirubin: 0.5 mg/dL (ref 0.2–1.2)

## 2016-10-16 LAB — CBC WITH DIFFERENTIAL/PLATELET
Basophils Absolute: 0 10*3/uL (ref 0.0–0.1)
Basophils Relative: 0.4 % (ref 0.0–3.0)
EOS ABS: 0.1 10*3/uL (ref 0.0–0.7)
Eosinophils Relative: 2.2 % (ref 0.0–5.0)
HEMATOCRIT: 39.8 % (ref 36.0–46.0)
HEMOGLOBIN: 13.5 g/dL (ref 12.0–15.0)
LYMPHS PCT: 25.5 % (ref 12.0–46.0)
Lymphs Abs: 1.5 10*3/uL (ref 0.7–4.0)
MCHC: 33.9 g/dL (ref 30.0–36.0)
MCV: 92.2 fl (ref 78.0–100.0)
MONO ABS: 0.5 10*3/uL (ref 0.1–1.0)
Monocytes Relative: 8.9 % (ref 3.0–12.0)
Neutro Abs: 3.6 10*3/uL (ref 1.4–7.7)
Neutrophils Relative %: 63 % (ref 43.0–77.0)
Platelets: 240 10*3/uL (ref 150.0–400.0)
RBC: 4.31 Mil/uL (ref 3.87–5.11)
RDW: 13.2 % (ref 11.5–15.5)
WBC: 5.7 10*3/uL (ref 4.0–10.5)

## 2016-10-16 LAB — C-REACTIVE PROTEIN: CRP: 0.2 mg/dL — ABNORMAL LOW (ref 0.5–20.0)

## 2016-10-16 LAB — SEDIMENTATION RATE: Sed Rate: 4 mm/hr (ref 0–30)

## 2016-10-16 LAB — VITAMIN B12: Vitamin B-12: 870 pg/mL (ref 211–911)

## 2016-10-16 LAB — TSH: TSH: 7.83 u[IU]/mL — AB (ref 0.35–4.50)

## 2016-10-16 NOTE — Assessment & Plan Note (Addendum)
S: Last year 10/06/15 AWV showed missing 2/3 recall items for score 28/30. She has felt like she has more mental slowing and memory issues A/P: Reassuring neuro exam.  Will get [facility]. Discussed reversible causes of dementia workup to include tsh, cmet, cbc, b12.. Declines hiv and rpr . Updated phq9 at upcoming AWV  With headaches and memory loss- also get MRI brain

## 2016-10-16 NOTE — Assessment & Plan Note (Signed)
S: poor control and worse from last check of 5.304.  Lab Results  Component Value Date   TSH 7.83 (H) 10/16/2016   On thyroid medication-levothyroxine 75 mcg A/P: will increase levothyroxine to 88 mcg- could be contributing to reported fatigue

## 2016-10-16 NOTE — Patient Instructions (Signed)
Please stop by lab before you go  We will call you within a week or two about your referral for MRI. If you do not hear within 3 weeks, give [place] a call.

## 2016-10-16 NOTE — Progress Notes (Signed)
Subjective:  Laura Li is a 75 y.o. year old very pleasant female patient who presents for/with See problem oriented charting ROS-  No facial or extremity weakness. No slurred words or trouble swallowing. no blurry vision or double vision. No paresthesias. No confusion or word finding difficulties. Does have chronic headache for months worse than typical for her   Past Medical History-  Patient Active Problem List   Diagnosis Date Noted  . Paroxysmal atrial fibrillation ([place]) 06/04/2016    Priority: High  . Chronic deep vein thrombosis (DVT) ([facility]) 09/01/2015    Priority: High  . Pneumothorax 06/15/2014    Priority: High  . Breast cancer of lower-outer quadrant of left female breast ([place]) 04/24/2014    Priority: High  . Breast cancer, right breast ([place]) 04/24/2014    Priority: High  . Vasculitis ([place]) 11/09/2012    Priority: High  . Acute lower UTI 05/29/2016    Priority: Medium  . Osteopenia 05/23/2016    Priority: Medium  . Melanoma of skin ([place]) 03/31/2014    Priority: Medium  . Chronic kidney disease, stage III (moderate) 12/09/2012    Priority: Medium  . 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    Priority: Medium  . Hypothyroidism 02/06/2006    Priority: Medium  . Hyperlipidemia 02/06/2006    Priority: Medium  . Essential hypertension 02/06/2006    Priority: Medium  . Localized swelling, mass, or lump of lower extremity 11/05/2014    Priority: Low  . Diverticulitis 08/09/2014    Priority: Low  . Family history of breast cancer 04/29/2014    Priority: Low  . GERD (gastroesophageal reflux disease) 03/31/2014    Priority: Low  . Hot flashes 03/31/2014    Priority: Low  . Osteoarthritis 03/31/2014    Priority: Low  . Allergic rhinitis 02/06/2006    Priority: Low  . Memory loss 10/16/2016  . Intermittent headache 09/19/2016  . Overactive bladder 09/19/2016    Medications- reviewed and updated Current Outpatient Prescriptions  Medication Sig Dispense  Refill  . acetaminophen (TYLENOL) 500 MG tablet Take 500 mg by mouth 2 (two) times daily as needed for mild pain. Reported on 07/07/2015    . atorvastatin (LIPITOR) 10 MG tablet Take 1 tablet (10 mg total) by mouth daily. 90 tablet 3  . B Complex-C-E-Zn (B COMPLEX-C-E-ZINC) tablet Take 1 tablet by mouth daily.    . B Complex-C-Folic Acid (STRESS FORMULA PO) Take 1 tablet by mouth every morning.     . Calcium-Vitamin D (CALTRATE 600 PLUS-VIT D PO) Take 1 tablet by mouth 2 (two) times daily.     . carboxymethylcellulose (REFRESH PLUS) 0.5 % SOLN 2 drops 3 (three) times daily as needed.    . ciclopirox (PENLAC) 8 % solution APPLY ON THE AFFECTED NAILS BID UTD  5  . diclofenac sodium (VOLTAREN) 1 % GEL Apply 2 g topically 4 (four) times daily. 100 g 5  . esomeprazole (NEXIUM) 40 MG capsule TAKE 1 BY MOUTH DAILY 90 capsule 2  . fexofenadine (ALLEGRA) 180 MG tablet Take 180 mg by mouth daily as needed for allergies.     [name] ipratropium (ATROVENT) 0.03 % nasal spray Place 2 sprays into the nose every 12 (twelve) hours. 180 mL 3  . Lactobacillus-Inulin (CULTURELLE DIGESTIVE HEALTH PO) Take 1 capsule by mouth daily.    [name] levothyroxine (SYNTHROID, LEVOTHROID) 75 MCG tablet Take 1 tablet (75 mcg total) by mouth daily. 90 tablet 1  . nadolol (CORGARD) 20 MG tablet TAKE 1/2 TABLET(10  MG) BY MOUTH DAILY 45 tablet 2  . Propylene Glycol (SYSTANE BALANCE) 0.6 % SOLN Apply 1-2 drops to eye daily as needed (dry eyes).     . rivaroxaban (XARELTO) 20 MG TABS tablet Take 1 tablet (20 mg total) by mouth daily with supper. 90 tablet 3  . venlafaxine XR (EFFEXOR-XR) 150 MG 24 hr capsule TAKE 1 CAPSULE(150 MG) BY MOUTH DAILY 90 capsule 2   No current facility-administered medications for this visit.     Objective: BP 138/84   Pulse 75   Temp 97.8 F (36.6 C) (Oral)   Ht _0  (1.676 m)   Wt 145 lb (65.8 kg)   SpO2 94%   BMI 23.40 kg/m  Gen: NAD, resting comfortably CV: RRR no murmurs rubs or gallops Lungs: CTAB  no crackles, wheeze, rhonchi Abdomen: soft/nontender/nondistended/normal bowel sounds. No rebound or guarding.  Ext: no edema Skin: warm, dry Neuro: CN II-XII intact, sensation and reflexes normal throughout, 5/5 muscle strength in bilateral upper and lower extremities. Normal finger to nose. Normal rapid alternating movements. No pronator drift. Normal romberg. Normal gait.  Takes prolonged period to answer at times  Assessment/Plan:  Fatigue, unspecified type - Plan: CBC with Differential/Platelet, Comprehensive metabolic panel, TSH, Sedimentation rate, C-reactive Protein, Vitamin B12  Memory loss - Plan: CBC with Differential/Platelet, Comprehensive metabolic panel, TSH, Vitamin B12, MR Brain W Wo Contrast  Chronic tension-type headache, intractable - Plan: TSH, Sedimentation rate, C-reactive Protein, MRI planned S: UTI in march and has not been feeling well, something "isn't right". Diffuse headaches, severe at times. For last week states she has had a headache for 24/7. Treated with antibiotic a week ago and states slightly better. Was 10/10 before now down to 7/10. Has had a tender spot in the upper scalp- getting slightly better. No blurry vision with this. Also feels slightly itchy in scalp and onto back- in area she used gel for pain on her back. Low energy since March. More easily flustered/more difficulty focusing. At times very mild nausea. Occasional incontinence - slightly more since march. Feels ma little more wobbly. Feels better when eating sweets- says "sweets make her happy". No decrease in appetite. Tylenol helps slightly short term  Has had headaches in past- saw Dr. Domingo Cocking headache specialist years ago. States never had this prolonged of a headache. Some days are worse than others- head and fatigue related  DId have  Had  CT head 05/28/16, Colon 2010 with 10 year repeat, follows closely with oncology- up to date on age appropriate cancer screening  A/P: Tension headache-  states Dr. Domingo Cocking diagnosed in past but has neve rhad this long of a headache and seems to be more intense (though she appears in no distress whatsoever today despite reported severe headache). CRP and ESR no televated doubt temporal arteritis. Will get MRI brain. For fatigue- labs as below- doubt this is malignancy related but intracranially will be ruled out by MRI  Hypothyroidism S: poor control and worse from last check of 5.304.  Lab Results  Component Value Date   TSH 7.83 (H) 10/16/2016   On thyroid medication-levothyroxine 75 mcg A/P: will increase levothyroxine to 88 mcg- could be contributing to reported fatigue    Memory loss S: Last year 10/06/15 AWV showed missing 2/3 recall items for score 28/30. She has felt like she has more mental slowing and memory issues A/P: Reassuring neuro exam.  Will get updated [facility]. Discussed reversible causes of dementia workup to include tsh, cmet,  cbc, b12.. Declines hiv and rpr . Updated phq9 at upcoming AWV  With headaches and memory loss- also get MRI brain  PRN follow up for this acute issue after workup  Orders Placed This Encounter  Procedures  . MR Brain W Wo Contrast    Standing Status:   Future    Standing Expiration Date:   12/16/2017    Order Specific Question:   If indicated for the ordered procedure, I authorize the administration of contrast media per Radiology protocol    Answer:   Yes    Order Specific Question:   What is the patient's sedation requirement?    Answer:   No Sedation    Order Specific Question:   Does the patient have a pacemaker or implanted devices?    Answer:   No    Order Specific Question:   Radiology Contrast Protocol - do NOT remove file path    Answer:   [email or web address]    Order Specific Question:   Preferred imaging location?    Answer:   [facility] (table limit-350 lbs)  . CBC with Differential/Platelet  . Comprehensive metabolic panel    [place]  .  TSH    [place]  . Sedimentation rate    [place]  . C-reactive Protein  . Vitamin B12   Return precautions advised.  [name], MD

## 2016-10-17 ENCOUNTER — Other Ambulatory Visit: Payer: Self-pay

## 2016-10-17 DIAGNOSIS — E039 Hypothyroidism, unspecified: Secondary | ICD-10-CM

## 2016-10-17 MED ORDER — LEVOTHYROXINE SODIUM 88 MCG PO TABS: 88.0000 ug | ORAL_TABLET | Freq: Every day | ORAL | 3 refills | Status: DC

## 2016-10-17 NOTE — Progress Notes (Signed)
Subjective:   Laura Li is a 76 y.o. female who presents for Medicare Annual (Subsequent) preventive examination.  The Patient was informed that the wellness visit is to identify future health risk and educate and initiate measures that can reduce risk for increased disease through the lifespan.    Annual Wellness Assessment  Reports health as terrible  Memory bad-Since the 1st of the year Has a H/a; no blurred vision or other visual issues Has her sunglasses on today; forgot her regular glasses States ha is a 5 in a 1 to 10 scale Slight nausea at times; a lot of imbalance issues; walks slow; is very cautious    Lives in 2 level home Has help to keep the home up Quit cooking Enjoys eating out   Preventive Screening -Counseling & Management  Medicare Annual Preventive Care Visit - Subsequent Last OV 08/08 Seen Dr.Hunter  Colonoscopy  11/2008 Mammogram 03/2016 Dexa 03/9145 -2.4- taking calicum  Memory is an issue Dtr; in town; 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 States her dtr is her HCPOA  Patient Care Team: Marin Olp, MD as PCP - General (Family Medicine) Rolm Bookbinder, MD as Consulting Physician (General Surgery) Thea Silversmith, MD (Inactive) as Consulting Physician (Radiation Oncology) Magrinat, Virgie Dad, MD as Consulting Physician (Oncology) Sylvan Cheese, NP as Nurse Practitioner (Hematology and Oncology) Assessed for additional providers  Immunization History  Administered Date(s) Administered  . Influenza Split 02/11/2011  . Influenza Whole 12/21/2008, 01/16/2010  . Influenza,  High Dose Seasonal PF 12/05/2015  . Influenza,inj,Quad PF,36+ Mos 12/21/2013, 11/22/2014  . Influenza-Unspecified 12/09/2012  . Pneumococcal Conjugate-13 09/01/2015  . Pneumococcal Polysaccharide-23 03/11/2001, 10/18/2016  . Td 03/19/2010   Required Immunizations needed today  Screening test up to date or reviewed for plan of completion Health Maintenance Due  Topic Date Due  . PNA vac Low Risk Adult (2 of 2 - PPSV23) 08/31/2016  . INFLUENZA VACCINE  10/09/2016     Took the PSV 23 today. Flu vaccine not available as yet   Cardiac Risk Factors include: advanced age (>89men, >16 women);hypertension     Objective:     Vitals: BP 116/60   Pulse 76   Ht 5\' 6"  (1.676 m)   Wt 144 lb (65.3 kg)   SpO2 98%   BMI 23.24 kg/m   Body mass index is 23.24 kg/m.   Tobacco History  Smoking Status  . Former Smoker  . Packs/day: 0.75  . Years: 3.00  . Types: Cigarettes  . Quit date: 03/11/1969  Smokeless Tobacco  . Never Used     Counseling given: Yes   Past Medical History:  Diagnosis Date  . Anemia    from medicaions  . Arthritis   . Chronic kidney disease    some renal impairment from meds  . Depression    states no depression  . Eczema   . Esophageal stricture   . GERD (gastroesophageal reflux disease)   . Headache   . Heart murmur    states it is benign told 30-40 years ago  . History of  DVT (deep vein thrombosis)   . HX: breast cancer    melanoma  . Hyperlipidemia   . Hypertension   . Hypothyroidism   . Melanoma ([place])   . OA (osteoarthritis)   . Pneumothorax on right 06/15/2014  . PONV (postoperative nausea and vomiting)   . Wears glasses    Past Surgical History:  Procedure Laterality Date  . APPENDECTOMY  1967  . BREAST LUMPECTOMY     left breast-benign  . BREAST SURGERY  1977   removal of calcified milk gland right breast  . CHEST TUBE INSERTION  06/15/2014  . CHEST TUBE INSERTION Bilateral 06/15/2014   Procedure: CHEST TUBE INSERTION;  Surgeon:  Rolm Bookbinder, MD;  Location: [place];  Service: General;  Laterality: Bilateral;  . COLONOSCOPY    . LIPOMA EXCISION  1978   right breast  . MASTECTOMY  1998   right-nodes out  . MELANOMA EXCISION  2004   right side of face  . MOHS SURGERY  2005   right left-basal cell  . PORT-A-CATH REMOVAL N/A 10/05/2014   Procedure: REMOVAL PORT-A-CATH;  Surgeon: Rolm Bookbinder, MD;  Location: [place];  Service: General;  Laterality: N/A;  . PORTACATH PLACEMENT Left 06/15/2014   Procedure: INSERTION PORT-A-CATH;  Surgeon: Rolm Bookbinder, MD;  Location: [place];  Service: General;  Laterality: Left;  . RADIOACTIVE SEED GUIDED MASTECTOMY WITH AXILLARY SENTINEL LYMPH NODE BIOPSY Left 05/05/2014   Procedure: RADIOACTIVE SEED GUIDED LEFT LUMPECTOMY WITH AXILLARY SENTINEL LYMPH NODE BIOPSY;  Surgeon: Rolm Bookbinder, MD;  Location: [place];  Service: General;  Laterality: Left;  . TONSILLECTOMY AND ADENOIDECTOMY  1949  . TOTAL ABDOMINAL HYSTERECTOMY  1992   including ovaries   Family History  Problem Relation Age of Onset  . Heart disease Father 89       smoker  . Heart attack Father   . Endometrial cancer Mother 51  . Breast cancer Cousin        x 3 cousins with breast cancer (one at 63, one at 78 and one at 23)  . Lung cancer Paternal Aunt        smoker  . Brain cancer Maternal Aunt   . Diabetes Maternal Aunt        ???  . Breast cancer Maternal Aunt 51  . Healthy Sister   . Colon cancer Neg Hx    History  Sexual Activity  . Sexual activity: No    Outpatient Encounter Prescriptions as of 10/18/2016  Medication Sig  . acetaminophen (TYLENOL) 500 MG tablet Take 500 mg by mouth 2 (two) times daily as needed for mild pain. Reported on 07/07/2015  . atorvastatin (LIPITOR) 10 MG tablet Take 1 tablet (10 mg total) by mouth daily.  . B Complex-C-E-Zn (B COMPLEX-C-E-ZINC) tablet Take 1 tablet by mouth daily.  . B Complex-C-Folic Acid (STRESS FORMULA PO) Take  1 tablet by mouth every morning.   . Calcium-Vitamin D (CALTRATE 600 PLUS-VIT D PO) Take 1 tablet by mouth 2 (two) times daily.   . carboxymethylcellulose (REFRESH PLUS) 0.5 % SOLN 2 drops 3 (three) times daily as needed.  . ciclopirox (PENLAC) 8 % solution APPLY ON THE AFFECTED NAILS BID UTD  . diclofenac sodium (VOLTAREN) 1 % GEL Apply 2 g topically 4 (four) times daily.  [name] esomeprazole (NEXIUM) 40 MG capsule TAKE 1 BY MOUTH DAILY  . fexofenadine (ALLEGRA) 180 MG tablet Take 180 mg by mouth daily as needed for allergies.   [name]  ipratropium (ATROVENT) 0.03 % nasal spray Place 2 sprays into the nose every 12 (twelve) hours.  . Lactobacillus-Inulin (CULTURELLE DIGESTIVE HEALTH PO) Take 1 capsule by mouth daily.  [name] levothyroxine (SYNTHROID, LEVOTHROID) 88 MCG tablet Take 1 tablet (88 mcg total) by mouth daily.  . nadolol (CORGARD) 20 MG tablet TAKE 1/2 TABLET(10 MG) BY MOUTH DAILY  . Propylene Glycol (SYSTANE BALANCE) 0.6 % SOLN Apply 1-2 drops to eye daily as needed (dry eyes).   . rivaroxaban (XARELTO) 20 MG TABS tablet Take 1 tablet (20 mg total) by mouth daily with supper.  . venlafaxine XR (EFFEXOR-XR) 150 MG 24 hr capsule TAKE 1 CAPSULE(150 MG) BY MOUTH DAILY   No facility-administered encounter medications on file as of 10/18/2016.     Activities of Daily Living In your present state of health, do you have any difficulty performing the following activities: 10/18/2016 05/29/2016  Hearing? Y N  Vision? N N  Difficulty concentrating or making decisions? - Y  Walking or climbing stairs? N N  Dressing or bathing? N N  Doing errands, shopping? N N  Preparing Food and eating ? Y -  Using the Toilet? N -  In the past six months, have you accidently leaked urine? Y -  Comment incontinence -  Do you have problems with loss of bowel control? N -  Managing your Medications? N -  Managing your Finances? N -  Housekeeping or managing your Housekeeping? N -  Some recent data might be hidden     Patient Care Team: Marin Olp, MD as PCP - General (Family Medicine) Rolm Bookbinder, MD as Consulting Physician (General Surgery) Thea Silversmith, MD (Inactive) as Consulting Physician (Radiation Oncology) Magrinat, Virgie Dad, MD as Consulting Physician (Oncology) Sylvan Cheese, NP as Nurse Practitioner (Hematology and Oncology)    Assessment:     Exercise Activities and Dietary recommendations Current Exercise Habits: The patient does not participate in regular exercise at present  Goals    . Exercise 150 minutes per week (moderate activity)          Goal is to find a rhythm of exercise you enjoy and put on it on your calender Start low and slow  Y or other with a walking trial;  Has a program at [employer] that physiology students did work with older adults     . patient          Rethinking long term care plan [employer]; [phone] Senior Directory; Information regarding Long Term Care  Visit places at different; You can call and go there for lunch'        Fall Risk Fall Risk  10/18/2016 10/18/2016 10/18/2016 10/06/2015 01/19/2015  Falls in the past year? Yes No No Yes No  Comment fell [place] walking [place] the beach - - - -  Number falls in past yr: 1 - - 2 or more -  Injury with Fall? - - - No -  Comment - - - getting in a hurry; fell going down the steps f -  Risk for fall due to : Impaired balance/gait - - - -  Follow up - - - Education provided -   Depression Screen PHQ 2/9 Scores 10/18/2016 10/06/2015 01/19/2015 09/21/2014  PHQ - 2 Score 2 1 0 0  PHQ- 9 Score 7 - - -      Cognitive Function MMSE - Mini Mental State Exam 10/18/2016 10/06/2015  Not completed: Refused -  Orientation to time 4 4  Orientation to Place 5 5  Registration 3 3  Attention/ Calculation 3 5  Attention/Calculation-comments serial 3 from 20; (1 serials 7 from 100) -  Recall 3 2  Language- name 2 objects 2 2  Language- repeat  1 1  Language- follow 3 step command 2 3  Language- read & follow direction 1 1  Write a sentence 1 1  Copy design 1 1  Total score 26 28        Immunization History  Administered Date(s) Administered  . Influenza Split 02/11/2011  . Influenza Whole 12/21/2008, 01/16/2010  . Influenza, High Dose Seasonal PF 12/05/2015  . Influenza,inj,Quad PF,36+ Mos 12/21/2013, 11/22/2014  . Influenza-Unspecified 12/09/2012  . Pneumococcal Conjugate-13 09/01/2015  . Pneumococcal Polysaccharide-23 03/11/2001, 10/18/2016  . Td 03/19/2010   Screening Tests Health Maintenance  Topic Date Due  . PNA vac Low Risk Adult (2 of 2 - PPSV23) 08/31/2016  . INFLUENZA VACCINE  10/09/2016  . TETANUS/TDAP  03/19/2020  . DEXA SCAN  Addressed      Plan:     PCP Notes   Health Maintenance Mammogram in January 2018 PSV 23 given today Will take her flu vaccine when available  Abnormal Screens  PHq 6 Denies depression; but states she is not sleeping as well; worries a lot about her memory and why she can't figure out numbers.  She is on Effexor; little energy Appetite is good; focus is poor;  If dementia is etiology, seems to have insight and concern with her problems. Difficulty with serial 7's from 100 but was able to to subtract serial 3 from 20 x 3 Recall 3 of 3 Missed date, directions x 1;  MMSE 28 last year and 26 this year;   Has slow gait;   Referrals  No referrals made today  Patient concerns; Patient is very anxious over her memory loss issues. Continues to have headaches which she states is long-term. No visual disturbance with these headaches per her report. Is upset as she is having more difficulty with simple math. Career was in a Art therapist Concerns; C/o of some incontinence and getting up a lot going to the bathroom;   Next PCP apt The patient was seen yesterday by Dr. Yong Channel. She is scheduled for an MRI later this month.    I have personally reviewed and noted the  following in the patient's chart:   . Medical and social history . Use of alcohol, tobacco or illicit drugs  . Current medications and supplements . Functional ability and status . Nutritional status . Physical activity . Advanced directives . List of other physicians . Hospitalizations, surgeries, and ER visits in previous 12 months . Vitals . Screenings to include cognitive, depression, and falls . Referrals and appointments  In addition, I have reviewed and discussed with patient certain preventive protocols, quality metrics, and best practice recommendations. A written personalized care plan for preventive services as well as general preventive health recommendations were provided to patient.     Wynetta Fines, RN  10/18/2016

## 2016-10-18 ENCOUNTER — Ambulatory Visit (INDEPENDENT_AMBULATORY_CARE_PROVIDER_SITE_OTHER): Payer: Medicare Other

## 2016-10-18 VITALS — BP 116/60 | HR 76 | Ht 66.0 in | Wt 144.0 lb

## 2016-10-18 DIAGNOSIS — Z0000 Encounter for general adult medical examination without abnormal findings: Secondary | ICD-10-CM

## 2016-10-18 DIAGNOSIS — Z23 Encounter for immunization: Secondary | ICD-10-CM

## 2016-10-18 NOTE — Progress Notes (Signed)
I have reviewed and agree with note, evaluation, plan. Some worsening on MMSE- may represent dementia. Proceed with MRI.   [name], MD

## 2016-10-18 NOTE — Patient Instructions (Addendum)
Ms. Ribera , Thank you for taking time to come for your Medicare Wellness Visit. I appreciate your ongoing commitment to your health goals. Please review the following plan we discussed and let me know if I can assist you in the future.   Will take  Your pneumovax 23 (pneumonia vaccine today) this is your LAST Pneumonia vaccine  Will hold exercise until you are feeling better   These are the goals we discussed: Goals    . Exercise 150 minutes per week (moderate activity)          Goal is to find a rhythm of exercise you enjoy and put on it on your calender Start low and slow  Y or other with a walking trial;  Has a program at [employer] that physiology students did work with older adults     . patient          Rethinking long term care plan [email or web address]; [phone] Senior Directory; Information regarding Long Term Care  Visit places at different; You can call and go there for lunch'         This is a list of the screening recommended for you and due dates:  Health Maintenance  Topic Date Due  . Pneumonia vaccines (2 of 2 - PPSV23) 08/31/2016  . Flu Shot  10/09/2016  . Tetanus Vaccine  03/19/2020  . DEXA scan (bone density measurement)  Addressed    Health Maintenance, Female Adopting a healthy lifestyle and getting preventive care can go a long way to promote health and wellness. Talk with your health care provider about what schedule of regular examinations is right for you. This is a good chance for you to check in with your provider about disease prevention and staying healthy. In between checkups, there are plenty of things you can do on your own. Experts have done a lot of research about which lifestyle changes and preventive measures are most likely to keep you healthy. Ask your health care provider for more information. Weight and diet Eat a healthy diet  Be sure to include plenty of vegetables, fruits, low-fat dairy  products, and lean protein.  Do not eat a lot of foods high in solid fats, added sugars, or salt.  Get regular exercise. This is one of the most important things you can do for your health. ? Most adults should exercise for at least 150 minutes each week. The exercise should increase your heart rate and make you sweat (moderate-intensity exercise). ? Most adults should also do strengthening exercises at least twice a week. This is in addition to the moderate-intensity exercise.  Maintain a healthy weight  Body mass index (BMI) is a measurement that can be used to identify possible weight problems. It estimates body fat based on height and weight. Your health care provider can help determine your BMI and help you achieve or maintain a healthy weight.  For females 78 years of age and older: ? A BMI below 18.5 is considered underweight. ? A BMI of 18.5 to 24.9 is normal. ? A BMI of 25 to 29.9 is considered overweight. ? A BMI of 30 and above is considered obese.  Watch levels of cholesterol and blood lipids  You should start having your blood tested for lipids and cholesterol at 78 years of age, then have this test every 5 years.  You may need to have your cholesterol levels checked more often if: ? Your lipid or cholesterol levels are high. ?  You are older than 78 years of age. ? You are at high risk for heart disease.  Cancer screening Lung Cancer  Lung cancer screening is recommended for adults 19-60 years old who are at high risk for lung cancer because of a history of smoking.  A yearly low-dose CT scan of the lungs is recommended for people who: ? Currently smoke. ? Have quit within the past 15 years. ? Have at least a 30-pack-year history of smoking. A pack year is smoking an average of one pack of cigarettes a day for 1 year.  Yearly screening should continue until it has been 15 years since you quit.  Yearly screening should stop if you develop a health problem that would  prevent you from having lung cancer treatment.  Breast Cancer  Practice breast self-awareness. This means understanding how your breasts normally appear and feel.  It also means doing regular breast self-exams. Let your health care provider know about any changes, no matter how small.  If you are in your 20s or 30s, you should have a clinical breast exam (CBE) by a health care provider every 1-3 years as part of a regular health exam.  If you are 78 or older, have a CBE every year. Also consider having a breast X-ray (mammogram) every year.  If you have a family history of breast cancer, talk to your health care provider about genetic screening.  If you are at high risk for breast cancer, talk to your health care provider about having an MRI and a mammogram every year.  Breast cancer gene (BRCA) assessment is recommended for women who have family members with BRCA-related cancers. BRCA-related cancers include: ? Breast. ? Ovarian. ? Tubal. ? Peritoneal cancers.  Results of the assessment will determine the need for genetic counseling and BRCA1 and BRCA2 testing.  Cervical Cancer Your health care provider may recommend that you be screened regularly for cancer of the pelvic organs (ovaries, uterus, and vagina). This screening involves a pelvic examination, including checking for microscopic changes to the surface of your cervix (Pap test). You may be encouraged to have this screening done every 3 years, beginning at age 78.  For women ages 66-65, health care providers may recommend pelvic exams and Pap testing every 3 years, or they may recommend the Pap and pelvic exam, combined with testing for human papilloma virus (HPV), every 5 years. Some types of HPV increase your risk of cervical cancer. Testing for HPV may also be done on women of any age with unclear Pap test results.  Other health care providers may not recommend any screening for nonpregnant women who are considered low risk  for pelvic cancer and who do not have symptoms. Ask your health care provider if a screening pelvic exam is right for you.  If you have had past treatment for cervical cancer or a condition that could lead to cancer, you need Pap tests and screening for cancer for at least 20 years after your treatment. If Pap tests have been discontinued, your risk factors (such as having a new sexual partner) need to be reassessed to determine if screening should resume. Some women have medical problems that increase the chance of getting cervical cancer. In these cases, your health care provider may recommend more frequent screening and Pap tests.  Colorectal Cancer  This type of cancer can be detected and often prevented.  Routine colorectal cancer screening usually begins at 78 years of age and continues through 78 years of  age.  Your health care provider may recommend screening at an earlier age if you have risk factors for colon cancer.  Your health care provider may also recommend using home test kits to check for hidden blood in the stool.  A small camera at the end of a tube can be used to examine your colon directly (sigmoidoscopy or colonoscopy). This is done to check for the earliest forms of colorectal cancer.  Routine screening usually begins at age 50.  Direct examination of the colon should be repeated every 5-10 years through 78 years of age. However, you may need to be screened more often if early forms of precancerous polyps or small growths are found.  Skin Cancer  Check your skin from head to toe regularly.  Tell your health care provider about any new moles or changes in moles, especially if there is a change in a mole's shape or color.  Also tell your health care provider if you have a mole that is larger than the size of a pencil eraser.  Always use sunscreen. Apply sunscreen liberally and repeatedly throughout the day.  Protect yourself by wearing long sleeves, pants, a  wide-brimmed hat, and sunglasses whenever you are outside.  Heart disease, diabetes, and high blood pressure  High blood pressure causes heart disease and increases the risk of stroke. High blood pressure is more likely to develop in: ? People who have blood pressure in the high end of the normal range (130-139/85-89 mm Hg). ? People who are overweight or obese. ? People who are African American.  If you are 18-39 years of age, have your blood pressure checked every 3-5 years. If you are 40 years of age or older, have your blood pressure checked every year. You should have your blood pressure measured twice-once when you are at a hospital or clinic, and once when you are not at a hospital or clinic. Record the average of the two measurements. To check your blood pressure when you are not at a hospital or clinic, you can use: ? An automated blood pressure machine at a pharmacy. ? A home blood pressure monitor.  If you are between 55 years and 79 years old, ask your health care provider if you should take aspirin to prevent strokes.  Have regular diabetes screenings. This involves taking a blood sample to check your fasting blood sugar level. ? If you are at a normal weight and have a low risk for diabetes, have this test once every three years after 78 years of age. ? If you are overweight and have a high risk for diabetes, consider being tested at a younger age or more often. Preventing infection Hepatitis B  If you have a higher risk for hepatitis B, you should be screened for this virus. You are considered at high risk for hepatitis B if: ? You were born in a country where hepatitis B is common. Ask your health care provider which countries are considered high risk. ? Your parents were born in a high-risk country, and you have not been immunized against hepatitis B (hepatitis B vaccine). ? You have HIV or AIDS. ? You use needles to inject street drugs. ? You live with someone who has  hepatitis B. ? You have had sex with someone who has hepatitis B. ? You get hemodialysis treatment. ? You take certain medicines for conditions, including cancer, organ transplantation, and autoimmune conditions.  Hepatitis C  Blood testing is recommended for: ? Everyone born from   1947 through [birth year]. ? Anyone with known risk factors for hepatitis C.  Sexually transmitted infections (STIs)  You should be screened for sexually transmitted infections (STIs) including gonorrhea and chlamydia if: ? You are sexually active and are younger than 78 years of age. ? You are older than 78 years of age and your health care provider tells you that you are at risk for this type of infection. ? Your sexual activity has changed since you were last screened and you are at an increased risk for chlamydia or gonorrhea. Ask your health care provider if you are at risk.  If you do not have HIV, but are at risk, it may be recommended that you take a prescription medicine daily to prevent HIV infection. This is called pre-exposure prophylaxis (PrEP). You are considered at risk if: ? You are sexually active and do not regularly use condoms or know the HIV status of your partner(s). ? You take drugs by injection. ? You are sexually active with a partner who has HIV.  Talk with your health care provider about whether you are at high risk of being infected with HIV. If you choose to begin PrEP, you should first be tested for HIV. You should then be tested every 3 months for as long as you are taking PrEP. Pregnancy  If you are premenopausal and you may become pregnant, ask your health care provider about preconception counseling.  If you may become pregnant, take 400 to 800 micrograms (mcg) of folic acid every day.  If you want to prevent pregnancy, talk to your health care provider about birth control (contraception). Osteoporosis and menopause  Osteoporosis is a disease in which the bones lose minerals and  strength with aging. This can result in serious bone fractures. Your risk for osteoporosis can be identified using a bone density scan.  If you are 50 years of age or older, or if you are at risk for osteoporosis and fractures, ask your health care provider if you should be screened.  Ask your health care provider whether you should take a calcium or vitamin D supplement to lower your risk for osteoporosis.  Menopause may have certain physical symptoms and risks.  Hormone replacement therapy may reduce some of these symptoms and risks. Talk to your health care provider about whether hormone replacement therapy is right for you. Follow these instructions at home:  Schedule regular health, dental, and eye exams.  Stay current with your immunizations.  Do not use any tobacco products including cigarettes, chewing tobacco, or electronic cigarettes.  If you are pregnant, do not drink alcohol.  If you are breastfeeding, limit how much and how often you drink alcohol.  Limit alcohol intake to no more than 1 drink per day for nonpregnant women. One drink equals 12 ounces of beer, 5 ounces of wine, or 1 ounces of hard liquor.  Do not use street drugs.  Do not share needles.  Ask your health care provider for help if you need support or information about quitting drugs.  Tell your health care provider if you often feel depressed.  Tell your health care provider if you have ever been abused or do not feel safe at home. This information is not intended to replace advice given to you by your health care provider. Make sure you discuss any questions you have with your health care provider. Document Released: 09/10/2010 Document Revised: 08/03/2015 Document Reviewed: 11/29/2014 Elsevier Interactive Patient Education  2018 [place]  Prevention in the Home Falls can cause injuries and can affect people from all age groups. There are many simple things that you can do to make your  home safe and to help prevent falls. What can I do on the outside of my home?  Regularly repair the edges of walkways and driveways and fix any cracks.  Remove high doorway thresholds.  Trim any shrubbery on the main path into your home.  Use bright outdoor lighting.  Clear walkways of debris and clutter, including tools and rocks.  Regularly check that handrails are securely fastened and in good repair. Both sides of any steps should have handrails.  Install guardrails along the edges of any raised decks or porches.  Have leaves, snow, and ice cleared regularly.  Use sand or salt on walkways during winter months.  In the garage, clean up any spills right away, including grease or oil spills. What can I do in the bathroom?  Use night lights.  Install grab bars by the toilet and in the tub and shower. Do not use towel bars as grab bars.  Use non-skid mats or decals on the floor of the tub or shower.  If you need to sit down while you are in the shower, use a plastic, non-slip stool.  Keep the floor dry. Immediately clean up any water that spills on the floor.  Remove soap buildup in the tub or shower on a regular basis.  Attach bath mats securely with double-sided non-slip rug tape.  Remove throw rugs and other tripping hazards from the floor. What can I do in the bedroom?  Use night lights.  Make sure that a bedside light is easy to reach.  Do not use oversized bedding that drapes onto the floor.  Have a firm chair that has side arms to use for getting dressed.  Remove throw rugs and other tripping hazards from the floor. What can I do in the kitchen?  Clean up any spills right away.  Avoid walking on wet floors.  Place frequently used items in easy-to-reach places.  If you need to reach for something above you, use a sturdy step stool that has a grab bar.  Keep electrical cables out of the way.  Do not use floor polish or wax that makes floors slippery.  If you have to use wax, make sure that it is non-skid floor wax.  Remove throw rugs and other tripping hazards from the floor. What can I do in the stairways?  Do not leave any items on the stairs.  Make sure that there are handrails on both sides of the stairs. Fix handrails that are broken or loose. Make sure that handrails are as long as the stairways.  Check any carpeting to make sure that it is firmly attached to the stairs. Fix any carpet that is loose or worn.  Avoid having throw rugs at the top or bottom of stairways, or secure the rugs with carpet tape to prevent them from moving.  Make sure that you have a light switch at the top of the stairs and the bottom of the stairs. If you do not have them, have them installed. What are some other fall prevention tips?  Wear closed-toe shoes that fit well and support your feet. Wear shoes that have rubber soles or low heels.  When you use a stepladder, make sure that it is completely opened and that the sides are firmly locked. Have someone hold the ladder while you are using  it. Do not climb a closed stepladder.  Add color or contrast paint or tape to grab bars and handrails in your home. Place contrasting color strips on the first and last steps.  Use mobility aids as needed, such as canes, walkers, scooters, and crutches.  Turn on lights if it is dark. Replace any light bulbs that burn out.  Set up furniture so that there are clear paths. Keep the furniture in the same spot.  Fix any uneven floor surfaces.  Choose a carpet design that does not hide the edge of steps of a stairway.  Be aware of any and all pets.  Review your medicines with your healthcare provider. Some medicines can cause dizziness or changes in blood pressure, which increase your risk of falling. Talk with your health care provider about other ways that you can decrease your risk of falls. This may include working with a physical therapist or trainer to improve  your strength, balance, and endurance. This information is not intended to replace advice given to you by your health care provider. Make sure you discuss any questions you have with your health care provider. Document Released: 02/15/2002 Document Revised: 07/25/2015 Document Reviewed: 04/01/2014 Elsevier Interactive Patient Education  2017 [employer].

## 2016-10-31 ENCOUNTER — Ambulatory Visit
Admission: RE | Admit: 2016-10-31 | Discharge: 2016-10-31 | Disposition: A | Discharge status: Home / Self Care | Payer: Medicare Other | Source: Ambulatory Visit | Attending: Family Medicine | Admitting: Family Medicine

## 2016-10-31 DIAGNOSIS — R413 Other amnesia: Secondary | ICD-10-CM

## 2016-10-31 DIAGNOSIS — S0990XA Unspecified injury of head, initial encounter: Secondary | ICD-10-CM | POA: Diagnosis not present

## 2016-10-31 MED ORDER — GADOBENATE DIMEGLUMINE 529 MG/ML IV SOLN
13.0000 mL | Freq: Once | INTRAVENOUS | Status: AC | PRN
  Administered 2016-10-31: 13 mL via INTRAVENOUS

## 2016-11-05 ENCOUNTER — Other Ambulatory Visit: Payer: Self-pay | Admitting: Family Medicine

## 2016-11-07 ENCOUNTER — Other Ambulatory Visit: Payer: Self-pay | Admitting: Family Medicine

## 2016-11-07 DIAGNOSIS — I82541 Chronic embolism and thrombosis of right tibial vein: Secondary | ICD-10-CM

## 2016-11-07 NOTE — Telephone Encounter (Signed)
Pt would like to know if she can have 12 rivaroxaban (XARELTO) 20 MG TABS tablet  Pt states her Rx will be here in 17 days and she has only 5 tabs left.

## 2016-11-08 MED ORDER — RIVAROXABAN 20 MG PO TABS: 20.0000 mg | ORAL_TABLET | Freq: Every day | ORAL | 0 refills | Status: DC

## 2016-11-08 NOTE — Addendum Note (Signed)
Addended by: Dorrene German on: 11/08/2016 04:56 PM   Modules accepted: Orders

## 2016-11-08 NOTE — Telephone Encounter (Signed)
Pt came by checking status of XARELTO. Pt also has a question about Levothyroxine. She has one bottle dated 11/05/16 for 75 MCG and another bottle dated 10/17/16 for 88 MCG.  Which dosage/bottle should she be taking? Home [phone]. Cell [phone]. cb

## 2016-11-08 NOTE — Telephone Encounter (Signed)
Xarelto filled to pharmacy as requested.  Pt notified to take 88 mcg levothyroxine per last result note.

## 2016-11-25 ENCOUNTER — Other Ambulatory Visit: Payer: Self-pay

## 2016-11-25 MED ORDER — ATORVASTATIN CALCIUM 10 MG PO TABS: 10.0000 mg | ORAL_TABLET | Freq: Every day | ORAL | 3 refills | Status: DC

## 2016-12-02 ENCOUNTER — Other Ambulatory Visit (INDEPENDENT_AMBULATORY_CARE_PROVIDER_SITE_OTHER): Payer: Medicare Other

## 2016-12-02 DIAGNOSIS — E039 Hypothyroidism, unspecified: Secondary | ICD-10-CM | POA: Diagnosis not present

## 2016-12-02 LAB — TSH: TSH: 0.88 u[IU]/mL (ref 0.35–4.50)

## 2016-12-05 DIAGNOSIS — Z961 Presence of intraocular lens: Secondary | ICD-10-CM | POA: Diagnosis not present

## 2016-12-05 DIAGNOSIS — H524 Presbyopia: Secondary | ICD-10-CM | POA: Diagnosis not present

## 2016-12-05 DIAGNOSIS — H26491 Other secondary cataract, right eye: Secondary | ICD-10-CM | POA: Diagnosis not present

## 2016-12-05 DIAGNOSIS — H04123 Dry eye syndrome of bilateral lacrimal glands: Secondary | ICD-10-CM | POA: Diagnosis not present

## 2016-12-18 ENCOUNTER — Ambulatory Visit (INDEPENDENT_AMBULATORY_CARE_PROVIDER_SITE_OTHER): Payer: Medicare Other | Admitting: *Deleted

## 2016-12-18 DIAGNOSIS — Z23 Encounter for immunization: Secondary | ICD-10-CM

## 2016-12-26 ENCOUNTER — Ambulatory Visit: Payer: Medicare Other | Admitting: Family Medicine

## 2016-12-27 ENCOUNTER — Other Ambulatory Visit: Payer: Self-pay | Admitting: *Deleted

## 2016-12-27 MED ORDER — ESOMEPRAZOLE MAGNESIUM 40 MG PO CPDR: DELAYED_RELEASE_CAPSULE | ORAL | 2 refills | Status: DC

## 2017-01-27 DIAGNOSIS — H01001 Unspecified blepharitis right upper eyelid: Secondary | ICD-10-CM | POA: Diagnosis not present

## 2017-01-27 DIAGNOSIS — H04123 Dry eye syndrome of bilateral lacrimal glands: Secondary | ICD-10-CM | POA: Diagnosis not present

## 2017-01-27 DIAGNOSIS — H01004 Unspecified blepharitis left upper eyelid: Secondary | ICD-10-CM | POA: Diagnosis not present

## 2017-01-27 DIAGNOSIS — H01002 Unspecified blepharitis right lower eyelid: Secondary | ICD-10-CM | POA: Diagnosis not present

## 2017-02-10 DIAGNOSIS — H01005 Unspecified blepharitis left lower eyelid: Secondary | ICD-10-CM | POA: Diagnosis not present

## 2017-02-10 DIAGNOSIS — H01001 Unspecified blepharitis right upper eyelid: Secondary | ICD-10-CM | POA: Diagnosis not present

## 2017-02-10 DIAGNOSIS — H01002 Unspecified blepharitis right lower eyelid: Secondary | ICD-10-CM | POA: Diagnosis not present

## 2017-02-10 DIAGNOSIS — H01004 Unspecified blepharitis left upper eyelid: Secondary | ICD-10-CM | POA: Diagnosis not present

## 2017-02-21 ENCOUNTER — Ambulatory Visit: Payer: Medicare Other | Admitting: Internal Medicine

## 2017-02-24 ENCOUNTER — Other Ambulatory Visit: Payer: Self-pay | Admitting: Oncology

## 2017-02-24 DIAGNOSIS — Z139 Encounter for screening, unspecified: Secondary | ICD-10-CM

## 2017-03-21 DIAGNOSIS — H18831 Recurrent erosion of cornea, right eye: Secondary | ICD-10-CM | POA: Diagnosis not present

## 2017-03-21 DIAGNOSIS — H1859 Other hereditary corneal dystrophies: Secondary | ICD-10-CM | POA: Diagnosis not present

## 2017-03-21 DIAGNOSIS — H04123 Dry eye syndrome of bilateral lacrimal glands: Secondary | ICD-10-CM | POA: Diagnosis not present

## 2017-03-25 ENCOUNTER — Other Ambulatory Visit: Payer: Self-pay | Admitting: Family Medicine

## 2017-03-25 DIAGNOSIS — J309 Allergic rhinitis, unspecified: Secondary | ICD-10-CM

## 2017-03-25 MED ORDER — IPRATROPIUM BROMIDE 0.03 % NA SOLN: 2.0000 | Freq: Two times a day (BID) | NASAL | 3 refills | Status: DC

## 2017-03-25 NOTE — Telephone Encounter (Signed)
Spoke to pt, told her Rx sent to [facility] as requested. Pt verbalized understanding.

## 2017-03-25 NOTE — Telephone Encounter (Signed)
Please see medication refill request in the note below and advise.

## 2017-03-25 NOTE — Telephone Encounter (Signed)
Copied from [facility] [phone]. Topic: Quick Communication - See Telephone Encounter >> Mar 25, 2017 10:47 AM Oneta Rack wrote: CRM for notification. See Telephone encounter for:   03/25/17.  [place], [place] [phone] (Phone) [phone] (Fax)  Pharmacy requesting ipratropium (ATROVENT) 0.03 % nasal spray

## 2017-04-10 ENCOUNTER — Ambulatory Visit
Admission: RE | Admit: 2017-04-10 | Discharge: 2017-04-10 | Disposition: A | Discharge status: Home / Self Care | Payer: Medicare Other | Source: Ambulatory Visit | Attending: Oncology | Admitting: Oncology

## 2017-04-10 ENCOUNTER — Other Ambulatory Visit: Payer: Self-pay | Admitting: Oncology

## 2017-04-10 DIAGNOSIS — Z139 Encounter for screening, unspecified: Secondary | ICD-10-CM

## 2017-04-10 DIAGNOSIS — R922 Inconclusive mammogram: Secondary | ICD-10-CM | POA: Diagnosis not present

## 2017-04-10 DIAGNOSIS — Z853 Personal history of malignant neoplasm of breast: Secondary | ICD-10-CM

## 2017-04-10 HISTORY — DX: Personal history of irradiation: Z92.3

## 2017-04-10 HISTORY — DX: Personal history of antineoplastic chemotherapy: Z92.21

## 2017-04-23 ENCOUNTER — Encounter: Payer: Self-pay | Admitting: Internal Medicine

## 2017-04-23 ENCOUNTER — Ambulatory Visit (INDEPENDENT_AMBULATORY_CARE_PROVIDER_SITE_OTHER): Payer: Medicare Other | Admitting: Internal Medicine

## 2017-04-23 VITALS — BP 112/70 | HR 67 | Ht 66.0 in | Wt 146.2 lb

## 2017-04-23 DIAGNOSIS — I480 Paroxysmal atrial fibrillation: Secondary | ICD-10-CM

## 2017-04-23 DIAGNOSIS — I10 Essential (primary) hypertension: Secondary | ICD-10-CM

## 2017-04-23 DIAGNOSIS — R0989 Other specified symptoms and signs involving the circulatory and respiratory systems: Secondary | ICD-10-CM | POA: Diagnosis not present

## 2017-04-23 NOTE — Patient Instructions (Addendum)
Medication Instructions:  Your physician recommends that you continue on your current medications as directed. Please refer to the Current Medication list given to you today.  Labwork: None ordered.  Testing/Procedures: Your physician has requested that you have a carotid duplex. This test is an ultrasound of the carotid arteries in your neck. It looks at blood flow through these arteries that supply the brain with blood. Allow one hour for this exam. There are no restrictions or special instructions.  Please schedule for bilateral carotid ultrasound.  Follow-Up: Your physician wants you to follow-up in: 6 months with Dr. Lovena Le.   You will receive a reminder letter in the mail two months in advance. If you don't receive a letter, please call our office to schedule the follow-up appointment.  Any Other Special Instructions Will Be Listed Below (If Applicable).  If you need a refill on your cardiac medications before your next appointment, please call your pharmacy.

## 2017-04-23 NOTE — Progress Notes (Signed)
HPI Laura Li is referred today for evaluation of PAF, remote stroke, now on systemic anti-coagulation. The patient is a pleasant 77 yo woman with normal LV function who has mostly done well since she was seen in our office several months ago. She denies chest pain, sob, or syncope. No peripheral edema. Allergies  Allergen Reactions  . Dyazide [Hydrochlorothiazide W-Triamterene]     Presumption: drug induced vasculitis  . Niacin Other (See Comments)    flushing  . Penicillins Diarrhea and Nausea And Vomiting     Current Outpatient Medications  Medication Sig Dispense Refill  . acetaminophen (TYLENOL) 500 MG tablet Take 500 mg by mouth 2 (two) times daily as needed for mild pain. Reported on 07/07/2015    . atorvastatin (LIPITOR) 10 MG tablet Take 1 tablet (10 mg total) by mouth daily. 90 tablet 3  . B Complex-C-E-Zn (B COMPLEX-C-E-ZINC) tablet Take 1 tablet by mouth daily.    . B Complex-C-Folic Acid (STRESS FORMULA PO) Take 1 tablet by mouth every morning.     . Calcium-Vitamin D (CALTRATE 600 PLUS-VIT D PO) Take 1 tablet by mouth 2 (two) times daily.     . carboxymethylcellulose (REFRESH PLUS) 0.5 % SOLN 2 drops 3 (three) times daily as needed.    . ciclopirox (PENLAC) 8 % solution APPLY ON THE AFFECTED NAILS BID UTD  5  . esomeprazole (NEXIUM) 40 MG capsule TAKE 1 BY MOUTH DAILY 90 capsule 2  . fexofenadine (ALLEGRA) 180 MG tablet Take 180 mg by mouth daily as needed for allergies.     [name] ipratropium (ATROVENT) 0.03 % nasal spray Place 2 sprays into the nose every 12 (twelve) hours. 180 mL 3  . Lactobacillus-Inulin (CULTURELLE DIGESTIVE HEALTH PO) Take 1 capsule by mouth daily.    [name] levothyroxine (SYNTHROID, LEVOTHROID) 88 MCG tablet Take 1 tablet (88 mcg total) by mouth daily. 90 tablet 3  . nadolol (CORGARD) 20 MG tablet TAKE 1/2 TABLET(10 MG) BY MOUTH DAILY 45 tablet 2  . Propylene Glycol (SYSTANE BALANCE) 0.6 % SOLN Apply 1-2 drops to eye daily as needed (dry eyes).       . rivaroxaban (XARELTO) 20 MG TABS tablet Take 1 tablet (20 mg total) by mouth daily with supper. 12 tablet 0  . venlafaxine XR (EFFEXOR-XR) 150 MG 24 hr capsule TAKE 1 CAPSULE(150 MG) BY MOUTH DAILY 90 capsule 2   No current facility-administered medications for this visit.      Past Medical History:  Diagnosis Date  . Anemia    from medicaions  . Arthritis   . Chronic kidney disease    some renal impairment from meds  . Depression    states no depression  . Eczema   . Esophageal stricture   . GERD (gastroesophageal reflux disease)   . Headache   . Heart murmur    states it is benign told 30-40 years ago  . History of DVT (deep vein thrombosis)   . HX: breast cancer    melanoma  . Hyperlipidemia   . Hypertension   . Hypothyroidism   . Melanoma ([place])   . OA (osteoarthritis)   . Personal history of chemotherapy 2016   Left Breast Cancer  . Personal history of radiation therapy 2016   Left Breast Cancer  . Pneumothorax on right 06/15/2014  . PONV (postoperative nausea and vomiting)   . Wears glasses     ROS:   All systems reviewed and negative except as noted in the  HPI.   Past Surgical History:  Procedure Laterality Date  . APPENDECTOMY  1967  . BREAST EXCISIONAL BIOPSY Left   . BREAST LUMPECTOMY Left 2016  . BREAST SURGERY  1977   removal of calcified milk gland right breast  . CHEST TUBE INSERTION  06/15/2014  . CHEST TUBE INSERTION Bilateral 06/15/2014   Procedure: CHEST TUBE INSERTION;  Surgeon: Rolm Bookbinder, MD;  Location: [facility];  Service: General;  Laterality: Bilateral;  . COLONOSCOPY    . [place]   right breast  . MASTECTOMY Right 1998   right-nodes out  . MELANOMA EXCISION  2004   right side of face  . MOHS SURGERY  2005   right left-basal cell  . PORT-A-CATH REMOVAL N/A 10/05/2014   Procedure: REMOVAL PORT-A-CATH;  Surgeon: Rolm Bookbinder, MD;  Location: [facility];  Service: General;  Laterality: N/A;  .  PORTACATH PLACEMENT Left 06/15/2014   Procedure: INSERTION PORT-A-CATH;  Surgeon: Rolm Bookbinder, MD;  Location: [facility];  Service: General;  Laterality: Left;  . RADIOACTIVE SEED GUIDED PARTIAL MASTECTOMY WITH AXILLARY SENTINEL LYMPH NODE BIOPSY Left 05/05/2014   Procedure: RADIOACTIVE SEED GUIDED LEFT LUMPECTOMY WITH AXILLARY SENTINEL LYMPH NODE BIOPSY;  Surgeon: Rolm Bookbinder, MD;  Location: [facility];  Service: General;  Laterality: Left;  . TONSILLECTOMY AND ADENOIDECTOMY  1949  . TOTAL ABDOMINAL HYSTERECTOMY  1992   including ovaries     Family History  Problem Relation Age of Onset  . Heart disease Father 37       smoker  . Heart attack Father   . Endometrial cancer Mother 35  . Breast cancer Cousin        x 3 cousins with breast cancer (one at 4, one at 61 and one at 34)  . Lung cancer Paternal Aunt        smoker  . Brain cancer Maternal Aunt   . Diabetes Maternal Aunt        ???  . Breast cancer Maternal Aunt 51  . Healthy Sister   . Colon cancer Neg Hx      Social History   Socioeconomic History  . Marital status: Widowed    Spouse name: Not on file  . Number of children: Not on file  . Years of education: Not on file  . Highest education level: Not on file  Social Needs  . Financial resource strain: Not on file  . Food insecurity - worry: Not on file  . Food insecurity - inability: Not on file  . Transportation needs - medical: Not on file  . Transportation needs - non-medical: Not on file  Occupational History  . Occupation: retired  Tobacco Use  . Smoking status: Former Smoker    Packs/day: 0.75    Years: 3.00    Pack years: 2.25    Types: Cigarettes    Last attempt to quit: 03/11/1969    Years since quitting: 48.1  . Smokeless tobacco: Never Used  Substance and Sexual Activity  . Alcohol use: Yes    Alcohol/week: 0.0 oz    Comment: occasional wine  . Drug use: No  . Sexual activity: No    Birth control/protection: None  Other  Topics Concern  . Not on file  Social History Narrative   Widowed 2003. Moved to [place] from [place] in 2004 after loss of husband to be near daughter. 1 daughter. 2 grandkids.       Retired from working at a  [employer]      Hobbies: artist     BP 112/70   Pulse 67   Ht 5\' 6"  (1.676 m)   Wt 146 lb 3.2 oz (66.3 kg)   SpO2 99%   BMI 23.60 kg/m   Physical Exam:  Well appearing 77 yo woman, NAD HEENT: Unremarkable Neck:  6 cm JVD, no thyromegally Lymphatics:  No adenopathy Back:  No CVA tenderness Lungs:  Clear with no wheezes HEART:  Regular rate rhythm, no murmurs, no rubs, no clicks Abd:  soft, positive bowel sounds, no organomegally, no rebound, no guarding Ext:  2 plus pulses, no edema, no cyanosis, no clubbing Skin:  No rashes no nodules Neuro:  CN II through XII intact, motor grossly intact  EKG - nsr   Assess/Plan: 1. PAF - she appears to be maintaining NSR. She has been able to take her medications without difficulty. 2. coags - she is tolerating her systemic anti-coagulation. 3. DVT - she is doing well and has no peripheral edema.  4. HTN - her blood pressure is well controlled today. No change in meds.  Mikle Bosworth.D.

## 2017-04-24 ENCOUNTER — Encounter: Payer: Self-pay | Admitting: Family Medicine

## 2017-04-24 ENCOUNTER — Ambulatory Visit (INDEPENDENT_AMBULATORY_CARE_PROVIDER_SITE_OTHER): Payer: Medicare Other | Admitting: Family Medicine

## 2017-04-24 VITALS — BP 112/78 | HR 67 | Temp 97.5°F | Ht 66.0 in | Wt 146.0 lb

## 2017-04-24 DIAGNOSIS — I82591 Chronic embolism and thrombosis of other specified deep vein of right lower extremity: Secondary | ICD-10-CM

## 2017-04-24 DIAGNOSIS — R51 Headache: Secondary | ICD-10-CM | POA: Diagnosis not present

## 2017-04-24 DIAGNOSIS — R519 Headache, unspecified: Secondary | ICD-10-CM

## 2017-04-24 DIAGNOSIS — E039 Hypothyroidism, unspecified: Secondary | ICD-10-CM | POA: Diagnosis not present

## 2017-04-24 DIAGNOSIS — F324 Major depressive disorder, single episode, in partial remission: Secondary | ICD-10-CM | POA: Diagnosis not present

## 2017-04-24 DIAGNOSIS — R413 Other amnesia: Secondary | ICD-10-CM

## 2017-04-24 DIAGNOSIS — F325 Major depressive disorder, single episode, in full remission: Secondary | ICD-10-CM | POA: Insufficient documentation

## 2017-04-24 NOTE — Progress Notes (Signed)
Subjective:  Laura Li is a 77 y.o. year old very pleasant female patient who presents for/with See problem oriented charting ROS- No chest pain or shortness of breath. No headache or blurry vision.    Past Medical History-  Patient Active Problem List   Diagnosis Date Noted  . Memory loss 10/16/2016    Priority: High  . Paroxysmal atrial fibrillation ([place]) 06/04/2016    Priority: High  . Chronic deep vein thrombosis (DVT) (HCC) 09/01/2015    Priority: High  . Pneumothorax 06/15/2014    Priority: High  . History of left breast cancer 04/24/2014    Priority: High  . History of right breast cancer 04/24/2014    Priority: High  . Vasculitis ([place]) 11/09/2012    Priority: High  . Overactive bladder 09/19/2016    Priority: Medium  . Acute lower UTI 05/29/2016    Priority: Medium  . Osteopenia 05/23/2016    Priority: Medium  . Melanoma of skin ([place]) 03/31/2014    Priority: Medium  . Chronic kidney disease, stage III (moderate) ([place]) 12/09/2012    Priority: Medium  . 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    Priority: Medium  . Hypothyroidism 02/06/2006    Priority: Medium  . Hyperlipidemia 02/06/2006    Priority: Medium  . Essential hypertension 02/06/2006    Priority: Medium  . Localized swelling, mass, or lump of lower extremity 11/05/2014    Priority: Low  . Diverticulitis 08/09/2014    Priority: Low  . Family history of breast cancer 04/29/2014    Priority: Low  . GERD (gastroesophageal reflux disease) 03/31/2014    Priority: Low  . Hot flashes 03/31/2014    Priority: Low  . Osteoarthritis 03/31/2014    Priority: Low  . Allergic rhinitis 02/06/2006    Priority: Low  . Depression, major, single episode, in partial remission ([place]) 04/24/2017    Medications- reviewed and updated Current Outpatient Medications  Medication Sig Dispense Refill  . acetaminophen (TYLENOL) 500 MG tablet Take 500 mg by mouth 2 (two) times daily as needed for mild pain. Reported on  07/07/2015    . atorvastatin (LIPITOR) 10 MG tablet Take 1 tablet (10 mg total) by mouth daily. 90 tablet 3  . B Complex-C-E-Zn (B COMPLEX-C-E-ZINC) tablet Take 1 tablet by mouth daily.    . B Complex-C-Folic Acid (STRESS FORMULA PO) Take 1 tablet by mouth every morning.     . Calcium-Vitamin D (CALTRATE 600 PLUS-VIT D PO) Take 1 tablet by mouth 2 (two) times daily.     . carboxymethylcellulose (REFRESH PLUS) 0.5 % SOLN 2 drops 3 (three) times daily as needed.    [name] esomeprazole (NEXIUM) 40 MG capsule TAKE 1 BY MOUTH DAILY 90 capsule 2  . fexofenadine (ALLEGRA) 180 MG tablet Take 180 mg by mouth daily as needed for allergies.     [name] ipratropium (ATROVENT) 0.03 % nasal spray Place 2 sprays into the nose every 12 (twelve) hours. 180 mL 3  . Lactobacillus-Inulin (CULTURELLE DIGESTIVE HEALTH PO) Take 1 capsule by mouth daily.    [name] levothyroxine (SYNTHROID, LEVOTHROID) 88 MCG tablet Take 1 tablet (88 mcg total) by mouth daily. 90 tablet 3  . nadolol (CORGARD) 20 MG tablet TAKE 1/2 TABLET(10 MG) BY MOUTH DAILY 45 tablet 2  . Propylene Glycol (SYSTANE BALANCE) 0.6 % SOLN Apply 1-2 drops to eye daily as needed (dry eyes).     . rivaroxaban (XARELTO) 20 MG TABS tablet Take 1 tablet (20 mg total) by mouth daily with  supper. 12 tablet 0  . venlafaxine XR (EFFEXOR-XR) 150 MG 24 hr capsule TAKE 1 CAPSULE(150 MG) BY MOUTH DAILY 90 capsule 2   Objective: BP 112/78 (BP Location: Left Arm, Patient Position: Sitting, Cuff Size: Large)   Pulse 67   Temp (!) 97.5 F (36.4 C) (Oral)   Ht 5\' 6"  (1.676 m)   Wt 146 lb (66.2 kg)   SpO2 94%   BMI 23.57 kg/m  Gen: NAD, resting comfortably CV: RRR no murmurs rubs or gallops Lungs: CTAB no crackles, wheeze, rhonchi Abdomen: soft/nontender/nondistended/normal bowel sounds. Healthy weight Ext: no pretibial  edema Skin: warm, dry  Assessment/Plan:  Memory loss S: continues to have issues with memory issues. In august cored 26/30 on mmse at [facility] going on since at  least mid 2017. Initial MMSE was 28/30.   Reviewed MRI which was largely reassuring. She feels like she has had some improvement in memory since the MRI. Prior headache resolved.  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 she asks about referral to neurology- this is reasonable- refer today. Discussed possibly using aricept- she plans to discuss this further with neurology.   Hypothyroidism S: controlled on levothyroxine 88 mcg after dose increase last visit Lab Results  Component Value Date   TSH 0.88 12/02/2016  A/P: continue current rx   Intermittent headache Had MRI. Resolved since that time.   Chronic deep vein thrombosis (DVT) ([facility]) In right leg- pain improved with xarelto- do need to monitor GFR on this dose- will remain on xarelto 20mg  long term as long as GFR doesn't cause adjustment- both for atiral fibrillation and for chronic DVT>   Depression, major, single episode, in partial remission ([place]) S: on venlafaxine 150mg  XR. PHQ9 of 8 today. No si A/P: continue venlafaxine. I think her memory changes are discouraging her.  Gave patient handout for behavioral health and asked her to strongly consider. Recheck in 3 months- consider adjustments to medication  Future Appointments  Date Time Provider [place]  04/25/2017  3:30 PM [facility] VASC 2 [facility]  05/23/2017 10:00 AM Philemon Kingdom, MD [facility] None  06/24/2017 11:00 AM [facility] LAB 6 [facility] None  07/01/2017  2:00 PM Magrinat, Virgie Dad, MD [facility] None   Return in about 3 months (around 07/22/2017) for follow up- or sooner if needed.  Lab/Order associations: Memory loss - Plan: Ambulatory referral to Neurology  Hypothyroidism, unspecified type  Chronic deep vein thrombosis (DVT) of other vein of right lower extremity (HCC)  Depression, major, single episode, in partial remission ([facility])  Intermittent headache  No orders of the defined types were placed in  this encounter.   Return precautions advised.  [name], MD

## 2017-04-24 NOTE — Assessment & Plan Note (Addendum)
S: continues to have issues with memory issues. In august cored 26/30 on mmse at [facility] going on since at least mid 2017. Initial MMSE was 28/30.   Reviewed MRI which was largely reassuring. She feels like she has had some improvement in memory since the MRI. Prior headache resolved.  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 she asks about referral to neurology- this is reasonable- refer today. Discussed possibly using aricept- she plans to discuss this further with neurology.

## 2017-04-24 NOTE — Assessment & Plan Note (Addendum)
In right leg- pain improved with xarelto- do need to monitor GFR on this dose- will remain on xarelto 20mg  long term as long as GFR doesn't cause adjustment- both for atiral fibrillation and for chronic DVT>

## 2017-04-24 NOTE — Assessment & Plan Note (Signed)
S: on venlafaxine 150mg  XR. PHQ9 of 8 today. No si A/P: continue venlafaxine. I think her memory changes are discouraging her.  Gave patient handout for behavioral health and asked her to strongly consider. Recheck in 3 months- consider adjustments to medication

## 2017-04-24 NOTE — Patient Instructions (Addendum)
We will call you within a week or two about your referral to neurology. If you do not hear within 3 weeks, give [place] a call.  I would have your daughter go with you to this visit.   Please consider calling behavioral health- see me back in 3 months. I think the changes in your memory are discouraging you. We may need to change medicines at future visit

## 2017-04-24 NOTE — Assessment & Plan Note (Signed)
Had MRI. Resolved since that time.

## 2017-04-24 NOTE — Assessment & Plan Note (Signed)
S: controlled on levothyroxine 88 mcg after dose increase last visit Lab Results  Component Value Date   TSH 0.88 12/02/2016  A/P: continue current rx

## 2017-04-25 ENCOUNTER — Encounter: Payer: Self-pay | Admitting: Family Medicine

## 2017-04-25 ENCOUNTER — Ambulatory Visit (HOSPITAL_COMMUNITY)
Admission: RE | Admit: 2017-04-25 | Discharge: 2017-04-25 | Disposition: A | Discharge status: Home / Self Care | Payer: Medicare Other | Source: Ambulatory Visit | Attending: Cardiology | Admitting: Cardiology

## 2017-04-25 DIAGNOSIS — Z8673 Personal history of transient ischemic attack (TIA), and cerebral infarction without residual deficits: Secondary | ICD-10-CM | POA: Insufficient documentation

## 2017-04-25 DIAGNOSIS — R0989 Other specified symptoms and signs involving the circulatory and respiratory systems: Secondary | ICD-10-CM | POA: Diagnosis not present

## 2017-04-25 DIAGNOSIS — I10 Essential (primary) hypertension: Secondary | ICD-10-CM | POA: Diagnosis not present

## 2017-04-25 DIAGNOSIS — E785 Hyperlipidemia, unspecified: Secondary | ICD-10-CM | POA: Insufficient documentation

## 2017-04-28 NOTE — Telephone Encounter (Signed)
Referral has been updated, no further action needed.

## 2017-05-15 DIAGNOSIS — Z85820 Personal history of malignant melanoma of skin: Secondary | ICD-10-CM | POA: Diagnosis not present

## 2017-05-15 DIAGNOSIS — L821 Other seborrheic keratosis: Secondary | ICD-10-CM | POA: Diagnosis not present

## 2017-05-15 DIAGNOSIS — L218 Other seborrheic dermatitis: Secondary | ICD-10-CM | POA: Diagnosis not present

## 2017-05-15 DIAGNOSIS — L603 Nail dystrophy: Secondary | ICD-10-CM | POA: Diagnosis not present

## 2017-05-15 DIAGNOSIS — L814 Other melanin hyperpigmentation: Secondary | ICD-10-CM | POA: Diagnosis not present

## 2017-05-15 DIAGNOSIS — L820 Inflamed seborrheic keratosis: Secondary | ICD-10-CM | POA: Diagnosis not present

## 2017-05-23 ENCOUNTER — Ambulatory Visit (INDEPENDENT_AMBULATORY_CARE_PROVIDER_SITE_OTHER): Payer: Medicare Other | Admitting: Internal Medicine

## 2017-05-23 ENCOUNTER — Encounter: Payer: Self-pay | Admitting: Internal Medicine

## 2017-05-23 ENCOUNTER — Ambulatory Visit
Admission: RE | Admit: 2017-05-23 | Discharge: 2017-05-23 | Disposition: A | Discharge status: Home / Self Care | Payer: Medicare Other | Source: Ambulatory Visit | Attending: Internal Medicine | Admitting: Internal Medicine

## 2017-05-23 VITALS — BP 116/74 | HR 78 | Ht 66.0 in | Wt 146.8 lb

## 2017-05-23 DIAGNOSIS — M25562 Pain in left knee: Secondary | ICD-10-CM | POA: Diagnosis not present

## 2017-05-23 DIAGNOSIS — M949 Disorder of cartilage, unspecified: Secondary | ICD-10-CM

## 2017-05-23 DIAGNOSIS — M899 Disorder of bone, unspecified: Secondary | ICD-10-CM | POA: Diagnosis not present

## 2017-05-23 DIAGNOSIS — S8992XA Unspecified injury of left lower leg, initial encounter: Secondary | ICD-10-CM | POA: Diagnosis not present

## 2017-05-23 NOTE — Patient Instructions (Signed)
We will schedule a new DXA scan after 06/28/2017.  Let's plan to have labs right after this.  Stop downstairs for the knee X-ray.  Please come back for a follow-up appointment in 1 year.

## 2017-05-23 NOTE — Progress Notes (Addendum)
Patient ID: Laura Li, female   DOB: [birth year], 77 y.o.   MRN: [id number]    HPI  Laura Li is a 77 y.o.-year-old very pleasant woman,  returning for follow-up for  Osteopenia.  Last visit 1 year ago.  She fell 1 week ago (tripped on her shoe): hurt R elbow, shoulder, back and L knee. L patella is very painful to touch.  She is able to put weight on the leg.  Pt was dx with osteopenia in 2017.  I reviewed patient's DXA scan reports: Date L1-L4 T score FN T score FRAX  06/29/2015  L1-L3: -1.4 (-8.1%* from 2008)  RFN: -2.4 (-2.4% from 2008) LFN: -2.4 MOF: 24.5%  Hip fracture risk: 7.6%   09/26/2008  L2-L4: -1.4  RFN: -2.4 LFN: -2.2 MOF: 21.1%  Hip fracture risk: 7.3%   12/24/2001 0.0 RFN: -0.9 LFN: -1.5 On Femara at that time   Fractures: - L foot 4th digit phalanx -05/2015  No dizziness/orthostasis/poor vision. She has vertigo for 20 years.    Previous OP treatments:  - Fosamax for 3 months (2003) >> felt "terrible", sick, severe joint pain << improved on Tylenol  Latest vitamin D level reviewed: Lab Results  Component Value Date   VD25OH 46.74 05/23/2016   Pt is on calcium + vitamin D 600-400 decreased from twice a day to once a day at last visit.   She is doing weightbearing exercises, working with a Physiological scientist  She does not take high vitamin A doses.  Menopause was In her 74s, when she had a hysterectomy.   She does have a family history of osteoporosis in her sister  No hyper/hypocalcemia or hyperparathyroidism.  No history of kidney stones . Lab Results  Component Value Date   CALCIUM 10.4 10/16/2016   CALCIUM 9.9 06/28/2016   CALCIUM 9.9 06/04/2016   CALCIUM 9.7 05/31/2016   CALCIUM 8.9 05/29/2016   CALCIUM 9.6 05/28/2016   CALCIUM 9.9 04/30/2016   CALCIUM 10.3 12/26/2015   CALCIUM 9.5 11/28/2015   CALCIUM 10.4 06/26/2015   No thyrotoxicosis.  She has hypothyroidism and takes levothyroxine 88 mcg daily.  Reviewed recent TSH levels: Lab  Results  Component Value Date   TSH 0.88 12/02/2016   TSH 7.83 (H) 10/16/2016   TSH 5.304 (H) 05/30/2016   TSH 1.72 04/30/2016   TSH 2.44 11/28/2015   No CKD. Last BUN/Cr: Lab Results  Component Value Date   BUN 19 10/16/2016   CREATININE 0.95 10/16/2016   Pt has HL, HTN, hypothyroidism, short term memory loss. She had BrCA in 2000, second time 2016. She was on tamoxifen previously. Also, h/o melanoma.  ROS: Constitutional: no weight gain/no weight loss, + fatigue, no subjective hyperthermia, no subjective hypothermia, + nocturia Eyes: no blurry vision, no xerophthalmia ENT: no sore throat, no nodules palpated in throat, no dysphagia, no odynophagia, no hoarseness Cardiovascular: no CP/no SOB/no palpitations/no leg swelling Respiratory: no cough/no SOB/no wheezing Gastrointestinal: no N/no V/no D/no C/no acid reflux Musculoskeletal: no muscle aches/+ joint aches Skin: + rash-face, no hair loss Neurological: no tremors/no numbness/no tingling/no dizziness  I reviewed pt's medications, allergies, PMH, social hx, family hx, and changes were documented in the history of present illness. Otherwise, unchanged from my initial visit note.  Past Medical History:  Diagnosis Date  . Anemia    from medicaions  . Arthritis   . Chronic kidney disease    some renal impairment from meds  . Depression    states no depression  .  Eczema   . Esophageal stricture   . GERD (gastroesophageal reflux disease)   . Headache   . Heart murmur    states it is benign told 30-40 years ago  . History of DVT (deep vein thrombosis)   . HX: breast cancer    melanoma  . Hyperlipidemia   . Hypertension   . Hypothyroidism   . Melanoma ([place])   . OA (osteoarthritis)   . Personal history of chemotherapy 2016   Left Breast Cancer  . Personal history of radiation therapy 2016   Left Breast Cancer  . Pneumothorax on right 06/15/2014  . PONV (postoperative nausea and vomiting)   . Wears glasses    Past  Surgical History:  Procedure Laterality Date  . APPENDECTOMY  1967  . BREAST EXCISIONAL BIOPSY Left   . BREAST LUMPECTOMY Left 2016  . BREAST SURGERY  1977   removal of calcified milk gland right breast  . CHEST TUBE INSERTION  06/15/2014  . CHEST TUBE INSERTION Bilateral 06/15/2014   Procedure: CHEST TUBE INSERTION;  Surgeon: Rolm Bookbinder, MD;  Location: [facility];  Service: General;  Laterality: Bilateral;  . COLONOSCOPY    . [place]   right breast  . MASTECTOMY Right 1998   right-nodes out  . MELANOMA EXCISION  2004   right side of face  . MOHS SURGERY  2005   right left-basal cell  . PORT-A-CATH REMOVAL N/A 10/05/2014   Procedure: REMOVAL PORT-A-CATH;  Surgeon: Rolm Bookbinder, MD;  Location: [facility];  Service: General;  Laterality: N/A;  . PORTACATH PLACEMENT Left 06/15/2014   Procedure: INSERTION PORT-A-CATH;  Surgeon: Rolm Bookbinder, MD;  Location: [facility];  Service: General;  Laterality: Left;  . RADIOACTIVE SEED GUIDED PARTIAL MASTECTOMY WITH AXILLARY SENTINEL LYMPH NODE BIOPSY Left 05/05/2014   Procedure: RADIOACTIVE SEED GUIDED LEFT LUMPECTOMY WITH AXILLARY SENTINEL LYMPH NODE BIOPSY;  Surgeon: Rolm Bookbinder, MD;  Location: [facility];  Service: General;  Laterality: Left;  . TONSILLECTOMY AND ADENOIDECTOMY  1949  . TOTAL ABDOMINAL HYSTERECTOMY  1992   including ovaries   Social History   Social History  . Marital status: Widowed    Spouse name: N/A  . Number of children: 1   Occupational History  . retired    Social History Main Topics  . Smoking status: Former Smoker    Packs/day: 0.75    Years: 3.00    Types: Cigarettes    Quit date: 03/11/1969  . Smokeless tobacco: Never Used  . Alcohol use 0.0 oz/week     Comment: occasional wine  . Drug use: No   Social History Narrative   Widowed 2003. Moved to [place] from [place] in 2004 after loss of husband to be near daughter. 1 daughter. 2 grandkids.        Retired from working at a church      Hobbies: Training and development officer   Current Outpatient Medications on File Prior to Visit  Medication Sig Dispense Refill  . acetaminophen (TYLENOL) 500 MG tablet Take 500 mg by mouth 2 (two) times daily as needed for mild pain. Reported on 07/07/2015    . atorvastatin (LIPITOR) 10 MG tablet Take 1 tablet (10 mg total) by mouth daily. 90 tablet 3  . B Complex-C-E-Zn (B COMPLEX-C-E-ZINC) tablet Take 1 tablet by mouth daily.    . B Complex-C-Folic Acid (STRESS FORMULA PO) Take 1 tablet by mouth every morning.     . Calcium-Vitamin D (CALTRATE 600 PLUS-VIT D PO)  Take 1 tablet by mouth 2 (two) times daily.     . carboxymethylcellulose (REFRESH PLUS) 0.5 % SOLN 2 drops 3 (three) times daily as needed.    [name] esomeprazole (NEXIUM) 40 MG capsule TAKE 1 BY MOUTH DAILY 90 capsule 2  . fexofenadine (ALLEGRA) 180 MG tablet Take 180 mg by mouth daily as needed for allergies.     [name] ipratropium (ATROVENT) 0.03 % nasal spray Place 2 sprays into the nose every 12 (twelve) hours. 180 mL 3  . Lactobacillus-Inulin (CULTURELLE DIGESTIVE HEALTH PO) Take 1 capsule by mouth daily.    [name] levothyroxine (SYNTHROID, LEVOTHROID) 88 MCG tablet Take 1 tablet (88 mcg total) by mouth daily. 90 tablet 3  . nadolol (CORGARD) 20 MG tablet TAKE 1/2 TABLET(10 MG) BY MOUTH DAILY 45 tablet 2  . Propylene Glycol (SYSTANE BALANCE) 0.6 % SOLN Apply 1-2 drops to eye daily as needed (dry eyes).     . rivaroxaban (XARELTO) 20 MG TABS tablet Take 1 tablet (20 mg total) by mouth daily with supper. 12 tablet 0  . venlafaxine XR (EFFEXOR-XR) 150 MG 24 hr capsule TAKE 1 CAPSULE(150 MG) BY MOUTH DAILY 90 capsule 2   No current facility-administered medications on file prior to visit.    Allergies  Allergen Reactions  . Dyazide [Hydrochlorothiazide W-Triamterene]     Presumption: drug induced vasculitis  . Niacin Other (See Comments)    flushing  . Penicillins Diarrhea and Nausea And Vomiting   Family History  Problem  Relation Age of Onset  . Heart disease Father 36       smoker  . Heart attack Father   . Endometrial cancer Mother 73  . Breast cancer Cousin        x 3 cousins with breast cancer (one at 76, one at 39 and one at 67)  . Lung cancer Paternal Aunt        smoker  . Brain cancer Maternal Aunt   . Diabetes Maternal Aunt        ???  . Breast cancer Maternal Aunt 51  . Healthy Sister   . Colon cancer Neg Hx     PE: BP 116/74   Pulse 78   Ht _0  (1.676 m)   Wt 146 lb 12.8 oz (66.6 kg)   SpO2 98%   BMI 23.69 kg/m   Wt Readings from Last 3 Encounters:  05/23/17 146 lb 12.8 oz (66.6 kg)  04/24/17 146 lb (66.2 kg)  04/23/17 146 lb 3.2 oz (66.3 kg)   Constitutional: Normal weight, in NAD, + kyphosis Eyes: PERRLA, EOMI, no exophthalmos ENT: moist mucous membranes, no thyromegaly, no cervical lymphadenopathy Cardiovascular: RRR, No MRG Respiratory: CTA B Gastrointestinal: abdomen soft, NT, ND, BS+ Musculoskeletal: no deformities, strength intact in all 4, pain at palpation of patella, but no swelling Skin: moist, warm, no rashes Neurological: no tremor with outstretched hands, DTR normal in all 4  Assessment: 1. Osteopenia  2. S/p fall - pain in patella  Plan: 1. Osteopenia - Likely age-related, postmenopausal, but could also be familial, as her sister has osteoporosis - We reviewed again her latest DXA scans from 2010-2018. She is at high risk for fracture, however, her calculated fracture scores are high, but basically unchanged from 2010 on.  We discussed at last visit to wait until the next DEXA scan this year to decide whether to start medication or not. - She is due for another bone density scan after 07/05/2017; we will order this today. -  We again discussed about the different medication classes, benefits, and side effects, including atypical fractures and ON J.  She has no dental workup in progress or planned. - If we do decide to start treatment, since she has  intolerance to Fosamax, I would suggest against oral bisphosphonates, and my first choice would be Prolia for 3-6 years, then zoledronic acid for 1-2 years.  I would not suggest teriparatide for now.  We can use this as a last resort.  She agrees. - We reviewed her dietary and supplemental calcium and vitamin D intake.  She is currently taking a vit D supplement daily.  She takes a calcium supplement once a day.  We reduced this from twice a day at last visit.  We will need to recheck another level, but will do this in 06/2017 along with a BMP, before her bone density scan, in case we need to start therapy for her osteopenia. - She is not smoking or drinking more than 2 drinks of alcohol a day - She is doing weight bearing exercises - will see pt back in 1 year  2. S/p fall - pain in patella - Possible patellar fracture - We will obtain an x-ray - Advised to apply ice   Orders Placed This Encounter  Procedures  . DG Bone Density  . BASIC METABOLIC PANEL WITH GFR  . VITAMIN D 25 Hydroxy (Vit-D Deficiency, Fractures)  . CHG X-RAY KNEE 3 VIEW   DG Knee Complete 4 Views Left CLINICAL DATA:  77 year old female status post fall 1 week ago. Patella pain.  EXAM: LEFT KNEE - COMPLETE 4+ VIEW  COMPARISON:  Left knee series 02/14/2005.  FINDINGS: Mild osteophytosis along the undersurface of the patella since 2006. Questionable small suprapatellar joint effusion. The patella is intact. Joint spaces are normal for age. Mild medial compartment osteophytosis has not significantly changed. No acute osseous abnormality identified.  IMPRESSION: Questionable small joint effusion. Mild patellofemoral compartment degeneration since 2006. No acute osseous abnormality identified.  Electronically Signed   By: Genevie Ann M.D.   On: 05/23/2017 14:08  Msg sent: Dear Ms Santo Held, The x-ray of your knee shows that there is no fracture, but just a little fluid in the joint.  Please start applying ice for  20 minutes twice a day, also, rest as much as possible and keep the knee elevated. Sincerely, Philemon Kingdom MD  06/30/2017 Lumbar spine L1-L4 Femoral neck (FN)  T-score -1.2 RFN: -2.3 LFN: -2.5  Change in BMD from previous DXA test (%)  -6.7%*  -0.4%  (*) statistically significant  Bone density appears slightly better at the level of the spine and stable at the hips.  Philemon Kingdom, MD PhD [facility] Endocrinology

## 2017-06-05 DIAGNOSIS — H04123 Dry eye syndrome of bilateral lacrimal glands: Secondary | ICD-10-CM | POA: Diagnosis not present

## 2017-06-23 ENCOUNTER — Other Ambulatory Visit: Payer: Self-pay | Admitting: *Deleted

## 2017-06-23 DIAGNOSIS — C50512 Malignant neoplasm of lower-outer quadrant of left female breast: Secondary | ICD-10-CM

## 2017-06-23 DIAGNOSIS — Z171 Estrogen receptor negative status [ER-]: Principal | ICD-10-CM

## 2017-06-24 ENCOUNTER — Inpatient Hospital Stay: Payer: Medicare Other | Attending: Oncology

## 2017-06-24 DIAGNOSIS — Z853 Personal history of malignant neoplasm of breast: Secondary | ICD-10-CM | POA: Diagnosis not present

## 2017-06-24 DIAGNOSIS — Z171 Estrogen receptor negative status [ER-]: Secondary | ICD-10-CM

## 2017-06-24 DIAGNOSIS — C50512 Malignant neoplasm of lower-outer quadrant of left female breast: Secondary | ICD-10-CM

## 2017-06-24 LAB — CBC WITH DIFFERENTIAL (CANCER CENTER ONLY)
BASOS ABS: 0 10*3/uL (ref 0.0–0.1)
BASOS PCT: 1 %
EOS ABS: 0.1 10*3/uL (ref 0.0–0.5)
Eosinophils Relative: 3 %
HEMATOCRIT: 38.4 % (ref 34.8–46.6)
Hemoglobin: 13.1 g/dL (ref 11.6–15.9)
Lymphocytes Relative: 24 %
Lymphs Abs: 1.2 10*3/uL (ref 0.9–3.3)
MCH: 31 pg (ref 25.1–34.0)
MCHC: 34.2 g/dL (ref 31.5–36.0)
MCV: 90.5 fL (ref 79.5–101.0)
MONO ABS: 0.4 10*3/uL (ref 0.1–0.9)
Monocytes Relative: 9 %
NEUTROS ABS: 3.2 10*3/uL (ref 1.5–6.5)
Neutrophils Relative %: 63 %
Platelet Count: 217 10*3/uL (ref 145–400)
RBC: 4.24 MIL/uL (ref 3.70–5.45)
RDW: 13.1 % (ref 11.2–14.5)
WBC Count: 4.9 10*3/uL (ref 3.9–10.3)

## 2017-06-24 LAB — CMP (CANCER CENTER ONLY)
ALBUMIN: 4.5 g/dL (ref 3.5–5.0)
ALK PHOS: 87 U/L (ref 40–150)
ALT: 21 U/L (ref 0–55)
ANION GAP: 8 (ref 3–11)
AST: 24 U/L (ref 5–34)
BILIRUBIN TOTAL: 0.4 mg/dL (ref 0.2–1.2)
BUN: 21 mg/dL (ref 7–26)
CALCIUM: 10.6 mg/dL — AB (ref 8.4–10.4)
CO2: 29 mmol/L (ref 22–29)
Chloride: 104 mmol/L (ref 98–109)
Creatinine: 1.05 mg/dL (ref 0.60–1.10)
GFR, EST AFRICAN AMERICAN: 57 mL/min — AB (ref >60)
GFR, Estimated: 50 mL/min — ABNORMAL LOW (ref >60)
GLUCOSE: 80 mg/dL (ref 70–140)
Potassium: 4.5 mmol/L (ref 3.5–5.1)
Sodium: 141 mmol/L (ref 136–145)
TOTAL PROTEIN: 8.1 g/dL (ref 6.4–8.3)

## 2017-06-30 ENCOUNTER — Ambulatory Visit (INDEPENDENT_AMBULATORY_CARE_PROVIDER_SITE_OTHER)
Admission: RE | Admit: 2017-06-30 | Discharge: 2017-06-30 | Disposition: A | Discharge status: Home / Self Care | Payer: Medicare Other | Source: Ambulatory Visit | Attending: Internal Medicine | Admitting: Internal Medicine

## 2017-06-30 DIAGNOSIS — M949 Disorder of cartilage, unspecified: Secondary | ICD-10-CM

## 2017-06-30 DIAGNOSIS — M899 Disorder of bone, unspecified: Secondary | ICD-10-CM | POA: Diagnosis not present

## 2017-07-01 ENCOUNTER — Telehealth: Payer: Self-pay | Admitting: Oncology

## 2017-07-01 ENCOUNTER — Inpatient Hospital Stay (HOSPITAL_BASED_OUTPATIENT_CLINIC_OR_DEPARTMENT_OTHER): Payer: Medicare Other | Admitting: Oncology

## 2017-07-01 VITALS — BP 112/95 | HR 83 | Temp 98.5°F | Resp 18 | Ht 66.0 in | Wt 148.3 lb

## 2017-07-01 DIAGNOSIS — Z853 Personal history of malignant neoplasm of breast: Secondary | ICD-10-CM | POA: Diagnosis not present

## 2017-07-01 NOTE — Telephone Encounter (Signed)
Tried to call regarding 4/23 also mailed letter

## 2017-07-01 NOTE — Progress Notes (Signed)
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 Non-of the 25 lymph nodes removed from the right axilla were involved. The tumor was estrogen and progesterone receptor positive, HER-2 not amplified. She was treated adjuvantly with doxorubicin and cyclophosphamide 4, then received 5 years of antiestrogen therapy (initially tamoxifen, later letrozole). There has been no evidence of disease recurrence on the right.  More recently, Laura Li had routine left screening [facility]. This showed a suspicious area of architectural distortion in the inferior portion of the left breast. On 24/26/8341 [place] underwent left diagnostic mammography with ultrasonography. Spot compression views confirmed the presence 7 area of distortion in the lower central left breast, which was not palpable by physical exam (there was a prior biopsy scar in the upper outer quadrant of the left breast). Ultrasound confirmed a hypoechoic spiculated mass at the 6:00 location in the left breast, measuring maximally 1.3 cm. Sonography of the left axilla was  negative.  Biopsy of the left breast mass in question 120 10/29/2014 showed ([id number]) an invasive breast cancer with squamous features. It was estrogen and progesterone receptor negative. There was no HER-2 amplification, the signals ratio being 0.82 and the number per cell 1.80. The MIB-1 was 10%.  The patient has met with Dr. Donne Li in surgery and Dr. Pablo Li in radiation oncology. The patient understands there is no survival difference between lumpectomy and radiation compared with mastectomy. The patient will need sentinel lymph node sampling with either of those procedures. If she does have a lumpectomy, she will benefit from adjuvant radiation. The patient has been set up for genetics testing and this may affect her ultimate surgical decision.  Her subsequent history is as detailed below  INTERVAL HISTORY: Laura Li returns today for follow-up of her estrogen receptor negative breast cancer.  She continues on observation alone.  She tells me she was looking at friend's homes as a possible place to move, tripped in the hallway and fell.  Despite being on rivaroxaban, she did not have any bruising or bleeding.  Since her last visit here she had unilateral left diagnostic mammography, 04/10/2017, showing the breast density to be category C.  There was no evidence of malignancy.  REVIEW OF SYSTEMS: She is moving to [place] after having sold her house earlier this month.  She is doing a lot of packing and moving boxes and such, but otherwise is not exercising.  She tells me she finds it difficult sometimes to recall words for instance [employer].  She is not doing any painting currently.  She is not exercising regularly.  A detailed review of systems today was otherwise negative  PAST MEDICAL HISTORY: Past Medical History:  Diagnosis Date  . Anemia    from medicaions  .  Arthritis   . Chronic kidney disease    some renal impairment from meds  . Depression    states no depression    . Eczema   . Esophageal stricture   . GERD (gastroesophageal reflux disease)   . Headache   . Heart murmur    states it is benign told 30-40 years ago  . History of DVT (deep vein thrombosis)   . HX: breast cancer    melanoma  . Hyperlipidemia   . Hypertension   . Hypothyroidism   . Melanoma ([place])   . OA (osteoarthritis)   . Personal history of chemotherapy 2016   Left Breast Cancer  . Personal history of radiation therapy 2016   Left Breast Cancer  . Pneumothorax on right 06/15/2014  . PONV (postoperative nausea and vomiting)   . Wears glasses     PAST SURGICAL HISTORY: Past Surgical History:  Procedure Laterality Date  . APPENDECTOMY  1967  . BREAST EXCISIONAL BIOPSY Left   . BREAST LUMPECTOMY Left 2016  . BREAST SURGERY  1977   removal of calcified milk gland right breast  . CHEST TUBE INSERTION  06/15/2014  . CHEST TUBE INSERTION Bilateral 06/15/2014   Procedure: CHEST TUBE INSERTION;  Surgeon: Laura Bookbinder, Li;  Location: [facility];  Service: General;  Laterality: Bilateral;  . COLONOSCOPY    . [place]   right breast  . MASTECTOMY Right 1998   right-nodes out  . MELANOMA EXCISION  2004   right side of face  . MOHS SURGERY  2005   right left-basal cell  . PORT-A-CATH REMOVAL N/A 10/05/2014   Procedure: REMOVAL PORT-A-CATH;  Surgeon: Laura Bookbinder, Li;  Location: [facility];  Service: General;  Laterality: N/A;  . PORTACATH PLACEMENT Left 06/15/2014   Procedure: INSERTION PORT-A-CATH;  Surgeon: Laura Bookbinder, Li;  Location: [facility];  Service: General;  Laterality: Left;  . RADIOACTIVE SEED GUIDED PARTIAL MASTECTOMY WITH AXILLARY SENTINEL LYMPH NODE BIOPSY Left 05/05/2014   Procedure: RADIOACTIVE SEED GUIDED LEFT LUMPECTOMY WITH AXILLARY SENTINEL LYMPH NODE BIOPSY;  Surgeon: Laura Bookbinder, Li;  Location: [facility];  Service: General;  Laterality: Left;  . TONSILLECTOMY AND ADENOIDECTOMY  1949  . TOTAL ABDOMINAL  HYSTERECTOMY  1992   including ovaries    FAMILY HISTORY Family History  Problem Relation Age of Onset  . Heart disease Father 5       smoker  . Heart attack Father   . Endometrial cancer Mother 8  . Breast cancer Cousin        x 3 cousins with breast cancer (one at 63, one at 5 and one at 56)  . Lung cancer Paternal Aunt        smoker  . Brain cancer Maternal Aunt   . Diabetes Maternal Aunt        ???  . Breast cancer Maternal Aunt 51  . Healthy Sister   . Colon cancer Neg Hx   The patient's father died at the age of 65 from a myocardial infarction. The patient's mother died at the age of 79 from metastatic endometrial cancer which had been diagnosed 2 years before. The patient had no brothers, one sister. The patient's mother's sister was diagnosed with breast cancer but the patient does not know at what age. A second maternal aunt was diagnosed with brain cancer. The patient has 3 cousins on her mother's side diagnosed with breast cancer, one of them under the age of 78.  On the father's side there is a history of lung and cervical cancer  GYNECOLOGIC HISTORY:  No LMP recorded. Patient has had a hysterectomy. Menarche age 14, first live birth age 36. The patient is GX P1. She underwent hysterectomy with bilateral salpingo-oophorectomy in 1991. She used hormone replacement for approximately 7 years until 1998, when she had her right-sided breast cancer. She used oral contraceptives for approximately one year with no complications. 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 She is widowed and lives alone with no pets. Her main hobby is painting. Her daughter Faith Rogue lives in [place] and is also a homemaker. The patient has 2 grandchildren. She attends [employer]. The patient's  daughter [name] is her healthcare power of attorney. [name] can be reached at [phone]   HEALTH MAINTENANCE: Social History   Tobacco Use  . Smoking status: Former Smoker    Packs/day: 0.75    Years: 3.00    Pack years: 2.25    Types: Cigarettes    Last attempt to quit: 03/11/1969    Years since quitting: 48.3  . Smokeless tobacco: Never Used  Substance Use Topics  . Alcohol use: Yes    Alcohol/week: 0.0 oz    Comment: occasional wine  . Drug use: No     Colonoscopy: 20/10/[facility]  POE:UMPNTI post hysterectomy  Bone density:2014/osteopenia  Lipid panel:  Allergies  Allergen Reactions  . Dyazide [Hydrochlorothiazide W-Triamterene]     Presumption: drug induced vasculitis  . Niacin Other (See Comments)    flushing  . Penicillins Diarrhea and Nausea And Vomiting    Current Outpatient Medications  Medication Sig Dispense Refill  . acetaminophen (TYLENOL) 500 MG tablet Take 500 mg by mouth 2 (two) times daily as needed for mild pain. Reported on 07/07/2015    . atorvastatin (LIPITOR) 10 MG tablet Take 1 tablet (10 mg total) by mouth daily. 90 tablet 3  . B Complex-C-E-Zn (B COMPLEX-C-E-ZINC) tablet Take 1 tablet by mouth daily.    . B Complex-C-Folic Acid (STRESS FORMULA PO) Take 1 tablet by mouth every morning.     . Calcium-Vitamin D (CALTRATE 600 PLUS-VIT D PO) Take 1 tablet by mouth 2 (two) times daily.     . carboxymethylcellulose (REFRESH PLUS) 0.5 % SOLN 2 drops 3 (three) times daily as needed.    [name] esomeprazole (NEXIUM) 40 MG capsule TAKE 1 BY MOUTH DAILY 90 capsule 2  . fexofenadine (ALLEGRA) 180 MG tablet Take 180 mg by mouth daily as needed for allergies.     [name] ipratropium (ATROVENT) 0.03 % nasal spray Place 2 sprays into the nose every 12 (twelve) hours. 180 mL 3  . Lactobacillus-Inulin (CULTURELLE DIGESTIVE HEALTH PO) Take 1 capsule by mouth daily.    [name] levothyroxine (SYNTHROID, LEVOTHROID) 88 MCG tablet Take 1 tablet (88 mcg total) by mouth daily. 90 tablet 3    . Multiple Vitamins-Minerals (CENTRUM SILVER PO) Take 1 tablet by mouth daily.    . nadolol (CORGARD) 20 MG tablet TAKE 1/2 TABLET(10 MG) BY MOUTH DAILY 45 tablet 2  . Propylene Glycol (SYSTANE BALANCE) 0.6 % SOLN Apply 1-2 drops to eye daily as needed (dry eyes).     . rivaroxaban (XARELTO) 20 MG TABS tablet Take 1 tablet (20 mg total) by mouth daily with supper.  12 tablet 0  . venlafaxine XR (EFFEXOR-XR) 150 MG 24 hr capsule TAKE 1 CAPSULE(150 MG) BY MOUTH DAILY 90 capsule 2   No current facility-administered medications for this visit.     OBJECTIVE: Older white womanIn who appears well Vitals:   07/01/17 1336  BP: (!) 112/95  Pulse: 83  Resp: 18  Temp: 98.5 F (36.9 C)  SpO2: 98%     Body mass index is 23.94 kg/m.    ECOG FS:0 - Asymptomatic  Sclerae unicteric, EOMs intact Oropharynx clear and moist No cervical or supraclavicular adenopathy Lungs no rales or rhonchi Heart regular rate and rhythm Abd soft, nontender, positive bowel sounds MSK no focal spinal tenderness, no upper extremity lymphedema Neuro: nonfocal, well oriented, pleasant and cooperative affect Mini-Mental: Elephant, red, ball---remembered all three Breasts: On the right the patient has undergone mastectomy.  There is no evidence of chest wall recurrence.  On the left she has undergone lumpectomy and radiation, with no evidence of local recurrence.  Both axillae are benign.  LAB RESULTS:  CMP     Component Value Date/Time   NA 141 06/24/2017 1108   NA 141 06/28/2016 1118   K 4.5 06/24/2017 1108   K 4.4 06/28/2016 1118   CL 104 06/24/2017 1108   CO2 29 06/24/2017 1108   CO2 26 06/28/2016 1118   GLUCOSE 80 06/24/2017 1108   GLUCOSE 106 06/28/2016 1118   GLUCOSE 88 02/06/2006 1059   BUN 21 06/24/2017 1108   BUN 16.2 06/28/2016 1118   CREATININE 1.05 06/24/2017 1108   CREATININE 1.0 06/28/2016 1118   CALCIUM 10.6 (H) 06/24/2017 1108   CALCIUM 9.9 06/28/2016 1118   PROT 8.1 06/24/2017 1108    PROT 7.6 06/28/2016 1118   ALBUMIN 4.5 06/24/2017 1108   ALBUMIN 4.2 06/28/2016 1118   AST 24 06/24/2017 1108   AST 21 06/28/2016 1118   ALT 21 06/24/2017 1108   ALT 17 06/28/2016 1118   ALKPHOS 87 06/24/2017 1108   ALKPHOS 66 06/28/2016 1118   BILITOT 0.4 06/24/2017 1108   BILITOT 0.47 06/28/2016 1118   GFRNONAA 50 (L) 06/24/2017 1108   GFRAA 57 (L) 06/24/2017 1108    INo results found for: SPEP, UPEP  Lab Results  Component Value Date   WBC 4.9 06/24/2017   NEUTROABS 3.2 06/24/2017   HGB 13.1 06/24/2017   HCT 38.4 06/24/2017   MCV 90.5 06/24/2017   PLT 217 06/24/2017      Chemistry      Component Value Date/Time   NA 141 06/24/2017 1108   NA 141 06/28/2016 1118   K 4.5 06/24/2017 1108   K 4.4 06/28/2016 1118   CL 104 06/24/2017 1108   CO2 29 06/24/2017 1108   CO2 26 06/28/2016 1118   BUN 21 06/24/2017 1108   BUN 16.2 06/28/2016 1118   CREATININE 1.05 06/24/2017 1108   CREATININE 1.0 06/28/2016 1118      Component Value Date/Time   CALCIUM 10.6 (H) 06/24/2017 1108   CALCIUM 9.9 06/28/2016 1118   ALKPHOS 87 06/24/2017 1108   ALKPHOS 66 06/28/2016 1118   AST 24 06/24/2017 1108   AST 21 06/28/2016 1118   ALT 21 06/24/2017 1108   ALT 17 06/28/2016 1118   BILITOT 0.4 06/24/2017 1108   BILITOT 0.47 06/28/2016 1118       No results found for: LABCA2  No components found for: LABCA125  No results for input(s): INR in the last 168 hours.  Urinalysis  Component Value Date/Time   COLORURINE YELLOW 05/28/2016 2212   APPEARANCEUR CLEAR 05/28/2016 2212   LABSPEC 1.014 05/28/2016 2212   LABSPEC 1.005 08/09/2014 1052   PHURINE 7.0 05/28/2016 2212   GLUCOSEU NEGATIVE 05/28/2016 2212   GLUCOSEU Negative 08/09/2014 1052   HGBUR NEGATIVE 05/28/2016 2212   BILIRUBINUR n 08/29/2016 1124   BILIRUBINUR Negative 08/09/2014 1052   KETONESUR NEGATIVE 05/28/2016 2212   PROTEINUR n 08/29/2016 1124   PROTEINUR NEGATIVE 05/28/2016 2212   UROBILINOGEN 0.2 08/29/2016  1124   UROBILINOGEN 0.2 08/09/2014 1052   NITRITE n 08/29/2016 1124   NITRITE NEGATIVE 05/28/2016 2212   LEUKOCYTESUR Moderate (2+) (A) 08/29/2016 1124   LEUKOCYTESUR Negative 08/09/2014 1052    STUDIES: Repeat mammography on the right due January 2020  ASSESSMENT: 77 y.o. [place] woman  (1) status post right modified radical mastectomy November 1998 for a pT2 pN0, stage IIA invasive ductal carcinoma, grade 2, estrogen and progesterone receptor positive, HER-2 not amplified  (a) adjuvant chemotherapy consisted of doxorubicin and cyclophosphamide 4  (b) adjuvant anti-estrogens consisted of tamoxifen, then letrozole, for a total of 5 years  (2) status post left breast lower outer quadrant biopsy 04/07/2014 for a clinical T1c N0,stage IA  invasive ductal carcinoma, with squamous features, estrogen and progesterone receptor negative, HER-2 not amplified, with an MIB-1 of 10%   (3) left lumpectomy and sentinel lymph node biopsy 05/05/2014 showed aT1c pN0, stage IA invasive ductal carcinoma, grade 1, triple negative, with close but negative margins.   (4) Mammaprint classifies the tumor as basal like, high risk, and predicts a 30% chance of recurrence within 10 years with local treatment only.   (5) start of treatment delayed because of bilateral pneumothoraces after port placement. Adjuvant chemotherapy fiinally started 07/11/2014 consisting of carboplatin and docetaxel given every 21 days 4, completed 09/13/2014  (6) adjuvant radiation 10/31/2014-12/15/2014:  Left breast/ 45 Gy at 1.8 Gy per fraction x 25 fractions.  Left breast boost/ 16 Gy at 2 Gy per fraction x 8 fractions  (7) genetics testing March 2016 showed no deleterious mutation in the [employer] panel  [[facility]] including sequencing and rearrangement analysis for the following 24 genes:ATM, BARD1, BRCA1, BRCA2, BRIP1, CDH1, CHEK2, EPCAM, MLH1, MRE11A, MSH2, MSH6, MUTYH, NBN, NF1, PALB2, PMS2, PTEN, RAD50, RAD51C, RAD51D,  SMARCA4, STK11, and TP53.   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   (8) osteopenia by bone density 06/29/2015 with [place] T score of -2.4  (a) intolerant of Fosamax   PLAN: Sameera is now a little over 3 years out from definitive surgery for her left-sided breast cancer with no evidence of disease recurrence.  This is very favorable.  She is concerned about dementia.  I gave her 3 words at the beginning of today's visit and asked her to repeat them at the end and she was able to do it flawlessly.  She occasionally has trouble finding words, finding her keys, and so on.  I really do not think she has significant dementia but in any case she is going to be evaluated by Dr. Brett Fairy and I think that is a good idea.  As far as breast cancer is concerned he will see me again in 1 year.  She knows to call for any issues that may develop before that visit.     Chauncey Cruel, Li   07/01/2017 2:05 PM

## 2017-07-07 ENCOUNTER — Other Ambulatory Visit: Payer: Self-pay | Admitting: Family Medicine

## 2017-07-14 ENCOUNTER — Encounter: Payer: Self-pay | Admitting: Family Medicine

## 2017-07-14 ENCOUNTER — Other Ambulatory Visit: Payer: Self-pay

## 2017-07-14 MED ORDER — NADOLOL 20 MG PO TABS: ORAL_TABLET | ORAL | 2 refills | Status: DC

## 2017-07-21 ENCOUNTER — Ambulatory Visit (INDEPENDENT_AMBULATORY_CARE_PROVIDER_SITE_OTHER): Payer: Medicare Other | Admitting: Neurology

## 2017-07-21 ENCOUNTER — Encounter: Payer: Self-pay | Admitting: Neurology

## 2017-07-21 VITALS — BP 127/69 | HR 70 | Ht 66.0 in | Wt 148.0 lb

## 2017-07-21 DIAGNOSIS — R4789 Other speech disturbances: Secondary | ICD-10-CM | POA: Diagnosis not present

## 2017-07-21 DIAGNOSIS — Z853 Personal history of malignant neoplasm of breast: Secondary | ICD-10-CM

## 2017-07-21 DIAGNOSIS — G3184 Mild cognitive impairment, so stated: Secondary | ICD-10-CM | POA: Diagnosis not present

## 2017-07-21 NOTE — Patient Instructions (Signed)
Mild Neurocognitive Disorder Mild neurocognitive disorder (formerly known as mild cognitive impairment) is a mental disorder. It is a slight abnormal decrease in mental function. The areas of mental function affected may include memory, thought, communication, behavior, and completion of tasks. The decrease is noticeable and measurable but for the most part does not interfere with your daily activities. Mild neurocognitive disorder typically occurs in people older than 60 years but can occur earlier. It is not as serious as major neurocognitive disorder (formerly known as dementia) but may lead to a more serious neurocognitive disorder. However, in some cases the condition does not get worse. A few people with this disorder even improve. What are the causes? There are a number of different causes of mild neurocognitive disorder:  Brain disorders associated with abnormal protein deposits, such as Alzheimer's disease, Pick's disease, and Lewy body disease.  Brain disorders associated with abnormal movement, such as Parkinson's disease and Huntington's disease.  Diseases affecting blood vessels in the brain and resulting in mini-strokes.  Certain infections, such as human immunodeficiency virus (HIV) infection.  Traumatic brain injury.  Other medical conditions such as brain tumors, underactive thyroid (hypothyroidism), and vitamin B12 deficiency.  Use of certain prescription medicine and "recreational" drugs.  What are the signs or symptoms? Symptoms of mild neurocognitive disorder include:  Difficulty remembering. You may forget details of recent events, names, or phone numbers. You may forget important social events and appointments or repeatedly forget where you put your car keys.  Difficulty thinking and solving problems. You may have trouble with complex tasks such as paying bills or driving in unfamiliar locations.  Difficulty communicating. You may have trouble finding the right word,  naming an object, forming a sentence that makes sense, or understanding what you read or hear.  Changes in your behavior or personality. You may lose interest in the things that you used to enjoy or withdraw from social situations. You may get angry more easily than usual. You may act before thinking. You may do things in public that you would not usually do. You may hear or see things that are not real (hallucinations). You may believe falsely that others are trying to hurt you (paranoia).  How is this diagnosed? Mild neurocognitive disorder is diagnosed through an assessment by your health care provider. Your health care provider will ask you and your family, friends, or coworkers questions about your symptoms. He or she will ask how often the symptoms occur, how long they have been occurring, whether they are getting worse, and the effect they are having on your life. Your health care provider may refer you to a neurologist or mental health specialist for a detailed evaluation of your mental functions (neuropsychological testing). To identify the cause of your mild neurocognitive disorder, your health care provider may:  Obtain a detailed medical history.  Ask about alcohol and drug use, including prescription medicine.  Perform a physical exam.  Order blood tests and brain imaging exams.  How is this treated? Mild neurocognitive disorder caused by infections, use of certain medicines or "recreational" drugs, and certain medical conditions may improve with treatment of the condition that is causing the disorder. Mild neurocognitive disorder resulting from other causes generally does not improve and may worsen. In these cases, the goal of treatment is to slow progression of the disorder and help you cope with the loss of mental function. Treatments in these cases include:  Medicine. Medicine helps mainly with memory loss and behavioral symptoms.  Talk therapy.   Talk therapy provides education,  emotional support, memory aids, and other ways of making up for decreases in mental function.  Lifestyle changes. These include regular exercise, a healthy diet (including essential omega-3 fatty acids), intellectual stimulation, and increased social interaction.  This information is not intended to replace advice given to you by your health care provider. Make sure you discuss any questions you have with your health care provider. Document Released: 10/28/2012 Document Revised: 08/03/2015 Document Reviewed: 07/20/2012 Elsevier Interactive Patient Education  2017 [employer]  

## 2017-07-21 NOTE — Progress Notes (Signed)
SLEEP MEDICINE CLINIC   Provider:  Larey Seat, M D  Primary Care Physician:  Marin Olp, MD   Referring Provider: Marin Olp, MD   Chief Complaint  Patient presents with  . New Patient (Initial Visit)    pt has been seen by Dr. Keturah Barre before, this time here with daughter, rm 8. The  pt had serious urosepsis in spring 2018  and she was having confusion at the time. Noted since more Short term memory difficulties.  When she is thirsty, hungry and tired she is less sharp. Urinary incontience episodes are present.  In 8/ 2018 a MRI of the brain was completed and PCP -MD wanted her to f/u with a neurologist. She was asked to stop caffeine after she had palpitations.   Her daughter " Laura Li " states that she can seen a positive difference since moving into [employer] [facility].      HPI:  Laura Li is a 77 y.o. female , seen here as in a referral/ revisit  from Dr. Yong Channel , this time for memory concerns, cognitive changes.  The patient has been followed until 2013 in our sleep clinic.   Laura Li is meanwhile on different medications of course since more than 5 years have passed.  She is taking Synthroid, Corgard, Allegra, Atrovent, Lipitor, Tylenol as needed, Xarelto for anticoagulation and she is on Effexor extended release form 1 capsule of 150 mg daily. She had developed in 2016 a pneumothorax durig a port-a cath procedure for breast cancer treatment.    She had developed a urosepsis as described above by my nurse, about 12 months ago and during the time of hospitalization that followed was also diagnosed with , osteopenia, and appeared confused.  It was at the same time in late March the paroxysmal atrial fibrillation was first witnessed.  By February of this year she also was diagnosed with depression.  Her cardiologist was very interested in her following up with a neurologist.  Her MRI did not show abnormalities it was considered an age-appropriate normal brain.   There was also no evidence of neoplasm metastasizing.  Dr. Lovena Le describes her as a 77 year old Caucasian lady with normal left ventricular function who denies chest pain, shortness of breath or syncope and has no peripheral edema.  She has no difficulties maintaining normal sinus rhythm and has been compliant with her medication, she is tolerating her systemic anticoagulation, which has also been managed to provide coverage from further DVTs.  Hypertension was well controlled.   She is following Dr. Crissie Sickles every 6 months.  Dr. Jana Hakim follows her still for her history of breast cancer and melanoma, and she has been following only once a year is doing well.  Chief complaint according to patient : [name] has noted that she sometimes has trouble finding a word, there is delayed recall and sometimes she does not remember certain things at all.  At times and depending very much on the situation she may be able to find the word of the memory of a certain part of her conversation within the same hour sometimes within a couple of minutes and sometimes within a day.  She prided herself on her excellent long term memory, an good visual memory. She has changed her driving habits, uses GPS more and plans her routes. She avoids highways, night time driving. She hasn't been cooking for over a year.  She is a one person household. She is happy with the decision to  move to [place].   She feels frustrated with some of her cognitive problems, is well aware.   She use to keep records, meticulously , now her cheque account and tax records are just " stuffed"  in an envelope. She doesn't forget meals, she dresses well, keeps a housecleaning service for many years.   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  Widowed since 2003, non smoker, rarely drinking alcohol, exercises in form of walking. One daughter , Laura Li.   Review of Systems: Out of a complete 14 system review,  the patient complains of only the following symptoms, and all other reviewed systems are negative.  She likes to be with people, but lives alone. She snores rarely, she is not sleepy. No SOB. Anemia.   Social History   Socioeconomic History  . Marital status: Widowed    Spouse name: Not on file  . Number of children: Not on file  . Years of education: Not on file  . Highest education level: Not on file  Occupational History  . Occupation: retired  Scientific laboratory technician  . Financial resource strain: Not on file  . Food insecurity:    Worry: Not on file    Inability: Not on file  . Transportation needs:    Medical: Not on file    Non-medical: Not on file  Tobacco Use  . Smoking status: Former Smoker    Packs/day: 0.75    Years: 3.00    Pack years: 2.25    Types: Cigarettes    Last attempt to quit: 03/11/1969    Years since quitting: 48.3  . Smokeless tobacco: Never Used  Substance and Sexual Activity  . Alcohol use: Yes    Alcohol/week: 0.0 oz    Comment: occasional wine  . Drug use: No  . Sexual activity: Never    Birth control/protection: None  Lifestyle  . Physical activity:    Days per week: Not on file    Minutes per session: Not on file  . Stress: Not on file  Relationships  . Social connections:    Talks on phone: Not on file    Gets together: Not on file    Attends religious service: Not on file    Active member of club or organization: Not on file    Attends meetings of clubs or organizations: Not on file    Relationship status: Not on file  . Intimate partner violence:    Fear of current or ex partner: Not on file    Emotionally abused: Not on file    Physically abused: Not on file    Forced sexual activity: Not on file  Other Topics Concern  . Not on file  Social History Narrative   Widowed 2003. Moved to [place] from [place] in 2004 after loss of husband to be near daughter. 1 daughter. 2 grandkids.       Retired from working at a church      Hobbies:  Training and development officer    Family History  Problem Relation Age of Onset  . Heart disease Father 87       smoker  . Heart attack Father   . Endometrial cancer Mother 66  . Breast cancer Cousin        x 3 cousins with breast cancer (one at 76, one at 52 and one at 76)  . Lung cancer Paternal Aunt        smoker  . Brain cancer Maternal  Aunt   . Diabetes Maternal Aunt        ???  . Breast cancer Maternal Aunt 51  . Healthy Sister   . Colon cancer Neg Hx     Past Medical History:  Diagnosis Date  . Anemia    from medicaions  . Arthritis   . Chronic kidney disease    some renal impairment from meds  . Depression    states no depression  . Eczema   . Esophageal stricture   . GERD (gastroesophageal reflux disease)   . Headache   . Heart murmur    states it is benign told 30-40 years ago  . History of DVT (deep vein thrombosis)   . HX: breast cancer    melanoma  . Hyperlipidemia   . Hypertension   . Hypothyroidism   . Melanoma ([place])   . OA (osteoarthritis)   . Personal history of chemotherapy 2016   Left Breast Cancer  . Personal history of radiation therapy 2016   Left Breast Cancer  . Pneumothorax on right 06/15/2014  . PONV (postoperative nausea and vomiting)   . Wears glasses     Past Surgical History:  Procedure Laterality Date  . APPENDECTOMY  1967  . BREAST EXCISIONAL BIOPSY Left   . BREAST LUMPECTOMY Left 2016  . BREAST SURGERY  1977   removal of calcified milk gland right breast  . CHEST TUBE INSERTION  06/15/2014  . CHEST TUBE INSERTION Bilateral 06/15/2014   Procedure: CHEST TUBE INSERTION;  Surgeon: Rolm Bookbinder, MD;  Location: [facility];  Service: General;  Laterality: Bilateral;  . COLONOSCOPY    . [place]   right breast  . MASTECTOMY Right 1998   right-nodes out  . MELANOMA EXCISION  2004   right side of face  . MOHS SURGERY  2005   right left-basal cell  . PORT-A-CATH REMOVAL N/A 10/05/2014   Procedure: REMOVAL PORT-A-CATH;  Surgeon: Rolm Bookbinder, MD;  Location: [facility];  Service: General;  Laterality: N/A;  . PORTACATH PLACEMENT Left 06/15/2014   Procedure: INSERTION PORT-A-CATH;  Surgeon: Rolm Bookbinder, MD;  Location: [facility];  Service: General;  Laterality: Left;  . RADIOACTIVE SEED GUIDED PARTIAL MASTECTOMY WITH AXILLARY SENTINEL LYMPH NODE BIOPSY Left 05/05/2014   Procedure: RADIOACTIVE SEED GUIDED LEFT LUMPECTOMY WITH AXILLARY SENTINEL LYMPH NODE BIOPSY;  Surgeon: Rolm Bookbinder, MD;  Location: [facility];  Service: General;  Laterality: Left;  . TONSILLECTOMY AND ADENOIDECTOMY  1949  . TOTAL ABDOMINAL HYSTERECTOMY  1992   including ovaries    Current Outpatient Medications  Medication Sig Dispense Refill  . acetaminophen (TYLENOL) 500 MG tablet Take 500 mg by mouth 2 (two) times daily as needed for mild pain. Reported on 07/07/2015    . atorvastatin (LIPITOR) 10 MG tablet Take 1 tablet (10 mg total) by mouth daily. 90 tablet 3  . B Complex-C-E-Zn (B COMPLEX-C-E-ZINC) tablet Take 1 tablet by mouth daily.    . B Complex-C-Folic Acid (STRESS FORMULA PO) Take 1 tablet by mouth every morning.     . Calcium-Vitamin D (CALTRATE 600 PLUS-VIT D PO) Take 1 tablet by mouth 2 (two) times daily.     . carboxymethylcellulose (REFRESH PLUS) 0.5 % SOLN 2 drops 3 (three) times daily as needed.    [name] esomeprazole (NEXIUM) 40 MG capsule TAKE 1 BY MOUTH DAILY 90 capsule 2  . fexofenadine (ALLEGRA) 180 MG tablet Take 180 mg by mouth daily as needed for allergies.     [name]  ipratropium (ATROVENT) 0.03 % nasal spray Place 2 sprays into the nose every 12 (twelve) hours. 180 mL 3  . Lactobacillus-Inulin (CULTURELLE DIGESTIVE HEALTH PO) Take 1 capsule by mouth daily.    [name] levothyroxine (SYNTHROID, LEVOTHROID) 88 MCG tablet Take 1 tablet (88 mcg total) by mouth daily. 90 tablet 3  . Multiple Vitamins-Minerals (CENTRUM SILVER PO) Take 1 tablet by mouth daily.    . nadolol (CORGARD) 20 MG tablet TAKE 1/2 TABLET(10 MG)  BY MOUTH DAILY 45 tablet 2  . Propylene Glycol (SYSTANE BALANCE) 0.6 % SOLN Apply 1-2 drops to eye daily as needed (dry eyes).     . rivaroxaban (XARELTO) 20 MG TABS tablet Take 1 tablet (20 mg total) by mouth daily with supper. 12 tablet 0  . venlafaxine XR (EFFEXOR-XR) 150 MG 24 hr capsule TAKE 1 CAPSULE(150 MG) BY MOUTH DAILY 90 capsule 3   No current facility-administered medications for this visit.     Allergies as of 07/21/2017 - Review Complete 07/21/2017  Allergen Reaction Noted  . Dyazide [hydrochlorothiazide w-triamterene]  03/19/2013  . Niacin Other (See Comments)   . Penicillins Diarrhea and Nausea And Vomiting     Vitals: BP 127/69   Pulse 70   Ht 5\' 6"  (1.676 m)   Wt 148 lb (67.1 kg)   BMI 23.89 kg/m  Last Weight:  Wt Readings from Last 1 Encounters:  07/21/17 148 lb (67.1 kg)   EPP:IRJJ mass index is 23.89 kg/m.     Last Height:   Ht Readings from Last 1 Encounters:  07/21/17 5\' 6"  (1.676 m)    Physical exam:  General: The patient is awake, alert and appears not in acute distress.  A little pale. The patient is well groomed. Head: Normocephalic, atraumatic. Neck is supple. ,  neck circumference: 13. 5  Nasal airflow patent ,Retrognathia is mildly seen.  Cardiovascular:  Regular rate and rhythm , without  murmurs or carotid bruit, and without distended neck veins. Respiratory: Lungs are clear to auscultation. Skin:  Without evidence of edema, or rash Trunk: BMI is 24. The patient's posture is erect.    Neurologic exam : The patient is awake and alert, oriented to place and time.   Memory subjective described as impaired when frustrated, emotional.   MOCA: Montreal Cognitive Assessment  07/21/2017  Visuospatial/ Executive (0/5) 5  Naming (0/3) 2  Attention: Read list of digits (0/2) 2  Attention: Read list of letters (0/1) 1  Attention: Serial 7 subtraction starting at 100 (0/3) 3  Language: Repeat phrase (0/2) 2  Language : Fluency (0/1) 1    Abstraction (0/2) 2  Delayed Recall (0/5) 3  Orientation (0/6) 5  Total 26   MMSE: MMSE - Mini Mental State Exam 10/18/2016 10/06/2015  Not completed: Refused -  Orientation to time 4 4  Orientation to Place 5 5  Registration 3 3  Attention/ Calculation 3 5  Attention/Calculation-comments serial 3 from 20; (1 serials 7 from 100) -  Recall 3 2  Language- name 2 objects 2 2  Language- repeat 1 1  Language- follow 3 step command 2 3  Language- read & follow direction 1 1  Write a sentence 1 1  Copy design 1 1  Total score 26 28       Attention span & concentration ability appears normal.  Speech is fluent,  without  dysarthria, dysphonia - she does struggle with some word finding, representing aphasia.  Mood and affect are appropriate, but she perceives herself  as anxious.   Cranial nerves: Pupils are equal and briskly reactive to light. Funduscopic exam withoutevidence of pallor or edemdeferred. Extraocular movements  in vertical and horizontal planes intact and without nystagmus. Visual fields by finger perimetry are intact. Hearing to finger rub intact.   Facial sensation intact to fine touch.  Facial motor strength is symmetric and tongue and uvula move midline. Shoulder shrug was symmetrical.   Motor exam: Normal tone, muscle bulk and symmetric strength in all extremities.  Sensory:  Fine touch, pinprick and vibration were tested in all extremities. Proprioception tested in the upper extremities was normal. Coordination: Rapid alternating movements in the fingers/hands was normal. Finger-to-nose maneuver  normal without evidence of ataxia, dysmetria or tremor. Gait and station: Patient walks without assistive device and is able unassisted to climb up to the exam table. Strength within normal limits.Stance is stable and normal.   Turns with 3  Steps. Romberg testing is  negative. Deep tendon reflexes: in the  upper and lower extremities are symmetric and intact. Babinski  maneuver response is downgoing.   Assessment:  After physical and neurologic examination, review of laboratory studies,  Personal review of imaging studies, reports of other /same  Imaging studies, results of polysomnography and / or neurophysiology testing and pre-existing records as far as provided in visit., my assessment is   1) Delirium in 3/ 2018 , explained by a febrile and septic illness, ATB and other medication with blood brain barrier penetrance.  After the UTI cleared and medication was completed she improved. She is anxious. Her MOCA is excellent at 26/ 30 points.   I think this is her baseline, her new normal   2) according to Dr. Ansel Bong notes the patient scored a Mini-Mental status examination of 28 out of 30 in 2017 and in August 2018 26 out of 30.  Today's test was a Montreal cognitive assessment with a much higher degree of difficulty and she scored 26 out of 30 points.  I do not consider this diagnostic for dementia but I would agree that there is a mild cognitive impairment and this has been quite stable.  There is a 7% conversion rate per year from mild cognitive impairment to some form of dementia and I cannot predict if Mrs. Flaum would be in that category or not.  However, she is not at this stage that warrants use of memory supportive medication.  3)vertigo- for many years. Hearing loss( sudden and many years ago ) she has learned over the years that the vertigo is triggered by looking upwards, or by rapid head movements.  There was no abnormal vertebrobasilar artery found and she had been evaluated for strokes and vascular atherosclerosis before.  She may benefit from some vestibular exercises.  She went for a vestibular rehab 5 or 6 years ago and her vertigo was not vestibular.    The patient was advised of the nature of the diagnosed disorder , the treatment options and the  risks for general health and wellness arising from not treating the condition.   I spent more  than 60 minutes of face to face time with the patient.  Greater than 50% of time was spent in counseling and coordination of care. We have discussed the diagnosis and differential and I answered the patient's questions.    Plan:  Treatment plan and additional workup :  I will print the vertigo maneuvers for her and encourage her to participate in social activities, exercise, and moderate diet. He may  follow as needed, PRN in 12 month .    Larey Seat, MD 7/58/8325, 4:98 PM  Certified in Neurology by ABPN Certified in [place] by [facility] [id number], [place], [place] [address]

## 2017-07-22 ENCOUNTER — Ambulatory Visit (INDEPENDENT_AMBULATORY_CARE_PROVIDER_SITE_OTHER): Payer: Medicare Other | Admitting: Family Medicine

## 2017-07-22 ENCOUNTER — Encounter: Payer: Self-pay | Admitting: Family Medicine

## 2017-07-22 VITALS — BP 118/88 | HR 61 | Temp 97.4°F | Ht 66.0 in | Wt 146.4 lb

## 2017-07-22 DIAGNOSIS — E039 Hypothyroidism, unspecified: Secondary | ICD-10-CM | POA: Diagnosis not present

## 2017-07-22 DIAGNOSIS — I82591 Chronic embolism and thrombosis of other specified deep vein of right lower extremity: Secondary | ICD-10-CM | POA: Diagnosis not present

## 2017-07-22 DIAGNOSIS — K219 Gastro-esophageal reflux disease without esophagitis: Secondary | ICD-10-CM

## 2017-07-22 DIAGNOSIS — I480 Paroxysmal atrial fibrillation: Secondary | ICD-10-CM

## 2017-07-22 DIAGNOSIS — I10 Essential (primary) hypertension: Secondary | ICD-10-CM | POA: Diagnosis not present

## 2017-07-22 DIAGNOSIS — E785 Hyperlipidemia, unspecified: Secondary | ICD-10-CM | POA: Diagnosis not present

## 2017-07-22 DIAGNOSIS — N183 Chronic kidney disease, stage 3 unspecified: Secondary | ICD-10-CM

## 2017-07-22 LAB — TSH: TSH: 0.42 u[IU]/mL (ref 0.35–4.50)

## 2017-07-22 LAB — LDL CHOLESTEROL, DIRECT: Direct LDL: 124 mg/dL

## 2017-07-22 MED ORDER — ESOMEPRAZOLE MAGNESIUM 20 MG PO CPDR: DELAYED_RELEASE_CAPSULE | ORAL | 3 refills | Status: DC

## 2017-07-22 NOTE — Patient Instructions (Addendum)
Only change today- reduce nexium to 20mg . Try this for at least 1-2 weeks. We can go back to 40mg  if getting a lot of reflux but I think its a good idea to try the lower dose  4-6 month follow up  I would also like for you to sign up for an annual wellness visit with one of our nurses, Cassie or Manuela Schwartz, who both specialize in the annual wellness visit. This is a free benefit under medicare that may help [place] find additional ways to help you. Some highlights are reviewing medications, lifestyle, and doing a dementia screen.   Please stop by lab before you go

## 2017-07-22 NOTE — Progress Notes (Signed)
Subjective:  Laura Li is a 76 y.o. year old very pleasant female patient who presents for/with See problem oriented charting ROS- No chest pain or shortness of breath. No headache or blurry vision.    Past Medical History-  Patient Active Problem List   Diagnosis Date Noted  . Memory loss 10/16/2016    Priority: High  . Paroxysmal atrial fibrillation ([place]) 06/04/2016    Priority: High  . Chronic deep vein thrombosis (DVT) ([facility]) 09/01/2015    Priority: High  . Pneumothorax 06/15/2014    Priority: High  . History of left breast cancer 04/24/2014    Priority: High  . History of right breast cancer 04/24/2014    Priority: High  . Vasculitis ([place]) 11/09/2012    Priority: High  . Overactive bladder 09/19/2016    Priority: Medium  . Acute lower UTI 05/29/2016    Priority: Medium  . Osteopenia 05/23/2016    Priority: Medium  . Melanoma of skin ([place]) 03/31/2014    Priority: Medium  . Chronic kidney disease, stage III (moderate) ([place]) 12/09/2012    Priority: Medium  . 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    Priority: Medium  . Hypothyroidism 02/06/2006    Priority: Medium  . Hyperlipidemia 02/06/2006    Priority: Medium  . Essential hypertension 02/06/2006    Priority: Medium  . Localized swelling, mass, or lump of lower extremity 11/05/2014    Priority: Low  . Diverticulitis 08/09/2014    Priority: Low  . Family history of breast cancer 04/29/2014    Priority: Low  . GERD (gastroesophageal reflux disease) 03/31/2014    Priority: Low  . Hot flashes 03/31/2014    Priority: Low  . Osteoarthritis 03/31/2014    Priority: Low  . Allergic rhinitis 02/06/2006    Priority: Low  . MCI (mild cognitive impairment) 07/21/2017  . Word finding difficulty 07/21/2017  . History of breast cancer in adulthood 07/21/2017  . Disorder of bone and cartilage 05/23/2017  . Depression, major, single episode, in partial remission ([place]) 04/24/2017    Medications- reviewed and  updated Current Outpatient Medications  Medication Sig Dispense Refill  . acetaminophen (TYLENOL) 500 MG tablet Take 500 mg by mouth 2 (two) times daily as needed for mild pain. Reported on 07/07/2015    . atorvastatin (LIPITOR) 10 MG tablet Take 1 tablet (10 mg total) by mouth daily. 90 tablet 3  . B Complex-C-E-Zn (B COMPLEX-C-E-ZINC) tablet Take 1 tablet by mouth daily.    . Calcium-Vitamin D (CALTRATE 600 PLUS-VIT D PO) Take 1 tablet by mouth 2 (two) times daily.     . carboxymethylcellulose (REFRESH PLUS) 0.5 % SOLN 2 drops 3 (three) times daily as needed.    [name] esomeprazole (NEXIUM) 20 MG capsule TAKE 1 BY MOUTH DAILY 90 capsule 3  . fexofenadine (ALLEGRA) 180 MG tablet Take 180 mg by mouth daily as needed for allergies.     [name] ipratropium (ATROVENT) 0.03 % nasal spray Place 2 sprays into the nose every 12 (twelve) hours. 180 mL 3  . Lactobacillus-Inulin (CULTURELLE DIGESTIVE HEALTH PO) Take 1 capsule by mouth daily.    [name] levothyroxine (SYNTHROID, LEVOTHROID) 88 MCG tablet Take 1 tablet (88 mcg total) by mouth daily. 90 tablet 3  . Multiple Vitamins-Minerals (CENTRUM SILVER PO) Take 1 tablet by mouth daily.    . nadolol (CORGARD) 20 MG tablet TAKE 1/2 TABLET(10 MG) BY MOUTH DAILY 45 tablet 2  . Propylene Glycol (SYSTANE BALANCE) 0.6 % SOLN Apply 1-2 drops to  eye daily as needed (dry eyes).     . rivaroxaban (XARELTO) 20 MG TABS tablet Take 1 tablet (20 mg total) by mouth daily with supper. 12 tablet 0  . venlafaxine XR (EFFEXOR-XR) 150 MG 24 hr capsule TAKE 1 CAPSULE(150 MG) BY MOUTH DAILY 90 capsule 3   No current facility-administered medications for this visit.     Objective: BP 118/88 (BP Location: Left Arm, Patient Position: Sitting, Cuff Size: Normal)   Pulse 61   Temp (!) 97.4 F (36.3 C) (Oral)   Ht 5\' 6"  (1.676 m)   Wt 146 lb 6.4 oz (66.4 kg)   SpO2 96%   BMI 23.63 kg/m  Gen: NAD, resting comfortably No obvious thyromegaly CV: RRR no murmurs rubs or gallops Lungs: CTAB  no crackles, wheeze, rhonchi Abdomen: soft/nontender/nondistended/normal bowel sounds. No rebound or guarding.  Ext: no edema Skin: warm, dry Neuro: normal gait and speech, some forgetfulness  Assessment/Plan:  Other notes: 1.had a good visit with Dr. Brett Fairy- plan is 1 year follow up. Likely MCI but not changing recently so will follow yearly with her without meds at this point.   2. Is moving to [place]- independent living apartment- has 2 bedroom apartment 3. Hold off on changing venlafaxine for now- consider in future when life in stale place 4. Remains on xarelto given history chronic DVT plus a fib  GERD (gastroesophageal reflux disease) S:  continues on nexium 40mg  for reflux. b12 has been ok Lab Results  Component Value Date   [id number] 10/16/2016  A/P: we will trial 20mg  nexium again- had to go up to 40mg  about 3 years ago- worth another shot given memory issues and CKD III. Perhaps try h2 blocker in future if she tolerates this  Hyperlipidemia S: mild poorly controlled on last check on atorvastatin 10mg  with LDL of 119 but that was 2018  A/P: update direct LDL today. Consider pushing for LDL under 100.   Hypothyroidism S: On thyroid medication-levothyroxine 88 mcg  Lab Results  Component Value Date   TSH 0.88 12/02/2016  A/P: update tsh today  Essential hypertension S: controlled on nadolol 10mg  (half of 20mg  tablet) BP Readings from Last 3 Encounters:  07/22/17 118/88  07/21/17 127/69  07/01/17 (!) 112/95  A/P: We discussed blood pressure goal of <140/90. Continue current meds  Paroxysmal atrial fibrillation ([facility]) Nadolol controlling heart rate. Xarelto for anticoagulation. Paroxysmal- not clearly in a fib today though can only confirm on ekg  Chronic kidney disease, stage III (moderate) gfr stable in 50s- continue to monitor  Future Appointments  Date Time Provider [place]  05/25/2018 10:30 AM Philemon Kingdom, MD [facility] None   07/02/2018  1:30 PM [facility] 1 [facility] None  07/02/2018  2:00 PM Magrinat, Virgie Dad, MD [facility] None  07/23/2018  3:30 PM Dohmeier, Asencion Partridge, MD [facility] None   Return in about 6 months (around 01/22/2018).  Lab/Order associations: Gastroesophageal reflux disease without esophagitis  Hyperlipidemia, unspecified hyperlipidemia type - Plan: TSH, LDL cholesterol, direct  Hypothyroidism, unspecified type - Plan: TSH  Essential hypertension  Paroxysmal atrial fibrillation ([facility]CC)  Chronic kidney disease, stage III (moderate) (HCC)  Chronic deep vein thrombosis (DVT) of other vein of right lower extremity ([facility])  Meds ordered this encounter  Medications  . esomeprazole (NEXIUM) 20 MG capsule    Sig: TAKE 1 BY MOUTH DAILY    Dispense:  90 capsule    Refill:  3    Return precautions advised.  [name], MD

## 2017-07-22 NOTE — Assessment & Plan Note (Signed)
S: mild poorly controlled on last check on atorvastatin 10mg  with LDL of 119 but that was 2018  A/P: update direct LDL today. Consider pushing for LDL under 100.

## 2017-07-22 NOTE — Assessment & Plan Note (Signed)
S: controlled on nadolol 10mg  (half of 20mg  tablet) BP Readings from Last 3 Encounters:  07/22/17 118/88  07/21/17 127/69  07/01/17 (!) 112/95  A/P: We discussed blood pressure goal of <140/90. Continue current meds

## 2017-07-22 NOTE — Assessment & Plan Note (Signed)
S:  continues on nexium 40mg  for reflux. b12 has been ok Lab Results  Component Value Date   [id number] 10/16/2016  A/P: we will trial 20mg  nexium again- had to go up to 40mg  about 3 years ago- worth another shot given memory issues and CKD III. Perhaps try h2 blocker in future if she tolerates this

## 2017-07-22 NOTE — Assessment & Plan Note (Signed)
Nadolol controlling heart rate. Xarelto for anticoagulation. Paroxysmal- not clearly in a fib today though can only confirm on ekg

## 2017-07-22 NOTE — Assessment & Plan Note (Signed)
gfr stable in 50s- continue to monitor

## 2017-07-22 NOTE — Assessment & Plan Note (Signed)
S: On thyroid medication-levothyroxine 88 mcg  Lab Results  Component Value Date   TSH 0.88 12/02/2016  A/P: update tsh today

## 2017-07-22 NOTE — Progress Notes (Signed)
Thyroid looks great.  Bad cholesterol remains above 100.  Please increase her atorvastatin to 20 mg once a day #90 with 3 refills

## 2017-07-23 ENCOUNTER — Other Ambulatory Visit: Payer: Self-pay

## 2017-07-23 MED ORDER — ATORVASTATIN CALCIUM 20 MG PO TABS: 20.0000 mg | ORAL_TABLET | Freq: Every day | ORAL | 3 refills | Status: DC

## 2017-08-05 DIAGNOSIS — H04123 Dry eye syndrome of bilateral lacrimal glands: Secondary | ICD-10-CM | POA: Diagnosis not present

## 2017-08-05 DIAGNOSIS — H1859 Other hereditary corneal dystrophies: Secondary | ICD-10-CM | POA: Diagnosis not present

## 2017-08-08 DIAGNOSIS — H04123 Dry eye syndrome of bilateral lacrimal glands: Secondary | ICD-10-CM | POA: Diagnosis not present

## 2017-08-08 DIAGNOSIS — H1859 Other hereditary corneal dystrophies: Secondary | ICD-10-CM | POA: Diagnosis not present

## 2017-09-05 ENCOUNTER — Other Ambulatory Visit: Payer: Self-pay | Admitting: Family Medicine

## 2017-09-05 DIAGNOSIS — I82541 Chronic embolism and thrombosis of right tibial vein: Secondary | ICD-10-CM

## 2017-09-05 NOTE — Telephone Encounter (Signed)
I looked at this and she only got 12 tablets back in August. Does she need to been seen again for this before we send her a refill?

## 2017-09-23 DIAGNOSIS — Z961 Presence of intraocular lens: Secondary | ICD-10-CM | POA: Diagnosis not present

## 2017-09-23 DIAGNOSIS — H3562 Retinal hemorrhage, left eye: Secondary | ICD-10-CM | POA: Diagnosis not present

## 2017-09-24 ENCOUNTER — Ambulatory Visit (INDEPENDENT_AMBULATORY_CARE_PROVIDER_SITE_OTHER): Payer: Medicare Other | Admitting: Ophthalmology

## 2017-09-24 ENCOUNTER — Encounter (INDEPENDENT_AMBULATORY_CARE_PROVIDER_SITE_OTHER): Payer: Self-pay | Admitting: Ophthalmology

## 2017-09-24 DIAGNOSIS — H3500 Unspecified background retinopathy: Secondary | ICD-10-CM

## 2017-09-24 DIAGNOSIS — H3581 Retinal edema: Secondary | ICD-10-CM | POA: Diagnosis not present

## 2017-09-24 DIAGNOSIS — Z961 Presence of intraocular lens: Secondary | ICD-10-CM | POA: Diagnosis not present

## 2017-09-24 DIAGNOSIS — H3562 Retinal hemorrhage, left eye: Secondary | ICD-10-CM | POA: Diagnosis not present

## 2017-09-24 DIAGNOSIS — I10 Essential (primary) hypertension: Secondary | ICD-10-CM

## 2017-09-24 DIAGNOSIS — H35033 Hypertensive retinopathy, bilateral: Secondary | ICD-10-CM

## 2017-09-24 NOTE — Progress Notes (Addendum)
[facility] Note  09/24/2017     CHIEF COMPLAINT Patient presents for Retina Evaluation   HISTORY OF PRESENT ILLNESS: Laura Li is a 77 y.o. female who presents to the clinic today for:   HPI    Retina Evaluation    In both eyes.  Duration of 1 day.  Associated Symptoms Negative for Flashes, Blind Spot, Photophobia, Scalp Tenderness, Fever, Weight Loss, Jaw Claudication, Glare, Pain, Floaters, Distortion, Redness, Trauma, Shoulder/Hip pain and Fatigue.  Context:  distance vision, mid-range vision and near vision.  Treatments tried include no treatments.  I, the attending physician,  performed the HPI with the patient and updated documentation appropriately.          Comments    Pt presents on the referral of Dr. Jerline Pain for submacular hemorrhage, pt states she saw Dr. Jerline Pain yesterday, states she is seeing floaters OS and has a circle in her central vision, pt denies flashes, pain or wavy vision, pt denies being diabetic, pt is using OTC gtts for dryness PRN       Last edited by Bernarda Caffey, MD on 09/24/2017  2:01 PM. (History)    Pt states she was seen by Dr. Jerline Pain yesterday; Pt states she began to notice a change in OS VA x 2 days ago; Pt states she "had a violent vomiting spell" on Monday; Pt states she began to see little "squiggles" and then noticed a "dark spot"; Pt states spot has not grown in size; Pt endorses being on blood thinner; Pt states BP is well controlled; Pt states she has a sx of "falling in the ocean" and reports having "a tear on the outside of my eye that gradually went away"; Pt denies feeling any type of "pop" when this happened; Pt reports she is unsure why she had such intense vomiting; Pt states Dr. Bing Plume performed cataract sx OU;   Referring physician: Shawnie Dapper, DO [address] [place], [place] [address]  HISTORICAL INFORMATION:   Selected notes from the MEDICAL RECORD NUMBER    CURRENT MEDICATIONS: Current  Outpatient Medications (Ophthalmic Drugs)  Medication Sig  . carboxymethylcellulose (REFRESH PLUS) 0.5 % SOLN 2 drops 3 (three) times daily as needed.  [name] Propylene Glycol (SYSTANE BALANCE) 0.6 % SOLN Apply 1-2 drops to eye daily as needed (dry eyes).    No current facility-administered medications for this visit.  (Ophthalmic Drugs)   Current Outpatient Medications (Other)  Medication Sig  . vitamin B-12 (CYANOCOBALAMIN) 100 MCG tablet Take 100 mcg by mouth daily.  [name] acetaminophen (TYLENOL) 500 MG tablet Take 500 mg by mouth 2 (two) times daily as needed for mild pain. Reported on 07/07/2015  . atorvastatin (LIPITOR) 20 MG tablet Take 1 tablet (20 mg total) by mouth daily.  . B Complex-C-E-Zn (B COMPLEX-C-E-ZINC) tablet Take 1 tablet by mouth daily.  . Calcium-Vitamin D (CALTRATE 600 PLUS-VIT D PO) Take 1 tablet by mouth 2 (two) times daily.   [name] esomeprazole (NEXIUM) 20 MG capsule TAKE 1 BY MOUTH DAILY  . fexofenadine (ALLEGRA) 180 MG tablet Take 180 mg by mouth daily as needed for allergies.   [name] ipratropium (ATROVENT) 0.03 % nasal spray Place 2 sprays into the nose every 12 (twelve) hours.  . Lactobacillus-Inulin (CULTURELLE DIGESTIVE HEALTH PO) Take 1 capsule by mouth daily.  [name] levothyroxine (SYNTHROID, LEVOTHROID) 88 MCG tablet Take 1 tablet (88 mcg total) by mouth daily.  . Multiple Vitamins-Minerals (CENTRUM SILVER PO) Take 1 tablet  by mouth daily.  . nadolol (CORGARD) 20 MG tablet TAKE 1/2 TABLET(10 MG) BY MOUTH DAILY  . venlafaxine XR (EFFEXOR-XR) 150 MG 24 hr capsule TAKE 1 CAPSULE(150 MG) BY MOUTH DAILY  . XARELTO 20 MG TABS tablet TAKE 1 TABLET BY MOUTH DAILY WITH SUPPER   No current facility-administered medications for this visit.  (Other)      REVIEW OF SYSTEMS:    ALLERGIES Allergies  Allergen Reactions  . Dyazide [Hydrochlorothiazide W-Triamterene]     Presumption: drug induced vasculitis  . Niacin Other (See Comments)    flushing  . Penicillins Diarrhea and  Nausea And Vomiting    PAST MEDICAL HISTORY Past Medical History:  Diagnosis Date  . Anemia    from medicaions  . Arthritis   . Chronic kidney disease    some renal impairment from meds  . Depression    states no depression  . Eczema   . Esophageal stricture   . GERD (gastroesophageal reflux disease)   . Headache   . Heart murmur    states it is benign told 30-40 years ago  . History of DVT (deep vein thrombosis)   . HX: breast cancer    melanoma  . Hyperlipidemia   . Hypertension   . Hypothyroidism   . Melanoma ([place])   . OA (osteoarthritis)   . Personal history of chemotherapy 2016   Left Breast Cancer  . Personal history of radiation therapy 2016   Left Breast Cancer  . Pneumothorax on right 06/15/2014  . PONV (postoperative nausea and vomiting)   . Wears glasses    Past Surgical History:  Procedure Laterality Date  . APPENDECTOMY  1967  . BREAST EXCISIONAL BIOPSY Left   . BREAST LUMPECTOMY Left 2016  . BREAST SURGERY  1977   removal of calcified milk gland right breast  . CHEST TUBE INSERTION  06/15/2014  . CHEST TUBE INSERTION Bilateral 06/15/2014   Procedure: CHEST TUBE INSERTION;  Surgeon: Rolm Bookbinder, MD;  Location: [facility];  Service: General;  Laterality: Bilateral;  . COLONOSCOPY    . [place]   right breast  . MASTECTOMY Right 1998   right-nodes out  . MELANOMA EXCISION  2004   right side of face  . MOHS SURGERY  2005   right left-basal cell  . PORT-A-CATH REMOVAL N/A 10/05/2014   Procedure: REMOVAL PORT-A-CATH;  Surgeon: Rolm Bookbinder, MD;  Location: [facility];  Service: General;  Laterality: N/A;  . PORTACATH PLACEMENT Left 06/15/2014   Procedure: INSERTION PORT-A-CATH;  Surgeon: Rolm Bookbinder, MD;  Location: [facility];  Service: General;  Laterality: Left;  . RADIOACTIVE SEED GUIDED PARTIAL MASTECTOMY WITH AXILLARY SENTINEL LYMPH NODE BIOPSY Left 05/05/2014   Procedure: RADIOACTIVE SEED GUIDED LEFT LUMPECTOMY WITH  AXILLARY SENTINEL LYMPH NODE BIOPSY;  Surgeon: Rolm Bookbinder, MD;  Location: [facility];  Service: General;  Laterality: Left;  . TONSILLECTOMY AND ADENOIDECTOMY  1949  . TOTAL ABDOMINAL HYSTERECTOMY  1992   including ovaries    FAMILY HISTORY Family History  Problem Relation Age of Onset  . Heart disease Father 19       smoker  . Heart attack Father   . Endometrial cancer Mother 72  . Breast cancer Cousin        x 3 cousins with breast cancer (one at 47, one at 55 and one at 45)  . Lung cancer Paternal Aunt        smoker  . Brain cancer  Maternal Aunt   . Diabetes Maternal Aunt        ???  . Breast cancer Maternal Aunt 51  . Healthy Sister   . Colon cancer Neg Hx   . Amblyopia Neg Hx   . Blindness Neg Hx   . Cataracts Neg Hx   . Glaucoma Neg Hx   . Macular degeneration Neg Hx   . Retinal detachment Neg Hx   . Strabismus Neg Hx   . Retinitis pigmentosa Neg Hx     SOCIAL HISTORY Social History   Tobacco Use  . Smoking status: Former Smoker    Packs/day: 0.75    Years: 3.00    Pack years: 2.25    Types: Cigarettes    Last attempt to quit: 03/11/1969    Years since quitting: 48.5  . Smokeless tobacco: Never Used  Substance Use Topics  . Alcohol use: Yes    Alcohol/week: 0.0 oz    Comment: occasional wine  . Drug use: No         OPHTHALMIC EXAM:  Base Eye Exam    Visual Acuity (Snellen - Linear)      Right Left   Dist cc 20/20 -1 20/400   Dist ph cc NI 20/350   Correction:  Glasses       Tonometry (Tonopen, 1:18 PM)      Right Left   Pressure 16 15       Pupils      Dark Light Shape React APD   Right 4 2 Round Brisk None   Left 4 2 Round Brisk None       Visual Fields (Counting fingers)      Left Right     Full   Restrictions Total inferior temporal, inferior nasal deficiencies        Extraocular Movement      Right Left    Full, Ortho Full, Ortho       Neuro/Psych    Oriented x3:  Yes   Mood/Affect:  Normal        Dilation    Both eyes:  1.0% Mydriacyl, 2.5% Phenylephrine @ 1:18 PM        Slit Lamp and Fundus Exam    Slit Lamp Exam      Right Left   Lids/Lashes Dermatochalasis - upper lid, Telangiectasia Dermatochalasis - upper lid, Telangiectasia   Conjunctiva/Sclera White and quiet White and quiet   Cornea Arcus, Anterior basement membrane dystrophy, Temporal Well healed cataract wounds, nasal LRI Arcus, Anterior basement membrane dystrophy, Inferior 1+ Punctate epithelial erosions, Temporal Well healed cataract wounds   Anterior Chamber Deep and quiet Deep and quiet   Iris Round and dilated Round and dilated   Lens Multifocal PC IOL in good position with open PC Multifocal PC IOL in good position with open PC   Vitreous Vitreous syneresis, Posterior vitreous detachment Vitreous syneresis       Fundus Exam      Right Left   Disc Pink and Sharp Pink and Sharp   C/D Ratio 0.45 0.45   Macula Blunted foveal reflex, mild Retinal pigment epithelial mottling, No heme or edema Massive sub-retinal hemorrhage along superior arcades and involving fovea, pre-retinal hemorrhage overlying and surrounding subretinal hemorrhage    Vessels Mild Vascular attenuation, trace AV crossing changes Mild Vascular attenuation, trace AV crossing changes   Periphery Attached Attached          IMAGING AND PROCEDURES  Imaging and Procedures for @TODAY @  OCT, Retina -  OU - Both Eyes       Right Eye Quality was good. Central Foveal Thickness: 242. Progression has no prior data. Findings include normal foveal contour, no IRF, no SRF (No drusen).   Left Eye Quality was good. Central Foveal Thickness: 790. Progression has no prior data. Findings include subretinal fluid, abnormal foveal contour, intraretinal fluid.   Notes *Images captured and stored on drive  Diagnosis / Impression:  OD: NFP, No IRF/SRF OS: Massive sub-retinal fluid/hemorrhage; mild surrounding IRF  Clinical management:  See  below  Abbreviations: NFP - Normal foveal profile. CME - cystoid macular edema. PED - pigment epithelial detachment. IRF - intraretinal fluid. SRF - subretinal fluid. EZ - ellipsoid zone. ERM - epiretinal membrane. ORA - outer retinal atrophy. ORT - outer retinal tubulation. SRHM - subretinal hyper-reflective material         Fluorescein Angiography Optos (Transit OS)       Right Eye Progression has no prior data. Early phase findings include normal observations. Mid/Late phase findings include normal observations.   Left Eye Progression has no prior data. Early phase findings include blockage. Mid/Late phase findings include blockage.                 ASSESSMENT/PLAN:    ICD-10-CM   1. Subretinal hemorrhage of left eye H35.62 Fluorescein Angiography Optos (Transit OS)  2. Retinal edema H35.81 OCT, Retina - OU - Both Eyes  3. 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 Essential hypertension I10   5. Hypertensive retinopathy of both eyes H35.033 Fluorescein Angiography Optos (Transit OS)  6. Pseudophakia of both eyes Z96.1     1-3. Large sub-macular hemorrhage OS-  - onset of decreased vision associated with violent vomiting/wretching - on xarelto for history of DVT - suspect Valsalva retinopathy based on history and presentation -- slightly atypical to have a significant subretinal component - exam shows large submacular hemorrhage with some mild preretinal component -- no drusen or obvious CNVM to suggest a neovascular AMD component - FA shows blockage corresponding to heme, but no leakage or obvious macroaneurysm or CNVM - discussed findings and guarded prognosis and treatment options - recommend observation -- pt in agreement - minimize activities, avoid ASA/NSAIDs/blood thinners as able - F/U 1-2 weeks, DFE, OCT, color fundus photos  4,5. Hypertensive retinopathy OU - discussed importance of tight BP control - monitor  6.  Pseudophakia OU  - s/p CE/IOL  - beautiful surgery, doing well  - monitor   Ophthalmic Meds Ordered this visit:  No orders of the defined types were placed in this encounter.      Return in about 2 weeks (around 10/08/2017) for F/U sub-retinal hem OS, DFE, OCT, color fundus.  There are no Patient Instructions on file for this visit.   Explained the diagnoses, plan, and follow up with the patient and they expressed understanding.  Patient expressed understanding of the importance of proper follow up care.   This document serves as a record of services personally performed by Gardiner Sleeper, MD, PhD. It was created on their behalf by Catha Brow, [place], a certified ophthalmic assistant. The creation of this record is the provider's dictation and/or activities during the visit.  Electronically signed by: Catha Brow, COA  07.17.19 11:21 PM   Gardiner Sleeper, M.D., Ph.D. Diseases & Surgery of the Retina and Vitreous [facility]   I have reviewed the above documentation for accuracy and completeness, and I agree with  the above. Gardiner Sleeper, M.D., Ph.D. 09/24/17 11:21 PM   Abbreviations: M myopia (nearsighted); A astigmatism; H hyperopia (farsighted); P presbyopia; Mrx spectacle prescription;  CTL contact lenses; OD right eye; OS left eye; OU both eyes  XT exotropia; ET esotropia; PEK punctate epithelial keratitis; PEE punctate epithelial erosions; DES dry eye syndrome; MGD meibomian gland dysfunction; ATs artificial tears; PFAT's preservative free artificial tears; [place] nuclear sclerotic cataract; PSC posterior subcapsular cataract; ERM epi-retinal membrane; PVD posterior vitreous detachment; RD retinal detachment; DM diabetes mellitus; DR diabetic retinopathy; NPDR non-proliferative diabetic retinopathy; PDR proliferative diabetic retinopathy; CSME clinically significant macular edema; DME diabetic macular edema; dbh dot blot hemorrhages; CWS cotton wool  spot; POAG primary open angle glaucoma; C/D cup-to-disc ratio; HVF humphrey visual field; GVF goldmann visual field; OCT optical coherence tomography; IOP intraocular pressure; BRVO Branch retinal vein occlusion; CRVO central retinal vein occlusion; CRAO central retinal artery occlusion; BRAO branch retinal artery occlusion; RT retinal tear; SB scleral buckle; PPV pars plana vitrectomy; VH Vitreous hemorrhage; PRP panretinal laser photocoagulation; IVK intravitreal kenalog; VMT vitreomacular traction; MH Macular hole;  NVD neovascularization of the disc; NVE neovascularization elsewhere; AREDS age related eye disease study; ARMD age related macular degeneration; POAG primary open angle glaucoma; EBMD epithelial/anterior basement membrane dystrophy; ACIOL anterior chamber intraocular lens; IOL intraocular lens; PCIOL posterior chamber intraocular lens; Phaco/IOL phacoemulsification with intraocular lens placement; [place] photorefractive keratectomy; LASIK laser assisted in situ keratomileusis; HTN hypertension; DM diabetes mellitus; COPD chronic obstructive pulmonary disease

## 2017-10-06 NOTE — Progress Notes (Signed)
[facility] Note  10/08/2017     CHIEF COMPLAINT Patient presents for Retina Follow Up   HISTORY OF PRESENT ILLNESS: Laura Li is a 77 y.o. female who presents to the clinic today for:   HPI    Retina Follow Up    Patient presents with  Other.  Severity is moderate.  Duration of 2 weeks.  Since onset it is stable.  I, the attending physician,  performed the HPI with the patient and updated documentation appropriately.          Comments    Pt presents for subretinal heme OS, pt states she feels like it may have gotten a little better, but it is definitely still there, pt states she is currently unable to read, pt states she has seen a few floaters, but denies FOL, pain and wavy vision, pt is using OTC gtts       Last edited by Bernarda Caffey, MD on 10/08/2017  3:04 PM. (History)    Pt reports taking BP medications; Pt states she is no longer taking xarelto;   Referring physician: Marin Olp, MD [place], [place] [address]  HISTORICAL INFORMATION:   Selected notes from the MEDICAL RECORD NUMBER    CURRENT MEDICATIONS: Current Outpatient Medications (Ophthalmic Drugs)  Medication Sig  . carboxymethylcellulose (REFRESH PLUS) 0.5 % SOLN 2 drops 3 (three) times daily as needed.  [name] Propylene Glycol (SYSTANE BALANCE) 0.6 % SOLN Apply 1-2 drops to eye daily as needed (dry eyes).    No current facility-administered medications for this visit.  (Ophthalmic Drugs)   Current Outpatient Medications (Other)  Medication Sig  . acetaminophen (TYLENOL) 500 MG tablet Take 500 mg by mouth 2 (two) times daily as needed for mild pain. Reported on 07/07/2015  . atorvastatin (LIPITOR) 20 MG tablet Take 1 tablet (20 mg total) by mouth daily.  . B Complex-C-E-Zn (B COMPLEX-C-E-ZINC) tablet Take 1 tablet by mouth daily.  . Calcium-Vitamin D (CALTRATE 600 PLUS-VIT D PO) Take 1 tablet by mouth 2 (two) times daily.   [name] esomeprazole (NEXIUM) 20  MG capsule TAKE 1 BY MOUTH DAILY  . fexofenadine (ALLEGRA) 180 MG tablet Take 180 mg by mouth daily as needed for allergies.   [name] ipratropium (ATROVENT) 0.03 % nasal spray Place 2 sprays into the nose every 12 (twelve) hours.  . Lactobacillus-Inulin (CULTURELLE DIGESTIVE HEALTH PO) Take 1 capsule by mouth daily.  [name] levothyroxine (SYNTHROID, LEVOTHROID) 88 MCG tablet Take 1 tablet (88 mcg total) by mouth daily.  . Multiple Vitamins-Minerals (CENTRUM SILVER PO) Take 1 tablet by mouth daily.  . nadolol (CORGARD) 20 MG tablet TAKE 1/2 TABLET(10 MG) BY MOUTH DAILY  . venlafaxine XR (EFFEXOR-XR) 150 MG 24 hr capsule TAKE 1 CAPSULE(150 MG) BY MOUTH DAILY  . vitamin B-12 (CYANOCOBALAMIN) 100 MCG tablet Take 100 mcg by mouth daily.  Laura Li 20 MG TABS tablet TAKE 1 TABLET BY MOUTH DAILY WITH SUPPER   No current facility-administered medications for this visit.  (Other)      REVIEW OF SYSTEMS: ROS    Positive for: Musculoskeletal, Endocrine, Cardiovascular, Eyes, Heme/Lymph   Negative for: Constitutional, Gastrointestinal, Neurological, Skin, Genitourinary, HENT, Respiratory, Psychiatric, Allergic/Imm   Last edited by Laura Li, COT on 10/08/2017  1:52 PM. (History)       ALLERGIES Allergies  Allergen Reactions  . Dyazide [Hydrochlorothiazide W-Triamterene]     Presumption: drug induced vasculitis  . Niacin Other (See Comments)  flushing  . Penicillins Diarrhea and Nausea And Vomiting    PAST MEDICAL HISTORY Past Medical History:  Diagnosis Date  . Anemia    from medicaions  . Arthritis   . Chronic kidney disease    some renal impairment from meds  . Depression    states no depression  . Eczema   . Esophageal stricture   . GERD (gastroesophageal reflux disease)   . Headache   . Heart murmur    states it is benign told 30-40 years ago  . History of DVT (deep vein thrombosis)   . HX: breast cancer    melanoma  . Hyperlipidemia   . Hypertension   . Hypothyroidism    . Melanoma ([place])   . OA (osteoarthritis)   . Personal history of chemotherapy 2016   Left Breast Cancer  . Personal history of radiation therapy 2016   Left Breast Cancer  . Pneumothorax on right 06/15/2014  . PONV (postoperative nausea and vomiting)   . Wears glasses    Past Surgical History:  Procedure Laterality Date  . APPENDECTOMY  1967  . BREAST EXCISIONAL BIOPSY Left   . BREAST LUMPECTOMY Left 2016  . BREAST SURGERY  1977   removal of calcified milk gland right breast  . CHEST TUBE INSERTION  06/15/2014  . CHEST TUBE INSERTION Bilateral 06/15/2014   Procedure: CHEST TUBE INSERTION;  Surgeon: Rolm Bookbinder, MD;  Location: [facility];  Service: General;  Laterality: Bilateral;  . COLONOSCOPY    . [place]   right breast  . MASTECTOMY Right 1998   right-nodes out  . MELANOMA EXCISION  2004   right side of face  . MOHS SURGERY  2005   right left-basal cell  . PORT-A-CATH REMOVAL N/A 10/05/2014   Procedure: REMOVAL PORT-A-CATH;  Surgeon: Rolm Bookbinder, MD;  Location: [facility];  Service: General;  Laterality: N/A;  . PORTACATH PLACEMENT Left 06/15/2014   Procedure: INSERTION PORT-A-CATH;  Surgeon: Rolm Bookbinder, MD;  Location: [facility];  Service: General;  Laterality: Left;  . RADIOACTIVE SEED GUIDED PARTIAL MASTECTOMY WITH AXILLARY SENTINEL LYMPH NODE BIOPSY Left 05/05/2014   Procedure: RADIOACTIVE SEED GUIDED LEFT LUMPECTOMY WITH AXILLARY SENTINEL LYMPH NODE BIOPSY;  Surgeon: Rolm Bookbinder, MD;  Location: [facility];  Service: General;  Laterality: Left;  . TONSILLECTOMY AND ADENOIDECTOMY  1949  . TOTAL ABDOMINAL HYSTERECTOMY  1992   including ovaries    FAMILY HISTORY Family History  Problem Relation Age of Onset  . Heart disease Father 72       smoker  . Heart attack Father   . Endometrial cancer Mother 61  . Breast cancer Cousin        x 3 cousins with breast cancer (one at 29, one at 65 and one at 32)  . Lung  cancer Paternal Aunt        smoker  . Brain cancer Maternal Aunt   . Diabetes Maternal Aunt        ???  . Breast cancer Maternal Aunt 51  . Healthy Sister   . Colon cancer Neg Hx   . Amblyopia Neg Hx   . Blindness Neg Hx   . Cataracts Neg Hx   . Glaucoma Neg Hx   . Macular degeneration Neg Hx   . Retinal detachment Neg Hx   . Strabismus Neg Hx   . Retinitis pigmentosa Neg Hx     SOCIAL HISTORY Social History   Tobacco Use  .  Smoking status: Former Smoker    Packs/day: 0.75    Years: 3.00    Pack years: 2.25    Types: Cigarettes    Last attempt to quit: 03/11/1969    Years since quitting: 48.6  . Smokeless tobacco: Never Used  Substance Use Topics  . Alcohol use: Yes    Alcohol/week: 0.0 oz    Comment: occasional wine  . Drug use: No         OPHTHALMIC EXAM:  Base Eye Exam    Visual Acuity (Snellen - Linear)      Right Left   Dist cc 20/20 -2 CF at 2'   Dist ph cc NI NI   Correction:  Glasses       Tonometry (Tonopen, 1:58 PM)      Right Left   Pressure 14 16       Pupils      Dark Light Shape React APD   Right 4 2 Round Brisk None   Left 4 2 Round Brisk None       Visual Fields (Counting fingers)      Left Right     Full   Restrictions Total superior temporal, inferior temporal deficiencies        Extraocular Movement      Right Left    Full, Ortho Full, Ortho       Neuro/Psych    Oriented x3:  Yes   Mood/Affect:  Normal       Dilation    Both eyes:  1.0% Mydriacyl, 2.5% Phenylephrine @ 1:58 PM        Slit Lamp and Fundus Exam    Slit Lamp Exam      Right Left   Lids/Lashes Dermatochalasis - upper lid, Telangiectasia Dermatochalasis - upper lid, Telangiectasia   Conjunctiva/Sclera White and quiet White and quiet   Cornea Arcus, Anterior basement membrane dystrophy, Temporal Well healed cataract wounds, nasal LRI Arcus, Anterior basement membrane dystrophy, Inferior 1+ Punctate epithelial erosions, Temporal Well healed cataract  wounds   Anterior Chamber Deep and quiet Deep and quiet   Iris Round and dilated Round and dilated   Lens Multifocal PC IOL in good position with open PC Multifocal PC IOL in good position with open PC   Vitreous Vitreous syneresis, Posterior vitreous detachment Vitreous syneresis       Fundus Exam      Right Left   Disc Pink and Sharp Pink and Sharp   C/D Ratio 0.45 0.45   Macula Blunted foveal reflex, mild Retinal pigment epithelial mottling, No heme or edema Massive sub-retinal hemorrhage along superior arcades and involving fovea, pre-retinal hemorrhage overlying and surrounding subretinal hemorrhage  -- mild interval improvement   Vessels Mild Vascular attenuation, trace AV crossing changes retinal macroaneurysm; Mild Vascular attenuation, trace AV crossing changes   Periphery Attached Attached          IMAGING AND PROCEDURES  Imaging and Procedures for @TODAY @  OCT, Retina - OU - Both Eyes       Right Eye Quality was good. Central Foveal Thickness: 245. Progression has been stable. Findings include normal foveal contour, no IRF, no SRF (No drusen).   Left Eye Quality was good. Central Foveal Thickness: 476. Progression has improved. Findings include subretinal fluid, abnormal foveal contour, intraretinal fluid.   Notes *Images captured and stored on drive  Diagnosis / Impression:  OD: NFP, No IRF/SRF OS: Massive sub-retinal fluid/hemorrhage; mild surrounding IRF - mild interval improvement  Clinical management:  See below  Abbreviations: NFP - Normal foveal profile. CME - cystoid macular edema. PED - pigment epithelial detachment. IRF - intraretinal fluid. SRF - subretinal fluid. EZ - ellipsoid zone. ERM - epiretinal membrane. ORA - outer retinal atrophy. ORT - outer retinal tubulation. SRHM - subretinal hyper-reflective material         Fluorescein Angiography Optos (Transit OS)       Right Eye Progression has no prior data. Early phase findings include  normal observations. Mid/Late phase findings include normal observations.   Left Eye Progression has no prior data. Early phase findings include blockage, staining, leakage. Mid/Late phase findings include blockage, staining, leakage.   Notes *Images captured and stored on drive;   Impression: OD: normal study OS: improvement in blockage; focal hyperfluorescence overlying blockage consistent with retinal arterial macroaneurysm                  ASSESSMENT/PLAN:    ICD-10-CM   1. Subretinal hemorrhage of left eye H35.62 Fluorescein Angiography Optos (Transit OS)    CANCELED: Intravitreal Injection, Pharmacologic Agent - OS - Left Eye  2. 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 Retinal edema H35.81 OCT, Retina - OU - Both Eyes    Fluorescein Angiography Optos (Transit OS)  4. Valsalva retinopathy H35.00   5. Essential hypertension I10   6. Hypertensive retinopathy of both eyes H35.033 Fluorescein Angiography Optos (Transit OS)  7. Pseudophakia of both eyes Z96.1     1-4. Retinal macroaneurysm with large sub-macular hemorrhage OS-  - onset of decreased vision associated with violent vomiting/wretching - on xarelto for history of DVT -- pt has stopped for now - exam shows mild improvement / evolution of large submacular hemorrhage with mild preretinal heme - retinal macroaneurysm now visible on exam and FA - recommend systematic work-up for hypertension and systemic vascular disease if not done recently - will send message to PCP, Dr. Yong Channel - discussed findings and guarded prognosis and treatment options - recommend IVA OS #1 today  - pt wishes to speak to daughter prior to having treatment - pt will call back to schedule appointment  5,6. Hypertensive retinopathy OU - discussed importance of tight BP control - monitor  7. Pseudophakia OU  - s/p CE/IOL  - beautiful surgery, doing well  - monitor   Ophthalmic Meds Ordered this visit:  No orders of the  defined types were placed in this encounter.      Return if symptoms worsen or fail to improve.  There are no Patient Instructions on file for this visit.   Explained the diagnoses, plan, and follow up with the patient and they expressed understanding.  Patient expressed understanding of the importance of proper follow up care.   This document serves as a record of services personally performed by Gardiner Sleeper, MD, PhD. It was created on their behalf by Ernest Mallick, OA, an ophthalmic assistant. The creation of this record is the provider's dictation and/or activities during the visit.    Electronically signed by: Ernest Mallick, OA  07.29.2019 8:29 AM    Gardiner Sleeper, M.D., Ph.D. Diseases & Surgery of the Retina and Vitreous [facility]   I have reviewed the above documentation for accuracy and completeness, and I agree with the above. Gardiner Sleeper, M.D., Ph.D. 10/09/17 8:29 AM   Abbreviations: M myopia (nearsighted); A astigmatism; H hyperopia (farsighted); P presbyopia; Mrx spectacle prescription;  CTL contact lenses; OD right eye; OS left  eye; OU both eyes  XT exotropia; ET esotropia; PEK punctate epithelial keratitis; PEE punctate epithelial erosions; DES dry eye syndrome; MGD meibomian gland dysfunction; ATs artificial tears; PFAT's preservative free artificial tears; [place] nuclear sclerotic cataract; PSC posterior subcapsular cataract; ERM epi-retinal membrane; PVD posterior vitreous detachment; RD retinal detachment; DM diabetes mellitus; DR diabetic retinopathy; NPDR non-proliferative diabetic retinopathy; PDR proliferative diabetic retinopathy; CSME clinically significant macular edema; DME diabetic macular edema; dbh dot blot hemorrhages; CWS cotton wool spot; POAG primary open angle glaucoma; C/D cup-to-disc ratio; HVF humphrey visual field; GVF goldmann visual field; OCT optical coherence tomography; IOP intraocular pressure; BRVO Branch retinal vein  occlusion; CRVO central retinal vein occlusion; CRAO central retinal artery occlusion; BRAO branch retinal artery occlusion; RT retinal tear; SB scleral buckle; PPV pars plana vitrectomy; VH Vitreous hemorrhage; PRP panretinal laser photocoagulation; IVK intravitreal kenalog; VMT vitreomacular traction; MH Macular hole;  NVD neovascularization of the disc; NVE neovascularization elsewhere; AREDS age related eye disease study; ARMD age related macular degeneration; POAG primary open angle glaucoma; EBMD epithelial/anterior basement membrane dystrophy; ACIOL anterior chamber intraocular lens; IOL intraocular lens; PCIOL posterior chamber intraocular lens; Phaco/IOL phacoemulsification with intraocular lens placement; [place] photorefractive keratectomy; LASIK laser assisted in situ keratomileusis; HTN hypertension; DM diabetes mellitus; COPD chronic obstructive pulmonary disease

## 2017-10-08 ENCOUNTER — Ambulatory Visit (INDEPENDENT_AMBULATORY_CARE_PROVIDER_SITE_OTHER): Payer: Medicare Other | Admitting: Ophthalmology

## 2017-10-08 ENCOUNTER — Encounter (INDEPENDENT_AMBULATORY_CARE_PROVIDER_SITE_OTHER): Payer: Self-pay | Admitting: Ophthalmology

## 2017-10-08 DIAGNOSIS — Z961 Presence of intraocular lens: Secondary | ICD-10-CM

## 2017-10-08 DIAGNOSIS — H3581 Retinal edema: Secondary | ICD-10-CM

## 2017-10-08 DIAGNOSIS — H3562 Retinal hemorrhage, left eye: Secondary | ICD-10-CM | POA: Diagnosis not present

## 2017-10-08 DIAGNOSIS — H35033 Hypertensive retinopathy, bilateral: Secondary | ICD-10-CM

## 2017-10-08 DIAGNOSIS — H35012 Changes in retinal vascular appearance, left eye: Secondary | ICD-10-CM | POA: Diagnosis not present

## 2017-10-08 DIAGNOSIS — H3500 Unspecified background retinopathy: Secondary | ICD-10-CM

## 2017-10-08 DIAGNOSIS — I10 Essential (primary) hypertension: Secondary | ICD-10-CM

## 2017-10-09 ENCOUNTER — Encounter (INDEPENDENT_AMBULATORY_CARE_PROVIDER_SITE_OTHER): Payer: Self-pay | Admitting: Ophthalmology

## 2017-10-09 NOTE — Progress Notes (Signed)
[facility] Note  10/10/2017     CHIEF COMPLAINT Patient presents for Retina Follow Up   HISTORY OF PRESENT ILLNESS: Laura Li is a 77 y.o. female who presents to the clinic today for:   HPI    Retina Follow Up    Patient presents with  Other.  In left eye.  This started 2 weeks ago.  Severity is severe.  Duration of 2 weeks.  I, the attending physician,  performed the HPI with the patient and updated documentation appropriately.          Comments    77 y/o female pt here for IVA OS for subretinal hemorrhage OS.  No change in vision OU.  Denies pain, flashes, but still sees stuff floating in her vision OS.  Retaine prn OU.  Refresh gel QHS OU.       Last edited by Bernarda Caffey, MD on 10/12/2017 11:39 PM. (History)    Pt reports taking BP medications; Pt states she is no longer taking xarelto;   Referring physician: Marin Olp, MD [place], [place] [address]  HISTORICAL INFORMATION:   Selected notes from the MEDICAL RECORD NUMBER    CURRENT MEDICATIONS: Current Outpatient Medications (Ophthalmic Drugs)  Medication Sig  . carboxymethylcellulose (REFRESH PLUS) 0.5 % SOLN 2 drops 3 (three) times daily as needed.  [name] Propylene Glycol (SYSTANE BALANCE) 0.6 % SOLN Apply 1-2 drops to eye daily as needed (dry eyes).    No current facility-administered medications for this visit.  (Ophthalmic Drugs)   Current Outpatient Medications (Other)  Medication Sig  . acetaminophen (TYLENOL) 500 MG tablet Take 500 mg by mouth 2 (two) times daily as needed for mild pain. Reported on 07/07/2015  . atorvastatin (LIPITOR) 20 MG tablet Take 1 tablet (20 mg total) by mouth daily.  . B Complex-C-E-Zn (B COMPLEX-C-E-ZINC) tablet Take 1 tablet by mouth daily.  . Calcium-Vitamin D (CALTRATE 600 PLUS-VIT D PO) Take 1 tablet by mouth 2 (two) times daily.   [name] esomeprazole (NEXIUM) 20 MG capsule TAKE 1 BY MOUTH DAILY  . fexofenadine (ALLEGRA) 180  MG tablet Take 180 mg by mouth daily as needed for allergies.   [name] ipratropium (ATROVENT) 0.03 % nasal spray Place 2 sprays into the nose every 12 (twelve) hours.  . Lactobacillus-Inulin (CULTURELLE DIGESTIVE HEALTH PO) Take 1 capsule by mouth daily.  [name] levothyroxine (SYNTHROID, LEVOTHROID) 88 MCG tablet Take 1 tablet (88 mcg total) by mouth daily.  . Multiple Vitamins-Minerals (CENTRUM SILVER PO) Take 1 tablet by mouth daily.  . nadolol (CORGARD) 20 MG tablet TAKE 1/2 TABLET(10 MG) BY MOUTH DAILY  . venlafaxine XR (EFFEXOR-XR) 150 MG 24 hr capsule TAKE 1 CAPSULE(150 MG) BY MOUTH DAILY  . vitamin B-12 (CYANOCOBALAMIN) 100 MCG tablet Take 100 mcg by mouth daily.  [name] 20 MG TABS tablet TAKE 1 TABLET BY MOUTH DAILY WITH SUPPER   Current Facility-Administered Medications (Other)  Medication Route  . Bevacizumab (AVASTIN) SOLN 1.25 mg Intravitreal      REVIEW OF SYSTEMS: ROS    Positive for: Eyes   Negative for: Constitutional, Gastrointestinal, Neurological, Skin, Genitourinary, Musculoskeletal, HENT, Endocrine, Cardiovascular, Respiratory, Psychiatric, Allergic/Imm, Heme/Lymph   Last edited by Matthew Folks, COA on 10/10/2017 10:30 AM. (History)       ALLERGIES Allergies  Allergen Reactions  . Dyazide [Hydrochlorothiazide W-Triamterene]     Presumption: drug induced vasculitis  . Niacin Other (See Comments)    flushing  .  Penicillins Diarrhea and Nausea And Vomiting    PAST MEDICAL HISTORY Past Medical History:  Diagnosis Date  . Anemia    from medicaions  . Arthritis   . Chronic kidney disease    some renal impairment from meds  . Depression    states no depression  . Eczema   . Esophageal stricture   . GERD (gastroesophageal reflux disease)   . Headache   . Heart murmur    states it is benign told 30-40 years ago  . History of DVT (deep vein thrombosis)   . HX: breast cancer    melanoma  . Hyperlipidemia   . Hypertension   . Hypothyroidism   . Melanoma  ([place])   . OA (osteoarthritis)   . Personal history of chemotherapy 2016   Left Breast Cancer  . Personal history of radiation therapy 2016   Left Breast Cancer  . Pneumothorax on right 06/15/2014  . PONV (postoperative nausea and vomiting)   . Wears glasses    Past Surgical History:  Procedure Laterality Date  . APPENDECTOMY  1967  . BREAST EXCISIONAL BIOPSY Left   . BREAST LUMPECTOMY Left 2016  . BREAST SURGERY  1977   removal of calcified milk gland right breast  . CHEST TUBE INSERTION  06/15/2014  . CHEST TUBE INSERTION Bilateral 06/15/2014   Procedure: CHEST TUBE INSERTION;  Surgeon: Rolm Bookbinder, MD;  Location: [facility];  Service: General;  Laterality: Bilateral;  . COLONOSCOPY    . [place]   right breast  . MASTECTOMY Right 1998   right-nodes out  . MELANOMA EXCISION  2004   right side of face  . MOHS SURGERY  2005   right left-basal cell  . PORT-A-CATH REMOVAL N/A 10/05/2014   Procedure: REMOVAL PORT-A-CATH;  Surgeon: Rolm Bookbinder, MD;  Location: [facility];  Service: General;  Laterality: N/A;  . PORTACATH PLACEMENT Left 06/15/2014   Procedure: INSERTION PORT-A-CATH;  Surgeon: Rolm Bookbinder, MD;  Location: [facility];  Service: General;  Laterality: Left;  . RADIOACTIVE SEED GUIDED PARTIAL MASTECTOMY WITH AXILLARY SENTINEL LYMPH NODE BIOPSY Left 05/05/2014   Procedure: RADIOACTIVE SEED GUIDED LEFT LUMPECTOMY WITH AXILLARY SENTINEL LYMPH NODE BIOPSY;  Surgeon: Rolm Bookbinder, MD;  Location: [facility];  Service: General;  Laterality: Left;  . TONSILLECTOMY AND ADENOIDECTOMY  1949  . TOTAL ABDOMINAL HYSTERECTOMY  1992   including ovaries    FAMILY HISTORY Family History  Problem Relation Age of Onset  . Heart disease Father 7       smoker  . Heart attack Father   . Endometrial cancer Mother 39  . Breast cancer Cousin        x 3 cousins with breast cancer (one at 72, one at 70 and one at 17)  . Lung cancer Paternal  Aunt        smoker  . Brain cancer Maternal Aunt   . Diabetes Maternal Aunt        ???  . Breast cancer Maternal Aunt 51  . Healthy Sister   . Colon cancer Neg Hx   . Amblyopia Neg Hx   . Blindness Neg Hx   . Cataracts Neg Hx   . Glaucoma Neg Hx   . Macular degeneration Neg Hx   . Retinal detachment Neg Hx   . Strabismus Neg Hx   . Retinitis pigmentosa Neg Hx     SOCIAL HISTORY Social History   Tobacco Use  . Smoking status:  Former Smoker    Packs/day: 0.75    Years: 3.00    Pack years: 2.25    Types: Cigarettes    Last attempt to quit: 03/11/1969    Years since quitting: 48.6  . Smokeless tobacco: Never Used  Substance Use Topics  . Alcohol use: Yes    Alcohol/week: 0.0 oz    Comment: occasional wine  . Drug use: No         OPHTHALMIC EXAM:  Base Eye Exam    Visual Acuity (Snellen - Linear)      Right Left   Dist cc 20/20 -2 HM   Dist ph cc  NI   Correction:  Glasses       Tonometry (Tonopen, 10:36 AM)      Right Left   Pressure 12 10       Pupils      Dark Light Shape React APD   Right 4 2 Round Brisk None   Left 4 2 Round Brisk None       Visual Fields (Counting fingers)      Left Right     Full   Restrictions Total inferior nasal deficiency        Extraocular Movement      Right Left    Full, Ortho Full, Ortho       Neuro/Psych    Oriented x3:  Yes   Mood/Affect:  Normal       Dilation    Both eyes:  1.0% Mydriacyl, 2.5% Phenylephrine @ 10:36 AM        Slit Lamp and Fundus Exam    Slit Lamp Exam      Right Left   Lids/Lashes Dermatochalasis - upper lid, Telangiectasia Dermatochalasis - upper lid, Telangiectasia   Conjunctiva/Sclera White and quiet White and quiet   Cornea Arcus, Anterior basement membrane dystrophy, Temporal Well healed cataract wounds, nasal LRI Arcus, Anterior basement membrane dystrophy, Inferior 1+ Punctate epithelial erosions, Temporal Well healed cataract wounds   Anterior Chamber Deep and quiet Deep and  quiet   Iris Round and dilated Round and dilated   Lens Multifocal PC IOL in good position with open PC Multifocal PC IOL in good position with open PC   Vitreous Vitreous syneresis, Posterior vitreous detachment Vitreous syneresis       Fundus Exam      Right Left   Disc Pink and Sharp Pink and Sharp   C/D Ratio 0.45 0.45   Macula Blunted foveal reflex, mild Retinal pigment epithelial mottling, No heme or edema Massive sub-retinal hemorrhage along superior arcades and involving fovea, pre-retinal hemorrhage overlying and surrounding subretinal hemorrhage  -- mild interval improvement   Vessels Mild Vascular attenuation, trace AV crossing changes retinal macroaneurysm; Mild Vascular attenuation, trace AV crossing changes   Periphery Attached Attached          IMAGING AND PROCEDURES  Imaging and Procedures for @TODAY @  Intravitreal Injection, Pharmacologic Agent - OS - Left Eye       Time Out 10/10/2017. 11:10 AM. Confirmed correct patient, procedure, site, and patient consented.   Anesthesia Topical anesthesia was used. Anesthetic medications included Tetracaine 0.5%, Lidocaine 2%.   Procedure Preparation included 5% betadine to ocular surface, eyelid speculum. A 30 gauge needle was used.   Injection: 1.25 mg Bevacizumab 1.25mg /0.37ml   NDC: 16109-604-54    Lot: [id number]     Expiration Date: 12/11/2017   Route: Intravitreal   Site: Left Eye   Waste: 0 mg  Post-op Post  injection exam found visual acuity of at least counting fingers. The patient tolerated the procedure well. There were no complications. The patient received written and verbal post procedure care education.                 ASSESSMENT/PLAN:    ICD-10-CM   1. Subretinal hemorrhage of left eye H35.62 Intravitreal Injection, Pharmacologic Agent - OS - Left Eye    Bevacizumab (AVASTIN) SOLN 1.25 mg  2. 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 Retinal edema H35.81   4. Valsalva retinopathy  H35.00   5. Essential hypertension I10   6. Hypertensive retinopathy of both eyes H35.033   7. Pseudophakia of both eyes Z96.1   8. Exudative age-related macular degeneration of left eye with active choroidal neovascularization (HCC) H35.3221 Intravitreal Injection, Pharmacologic Agent - OS - Left Eye    Bevacizumab (AVASTIN) SOLN 1.25 mg    1-4. Retinal macroaneurysm with large sub-macular hemorrhage OS-  - onset of decreased vision associated with violent vomiting/wretching - on xarelto for history of DVT -- pt has stopped for now - exam shows mild improvement / evolution of large submacular hemorrhage with mild preretinal heme - retinal macroaneurysm now visible on exam and FA - differential also includes exudative ARMD -- see #8 below - recommend systematic work-up for hypertension and systemic vascular disease being initiated by Dr. Yong Channel - discussed findings and guarded prognosis and treatment options - pt here for IVA OS #1 today  - pt wishes to proceed - RBA of procedure discussed, questions answered - informed consent obtained and signed - see procedure note - F/U 4 wks  5,6. Hypertensive retinopathy OU - discussed importance of tight BP control - monitor  7. Pseudophakia OU  - s/p CE/IOL  - beautiful surgery, doing well  - monitor  8. Exudative age related macular degeneration, both eyes.    - The incidence pathology and anatomy of wet AMD discussed   - The ANCHOR, MARINA, CATT and VIEW trials discussed with patient.    - discussed treatment options including observation vs intravitreal anti-VEGF agents such as Avastin, Lucentis, Eylea.    - Risks of endophthalmitis and vascular occlusive events and atrophic changes discussed with patient  - pt wishes to be treated with IVA  - f/u in 4 wks, sooner prn   Ophthalmic Meds Ordered this visit:  Meds ordered this encounter  Medications  . Bevacizumab (AVASTIN) SOLN 1.25 mg       Return in about 1 month (around  11/07/2017) for Dilated Exam, OCT.  There are no Patient Instructions on file for this visit.   Explained the diagnoses, plan, and follow up with the patient and they expressed understanding.  Patient expressed understanding of the importance of proper follow up care.   This document serves as a record of services personally performed by Gardiner Sleeper, MD, PhD. It was created on their behalf by Catha Brow, [place], a certified ophthalmic assistant. The creation of this record is the provider's dictation and/or activities during the visit.  Electronically signed by: Catha Brow, [place]  08.01.19 11:40 PM     Gardiner Sleeper, M.D., Ph.D. Diseases & Surgery of the Retina and Vitreous [facility]    I have reviewed the above documentation for accuracy and completeness, and I agree with the above. Gardiner Sleeper, M.D., Ph.D. 10/12/17 11:43 PM     Abbreviations: M myopia (nearsighted); A astigmatism; H hyperopia (farsighted); P presbyopia; Mrx  spectacle prescription;  CTL contact lenses; OD right eye; OS left eye; OU both eyes  XT exotropia; ET esotropia; PEK punctate epithelial keratitis; PEE punctate epithelial erosions; DES dry eye syndrome; MGD meibomian gland dysfunction; ATs artificial tears; PFAT's preservative free artificial tears; [place] nuclear sclerotic cataract; PSC posterior subcapsular cataract; ERM epi-retinal membrane; PVD posterior vitreous detachment; RD retinal detachment; DM diabetes mellitus; DR diabetic retinopathy; NPDR non-proliferative diabetic retinopathy; PDR proliferative diabetic retinopathy; CSME clinically significant macular edema; DME diabetic macular edema; dbh dot blot hemorrhages; CWS cotton wool spot; POAG primary open angle glaucoma; C/D cup-to-disc ratio; HVF humphrey visual field; GVF goldmann visual field; OCT optical coherence tomography; IOP intraocular pressure; BRVO Branch retinal vein occlusion; CRVO central retinal vein  occlusion; CRAO central retinal artery occlusion; BRAO branch retinal artery occlusion; RT retinal tear; SB scleral buckle; PPV pars plana vitrectomy; VH Vitreous hemorrhage; PRP panretinal laser photocoagulation; IVK intravitreal kenalog; VMT vitreomacular traction; MH Macular hole;  NVD neovascularization of the disc; NVE neovascularization elsewhere; AREDS age related eye disease study; ARMD age related macular degeneration; POAG primary open angle glaucoma; EBMD epithelial/anterior basement membrane dystrophy; ACIOL anterior chamber intraocular lens; IOL intraocular lens; PCIOL posterior chamber intraocular lens; Phaco/IOL phacoemulsification with intraocular lens placement; [place] photorefractive keratectomy; LASIK laser assisted in situ keratomileusis; HTN hypertension; DM diabetes mellitus; COPD chronic obstructive pulmonary disease

## 2017-10-10 ENCOUNTER — Encounter (INDEPENDENT_AMBULATORY_CARE_PROVIDER_SITE_OTHER): Payer: Self-pay | Admitting: Ophthalmology

## 2017-10-10 ENCOUNTER — Ambulatory Visit (INDEPENDENT_AMBULATORY_CARE_PROVIDER_SITE_OTHER): Payer: Medicare Other | Admitting: Ophthalmology

## 2017-10-10 DIAGNOSIS — Z961 Presence of intraocular lens: Secondary | ICD-10-CM

## 2017-10-10 DIAGNOSIS — H3500 Unspecified background retinopathy: Secondary | ICD-10-CM

## 2017-10-10 DIAGNOSIS — I10 Essential (primary) hypertension: Secondary | ICD-10-CM

## 2017-10-10 DIAGNOSIS — H3562 Retinal hemorrhage, left eye: Secondary | ICD-10-CM | POA: Diagnosis not present

## 2017-10-10 DIAGNOSIS — H3581 Retinal edema: Secondary | ICD-10-CM

## 2017-10-10 DIAGNOSIS — H353221 Exudative age-related macular degeneration, left eye, with active choroidal neovascularization: Secondary | ICD-10-CM

## 2017-10-10 DIAGNOSIS — H35033 Hypertensive retinopathy, bilateral: Secondary | ICD-10-CM

## 2017-10-10 DIAGNOSIS — H35012 Changes in retinal vascular appearance, left eye: Secondary | ICD-10-CM

## 2017-10-10 MED ORDER — BEVACIZUMAB CHEMO INJECTION 1.25MG/0.05ML SYRINGE FOR KALEIDOSCOPE
1.2500 mg | INTRAVITREAL | Status: DC
  Administered 2017-10-10: 1.25 mg via INTRAVITREAL

## 2017-10-11 ENCOUNTER — Other Ambulatory Visit: Payer: Self-pay | Admitting: Family Medicine

## 2017-10-15 ENCOUNTER — Telehealth: Payer: Self-pay

## 2017-10-15 NOTE — Telephone Encounter (Signed)
-----   Message from Marin Olp, MD sent at 10/11/2017 11:12 AM EDT -----  Arloa Koh get her in for a follow up to help set up Dr. Dairl Ponder directions.   [name]  ----- Message ----- From: Bernarda Caffey, MD Sent: 10/10/2017   8:28 AM To: Marin Olp, MD  I would recommend carotid dopplers, EKG and echocardiogram.  Thanks, Aaron Edelman   ----- Message ----- From: Marin Olp, MD Sent: 10/09/2017   9:48 PM To: Bernarda Caffey, MD  Thanks for the follow up- what would you suggest the vascular work up should include?   [name]  ----- Message ----- From: Bernarda Caffey, MD Sent: 10/09/2017   8:30 AM To: Marin Olp, MD  Dr. Yong Channel,  Ms. Burtt has developed a large subretinal hemorrhage in her left eye that has severely decreased her vision. It is likely related to a combination of her HTN and history of xarelto use, but if she hasn't recently had a vascular work up, that would be recommended as her eye issue could portend a systemic issue. Thank you so much for your consideration and care of this patient.  Gardiner Sleeper, M.D., Ph.D. Diseases & Surgery of the Retina and [place] 10/09/17

## 2017-10-15 NOTE — Telephone Encounter (Signed)
Called and left a voicemail message asking for a return phone to schedule patient to see Dr. Yong Channel for vascular and hypertension work up as suggested by Dr. Ames Dura. I provided a telephone number for patient to return phone call and schedule visit.

## 2017-10-16 ENCOUNTER — Ambulatory Visit (INDEPENDENT_AMBULATORY_CARE_PROVIDER_SITE_OTHER): Payer: Medicare Other | Admitting: Family Medicine

## 2017-10-16 ENCOUNTER — Encounter: Payer: Self-pay | Admitting: Family Medicine

## 2017-10-16 VITALS — BP 128/82 | HR 63 | Temp 97.9°F | Ht 66.0 in | Wt 144.6 lb

## 2017-10-16 DIAGNOSIS — I82591 Chronic embolism and thrombosis of other specified deep vein of right lower extremity: Secondary | ICD-10-CM | POA: Diagnosis not present

## 2017-10-16 DIAGNOSIS — H3562 Retinal hemorrhage, left eye: Secondary | ICD-10-CM

## 2017-10-16 DIAGNOSIS — Z79899 Other long term (current) drug therapy: Secondary | ICD-10-CM

## 2017-10-16 DIAGNOSIS — R5383 Other fatigue: Secondary | ICD-10-CM | POA: Diagnosis not present

## 2017-10-16 DIAGNOSIS — F324 Major depressive disorder, single episode, in partial remission: Secondary | ICD-10-CM | POA: Diagnosis not present

## 2017-10-16 DIAGNOSIS — I480 Paroxysmal atrial fibrillation: Secondary | ICD-10-CM | POA: Diagnosis not present

## 2017-10-16 NOTE — Assessment & Plan Note (Signed)
Patient is off xarelto due to vision loss from subretinal hemorrhage. We considered doing another venous duplex to see if DVT has resolved but with vision loss wouldn't restart xarelto right now unless acute DVT so we discussed signs and symptoms to look out for.

## 2017-10-16 NOTE — Progress Notes (Signed)
Subjective:  Laura Li is a 77 y.o. year old very pleasant female patient who presents for/with See problem oriented charting ROS- No chest pain or shortness of breath. Occasional headache. Left eye with  blurry vision with known subretinal hemorrhage    Past Medical History-  Patient Active Problem List   Diagnosis Date Noted  . MCI (mild cognitive impairment) 10/16/2016    Priority: High  . Paroxysmal atrial fibrillation ([place]) 06/04/2016    Priority: High  . Chronic deep vein thrombosis (DVT) (HCC) 09/01/2015    Priority: High  . Pneumothorax 06/15/2014    Priority: High  . History of left breast cancer 04/24/2014    Priority: High  . History of right breast cancer 04/24/2014    Priority: High  . Vasculitis ([place]) 11/09/2012    Priority: High  . Depression, major, single episode, in partial remission ([place]) 04/24/2017    Priority: Medium  . Overactive bladder 09/19/2016    Priority: Medium  . Acute lower UTI 05/29/2016    Priority: Medium  . Osteopenia 05/23/2016    Priority: Medium  . Melanoma of skin ([place]) 03/31/2014    Priority: Medium  . Chronic kidney disease, stage III (moderate) ([place]) 12/09/2012    Priority: Medium  . 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    Priority: Medium  . Hypothyroidism 02/06/2006    Priority: Medium  . Hyperlipidemia 02/06/2006    Priority: Medium  . Essential hypertension 02/06/2006    Priority: Medium  . Localized swelling, mass, or lump of lower extremity 11/05/2014    Priority: Low  . Diverticulitis 08/09/2014    Priority: Low  . Family history of breast cancer 04/29/2014    Priority: Low  . GERD (gastroesophageal reflux disease) 03/31/2014    Priority: Low  . Hot flashes 03/31/2014    Priority: Low  . Osteoarthritis 03/31/2014    Priority: Low  . Allergic rhinitis 02/06/2006    Priority: Low  . Disorder of bone and cartilage 05/23/2017    Medications- reviewed and updated Current Outpatient Medications  Medication  Sig Dispense Refill  . acetaminophen (TYLENOL) 500 MG tablet Take 500 mg by mouth 2 (two) times daily as needed for mild pain. Reported on 07/07/2015    . atorvastatin (LIPITOR) 20 MG tablet Take 1 tablet (20 mg total) by mouth daily. 90 tablet 3  . B Complex-C-E-Zn (B COMPLEX-C-E-ZINC) tablet Take 1 tablet by mouth daily.    . Calcium-Vitamin D (CALTRATE 600 PLUS-VIT D PO) Take 1 tablet by mouth 2 (two) times daily.     . carboxymethylcellulose (REFRESH PLUS) 0.5 % SOLN 2 drops 3 (three) times daily as needed.    [name] esomeprazole (NEXIUM) 20 MG capsule TAKE 1 BY MOUTH DAILY 90 capsule 3  . fexofenadine (ALLEGRA) 180 MG tablet Take 180 mg by mouth daily as needed for allergies.     [name] ipratropium (ATROVENT) 0.03 % nasal spray Place 2 sprays into the nose every 12 (twelve) hours. 180 mL 3  . Lactobacillus-Inulin (CULTURELLE DIGESTIVE HEALTH PO) Take 1 capsule by mouth daily.    [name] levothyroxine (SYNTHROID, LEVOTHROID) 88 MCG tablet TAKE 1 TABLET(88 MCG) BY MOUTH DAILY 90 tablet 3  . Multiple Vitamins-Minerals (CENTRUM SILVER PO) Take 1 tablet by mouth daily.    . nadolol (CORGARD) 20 MG tablet TAKE 1/2 TABLET(10 MG) BY MOUTH DAILY 45 tablet 2  . Propylene Glycol (SYSTANE BALANCE) 0.6 % SOLN Apply 1-2 drops to eye daily as needed (dry eyes).     [name]  venlafaxine XR (EFFEXOR-XR) 150 MG 24 hr capsule TAKE 1 CAPSULE(150 MG) BY MOUTH DAILY 90 capsule 3  . vitamin B-12 (CYANOCOBALAMIN) 100 MCG tablet Take 100 mcg by mouth daily.     Current Facility-Administered Medications  Medication Dose Route Frequency Provider Last Rate Last Dose  . Bevacizumab (AVASTIN) SOLN 1.25 mg  1.25 mg Intravitreal  Bernarda Caffey, MD   1.25 mg at 10/10/17 1318    Objective: BP 128/82 (BP Location: Left Arm, Patient Position: Sitting, Cuff Size: Normal)   Pulse 63   Temp 97.9 F (36.6 C) (Oral)   Ht 5\' 6"  (1.676 m)   Wt 144 lb 9.6 oz (65.6 kg)   SpO2 97%   BMI 23.34 kg/m  Gen: NAD, resting comfortably CV: RRR no murmurs  rubs or gallops Lungs: CTAB no crackles, wheeze, rhonchi Abdomen: soft/nontender/nondistended Ext: no edema Skin: warm, dry  EKG: sinus rhythm with rate63, left axis, normal intervals, LVH based on large R wave in aVL. Anterior fasicular block noted. No significant st or t wave changes. Unchanged from EKG 04/23/17 with cardiology Assessment/Plan:  Subretinal hemorrhage of left eye - Plan: EKG 12-Lead S: Patient with subretinal hemorrhage. Her ophthalmologist recommended EKG, echo, carotid duplex.   EKG completed today. Reviewed carotid dopplers within 6 months from cardiology which showed per Dr. Lovena Le "Very mild plaquing/narrow of both carotid arteries. Low fat diet, blood pressure control, dont smoke. No need to repeat in absence of symptyoms for 5 years.". When I look at report it shows 1-39% bilaterally.   She had an echocardiogram last year which shows EF 81%, grade 1 diastolic dysfunction and otherwise normal.  A/P: has had reasonable vascular workup within 6 months as requested by optho- we reviewed these findings and no obvious cause and no further workup advised at this time   Fatigue, unspecified type - Plan: CBC, Comprehensive metabolic panel, TSH, Vitamin B12 S: having some fatigue which she wonders if it is from [place] labs A/P: update cbc, cmp, tsh, b12 (on PPI)  Depression, major, single episode, in partial remission (HCC) S: compliant with venlafaxine 150mg  XR> phq9 has improved to 4 today without depressed mood or anhedonia A/P:improved control- continue current rx  Chronic deep vein thrombosis (DVT) ([place]) Patient is off xarelto due to vision loss from subretinal hemorrhage. We considered doing another venous duplex to see if DVT has resolved but with vision loss wouldn't restart xarelto right now unless acute DVT so we discussed signs and symptoms to look out for.   Paroxysmal atrial fibrillation (HCC) Sounds to be in NSR  though cannot prove without EKG. On nadolol 10mg  still. 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    Future Appointments  Date Time Provider [place]  11/07/2017 10:00 AM Bernarda Caffey, MD [facility] None  11/24/2017 10:00 AM Marin Olp, MD [facility]  05/25/2018 10:30 AM Philemon Kingdom, MD [facility] None  07/02/2018  1:30 PM [facility] 1 [facility] None  07/02/2018  2:00 PM Magrinat, Virgie Dad, MD [facility] None  07/23/2018  3:30 PM Dohmeier, Asencion Partridge, MD [facility] None   No follow-ups on file.  Lab/Order associations: Subretinal hemorrhage of left eye - Plan: EKG 12-Lead  Fatigue, unspecified type - Plan: CBC, Comprehensive metabolic panel, TSH, Vitamin B12  High risk medication use - Plan: Vitamin B12  Depression, major, single episode, in partial remission (HCC)  MCI (mild cognitive  impairment)  Chronic deep vein thrombosis (DVT) of other vein of right lower extremity (HCC)  Paroxysmal atrial fibrillation ([place])  Return precautions advised.  [name], MD

## 2017-10-16 NOTE — Patient Instructions (Addendum)
Health Maintenance Due  Topic Date Due  . INFLUENZA VACCINE - Please schedule in fall 10/09/2017   . I would also like for you to sign up for an annual wellness visit with one of our nurses, Cassie or Manuela Schwartz, who both specialize in the annual wellness visit. This is a free benefit under medicare that may help [place] find additional ways to help you. Some highlights are reviewing medications, lifestyle, and doing a dementia screen.   . Please check with your pharmacy to see if they have the shingrix vaccine. If they do- please get this immunization and update [place] by phone call or mychart with dates you receive the vaccine.    Please stop by lab before you go Fatigue could be due to stress of moving but lets make sure with bloodwork.   As far as vascular workup that Dr. Coralyn Pear wanted 1. EKG looks stable 2. Just had carotids checked in February 3. Just had echocardiogram last year which looked good I dont think you need further workup at this time.   Off xarelto there is some risk of new blood clot- make sure to remain active and let us know if you have leg swelling/pain or shortness of breath. There is also increased risk of stroke but I completely agree that trying to restore vision is important as well.

## 2017-10-16 NOTE — Assessment & Plan Note (Signed)
S: compliant with venlafaxine 150mg  XR> phq9 has improved to 4 today without depressed mood or anhedonia A/P:improved control- continue current rx

## 2017-10-16 NOTE — Assessment & Plan Note (Signed)
Sounds to be in NSR though cannot prove without EKG. On nadolol 10mg  still. 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

## 2017-10-17 LAB — COMPREHENSIVE METABOLIC PANEL
ALT: 12 U/L (ref 0–35)
AST: 18 U/L (ref 0–37)
Albumin: 4.4 g/dL (ref 3.5–5.2)
Alkaline Phosphatase: 71 U/L (ref 39–117)
BUN: 22 mg/dL (ref 6–23)
CO2: 29 meq/L (ref 19–32)
Calcium: 10 mg/dL (ref 8.4–10.5)
Chloride: 100 mEq/L (ref 96–112)
Creatinine, Ser: 1.03 mg/dL (ref 0.40–1.20)
GFR: 54.95 mL/min — ABNORMAL LOW (ref >60.00)
GLUCOSE: 90 mg/dL (ref 70–99)
POTASSIUM: 4.6 meq/L (ref 3.5–5.1)
Sodium: 138 mEq/L (ref 135–145)
TOTAL PROTEIN: 7.4 g/dL (ref 6.0–8.3)
Total Bilirubin: 0.4 mg/dL (ref 0.2–1.2)

## 2017-10-17 LAB — CBC
HEMATOCRIT: 36.2 % (ref 36.0–46.0)
Hemoglobin: 12.8 g/dL (ref 12.0–15.0)
MCHC: 35.4 g/dL (ref 30.0–36.0)
MCV: 89.7 fl (ref 78.0–100.0)
Platelets: 218 10*3/uL (ref 150.0–400.0)
RBC: 4.04 Mil/uL (ref 3.87–5.11)
RDW: 12.9 % (ref 11.5–15.5)
WBC: 4.9 10*3/uL (ref 4.0–10.5)

## 2017-10-17 LAB — VITAMIN B12: Vitamin B-12: 857 pg/mL (ref 211–911)

## 2017-10-17 LAB — TSH: TSH: 0.54 u[IU]/mL (ref 0.35–4.50)

## 2017-11-04 ENCOUNTER — Encounter: Payer: Self-pay | Admitting: Family Medicine

## 2017-11-04 ENCOUNTER — Ambulatory Visit (INDEPENDENT_AMBULATORY_CARE_PROVIDER_SITE_OTHER): Payer: Medicare Other

## 2017-11-04 ENCOUNTER — Ambulatory Visit (INDEPENDENT_AMBULATORY_CARE_PROVIDER_SITE_OTHER): Payer: Medicare Other | Admitting: Family Medicine

## 2017-11-04 VITALS — BP 112/70 | HR 68 | Temp 97.6°F | Ht 66.0 in | Wt 144.5 lb

## 2017-11-04 VITALS — BP 138/84 | HR 64 | Ht 66.0 in | Wt 143.0 lb

## 2017-11-04 DIAGNOSIS — R32 Unspecified urinary incontinence: Secondary | ICD-10-CM | POA: Diagnosis not present

## 2017-11-04 DIAGNOSIS — Z0000 Encounter for general adult medical examination without abnormal findings: Secondary | ICD-10-CM | POA: Diagnosis not present

## 2017-11-04 DIAGNOSIS — N3281 Overactive bladder: Secondary | ICD-10-CM

## 2017-11-04 LAB — POCT URINALYSIS DIPSTICK
Bilirubin, UA: NEGATIVE
Glucose, UA: NEGATIVE
Ketones, UA: NEGATIVE
LEUKOCYTES UA: NEGATIVE
NITRITE UA: NEGATIVE
PROTEIN UA: NEGATIVE
RBC UA: NEGATIVE
Spec Grav, UA: 1.02 (ref 1.010–1.025)
Urobilinogen, UA: 0.2 E.U./dL
pH, UA: 5.5 (ref 5.0–8.0)

## 2017-11-04 MED ORDER — MIRABEGRON ER 25 MG PO TB24: 25.0000 mg | ORAL_TABLET | Freq: Every day | ORAL | 5 refills | Status: DC

## 2017-11-04 NOTE — Patient Instructions (Addendum)
Please stop by lab before you go- need a urine sample to make sure no infection  dont start myrbetriq yet- only start if no infection. I know you wanted to wait some more time anyway.   If there is an infection- we will start by trying to treat that before starting myrbetriq  If you do start the medicine- come see [place] 2 weeks later to check on blood pressure.   Follow up for new or worsening symptoms

## 2017-11-04 NOTE — Patient Instructions (Addendum)
Laura Li , Thank you for taking time to come for your Medicare Wellness Visit. I appreciate your ongoing commitment to your health goals. Please review the following plan we discussed and let me know if I can assist you in the future.   These are the goals we discussed: Goals    . Exercise 150 minutes per week (moderate activity)     Goal is to find a rhythm of exercise you enjoy and put on it on your calender Start low and slow  Y or other with a walking trial;  Has a program at [employer] that physiology students did work with older adults     . patient     Rethinking long term care plan [employer]; [phone] Senior Directory; Information regarding Long Term Care  Visit places at different; You can call and go there for lunch'         This is a list of the screening recommended for you and due dates:  Health Maintenance  Topic Date Due  . Flu Shot  10/09/2017  . Tetanus Vaccine  03/19/2020  . Pneumonia vaccines  Completed  . DEXA scan (bone density measurement)  Addressed    Preventive Care for Adults  A healthy lifestyle and preventive care can promote health and wellness. Preventive health guidelines for adults include the following key practices.  . A routine yearly physical is a good way to check with your health care provider about your health and preventive screening. It is a chance to share any concerns and updates on your health and to receive a thorough exam.  . Visit your dentist for a routine exam and preventive care every 6 months. Brush your teeth twice a day and floss once a day. Good oral hygiene prevents tooth decay and gum disease.  . The frequency of eye exams is based on your age, health, family medical history, use  of contact lenses, and other factors. Follow your health care provider's recommendations for frequency of eye exams.  . Eat a healthy diet. Foods like vegetables, fruits, whole grains, low-fat  dairy products, and lean protein foods contain the nutrients you need without too many calories. Decrease your intake of foods high in solid fats, added sugars, and salt. Eat the right amount of calories for you. Get information about a proper diet from your health care provider, if necessary.  . Regular physical exercise is one of the most important things you can do for your health. Most adults should get at least 150 minutes of moderate-intensity exercise (any activity that increases your heart rate and causes you to sweat) each week. In addition, most adults need muscle-strengthening exercises on 2 or more days a week.  Silver Sneakers may be a benefit available to you. To determine eligibility, you may visit the website: www.silversneakers.com or contact program at (440)022-6280 Mon-Fri between 8AM-8PM.   . Maintain a healthy weight. The body mass index (BMI) is a screening tool to identify possible weight problems. It provides an estimate of body fat based on height and weight. Your health care provider can find your BMI and can help you achieve or maintain a healthy weight.   For adults 20 years and older: ? A BMI below 18.5 is considered underweight. ? A BMI of 18.5 to 24.9 is normal. ? A BMI of 25 to 29.9 is considered overweight. ? A BMI of 30 and above is considered obese.   . Maintain normal blood lipids and cholesterol levels  by exercising and minimizing your intake of saturated fat. Eat a balanced diet with plenty of fruit and vegetables. Blood tests for lipids and cholesterol should begin at age 67 and be repeated every 5 years. If your lipid or cholesterol levels are high, you are over 50, or you are at high risk for heart disease, you may need your cholesterol levels checked more frequently. Ongoing high lipid and cholesterol levels should be treated with medicines if diet and exercise are not working.  . If you smoke, find out from your health care provider how to quit. If you do  not use tobacco, please do not start.  . If you choose to drink alcohol, please do not consume more than 2 drinks per day. One drink is considered to be 12 ounces (355 mL) of beer, 5 ounces (148 mL) of wine, or 1.5 ounces (44 mL) of liquor.  . If you are 61-106 years old, ask your health care provider if you should take aspirin to prevent strokes.  . Use sunscreen. Apply sunscreen liberally and repeatedly throughout the day. You should seek shade when your shadow is shorter than you. Protect yourself by wearing long sleeves, pants, a wide-brimmed hat, and sunglasses year round, whenever you are outdoors.  . Once a month, do a whole body skin exam, using a mirror to look at the skin on your back. Tell your health care provider of new moles, moles that have irregular borders, moles that are larger than a pencil eraser, or moles that have changed in shape or color.

## 2017-11-04 NOTE — Progress Notes (Signed)
Subjective:  VERTIE DIBBERN is a 77 y.o. year old very pleasant female patient who presents for/with See problem oriented charting ROS- no fever or chills. No dysuria. Incontinence worse in afternoon. No recent falls or balance issues    Past Medical History-  Patient Active Problem List   Diagnosis Date Noted  . MCI (mild cognitive impairment) 10/16/2016    Priority: High  . Paroxysmal atrial fibrillation ([place]) 06/04/2016    Priority: High  . Chronic deep vein thrombosis (DVT) ([facility]) 09/01/2015    Priority: High  . Pneumothorax 06/15/2014    Priority: High  . History of left breast cancer 04/24/2014    Priority: High  . History of right breast cancer 04/24/2014    Priority: High  . Vasculitis ([place]) 11/09/2012    Priority: High  . Depression, major, single episode, in partial remission ([place]) 04/24/2017    Priority: Medium  . Overactive bladder 09/19/2016    Priority: Medium  . Acute lower UTI 05/29/2016    Priority: Medium  . Osteopenia 05/23/2016    Priority: Medium  . Melanoma of skin ([place]) 03/31/2014    Priority: Medium  . Chronic kidney disease, stage III (moderate) ([place]) 12/09/2012    Priority: Medium  . 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    Priority: Medium  . Hypothyroidism 02/06/2006    Priority: Medium  . Hyperlipidemia 02/06/2006    Priority: Medium  . Essential hypertension 02/06/2006    Priority: Medium  . Localized swelling, mass, or lump of lower extremity 11/05/2014    Priority: Low  . Diverticulitis 08/09/2014    Priority: Low  . Family history of breast cancer 04/29/2014    Priority: Low  . GERD (gastroesophageal reflux disease) 03/31/2014    Priority: Low  . Hot flashes 03/31/2014    Priority: Low  . Osteoarthritis 03/31/2014    Priority: Low  . Allergic rhinitis 02/06/2006    Priority: Low  . Disorder of bone and cartilage 05/23/2017    Medications- reviewed and updated Current Outpatient Medications  Medication Sig Dispense Refill   . acetaminophen (TYLENOL) 500 MG tablet Take 500 mg by mouth 2 (two) times daily as needed for mild pain. Reported on 07/07/2015    . atorvastatin (LIPITOR) 20 MG tablet Take 1 tablet (20 mg total) by mouth daily. 90 tablet 3  . B Complex-C-E-Zn (B COMPLEX-C-E-ZINC) tablet Take 1 tablet by mouth daily.    . Calcium-Vitamin D (CALTRATE 600 PLUS-VIT D PO) Take 1 tablet by mouth 2 (two) times daily.     . carboxymethylcellulose (REFRESH PLUS) 0.5 % SOLN 2 drops 3 (three) times daily as needed.    [name] esomeprazole (NEXIUM) 20 MG capsule TAKE 1 BY MOUTH DAILY 90 capsule 3  . fexofenadine (ALLEGRA) 180 MG tablet Take 180 mg by mouth daily as needed for allergies.     [name] ipratropium (ATROVENT) 0.03 % nasal spray Place 2 sprays into the nose every 12 (twelve) hours. 180 mL 3  . Lactobacillus-Inulin (CULTURELLE DIGESTIVE HEALTH PO) Take 1 capsule by mouth daily.    [name] levothyroxine (SYNTHROID, LEVOTHROID) 88 MCG tablet TAKE 1 TABLET(88 MCG) BY MOUTH DAILY 90 tablet 3  . Multiple Vitamins-Minerals (CENTRUM SILVER PO) Take 1 tablet by mouth daily.    . nadolol (CORGARD) 20 MG tablet TAKE 1/2 TABLET(10 MG) BY MOUTH DAILY 45 tablet 2  . Propylene Glycol (SYSTANE BALANCE) 0.6 % SOLN Apply 1-2 drops to eye daily as needed (dry eyes).     . venlafaxine XR (  EFFEXOR-XR) 150 MG 24 hr capsule TAKE 1 CAPSULE(150 MG) BY MOUTH DAILY 90 capsule 3  . vitamin B-12 (CYANOCOBALAMIN) 100 MCG tablet Take 100 mcg by mouth daily.     Current Facility-Administered Medications  Medication Dose Route Frequency Provider Last Rate Last Dose  . Bevacizumab (AVASTIN) SOLN 1.25 mg  1.25 mg Intravitreal  Bernarda Caffey, MD   1.25 mg at 10/10/17 1318    Objective: BP 112/70 (BP Location: Left Arm, Patient Position: Sitting, Cuff Size: Normal)   Pulse 68   Temp 97.6 F (36.4 C) (Oral)   Ht 5\' 6"  (1.676 m)   Wt 144 lb 8 oz (65.5 kg)   SpO2 97%   BMI 23.32 kg/m  Gen: NAD, resting comfortably CV: RRR no murmurs rubs or  gallops Lungs: CTAB no crackles, wheeze, rhonchi Abdomen: soft/nontender including no suprapubic tenderness/nondistended/normal bowel sounds.  Ext: no edema Skin: warm, dry, no rash over ankles  Assessment/Plan:  Other notes: 1. 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 She will take the test and see how she does. Hopeful vision will have further improvement   Overactive bladder S: incontinence for at least 2 years. Has gotten worse recently she thinks. Still using depedz- changing once a day. Has urgency at times and as to go right away, other times doesn't really even feel it and urinates. No dysuria. No fever/chills. No fecal incontinence. No weakness in legs. No falls since christmas. Doesn't feel imbalanced walking (doubt NPH) . Symptoms worse in the afternoon or evening.  A/P: we had a long discussion today about incontinence and what sounds primarily like OAB related incontinence, she denies issues with coughing/sneezing. With worsening symptoms- get UA (normal) and culture to make sure no infection. On one hand she is interested in meds- on the other she just wants to deal with eh incontinence. I offered referral to urology with Dr. McDiarmid as well which she declines.   From avs  "dont start myrbetriq yet- only start if no infection. I know you wanted to wait some more time anyway.   If there is an infection- we will start by trying to treat that before starting myrbetriq  If you do start the medicine- come see [place] 2 weeks later to check on blood pressure.   Follow up for new or worsening symptoms "   Future Appointments  Date Time Provider [place]  11/07/2017 10:00 AM Bernarda Caffey, MD [facility] None  11/24/2017 10:00 AM Marin Olp, MD [facility]  05/25/2018 10:30 AM Philemon Kingdom, MD [facility] None  07/02/2018  1:30 PM [facility] 1 [facility] None  07/02/2018  2:00 PM Magrinat, Virgie Dad,  MD [facility] None  07/23/2018  3:30 PM Dohmeier, Asencion Partridge, MD [facility] None  12/15/2018 11:00 AM [facility] HEALTH COACH [facility]   Lab/Order associations: Urinary incontinence, unspecified type - Plan: POCT urinalysis dipstick, Urine Culture  Overactive bladder  Meds ordered this encounter  Medications  . mirabegron ER (MYRBETRIQ) 25 MG TB24 tablet    Sig: Take 1 tablet (25 mg total) by mouth daily.    Dispense:  30 tablet    Refill:  5    Return precautions advised.  [name], MD

## 2017-11-04 NOTE — Progress Notes (Signed)
Subjective:   Laura Li is a 77 y.o. female who presents for Medicare Annual (Subsequent) preventive examination.  Review of Systems:  No ROS.  Medicare Wellness Visit. Additional risk factors are reflected in the social history. Cardiac Risk Factors include: advanced age (>62men, >79 women) Patient is a widow who recently moved into [facility]. She is living in an apartment. She has been busy unpacking and working to get settled in. Enjoys art, socializing, enjoys reading (discussed audiobooks), enjoys playing the piano.   Goes to bed around 11-11:30. Gets up 2-3 times to go to the bathroom a night. Gets up around 9:30-10 at the latest. Feels rested when she wakes up in the morning. Does not use a CPAP.    Objective:     Vitals: BP 138/84 (BP Location: Left Arm, Patient Position: Sitting, Cuff Size: Large)   Pulse 64   Ht 5\' 6"  (1.676 m)   Wt 143 lb (64.9 kg)   SpO2 98%   BMI 23.08 kg/m   Body mass index is 23.08 kg/m.  Advanced Directives 11/04/2017 10/18/2016 05/29/2016 05/28/2016 12/26/2015 10/06/2015 06/26/2015  Does Patient Have a Medical Advance Directive? Yes Yes Yes Yes Yes Yes Yes  Type of Paramedic of [place];Living will - [place]  Does patient want to make changes to medical advance directive? No - Patient declined - No - Patient declined - - - -  Copy of [place] in Chart? No - copy requested - No - copy requested No - copy requested - - -    Tobacco Social History   Tobacco Use  Smoking Status Former Smoker  . Packs/day: 0.75  . Years: 3.00  . Pack years: 2.25  . Types: Cigarettes  . Last attempt to quit: 03/11/1969  . Years since quitting: 48.6  Smokeless Tobacco Never Used     Counseling given: Not Answered      Past Medical History:  Diagnosis Date  . Anemia    from medicaions  . Arthritis   . Chronic kidney  disease    some renal impairment from meds  . Depression    states no depression  . Eczema   . Esophageal stricture   . GERD (gastroesophageal reflux disease)   . Headache   . Heart murmur    states it is benign told 30-40 years ago  . History of DVT (deep vein thrombosis)   . HX: breast cancer    melanoma  . Hyperlipidemia   . Hypertension   . Hypothyroidism   . Melanoma ([place])   . OA (osteoarthritis)   . Personal history of chemotherapy 2016   Left Breast Cancer  . Personal history of radiation therapy 2016   Left Breast Cancer  . Pneumothorax on right 06/15/2014  . PONV (postoperative nausea and vomiting)   . Wears glasses    Past Surgical History:  Procedure Laterality Date  . APPENDECTOMY  1967  . BREAST EXCISIONAL BIOPSY Left   . BREAST LUMPECTOMY Left 2016  . BREAST SURGERY  1977   removal of calcified milk gland right breast  . CHEST TUBE INSERTION  06/15/2014  . CHEST TUBE INSERTION Bilateral 06/15/2014   Procedure: CHEST TUBE INSERTION;  Surgeon: Rolm Bookbinder, MD;  Location: [facility];  Service: General;  Laterality: Bilateral;  . COLONOSCOPY    . LIPOMA EXCISION  1978   right breast  . MASTECTOMY Right 1998  right-nodes out  . MELANOMA EXCISION  2004   right side of face  . MOHS SURGERY  2005   right left-basal cell  . PORT-A-CATH REMOVAL N/A 10/05/2014   Procedure: REMOVAL PORT-A-CATH;  Surgeon: Rolm Bookbinder, MD;  Location: [facility];  Service: General;  Laterality: N/A;  . PORTACATH PLACEMENT Left 06/15/2014   Procedure: INSERTION PORT-A-CATH;  Surgeon: Rolm Bookbinder, MD;  Location: [facility];  Service: General;  Laterality: Left;  . RADIOACTIVE SEED GUIDED PARTIAL MASTECTOMY WITH AXILLARY SENTINEL LYMPH NODE BIOPSY Left 05/05/2014   Procedure: RADIOACTIVE SEED GUIDED LEFT LUMPECTOMY WITH AXILLARY SENTINEL LYMPH NODE BIOPSY;  Surgeon: Rolm Bookbinder, MD;  Location: [facility];  Service: General;  Laterality: Left;  .  TONSILLECTOMY AND ADENOIDECTOMY  1949  . TOTAL ABDOMINAL HYSTERECTOMY  1992   including ovaries   Family History  Problem Relation Age of Onset  . Heart disease Father 55       smoker  . Heart attack Father   . Endometrial cancer Mother 77  . Breast cancer Cousin        x 3 cousins with breast cancer (one at 49, one at 66 and one at 65)  . Lung cancer Paternal Aunt        smoker  . Brain cancer Maternal Aunt   . Diabetes Maternal Aunt        ???  . Breast cancer Maternal Aunt 51  . Healthy Sister   . Tuberculosis Maternal Grandfather   . Cervical cancer Paternal Grandmother   . Healthy Daughter   . Colon cancer Neg Hx   . Amblyopia Neg Hx   . Blindness Neg Hx   . Cataracts Neg Hx   . Glaucoma Neg Hx   . Macular degeneration Neg Hx   . Retinal detachment Neg Hx   . Strabismus Neg Hx   . Retinitis pigmentosa Neg Hx    Social History   Socioeconomic History  . Marital status: Widowed    Spouse name: Not on file  . Number of children: Not on file  . Years of education: Not on file  . Highest education level: Not on file  Occupational History  . Occupation: retired  Scientific laboratory technician  . Financial resource strain: Not on file  . Food insecurity:    Worry: Not on file    Inability: Not on file  . Transportation needs:    Medical: Not on file    Non-medical: Not on file  Tobacco Use  . Smoking status: Former Smoker    Packs/day: 0.75    Years: 3.00    Pack years: 2.25    Types: Cigarettes    Last attempt to quit: 03/11/1969    Years since quitting: 48.6  . Smokeless tobacco: Never Used  Substance and Sexual Activity  . Alcohol use: Yes    Alcohol/week: 0.0 standard drinks    Comment: occasional wine  . Drug use: No  . Sexual activity: Not Currently    Birth control/protection: None  Lifestyle  . Physical activity:    Days per week: Not on file    Minutes per session: Not on file  . Stress: Not on file  Relationships  . Social connections:    Talks on phone:  Not on file    Gets together: Not on file    Attends religious service: Not on file    Active member of club or organization: Not on file    Attends meetings of  clubs or organizations: Not on file    Relationship status: Not on file  Other Topics Concern  . Not on file  Social History Narrative   Widowed 2003. Moved to [place] from [place] in 2004 after loss of husband to be near daughter. 1 daughter. 2 grandkids.       Retired from working at a church      Hobbies: artist    Outpatient Encounter Medications as of 11/04/2017  Medication Sig  . acetaminophen (TYLENOL) 500 MG tablet Take 500 mg by mouth 2 (two) times daily as needed for mild pain. Reported on 07/07/2015  . atorvastatin (LIPITOR) 20 MG tablet Take 1 tablet (20 mg total) by mouth daily.  . B Complex-C-E-Zn (B COMPLEX-C-E-ZINC) tablet Take 1 tablet by mouth daily.  . Calcium-Vitamin D (CALTRATE 600 PLUS-VIT D PO) Take 1 tablet by mouth 2 (two) times daily.   . carboxymethylcellulose (REFRESH PLUS) 0.5 % SOLN 2 drops 3 (three) times daily as needed.  [name] esomeprazole (NEXIUM) 20 MG capsule TAKE 1 BY MOUTH DAILY  . fexofenadine (ALLEGRA) 180 MG tablet Take 180 mg by mouth daily as needed for allergies.   [name] ipratropium (ATROVENT) 0.03 % nasal spray Place 2 sprays into the nose every 12 (twelve) hours.  . Lactobacillus-Inulin (CULTURELLE DIGESTIVE HEALTH PO) Take 1 capsule by mouth daily.  [name] levothyroxine (SYNTHROID, LEVOTHROID) 88 MCG tablet TAKE 1 TABLET(88 MCG) BY MOUTH DAILY  . Multiple Vitamins-Minerals (CENTRUM SILVER PO) Take 1 tablet by mouth daily.  . nadolol (CORGARD) 20 MG tablet TAKE 1/2 TABLET(10 MG) BY MOUTH DAILY  . Propylene Glycol (SYSTANE BALANCE) 0.6 % SOLN Apply 1-2 drops to eye daily as needed (dry eyes).   . venlafaxine XR (EFFEXOR-XR) 150 MG 24 hr capsule TAKE 1 CAPSULE(150 MG) BY MOUTH DAILY  . vitamin B-12 (CYANOCOBALAMIN) 100 MCG tablet Take 100 mcg by mouth daily.   Facility-Administered Encounter  Medications as of 11/04/2017  Medication  . Bevacizumab (AVASTIN) SOLN 1.25 mg    Activities of Daily Living In your present state of health, do you have any difficulty performing the following activities: 11/04/2017  Hearing? N  Vision? N  Difficulty concentrating or making decisions? N  Walking or climbing stairs? N  Dressing or bathing? N  Doing errands, shopping? N  Preparing Food and eating ? N  Comment Meals are prepared in the dining room   Using the Toilet? N  In the past six months, have you accidently leaked urine? Y  Comment Wears a pad all the time  Do you have problems with loss of bowel control? N  Managing your Medications? N  Managing your Finances? N  Housekeeping or managing your Housekeeping? N  Comment Recently moved to [place], has someone that comes in 1 tme a week  Some recent data might be hidden    Patient Care Team: Marin Olp, MD as PCP - General (Family Medicine) Rolm Bookbinder, MD as Consulting Physician (General Surgery) Thea Silversmith, MD as Consulting Physician (Radiation Oncology) Magrinat, Virgie Dad, MD as Consulting Physician (Oncology) Sylvan Cheese, NP as Nurse Practitioner (Hematology and Oncology)    Assessment:   This is a routine wellness examination for Ta.  Exercise Activities and Dietary recommendations Current Exercise Habits: The patient does not participate in regular exercise at present(Used to be at the [facility], still unpacking, will join programs as she gets settled)  Recently moved to [employer]. Meals are in the dining room. Menu to order from.  Breakfast: Fruit,  toast, cereal, orange juice and water, occasional coffee  Lunch: Meat and 2 vegetables, desert with ice cream  Dinner: Beef, salad, unsweet iced tea Goals    . Exercise 150 minutes per week (moderate activity)     Goal is to find a rhythm of exercise you enjoy and put on it on your calender Start low and slow  Y or other with a  walking trial;  Has a program at [facility] [employer] that physiology students did work with older adults     . patient     Rethinking long term care plan [employer]; [phone] Senior Directory; Information regarding Long Term Care  Visit places at different; You can call and go there for lunch'         Fall Risk Fall Risk  11/04/2017 07/21/2017 10/18/2016 10/18/2016 10/18/2016  Falls in the past year? Yes Yes Yes No No  Comment - - [place] - -  Number falls in past yr: 2 or more 2 or more 1 - -  Comment tripped at a friends house - - - -  Injury with Fall? Yes Yes - - -  Comment - - - - -  Risk for fall due to : - - Impaired balance/gait - -  Follow up - Falls prevention discussed - - -     Depression Screen PHQ 2/9 Scores 11/04/2017 10/16/2017 04/24/2017 10/18/2016  PHQ - 2 Score 1 0 3 2  PHQ- 9 Score 5 4 8 7      Cognitive Function MMSE - Mini Mental State Exam 10/18/2016 10/06/2015  Not completed: Refused -  Orientation to time 4 4  Orientation to Place 5 5  Registration 3 3  Attention/ Calculation 3 5  Attention/Calculation-comments serial 3 from 20; (1 serials 7 from 100) -  Recall 3 2  Language- name 2 objects 2 2  Language- repeat 1 1  Language- follow 3 step command 2 3  Language- read & follow direction 1 1  Write a sentence 1 1  Copy design 1 1  Total score 26 28   Montreal Cognitive Assessment  07/21/2017  Visuospatial/ Executive (0/5) 5  Naming (0/3) 2  Attention: Read list of digits (0/2) 2  Attention: Read list of letters (0/1) 1  Attention: Serial 7 subtraction starting at 100 (0/3) 3  Language: Repeat phrase (0/2) 2  Language : Fluency (0/1) 1  Abstraction (0/2) 2  Delayed Recall (0/5) 3  Orientation (0/6) 5  Total 26   6CIT Screen 11/04/2017  What Year? 0 points  What month? 0 points  What time? 0 points  Count back from 20 0 points  Months in reverse 0 points    Immunization History    Administered Date(s) Administered  . Influenza Split 02/11/2011  . Influenza Whole 12/21/2008, 01/16/2010  . Influenza, High Dose Seasonal PF 12/05/2015, 12/18/2016  . Influenza,inj,Quad PF,6+ Mos 12/21/2013, 11/22/2014  . Influenza-Unspecified 12/09/2012  . Pneumococcal Conjugate-13 09/01/2015  . Pneumococcal Polysaccharide-23 03/11/2001, 10/18/2016  . Td 03/19/2010      Screening Tests Health Maintenance  Topic Date Due  . INFLUENZA VACCINE  10/09/2017  . TETANUS/TDAP  03/19/2020  . PNA vac Low Risk Adult  Completed  . DEXA SCAN  Addressed        Plan:    Follow Up as Advised with PCP   I have personally reviewed and noted the following in the patient's chart:   . Medical and social history .  Use of alcohol, tobacco or illicit drugs  . Current medications and supplements . Functional ability and status . Nutritional status . Physical activity . Advanced directives . List of other physicians . Vitals . Screenings to include cognitive, depression, and falls . Referrals and appointments  In addition, I have reviewed and discussed with patient certain preventive protocols, quality metrics, and best practice recommendations. A written personalized care plan for preventive services as well as general preventive health recommendations were provided to patient.     [place], [place]  2/53/6644

## 2017-11-04 NOTE — Progress Notes (Signed)
I have reviewed and agree with note, evaluation, plan. See my separate note about incontinence.   [name], MD

## 2017-11-04 NOTE — Assessment & Plan Note (Signed)
S: incontinence for at least 2 years. Has gotten worse recently she thinks. Still using depedz- changing once a day. Has urgency at times and as to go right away, other times doesn't really even feel it and urinates. No dysuria. No fever/chills. No fecal incontinence. No weakness in legs. No falls since christmas. Doesn't feel imbalanced walking (doubt NPH) . Symptoms worse in the afternoon or evening.  A/P: we had a long discussion today about incontinence and what sounds primarily like OAB related incontinence, she denies issues with coughing/sneezing. With worsening symptoms- get UA (normal) and culture to make sure no infection. On one hand she is interested in meds- on the other she just wants to deal with eh incontinence. I offered referral to urology with Dr. McDiarmid as well which she declines.   From avs  "dont start myrbetriq yet- only start if no infection. I know you wanted to wait some more time anyway.   If there is an infection- we will start by trying to treat that before starting myrbetriq  If you do start the medicine- come see [place] 2 weeks later to check on blood pressure.   Follow up for new or worsening symptoms "

## 2017-11-04 NOTE — Progress Notes (Signed)
PCP notes: Last OV 10/16/2017   Health maintenance:Schedule Flu Shot for this Fall   Abnormal screenings: None   Patient concerns: Bladder Leakage, scheduling patient with Dr. Yong Channel to discuss options. Scheduled for this afternoon at 3   Nurse concerns:None. We did discuss once she is settled staying active, volunteering, taking classes, participating in exercise classes offered.   Next PCP appt: 11/24/2017

## 2017-11-06 LAB — URINE CULTURE
MICRO NUMBER:: 91023355
SPECIMEN QUALITY:: ADEQUATE
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
## 2017-11-07 ENCOUNTER — Encounter (INDEPENDENT_AMBULATORY_CARE_PROVIDER_SITE_OTHER): Payer: Self-pay | Admitting: Ophthalmology

## 2017-11-07 ENCOUNTER — Ambulatory Visit (INDEPENDENT_AMBULATORY_CARE_PROVIDER_SITE_OTHER): Payer: Medicare Other | Admitting: Ophthalmology

## 2017-11-07 DIAGNOSIS — H35033 Hypertensive retinopathy, bilateral: Secondary | ICD-10-CM | POA: Diagnosis not present

## 2017-11-07 DIAGNOSIS — I10 Essential (primary) hypertension: Secondary | ICD-10-CM | POA: Diagnosis not present

## 2017-11-07 DIAGNOSIS — Z961 Presence of intraocular lens: Secondary | ICD-10-CM

## 2017-11-07 DIAGNOSIS — H3562 Retinal hemorrhage, left eye: Secondary | ICD-10-CM | POA: Diagnosis not present

## 2017-11-07 DIAGNOSIS — H35012 Changes in retinal vascular appearance, left eye: Secondary | ICD-10-CM

## 2017-11-07 DIAGNOSIS — H3500 Unspecified background retinopathy: Secondary | ICD-10-CM

## 2017-11-07 DIAGNOSIS — H353221 Exudative age-related macular degeneration, left eye, with active choroidal neovascularization: Secondary | ICD-10-CM | POA: Diagnosis not present

## 2017-11-07 DIAGNOSIS — H3581 Retinal edema: Secondary | ICD-10-CM | POA: Diagnosis not present

## 2017-11-07 MED ORDER — BEVACIZUMAB CHEMO INJECTION 1.25MG/0.05ML SYRINGE FOR KALEIDOSCOPE
1.2500 mg | INTRAVITREAL | Status: DC
  Administered 2017-11-07: 1.25 mg via INTRAVITREAL

## 2017-11-24 ENCOUNTER — Encounter: Payer: Self-pay | Admitting: Family Medicine

## 2017-11-24 ENCOUNTER — Ambulatory Visit (INDEPENDENT_AMBULATORY_CARE_PROVIDER_SITE_OTHER): Payer: Medicare Other | Admitting: Family Medicine

## 2017-11-24 VITALS — BP 102/86 | HR 70 | Temp 97.7°F | Ht 66.0 in | Wt 142.4 lb

## 2017-11-24 DIAGNOSIS — Z23 Encounter for immunization: Secondary | ICD-10-CM

## 2017-11-24 DIAGNOSIS — N3281 Overactive bladder: Secondary | ICD-10-CM | POA: Diagnosis not present
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
## 2017-11-24 NOTE — Addendum Note (Signed)
Addended by: Lucianne Lei M on: 11/24/2017 10:56 AM   Modules accepted: Orders

## 2017-11-24 NOTE — Assessment & Plan Note (Signed)
S:  continued issues with urgency, incontinence. Has not had the confidence yet to trial the myrbetriq.  A/P: She comes in needing some confidence building to take this step. We reviewed that she doesn't have to take the medicine but that the medicine may help her with her symptoms. She plans to start the myrbetriq soon after our discussion. BP looks excellent but advised her to perhaps spots check after stopping medicine to make sure <140/90
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
## 2017-12-04 NOTE — Progress Notes (Signed)
[facility] Note  12/05/2017     CHIEF COMPLAINT Patient presents for Retina Follow Up   HISTORY OF PRESENT ILLNESS: Laura Li is a 77 y.o. female who presents to the clinic today for:   HPI    Retina Follow Up    Patient presents with  Other.  In left eye.  Severity is moderate.  Duration of 4 weeks.  Since onset it is stable.  I, the attending physician,  performed the HPI with the patient and updated documentation appropriately.          Comments    Pt presents for subretinal hemorrhage OS f/u, pt states she thinks vision is a little better, but she still has the same spot in her eye, she states it's hard for her to read, pt denies pain, flashes and floaters, pt is using Refresh and Systaine gtts       Last edited by Bernarda Caffey, MD on 12/05/2017 12:28 PM. (History)    Pt reports taking BP medications; Pt states she is no longer taking xarelto;   Referring physician: Marin Olp, MD [place], [place] [address]  HISTORICAL INFORMATION:   Selected notes from the MEDICAL RECORD NUMBER    CURRENT MEDICATIONS: Current Outpatient Medications (Ophthalmic Drugs)  Medication Sig  . carboxymethylcellulose (REFRESH PLUS) 0.5 % SOLN 2 drops 3 (three) times daily as needed.  [name] Propylene Glycol (SYSTANE BALANCE) 0.6 % SOLN Apply 1-2 drops to eye daily as needed (dry eyes).    No current facility-administered medications for this visit.  (Ophthalmic Drugs)   Current Outpatient Medications (Other)  Medication Sig  . mirabegron ER (MYRBETRIQ) 25 MG TB24 tablet Take 1 tablet (25 mg total) by mouth daily.  [name] acetaminophen (TYLENOL) 500 MG tablet Take 500 mg by mouth 2 (two) times daily as needed for mild pain. Reported on 07/07/2015  . atorvastatin (LIPITOR) 20 MG tablet Take 1 tablet (20 mg total) by mouth daily.  . B Complex-C-E-Zn (B COMPLEX-C-E-ZINC) tablet Take 1 tablet by mouth daily.  . Calcium-Vitamin D (CALTRATE 600  PLUS-VIT D PO) Take 1 tablet by mouth 2 (two) times daily.   [name] esomeprazole (NEXIUM) 20 MG capsule TAKE 1 BY MOUTH DAILY  . fexofenadine (ALLEGRA) 180 MG tablet Take 180 mg by mouth daily as needed for allergies.   [name] ipratropium (ATROVENT) 0.03 % nasal spray Place 2 sprays into the nose every 12 (twelve) hours.  . Lactobacillus-Inulin (CULTURELLE DIGESTIVE HEALTH PO) Take 1 capsule by mouth daily.  [name] levothyroxine (SYNTHROID, LEVOTHROID) 88 MCG tablet TAKE 1 TABLET(88 MCG) BY MOUTH DAILY  . Multiple Vitamins-Minerals (CENTRUM SILVER PO) Take 1 tablet by mouth daily.  . nadolol (CORGARD) 20 MG tablet TAKE 1/2 TABLET(10 MG) BY MOUTH DAILY  . venlafaxine XR (EFFEXOR-XR) 150 MG 24 hr capsule TAKE 1 CAPSULE(150 MG) BY MOUTH DAILY  . vitamin B-12 (CYANOCOBALAMIN) 100 MCG tablet Take 100 mcg by mouth daily.   Current Facility-Administered Medications (Other)  Medication Route  . Bevacizumab (AVASTIN) SOLN 1.25 mg Intravitreal  . Bevacizumab (AVASTIN) SOLN 1.25 mg Intravitreal  . Bevacizumab (AVASTIN) SOLN 1.25 mg Intravitreal      REVIEW OF SYSTEMS: ROS    Positive for: Musculoskeletal, Cardiovascular, Eyes   Negative for: Constitutional, Gastrointestinal, Neurological, Skin, Genitourinary, HENT, Endocrine, Respiratory, Psychiatric, Allergic/Imm, Heme/Lymph   Last edited by Debbrah Alar, COT on 12/05/2017 10:05 AM. (History)       ALLERGIES Allergies  Allergen  Reactions  . Dyazide [Hydrochlorothiazide W-Triamterene]     Presumption: drug induced vasculitis  . Niacin Other (See Comments)    flushing  . Penicillins Diarrhea and Nausea And Vomiting    PAST MEDICAL HISTORY Past Medical History:  Diagnosis Date  . Anemia    from medicaions  . Arthritis   . Chronic kidney disease    some renal impairment from meds  . Depression    states no depression  . Eczema   . Esophageal stricture   . GERD (gastroesophageal reflux disease)   . Headache   . Heart murmur    states it is  benign told 30-40 years ago  . History of DVT (deep vein thrombosis)   . HX: breast cancer    melanoma  . Hyperlipidemia   . Hypertension   . Hypothyroidism   . Melanoma ([place])   . OA (osteoarthritis)   . Personal history of chemotherapy 2016   Left Breast Cancer  . Personal history of radiation therapy 2016   Left Breast Cancer  . Pneumothorax on right 06/15/2014  . PONV (postoperative nausea and vomiting)   . Wears glasses    Past Surgical History:  Procedure Laterality Date  . APPENDECTOMY  1967  . BREAST EXCISIONAL BIOPSY Left   . BREAST LUMPECTOMY Left 2016  . BREAST SURGERY  1977   removal of calcified milk gland right breast  . CHEST TUBE INSERTION  06/15/2014  . CHEST TUBE INSERTION Bilateral 06/15/2014   Procedure: CHEST TUBE INSERTION;  Surgeon: Rolm Bookbinder, MD;  Location: [facility];  Service: General;  Laterality: Bilateral;  . COLONOSCOPY    . [place]   right breast  . MASTECTOMY Right 1998   right-nodes out  . MELANOMA EXCISION  2004   right side of face  . MOHS SURGERY  2005   right left-basal cell  . PORT-A-CATH REMOVAL N/A 10/05/2014   Procedure: REMOVAL PORT-A-CATH;  Surgeon: Rolm Bookbinder, MD;  Location: [facility];  Service: General;  Laterality: N/A;  . PORTACATH PLACEMENT Left 06/15/2014   Procedure: INSERTION PORT-A-CATH;  Surgeon: Rolm Bookbinder, MD;  Location: [facility];  Service: General;  Laterality: Left;  . RADIOACTIVE SEED GUIDED PARTIAL MASTECTOMY WITH AXILLARY SENTINEL LYMPH NODE BIOPSY Left 05/05/2014   Procedure: RADIOACTIVE SEED GUIDED LEFT LUMPECTOMY WITH AXILLARY SENTINEL LYMPH NODE BIOPSY;  Surgeon: Rolm Bookbinder, MD;  Location: [facility];  Service: General;  Laterality: Left;  . TONSILLECTOMY AND ADENOIDECTOMY  1949  . TOTAL ABDOMINAL HYSTERECTOMY  1992   including ovaries    FAMILY HISTORY Family History  Problem Relation Age of Onset  . Heart disease Father 32       smoker  .  Heart attack Father   . Endometrial cancer Mother 85  . Breast cancer Cousin        x 3 cousins with breast cancer (one at 60, one at 41 and one at 79)  . Lung cancer Paternal Aunt        smoker  . Brain cancer Maternal Aunt   . Diabetes Maternal Aunt        ???  . Breast cancer Maternal Aunt 51  . Healthy Sister   . Tuberculosis Maternal Grandfather   . Cervical cancer Paternal Grandmother   . Healthy Daughter   . Colon cancer Neg Hx   . Amblyopia Neg Hx   . Blindness Neg Hx   . Cataracts Neg Hx   . Glaucoma Neg  Hx   . Macular degeneration Neg Hx   . Retinal detachment Neg Hx   . Strabismus Neg Hx   . Retinitis pigmentosa Neg Hx     SOCIAL HISTORY Social History   Tobacco Use  . Smoking status: Former Smoker    Packs/day: 0.75    Years: 3.00    Pack years: 2.25    Types: Cigarettes    Last attempt to quit: 03/11/1969    Years since quitting: 48.7  . Smokeless tobacco: Never Used  Substance Use Topics  . Alcohol use: Yes    Alcohol/week: 0.0 standard drinks    Comment: occasional wine  . Drug use: No         OPHTHALMIC EXAM:  Base Eye Exam    Visual Acuity (Snellen - Linear)      Right Left   Dist cc 20/25 -2 20/150 -2   Dist ph cc 20/20 -2 NI   Correction:  Glasses       Tonometry (Tonopen, 10:12 AM)      Right Left   Pressure 17 18       Pupils      Dark Light Shape React APD   Right 4 2 Round Brisk None   Left 4 2 Round Brisk None       Visual Fields      Left Right     Full   Restrictions Total inferior temporal deficiency        Extraocular Movement      Right Left    Full, Ortho Full, Ortho       Neuro/Psych    Oriented x3:  Yes   Mood/Affect:  Normal       Dilation    Both eyes:  1.0% Mydriacyl, 2.5% Phenylephrine @ 10:12 AM        Slit Lamp and Fundus Exam    Slit Lamp Exam      Right Left   Lids/Lashes Dermatochalasis - upper lid, Telangiectasia Dermatochalasis - upper lid, Telangiectasia   Conjunctiva/Sclera White  and quiet White and quiet   Cornea Arcus, Anterior basement membrane dystrophy, Temporal Well healed cataract wounds, nasal LRI Arcus, Anterior basement membrane dystrophy, Inferior 2+ Punctate epithelial erosions, Temporal Well healed cataract wounds   Anterior Chamber Deep and quiet Deep and quiet   Iris Round and dilated Round and dilated   Lens Multifocal PC IOL in good position with open PC Multifocal PC IOL in good position with open PC   Vitreous Vitreous syneresis, Posterior vitreous detachment Vitreous syneresis       Fundus Exam      Right Left   Disc Pink and Sharp Pink and Sharp, Tilted disc   C/D Ratio 0.45 0.4   Macula Blunted foveal reflex, mild Retinal pigment epithelial mottling, No heme or edema Massive sub-retinal hemorrhage along superior arcades now white and improving, large sub-retinal hemorrhage now turning white, red pre-retinal hemorrhage overlying and surrounding subretinal hemorrhage, red intraretinal hemorrhage along ST arcade, residual red subretinal hemorrhage along nasal and subnasal boarders, two clearing of sub-retinal hemorrhage, superior RAM inveluting, inferior Ram with red blood   Vessels Mild Vascular attenuation, trace AV crossing changes retinal macroaneurysm; Mild Vascular attenuation, trace AV crossing changes   Periphery Attached Attached          IMAGING AND PROCEDURES  Imaging and Procedures for '@TODAY'$ @  OCT, Retina - OU - Both Eyes       Right Eye Quality was good. Central Foveal Thickness:  248. Progression has been stable. Findings include normal foveal contour, no IRF, no SRF (No drusen).   Left Eye Quality was good. Central Foveal Thickness: 315. Progression has improved. Findings include subretinal fluid, abnormal foveal contour, subretinal hyper-reflective material, outer retinal atrophy, no IRF (Interval improvement in [facility] / sub-retinal hemorrhage).   Notes *Images captured and stored on drive  Diagnosis / Impression:  OD:  NFP, No IRF/SRF OS: Massive sub-retinal fluid/hemorrhage improving; ORA  Clinical management:  See below  Abbreviations: NFP - Normal foveal profile. CME - cystoid macular edema. PED - pigment epithelial detachment. IRF - intraretinal fluid. SRF - subretinal fluid. EZ - ellipsoid zone. ERM - epiretinal membrane. ORA - outer retinal atrophy. ORT - outer retinal tubulation. SRHM - subretinal hyper-reflective material         Intravitreal Injection, Pharmacologic Agent - OS - Left Eye       Time Out 12/05/2017. 11:10 AM. Confirmed correct patient, procedure, site, and patient consented.   Anesthesia Topical anesthesia was used. Anesthetic medications included Tetracaine 0.5%, Lidocaine 2%.   Procedure Preparation included 5% betadine to ocular surface, eyelid speculum. A 30 gauge needle was used.   Injection:  1.25 mg Bevacizumab 1.'25mg'$ /0.86m   NDC: 50242-060-01, Lot: [id number]'$ , Expiration date: 01/10/2018   Route: Intravitreal, Site: Left Eye, Waste: 0 mL  Post-op Post injection exam found visual acuity of at least counting fingers. The patient tolerated the procedure well. There were no complications. The patient received written and verbal post procedure care education.                 ASSESSMENT/PLAN:    ICD-10-CM   1. Subretinal hemorrhage of left eye H35.62 OCT, Retina - OU - Both Eyes    Intravitreal Injection, Pharmacologic Agent - OS - Left Eye    Bevacizumab (AVASTIN) SOLN 1.25 mg  2. 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 Retinal edema H35.81 OCT, Retina - OU - Both Eyes  4. Valsalva retinopathy H35.00   5. Essential hypertension I10   6. Hypertensive retinopathy of both eyes H35.033   7. Pseudophakia of both eyes Z96.1   8. Exudative age-related macular degeneration of left eye with active choroidal neovascularization (HCC) H35.3221 Intravitreal Injection, Pharmacologic Agent - OS - Left Eye    Bevacizumab (AVASTIN) SOLN 1.25 mg    1-4.  Retinal macroaneurysm with large sub-macular hemorrhage OS-  - onset of decreased vision associated with violent vomiting/wretching - on xarelto for history of DVT -- pt has stopped for now - exam shows improvement / evolution of large submacular hemorrhage with mild preretinal heme - retinal macroaneurysm visible on exam and FA - differential also includes exudative ARMD -- see #8 below - systemic work-up for hypertension and systemic vascular disease reviewed by Dr. H24/04/2017-- risk factors optimized - S/P IVA OS #1 (08.02.19), #2 (08.30.19) - interval evolution of subretinal heme -- improving - discussed findings and guarded prognosis and treatment options - recommend IVA OS #3 today (09.27.19) - pt wishes to proceed - RBA of procedure discussed, questions answered - informed consent obtained and signed - see procedure note - F/U 4 wks  5,6. Hypertensive retinopathy OU - discussed importance of tight BP control - monitor  7. Pseudophakia OU  - s/p CE/IOL  - beautiful surgery, doing well  - monitor  8. Exudative age related macular degeneration, both eyes.    - s/p IVA OS #1 8.2.19, #2 (08.30.19)  - recommend IVA OS #3 as above today  9.27.19  - pt wishes to be treated with IVA  - f/u in 4 wks   Ophthalmic Meds Ordered this visit:  Meds ordered this encounter  Medications  . Bevacizumab (AVASTIN) SOLN 1.25 mg       Return in about 4 weeks (around 01/02/2018) for F/U ret macroan OS, DFE, OCT.  There are no Patient Instructions on file for this visit.   Explained the diagnoses, plan, and follow up with the patient and they expressed understanding.  Patient expressed understanding of the importance of proper follow up care.   This document serves as a record of services personally performed by Gardiner Sleeper, MD, PhD. It was created on their behalf by Catha Brow, [place], a certified ophthalmic assistant. The creation of this record is the provider's dictation and/or  activities during the visit.  Electronically signed by: Catha Brow, [place]  09.26.19 8:35 AM   Gardiner Sleeper, M.D., Ph.D. Diseases & Surgery of the Retina and Vitreous [place]  I have reviewed the above documentation for accuracy and completeness, and I agree with the above. Gardiner Sleeper, M.D., Ph.D. 12/08/17 8:35 AM   Abbreviations: M myopia (nearsighted); A astigmatism; H hyperopia (farsighted); P presbyopia; Mrx spectacle prescription;  CTL contact lenses; OD right eye; OS left eye; OU both eyes  XT exotropia; ET esotropia; PEK punctate epithelial keratitis; PEE punctate epithelial erosions; DES dry eye syndrome; MGD meibomian gland dysfunction; ATs artificial tears; PFAT's preservative free artificial tears; [place] nuclear sclerotic cataract; PSC posterior subcapsular cataract; ERM epi-retinal membrane; PVD posterior vitreous detachment; RD retinal detachment; DM diabetes mellitus; DR diabetic retinopathy; NPDR non-proliferative diabetic retinopathy; PDR proliferative diabetic retinopathy; CSME clinically significant macular edema; DME diabetic macular edema; dbh dot blot hemorrhages; CWS cotton wool spot; POAG primary open angle glaucoma; C/D cup-to-disc ratio; HVF humphrey visual field; GVF goldmann visual field; OCT optical coherence tomography; IOP intraocular pressure; BRVO Branch retinal vein occlusion; CRVO central retinal vein occlusion; CRAO central retinal artery occlusion; BRAO branch retinal artery occlusion; RT retinal tear; SB scleral buckle; PPV pars plana vitrectomy; VH Vitreous hemorrhage; PRP panretinal laser photocoagulation; IVK intravitreal kenalog; VMT vitreomacular traction; MH Macular hole;  NVD neovascularization of the disc; NVE neovascularization elsewhere; AREDS age related eye disease study; ARMD age related macular degeneration; POAG primary open angle glaucoma; EBMD epithelial/anterior basement membrane dystrophy; ACIOL anterior chamber  intraocular lens; IOL intraocular lens; PCIOL posterior chamber intraocular lens; Phaco/IOL phacoemulsification with intraocular lens placement; [place] photorefractive keratectomy; LASIK laser assisted in situ keratomileusis; HTN hypertension; DM diabetes mellitus; COPD chronic obstructive pulmonary disease

## 2017-12-05 ENCOUNTER — Encounter (INDEPENDENT_AMBULATORY_CARE_PROVIDER_SITE_OTHER): Payer: Self-pay | Admitting: Ophthalmology

## 2017-12-05 ENCOUNTER — Ambulatory Visit (INDEPENDENT_AMBULATORY_CARE_PROVIDER_SITE_OTHER): Payer: Medicare Other | Admitting: Ophthalmology

## 2017-12-05 DIAGNOSIS — H35012 Changes in retinal vascular appearance, left eye: Secondary | ICD-10-CM

## 2017-12-05 DIAGNOSIS — H35033 Hypertensive retinopathy, bilateral: Secondary | ICD-10-CM

## 2017-12-05 DIAGNOSIS — H3581 Retinal edema: Secondary | ICD-10-CM

## 2017-12-05 DIAGNOSIS — H3562 Retinal hemorrhage, left eye: Secondary | ICD-10-CM | POA: Diagnosis not present

## 2017-12-05 DIAGNOSIS — Z961 Presence of intraocular lens: Secondary | ICD-10-CM | POA: Diagnosis not present

## 2017-12-05 DIAGNOSIS — H353221 Exudative age-related macular degeneration, left eye, with active choroidal neovascularization: Secondary | ICD-10-CM

## 2017-12-05 DIAGNOSIS — H3500 Unspecified background retinopathy: Secondary | ICD-10-CM | POA: Diagnosis not present

## 2017-12-05 DIAGNOSIS — I10 Essential (primary) hypertension: Secondary | ICD-10-CM

## 2017-12-05 MED ORDER — BEVACIZUMAB CHEMO INJECTION 1.25MG/0.05ML SYRINGE FOR KALEIDOSCOPE
1.2500 mg | INTRAVITREAL | Status: DC
  Administered 2017-12-05: 1.25 mg via INTRAVITREAL

## 2017-12-08 ENCOUNTER — Encounter (INDEPENDENT_AMBULATORY_CARE_PROVIDER_SITE_OTHER): Payer: Self-pay | Admitting: Ophthalmology

## 2017-12-25 DIAGNOSIS — H1859 Other hereditary corneal dystrophies: Secondary | ICD-10-CM | POA: Diagnosis not present

## 2017-12-25 DIAGNOSIS — H3562 Retinal hemorrhage, left eye: Secondary | ICD-10-CM | POA: Diagnosis not present

## 2017-12-25 DIAGNOSIS — Z961 Presence of intraocular lens: Secondary | ICD-10-CM | POA: Diagnosis not present

## 2017-12-25 DIAGNOSIS — H524 Presbyopia: Secondary | ICD-10-CM | POA: Diagnosis not present

## 2018-01-01 NOTE — Progress Notes (Signed)
[facility] Note  01/02/2018     CHIEF COMPLAINT Patient presents for Retina Evaluation   HISTORY OF PRESENT ILLNESS: Laura Li is a 77 y.o. female who presents to the clinic today for:   HPI    Retina Evaluation    In left eye.  Duration of 4 weeks.  Associated Symptoms Floaters.  Negative for Flashes, Blind Spot, Photophobia, Scalp Tenderness, Fever, Pain, Glare, Jaw Claudication, Weight Loss, Distortion, Redness, Trauma, Shoulder/Hip pain and Fatigue.  Context:  distance vision, mid-range vision and near vision.  Response to treatment was mild improvement.  I, the attending physician,  performed the HPI with the patient and updated documentation appropriately.          Comments    Patient states vision seems about the same since last visit. Patient has noticed some overall improvement in vision OS since starting injections.        Last edited by Bernarda Caffey, MD on 01/02/2018 11:45 AM. (History)    pt states her eyes seem weaker than normal, and she states she has a hard time reading, she states it does help if she covers her left eye  Referring physician: Marin Olp, MD [place], [place] [address]  HISTORICAL INFORMATION:   Selected notes from the [facility]: Current Outpatient Medications (Ophthalmic Drugs)  Medication Sig  . carboxymethylcellulose (REFRESH PLUS) 0.5 % SOLN 2 drops 3 (three) times daily as needed.  [name] Propylene Glycol (SYSTANE BALANCE) 0.6 % SOLN Apply 1-2 drops to eye daily as needed (dry eyes).    No current facility-administered medications for this visit.  (Ophthalmic Drugs)   Current Outpatient Medications (Other)  Medication Sig  . acetaminophen (TYLENOL) 500 MG tablet Take 500 mg by mouth 2 (two) times daily as needed for mild pain. Reported on 07/07/2015  . atorvastatin (LIPITOR) 20 MG tablet Take 1 tablet (20 mg total) by mouth daily.  . B  Complex-C-E-Zn (B COMPLEX-C-E-ZINC) tablet Take 1 tablet by mouth daily.  . Calcium-Vitamin D (CALTRATE 600 PLUS-VIT D PO) Take 1 tablet by mouth 2 (two) times daily.   [name] esomeprazole (NEXIUM) 20 MG capsule TAKE 1 BY MOUTH DAILY  . fexofenadine (ALLEGRA) 180 MG tablet Take 180 mg by mouth daily as needed for allergies.   [name] ipratropium (ATROVENT) 0.03 % nasal spray Place 2 sprays into the nose every 12 (twelve) hours.  . Lactobacillus-Inulin (CULTURELLE DIGESTIVE HEALTH PO) Take 1 capsule by mouth daily.  [name] levothyroxine (SYNTHROID, LEVOTHROID) 88 MCG tablet TAKE 1 TABLET(88 MCG) BY MOUTH DAILY  . mirabegron ER (MYRBETRIQ) 25 MG TB24 tablet Take 1 tablet (25 mg total) by mouth daily.  . Multiple Vitamins-Minerals (CENTRUM SILVER PO) Take 1 tablet by mouth daily.  . nadolol (CORGARD) 20 MG tablet TAKE 1/2 TABLET(10 MG) BY MOUTH DAILY  . venlafaxine XR (EFFEXOR-XR) 150 MG 24 hr capsule TAKE 1 CAPSULE(150 MG) BY MOUTH DAILY  . vitamin B-12 (CYANOCOBALAMIN) 100 MCG tablet Take 100 mcg by mouth daily.   Current Facility-Administered Medications (Other)  Medication Route  . Bevacizumab (AVASTIN) SOLN 1.25 mg Intravitreal  . Bevacizumab (AVASTIN) SOLN 1.25 mg Intravitreal  . Bevacizumab (AVASTIN) SOLN 1.25 mg Intravitreal  . Bevacizumab (AVASTIN) SOLN 1.25 mg Intravitreal      REVIEW OF SYSTEMS: ROS    Positive for: Musculoskeletal, Endocrine, Cardiovascular, Eyes, Heme/Lymph   Negative for: Constitutional, Gastrointestinal, Neurological, Skin, Genitourinary, HENT, Respiratory, Psychiatric, Allergic/Imm  Last edited by Debbrah Alar, COT on 01/02/2018 12:02 PM. (History)       ALLERGIES Allergies  Allergen Reactions  . Dyazide [Hydrochlorothiazide W-Triamterene]     Presumption: drug induced vasculitis  . Niacin Other (See Comments)    flushing  . Penicillins Diarrhea and Nausea And Vomiting    PAST MEDICAL HISTORY Past Medical History:  Diagnosis Date  . Anemia    from  medicaions  . Arthritis   . Chronic kidney disease    some renal impairment from meds  . Depression    states no depression  . Eczema   . Esophageal stricture   . GERD (gastroesophageal reflux disease)   . Headache   . Heart murmur    states it is benign told 30-40 years ago  . History of DVT (deep vein thrombosis)   . HX: breast cancer    melanoma  . Hyperlipidemia   . Hypertension   . Hypothyroidism   . Melanoma ([place])   . OA (osteoarthritis)   . Personal history of chemotherapy 2016   Left Breast Cancer  . Personal history of radiation therapy 2016   Left Breast Cancer  . Pneumothorax on right 06/15/2014  . PONV (postoperative nausea and vomiting)   . Wears glasses    Past Surgical History:  Procedure Laterality Date  . APPENDECTOMY  1967  . BREAST EXCISIONAL BIOPSY Left   . BREAST LUMPECTOMY Left 2016  . BREAST SURGERY  1977   removal of calcified milk gland right breast  . CHEST TUBE INSERTION  06/15/2014  . CHEST TUBE INSERTION Bilateral 06/15/2014   Procedure: CHEST TUBE INSERTION;  Surgeon: Rolm Bookbinder, MD;  Location: [facility];  Service: General;  Laterality: Bilateral;  . COLONOSCOPY    . [place]   right breast  . MASTECTOMY Right 1998   right-nodes out  . MELANOMA EXCISION  2004   right side of face  . MOHS SURGERY  2005   right left-basal cell  . PORT-A-CATH REMOVAL N/A 10/05/2014   Procedure: REMOVAL PORT-A-CATH;  Surgeon: Rolm Bookbinder, MD;  Location: [facility];  Service: General;  Laterality: N/A;  . PORTACATH PLACEMENT Left 06/15/2014   Procedure: INSERTION PORT-A-CATH;  Surgeon: Rolm Bookbinder, MD;  Location: [facility];  Service: General;  Laterality: Left;  . RADIOACTIVE SEED GUIDED PARTIAL MASTECTOMY WITH AXILLARY SENTINEL LYMPH NODE BIOPSY Left 05/05/2014   Procedure: RADIOACTIVE SEED GUIDED LEFT LUMPECTOMY WITH AXILLARY SENTINEL LYMPH NODE BIOPSY;  Surgeon: Rolm Bookbinder, MD;  Location: [facility];   Service: General;  Laterality: Left;  . TONSILLECTOMY AND ADENOIDECTOMY  1949  . TOTAL ABDOMINAL HYSTERECTOMY  1992   including ovaries    FAMILY HISTORY Family History  Problem Relation Age of Onset  . Heart disease Father 8       smoker  . Heart attack Father   . Endometrial cancer Mother 24  . Breast cancer Cousin        x 3 cousins with breast cancer (one at 76, one at 41 and one at 67)  . Lung cancer Paternal Aunt        smoker  . Brain cancer Maternal Aunt   . Diabetes Maternal Aunt        ???  . Breast cancer Maternal Aunt 51  . Healthy Sister   . Tuberculosis Maternal Grandfather   . Cervical cancer Paternal Grandmother   . Healthy Daughter   . Colon cancer Neg Hx   .  Amblyopia Neg Hx   . Blindness Neg Hx   . Cataracts Neg Hx   . Glaucoma Neg Hx   . Macular degeneration Neg Hx   . Retinal detachment Neg Hx   . Strabismus Neg Hx   . Retinitis pigmentosa Neg Hx     SOCIAL HISTORY Social History   Tobacco Use  . Smoking status: Former Smoker    Packs/day: 0.75    Years: 3.00    Pack years: 2.25    Types: Cigarettes    Last attempt to quit: 03/11/1969    Years since quitting: 48.8  . Smokeless tobacco: Never Used  Substance Use Topics  . Alcohol use: Yes    Alcohol/week: 0.0 standard drinks    Comment: occasional wine  . Drug use: No         OPHTHALMIC EXAM:  Base Eye Exam    Visual Acuity (Snellen - Linear)      Right Left   Dist cc 20/20 20/150   Dist ph cc  20/150 +2       Tonometry (Tonopen, 10:25 AM)      Right Left   Pressure 18 15       Pupils      Dark Light Shape React APD   Right 4 2 Round Brisk None   Left 4 2 Round Brisk None       Visual Fields (Counting fingers)      Left Right     Full   Restrictions Total inferior temporal deficiency        Extraocular Movement      Right Left    Full, Ortho Full, Ortho       Neuro/Psych    Oriented x3:  Yes   Mood/Affect:  Normal       Dilation    Both eyes:  1.0%  Mydriacyl, 2.5% Phenylephrine @ 10:25 AM        Slit Lamp and Fundus Exam    Slit Lamp Exam      Right Left   Lids/Lashes Dermatochalasis - upper lid, Telangiectasia Dermatochalasis - upper lid, Telangiectasia   Conjunctiva/Sclera White and quiet White and quiet   Cornea Arcus, Anterior basement membrane dystrophy, Temporal Well healed cataract wounds, nasal LRI Arcus, Anterior basement membrane dystrophy, Inferior 2+ Punctate epithelial erosions, Temporal Well healed cataract wounds   Anterior Chamber Deep and quiet Deep and quiet   Iris Round and dilated Round and dilated   Lens Multifocal PC IOL in good position with open PC Multifocal PC IOL in good position with open PC   Vitreous Vitreous syneresis, Posterior vitreous detachment Vitreous syneresis       Fundus Exam      Right Left   Disc Pink and Sharp Pink and Sharp, Tilted disc, mild temporal pallor   C/D Ratio 0.45 0.4   Macula Blunted foveal reflex, mild Retinal pigment epithelial mottling, No heme or edema Large sub retinal hemorrhage with continued clearance now white-ish yellow with diffuse pigment deposition, red punctate IRH ST to fovea, +edema surrounding SRH   Vessels Mild Vascular attenuation, trace AV crossing changes retinal macroaneurysm; Mild Vascular attenuation, trace AV crossing changes   Periphery Attached Attached        Refraction    Wearing Rx      Sphere Cylinder Axis Add   Right -0.75 +0.75 150 +2.50   Left -1.00 +1.25 127 +2.50       Manifest Refraction (Retinoscopy)      Sphere  Cylinder Axis Dist VA   Right -0.75 +0.75 150    Left -0.50 +0.50 130 20/150+2          IMAGING AND PROCEDURES  Imaging and Procedures for @TODAY @  OCT, Retina - OU - Both Eyes       Right Eye Quality was good. Central Foveal Thickness: 248. Progression has been stable. Findings include normal foveal contour, no IRF, no SRF (No drusen).   Left Eye Quality was good. Central Foveal Thickness: 210. Progression  has improved. Findings include subretinal fluid, abnormal foveal contour, subretinal hyper-reflective material, outer retinal atrophy, no IRF (Interval improvement in [facility] / sub-retinal hemorrhage).   Notes *Images captured and stored on drive  Diagnosis / Impression:  OD: NFP, No IRF/SRF OS: Massive sub-retinal fluid/hemorrhage improving; ORA  Clinical management:  See below  Abbreviations: NFP - Normal foveal profile. CME - cystoid macular edema. PED - pigment epithelial detachment. IRF - intraretinal fluid. SRF - subretinal fluid. EZ - ellipsoid zone. ERM - epiretinal membrane. ORA - outer retinal atrophy. ORT - outer retinal tubulation. SRHM - subretinal hyper-reflective material         Intravitreal Injection, Pharmacologic Agent - OS - Left Eye       Time Out 01/02/2018. 12:30 PM. Confirmed correct patient, procedure, site, and patient consented.   Anesthesia Topical anesthesia was used. Anesthetic medications included Lidocaine 2%, Proparacaine 0.5%.   Procedure Preparation included 5% betadine to ocular surface, eyelid speculum. A 30 gauge needle was used.   Injection:  1.25 mg Bevacizumab 1.25mg /0.61ml   NDC: 50242-060-01, Lot: [id number] , Expiration date: 02/19/2018   Route: Intravitreal, Site: Left Eye, Waste: 0 mL  Post-op Post injection exam found visual acuity of at least counting fingers. The patient tolerated the procedure well. There were no complications. The patient received written and verbal post procedure care education.                 ASSESSMENT/PLAN:    ICD-10-CM   1. Subretinal hemorrhage of left eye H35.62 Intravitreal Injection, Pharmacologic Agent - OS - Left Eye    Bevacizumab (AVASTIN) SOLN 1.25 mg  2. 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 Retinal edema H35.81 OCT, Retina - OU - Both Eyes  4. Valsalva retinopathy H35.00    5. Essential hypertension I10   6. Hypertensive retinopathy of both eyes H35.033   7. Pseudophakia of both eyes Z96.1   8. Exudative age-related macular degeneration of left eye with active choroidal neovascularization (HCC) H35.3221 Intravitreal Injection, Pharmacologic Agent - OS - Left Eye    Bevacizumab (AVASTIN) SOLN 1.25 mg    1-4. Retinal macroaneurysm with large sub-macular hemorrhage OS-  - onset of decreased vision associated with violent vomiting/wretching - on xarelto for history of DVT -- pt has stopped for now - exam shows improvement / evolution of large submacular hemorrhage with mild preretinal heme - retinal macroaneurysm visible on exam and FA - differential also includes exudative ARMD -- see #8 below - systemic work-up for hypertension and systemic vascular disease reviewed by Dr. 25/02/2018 -- risk factors optimized - S/P IVA OS #1 (08.02.19), #2 (08.30.19), #3 (09.27.19) - interval evolution of subretinal heme -- improving - BCVA 20/150 OS -- may be new baseline - discussed findings and guarded prognosis and treatment options - recommend IVA OS #4 today (10.25.19) - pt wishes to proceed - F/U 4 wks  5,6.  Hypertensive retinopathy OU - discussed importance of tight BP control - monitor  7. Pseudophakia OU  - s/p CE/IOL  - beautiful surgery, doing well  - monitor  8. Exudative age related macular degeneration, both eyes.    - s/p IVA OS #1 8.2.19, #2 (08.30.19), #3 (09.27.19)  - recommend IVA OS #4 as above today 10.25.19  - pt wishes to be treated with IVA  - f/u in 4 wks   Ophthalmic Meds Ordered this visit:  Meds ordered this encounter  Medications  . Bevacizumab (AVASTIN) SOLN 1.25 mg       Return in about 4 weeks (around 01/30/2018) for f/u, VH/exu ARMD, DFE, OCT, injxn OU.  There are no Patient Instructions on file for this visit.   Explained the diagnoses, plan, and follow up with the patient and they expressed understanding.  Patient  expressed understanding of the importance of proper follow up care.   This document serves as a record of services personally performed by Gardiner Sleeper, MD, PhD. It was created on their behalf by Ernest Mallick, OA, an ophthalmic assistant. The creation of this record is the provider's dictation and/or activities during the visit.    Electronically signed by: Ernest Mallick, OA  10.24.19 11:34 PM    Gardiner Sleeper, M.D., Ph.D. Diseases & Surgery of the Retina and Vitreous [facility]  I have reviewed the above documentation for accuracy and completeness, and I agree with the above. Gardiner Sleeper, M.D., Ph.D. 01/04/18 11:34 PM    Abbreviations: M myopia (nearsighted); A astigmatism; H hyperopia (farsighted); P presbyopia; Mrx spectacle prescription;  CTL contact lenses; OD right eye; OS left eye; OU both eyes  XT exotropia; ET esotropia; PEK punctate epithelial keratitis; PEE punctate epithelial erosions; DES dry eye syndrome; MGD meibomian gland dysfunction; ATs artificial tears; PFAT's preservative free artificial tears; [place] nuclear sclerotic cataract; PSC posterior subcapsular cataract; ERM epi-retinal membrane; PVD posterior vitreous detachment; RD retinal detachment; DM diabetes mellitus; DR diabetic retinopathy; NPDR non-proliferative diabetic retinopathy; PDR proliferative diabetic retinopathy; CSME clinically significant macular edema; DME diabetic macular edema; dbh dot blot hemorrhages; CWS cotton wool spot; POAG primary open angle glaucoma; C/D cup-to-disc ratio; HVF humphrey visual field; GVF goldmann visual field; OCT optical coherence tomography; IOP intraocular pressure; BRVO Branch retinal vein occlusion; CRVO central retinal vein occlusion; CRAO central retinal artery occlusion; BRAO branch retinal artery occlusion; RT retinal tear; SB scleral buckle; PPV pars plana vitrectomy; VH Vitreous hemorrhage; PRP panretinal laser photocoagulation; IVK intravitreal  kenalog; VMT vitreomacular traction; MH Macular hole;  NVD neovascularization of the disc; NVE neovascularization elsewhere; AREDS age related eye disease study; ARMD age related macular degeneration; POAG primary open angle glaucoma; EBMD epithelial/anterior basement membrane dystrophy; ACIOL anterior chamber intraocular lens; IOL intraocular lens; PCIOL posterior chamber intraocular lens; Phaco/IOL phacoemulsification with intraocular lens placement; [place] photorefractive keratectomy; LASIK laser assisted in situ keratomileusis; HTN hypertension; DM diabetes mellitus; COPD chronic obstructive pulmonary disease

## 2018-01-02 ENCOUNTER — Ambulatory Visit (INDEPENDENT_AMBULATORY_CARE_PROVIDER_SITE_OTHER): Payer: Medicare Other | Admitting: Ophthalmology

## 2018-01-02 ENCOUNTER — Encounter (INDEPENDENT_AMBULATORY_CARE_PROVIDER_SITE_OTHER): Payer: Self-pay | Admitting: Ophthalmology

## 2018-01-02 DIAGNOSIS — H35012 Changes in retinal vascular appearance, left eye: Secondary | ICD-10-CM | POA: Diagnosis not present

## 2018-01-02 DIAGNOSIS — H3500 Unspecified background retinopathy: Secondary | ICD-10-CM

## 2018-01-02 DIAGNOSIS — H3562 Retinal hemorrhage, left eye: Secondary | ICD-10-CM | POA: Diagnosis not present

## 2018-01-02 DIAGNOSIS — H353221 Exudative age-related macular degeneration, left eye, with active choroidal neovascularization: Secondary | ICD-10-CM | POA: Diagnosis not present

## 2018-01-02 DIAGNOSIS — I10 Essential (primary) hypertension: Secondary | ICD-10-CM

## 2018-01-02 DIAGNOSIS — H3581 Retinal edema: Secondary | ICD-10-CM | POA: Diagnosis not present

## 2018-01-02 DIAGNOSIS — H35033 Hypertensive retinopathy, bilateral: Secondary | ICD-10-CM

## 2018-01-02 DIAGNOSIS — Z961 Presence of intraocular lens: Secondary | ICD-10-CM

## 2018-01-04 ENCOUNTER — Encounter (INDEPENDENT_AMBULATORY_CARE_PROVIDER_SITE_OTHER): Payer: Self-pay | Admitting: Ophthalmology

## 2018-01-04 DIAGNOSIS — H3581 Retinal edema: Secondary | ICD-10-CM | POA: Diagnosis not present

## 2018-01-04 DIAGNOSIS — H35012 Changes in retinal vascular appearance, left eye: Secondary | ICD-10-CM | POA: Diagnosis not present

## 2018-01-04 DIAGNOSIS — H353221 Exudative age-related macular degeneration, left eye, with active choroidal neovascularization: Secondary | ICD-10-CM | POA: Diagnosis not present

## 2018-01-04 DIAGNOSIS — H3562 Retinal hemorrhage, left eye: Secondary | ICD-10-CM | POA: Diagnosis not present

## 2018-01-04 MED ORDER — BEVACIZUMAB CHEMO INJECTION 1.25MG/0.05ML SYRINGE FOR KALEIDOSCOPE
1.2500 mg | INTRAVITREAL | Status: DC
  Administered 2018-01-04: 1.25 mg via INTRAVITREAL

## 2018-01-05 ENCOUNTER — Other Ambulatory Visit: Payer: Self-pay | Admitting: Family Medicine

## 2018-01-26 ENCOUNTER — Ambulatory Visit (INDEPENDENT_AMBULATORY_CARE_PROVIDER_SITE_OTHER): Payer: Medicare Other | Admitting: Family Medicine

## 2018-01-26 ENCOUNTER — Encounter: Payer: Self-pay | Admitting: Family Medicine

## 2018-01-26 VITALS — BP 126/88 | HR 64 | Temp 97.4°F | Ht 66.0 in | Wt 142.4 lb

## 2018-01-26 DIAGNOSIS — R195 Other fecal abnormalities: Secondary | ICD-10-CM | POA: Diagnosis not present

## 2018-01-26 DIAGNOSIS — E039 Hypothyroidism, unspecified: Secondary | ICD-10-CM

## 2018-01-26 DIAGNOSIS — I480 Paroxysmal atrial fibrillation: Secondary | ICD-10-CM | POA: Diagnosis not present

## 2018-01-26 NOTE — Progress Notes (Signed)
Subjective:  Laura Li is a 77 y.o. year old very pleasant female patient who presents for/with See problem oriented charting ROS- no nausea, vomiting, diarrhea, abdominal pain. Weight is stable   Past Medical History-  Patient Active Problem List   Diagnosis Date Noted  . MCI (mild cognitive impairment) 10/16/2016    Priority: High  . Paroxysmal atrial fibrillation ([place]) 06/04/2016    Priority: High  . Chronic deep vein thrombosis (DVT) (HCC) 09/01/2015    Priority: High  . Pneumothorax 06/15/2014    Priority: High  . History of left breast cancer 04/24/2014    Priority: High  . History of right breast cancer 04/24/2014    Priority: High  . Vasculitis ([place]) 11/09/2012    Priority: High  . Depression, major, single episode, in partial remission ([place]) 04/24/2017    Priority: Medium  . Overactive bladder 09/19/2016    Priority: Medium  . Acute lower UTI 05/29/2016    Priority: Medium  . Osteopenia 05/23/2016    Priority: Medium  . Melanoma of skin ([place]) 03/31/2014    Priority: Medium  . Chronic kidney disease, stage III (moderate) ([place]) 12/09/2012    Priority: Medium  . 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    Priority: Medium  . Hypothyroidism 02/06/2006    Priority: Medium  . Hyperlipidemia 02/06/2006    Priority: Medium  . Essential hypertension 02/06/2006    Priority: Medium  . Localized swelling, mass, or lump of lower extremity 11/05/2014    Priority: Low  . Diverticulitis 08/09/2014    Priority: Low  . Family history of breast cancer 04/29/2014    Priority: Low  . GERD (gastroesophageal reflux disease) 03/31/2014    Priority: Low  . Hot flashes 03/31/2014    Priority: Low  . Osteoarthritis 03/31/2014    Priority: Low  . Allergic rhinitis 02/06/2006    Priority: Low  . Disorder of bone and cartilage 05/23/2017    Medications- reviewed and updated Current Outpatient Medications  Medication Sig Dispense Refill  . acetaminophen (TYLENOL) 500 MG  tablet Take 500 mg by mouth 2 (two) times daily as needed for mild pain. Reported on 07/07/2015    . atorvastatin (LIPITOR) 20 MG tablet Take 1 tablet (20 mg total) by mouth daily. 90 tablet 3  . B Complex-C-E-Zn (B COMPLEX-C-E-ZINC) tablet Take 1 tablet by mouth daily.    . Calcium-Vitamin D (CALTRATE 600 PLUS-VIT D PO) Take 1 tablet by mouth 2 (two) times daily.     . carboxymethylcellulose (REFRESH PLUS) 0.5 % SOLN 2 drops 3 (three) times daily as needed.    [name] esomeprazole (NEXIUM) 20 MG capsule TAKE 1 BY MOUTH DAILY 90 capsule 3  . fexofenadine (ALLEGRA) 180 MG tablet Take 180 mg by mouth daily as needed for allergies.     [name] ipratropium (ATROVENT) 0.03 % nasal spray Place 2 sprays into the nose every 12 (twelve) hours. 180 mL 3  . Lactobacillus-Inulin (CULTURELLE DIGESTIVE HEALTH PO) Take 1 capsule by mouth daily.    [name] levothyroxine (SYNTHROID, LEVOTHROID) 88 MCG tablet TAKE 1 TABLET(88 MCG) BY MOUTH DAILY 90 tablet 3  . mirabegron ER (MYRBETRIQ) 25 MG TB24 tablet Take 1 tablet (25 mg total) by mouth daily. 30 tablet 5  . Multiple Vitamins-Minerals (CENTRUM SILVER PO) Take 1 tablet by mouth daily.    . nadolol (CORGARD) 20 MG tablet TAKE 1/2 TABLET(10 MG) BY MOUTH DAILY 45 tablet 2  . Propylene Glycol (SYSTANE BALANCE) 0.6 % SOLN Apply 1-2 drops to  eye daily as needed (dry eyes).     . venlafaxine XR (EFFEXOR-XR) 150 MG 24 hr capsule TAKE 1 CAPSULE(150 MG) BY MOUTH DAILY 90 capsule 3  . vitamin B-12 (CYANOCOBALAMIN) 100 MCG tablet Take 100 mcg by mouth daily.     Current Facility-Administered Medications  Medication Dose Route Frequency Provider Last Rate Last Dose  . Bevacizumab (AVASTIN) SOLN 1.25 mg  1.25 mg Intravitreal  Bernarda Caffey, MD   1.25 mg at 10/10/17 1318  . Bevacizumab (AVASTIN) SOLN 1.25 mg  1.25 mg Intravitreal  Bernarda Caffey, MD   1.25 mg at 11/07/17 1206  . Bevacizumab (AVASTIN) SOLN 1.25 mg  1.25 mg Intravitreal  Bernarda Caffey, MD   1.25 mg at 12/05/17 1212  . Bevacizumab  (AVASTIN) SOLN 1.25 mg  1.25 mg Intravitreal  Bernarda Caffey, MD   1.25 mg at 01/04/18 2328    Objective: BP 126/88 (BP Location: Left Arm, Patient Position: Sitting, Cuff Size: Large)   Pulse 64   Temp (!) 97.4 F (36.3 C) (Oral)   Ht '5\' 6"'$  (1.676 m)   Wt 142 lb 6.4 oz (64.6 kg)   SpO2 96%   BMI 22.98 kg/m  Gen: NAD, resting comfortably CV: RRR no murmurs rubs or gallops Lungs: CTAB no crackles, wheeze, rhonchi Abdomen: soft/nontender/nondistended/normal bowel sounds.  Ext: no edema Skin: warm, dry  Assessment/Plan:  Loose stool/diarrhea intermittently Question hypothyroidism/thyroid abnormality-though doubt S: has been at [facility] for several months. She feels like when she eats there she has a problem. Met with Administrator, arts- they wondered if soups with MSG- that she had food allergy. Has had issues with diarrhea for months and had normal TSH in august  Had an episode of diarrhea x1 yesterday- she feels generally within a day of eating there she will get the diarrhea- wonders if she has a food allergy. She states had eye swelling at one point- and saying this she is trying to attribute her subretinal hemorrhage to a food allergy- I told her I thought this correlation was highly improbable. A/P: Patient states she has been having loose stools and diarrhea her cafeteria at [facility]- usually within 24 hours of eating there but she cannot nail down frequency/correlation of this.  We looked back to August and she had a reassuring TSH so doubt this is a thyroid issue.  From AVS:  " Diarrhea could be related to the foods that you are eating.  It would be worth keeping a food journal and the diarrhea journal  to see if there are any obvious triggers.  Please let me know if you have more frequent loose stools.  If they are happening on a daily basis we could do stool studies.  If you have fever, abdominal pain, worsening symptoms I would want to know immediately.  Could consider  repeat thyroid testing but just did this a few months ago and things were normal  Lets follow-up on an as-needed basis- other than scheduling perhaps a 3-6 month regular follow up before you leave.  The diarrhea is not pleasant but will not harm you as long as you remain well-hydrated "   Paroxysmal atrial fibrillation ([place]) S: Patient remains on Xarelto for anticoagulation and nadolol for rate control.Has had some random palpitations as well- doesn't note particular pattern with eating there. Happening about everyday- very mild. Declines having with exertion.  A/P: Stable- could be having some intermittent atrial fibrillation when she is feeling her palpitations.  We will continue to monitor.  Future Appointments  Date Time Provider [place]  01/30/2018  9:45 AM Bernarda Caffey, MD [facility] None  05/25/2018 10:30 AM Philemon Kingdom, MD [facility] None  07/02/2018  1:30 PM [facility] LAB 1 [facility] None  07/02/2018  2:00 PM Magrinat, Virgie Dad, MD [facility] None  07/23/2018  3:30 PM Dohmeier, Asencion Partridge, MD [facility] None  12/15/2018 11:00 AM [facility] HEALTH COACH [facility]   Return precautions advised.  [name], MD

## 2018-01-26 NOTE — Assessment & Plan Note (Signed)
S: Patient remains on Xarelto for anticoagulation and nadolol for rate control.Has had some random palpitations as well- doesn't note particular pattern with eating there. Happening about everyday- very mild. Declines having with exertion.  A/P: Stable- could be having some intermittent atrial fibrillation when she is feeling her palpitations.  We will continue to monitor.

## 2018-01-26 NOTE — Patient Instructions (Addendum)
Diarrhea could be related to the foods that you are eating.  It would be worth keeping a food journal and the diarrhea journal  to see if there are any obvious triggers.  Please let me know if you have more frequent loose stools.  If they are happening on a daily basis we could do stool studies.  If you have fever, abdominal pain, worsening symptoms I would want to know immediately.  Could consider repeat thyroid testing but just did this a few months ago and things were normal  Lets follow-up on an as-needed basis- other than scheduling perhaps a 3-6 month regular follow up before you leave.  The diarrhea is not pleasant but will not harm you as long as you remain well-hydrated

## 2018-01-28 NOTE — Progress Notes (Signed)
[facility] Note  01/30/2018     CHIEF COMPLAINT Patient presents for Retina Follow Up   HISTORY OF PRESENT ILLNESS: Laura Li is a 77 y.o. female who presents to the clinic today for:   HPI    Retina Follow Up    Patient presents with  Other.  In left eye.  This started 3 months ago.  Severity is moderate.  Duration of 3 months.  Since onset it is gradually improving.  I, the attending physician,  performed the HPI with the patient and updated documentation appropriately.          Comments    77 y/o female pt here for 4 wk f/u for sub-mac heme OS.  Feels VA OU is about the same.  Denies pain, flashes, floaters.  Retaine prn OU.       Last edited by Bernarda Caffey, MD on 01/30/2018 12:34 PM. (History)    pt states she feels like her VA is improving, she states that her eyes run a lot and they're sticky  Referring physician: Marin Olp, MD [place], [place] [id number]  HISTORICAL INFORMATION:   Selected notes from the MEDICAL RECORD NUMBER    CURRENT MEDICATIONS: Current Outpatient Medications (Ophthalmic Drugs)  Medication Sig  . carboxymethylcellulose (REFRESH PLUS) 0.5 % SOLN 2 drops 3 (three) times daily as needed.  [name] Propylene Glycol (SYSTANE BALANCE) 0.6 % SOLN Apply 1-2 drops to eye daily as needed (dry eyes).    No current facility-administered medications for this visit.  (Ophthalmic Drugs)   Current Outpatient Medications (Other)  Medication Sig  . acetaminophen (TYLENOL) 500 MG tablet Take 500 mg by mouth 2 (two) times daily as needed for mild pain. Reported on 07/07/2015  . atorvastatin (LIPITOR) 20 MG tablet Take 1 tablet (20 mg total) by mouth daily.  . B Complex-C-E-Zn (B COMPLEX-C-E-ZINC) tablet Take 1 tablet by mouth daily.  . Calcium-Vitamin D (CALTRATE 600 PLUS-VIT D PO) Take 1 tablet by mouth 2 (two) times daily.   [name] esomeprazole (NEXIUM) 20 MG capsule TAKE 1 BY MOUTH DAILY  . fexofenadine  (ALLEGRA) 180 MG tablet Take 180 mg by mouth daily as needed for allergies.   [name] ipratropium (ATROVENT) 0.03 % nasal spray Place 2 sprays into the nose every 12 (twelve) hours.  . Lactobacillus-Inulin (CULTURELLE DIGESTIVE HEALTH PO) Take 1 capsule by mouth daily.  [name] levothyroxine (SYNTHROID, LEVOTHROID) 88 MCG tablet TAKE 1 TABLET(88 MCG) BY MOUTH DAILY  . mirabegron ER (MYRBETRIQ) 25 MG TB24 tablet Take 1 tablet (25 mg total) by mouth daily.  . Multiple Vitamins-Minerals (CENTRUM SILVER PO) Take 1 tablet by mouth daily.  . nadolol (CORGARD) 20 MG tablet TAKE 1/2 TABLET(10 MG) BY MOUTH DAILY  . venlafaxine XR (EFFEXOR-XR) 150 MG 24 hr capsule TAKE 1 CAPSULE(150 MG) BY MOUTH DAILY  . vitamin B-12 (CYANOCOBALAMIN) 100 MCG tablet Take 100 mcg by mouth daily.   Current Facility-Administered Medications (Other)  Medication Route  . Bevacizumab (AVASTIN) SOLN 1.25 mg Intravitreal  . Bevacizumab (AVASTIN) SOLN 1.25 mg Intravitreal  . Bevacizumab (AVASTIN) SOLN 1.25 mg Intravitreal  . Bevacizumab (AVASTIN) SOLN 1.25 mg Intravitreal  . Bevacizumab (AVASTIN) SOLN 1.25 mg Intravitreal      REVIEW OF SYSTEMS: ROS    Positive for: Eyes   Negative for: Constitutional, Gastrointestinal, Neurological, Skin, Genitourinary, Musculoskeletal, HENT, Endocrine, Cardiovascular, Respiratory, Psychiatric, Allergic/Imm, Heme/Lymph   Last edited by Matthew Folks, COA on 01/30/2018 10:24  AM. (History)       ALLERGIES Allergies  Allergen Reactions  . Dyazide [Hydrochlorothiazide W-Triamterene]     Presumption: drug induced vasculitis  . Niacin Other (See Comments)    flushing  . Penicillins Diarrhea and Nausea And Vomiting    PAST MEDICAL HISTORY Past Medical History:  Diagnosis Date  . Anemia    from medicaions  . Arthritis   . Chronic kidney disease    some renal impairment from meds  . Depression    states no depression  . Eczema   . Esophageal stricture   . GERD (gastroesophageal reflux  disease)   . Headache   . Heart murmur    states it is benign told 30-40 years ago  . History of DVT (deep vein thrombosis)   . HX: breast cancer    melanoma  . Hyperlipidemia   . Hypertension   . Hypothyroidism   . Melanoma ([place])   . OA (osteoarthritis)   . Personal history of chemotherapy 2016   Left Breast Cancer  . Personal history of radiation therapy 2016   Left Breast Cancer  . Pneumothorax on right 06/15/2014  . PONV (postoperative nausea and vomiting)   . Wears glasses    Past Surgical History:  Procedure Laterality Date  . APPENDECTOMY  1967  . BREAST EXCISIONAL BIOPSY Left   . BREAST LUMPECTOMY Left 2016  . BREAST SURGERY  1977   removal of calcified milk gland right breast  . CHEST TUBE INSERTION  06/15/2014  . CHEST TUBE INSERTION Bilateral 06/15/2014   Procedure: CHEST TUBE INSERTION;  Surgeon: Rolm Bookbinder, MD;  Location: [facility];  Service: General;  Laterality: Bilateral;  . COLONOSCOPY    . [place]   right breast  . MASTECTOMY Right 1998   right-nodes out  . MELANOMA EXCISION  2004   right side of face  . MOHS SURGERY  2005   right left-basal cell  . PORT-A-CATH REMOVAL N/A 10/05/2014   Procedure: REMOVAL PORT-A-CATH;  Surgeon: Rolm Bookbinder, MD;  Location: [facility];  Service: General;  Laterality: N/A;  . PORTACATH PLACEMENT Left 06/15/2014   Procedure: INSERTION PORT-A-CATH;  Surgeon: Rolm Bookbinder, MD;  Location: [facility];  Service: General;  Laterality: Left;  . RADIOACTIVE SEED GUIDED PARTIAL MASTECTOMY WITH AXILLARY SENTINEL LYMPH NODE BIOPSY Left 05/05/2014   Procedure: RADIOACTIVE SEED GUIDED LEFT LUMPECTOMY WITH AXILLARY SENTINEL LYMPH NODE BIOPSY;  Surgeon: Rolm Bookbinder, MD;  Location: [facility];  Service: General;  Laterality: Left;  . TONSILLECTOMY AND ADENOIDECTOMY  1949  . TOTAL ABDOMINAL HYSTERECTOMY  1992   including ovaries    FAMILY HISTORY Family History  Problem Relation Age  of Onset  . Heart disease Father 90       smoker  . Heart attack Father   . Endometrial cancer Mother 35  . Breast cancer Cousin        x 3 cousins with breast cancer (one at 10, one at 63 and one at 24)  . Lung cancer Paternal Aunt        smoker  . Brain cancer Maternal Aunt   . Diabetes Maternal Aunt        ???  . Breast cancer Maternal Aunt 51  . Healthy Sister   . Tuberculosis Maternal Grandfather   . Cervical cancer Paternal Grandmother   . Healthy Daughter   . Colon cancer Neg Hx   . Amblyopia Neg Hx   . Blindness Neg  Hx   . Cataracts Neg Hx   . Glaucoma Neg Hx   . Macular degeneration Neg Hx   . Retinal detachment Neg Hx   . Strabismus Neg Hx   . Retinitis pigmentosa Neg Hx     SOCIAL HISTORY Social History   Tobacco Use  . Smoking status: Former Smoker    Packs/day: 0.75    Years: 3.00    Pack years: 2.25    Types: Cigarettes    Last attempt to quit: 03/11/1969    Years since quitting: 48.9  . Smokeless tobacco: Never Used  Substance Use Topics  . Alcohol use: Yes    Alcohol/week: 0.0 standard drinks    Comment: occasional wine  . Drug use: No         OPHTHALMIC EXAM:  Base Eye Exam    Visual Acuity (Snellen - Linear)      Right Left   Dist cc 20/25 -2 20/70 -2   Dist ph cc 20/20 20/70   Correction:  Glasses       Tonometry (Tonopen, 10:26 AM)      Right Left   Pressure 16 14       Pupils      Dark Light Shape React APD   Right 4 2 Round Brisk None   Left 4 2 Round Brisk None       Visual Fields (Counting fingers)      Left Right     Full   Restrictions Total inferior temporal deficiency        Extraocular Movement      Right Left    Full, Ortho Full, Ortho       Neuro/Psych    Oriented x3:  Yes   Mood/Affect:  Normal       Dilation    Both eyes:  1.0% Mydriacyl, 2.5% Phenylephrine @ 10:26 AM        Slit Lamp and Fundus Exam    Slit Lamp Exam      Right Left   Lids/Lashes Dermatochalasis - upper lid, Telangiectasia  Dermatochalasis - upper lid, Telangiectasia   Conjunctiva/Sclera White and quiet White and quiet   Cornea Arcus, Anterior basement membrane dystrophy, Temporal Well healed cataract wounds, nasal LRI Arcus, Anterior basement membrane dystrophy, Inferior 2+ Punctate epithelial erosions, Temporal Well healed cataract wounds   Anterior Chamber Deep and quiet Deep and quiet   Iris Round and dilated Round and dilated   Lens Multifocal PC IOL in good position with open PC Multifocal PC IOL in good position with open PC   Vitreous Vitreous syneresis, Posterior vitreous detachment Vitreous syneresis, Posterior vitreous detachment       Fundus Exam      Right Left   Disc Pink and Sharp Pink and Sharp, Tilted disc, mild temporal pallor   C/D Ratio 0.45 0.45   Macula Blunted foveal reflex, mild Retinal pigment epithelial mottling, No heme or edema Large sub retinal hemorrhage with continued clearance now white-ish yellow with diffuse pigment deposition, red punctate IRH ST to fovea, SRH resolved, surrounding edema improved   Vessels Mild Vascular attenuation, trace AV crossing changes retinal macroaneurysm; Mild Vascular attenuation, trace AV crossing changes   Periphery Attached Attached        Refraction    Wearing Rx      Sphere Cylinder Axis Add   Right -0.75 +0.75 150 +2.50   Left -1.00 +1.25 127 +2.50   Age:  13 yrs   Type:  PAL  Manifest Refraction      Sphere Cylinder Axis Dist VA   Right -0.75 +0.75 150 20/20-2   Left -1.00 +1.25 127 20/70          IMAGING AND PROCEDURES  Imaging and Procedures for _0 @  OCT, Retina - OU - Both Eyes       Right Eye Quality was good. Central Foveal Thickness: 242. Progression has been stable. Findings include normal foveal contour, no IRF, no SRF (No drusen).   Left Eye Quality was good. Central Foveal Thickness: 222. Progression has improved. Findings include subretinal fluid, abnormal foveal contour, subretinal hyper-reflective  material, outer retinal atrophy, no IRF (Interval improvement in [facility] / sub-retinal hemorrhage, interval improvement in hyper-reflective material).   Notes *Images captured and stored on drive  Diagnosis / Impression:  OD: NFP, No IRF/SRF OS: Massive sub-retinal fluid/hemorrhage improving; ORA, interval improvement in hyper-reflective mateiral  Clinical management:  See below  Abbreviations: NFP - Normal foveal profile. CME - cystoid macular edema. PED - pigment epithelial detachment. IRF - intraretinal fluid. SRF - subretinal fluid. EZ - ellipsoid zone. ERM - epiretinal membrane. ORA - outer retinal atrophy. ORT - outer retinal tubulation. SRHM - subretinal hyper-reflective material         Intravitreal Injection, Pharmacologic Agent - OS - Left Eye       Time Out 01/30/2018. 11:45 AM. Confirmed correct patient, procedure, site, and patient consented.   Anesthesia Topical anesthesia was used. Anesthetic medications included Lidocaine 2%, Proparacaine 0.5%.   Procedure Preparation included 5% betadine to ocular surface, eyelid speculum. A 30 gauge needle was used.   Injection:  1.25 mg Bevacizumab 1.80m/0.05ml   NDC: 50242-060-01, Lot: [id number] , Expiration date: 04/09/2018   Route: Intravitreal, Site: Left Eye, Waste: 0 mg  Post-op Post injection exam found visual acuity of at least counting fingers. The patient tolerated the procedure well. There were no complications. The patient received written and verbal post procedure care education.        Color Fundus Photography Optos - OU - Both Eyes       Right Eye Progression has been stable. Disc findings include normal observations. Macula : normal observations. Vessels : attenuated. Periphery : normal observations.   Left Eye Progression has improved. Disc findings include normal observations. Macula : hemorrhage, retinal pigment epithelium abnormalities (Yellowing subretinal hemorrhage w/ surrounding pigment changes  -- improving). Vessels : attenuated. Periphery : normal observations.                 ASSESSMENT/PLAN:    ICD-10-CM   1. Subretinal hemorrhage of left eye H35.62 Intravitreal Injection, Pharmacologic Agent - OS - Left Eye    Color Fundus Photography Optos - OU - Both Eyes    Bevacizumab (AVASTIN) SOLN 1.25 mg  2. 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 Retinal edema H35.81 OCT, Retina - OU - Both Eyes  4. Valsalva retinopathy H35.00   5. Essential hypertension I10   6. Hypertensive retinopathy of both eyes H35.033   7. Pseudophakia of both eyes Z96.1   8. Exudative age-related macular degeneration of left eye with active choroidal neovascularization (HCC) H35.3221 Intravitreal Injection, Pharmacologic Agent - OS - Left Eye    Bevacizumab (AVASTIN) SOLN 1.25 mg    1-4. Retinal macroaneurysm with large sub-macular hemorrhage OS-  - onset of decreased vision associated with violent vomiting/wretching - on xarelto for history of DVT -- pt has stopped for now - exam shows improvement / evolution of large submacular hemorrhage  with mild preretinal heme - retinal macroaneurysm visible on exam and prior FA - differential also includes exudative ARMD -- see #8 below - systemic work-up for hypertension and systemic vascular disease reviewed by Dr. Yong Channel -- risk factors optimized - S/P IVA OS #1 (08.02.19), #2 (08.30.19), #3 (09.27.19), #4 (10.25.19) - interval evolution of subretinal heme -- improving and perifoveal heme almost cleared - BCVA 20/70 OS -- significant improvement likely from improvement in perifoveal heme - discussed findings and guarded prognosis and treatment options - recommend IVA OS #5 today (11.22.19) - pt wishes to proceed - F/U 4 wks  5,6. Hypertensive retinopathy OU - discussed importance of tight BP control - monitor  7. Pseudophakia OU  - s/p CE/IOL  - beautiful surgery, doing well  - monitor  8. Exudative age related macular degeneration,  both eyes.    - s/p IVA OS #1 8.2.19, #2 (08.30.19), #3 (09.27.19), #4 (10.25.19)  - recommend IVA OS #5 as above today 11.22.19  - pt wishes to be treated with IVA  - f/u in 4 wks   Ophthalmic Meds Ordered this visit:  Meds ordered this encounter  Medications  . Bevacizumab (AVASTIN) SOLN 1.25 mg       Return in about 4 weeks (around 02/27/2018) for F/U sub mac heme, DFE, OCT, TOPCON.  There are no Patient Instructions on file for this visit.   Explained the diagnoses, plan, and follow up with the patient and they expressed understanding.  Patient expressed understanding of the importance of proper follow up care.   This document serves as a record of services personally performed by Gardiner Sleeper, MD, PhD. It was created on their behalf by Ernest Mallick, OA, an ophthalmic assistant. The creation of this record is the provider's dictation and/or activities during the visit.    Electronically signed by: Ernest Mallick, OA  11.20.19 12:34 PM    Gardiner Sleeper, M.D., Ph.D. Diseases & Surgery of the Retina and Vitreous [facility]   I have reviewed the above documentation for accuracy and completeness, and I agree with the above. Gardiner Sleeper, M.D., Ph.D. 01/30/18 12:34 PM    Abbreviations: M myopia (nearsighted); A astigmatism; H hyperopia (farsighted); P presbyopia; Mrx spectacle prescription;  CTL contact lenses; OD right eye; OS left eye; OU both eyes  XT exotropia; ET esotropia; PEK punctate epithelial keratitis; PEE punctate epithelial erosions; DES dry eye syndrome; MGD meibomian gland dysfunction; ATs artificial tears; PFAT's preservative free artificial tears; [place] nuclear sclerotic cataract; PSC posterior subcapsular cataract; ERM epi-retinal membrane; PVD posterior vitreous detachment; RD retinal detachment; DM diabetes mellitus; DR diabetic retinopathy; NPDR non-proliferative diabetic retinopathy; PDR proliferative diabetic retinopathy; CSME  clinically significant macular edema; DME diabetic macular edema; dbh dot blot hemorrhages; CWS cotton wool spot; POAG primary open angle glaucoma; C/D cup-to-disc ratio; HVF humphrey visual field; GVF goldmann visual field; OCT optical coherence tomography; IOP intraocular pressure; BRVO Branch retinal vein occlusion; CRVO central retinal vein occlusion; CRAO central retinal artery occlusion; BRAO branch retinal artery occlusion; RT retinal tear; SB scleral buckle; PPV pars plana vitrectomy; VH Vitreous hemorrhage; PRP panretinal laser photocoagulation; IVK intravitreal kenalog; VMT vitreomacular traction; MH Macular hole;  NVD neovascularization of the disc; NVE neovascularization elsewhere; AREDS age related eye disease study; ARMD age related macular degeneration; POAG primary open angle glaucoma; EBMD epithelial/anterior basement membrane dystrophy; ACIOL anterior chamber intraocular lens; IOL intraocular lens; PCIOL posterior chamber intraocular lens; Phaco/IOL phacoemulsification with intraocular lens placement; PRK photorefractive  keratectomy; LASIK laser assisted in situ keratomileusis; HTN hypertension; DM diabetes mellitus; COPD chronic obstructive pulmonary disease

## 2018-01-30 ENCOUNTER — Ambulatory Visit (INDEPENDENT_AMBULATORY_CARE_PROVIDER_SITE_OTHER): Payer: Medicare Other | Admitting: Ophthalmology

## 2018-01-30 ENCOUNTER — Encounter (INDEPENDENT_AMBULATORY_CARE_PROVIDER_SITE_OTHER): Payer: Self-pay | Admitting: Ophthalmology

## 2018-01-30 DIAGNOSIS — H3500 Unspecified background retinopathy: Secondary | ICD-10-CM | POA: Diagnosis not present

## 2018-01-30 DIAGNOSIS — H3562 Retinal hemorrhage, left eye: Secondary | ICD-10-CM

## 2018-01-30 DIAGNOSIS — H3581 Retinal edema: Secondary | ICD-10-CM | POA: Diagnosis not present

## 2018-01-30 DIAGNOSIS — Z961 Presence of intraocular lens: Secondary | ICD-10-CM

## 2018-01-30 DIAGNOSIS — H35033 Hypertensive retinopathy, bilateral: Secondary | ICD-10-CM | POA: Diagnosis not present

## 2018-01-30 DIAGNOSIS — H353221 Exudative age-related macular degeneration, left eye, with active choroidal neovascularization: Secondary | ICD-10-CM | POA: Diagnosis not present

## 2018-01-30 DIAGNOSIS — H35012 Changes in retinal vascular appearance, left eye: Secondary | ICD-10-CM

## 2018-01-30 DIAGNOSIS — I10 Essential (primary) hypertension: Secondary | ICD-10-CM | POA: Diagnosis not present

## 2018-01-30 MED ORDER — BEVACIZUMAB CHEMO INJECTION 1.25MG/0.05ML SYRINGE FOR KALEIDOSCOPE
1.2500 mg | INTRAVITREAL | Status: DC
  Administered 2018-01-30: 1.25 mg via INTRAVITREAL

## 2018-01-31 ENCOUNTER — Ambulatory Visit (HOSPITAL_COMMUNITY)
Admission: EM | Admit: 2018-01-31 | Discharge: 2018-01-31 | Disposition: A | Discharge status: Home / Self Care | Payer: Medicare Other | Attending: Family Medicine | Admitting: Family Medicine

## 2018-01-31 ENCOUNTER — Other Ambulatory Visit: Payer: Self-pay

## 2018-01-31 ENCOUNTER — Encounter (HOSPITAL_COMMUNITY): Payer: Self-pay | Admitting: Emergency Medicine

## 2018-01-31 DIAGNOSIS — Z87891 Personal history of nicotine dependence: Secondary | ICD-10-CM | POA: Insufficient documentation

## 2018-01-31 DIAGNOSIS — Z8249 Family history of ischemic heart disease and other diseases of the circulatory system: Secondary | ICD-10-CM | POA: Insufficient documentation

## 2018-01-31 DIAGNOSIS — Z90710 Acquired absence of both cervix and uterus: Secondary | ICD-10-CM | POA: Insufficient documentation

## 2018-01-31 DIAGNOSIS — Z85820 Personal history of malignant melanoma of skin: Secondary | ICD-10-CM | POA: Insufficient documentation

## 2018-01-31 DIAGNOSIS — R350 Frequency of micturition: Secondary | ICD-10-CM

## 2018-01-31 DIAGNOSIS — Z880 Allergy status to penicillin: Secondary | ICD-10-CM | POA: Diagnosis not present

## 2018-01-31 DIAGNOSIS — Z9012 Acquired absence of left breast and nipple: Secondary | ICD-10-CM | POA: Insufficient documentation

## 2018-01-31 DIAGNOSIS — R300 Dysuria: Secondary | ICD-10-CM

## 2018-01-31 DIAGNOSIS — Z9889 Other specified postprocedural states: Secondary | ICD-10-CM | POA: Diagnosis not present

## 2018-01-31 DIAGNOSIS — R3915 Urgency of urination: Secondary | ICD-10-CM

## 2018-01-31 DIAGNOSIS — Z853 Personal history of malignant neoplasm of breast: Secondary | ICD-10-CM | POA: Insufficient documentation

## 2018-01-31 DIAGNOSIS — M5489 Other dorsalgia: Secondary | ICD-10-CM

## 2018-01-31 LAB — POCT URINALYSIS DIP (DEVICE)
Bilirubin Urine: NEGATIVE
Glucose, UA: NEGATIVE mg/dL
KETONES UR: NEGATIVE mg/dL
Nitrite: NEGATIVE
PH: 6 (ref 5.0–8.0)
PROTEIN: NEGATIVE mg/dL
SPECIFIC GRAVITY, URINE: 1.01 (ref 1.005–1.030)
Urobilinogen, UA: 0.2 mg/dL (ref 0.0–1.0)

## 2018-01-31 MED ORDER — CEFUROXIME AXETIL 250 MG PO TABS: 250.0000 mg | ORAL_TABLET | Freq: Two times a day (BID) | ORAL | 0 refills | Status: AC

## 2018-01-31 NOTE — Discharge Instructions (Addendum)
Would wait on medication unless symptoms return. Will call with culture Nice to meet you!

## 2018-01-31 NOTE — ED Provider Notes (Signed)
Patient: Laura Li MRN: [id number] DOB: [birth year] PCP: Marin Olp, MD     Subjective:  Chief Complaint  Patient presents with  . Urinary Tract Infection    HPI: The patient is a 77 y.o. female who presents today for possible UTI. She states her symptoms started this AM when she got up. She states it was very painful to urinate and she had increased frequency. She did just stop her myrbetriq because she didn't feel like it was working. She also has urgency. She does have a baseline of frequency and urgency with her urinary incontinence. No fever/chills. She does have some back pain, but that has resolved. No abdominal pain. Urine has no color change, smell and no visible blood. She is drinking plenty of water. She had no symptoms when she just urinated. She has history of UTI with disorientation.   Review of Systems  Constitutional: Negative for chills and fever.  Respiratory: Negative for cough and shortness of breath.   Cardiovascular: Negative for chest pain.  Gastrointestinal: Negative for abdominal pain, constipation, diarrhea, nausea and vomiting.  Genitourinary: Positive for dysuria, frequency and urgency. Negative for flank pain, hematuria and pelvic pain.  Musculoskeletal: Positive for back pain.  Skin: Negative for rash.    Allergies Patient is allergic to dyazide [hydrochlorothiazide w-triamterene]; niacin; and penicillins.  Past Medical History Patient  has a past medical history of Anemia, Arthritis, Chronic kidney disease, Depression, Eczema, Esophageal stricture, GERD (gastroesophageal reflux disease), Headache, Heart murmur, History of DVT (deep vein thrombosis), breast cancer, Hyperlipidemia, Hypertension, Hypothyroidism, Melanoma ([place]), OA (osteoarthritis), Personal history of chemotherapy (2016), Personal history of radiation therapy (2016), Pneumothorax on right (06/15/2014), PONV (postoperative nausea and vomiting), and Wears glasses.  Surgical  History Patient  has a past surgical history that includes Appendectomy (5852); Melanoma excision (2004); Mohs surgery (2005); Total abdominal hysterectomy (1992); Tonsillectomy and adenoidectomy (1949); Lipoma excision (1978); Breast surgery (1977); Colonoscopy; Radioactive seed guided mastectomy with axillary sentinel lymph node biopsy (Left, 05/05/2014); Chest tube insertion (06/15/2014); Portacath placement (Left, 06/15/2014); Chest tube insertion (Bilateral, 06/15/2014); Port-a-cath removal (N/A, 10/05/2014); Breast lumpectomy (Left, 2016); Mastectomy (Right, 1998); and Breast excisional biopsy (Left).  Family History Pateint's family history includes Brain cancer in her maternal aunt; Breast cancer in her cousin; Breast cancer (age of onset: 47) in her maternal aunt; Cervical cancer in her paternal grandmother; Diabetes in her maternal aunt; Endometrial cancer (age of onset: 64) in her mother; Healthy in her daughter and sister; Heart attack in her father; Heart disease (age of onset: 40) in her father; Lung cancer in her paternal aunt; Tuberculosis in her maternal grandfather.  Social History Patient  reports that she quit smoking about 48 years ago. Her smoking use included cigarettes. She has a 2.25 pack-year smoking history. She has never used smokeless tobacco. She reports that she drinks alcohol. She reports that she does not use drugs.    Objective: Vitals:   01/31/18 1405  BP: 137/70  Pulse: 66  Resp: 18  Temp: (!) 97.2 F (36.2 C)  TempSrc: Oral  SpO2: 96%    There is no height or weight on file to calculate BMI.  Physical Exam  Constitutional: She appears well-developed and well-nourished.  Cardiovascular: Normal rate, regular rhythm and normal heart sounds.  Pulmonary/Chest: Effort normal and breath sounds normal.  Abdominal: Soft. Bowel sounds are normal. There is no tenderness.  Musculoskeletal:  No CVA tenderness bilaterally   Vitals reviewed.     UA: large blood and  small LE. Negative nitrite, negative protein.   Assessment/plan: The encounter diagnosis was Dysuria. -dip not very impressive for infection, but with her history of disorientation and hospitalization will send in ceftin. They will hold this unless her symptoms return and will wait for culture. Fever/confusion/worseing symptoms ER. F/u with PCP to repeat urine since blood on dip.    Orma Flaming, MD  01/31/2018    Orma Flaming, MD 01/31/18 [phone]

## 2018-01-31 NOTE — ED Triage Notes (Signed)
Symptoms started this morning: frequency and pain

## 2018-02-02 LAB — URINE CULTURE

## 2018-02-04 ENCOUNTER — Telehealth: Payer: Self-pay | Admitting: Family Medicine

## 2018-02-04 NOTE — Telephone Encounter (Signed)
Caller states she has pain, frequency, and burning when she urinates. Came on suddenly after she got up this morning. Denies fever. Stopped taking Myrbetriq 6 days ago which was for overactive bladder.  PER [facility]

## 2018-02-04 NOTE — Telephone Encounter (Signed)
Called and left patient a voicemail message asking her to return my call. I stated if she needed there are limited acute visits on Friday

## 2018-02-25 NOTE — Progress Notes (Signed)
[facility] Note  02/27/2018     CHIEF COMPLAINT Patient presents for Retina Follow Up   HISTORY OF PRESENT ILLNESS: Laura Li is a 77 y.o. female who presents to the clinic today for:   HPI    Retina Follow Up    Patient presents with  Other.  In left eye.  Severity is moderate.  Duration of 4 weeks.  Since onset it is stable.  I, the attending physician,  performed the HPI with the patient and updated documentation appropriately.          Comments    Patient states vision the same OU.        Last edited by Bernarda Caffey, MD on 02/27/2018 11:56 AM. (History)    pt feels like her vision is a little better  Referring physician: Shawnie Dapper, DO [address], [place] [address]  HISTORICAL INFORMATION:   Selected notes from the MEDICAL RECORD NUMBER    CURRENT MEDICATIONS: Current Outpatient Medications (Ophthalmic Drugs)  Medication Sig  . carboxymethylcellulose (REFRESH PLUS) 0.5 % SOLN 2 drops 3 (three) times daily as needed.  [name] Propylene Glycol (SYSTANE BALANCE) 0.6 % SOLN Apply 1-2 drops to eye daily as needed (dry eyes).    No current facility-administered medications for this visit.  (Ophthalmic Drugs)   Current Outpatient Medications (Other)  Medication Sig  . acetaminophen (TYLENOL) 500 MG tablet Take 500 mg by mouth 2 (two) times daily as needed for mild pain. Reported on 07/07/2015  . atorvastatin (LIPITOR) 20 MG tablet Take 1 tablet (20 mg total) by mouth daily.  . B Complex-C-E-Zn (B COMPLEX-C-E-ZINC) tablet Take 1 tablet by mouth daily.  . Calcium-Vitamin D (CALTRATE 600 PLUS-VIT D PO) Take 1 tablet by mouth 2 (two) times daily.   [name] esomeprazole (NEXIUM) 20 MG capsule TAKE 1 BY MOUTH DAILY  . fexofenadine (ALLEGRA) 180 MG tablet Take 180 mg by mouth daily as needed for allergies.   [name] ipratropium (ATROVENT) 0.03 % nasal spray Place 2 sprays into the nose every 12 (twelve) hours.  . Lactobacillus-Inulin  (CULTURELLE DIGESTIVE HEALTH PO) Take 1 capsule by mouth daily.  [name] levothyroxine (SYNTHROID, LEVOTHROID) 88 MCG tablet TAKE 1 TABLET(88 MCG) BY MOUTH DAILY  . mirabegron ER (MYRBETRIQ) 25 MG TB24 tablet Take 1 tablet (25 mg total) by mouth daily.  . Multiple Vitamins-Minerals (CENTRUM SILVER PO) Take 1 tablet by mouth daily.  . nadolol (CORGARD) 20 MG tablet TAKE 1/2 TABLET(10 MG) BY MOUTH DAILY  . venlafaxine XR (EFFEXOR-XR) 150 MG 24 hr capsule TAKE 1 CAPSULE(150 MG) BY MOUTH DAILY  . vitamin B-12 (CYANOCOBALAMIN) 100 MCG tablet Take 100 mcg by mouth daily.   Current Facility-Administered Medications (Other)  Medication Route  . Bevacizumab (AVASTIN) SOLN 1.25 mg Intravitreal  . Bevacizumab (AVASTIN) SOLN 1.25 mg Intravitreal  . Bevacizumab (AVASTIN) SOLN 1.25 mg Intravitreal  . Bevacizumab (AVASTIN) SOLN 1.25 mg Intravitreal  . Bevacizumab (AVASTIN) SOLN 1.25 mg Intravitreal      REVIEW OF SYSTEMS: ROS    Positive for: Eyes   Negative for: Constitutional, Gastrointestinal, Neurological, Skin, Genitourinary, Musculoskeletal, HENT, Endocrine, Cardiovascular, Respiratory, Psychiatric, Allergic/Imm, Heme/Lymph   Last edited by Roselee Nova D on 02/27/2018 10:22 AM. (History)       ALLERGIES Allergies  Allergen Reactions  . Dyazide [Hydrochlorothiazide W-Triamterene]     Presumption: drug induced vasculitis  . Niacin Other (See Comments)    flushing  . Penicillins Diarrhea and Nausea  And Vomiting    PAST MEDICAL HISTORY Past Medical History:  Diagnosis Date  . Anemia    from medicaions  . Arthritis   . Chronic kidney disease    some renal impairment from meds  . Depression    states no depression  . Eczema   . Esophageal stricture   . GERD (gastroesophageal reflux disease)   . Headache   . Heart murmur    states it is benign told 30-40 years ago  . History of DVT (deep vein thrombosis)   . HX: breast cancer    melanoma  . Hyperlipidemia   . Hypertension   .  Hypothyroidism   . Melanoma ([place])   . OA (osteoarthritis)   . Personal history of chemotherapy 2016   Left Breast Cancer  . Personal history of radiation therapy 2016   Left Breast Cancer  . Pneumothorax on right 06/15/2014  . PONV (postoperative nausea and vomiting)   . Wears glasses    Past Surgical History:  Procedure Laterality Date  . APPENDECTOMY  1967  . BREAST EXCISIONAL BIOPSY Left   . BREAST LUMPECTOMY Left 2016  . BREAST SURGERY  1977   removal of calcified milk gland right breast  . CHEST TUBE INSERTION  06/15/2014  . CHEST TUBE INSERTION Bilateral 06/15/2014   Procedure: CHEST TUBE INSERTION;  Surgeon: Rolm Bookbinder, MD;  Location: [facility] [place];  Service: General;  Laterality: Bilateral;  . COLONOSCOPY    . [place]   right breast  . MASTECTOMY Right 1998   right-nodes out  . MELANOMA EXCISION  2004   right side of face  . MOHS SURGERY  2005   right left-basal cell  . PORT-A-CATH REMOVAL N/A 10/05/2014   Procedure: REMOVAL PORT-A-CATH;  Surgeon: Rolm Bookbinder, MD;  Location: [facility];  Service: General;  Laterality: N/A;  . PORTACATH PLACEMENT Left 06/15/2014   Procedure: INSERTION PORT-A-CATH;  Surgeon: Rolm Bookbinder, MD;  Location: [facility];  Service: General;  Laterality: Left;  . RADIOACTIVE SEED GUIDED PARTIAL MASTECTOMY WITH AXILLARY SENTINEL LYMPH NODE BIOPSY Left 05/05/2014   Procedure: RADIOACTIVE SEED GUIDED LEFT LUMPECTOMY WITH AXILLARY SENTINEL LYMPH NODE BIOPSY;  Surgeon: Rolm Bookbinder, MD;  Location: [facility];  Service: General;  Laterality: Left;  . TONSILLECTOMY AND ADENOIDECTOMY  1949  . TOTAL ABDOMINAL HYSTERECTOMY  1992   including ovaries    FAMILY HISTORY Family History  Problem Relation Age of Onset  . Heart disease Father 51       smoker  . Heart attack Father   . Endometrial cancer Mother 13  . Breast cancer Cousin        x 3 cousins with breast cancer (one at 55, one at 81 and one at  55)  . Lung cancer Paternal Aunt        smoker  . Brain cancer Maternal Aunt   . Diabetes Maternal Aunt        ???  . Breast cancer Maternal Aunt 51  . Healthy Sister   . Tuberculosis Maternal Grandfather   . Cervical cancer Paternal Grandmother   . Healthy Daughter   . Colon cancer Neg Hx   . Amblyopia Neg Hx   . Blindness Neg Hx   . Cataracts Neg Hx   . Glaucoma Neg Hx   . Macular degeneration Neg Hx   . Retinal detachment Neg Hx   . Strabismus Neg Hx   . Retinitis pigmentosa Neg Hx  SOCIAL HISTORY Social History   Tobacco Use  . Smoking status: Former Smoker    Packs/day: 0.75    Years: 3.00    Pack years: 2.25    Types: Cigarettes    Last attempt to quit: 03/11/1969    Years since quitting: 49.0  . Smokeless tobacco: Never Used  Substance Use Topics  . Alcohol use: Yes    Alcohol/week: 0.0 standard drinks    Comment: occasional wine  . Drug use: No         OPHTHALMIC EXAM:  Base Eye Exam    Visual Acuity (Snellen - Linear)      Right Left   Dist cc 20/20 20/70 -2   Dist ph cc  NI   Correction:  Glasses       Tonometry (Tonopen, 10:29 AM)      Right Left   Pressure 17 14       Pupils      Dark Light Shape React APD   Right 4 2 Round Brisk None   Left 4 2 Round Brisk None       Visual Fields (Counting fingers)      Left Right     Full   Restrictions Total inferior temporal deficiency        Extraocular Movement      Right Left    Full, Ortho Full, Ortho       Neuro/Psych    Oriented x3:  Yes   Mood/Affect:  Normal       Dilation    Both eyes:  1.0% Mydriacyl, 2.5% Phenylephrine @ 10:29 AM        Slit Lamp and Fundus Exam    Slit Lamp Exam      Right Left   Lids/Lashes Dermatochalasis - upper lid, Telangiectasia Dermatochalasis - upper lid, Telangiectasia   Conjunctiva/Sclera White and quiet White and quiet   Cornea Arcus, Anterior basement membrane dystrophy, Temporal Well healed cataract wounds, nasal LRI Arcus, Anterior  basement membrane dystrophy, Inferior 2+ Punctate epithelial erosions, Temporal Well healed cataract wounds   Anterior Chamber Deep and quiet Deep and quiet   Iris Round and dilated Round and dilated   Lens Multifocal PC IOL in good position with open PC Multifocal PC IOL in good position with open PC   Vitreous Vitreous syneresis, Posterior vitreous detachment Vitreous syneresis, Posterior vitreous detachment       Fundus Exam      Right Left   Disc Pink and Sharp Pink and Sharp, Tilted disc, mild temporal pallor   C/D Ratio 0.45 0.5   Macula Blunted foveal reflex, mild Retinal pigment epithelial mottling, No heme or edema Blunted foveal reflex, pigment clumping SRH clearing and now yellow in color, +atrophy   Vessels Mild Vascular attenuation, mild Tortuousity Vascular attenuation   Periphery Attached Attached        Refraction    Wearing Rx      Sphere Cylinder Axis Add   Right -0.75 +0.75 150 +2.50   Left -1.00 +1.25 127 +2.50   Type:  PAL          IMAGING AND PROCEDURES  Imaging and Procedures for @TODAY @  OCT, Retina - OU - Both Eyes       Right Eye Quality was good. Central Foveal Thickness: 241. Progression has been stable. Findings include normal foveal contour, no IRF, no SRF (No drusen).   Left Eye Quality was good. Central Foveal Thickness: 201. Progression has improved. Findings include subretinal  fluid, abnormal foveal contour, subretinal hyper-reflective material, outer retinal atrophy, no IRF (Interval improvement in [facility] / sub-retinal hemorrhage, interval improvement in hyper-reflective material, both converting to atrophy).   Notes *Images captured and stored on drive  Diagnosis / Impression:  OD: NFP, No IRF/SRF OS: Massive sub-retinal fluid/hemorrhage improving; ORA, interval improvement in hyper-reflective mateiral -- converting to atrophy  Clinical management:  See below  Abbreviations: NFP - Normal foveal profile. CME - cystoid macular edema.  PED - pigment epithelial detachment. IRF - intraretinal fluid. SRF - subretinal fluid. EZ - ellipsoid zone. ERM - epiretinal membrane. ORA - outer retinal atrophy. ORT - outer retinal tubulation. SRHM - subretinal hyper-reflective material         Intravitreal Injection, Pharmacologic Agent - OS - Left Eye       Time Out 02/27/2018. 11:54 AM. Confirmed correct patient, procedure, site, and patient consented.   Anesthesia Topical anesthesia was used. Anesthetic medications included Lidocaine 2%, Proparacaine 0.5%.   Procedure Preparation included 5% betadine to ocular surface, eyelid speculum. A 30 gauge needle was used.   Injection:  1.25 mg Bevacizumab (AVASTIN) SOLN   NDC: 29528-413-24, Lot: [id number] , Expiration date: 05/01/2018   Route: Intravitreal, Site: Left Eye, Waste: 0 mL  Post-op Post injection exam found visual acuity of at least counting fingers. The patient tolerated the procedure well. There were no complications. The patient received written and verbal post procedure care education.                 ASSESSMENT/PLAN:    ICD-10-CM   1. Subretinal hemorrhage of left eye H35.62 Intravitreal Injection, Pharmacologic Agent - OS - Left Eye  2. 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 Retinal edema H35.81 OCT, Retina - OU - Both Eyes  4. Valsalva retinopathy H35.00   5. Essential hypertension I10   6. Hypertensive retinopathy of both eyes H35.033   7. Pseudophakia of both eyes Z96.1   8. Exudative age-related macular degeneration of left eye with active choroidal neovascularization ([place]) H35.3221     1-4. Retinal macroaneurysm with large sub-macular hemorrhage OS-  - onset of decreased vision associated with violent vomiting/wretching - on xarelto for history of DVT -- pt has stopped for now - exam shows improvement / evolution of large submacular hemorrhage with mild preretinal heme - retinal macroaneurysm visible on exam and prior FA -  differential also includes exudative ARMD -- see #8 below - systemic work-up for hypertension and systemic vascular disease reviewed by Dr. [address] -- risk factors optimized - S/P IVA OS #1 (08.02.19), #2 (08.30.19), #3 (09.27.19), #4 (10.25.19), #5 (11.22.19) - interval evolution of subretinal heme -- improving and perifoveal heme cleared -- areas of cleared subretinal heme now atrophic - BCVA stable at 20/70 OS -- this may be new baseline - discussed findings and guarded prognosis and treatment options - pt wishes to proceed w IVA OS #6 today (12.20.19) - RBA of procedure discussed, questions answered - informed consent obtained and signed - see procedure note - F/U 4 wks  5,6. Hypertensive retinopathy OU - discussed importance of tight BP control - monitor  7. Pseudophakia OU  - s/p CE/IOL  - beautiful surgery, doing well  - monitor  8. Exudative age related macular degeneration, both eyes.    - s/p IVA OS #1 8.2.19, #2 (08.30.19), #3 (09.27.19), #4 (10.25.19), #5 (11.22.19)  - recommend IVA OS #6 as above today 12.20.19  - pt wishes to be treated with IVA  -  f/u in 4 wks   Ophthalmic Meds Ordered this visit:  No orders of the defined types were placed in this encounter.      Return in about 4 weeks (around 03/27/2018) for F/U VH OS, DFE, OCT.  There are no Patient Instructions on file for this visit.   Explained the diagnoses, plan, and follow up with the patient and they expressed understanding.  Patient expressed understanding of the importance of proper follow up care.   This document serves as a record of services personally performed by Gardiner Sleeper, MD, PhD. It was created on their behalf by Ernest Mallick, OA, an ophthalmic assistant. The creation of this record is the provider's dictation and/or activities during the visit.    Electronically signed by: Ernest Mallick, OA  12.18.19 1:14 PM     Gardiner Sleeper, M.D., Ph.D. Diseases & Surgery of the Retina and  Vitreous [facility]  I have reviewed the above documentation for accuracy and completeness, and I agree with the above. Gardiner Sleeper, M.D., Ph.D. 02/27/18 1:17 PM      Abbreviations: M myopia (nearsighted); A astigmatism; H hyperopia (farsighted); P presbyopia; Mrx spectacle prescription;  CTL contact lenses; OD right eye; OS left eye; OU both eyes  XT exotropia; ET esotropia; PEK punctate epithelial keratitis; PEE punctate epithelial erosions; DES dry eye syndrome; MGD meibomian gland dysfunction; ATs artificial tears; PFAT's preservative free artificial tears; [place] nuclear sclerotic cataract; PSC posterior subcapsular cataract; ERM epi-retinal membrane; PVD posterior vitreous detachment; RD retinal detachment; DM diabetes mellitus; DR diabetic retinopathy; NPDR non-proliferative diabetic retinopathy; PDR proliferative diabetic retinopathy; CSME clinically significant macular edema; DME diabetic macular edema; dbh dot blot hemorrhages; CWS cotton wool spot; POAG primary open angle glaucoma; C/D cup-to-disc ratio; HVF humphrey visual field; GVF goldmann visual field; OCT optical coherence tomography; IOP intraocular pressure; BRVO Branch retinal vein occlusion; CRVO central retinal vein occlusion; CRAO central retinal artery occlusion; BRAO branch retinal artery occlusion; RT retinal tear; SB scleral buckle; PPV pars plana vitrectomy; VH Vitreous hemorrhage; PRP panretinal laser photocoagulation; IVK intravitreal kenalog; VMT vitreomacular traction; MH Macular hole;  NVD neovascularization of the disc; NVE neovascularization elsewhere; AREDS age related eye disease study; ARMD age related macular degeneration; POAG primary open angle glaucoma; EBMD epithelial/anterior basement membrane dystrophy; ACIOL anterior chamber intraocular lens; IOL intraocular lens; PCIOL posterior chamber intraocular lens; Phaco/IOL phacoemulsification with intraocular lens placement; [place] photorefractive  keratectomy; LASIK laser assisted in situ keratomileusis; HTN hypertension; DM diabetes mellitus; COPD chronic obstructive pulmonary disease

## 2018-02-27 ENCOUNTER — Encounter (INDEPENDENT_AMBULATORY_CARE_PROVIDER_SITE_OTHER): Payer: Self-pay | Admitting: Ophthalmology

## 2018-02-27 ENCOUNTER — Ambulatory Visit (INDEPENDENT_AMBULATORY_CARE_PROVIDER_SITE_OTHER): Payer: Medicare Other | Admitting: Ophthalmology

## 2018-02-27 DIAGNOSIS — H35012 Changes in retinal vascular appearance, left eye: Secondary | ICD-10-CM

## 2018-02-27 DIAGNOSIS — I10 Essential (primary) hypertension: Secondary | ICD-10-CM | POA: Diagnosis not present

## 2018-02-27 DIAGNOSIS — H35033 Hypertensive retinopathy, bilateral: Secondary | ICD-10-CM | POA: Diagnosis not present

## 2018-02-27 DIAGNOSIS — H353221 Exudative age-related macular degeneration, left eye, with active choroidal neovascularization: Secondary | ICD-10-CM | POA: Diagnosis not present

## 2018-02-27 DIAGNOSIS — H3562 Retinal hemorrhage, left eye: Secondary | ICD-10-CM | POA: Diagnosis not present

## 2018-02-27 DIAGNOSIS — H3500 Unspecified background retinopathy: Secondary | ICD-10-CM

## 2018-02-27 DIAGNOSIS — Z961 Presence of intraocular lens: Secondary | ICD-10-CM | POA: Diagnosis not present

## 2018-02-27 DIAGNOSIS — H3581 Retinal edema: Secondary | ICD-10-CM | POA: Diagnosis not present

## 2018-02-27 MED ORDER — BEVACIZUMAB CHEMO INJECTION 1.25MG/0.05ML SYRINGE FOR KALEIDOSCOPE
1.2500 mg | INTRAVITREAL | Status: DC
  Administered 2018-02-27: 1.25 mg via INTRAVITREAL

## 2018-03-27 ENCOUNTER — Encounter (INDEPENDENT_AMBULATORY_CARE_PROVIDER_SITE_OTHER): Payer: Self-pay | Admitting: Ophthalmology

## 2018-03-27 ENCOUNTER — Ambulatory Visit (INDEPENDENT_AMBULATORY_CARE_PROVIDER_SITE_OTHER): Payer: Medicare Other | Admitting: Ophthalmology

## 2018-03-27 DIAGNOSIS — I10 Essential (primary) hypertension: Secondary | ICD-10-CM

## 2018-03-27 DIAGNOSIS — H35012 Changes in retinal vascular appearance, left eye: Secondary | ICD-10-CM | POA: Diagnosis not present

## 2018-03-27 DIAGNOSIS — Z961 Presence of intraocular lens: Secondary | ICD-10-CM | POA: Diagnosis not present

## 2018-03-27 DIAGNOSIS — H35033 Hypertensive retinopathy, bilateral: Secondary | ICD-10-CM | POA: Diagnosis not present

## 2018-03-27 DIAGNOSIS — H3500 Unspecified background retinopathy: Secondary | ICD-10-CM

## 2018-03-27 DIAGNOSIS — H353221 Exudative age-related macular degeneration, left eye, with active choroidal neovascularization: Secondary | ICD-10-CM

## 2018-03-27 DIAGNOSIS — H3562 Retinal hemorrhage, left eye: Secondary | ICD-10-CM | POA: Diagnosis not present

## 2018-03-27 DIAGNOSIS — H3581 Retinal edema: Secondary | ICD-10-CM | POA: Diagnosis not present

## 2018-03-27 MED ORDER — BEVACIZUMAB CHEMO INJECTION 1.25MG/0.05ML SYRINGE FOR KALEIDOSCOPE
1.2500 mg | INTRAVITREAL | Status: DC
  Administered 2018-03-27: 1.25 mg via INTRAVITREAL

## 2018-03-27 NOTE — Progress Notes (Signed)
[facility] Note  03/27/2018     CHIEF COMPLAINT Patient presents for Retina Follow Up   HISTORY OF PRESENT ILLNESS: EULINE KIMBLER is a 78 y.o. female who presents to the clinic today for:   HPI    Retina Follow Up    In left eye.  This started 3 months ago.  Since onset it is stable.  I, the attending physician,  performed the HPI with the patient and updated documentation appropriately.          Comments    F/U Subretinal hem OS. Patient states she occasionally see circular shapes with gold outline OS,denies flashes and ocular pain. Pt is ready for tx today if indicted.        Last edited by Bernarda Caffey, MD on 03/27/2018 10:47 AM. (History)    pt states vision is not getting any worse, she states it may be getting a little better, she states it bothers her when she is reading, pt states she has been seeing flashes that last for about 2 seconds, she states they are not localized to one area of the eye, but seem to be all over the place  Referring physician: Marin Olp, MD [place], [place] [address]  HISTORICAL INFORMATION:   Selected notes from the [facility]: Current Outpatient Medications (Ophthalmic Drugs)  Medication Sig  . carboxymethylcellulose (REFRESH PLUS) 0.5 % SOLN 2 drops 3 (three) times daily as needed.  [name] Propylene Glycol (SYSTANE BALANCE) 0.6 % SOLN Apply 1-2 drops to eye daily as needed (dry eyes).    No current facility-administered medications for this visit.  (Ophthalmic Drugs)   Current Outpatient Medications (Other)  Medication Sig  . acetaminophen (TYLENOL) 500 MG tablet Take 500 mg by mouth 2 (two) times daily as needed for mild pain. Reported on 07/07/2015  . atorvastatin (LIPITOR) 20 MG tablet Take 1 tablet (20 mg total) by mouth daily.  . B Complex-C-E-Zn (B COMPLEX-C-E-ZINC) tablet Take 1 tablet by mouth daily.  . Calcium-Vitamin D (CALTRATE 600 PLUS-VIT  D PO) Take 1 tablet by mouth 2 (two) times daily.   [name] esomeprazole (NEXIUM) 20 MG capsule TAKE 1 BY MOUTH DAILY  . fexofenadine (ALLEGRA) 180 MG tablet Take 180 mg by mouth daily as needed for allergies.   [name] ipratropium (ATROVENT) 0.03 % nasal spray Place 2 sprays into the nose every 12 (twelve) hours.  . Lactobacillus-Inulin (CULTURELLE DIGESTIVE HEALTH PO) Take 1 capsule by mouth daily.  [name] levothyroxine (SYNTHROID, LEVOTHROID) 88 MCG tablet TAKE 1 TABLET(88 MCG) BY MOUTH DAILY  . mirabegron ER (MYRBETRIQ) 25 MG TB24 tablet Take 1 tablet (25 mg total) by mouth daily.  . Multiple Vitamins-Minerals (CENTRUM SILVER PO) Take 1 tablet by mouth daily.  . nadolol (CORGARD) 20 MG tablet TAKE 1/2 TABLET(10 MG) BY MOUTH DAILY  . venlafaxine XR (EFFEXOR-XR) 150 MG 24 hr capsule TAKE 1 CAPSULE(150 MG) BY MOUTH DAILY  . vitamin B-12 (CYANOCOBALAMIN) 100 MCG tablet Take 100 mcg by mouth daily.   Current Facility-Administered Medications (Other)  Medication Route  . Bevacizumab (AVASTIN) SOLN 1.25 mg Intravitreal  . Bevacizumab (AVASTIN) SOLN 1.25 mg Intravitreal  . Bevacizumab (AVASTIN) SOLN 1.25 mg Intravitreal  . Bevacizumab (AVASTIN) SOLN 1.25 mg Intravitreal  . Bevacizumab (AVASTIN) SOLN 1.25 mg Intravitreal  . Bevacizumab (AVASTIN) SOLN 1.25 mg Intravitreal  . Bevacizumab (AVASTIN) SOLN 1.25 mg Intravitreal      REVIEW OF  SYSTEMS: ROS    Positive for: Eyes   Negative for: Constitutional, Gastrointestinal, Neurological, Skin, Genitourinary, Musculoskeletal, HENT, Endocrine, Cardiovascular, Respiratory, Psychiatric, Allergic/Imm, Heme/Lymph   Last edited by Zenovia Jordan, LPN on 1/88/4166  0:63 AM. (History)       ALLERGIES Allergies  Allergen Reactions  . Dyazide [Hydrochlorothiazide W-Triamterene]     Presumption: drug induced vasculitis  . Niacin Other (See Comments)    flushing  . Penicillins Diarrhea and Nausea And Vomiting    PAST MEDICAL HISTORY Past Medical History:   Diagnosis Date  . Anemia    from medicaions  . Arthritis   . Chronic kidney disease    some renal impairment from meds  . Depression    states no depression  . Eczema   . Esophageal stricture   . GERD (gastroesophageal reflux disease)   . Headache   . Heart murmur    states it is benign told 30-40 years ago  . History of DVT (deep vein thrombosis)   . HX: breast cancer    melanoma  . Hyperlipidemia   . Hypertension   . Hypothyroidism   . Melanoma ([place])   . OA (osteoarthritis)   . Personal history of chemotherapy 2016   Left Breast Cancer  . Personal history of radiation therapy 2016   Left Breast Cancer  . Pneumothorax on right 06/15/2014  . PONV (postoperative nausea and vomiting)   . Wears glasses    Past Surgical History:  Procedure Laterality Date  . APPENDECTOMY  1967  . BREAST EXCISIONAL BIOPSY Left   . BREAST LUMPECTOMY Left 2016  . BREAST SURGERY  1977   removal of calcified milk gland right breast  . CHEST TUBE INSERTION  06/15/2014  . CHEST TUBE INSERTION Bilateral 06/15/2014   Procedure: CHEST TUBE INSERTION;  Surgeon: Rolm Bookbinder, MD;  Location: [facility];  Service: General;  Laterality: Bilateral;  . COLONOSCOPY    . [place]   right breast  . MASTECTOMY Right 1998   right-nodes out  . MELANOMA EXCISION  2004   right side of face  . MOHS SURGERY  2005   right left-basal cell  . PORT-A-CATH REMOVAL N/A 10/05/2014   Procedure: REMOVAL PORT-A-CATH;  Surgeon: Rolm Bookbinder, MD;  Location: [facility];  Service: General;  Laterality: N/A;  . PORTACATH PLACEMENT Left 06/15/2014   Procedure: INSERTION PORT-A-CATH;  Surgeon: Rolm Bookbinder, MD;  Location: [facility];  Service: General;  Laterality: Left;  . RADIOACTIVE SEED GUIDED PARTIAL MASTECTOMY WITH AXILLARY SENTINEL LYMPH NODE BIOPSY Left 05/05/2014   Procedure: RADIOACTIVE SEED GUIDED LEFT LUMPECTOMY WITH AXILLARY SENTINEL LYMPH NODE BIOPSY;  Surgeon: Rolm Bookbinder, MD;   Location: [facility];  Service: General;  Laterality: Left;  . TONSILLECTOMY AND ADENOIDECTOMY  1949  . TOTAL ABDOMINAL HYSTERECTOMY  1992   including ovaries    FAMILY HISTORY Family History  Problem Relation Age of Onset  . Heart disease Father 83       smoker  . Heart attack Father   . Endometrial cancer Mother 28  . Breast cancer Cousin        x 3 cousins with breast cancer (one at 61, one at 62 and one at 58)  . Lung cancer Paternal Aunt        smoker  . Brain cancer Maternal Aunt   . Diabetes Maternal Aunt        ???  . Breast cancer Maternal Aunt 51  . Healthy  Sister   . Tuberculosis Maternal Grandfather   . Cervical cancer Paternal Grandmother   . Healthy Daughter   . Colon cancer Neg Hx   . Amblyopia Neg Hx   . Blindness Neg Hx   . Cataracts Neg Hx   . Glaucoma Neg Hx   . Macular degeneration Neg Hx   . Retinal detachment Neg Hx   . Strabismus Neg Hx   . Retinitis pigmentosa Neg Hx     SOCIAL HISTORY Social History   Tobacco Use  . Smoking status: Former Smoker    Packs/day: 0.75    Years: 3.00    Pack years: 2.25    Types: Cigarettes    Last attempt to quit: 03/11/1969    Years since quitting: 49.0  . Smokeless tobacco: Never Used  Substance Use Topics  . Alcohol use: Yes    Alcohol/week: 0.0 standard drinks    Comment: occasional wine  . Drug use: No         OPHTHALMIC EXAM:  Base Eye Exam    Visual Acuity (Snellen - Linear)      Right Left   Dist cc 20/20 20/150 +2   Dist ph cc NI NI   Correction:  Glasses       Tonometry (Tonopen, 9:50 AM)      Right Left   Pressure 12 16       Pupils      Dark Light Shape React APD   Right 3 2 Round Brisk None   Left 3 2 Round Brisk None       Visual Fields (Counting fingers)      Left Right    Full Full       Extraocular Movement      Right Left    Full, Ortho Full, Ortho       Neuro/Psych    Oriented x3:  Yes   Mood/Affect:  Normal       Dilation    Both eyes:   1.0% Mydriacyl, 2.5% Phenylephrine @ 9:48 AM        Slit Lamp and Fundus Exam    Slit Lamp Exam      Right Left   Lids/Lashes Dermatochalasis - upper lid, Telangiectasia Dermatochalasis - upper lid, Telangiectasia   Conjunctiva/Sclera White and quiet White and quiet   Cornea Arcus, Anterior basement membrane dystrophy, Temporal Well healed cataract wounds, nasal LRI Arcus, Anterior basement membrane dystrophy, Inferior 2+ Punctate epithelial erosions, Temporal Well healed cataract wounds   Anterior Chamber Deep and quiet Deep and quiet   Iris Round and dilated Round and dilated   Lens Multifocal PC IOL in good position with open PC Multifocal PC IOL in good position with open PC   Vitreous Vitreous syneresis, Posterior vitreous detachment Vitreous syneresis, Posterior vitreous detachment       Fundus Exam      Right Left   Disc Pink and Sharp Pink and Sharp, Tilted disc, mild temporal pallor   C/D Ratio 0.45 0.5   Macula Blunted foveal reflex, mild Retinal pigment epithelial mottling, No heme or edema Blunted foveal reflex, pigment clumping SRH clearing and now yellow in color, +atrophy   Vessels Mild Vascular attenuation, mild Tortuousity Vascular attenuation   Periphery Attached Attached, No RT/RD          IMAGING AND PROCEDURES  Imaging and Procedures for @TODAY @  OCT, Retina - OU - Both Eyes       Right Eye Quality was good. Central Foveal  Thickness: 242. Progression has been stable. Findings include normal foveal contour, no IRF, no SRF (No drusen).   Left Eye Quality was good. Central Foveal Thickness: 193. Progression has improved. Findings include subretinal fluid, abnormal foveal contour, subretinal hyper-reflective material, outer retinal atrophy, no IRF (Interval improvement in [facility] and interval increase in ORA).   Notes *Images captured and stored on drive  Diagnosis / Impression:  OD: NFP, No IRF/SRF OS: sub-retinal fluid/hemorrhage improving; +ORA, interval  improvement in hyper-reflective material   Clinical management:  See below  Abbreviations: NFP - Normal foveal profile. CME - cystoid macular edema. PED - pigment epithelial detachment. IRF - intraretinal fluid. SRF - subretinal fluid. EZ - ellipsoid zone. ERM - epiretinal membrane. ORA - outer retinal atrophy. ORT - outer retinal tubulation. SRHM - subretinal hyper-reflective material         Intravitreal Injection, Pharmacologic Agent - OS - Left Eye       Time Out 03/27/2018. 11:38 AM. Confirmed correct patient, procedure, site, and patient consented.   Anesthesia Topical anesthesia was used. Anesthetic medications included Lidocaine 2%, Proparacaine 0.5%.   Procedure Preparation included 5% betadine to ocular surface, eyelid speculum. A 30 gauge needle was used.   Injection:  1.25 mg Bevacizumab (AVASTIN) SOLN   NDC: 62376-283-15, Lot: [id number] , Expiration date: 04/21/2018   Route: Intravitreal, Site: Left Eye, Waste: 0 mL  Post-op Post injection exam found visual acuity of at least counting fingers. The patient tolerated the procedure well. There were no complications. The patient received written and verbal post procedure care education.                 ASSESSMENT/PLAN:    ICD-10-CM   1. Subretinal hemorrhage of left eye H35.62 Intravitreal Injection, Pharmacologic Agent - OS - Left Eye    Bevacizumab (AVASTIN) SOLN 1.25 mg  2. 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 Retinal edema H35.81 OCT, Retina - OU - Both Eyes  4. Valsalva retinopathy H35.00   5. Essential hypertension I10   6. Hypertensive retinopathy of both eyes H35.033   7. Pseudophakia of both eyes Z96.1   8. Exudative age-related macular degeneration of left eye with active choroidal neovascularization (HCC) H35.3221 Intravitreal Injection, Pharmacologic Agent - OS - Left Eye    Bevacizumab  (AVASTIN) SOLN 1.25 mg    1-4. Retinal macroaneurysm with large sub-macular hemorrhage OS-  - onset of decreased vision associated with violent vomiting/wretching - on xarelto for history of DVT -- pt stopped temporarily - exam shows improvement / evolution of large submacular hemorrhage with mild preretinal heme - retinal macroaneurysm visible on exam and prior FA -- improved today - differential also includes exudative ARMD -- see #8 below - systemic work-up for hypertension and systemic vascular disease reviewed by Dr. 15/01/2019 -- risk factors optimized - S/P IVA OS #1 (08.02.19), #2 (08.30.19), #3 (09.27.19), #4 (10.25.19), #5 (11.22.19), #6 (12.20.19) - interval evolution of subretinal heme -- improving and perifoveal heme cleared -- areas of cleared subretinal heme now atrophic - BCVA decreased to 20/150+2 OS -- this may be new baseline - discussed findings and guarded prognosis and treatment options - pt wishes to proceed w IVA OS #7 today (01.17.20) - RBA of procedure discussed, questions answered - informed consent obtained and signed - see procedure note - F/U 6 wks  5,6. Hypertensive retinopathy OU - discussed importance of tight BP control -  monitor  7. Pseudophakia OU  - s/p CE/IOL  - beautiful surgery, doing well  - monitor  8. Exudative age related macular degeneration, both eyes.    - s/p IVA OS #1 8.2.19, #2 (08.30.19), #3 (09.27.19), #4 (10.25.19), #5 (11.22.19), #6 (12.20.19)  - recommend IVA OS #7 as above today 01.17.20  - pt wishes to be treated with IVA  - f/u in 6 wks   Ophthalmic Meds Ordered this visit:  Meds ordered this encounter  Medications  . Bevacizumab (AVASTIN) SOLN 1.25 mg       Return in about 6 weeks (around 05/08/2018) for f/u VH OS, DFE, OCT.  There are no Patient Instructions on file for this visit.   Explained the diagnoses, plan, and follow up with the patient and they expressed understanding.  Patient expressed understanding of  the importance of proper follow up care.   This document serves as a record of services personally performed by Gardiner Sleeper, MD, PhD. It was created on their behalf by Ernest Mallick, OA, an ophthalmic assistant. The creation of this record is the provider's dictation and/or activities during the visit.    Electronically signed by: Ernest Mallick, OA  01.17.2020 3:17 PM    Gardiner Sleeper, M.D., Ph.D. Diseases & Surgery of the Retina and Vitreous [facility]  I have reviewed the above documentation for accuracy and completeness, and I agree with the above. Gardiner Sleeper, M.D., Ph.D. 03/29/18 3:21 PM   Abbreviations: M myopia (nearsighted); A astigmatism; H hyperopia (farsighted); P presbyopia; Mrx spectacle prescription;  CTL contact lenses; OD right eye; OS left eye; OU both eyes  XT exotropia; ET esotropia; PEK punctate epithelial keratitis; PEE punctate epithelial erosions; DES dry eye syndrome; MGD meibomian gland dysfunction; ATs artificial tears; PFAT's preservative free artificial tears; [place] nuclear sclerotic cataract; PSC posterior subcapsular cataract; ERM epi-retinal membrane; PVD posterior vitreous detachment; RD retinal detachment; DM diabetes mellitus; DR diabetic retinopathy; NPDR non-proliferative diabetic retinopathy; PDR proliferative diabetic retinopathy; CSME clinically significant macular edema; DME diabetic macular edema; dbh dot blot hemorrhages; CWS cotton wool spot; POAG primary open angle glaucoma; C/D cup-to-disc ratio; HVF humphrey visual field; GVF goldmann visual field; OCT optical coherence tomography; IOP intraocular pressure; BRVO Branch retinal vein occlusion; CRVO central retinal vein occlusion; CRAO central retinal artery occlusion; BRAO branch retinal artery occlusion; RT retinal tear; SB scleral buckle; PPV pars plana vitrectomy; VH Vitreous hemorrhage; PRP panretinal laser photocoagulation; IVK intravitreal kenalog; VMT vitreomacular  traction; MH Macular hole;  NVD neovascularization of the disc; NVE neovascularization elsewhere; AREDS age related eye disease study; ARMD age related macular degeneration; POAG primary open angle glaucoma; EBMD epithelial/anterior basement membrane dystrophy; ACIOL anterior chamber intraocular lens; IOL intraocular lens; PCIOL posterior chamber intraocular lens; Phaco/IOL phacoemulsification with intraocular lens placement; [place] photorefractive keratectomy; LASIK laser assisted in situ keratomileusis; HTN hypertension; DM diabetes mellitus; COPD chronic obstructive pulmonary disease

## 2018-03-29 ENCOUNTER — Encounter (INDEPENDENT_AMBULATORY_CARE_PROVIDER_SITE_OTHER): Payer: Self-pay | Admitting: Ophthalmology

## 2018-04-02 ENCOUNTER — Ambulatory Visit: Payer: Self-pay

## 2018-04-02 DIAGNOSIS — J309 Allergic rhinitis, unspecified: Secondary | ICD-10-CM

## 2018-04-02 MED ORDER — IPRATROPIUM BROMIDE 0.03 % NA SOLN: 2.0000 | Freq: Two times a day (BID) | NASAL | 3 refills | Status: DC

## 2018-04-02 NOTE — Telephone Encounter (Signed)
Hold levothyroxine for 2 more days then resume at prior dosage. Glad she is back on the effexor?  Short term extra levothyroxine is not a major concern- it really is more the cumulative effects. Obviously want to avoid this moving forward- does daughter need to perhaps help set up a pill box with morning and night meds?

## 2018-04-02 NOTE — Telephone Encounter (Signed)
Pt daughter Faith Rogue called to say her mother called her today with C/O not feeling well.  Her mothers symptoms were nausea, headache, weird dreams, and feeling like crying. Ms Montine Circle states that her mother has had similar symptoms when not taking her Effexor. She started to question her mother. Her mother realized that she had not taken Effexor for at least 3-4 day and instead had been taking her Levothyroxine morning and then at night thinking that it was her Effexor.  Ms Montine Circle states her mother took her Effexor at that time.  She states that her mother has taken her morning Levothryoxine today. Per protocol I spoke to Roselyn Reef, Dr Ronney Lion nurse.  Roselyn Reef will call Faith Rogue later today with Dr Ronney Lion advice. Ms Montine Circle will inform her mother to not take any more Levothyroxine until advised by Dr Yong Channel. Ms Montine Circle verbalized understanding of all instructions.  Reason for Disposition . [1] DOUBLE DOSE (an extra dose or lesser amount) of prescription drug AND [2] NO symptoms (Exception: a double dose of antibiotics)  Answer Assessment - Initial Assessment Questions 1. SYMPTOMS: "Do you have any symptoms?"     Nausea, HA ,weird dreams,feels like crying 2. SEVERITY: If symptoms are present, ask "Are they mild, moderate or severe?"     Missed Effexor for 3-4 days  Protocols used: MEDICATION QUESTION CALL-A-AH

## 2018-04-02 NOTE — Telephone Encounter (Signed)
See note

## 2018-04-03 NOTE — Telephone Encounter (Signed)
Called and spoke to daughter [name] who verbalized understanding. [name] is going to check into medication management at [facility].

## 2018-04-07 ENCOUNTER — Other Ambulatory Visit: Payer: Self-pay | Admitting: Family Medicine

## 2018-04-07 DIAGNOSIS — Z1231 Encounter for screening mammogram for malignant neoplasm of breast: Secondary | ICD-10-CM

## 2018-04-07 DIAGNOSIS — Z853 Personal history of malignant neoplasm of breast: Secondary | ICD-10-CM

## 2018-04-13 ENCOUNTER — Other Ambulatory Visit: Payer: Self-pay | Admitting: Family Medicine

## 2018-04-20 ENCOUNTER — Ambulatory Visit
Admission: RE | Admit: 2018-04-20 | Discharge: 2018-04-20 | Disposition: A | Discharge status: Home / Self Care | Payer: Medicare Other | Source: Ambulatory Visit | Attending: Family Medicine | Admitting: Family Medicine

## 2018-04-20 DIAGNOSIS — Z853 Personal history of malignant neoplasm of breast: Secondary | ICD-10-CM

## 2018-04-20 DIAGNOSIS — R922 Inconclusive mammogram: Secondary | ICD-10-CM | POA: Diagnosis not present

## 2018-05-07 NOTE — Progress Notes (Signed)
[facility] Note  05/08/2018     CHIEF COMPLAINT Patient presents for Retina Follow Up   HISTORY OF PRESENT ILLNESS: Laura Li is a 78 y.o. female who presents to the clinic today for:   HPI    Retina Follow Up    Patient presents with  Other.  In left eye.  This started 6 weeks ago.  Severity is moderate.  Duration of 6 weeks.  Since onset it is gradually improving.  I, the attending physician,  performed the HPI with the patient and updated documentation appropriately.          Comments    Patient here for 6 weeks follow up for sub-mac heme OS. Patient states vision doing the same. No eye pain just  has driness.       Last edited by Bernarda Caffey, MD on 05/08/2018 10:46 AM. (History)    pt reports stable vision overall, with intermittent fluctuations.  Referring physician: Marin Olp, MD [place], [place] [address]  HISTORICAL INFORMATION:   Selected notes from the MEDICAL RECORD NUMBER    CURRENT MEDICATIONS: Current Outpatient Medications (Ophthalmic Drugs)  Medication Sig  . carboxymethylcellulose (REFRESH PLUS) 0.5 % SOLN 2 drops 3 (three) times daily as needed.  [name] Propylene Glycol (SYSTANE BALANCE) 0.6 % SOLN Apply 1-2 drops to eye daily as needed (dry eyes).    No current facility-administered medications for this visit.  (Ophthalmic Drugs)   Current Outpatient Medications (Other)  Medication Sig  . acetaminophen (TYLENOL) 500 MG tablet Take 500 mg by mouth 2 (two) times daily as needed for mild pain. Reported on 07/07/2015  . atorvastatin (LIPITOR) 20 MG tablet Take 1 tablet (20 mg total) by mouth daily.  . B Complex-C-E-Zn (B COMPLEX-C-E-ZINC) tablet Take 1 tablet by mouth daily.  . Calcium-Vitamin D (CALTRATE 600 PLUS-VIT D PO) Take 1 tablet by mouth 2 (two) times daily.   [name] esomeprazole (NEXIUM) 20 MG capsule TAKE 1 BY MOUTH DAILY  . fexofenadine (ALLEGRA) 180 MG tablet Take 180 mg by mouth daily as  needed for allergies.   [name] ipratropium (ATROVENT) 0.03 % nasal spray Place 2 sprays into the nose every 12 (twelve) hours.  . Lactobacillus-Inulin (CULTURELLE DIGESTIVE HEALTH PO) Take 1 capsule by mouth daily.  [name] levothyroxine (SYNTHROID, LEVOTHROID) 88 MCG tablet TAKE 1 TABLET(88 MCG) BY MOUTH DAILY  . mirabegron ER (MYRBETRIQ) 25 MG TB24 tablet Take 1 tablet (25 mg total) by mouth daily.  . Multiple Vitamins-Minerals (CENTRUM SILVER PO) Take 1 tablet by mouth daily.  . nadolol (CORGARD) 20 MG tablet TAKE 1/2 TABLET(10 MG) BY MOUTH DAILY  . venlafaxine XR (EFFEXOR-XR) 150 MG 24 hr capsule TAKE 1 CAPSULE(150 MG) BY MOUTH DAILY  . vitamin B-12 (CYANOCOBALAMIN) 100 MCG tablet Take 100 mcg by mouth daily.   Current Facility-Administered Medications (Other)  Medication Route  . Bevacizumab (AVASTIN) SOLN 1.25 mg Intravitreal  . Bevacizumab (AVASTIN) SOLN 1.25 mg Intravitreal  . Bevacizumab (AVASTIN) SOLN 1.25 mg Intravitreal  . Bevacizumab (AVASTIN) SOLN 1.25 mg Intravitreal  . Bevacizumab (AVASTIN) SOLN 1.25 mg Intravitreal  . Bevacizumab (AVASTIN) SOLN 1.25 mg Intravitreal  . Bevacizumab (AVASTIN) SOLN 1.25 mg Intravitreal  . Bevacizumab (AVASTIN) SOLN 1.25 mg Intravitreal      REVIEW OF SYSTEMS: ROS    Positive for: Musculoskeletal, Endocrine, Cardiovascular, Eyes, Heme/Lymph   Negative for: Constitutional, Gastrointestinal, Neurological, Skin, Genitourinary, HENT, Respiratory, Psychiatric, Allergic/Imm   Last edited by Loletta Specter,  Eustace Quail on 05/08/2018  9:53 AM. (History)       ALLERGIES Allergies  Allergen Reactions  . Dyazide [Hydrochlorothiazide W-Triamterene]     Presumption: drug induced vasculitis  . Niacin Other (See Comments)    flushing  . Penicillins Diarrhea and Nausea And Vomiting    PAST MEDICAL HISTORY Past Medical History:  Diagnosis Date  . Anemia    from medicaions  . Arthritis   . Chronic kidney disease    some renal impairment from meds  . Depression     states no depression  . Eczema   . Esophageal stricture   . GERD (gastroesophageal reflux disease)   . Headache   . Heart murmur    states it is benign told 30-40 years ago  . History of DVT (deep vein thrombosis)   . HX: breast cancer    melanoma  . Hyperlipidemia   . Hypertension   . Hypothyroidism   . Melanoma ([place])   . OA (osteoarthritis)   . Personal history of chemotherapy 2016   Left Breast Cancer  . Personal history of radiation therapy 2016   Left Breast Cancer  . Pneumothorax on right 06/15/2014  . PONV (postoperative nausea and vomiting)   . Wears glasses    Past Surgical History:  Procedure Laterality Date  . APPENDECTOMY  1967  . BREAST EXCISIONAL BIOPSY Left   . BREAST LUMPECTOMY Left 2016  . BREAST SURGERY  1977   removal of calcified milk gland right breast  . CHEST TUBE INSERTION  06/15/2014  . CHEST TUBE INSERTION Bilateral 06/15/2014   Procedure: CHEST TUBE INSERTION;  Surgeon: Rolm Bookbinder, MD;  Location: [facility];  Service: General;  Laterality: Bilateral;  . COLONOSCOPY    . [place]   right breast  . MASTECTOMY Right 1998   right-nodes out  . MELANOMA EXCISION  2004   right side of face  . MOHS SURGERY  2005   right left-basal cell  . PORT-A-CATH REMOVAL N/A 10/05/2014   Procedure: REMOVAL PORT-A-CATH;  Surgeon: Rolm Bookbinder, MD;  Location: [facility];  Service: General;  Laterality: N/A;  . PORTACATH PLACEMENT Left 06/15/2014   Procedure: INSERTION PORT-A-CATH;  Surgeon: Rolm Bookbinder, MD;  Location: [facility];  Service: General;  Laterality: Left;  . RADIOACTIVE SEED GUIDED PARTIAL MASTECTOMY WITH AXILLARY SENTINEL LYMPH NODE BIOPSY Left 05/05/2014   Procedure: RADIOACTIVE SEED GUIDED LEFT LUMPECTOMY WITH AXILLARY SENTINEL LYMPH NODE BIOPSY;  Surgeon: Rolm Bookbinder, MD;  Location: [facility];  Service: General;  Laterality: Left;  . TONSILLECTOMY AND ADENOIDECTOMY  1949  . TOTAL ABDOMINAL  HYSTERECTOMY  1992   including ovaries    FAMILY HISTORY Family History  Problem Relation Age of Onset  . Heart disease Father 74       smoker  . Heart attack Father   . Endometrial cancer Mother 12  . Breast cancer Cousin        x 3 cousins with breast cancer (one at 40, one at 76 and one at 37)  . Lung cancer Paternal Aunt        smoker  . Brain cancer Maternal Aunt   . Diabetes Maternal Aunt        ???  . Breast cancer Maternal Aunt 51  . Healthy Sister   . Tuberculosis Maternal Grandfather   . Cervical cancer Paternal Grandmother   . Healthy Daughter   . Colon cancer Neg Hx   . Amblyopia Neg  Hx   . Blindness Neg Hx   . Cataracts Neg Hx   . Glaucoma Neg Hx   . Macular degeneration Neg Hx   . Retinal detachment Neg Hx   . Strabismus Neg Hx   . Retinitis pigmentosa Neg Hx     SOCIAL HISTORY Social History   Tobacco Use  . Smoking status: Former Smoker    Packs/day: 0.75    Years: 3.00    Pack years: 2.25    Types: Cigarettes    Last attempt to quit: 03/11/1969    Years since quitting: 49.1  . Smokeless tobacco: Never Used  Substance Use Topics  . Alcohol use: Yes    Alcohol/week: 0.0 standard drinks    Comment: occasional wine  . Drug use: No         OPHTHALMIC EXAM:  Base Eye Exam    Visual Acuity (Snellen - Linear)      Right Left   Dist cc 20/20 -1 20/100 +1   Dist ph cc  NI   Correction:  Glasses       Tonometry (Tonopen, 9:49 AM)      Right Left   Pressure 15 14       Pupils      Dark Light Shape React APD   Right 3 2 Round Brisk None   Left 3 2 Round Brisk None       Visual Fields (Counting fingers)      Left Right    Full Full       Extraocular Movement      Right Left    Full, Ortho Full, Ortho       Neuro/Psych    Oriented x3:  Yes   Mood/Affect:  Normal       Dilation    Both eyes:  1.0% Mydriacyl, 2.5% Phenylephrine @ 9:50 AM        Slit Lamp and Fundus Exam    Slit Lamp Exam      Right Left   Lids/Lashes  Dermatochalasis - upper lid, Telangiectasia Dermatochalasis - upper lid, Telangiectasia   Conjunctiva/Sclera White and quiet White and quiet   Cornea Arcus, Anterior basement membrane dystrophy, Temporal Well healed cataract wounds, nasal LRI Arcus, Anterior basement membrane dystrophy, Inferior 2+ Punctate epithelial erosions, Temporal Well healed cataract wounds   Anterior Chamber Deep and quiet Deep and quiet   Iris Round and dilated Round and dilated   Lens Multifocal PC IOL in good position with open PC Multifocal PC IOL in good position with open PC   Vitreous Vitreous syneresis, Posterior vitreous detachment Vitreous syneresis, Posterior vitreous detachment       Fundus Exam      Right Left   Disc Pink and Sharp Pink and Sharp, Tilted disc, mild temporal pallor   C/D Ratio 0.45 0.5   Macula Blunted foveal reflex, mild Retinal pigment epithelial mottling Blunted foveal reflex, pigment clumping, +RPE atrophy and clumping; overall SRH continues to clear   Vessels Mild Vascular attenuation, mild Tortuousity Vascular attenuation   Periphery Attached Attached, No RT/RD        Refraction    Wearing Rx      Sphere Cylinder Axis Add   Right -0.75 +0.75 150 +2.50   Left -1.00 +1.25 127 +2.50   Type:  PAL          IMAGING AND PROCEDURES  Imaging and Procedures for _0 @  OCT, Retina - OU - Both Eyes  Right Eye Quality was good. Central Foveal Thickness: 241. Progression has been stable. Findings include normal foveal contour, no IRF, no SRF (No drusen).   Left Eye Quality was good. Central Foveal Thickness: 187. Progression has improved. Findings include subretinal fluid, abnormal foveal contour, subretinal hyper-reflective material, outer retinal atrophy, no IRF (Interval improvement in [employer] ).   Notes *Images captured and stored on drive  Diagnosis / Impression:  OD: NFP, No IRF/SRF-stable OS: sub-retinal fluid/hemorrhage improving; +ORA, interval improvement in  hyper-reflective material   Clinical management:  See below  Abbreviations: NFP - Normal foveal profile. CME - cystoid macular edema. PED - pigment epithelial detachment. IRF - intraretinal fluid. SRF - subretinal fluid. EZ - ellipsoid zone. ERM - epiretinal membrane. ORA - outer retinal atrophy. ORT - outer retinal tubulation. SRHM - subretinal hyper-reflective material         Intravitreal Injection, Pharmacologic Agent - OS - Left Eye       Time Out 05/08/2018. 11:04 AM. Confirmed correct patient, procedure, site, and patient consented.   Anesthesia Topical anesthesia was used. Anesthetic medications included Lidocaine 2%, Proparacaine 0.5%.   Procedure Preparation included 5% betadine to ocular surface, eyelid speculum. A supplied needle was used.   Injection:  1.25 mg Bevacizumab (AVASTIN) SOLN   NDC: 17915-056-97, Lot: [id number] , Expiration date: 07/01/2018   Route: Intravitreal, Site: Left Eye, Waste: 0 mL  Post-op Post injection exam found visual acuity of at least counting fingers. The patient tolerated the procedure well. There were no complications. The patient received written and verbal post procedure care education.                 ASSESSMENT/PLAN:    ICD-10-CM   1. Subretinal hemorrhage of left eye H35.62 Intravitreal Injection, Pharmacologic Agent - OS - Left Eye    Bevacizumab (AVASTIN) SOLN 1.25 mg  2. 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 Retinal edema H35.81 OCT, Retina - OU - Both Eyes  4. Valsalva retinopathy H35.00   5. Essential hypertension I10   6. Hypertensive retinopathy of both eyes H35.033   7. Pseudophakia of both eyes Z96.1   8. Exudative age-related macular degeneration of left eye with active choroidal neovascularization (HCC) H35.3221 Intravitreal Injection, Pharmacologic Agent - OS - Left Eye    Bevacizumab (AVASTIN) SOLN 1.25 mg    1-4. Retinal macroaneurysm with large sub-macular hemorrhage OS-  - onset of decreased  vision associated with violent vomiting/wretching - on xarelto for history of DVT -- pt stopped temporarily - exam shows improvement / evolution of large submacular hemorrhage with mild preretinal heme - retinal macroaneurysm visible on exam and prior FA -- improved today - differential also includes exudative ARMD -- see #8 below - systemic work-up for hypertension and systemic vascular disease reviewed by Dr. Yong Channel -- risk factors optimized - S/P IVA OS #1 (08.02.19), #2 (08.30.19), #3 (09.27.19), #4 (10.25.19), #5 (11.22.19), #6 (12.20.19), #7 (01.17.20) - interval evolution of subretinal heme -- improving and perifoveal heme cleared -- areas of cleared subretinal heme now atrophic - BCVA 20/100+1 OS -- guarded prognosis with IVA treatment - discussed findings and guarded prognosis and treatment options - pt wishes to proceed w IVA OS #8 today (02.28.20) - RBA of procedure discussed, questions answered - informed consent obtained and signed - see procedure note - F/U 8 wks  5,6. Hypertensive retinopathy OU - discussed importance of tight BP control - monitor  7. Pseudophakia OU  - s/p CE/IOL  - beautiful surgery,  doing well  - monitor  8. Exudative age related macular degeneration, both eyes.    - s/p IVA OS #1 8.2.19, #2 (08.30.19), #3 (09.27.19), #4 (10.25.19), #5 (11.22.19), #6 (12.20.19), #7 (01.17.20)  - recommend IVA OS #8 as above today 02.28.20  - pt wishes to be treated with IVA  - f/u in 6 wks   Ophthalmic Meds Ordered this visit:  Meds ordered this encounter  Medications  . Bevacizumab (AVASTIN) SOLN 1.25 mg       Return 8 weeks, for DFE, OCT.  There are no Patient Instructions on file for this visit.   Explained the diagnoses, plan, and follow up with the patient and they expressed understanding.  Patient expressed understanding of the importance of proper follow up care.   This document serves as a record of services personally performed by Gardiner Sleeper, MD, PhD. It was created on their behalf by Ernest Mallick, OA, an ophthalmic assistant. The creation of this record is the provider's dictation and/or activities during the visit.    Electronically signed by: Ernest Mallick, OA  02.27.2020 12:33 PM    Gardiner Sleeper, M.D., Ph.D. Diseases & Surgery of the Retina and Vitreous [facility]  I have reviewed the above documentation for accuracy and completeness, and I agree with the above. Gardiner Sleeper, M.D., Ph.D. 05/08/18 12:35 PM     Abbreviations: M myopia (nearsighted); A astigmatism; H hyperopia (farsighted); P presbyopia; Mrx spectacle prescription;  CTL contact lenses; OD right eye; OS left eye; OU both eyes  XT exotropia; ET esotropia; PEK punctate epithelial keratitis; PEE punctate epithelial erosions; DES dry eye syndrome; MGD meibomian gland dysfunction; ATs artificial tears; PFAT's preservative free artificial tears; [place] nuclear sclerotic cataract; PSC posterior subcapsular cataract; ERM epi-retinal membrane; PVD posterior vitreous detachment; RD retinal detachment; DM diabetes mellitus; DR diabetic retinopathy; NPDR non-proliferative diabetic retinopathy; PDR proliferative diabetic retinopathy; CSME clinically significant macular edema; DME diabetic macular edema; dbh dot blot hemorrhages; CWS cotton wool spot; POAG primary open angle glaucoma; C/D cup-to-disc ratio; HVF humphrey visual field; GVF goldmann visual field; OCT optical coherence tomography; IOP intraocular pressure; BRVO Branch retinal vein occlusion; CRVO central retinal vein occlusion; CRAO central retinal artery occlusion; BRAO branch retinal artery occlusion; RT retinal tear; SB scleral buckle; PPV pars plana vitrectomy; VH Vitreous hemorrhage; PRP panretinal laser photocoagulation; IVK intravitreal kenalog; VMT vitreomacular traction; MH Macular hole;  NVD neovascularization of the disc; NVE neovascularization elsewhere; AREDS age related eye  disease study; ARMD age related macular degeneration; POAG primary open angle glaucoma; EBMD epithelial/anterior basement membrane dystrophy; ACIOL anterior chamber intraocular lens; IOL intraocular lens; PCIOL posterior chamber intraocular lens; Phaco/IOL phacoemulsification with intraocular lens placement; [place] photorefractive keratectomy; LASIK laser assisted in situ keratomileusis; HTN hypertension; DM diabetes mellitus; COPD chronic obstructive pulmonary disease

## 2018-05-08 ENCOUNTER — Encounter (INDEPENDENT_AMBULATORY_CARE_PROVIDER_SITE_OTHER): Payer: Self-pay | Admitting: Ophthalmology

## 2018-05-08 ENCOUNTER — Ambulatory Visit (INDEPENDENT_AMBULATORY_CARE_PROVIDER_SITE_OTHER): Payer: Medicare Other | Admitting: Ophthalmology

## 2018-05-08 DIAGNOSIS — Z961 Presence of intraocular lens: Secondary | ICD-10-CM

## 2018-05-08 DIAGNOSIS — H3562 Retinal hemorrhage, left eye: Secondary | ICD-10-CM

## 2018-05-08 DIAGNOSIS — H3581 Retinal edema: Secondary | ICD-10-CM | POA: Diagnosis not present

## 2018-05-08 DIAGNOSIS — H3500 Unspecified background retinopathy: Secondary | ICD-10-CM

## 2018-05-08 DIAGNOSIS — H35012 Changes in retinal vascular appearance, left eye: Secondary | ICD-10-CM | POA: Diagnosis not present

## 2018-05-08 DIAGNOSIS — H35033 Hypertensive retinopathy, bilateral: Secondary | ICD-10-CM | POA: Diagnosis not present

## 2018-05-08 DIAGNOSIS — H353221 Exudative age-related macular degeneration, left eye, with active choroidal neovascularization: Secondary | ICD-10-CM

## 2018-05-08 DIAGNOSIS — I10 Essential (primary) hypertension: Secondary | ICD-10-CM | POA: Diagnosis not present

## 2018-05-08 MED ORDER — BEVACIZUMAB CHEMO INJECTION 1.25MG/0.05ML SYRINGE FOR KALEIDOSCOPE
1.2500 mg | INTRAVITREAL | Status: DC
  Administered 2018-05-08: 1.25 mg via INTRAVITREAL

## 2018-05-19 DIAGNOSIS — D485 Neoplasm of uncertain behavior of skin: Secondary | ICD-10-CM | POA: Diagnosis not present

## 2018-05-19 DIAGNOSIS — Z85820 Personal history of malignant melanoma of skin: Secondary | ICD-10-CM | POA: Diagnosis not present

## 2018-05-19 DIAGNOSIS — L820 Inflamed seborrheic keratosis: Secondary | ICD-10-CM | POA: Diagnosis not present

## 2018-05-19 DIAGNOSIS — L821 Other seborrheic keratosis: Secondary | ICD-10-CM | POA: Diagnosis not present

## 2018-05-21 ENCOUNTER — Other Ambulatory Visit: Payer: Self-pay | Admitting: Family Medicine

## 2018-05-25 ENCOUNTER — Telehealth: Payer: Self-pay | Admitting: Internal Medicine

## 2018-05-25 ENCOUNTER — Ambulatory Visit: Payer: Medicare Other | Admitting: Internal Medicine

## 2018-05-25 NOTE — Telephone Encounter (Signed)
Patient no showed today's appt. Please advise on how to follow up. °A. No follow up necessary. °B. Follow up urgent. Contact patient immediately. °C. Follow up necessary. Contact patient and schedule visit in ___ days. °D. Follow up advised. Contact patient and schedule visit in ____weeks. ° °Would you like the NS fee to be applied to this visit? ° °

## 2018-05-25 NOTE — Progress Notes (Deleted)
Patient ID: Laura Li, female   DOB: 04/17/1940, 78 y.o.   MRN: [id number]    HPI  Laura Li is a 78 y.o.-year-old very pleasant woman,  returning for follow-up for  Osteopenia.  Last visit 1 year ago.  She was diagnosed with osteopenia in 2017 and osteoporosis in 2019.  Reviewed patient's DXA scan reports: 06/30/2017 Lumbar spine L1-L4 Femoral neck (FN)  T-score -1.2 RFN: -2.3 LFN: -2.5  Change in BMD from previous DXA test (%)  -6.7%*  -0.4%  (*) statistically significant  Date L1-L4 T score FN T score FRAX  06/29/2015  L1-L3: -1.4 (-8.1%* from 2008)  RFN: -2.4 (-2.4% from 2008) LFN: -2.4 MOF: 24.5%  Hip fracture risk: 7.6%   09/26/2008  L2-L4: -1.4  RFN: -2.4 LFN: -2.2 MOF: 21.1%  Hip fracture risk: 7.3%   12/24/2001 0.0 RFN: -0.9 LFN: -1.5 On Femara at that time   Fractures: - L foot 4th digit phalanx -05/2015.  No dizziness/orthostasis/poor vision.  She has a history of vertigo for the last many years.  Previous osteoporosis treatments: - Fosamax for 3 months (2003) >> felt "terrible", sick, severe joint pain << improved on Tylenol  Reviewed latest vitamin D level-normal: Lab Results  Component Value Date   VD25OH 46.74 05/23/2016   Pt is on calcium 600 mg + vitamin D 400 units now once a day.  She is doing weightbearing exercises, working with a Physiological scientist.  She does not take high vitamin A doses.  Menopause was In her 51s, when she had a hysterectomy.   She has a family history of osteoporosis in her sister.  No history of hyper or hypocalcemia or hyperparathyroidism.  No history of kidney stones. Lab Results  Component Value Date   CALCIUM 10.0 10/16/2017   CALCIUM 10.6 (H) 06/24/2017   CALCIUM 10.4 10/16/2016   CALCIUM 9.9 06/28/2016   CALCIUM 9.9 06/04/2016   CALCIUM 9.7 05/31/2016   CALCIUM 8.9 05/29/2016   CALCIUM 9.6 05/28/2016   CALCIUM 9.9 04/30/2016   CALCIUM 10.3 12/26/2015   No thyrotoxicosis.  She is on levothyroxine  treatment for hypothyroidism.  Recent TSH levels: Lab Results  Component Value Date   TSH 0.54 10/16/2017   TSH 0.42 07/22/2017   TSH 0.88 12/02/2016   TSH 7.83 (H) 10/16/2016   TSH 5.304 (H) 05/30/2016   No CKD. Last BUN/Cr: Lab Results  Component Value Date   BUN 22 10/16/2017   CREATININE 1.03 10/16/2017   Pt has HL, HTN, hypothyroidism, short term memory loss. She had BrCA in 2000, second time 2016. She was on tamoxifen previously.  Also, she has a history of melanoma.  ROS: Constitutional: no weight gain/no weight loss, no fatigue, no subjective hyperthermia, no subjective hypothermia Eyes: no blurry vision, no xerophthalmia ENT: no sore throat, no nodules palpated in neck, no dysphagia, no odynophagia, no hoarseness Cardiovascular: no CP/no SOB/no palpitations/no leg swelling Respiratory: no cough/no SOB/no wheezing Gastrointestinal: no N/no V/no D/no C/no acid reflux Musculoskeletal: no muscle aches/no joint aches Skin: no rashes, no hair loss Neurological: no tremors/no numbness/no tingling/no dizziness  I reviewed pt's medications, allergies, PMH, social hx, family hx, and changes were documented in the history of present illness. Otherwise, unchanged from my initial visit note.  Past Medical History:  Diagnosis Date  . Anemia    from medicaions  . Arthritis   . Chronic kidney disease    some renal impairment from meds  . Depression    states no depression  .  Eczema   . Esophageal stricture   . GERD (gastroesophageal reflux disease)   . Headache   . Heart murmur    states it is benign told 30-40 years ago  . History of DVT (deep vein thrombosis)   . HX: breast cancer    melanoma  . Hyperlipidemia   . Hypertension   . Hypothyroidism   . Melanoma ([place])   . OA (osteoarthritis)   . Personal history of chemotherapy 2016   Left Breast Cancer  . Personal history of radiation therapy 2016   Left Breast Cancer  . Pneumothorax on right 06/15/2014  . PONV  (postoperative nausea and vomiting)   . Wears glasses    Past Surgical History:  Procedure Laterality Date  . APPENDECTOMY  1967  . BREAST EXCISIONAL BIOPSY Left   . BREAST LUMPECTOMY Left 2016  . BREAST SURGERY  1977   removal of calcified milk gland right breast  . CHEST TUBE INSERTION  06/15/2014  . CHEST TUBE INSERTION Bilateral 06/15/2014   Procedure: CHEST TUBE INSERTION;  Surgeon: Rolm Bookbinder, MD;  Location: [facility];  Service: General;  Laterality: Bilateral;  . COLONOSCOPY    . [place]   right breast  . MASTECTOMY Right 1998   right-nodes out  . MELANOMA EXCISION  2004   right side of face  . MOHS SURGERY  2005   right left-basal cell  . PORT-A-CATH REMOVAL N/A 10/05/2014   Procedure: REMOVAL PORT-A-CATH;  Surgeon: Rolm Bookbinder, MD;  Location: [facility];  Service: General;  Laterality: N/A;  . PORTACATH PLACEMENT Left 06/15/2014   Procedure: INSERTION PORT-A-CATH;  Surgeon: Rolm Bookbinder, MD;  Location: [facility];  Service: General;  Laterality: Left;  . RADIOACTIVE SEED GUIDED PARTIAL MASTECTOMY WITH AXILLARY SENTINEL LYMPH NODE BIOPSY Left 05/05/2014   Procedure: RADIOACTIVE SEED GUIDED LEFT LUMPECTOMY WITH AXILLARY SENTINEL LYMPH NODE BIOPSY;  Surgeon: Rolm Bookbinder, MD;  Location: [facility];  Service: General;  Laterality: Left;  . TONSILLECTOMY AND ADENOIDECTOMY  1949  . TOTAL ABDOMINAL HYSTERECTOMY  1992   including ovaries   Social History   Social History  . Marital status: Widowed    Spouse name: N/A  . Number of children: 1   Occupational History  . retired    Social History Main Topics  . Smoking status: Former Smoker    Packs/day: 0.75    Years: 3.00    Types: Cigarettes    Quit date: 03/11/1969  . Smokeless tobacco: Never Used  . Alcohol use 0.0 oz/week     Comment: occasional wine  . Drug use: No   Social History Narrative   Widowed 2003. Moved to [place] from [place] in 2004 after loss of  husband to be near daughter. 1 daughter. 2 grandkids.       Retired from working at a church      Hobbies: Training and development officer   Current Outpatient Medications on File Prior to Visit  Medication Sig Dispense Refill  . acetaminophen (TYLENOL) 500 MG tablet Take 500 mg by mouth 2 (two) times daily as needed for mild pain. Reported on 07/07/2015    . atorvastatin (LIPITOR) 20 MG tablet Take 1 tablet (20 mg total) by mouth daily. 90 tablet 3  . B Complex-C-E-Zn (B COMPLEX-C-E-ZINC) tablet Take 1 tablet by mouth daily.    . Calcium-Vitamin D (CALTRATE 600 PLUS-VIT D PO) Take 1 tablet by mouth 2 (two) times daily.     . carboxymethylcellulose (REFRESH PLUS) 0.5 %  SOLN 2 drops 3 (three) times daily as needed.    [name] esomeprazole (NEXIUM) 20 MG capsule TAKE 1 BY MOUTH DAILY 90 capsule 3  . fexofenadine (ALLEGRA) 180 MG tablet Take 180 mg by mouth daily as needed for allergies.     [name] ipratropium (ATROVENT) 0.03 % nasal spray Place 2 sprays into the nose every 12 (twelve) hours. 180 mL 3  . Lactobacillus-Inulin (CULTURELLE DIGESTIVE HEALTH PO) Take 1 capsule by mouth daily.    [name] levothyroxine (SYNTHROID, LEVOTHROID) 88 MCG tablet TAKE 1 TABLET(88 MCG) BY MOUTH DAILY 90 tablet 3  . mirabegron ER (MYRBETRIQ) 25 MG TB24 tablet Take 1 tablet (25 mg total) by mouth daily. 30 tablet 5  . Multiple Vitamins-Minerals (CENTRUM SILVER PO) Take 1 tablet by mouth daily.    . nadolol (CORGARD) 20 MG tablet TAKE 1/2 TABLET(10 MG) BY MOUTH DAILY 45 tablet 2  . Propylene Glycol (SYSTANE BALANCE) 0.6 % SOLN Apply 1-2 drops to eye daily as needed (dry eyes).     . venlafaxine XR (EFFEXOR-XR) 150 MG 24 hr capsule TAKE 1 CAPSULE(150 MG) BY MOUTH DAILY 90 capsule 3  . vitamin B-12 (CYANOCOBALAMIN) 100 MCG tablet Take 100 mcg by mouth daily.     Current Facility-Administered Medications on File Prior to Visit  Medication Dose Route Frequency Provider Last Rate Last Dose  . Bevacizumab (AVASTIN) SOLN 1.25 mg  1.25 mg Intravitreal  Bernarda Caffey, MD   1.25 mg at 10/10/17 1318  . Bevacizumab (AVASTIN) SOLN 1.25 mg  1.25 mg Intravitreal  Bernarda Caffey, MD   1.25 mg at 11/07/17 1206  . Bevacizumab (AVASTIN) SOLN 1.25 mg  1.25 mg Intravitreal  Bernarda Caffey, MD   1.25 mg at 12/05/17 1212  . Bevacizumab (AVASTIN) SOLN 1.25 mg  1.25 mg Intravitreal  Bernarda Caffey, MD   1.25 mg at 01/04/18 2328  . Bevacizumab (AVASTIN) SOLN 1.25 mg  1.25 mg Intravitreal  Bernarda Caffey, MD   1.25 mg at 01/30/18 1230  . Bevacizumab (AVASTIN) SOLN 1.25 mg  1.25 mg Intravitreal  Bernarda Caffey, MD   1.25 mg at 02/27/18 1315  . Bevacizumab (AVASTIN) SOLN 1.25 mg  1.25 mg Intravitreal  Bernarda Caffey, MD   1.25 mg at 03/27/18 1138  . Bevacizumab (AVASTIN) SOLN 1.25 mg  1.25 mg Intravitreal  Bernarda Caffey, MD   1.25 mg at 05/08/18 1231   Allergies  Allergen Reactions  . Dyazide [Hydrochlorothiazide W-Triamterene]     Presumption: drug induced vasculitis  . Niacin Other (See Comments)    flushing  . Penicillins Diarrhea and Nausea And Vomiting   Family History  Problem Relation Age of Onset  . Heart disease Father 62       smoker  . Heart attack Father   . Endometrial cancer Mother 72  . Breast cancer Cousin        x 3 cousins with breast cancer (one at 75, one at 4 and one at 62)  . Lung cancer Paternal Aunt        smoker  . Brain cancer Maternal Aunt   . Diabetes Maternal Aunt        ???  . Breast cancer Maternal Aunt 51  . Healthy Sister   . Tuberculosis Maternal Grandfather   . Cervical cancer Paternal Grandmother   . Healthy Daughter   . Colon cancer Neg Hx   . Amblyopia Neg Hx   . Blindness Neg Hx   . Cataracts Neg Hx   . Glaucoma  Neg Hx   . Macular degeneration Neg Hx   . Retinal detachment Neg Hx   . Strabismus Neg Hx   . Retinitis pigmentosa Neg Hx     PE: There were no vitals taken for this visit. Wt Readings from Last 3 Encounters:  01/26/18 142 lb 6.4 oz (64.6 kg)  11/24/17 142 lb 6.4 oz (64.6 kg)  11/04/17 144 lb 8 oz  (65.5 kg)   Constitutional: Normal weight, in NAD, + kyphosis Eyes: PERRLA, EOMI, no exophthalmos ENT: moist mucous membranes, no thyromegaly, no cervical lymphadenopathy Cardiovascular: RRR, No MRG Respiratory: CTA B Gastrointestinal: abdomen soft, NT, ND, BS+ Musculoskeletal: no deformities, strength intact in all 4 Skin: moist, warm, no rashes Neurological: no tremor with outstretched hands, DTR normal in all 4  Assessment: 1.  Osteoporosis  Plan: 1.  Osteoporosis -Likely age-related, postmenopausal, but could also be familial, as her sister has osteoporosis -Reviewed most recent DXA scan and compared the report with her previous ones: Her bone density appear slightly better at the level of the spine and stable at the hips.  However, she still has a high risk of fracture. -We again discussed about different medication classes, benefits, and side effects, including atypical fractures and ONJ.  She has no dental work-up in progress or plan.  My suggestion was to start with Prolia for 3 to 6 years, then zoledronic acid for 1 to 2 years.  Of note, she had intolerance to Fosamax.  Therefore, I suggested against oral bisphosphonates.  We can use teriparatide/abaloparatide as a last resort.  She agrees. -We reviewed her dietary and supplemental calcium and vitamin D intake.  She is taking 400 units vitamin D daily and 600 mg calcium daily.  She was using this twice a day, but in the last 2 years, only once a day.  At this visit, we will check another vitamin D and BMP.  If these are normal, we will go ahead with starting the Prolia preauthorization.  We discussed that we would check another bone density scan 2 years after starting Prolia.  Stable or slightly increasing T-scores are desirable. -In the meantime, continue weightbearing exercises.  Of note, she is not smoking or drinking 1-2 drinks of alcohol a day.  Also discussed about fall precautions. - will see pt back in 1 year.  - time spent  with the patient: *** min, of which >50% was spent in obtaining information about her symptoms, reviewing her previous labs, evaluations, and treatments, counseling her about her condition (please see the discussed topics above), and developing a plan to further investigate and treat it; she had a number of questions which I addressed.  Philemon Kingdom, MD PhD [facility] Endocrinology

## 2018-05-25 NOTE — Telephone Encounter (Signed)
6 mo 

## 2018-06-03 ENCOUNTER — Other Ambulatory Visit: Payer: Self-pay | Admitting: Family Medicine

## 2018-06-03 NOTE — Telephone Encounter (Signed)
Last OV 01/26/2018 Last refill 11/04/2017 #30/5 Next OV 07/16/2018

## 2018-06-30 ENCOUNTER — Telehealth: Payer: Self-pay | Admitting: Oncology

## 2018-06-30 NOTE — Telephone Encounter (Signed)
Spoke with patient and she does not have the capability to webex. She is fine with a phone call visit.

## 2018-06-30 NOTE — Telephone Encounter (Signed)
Attempted to leave voicemail for patient re webex appointment but her voicemail is full. Will call back later.

## 2018-07-01 NOTE — Progress Notes (Signed)
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 Non-of the 25 lymph nodes removed from the right axilla were involved. The tumor was estrogen and progesterone receptor positive, HER-2 not amplified. She was treated adjuvantly with doxorubicin and cyclophosphamide 4, then received 5 years of antiestrogen therapy (initially tamoxifen, later letrozole). There has been no evidence of disease recurrence on the right.  More recently, Laura Li had routine left screening mamm[facility] 2014. This showed a suspicious area of architectural distortion in the inferior portion of the left breast. On 44/92/0100 Laura Li underwent left diagnostic mammography with ultrasonography. Spot compression views confirmed the presence 7 area of distortion in the lower central left breast, which was not palpable by physical exam (there was a prior biopsy scar in the upper outer quadrant of the left breast). Ultrasound confirmed a hypoechoic spiculated mass at the 6:00 location in the left breast, measuring maximally 1.3 cm. Sonography of the left axilla was  negative.  Biopsy of the left breast mass in question 120 10/29/2014 showed ([id number]) an invasive breast cancer with squamous features. It was estrogen and progesterone receptor negative. There was no HER-2 amplification, the signals ratio being 0.82 and the number per cell 1.80. The MIB-1 was 10%.  The patient has met with Dr. Donne Hazel in surgery and Dr. Pablo Ledger in radiation oncology. The patient understands there is no survival difference between lumpectomy and radiation compared with mastectomy. The patient will need sentinel lymph node sampling with either of those procedures. If she does have a lumpectomy, she will benefit from adjuvant radiation. The patient has been set up for genetics testing and this may affect her ultimate surgical decision.  Her subsequent history is as detailed below   INTERVAL HISTORY: Laura Li was contacted today for follow-up and treatment of her triple negative early-stage breast cancer.   She continues under observation.  She has not noted any suspicious changes in her breasts.  Since her last visit here,  She underwent a bone density screening on 06/30/2017, showing a T-score of -2.5, which is considered osteoporotic.    She also underwent a digital diagnostic unilateral left mammogram with tomography on 04/20/2018 showing: Breast Density Category C. There is no mammographic evidence of malignancy in the left breast.    REVIEW OF SYSTEMS: Laura Li is taking appropriate pandemic precautions.  She lives in [address], has contracted for 1 meal a day and they bring back to her room.  Her daughter Laura Li does the shopping for her.  She is not exercising regularly because she is afraid to walk in the hallways and she does not get out very much.  Aside from that she is having no unusual headaches visual changes cough phlegm production pleurisy or shortness of breath or  change in bowel or bladder habits.  She has not had any fever.  Detailed review of systems today was  otherwise stable   PAST MEDICAL HISTORY: Past Medical History:  Diagnosis Date  . Anemia    from medicaions  . Arthritis   . Chronic kidney disease    some renal impairment from meds  . Depression    states no depression  . Eczema   . Esophageal stricture   . GERD (gastroesophageal reflux disease)   . Headache   . Heart murmur    states it is benign told 30-40 years ago  . History of DVT (deep vein thrombosis)   . HX: breast cancer    melanoma  . Hyperlipidemia   . Hypertension   . Hypothyroidism   . Melanoma ([place])   . OA (osteoarthritis)   . Personal history of chemotherapy 2016   Left Breast Cancer  . Personal history of radiation therapy 2016   Left Breast Cancer  . Pneumothorax on right 06/15/2014  . PONV (postoperative nausea and vomiting)   . Wears glasses     PAST SURGICAL HISTORY: Past Surgical History:  Procedure Laterality Date  . APPENDECTOMY  1967  . BREAST EXCISIONAL BIOPSY Left   . BREAST LUMPECTOMY Left 2016  . BREAST SURGERY  1977   removal of calcified milk gland right breast  . CHEST TUBE INSERTION  06/15/2014  . CHEST TUBE INSERTION Bilateral 06/15/2014   Procedure: CHEST TUBE INSERTION;  Surgeon: Rolm Bookbinder, MD;  Location: [facility];  Service: General;  Laterality: Bilateral;  . COLONOSCOPY    . [place]   right breast  . MASTECTOMY Right 1998   right-nodes out  . MELANOMA EXCISION  2004   right side of face  . MOHS SURGERY  2005   right left-basal cell  . PORT-A-CATH REMOVAL N/A 10/05/2014   Procedure: REMOVAL PORT-A-CATH;  Surgeon: Rolm Bookbinder, MD;  Location: [facility];  Service: General;  Laterality: N/A;  . PORTACATH PLACEMENT Left 06/15/2014   Procedure: INSERTION PORT-A-CATH;  Surgeon: Rolm Bookbinder, MD;  Location: [facility];  Service: General;  Laterality: Left;  . RADIOACTIVE SEED GUIDED PARTIAL MASTECTOMY WITH AXILLARY SENTINEL LYMPH NODE BIOPSY Left 05/05/2014   Procedure: RADIOACTIVE SEED GUIDED  LEFT LUMPECTOMY WITH AXILLARY SENTINEL LYMPH NODE BIOPSY;  Surgeon: Rolm Bookbinder, MD;  Location: [facility];  Service: General;  Laterality: Left;  . TONSILLECTOMY AND ADENOIDECTOMY  1949  . TOTAL ABDOMINAL HYSTERECTOMY  1992   including ovaries    FAMILY HISTORY Family History  Problem Relation Age of Onset  . Heart disease Father 70       smoker  . Heart attack Father   . Endometrial cancer Mother 58  . Breast cancer Cousin        x 3 cousins with breast cancer (one at 60, one at 24 and one at 7)  . Lung cancer Paternal Aunt        smoker  . Brain cancer Maternal Aunt   . Diabetes Maternal Aunt        ???  . Breast cancer Maternal Aunt 51  . Healthy Sister   . Tuberculosis Maternal Grandfather   . Cervical cancer Paternal Grandmother   . Healthy Daughter   . Colon cancer Neg Hx   . Amblyopia Neg Hx   . Blindness Neg Hx   . Cataracts Neg Hx   . Glaucoma Neg Hx   . Macular degeneration Neg  Hx   . Retinal detachment Neg Hx   . Strabismus Neg Hx   . Retinitis pigmentosa Neg Hx   The patient's father died at the age of 71 from a myocardial infarction. The patient's mother died at the age of 6 from metastatic endometrial cancer which had been diagnosed 2 years before. The patient had no brothers, one sister. The patient's mother's sister was diagnosed with breast cancer but the patient does not know at what age. A second maternal aunt was diagnosed with brain cancer. The patient has 3 cousins on her mother's side diagnosed with breast cancer, one of them under the age of 55. On the father's side there is a history of lung and cervical cancer  GYNECOLOGIC HISTORY:  No LMP recorded. Patient has had a hysterectomy. Menarche age 68, first live birth age 57. The patient is GX P1. She underwent hysterectomy with bilateral salpingo-oophorectomy in 1991. She used hormone replacement for approximately 7 years until 1998, when she had her right-sided breast cancer. She  used oral contraceptives for approximately one year with no complications. 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 She is widowed and lives alone with no pets. Her main hobby is painting. Her daughter Laura Li lives in [place] and is also a homemaker. The patient has 2 grandchildren. She attends wholly [employer]. The patient's daughter Laura Li is her healthcare power of attorney. Laura Li can be reached at [phone]   HEALTH MAINTENANCE: Social History   Tobacco Use  . Smoking status: Former Smoker    Packs/day: 0.75    Years: 3.00    Pack years: 2.25    Types: Cigarettes    Last attempt to quit: 03/11/1969    Years since quitting: 49.3  . Smokeless tobacco: Never Used  Substance Use Topics  . Alcohol use: Yes    Alcohol/week: 0.0 standard drinks    Comment: occasional wine  . Drug use: No     Colonoscopy: 20/10/[facility]  RDE:YCXKGY post hysterectomy  Bone density:2014/osteopenia  Lipid panel:  Allergies  Allergen Reactions  . Dyazide [Hydrochlorothiazide W-Triamterene]     Presumption: drug induced vasculitis  . Niacin Other (See Comments)    flushing  . Penicillins Diarrhea and Nausea And Vomiting    Current Outpatient Medications  Medication Sig Dispense Refill  . acetaminophen (TYLENOL) 500 MG tablet Take 500 mg by mouth 2 (two) times daily as needed for mild pain. Reported on 07/07/2015    . atorvastatin (LIPITOR) 20 MG tablet Take 1 tablet (20 mg total) by mouth daily. 90 tablet 3  . B Complex-C-E-Zn (B COMPLEX-C-E-ZINC) tablet Take 1 tablet by mouth daily.    . Calcium-Vitamin D (CALTRATE 600 PLUS-VIT D PO) Take 1 tablet by mouth 2 (two) times daily.     . carboxymethylcellulose (REFRESH PLUS) 0.5 % SOLN 2 drops 3 (three) times daily as needed.    [name] esomeprazole  (NEXIUM) 20 MG capsule TAKE 1 BY MOUTH DAILY 90 capsule 3  . fexofenadine (ALLEGRA) 180 MG tablet Take 180 mg by mouth daily as needed for allergies.     [name] ipratropium (ATROVENT) 0.03 % nasal spray Place 2 sprays into the nose every 12 (twelve) hours. 180 mL 3  . Lactobacillus-Inulin (CULTURELLE DIGESTIVE HEALTH PO) Take 1 capsule by mouth daily.    [name]  levothyroxine (SYNTHROID, LEVOTHROID) 88 MCG tablet TAKE 1 TABLET(88 MCG) BY MOUTH DAILY 90 tablet 3  . Multiple Vitamins-Minerals (CENTRUM SILVER PO) Take 1 tablet by mouth daily.    [name] MYRBETRIQ 25 MG TB24 tablet TAKE 1 TABLET BY MOUTH DAILY. 90 tablet 0  . nadolol (CORGARD) 20 MG tablet TAKE 1/2 TABLET(10 MG) BY MOUTH DAILY 45 tablet 2  . Propylene Glycol (SYSTANE BALANCE) 0.6 % SOLN Apply 1-2 drops to eye daily as needed (dry eyes).     . venlafaxine XR (EFFEXOR-XR) 150 MG 24 hr capsule TAKE 1 CAPSULE(150 MG) BY MOUTH DAILY 90 capsule 3  . vitamin B-12 (CYANOCOBALAMIN) 100 MCG tablet Take 100 mcg by mouth daily.     Current Facility-Administered Medications  Medication Dose Route Frequency Provider Last Rate Last Dose  . Bevacizumab (AVASTIN) SOLN 1.25 mg  1.25 mg Intravitreal  Bernarda Caffey, MD   1.25 mg at 10/10/17 1318  . Bevacizumab (AVASTIN) SOLN 1.25 mg  1.25 mg Intravitreal  Bernarda Caffey, MD   1.25 mg at 11/07/17 1206  . Bevacizumab (AVASTIN) SOLN 1.25 mg  1.25 mg Intravitreal  Bernarda Caffey, MD   1.25 mg at 12/05/17 1212  . Bevacizumab (AVASTIN) SOLN 1.25 mg  1.25 mg Intravitreal  Bernarda Caffey, MD   1.25 mg at 01/04/18 2328  . Bevacizumab (AVASTIN) SOLN 1.25 mg  1.25 mg Intravitreal  Bernarda Caffey, MD   1.25 mg at 01/30/18 1230  . Bevacizumab (AVASTIN) SOLN 1.25 mg  1.25 mg Intravitreal  Bernarda Caffey, MD   1.25 mg at 02/27/18 1315  . Bevacizumab (AVASTIN) SOLN 1.25 mg  1.25 mg Intravitreal  Bernarda Caffey, MD   1.25 mg at 03/27/18 1138  . Bevacizumab (AVASTIN) SOLN 1.25 mg  1.25 mg Intravitreal  Bernarda Caffey, MD   1.25 mg at 05/08/18  1231    OBJECTIVE: Older white woman in no acute distress There were no vitals filed for this visit.   There is no height or weight on file to calculate BMI.    ECOG FS:1 - Symptomatic but completely ambulatory    LAB RESULTS:  CMP     Component Value Date/Time   NA 138 10/16/2017 1548   NA 141 06/28/2016 1118   K 4.6 10/16/2017 1548   K 4.4 06/28/2016 1118   CL 100 10/16/2017 1548   CO2 29 10/16/2017 1548   CO2 26 06/28/2016 1118   GLUCOSE 90 10/16/2017 1548   GLUCOSE 106 06/28/2016 1118   GLUCOSE 88 02/06/2006 1059   BUN 22 10/16/2017 1548   BUN 16.2 06/28/2016 1118   CREATININE 1.03 10/16/2017 1548   CREATININE 1.05 06/24/2017 1108   CREATININE 1.0 06/28/2016 1118   CALCIUM 10.0 10/16/2017 1548   CALCIUM 9.9 06/28/2016 1118   PROT 7.4 10/16/2017 1548   PROT 7.6 06/28/2016 1118   ALBUMIN 4.4 10/16/2017 1548   ALBUMIN 4.2 06/28/2016 1118   AST 18 10/16/2017 1548   AST 24 06/24/2017 1108   AST 21 06/28/2016 1118   ALT 12 10/16/2017 1548   ALT 21 06/24/2017 1108   ALT 17 06/28/2016 1118   ALKPHOS 71 10/16/2017 1548   ALKPHOS 66 06/28/2016 1118   BILITOT 0.4 10/16/2017 1548   BILITOT 0.4 06/24/2017 1108   BILITOT 0.47 06/28/2016 1118   GFRNONAA 50 (L) 06/24/2017 1108   GFRAA 57 (L) 06/24/2017 1108    INo results found for: SPEP, UPEP  Lab Results  Component Value Date   WBC 4.9 10/16/2017   NEUTROABS  3.2 06/24/2017   HGB 12.8 10/16/2017   HCT 36.2 10/16/2017   MCV 89.7 10/16/2017   PLT 218.0 10/16/2017      Chemistry      Component Value Date/Time   NA 138 10/16/2017 1548   NA 141 06/28/2016 1118   K 4.6 10/16/2017 1548   K 4.4 06/28/2016 1118   CL 100 10/16/2017 1548   CO2 29 10/16/2017 1548   CO2 26 06/28/2016 1118   BUN 22 10/16/2017 1548   BUN 16.2 06/28/2016 1118   CREATININE 1.03 10/16/2017 1548   CREATININE 1.05 06/24/2017 1108   CREATININE 1.0 06/28/2016 1118      Component Value Date/Time   CALCIUM 10.0 10/16/2017 1548   CALCIUM  9.9 06/28/2016 1118   ALKPHOS 71 10/16/2017 1548   ALKPHOS 66 06/28/2016 1118   AST 18 10/16/2017 1548   AST 24 06/24/2017 1108   AST 21 06/28/2016 1118   ALT 12 10/16/2017 1548   ALT 21 06/24/2017 1108   ALT 17 06/28/2016 1118   BILITOT 0.4 10/16/2017 1548   BILITOT 0.4 06/24/2017 1108   BILITOT 0.47 06/28/2016 1118       No results found for: LABCA2  No components found for: LABCA125  No results for input(s): INR in the last 168 hours.  Urinalysis    Component Value Date/Time   COLORURINE YELLOW 05/28/2016 2212   APPEARANCEUR CLEAR 05/28/2016 2212   LABSPEC 1.010 01/31/2018 1420   LABSPEC 1.005 08/09/2014 1052   PHURINE 6.0 01/31/2018 1420   GLUCOSEU NEGATIVE 01/31/2018 1420   GLUCOSEU Negative 08/09/2014 1052   HGBUR LARGE (A) 01/31/2018 1420   BILIRUBINUR NEGATIVE 01/31/2018 1420   BILIRUBINUR Negative 11/04/2017 1612   BILIRUBINUR Negative 08/09/2014 [place] 01/31/2018 1420   PROTEINUR NEGATIVE 01/31/2018 1420   UROBILINOGEN 0.2 01/31/2018 1420   UROBILINOGEN 0.2 08/09/2014 1052   NITRITE NEGATIVE 01/31/2018 1420   LEUKOCYTESUR SMALL (A) 01/31/2018 1420   LEUKOCYTESUR Negative 08/09/2014 1052    STUDIES: Mammography and bone density results discussed with the patient  ASSESSMENT: 78 y.o. [place] woman  (1) status post right modified radical mastectomy November 1998 for a pT2 pN0, stage IIA invasive ductal carcinoma, grade 2, estrogen and progesterone receptor positive, HER-2 not amplified  (a) adjuvant chemotherapy consisted of doxorubicin and cyclophosphamide 4  (b) adjuvant anti-estrogens consisted of tamoxifen, then letrozole, for a total of 5 years  (2) status post left breast lower outer quadrant biopsy 04/07/2014 for a clinical T1c N0,stage IA  invasive ductal carcinoma, with squamous features, estrogen and progesterone receptor negative, HER-2 not amplified, with an MIB-1 of 10%   (3) left lumpectomy and sentinel lymph node  biopsy 05/05/2014 showed aT1c pN0, stage IA invasive ductal carcinoma, grade 1, triple negative, with close but negative margins.   (4) Mammaprint classifies the tumor as basal like, high risk, and predicts a 30% chance of recurrence within 10 years with local treatment only.   (5) start of treatment delayed because of bilateral pneumothoraces after port placement. Adjuvant chemotherapy fiinally started 07/11/2014 consisting of carboplatin and docetaxel given every 21 days 4, completed 09/13/2014  (6) adjuvant radiation 10/31/2014-12/15/2014:  Left breast/ 45 Gy at 1.8 Gy per fraction x 25 fractions.  Left breast boost/ 16 Gy at 2 Gy per fraction x 8 fractions  (7) genetics testing March 2016 showed no deleterious mutation in the [employer] panel  [[facility]] including sequencing and rearrangement analysis for the following 24 genes:ATM, BARD1, BRCA1, BRCA2, BRIP1,  CDH1, CHEK2, EPCAM, MLH1, MRE11A, MSH2, MSH6, MUTYH, NBN, NF1, PALB2, PMS2, PTEN, RAD50, RAD51C, RAD51D, SMARCA4, STK11, and TP53.   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   (8) osteopenia by bone density 06/29/2015 with [place] T score of -2.4             (a) intolerant of Fosamax (which she did take for 5 years however).  (b) repeat bone density 06/28/2017 shows a T score of -2.5 (osteoporosis)  (c) she is interested in considering Prolia.    PLAN: Laura Li  is now 4 years out from definitive surgery for breast cancer with no evidence of disease recurrence.  This is very favorable.  She is doing well as far as mammography is concerned although her breast density remains at C.  We discussed her bone density.  It is really not that different from 3 years ago but she is now officially osteoporotic.  She tells me that she is "allergic" to Fosamax and would not be interested in [place] or similar medications.  Her sister however is taking Prolia and she is interested  in giving that a try.  Right now really is not the time to start any "shot" but tentatively I am scheduling her to come in in September for a Prolia shot.  I will see her that same day to make sure she understands the possible toxicities, side effects and complications and also check her baseline calcium level  Otherwise I will plan to see her again in 1 year  She knows to call for any other issue that may develop before then.I, Lurline Del MD, have reviewed the above documentation for accuracy and completeness, and I agree with the above.    Chauncey Cruel, MD   07/02/2018 2:13 PM

## 2018-07-02 ENCOUNTER — Other Ambulatory Visit: Payer: Medicare Other

## 2018-07-02 ENCOUNTER — Inpatient Hospital Stay: Payer: Medicare Other | Attending: Oncology | Admitting: Oncology

## 2018-07-02 DIAGNOSIS — Z9221 Personal history of antineoplastic chemotherapy: Secondary | ICD-10-CM | POA: Diagnosis not present

## 2018-07-02 DIAGNOSIS — Z923 Personal history of irradiation: Secondary | ICD-10-CM

## 2018-07-02 DIAGNOSIS — M8580 Other specified disorders of bone density and structure, unspecified site: Secondary | ICD-10-CM | POA: Diagnosis not present

## 2018-07-02 DIAGNOSIS — Z79811 Long term (current) use of aromatase inhibitors: Secondary | ICD-10-CM | POA: Diagnosis not present

## 2018-07-02 DIAGNOSIS — C50912 Malignant neoplasm of unspecified site of left female breast: Secondary | ICD-10-CM | POA: Diagnosis not present

## 2018-07-02 DIAGNOSIS — Z853 Personal history of malignant neoplasm of breast: Secondary | ICD-10-CM

## 2018-07-03 ENCOUNTER — Encounter (INDEPENDENT_AMBULATORY_CARE_PROVIDER_SITE_OTHER): Payer: Medicare Other | Admitting: Ophthalmology

## 2018-07-06 ENCOUNTER — Telehealth: Payer: Self-pay | Admitting: Oncology

## 2018-07-06 NOTE — Telephone Encounter (Signed)
Called regarding schedule °

## 2018-07-15 ENCOUNTER — Telehealth: Payer: Self-pay | Admitting: Neurology

## 2018-07-15 NOTE — Telephone Encounter (Signed)
Due to current COVID 19 pandemic, our office is severely reducing in office visits until further notice, in order to minimize the risk to our patients and healthcare providers.   Called patient to offer a virtual visit for her 5/14 appointment. Patient declined virtual visit and stated she would rather com in office. I offered her the option to come in and she accepted. I advised her that we are taking necessary precautions to keep our patients and staff safe. She is aware that she will need to pull under our carport when she arrives and wait for a staff member to assist her. I advised her that her temp will be taken and screening questions will be asked. Patient verbalized understanding.

## 2018-07-16 ENCOUNTER — Ambulatory Visit (INDEPENDENT_AMBULATORY_CARE_PROVIDER_SITE_OTHER): Payer: Medicare Other | Admitting: Family Medicine

## 2018-07-16 ENCOUNTER — Encounter: Payer: Self-pay | Admitting: Family Medicine

## 2018-07-16 ENCOUNTER — Telehealth: Payer: Self-pay | Admitting: Neurology

## 2018-07-16 VITALS — Ht 66.0 in | Wt 140.0 lb

## 2018-07-16 DIAGNOSIS — I82591 Chronic embolism and thrombosis of other specified deep vein of right lower extremity: Secondary | ICD-10-CM | POA: Diagnosis not present

## 2018-07-16 DIAGNOSIS — I480 Paroxysmal atrial fibrillation: Secondary | ICD-10-CM | POA: Diagnosis not present

## 2018-07-16 DIAGNOSIS — E785 Hyperlipidemia, unspecified: Secondary | ICD-10-CM | POA: Diagnosis not present

## 2018-07-16 DIAGNOSIS — N3281 Overactive bladder: Secondary | ICD-10-CM | POA: Diagnosis not present

## 2018-07-16 DIAGNOSIS — F324 Major depressive disorder, single episode, in partial remission: Secondary | ICD-10-CM

## 2018-07-16 DIAGNOSIS — E039 Hypothyroidism, unspecified: Secondary | ICD-10-CM

## 2018-07-16 NOTE — Telephone Encounter (Signed)
Called the patient back. She lives at [place] and is doing stable. I spent a total of 15 min attempting to help patient with understanding how to open a text message or email and with her memory impairment the patient wasn't unable to figure this out. Her daughter cant be with her for the apt due to restrictions with visitors in the [facility]. Pt states that she is stable and had no concerns and felt it was best to push the apt out into the summer with hopes that things will be better. Scheduled with NP for follow up visit July 16 1 pm with Ward Givens, NP

## 2018-07-16 NOTE — Progress Notes (Signed)
Phone [phone]   Subjective:  Virtual visit via phonenote Chief Complaint  Patient presents with  . Diarrhea    Follow up  . Medication Refill    Effexor /Nadolol   This visit type was conducted due to national recommendations for restrictions regarding the COVID-19 Pandemic (e.g. social distancing).  This format is felt to be most appropriate for this patient at this time balancing risks to patient and risks to population by having him in for in person visit.  All issues noted in this document were discussed and addressed.  No physical exam was performed (except for noted visual exam or audio findings with Telehealth visits).  The patient has consented to conduct a Telehealth visit and understands insurance will be billed.   Our team/I connected with Laura Li on 02/58/52 at  1:40 PM EDT by phone (patient did not have equipment for webex) and verified that I am speaking with the correct person using two identifiers.  Location patient: [facility] Location provider: [facility], office Persons participating in the virtual visit:  patient  Time on phone: 11 minutes Counseling provided about covid 19 (shes doing a good job with social distancing)   Our team/I discussed the limitations of evaluation and management by telemedicine and the availability of in person appointments. In light of current covid-19 pandemic, patient also understands that we are trying to protect them by minimizing in office contact if at all possible.  The patient expressed consent for telemedicine visit and agreed to proceed. Patient understands insurance will be billed.   ROS- no fever/chills/cough/headache/sore throat   Past Medical History-  Patient Active Problem List   Diagnosis Date Noted  . MCI (mild cognitive impairment) 10/16/2016    Priority: High  . Paroxysmal atrial fibrillation ([place]) 06/04/2016    Priority: High  . Chronic deep vein thrombosis (DVT) ([facility]) 09/01/2015    Priority: High  .  Pneumothorax 06/15/2014    Priority: High  . History of left breast cancer 04/24/2014    Priority: High  . History of right breast cancer 04/24/2014    Priority: High  . Vasculitis ([place]) 11/09/2012    Priority: High  . Depression, major, single episode, in partial remission ([place]) 04/24/2017    Priority: Medium  . Overactive bladder 09/19/2016    Priority: Medium  . Acute lower UTI 05/29/2016    Priority: Medium  . Osteoporosis 05/23/2016    Priority: Medium  . Chronic kidney disease, stage III (moderate) ([place]) 12/09/2012    Priority: Medium  . 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    Priority: Medium  . Hypothyroidism 02/06/2006    Priority: Medium  . Hyperlipidemia 02/06/2006    Priority: Medium  . Essential hypertension 02/06/2006    Priority: Medium  . Localized swelling, mass, or lump of lower extremity 11/05/2014    Priority: Low  . Diverticulitis 08/09/2014    Priority: Low  . Family history of breast cancer 04/29/2014    Priority: Low  . GERD (gastroesophageal reflux disease) 03/31/2014    Priority: Low  . Hot flashes 03/31/2014    Priority: Low  . Osteoarthritis 03/31/2014    Priority: Low  . Allergic rhinitis 02/06/2006    Priority: Low  . Disorder of bone and cartilage 05/23/2017    Medications- reviewed and updated Current Outpatient Medications  Medication Sig Dispense Refill  . acetaminophen (TYLENOL) 500 MG tablet Take 500 mg by mouth 2 (two) times daily as needed for mild pain. Reported on 07/07/2015    .  atorvastatin (LIPITOR) 20 MG tablet Take 1 tablet (20 mg total) by mouth daily. 90 tablet 3  . B Complex-C-E-Zn (B COMPLEX-C-E-ZINC) tablet Take 1 tablet by mouth daily.    . Calcium-Vitamin D (CALTRATE 600 PLUS-VIT D PO) Take 1 tablet by mouth 2 (two) times daily.     . carboxymethylcellulose (REFRESH PLUS) 0.5 % SOLN 2 drops 3 (three) times daily as needed.    [name] esomeprazole (NEXIUM) 20 MG capsule TAKE 1 BY MOUTH DAILY 90 capsule 3  .  fexofenadine (ALLEGRA) 180 MG tablet Take 180 mg by mouth daily as needed for allergies.     [name] ipratropium (ATROVENT) 0.03 % nasal spray Place 2 sprays into the nose every 12 (twelve) hours. 180 mL 3  . Lactobacillus-Inulin (CULTURELLE DIGESTIVE HEALTH PO) Take 1 capsule by mouth daily.    [name] levothyroxine (SYNTHROID, LEVOTHROID) 88 MCG tablet TAKE 1 TABLET(88 MCG) BY MOUTH DAILY 90 tablet 3  . Multiple Vitamins-Minerals (CENTRUM SILVER PO) Take 1 tablet by mouth daily.    [name] MYRBETRIQ 25 MG TB24 tablet TAKE 1 TABLET BY MOUTH DAILY. 90 tablet 0  . nadolol (CORGARD) 20 MG tablet TAKE 1/2 TABLET(10 MG) BY MOUTH DAILY 45 tablet 2  . Propylene Glycol (SYSTANE BALANCE) 0.6 % SOLN Apply 1-2 drops to eye daily as needed (dry eyes).     . venlafaxine XR (EFFEXOR-XR) 150 MG 24 hr capsule TAKE 1 CAPSULE(150 MG) BY MOUTH DAILY 90 capsule 3  . vitamin B-12 (CYANOCOBALAMIN) 100 MCG tablet Take 100 mcg by mouth daily.     Current Facility-Administered Medications  Medication Dose Route Frequency Provider Last Rate Last Dose  . Bevacizumab (AVASTIN) SOLN 1.25 mg  1.25 mg Intravitreal  Bernarda Caffey, MD   1.25 mg at 10/10/17 1318  . Bevacizumab (AVASTIN) SOLN 1.25 mg  1.25 mg Intravitreal  Bernarda Caffey, MD   1.25 mg at 11/07/17 1206  . Bevacizumab (AVASTIN) SOLN 1.25 mg  1.25 mg Intravitreal  Bernarda Caffey, MD   1.25 mg at 12/05/17 1212  . Bevacizumab (AVASTIN) SOLN 1.25 mg  1.25 mg Intravitreal  Bernarda Caffey, MD   1.25 mg at 01/04/18 2328  . Bevacizumab (AVASTIN) SOLN 1.25 mg  1.25 mg Intravitreal  Bernarda Caffey, MD   1.25 mg at 01/30/18 1230  . Bevacizumab (AVASTIN) SOLN 1.25 mg  1.25 mg Intravitreal  Bernarda Caffey, MD   1.25 mg at 02/27/18 1315  . Bevacizumab (AVASTIN) SOLN 1.25 mg  1.25 mg Intravitreal  Bernarda Caffey, MD   1.25 mg at 03/27/18 1138  . Bevacizumab (AVASTIN) SOLN 1.25 mg  1.25 mg Intravitreal  Bernarda Caffey, MD   1.25 mg at 05/08/18 1231     Objective:  Ht 5\' 6"  (1.676 m)   Wt 140 lb (63.5  kg)   BMI 22.60 kg/m  self reported vitals  Nonlabored voice, normal speech      Assessment and Plan   # atrial fibrillation/chronic DVT S: on nadolol for rate control at 10mg  daily.  She had to stop the xarelto due to a burst blood vessel in her eye- she is getting injections now- but they are on hold due to covid 19. Denies stroke like symptoms  Also had been on xarelto for chronic DVT- she does not report worsening leg pain or swelling A/P: Stable. Continue current medications.  - would have low threshold to reimage leg if becomes symptomatic in regards to potential new DVT on top of chronic one  #hyperlipidemia S: hopefully controlled on  atorvastatin 20mg  increased from 10mg   A/P:  need updated lipid panel - likely improved from last check- reassess in 3 months at hopeful in person visit  #hypothyroidism S: On thyroid medication-levothyroxine 88 mcg  Lab Results  Component Value Date   TSH 0.54 10/16/2017   A/P: has been controlled-needs updated TSH-we agreed to do in 3 months  #Overactive bladder S: Doing well on Myrbetriq A/P: Stable-continue current medication  # Depression S: Depression remains very well controlled with PHQ 9 score of 1 today.  She is compliant with her Effexor-she does not like missing doses due to side effects. A/P: Stable. Continue current medications.   Other notes: 1.thinks change in food when she moved to [place] has caused shift in stool- still having some loose stools but had reassuring bloodwork back in the fall. No blood or melena.   2. Needs updated lipid, cbc, cmp 3. b12 has been ok on nexium Lab Results  Component Value Date   VITAMINB12 857 10/16/2017    3 month visit - hopefully in person- if we do a video visit- would still have her come by for lab afterwards-she will call to schedule Future Appointments  Date Time Provider [place]  08/14/2018 10:00 AM Bernarda Caffey, MD [facility] None  09/24/2018  1:00 PM Ward Givens, NP [facility] None  11/26/2018  1:00 PM [facility] 4 [facility] None  11/26/2018  1:30 PM Causey, Charlestine Massed, NP [facility] None  11/26/2018  2:15 PM Ord [place] FLUSH [facility] None  12/15/2018 11:00 AM [facility] HEALTH COACH [facility]   Lab/Order associations: Paroxysmal atrial fibrillation ([place])  Chronic deep vein thrombosis (DVT) of other vein of right lower extremity ([place])  Hyperlipidemia, unspecified hyperlipidemia type  Overactive bladder  Depression, major, single episode, in partial remission ([place])  Hypothyroidism, unspecified type  Return precautions advised.  Laura Reddish, MD

## 2018-07-16 NOTE — Patient Instructions (Addendum)
There are no preventive care reminders to display for this patient.  Depression screen [facility] 2/9 11/04/2017 10/16/2017 04/24/2017  Decreased Interest 0 0 2  Down, Depressed, Hopeless 1 0 1  PHQ - 2 Score 1 0 3  Altered sleeping 1 1 3   Tired, decreased energy 2 3 1   Change in appetite 0 0 0  Feeling bad or failure about yourself  1 0 1  Trouble concentrating 0 0 0  Moving slowly or fidgety/restless 0 0 0  Suicidal thoughts 0 0 0  PHQ-9 Score 5 4 8   Difficult doing work/chores - Not difficult at all Somewhat difficult  Some recent data might be hidden   Phone visit

## 2018-07-16 NOTE — Telephone Encounter (Signed)
Pt called stating that her daughter does not think it is a good idea for her to come to the office and they are wondering if they can use the IPad for the Virtual Visit instead. Please advise.

## 2018-07-22 ENCOUNTER — Telehealth (INDEPENDENT_AMBULATORY_CARE_PROVIDER_SITE_OTHER): Payer: Self-pay

## 2018-07-23 ENCOUNTER — Ambulatory Visit: Payer: Medicare Other | Admitting: Neurology

## 2018-07-30 ENCOUNTER — Other Ambulatory Visit: Payer: Self-pay | Admitting: Family Medicine

## 2018-07-31 ENCOUNTER — Other Ambulatory Visit: Payer: Self-pay

## 2018-07-31 ENCOUNTER — Encounter (INDEPENDENT_AMBULATORY_CARE_PROVIDER_SITE_OTHER): Payer: Self-pay | Admitting: Ophthalmology

## 2018-07-31 ENCOUNTER — Ambulatory Visit (INDEPENDENT_AMBULATORY_CARE_PROVIDER_SITE_OTHER): Payer: Medicare Other | Admitting: Ophthalmology

## 2018-07-31 DIAGNOSIS — H35033 Hypertensive retinopathy, bilateral: Secondary | ICD-10-CM | POA: Diagnosis not present

## 2018-07-31 DIAGNOSIS — I10 Essential (primary) hypertension: Secondary | ICD-10-CM

## 2018-07-31 DIAGNOSIS — H3500 Unspecified background retinopathy: Secondary | ICD-10-CM

## 2018-07-31 DIAGNOSIS — H353221 Exudative age-related macular degeneration, left eye, with active choroidal neovascularization: Secondary | ICD-10-CM | POA: Diagnosis not present

## 2018-07-31 DIAGNOSIS — H3581 Retinal edema: Secondary | ICD-10-CM | POA: Diagnosis not present

## 2018-07-31 DIAGNOSIS — H3562 Retinal hemorrhage, left eye: Secondary | ICD-10-CM

## 2018-07-31 DIAGNOSIS — Z961 Presence of intraocular lens: Secondary | ICD-10-CM | POA: Diagnosis not present

## 2018-07-31 DIAGNOSIS — H35012 Changes in retinal vascular appearance, left eye: Secondary | ICD-10-CM

## 2018-07-31 NOTE — Progress Notes (Signed)
[facility] Note  07/31/2018     CHIEF COMPLAINT Patient presents for Retina Follow Up   HISTORY OF PRESENT ILLNESS: Laura Li is a 78 y.o. female who presents to the clinic today for:   HPI    Retina Follow Up    Patient presents with  Other.  In left eye.  This started months ago.  Severity is moderate.  Duration of 12 weeks.  Since onset it is gradually worsening.  I, the attending physician,  performed the HPI with the patient and updated documentation appropriately.          Comments    78 y/o female pt here for 12 wk f/u for subretinal hemorrhage OS.  VA OS continues to get worse.  No change in [place] OD.  Denies pain, flashes, floaters.  Using AT for dryness in the mornings and evenings.       Last edited by Bernarda Caffey, MD on 07/31/2018 10:13 AM. (History)    pt states she delayed her follow up due to COVID-19, she states she feels like her vision started getting worse after she received her last injection, she states the injection caused some pain afterwards, she states when she watches TV, she cannot see the left side of the TV and when she looks at people they don't have a right eye   Referring physician: Marin Olp, MD [place], [place] [address]  HISTORICAL INFORMATION:   Selected notes from the [place]: Current Outpatient Medications (Ophthalmic Drugs)  Medication Sig  . carboxymethylcellulose (REFRESH PLUS) 0.5 % SOLN 2 drops 3 (three) times daily as needed.  [name] Propylene Glycol (SYSTANE BALANCE) 0.6 % SOLN Apply 1-2 drops to eye daily as needed (dry eyes).    No current facility-administered medications for this visit.  (Ophthalmic Drugs)   Current Outpatient Medications (Other)  Medication Sig  . acetaminophen (TYLENOL) 500 MG tablet Take 500 mg by mouth 2 (two) times daily as needed for mild pain. Reported on 07/07/2015  . atorvastatin (LIPITOR) 20 MG tablet Take 1  tablet (20 mg total) by mouth daily.  . B Complex-C-E-Zn (B COMPLEX-C-E-ZINC) tablet Take 1 tablet by mouth daily.  . Calcium-Vitamin D (CALTRATE 600 PLUS-VIT D PO) Take 1 tablet by mouth 2 (two) times daily.   [name] esomeprazole (NEXIUM) 20 MG capsule TAKE 1 BY MOUTH DAILY  . fexofenadine (ALLEGRA) 180 MG tablet Take 180 mg by mouth daily as needed for allergies.   [name] ipratropium (ATROVENT) 0.03 % nasal spray Place 2 sprays into the nose every 12 (twelve) hours.  . Lactobacillus-Inulin (CULTURELLE DIGESTIVE HEALTH PO) Take 1 capsule by mouth daily.  [name] levothyroxine (SYNTHROID, LEVOTHROID) 88 MCG tablet TAKE 1 TABLET(88 MCG) BY MOUTH DAILY  . Multiple Vitamins-Minerals (CENTRUM SILVER PO) Take 1 tablet by mouth daily.  [name] MYRBETRIQ 25 MG TB24 tablet TAKE 1 TABLET BY MOUTH DAILY.  . nadolol (CORGARD) 20 MG tablet TAKE 1/2 TABLET(10 MG) BY MOUTH DAILY  . venlafaxine XR (EFFEXOR-XR) 150 MG 24 hr capsule TAKE 1 CAPSULE(150 MG) BY MOUTH DAILY  . vitamin B-12 (CYANOCOBALAMIN) 100 MCG tablet Take 100 mcg by mouth daily.   Current Facility-Administered Medications (Other)  Medication Route  . Bevacizumab (AVASTIN) SOLN 1.25 mg Intravitreal  . Bevacizumab (AVASTIN) SOLN 1.25 mg Intravitreal  . Bevacizumab (AVASTIN) SOLN 1.25 mg Intravitreal  . Bevacizumab (AVASTIN) SOLN 1.25 mg Intravitreal  . Bevacizumab (AVASTIN) SOLN  1.25 mg Intravitreal  . Bevacizumab (AVASTIN) SOLN 1.25 mg Intravitreal  . Bevacizumab (AVASTIN) SOLN 1.25 mg Intravitreal  . Bevacizumab (AVASTIN) SOLN 1.25 mg Intravitreal      REVIEW OF SYSTEMS: ROS    Positive for: Gastrointestinal, Genitourinary, Musculoskeletal, Eyes   Negative for: Constitutional, Neurological, Skin, HENT, Endocrine, Cardiovascular, Respiratory, Psychiatric, Allergic/Imm, Heme/Lymph   Last edited by Matthew Folks, COA on 07/31/2018  9:47 AM. (History)       ALLERGIES Allergies  Allergen Reactions  . Dyazide [Hydrochlorothiazide W-Triamterene]      Presumption: drug induced vasculitis  . Niacin Other (See Comments)    flushing  . Penicillins Diarrhea and Nausea And Vomiting    PAST MEDICAL HISTORY Past Medical History:  Diagnosis Date  . Anemia    from medicaions  . Arthritis   . Chronic kidney disease    some renal impairment from meds  . Depression    states no depression  . Eczema   . Esophageal stricture   . GERD (gastroesophageal reflux disease)   . Headache   . Heart murmur    states it is benign told 30-40 years ago  . History of DVT (deep vein thrombosis)   . HX: breast cancer    melanoma  . Hyperlipidemia   . Hypertension   . Hypothyroidism   . Melanoma ([place])   . OA (osteoarthritis)   . Personal history of chemotherapy 2016   Left Breast Cancer  . Personal history of radiation therapy 2016   Left Breast Cancer  . Pneumothorax on right 06/15/2014  . PONV (postoperative nausea and vomiting)   . Wears glasses    Past Surgical History:  Procedure Laterality Date  . APPENDECTOMY  1967  . BREAST EXCISIONAL BIOPSY Left   . BREAST LUMPECTOMY Left 2016  . BREAST SURGERY  1977   removal of calcified milk gland right breast  . CHEST TUBE INSERTION  06/15/2014  . CHEST TUBE INSERTION Bilateral 06/15/2014   Procedure: CHEST TUBE INSERTION;  Surgeon: Rolm Bookbinder, MD;  Location: [facility];  Service: General;  Laterality: Bilateral;  . COLONOSCOPY    . [place]   right breast  . MASTECTOMY Right 1998   right-nodes out  . MELANOMA EXCISION  2004   right side of face  . MOHS SURGERY  2005   right left-basal cell  . PORT-A-CATH REMOVAL N/A 10/05/2014   Procedure: REMOVAL PORT-A-CATH;  Surgeon: Rolm Bookbinder, MD;  Location: [facility];  Service: General;  Laterality: N/A;  . PORTACATH PLACEMENT Left 06/15/2014   Procedure: INSERTION PORT-A-CATH;  Surgeon: Rolm Bookbinder, MD;  Location: [facility];  Service: General;  Laterality: Left;  . RADIOACTIVE SEED GUIDED PARTIAL MASTECTOMY WITH  AXILLARY SENTINEL LYMPH NODE BIOPSY Left 05/05/2014   Procedure: RADIOACTIVE SEED GUIDED LEFT LUMPECTOMY WITH AXILLARY SENTINEL LYMPH NODE BIOPSY;  Surgeon: Rolm Bookbinder, MD;  Location: [facility];  Service: General;  Laterality: Left;  . TONSILLECTOMY AND ADENOIDECTOMY  1949  . TOTAL ABDOMINAL HYSTERECTOMY  1992   including ovaries    FAMILY HISTORY Family History  Problem Relation Age of Onset  . Heart disease Father 24       smoker  . Heart attack Father   . Endometrial cancer Mother 73  . Breast cancer Cousin        x 3 cousins with breast cancer (one at 20, one at 75 and one at 29)  . Lung cancer Paternal Aunt  smoker  . Brain cancer Maternal Aunt   . Diabetes Maternal Aunt        ???  . Breast cancer Maternal Aunt 51  . Healthy Sister   . Tuberculosis Maternal Grandfather   . Cervical cancer Paternal Grandmother   . Healthy Daughter   . Colon cancer Neg Hx   . Amblyopia Neg Hx   . Blindness Neg Hx   . Cataracts Neg Hx   . Glaucoma Neg Hx   . Macular degeneration Neg Hx   . Retinal detachment Neg Hx   . Strabismus Neg Hx   . Retinitis pigmentosa Neg Hx     SOCIAL HISTORY Social History   Tobacco Use  . Smoking status: Former Smoker    Packs/day: 0.75    Years: 3.00    Pack years: 2.25    Types: Cigarettes    Last attempt to quit: 03/11/1969    Years since quitting: 49.4  . Smokeless tobacco: Never Used  Substance Use Topics  . Alcohol use: Yes    Alcohol/week: 0.0 standard drinks    Comment: occasional wine  . Drug use: No         OPHTHALMIC EXAM:  Base Eye Exam    Visual Acuity (Snellen - Linear)      Right Left   Dist cc 20/20 -2 CF   Dist ph cc  NI   Correction:  Glasses       Tonometry (Tonopen, 9:50 AM)      Right Left   Pressure 14 15       Pupils      Dark Light Shape React APD   Right 3 2 Round Brisk None   Left 3 2 Round Brisk None       Visual Fields (Counting fingers)      Left Right    Full Full        Extraocular Movement      Right Left    Full, Ortho Full, Ortho       Neuro/Psych    Oriented x3:  Yes   Mood/Affect:  Normal       Dilation    Both eyes:  1.0% Mydriacyl, 2.5% Phenylephrine @ 9:50 AM        Slit Lamp and Fundus Exam    Slit Lamp Exam      Right Left   Lids/Lashes Dermatochalasis - upper lid, Telangiectasia Dermatochalasis - upper lid, Telangiectasia   Conjunctiva/Sclera White and quiet White and quiet   Cornea Arcus, Anterior basement membrane dystrophy, Temporal Well healed cataract wounds, nasal LRI Arcus, Anterior basement membrane dystrophy, Inferior 2+ Punctate epithelial erosions, Temporal Well healed cataract wounds   Anterior Chamber Deep and quiet Deep and quiet   Iris Round and dilated Round and dilated   Lens Multifocal PC IOL in good position with open PC Multifocal PC IOL in good position with open PC   Vitreous Vitreous syneresis, Posterior vitreous detachment Vitreous syneresis, Posterior vitreous detachment       Fundus Exam      Right Left   Disc Pink and Sharp Pink and Sharp, Tilted disc, mild temporal pallor   C/D Ratio 0.45 0.5   Macula flat, Blunted foveal reflex, mild Retinal pigment epithelial mottling, No heme or edema Flat, blunted foveal reflex, pigmented atrophy and sub-retinal scar ST macula, sub-retinal heme almost completely resolved   Vessels Mild Vascular attenuation Vascular attenuation   Periphery Attached Attached, No RT/RD  IMAGING AND PROCEDURES  Imaging and Procedures for @TODAY @  OCT, Retina - OU - Both Eyes       Right Eye Quality was good. Central Foveal Thickness: 241. Progression has been stable. Findings include normal foveal contour, no IRF, no SRF (No drusen).   Left Eye Quality was good. Central Foveal Thickness: 187. Progression has improved. Findings include abnormal foveal contour, subretinal hyper-reflective material, outer retinal atrophy, no IRF, retinal drusen , no SRF (Interval  improvement in SRF, now just [facility] / sub-retinal scar with diffuse ORA).   Notes *Images captured and stored on drive  Diagnosis / Impression:  OD: NFP, No IRF/SRF-stable OS: Interval improvement in SRF, now just [facility] / sub-retinal scar with diffuse ORA  Clinical management:  See below  Abbreviations: NFP - Normal foveal profile. CME - cystoid macular edema. PED - pigment epithelial detachment. IRF - intraretinal fluid. SRF - subretinal fluid. EZ - ellipsoid zone. ERM - epiretinal membrane. ORA - outer retinal atrophy. ORT - outer retinal tubulation. SRHM - subretinal hyper-reflective material                  ASSESSMENT/PLAN:    ICD-10-CM   1. Subretinal hemorrhage of left eye H35.62   2. 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 Retinal edema H35.81 OCT, Retina - OU - Both Eyes  4. Valsalva retinopathy H35.00   5. Essential hypertension I10   6. Hypertensive retinopathy of both eyes H35.033   7. Pseudophakia of both eyes Z96.1   8. Exudative age-related macular degeneration of left eye with active choroidal neovascularization ([place]) H35.3221     1-4. Retinal macroaneurysm with large sub-macular hemorrhage OS-   - onset of decreased vision associated with violent vomiting/wretching   - on xarelto for history of DVT -- pt stopped temporarily  - exam shows improvement / evolution of large submacular hemorrhage with mild preretinal heme  - retinal macroaneurysm visible on exam and prior FA -- improved today  - differential also includes exudative ARMD -- see #8 below  - systemic work-up for hypertension and systemic vascular disease reviewed by Dr. Yong Channel -- risk factors optimized  - S/P IVA OS #1 (08.02.19), #2 (08.30.19), #3 (09.27.19), #4 (10.25.19), #5 (11.22.19), #6 (12.20.19), #7 (01.17.20), #8 (2.28.2020)  - delayed f/u due to COVID 19 concerns -- 3 mos rather than 1 month f/u  - interval resolution of subretinal heme -- areas of cleared subretinal heme now  atrophic  - BCVA CF OS -- significant decline in [place] since last visit despite improvement and interval clearance of subretinal heme    - OCT shows significant outer retinal atrophy -- likely cause of severe vision loss  - discussed findings and guarded prognosis and treatment options  - recommend holding injection today -- pt in agreement  - F/U 3 months -- DFE/OCT/?FA  5,6. Hypertensive retinopathy OU  - discussed importance of tight BP control  - monitor  7. Pseudophakia OU  - s/p CE/IOL  - beautiful surgery, doing well  - monitor  8. Exudative age related macular degeneration, both eyes.    - s/p IVA OS #1 8.2.19, #2 (08.30.19), #3 (09.27.19), #4 (10.25.19), #5 (11.22.19), #6 (12.20.19), #7 (01.17.20) #8 (2.28.2020)  - f/u 3 months   Ophthalmic Meds Ordered this visit:  No orders of the defined types were placed in this encounter.      Return in about 3 months (around 10/31/2018) for f/u macular heme OS, DFE, OCT, TOPCON.  There are  no Patient Instructions on file for this visit.   Explained the diagnoses, plan, and follow up with the patient and they expressed understanding.  Patient expressed understanding of the importance of proper follow up care.   This document serves as a record of services personally performed by Gardiner Sleeper, MD, PhD. It was created on their behalf by Ernest Mallick, OA, an ophthalmic assistant. The creation of this record is the provider's dictation and/or activities during the visit.    Electronically signed by: Ernest Mallick, OA 05.22.2020 1:52 AM     Gardiner Sleeper, M.D., Ph.D. Diseases & Surgery of the Retina and Vitreous [facility]  I have reviewed the above documentation for accuracy and completeness, and I agree with the above. Gardiner Sleeper, M.D., Ph.D. 08/01/18 1:52 AM   Abbreviations: M myopia (nearsighted); A astigmatism; H hyperopia (farsighted); P presbyopia; Mrx spectacle prescription;  CTL contact  lenses; OD right eye; OS left eye; OU both eyes  XT exotropia; ET esotropia; PEK punctate epithelial keratitis; PEE punctate epithelial erosions; DES dry eye syndrome; MGD meibomian gland dysfunction; ATs artificial tears; PFAT's preservative free artificial tears; [place] nuclear sclerotic cataract; PSC posterior subcapsular cataract; ERM epi-retinal membrane; PVD posterior vitreous detachment; RD retinal detachment; DM diabetes mellitus; DR diabetic retinopathy; NPDR non-proliferative diabetic retinopathy; PDR proliferative diabetic retinopathy; CSME clinically significant macular edema; DME diabetic macular edema; dbh dot blot hemorrhages; CWS cotton wool spot; POAG primary open angle glaucoma; C/D cup-to-disc ratio; HVF humphrey visual field; GVF goldmann visual field; OCT optical coherence tomography; IOP intraocular pressure; BRVO Branch retinal vein occlusion; CRVO central retinal vein occlusion; CRAO central retinal artery occlusion; BRAO branch retinal artery occlusion; RT retinal tear; SB scleral buckle; PPV pars plana vitrectomy; VH Vitreous hemorrhage; PRP panretinal laser photocoagulation; IVK intravitreal kenalog; VMT vitreomacular traction; MH Macular hole;  NVD neovascularization of the disc; NVE neovascularization elsewhere; AREDS age related eye disease study; ARMD age related macular degeneration; POAG primary open angle glaucoma; EBMD epithelial/anterior basement membrane dystrophy; ACIOL anterior chamber intraocular lens; IOL intraocular lens; PCIOL posterior chamber intraocular lens; Phaco/IOL phacoemulsification with intraocular lens placement; [place] photorefractive keratectomy; LASIK laser assisted in situ keratomileusis; HTN hypertension; DM diabetes mellitus; COPD chronic obstructive pulmonary disease

## 2018-08-01 ENCOUNTER — Encounter (INDEPENDENT_AMBULATORY_CARE_PROVIDER_SITE_OTHER): Payer: Self-pay | Admitting: Ophthalmology

## 2018-08-07 ENCOUNTER — Encounter (INDEPENDENT_AMBULATORY_CARE_PROVIDER_SITE_OTHER): Payer: Self-pay | Admitting: Ophthalmology

## 2018-08-11 ENCOUNTER — Other Ambulatory Visit: Payer: Self-pay | Admitting: Family Medicine

## 2018-08-14 ENCOUNTER — Encounter (INDEPENDENT_AMBULATORY_CARE_PROVIDER_SITE_OTHER): Payer: Medicare Other | Admitting: Ophthalmology

## 2018-08-21 ENCOUNTER — Other Ambulatory Visit: Payer: Self-pay | Admitting: Family Medicine

## 2018-08-21 NOTE — Telephone Encounter (Signed)
Last OV 07/16/18 f/u 3 mos Last refill 07/23/17 #90/3 Next OV 12/15/18

## 2018-08-31 ENCOUNTER — Other Ambulatory Visit: Payer: Self-pay | Admitting: Family Medicine

## 2018-09-24 ENCOUNTER — Ambulatory Visit (INDEPENDENT_AMBULATORY_CARE_PROVIDER_SITE_OTHER): Payer: Medicare Other | Admitting: Adult Health

## 2018-09-24 ENCOUNTER — Other Ambulatory Visit: Payer: Self-pay

## 2018-09-24 ENCOUNTER — Encounter: Payer: Self-pay | Admitting: Adult Health

## 2018-09-24 VITALS — BP 122/73 | HR 71 | Temp 97.7°F | Ht 67.5 in | Wt 139.5 lb

## 2018-09-24 DIAGNOSIS — G3184 Mild cognitive impairment, so stated: Secondary | ICD-10-CM | POA: Diagnosis not present

## 2018-09-24 NOTE — Patient Instructions (Signed)
Your Plan:  Continue to monitor memory If your symptoms worsen or you develop new symptoms please let us know.    Thank you for coming to see us at [facility]. I hope we have been able to provide you high quality care today.  You may receive a patient satisfaction survey over the next few weeks. We would appreciate your feedback and comments so that we may continue to improve ourselves and the health of our patients.  

## 2018-09-24 NOTE — Progress Notes (Signed)
PATIENT: Laura Li DOB: [birth year]  REASON FOR VISIT: follow up HISTORY FROM: patient  HISTORY OF PRESENT ILLNESS: Today 09/24/18:  Laura Li is a 78 year old female with a history of mild cognitive impairment.  She returns today for follow-up with her daughter.  The patient is currently not on any medication for her memory.  She feels that her memory may be slightly worse.  She continues to live at [facility].  She is able to complete all ADLs independently.  She does not cook because she eats in the dining hall.  Denies any trouble with her sleep.  Her daughter manages her finances.  She manages her own medications.  She does not operate a motor vehicle due to her vision.  She returns today for an evaluation.  HISTORY  (Copied from Laura Li) Laura Li is a 78 y.o. female , seen here as in a referral/ revisit  from Dr. Yong Channel , this time for memory concerns, cognitive changes.  The patient has been followed until 2013 in our sleep clinic.   Laura Li is meanwhile on different medications of course since more than 5 years have passed.  She is taking Synthroid, Corgard, Allegra, Atrovent, Lipitor, Tylenol as needed, Xarelto for anticoagulation and she is on Effexor extended release form 1 capsule of 150 mg daily. She had developed in 2016 a pneumothorax durig a port-a cath procedure for breast cancer treatment.    She had developed a urosepsis as described above by my nurse, about 12 months ago and during the time of hospitalization that followed was also diagnosed with , osteopenia, and appeared confused.  It was at the same time in late March the paroxysmal atrial fibrillation was first witnessed.  By February of this year she also was diagnosed with depression.  Her cardiologist was very interested in her following up with a neurologist.  Her MRI did not show abnormalities it was considered an age-appropriate normal brain.  There was also no evidence of neoplasm  metastasizing.  Dr. Lovena Le describes her as a 78 year old Caucasian lady with normal left ventricular function who denies chest pain, shortness of breath or syncope and has no peripheral edema.  She has no difficulties maintaining normal sinus rhythm and has been compliant with her medication, she is tolerating her systemic anticoagulation, which has also been managed to provide coverage from further DVTs.  Hypertension was well controlled.   She is following Dr. Crissie Sickles every 6 months.  Dr. Jana Hakim follows her still for her history of breast cancer and melanoma, and she has been following only once a year is doing well.  Chief complaint according to patient : Laura Li has noted that she sometimes has trouble finding a word, there is delayed recall and sometimes she does not remember certain things at all.  At times and depending very much on the situation she may be able to find the word of the memory of a certain part of her conversation within the same hour sometimes within a couple of minutes and sometimes within a day.  She prided herself on her excellent long term memory, an good visual memory. She has changed her driving habits, uses GPS more and plans her routes. She avoids highways, night time driving. She hasn't been cooking for over a year.  She is a one person household. She is happy with the decision to move to [place].   She feels frustrated with some of her cognitive problems, is well aware.  She use to keep records, meticulously , now her cheque account and tax records are just " stuffed"  in an envelope. She doesn't forget meals, she dresses well, keeps a housecleaning service for many years.   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  Widowed since 2003, non smoker, rarely drinking alcohol, exercises in form of walking. One daughter , Laura Li.    REVIEW OF SYSTEMS: Out of a complete 14 system review of symptoms, the patient complains  only of the following symptoms, and all other reviewed systems are negative.  See HPI  ALLERGIES: Allergies  Allergen Reactions  . Dyazide [Hydrochlorothiazide W-Triamterene]     Presumption: drug induced vasculitis  . Niacin Other (See Comments)    flushing  . Penicillins Diarrhea and Nausea And Vomiting    HOME MEDICATIONS: Outpatient Medications Prior to Visit  Medication Sig Dispense Refill  . acetaminophen (TYLENOL) 500 MG tablet Take 500 mg by mouth 2 (two) times daily as needed for mild pain. Reported on 07/07/2015    . atorvastatin (LIPITOR) 20 MG tablet TAKE 1 TABLET(20 MG) BY MOUTH DAILY 90 tablet 0  . B Complex-C-E-Zn (B COMPLEX-C-E-ZINC) tablet Take 1 tablet by mouth daily.    . Calcium-Vitamin D (CALTRATE 600 PLUS-VIT D PO) Take 1 tablet by mouth 2 (two) times daily.     . carboxymethylcellulose (REFRESH PLUS) 0.5 % SOLN 2 drops 3 (three) times daily as needed.    [name] esomeprazole (NEXIUM) 20 MG capsule TAKE 1 CAPSULE BY MOUTH DAILY 90 capsule 3  . fexofenadine (ALLEGRA) 180 MG tablet Take 180 mg by mouth daily as needed for allergies.     [name] ipratropium (ATROVENT) 0.03 % nasal spray Place 2 sprays into the nose every 12 (twelve) hours. 180 mL 3  . Lactobacillus-Inulin (CULTURELLE DIGESTIVE HEALTH PO) Take 1 capsule by mouth daily.    [name] levothyroxine (SYNTHROID, LEVOTHROID) 88 MCG tablet TAKE 1 TABLET(88 MCG) BY MOUTH DAILY 90 tablet 3  . Multiple Vitamins-Minerals (CENTRUM SILVER PO) Take 1 tablet by mouth daily.    [name] MYRBETRIQ 25 MG TB24 tablet TAKE 1 TABLET BY MOUTH DAILY 90 tablet 0  . nadolol (CORGARD) 20 MG tablet TAKE 1/2 TABLET(10 MG) BY MOUTH DAILY 45 tablet 2  . Propylene Glycol (SYSTANE BALANCE) 0.6 % SOLN Apply 1-2 drops to eye daily as needed (dry eyes).     . venlafaxine XR (EFFEXOR-XR) 150 MG 24 hr capsule TAKE 1 CAPSULE(150 MG) BY MOUTH DAILY 90 capsule 3  . vitamin B-12 (CYANOCOBALAMIN) 100 MCG tablet Take 100 mcg by mouth daily.     Facility-Administered  Medications Prior to Visit  Medication Dose Route Frequency Provider Last Rate Last Dose  . Bevacizumab (AVASTIN) SOLN 1.25 mg  1.25 mg Intravitreal  Bernarda Caffey, MD   1.25 mg at 10/10/17 1318  . Bevacizumab (AVASTIN) SOLN 1.25 mg  1.25 mg Intravitreal  Bernarda Caffey, MD   1.25 mg at 11/07/17 1206  . Bevacizumab (AVASTIN) SOLN 1.25 mg  1.25 mg Intravitreal  Bernarda Caffey, MD   1.25 mg at 12/05/17 1212  . Bevacizumab (AVASTIN) SOLN 1.25 mg  1.25 mg Intravitreal  Bernarda Caffey, MD   1.25 mg at 01/04/18 2328  . Bevacizumab (AVASTIN) SOLN 1.25 mg  1.25 mg Intravitreal  Bernarda Caffey, MD   1.25 mg at 01/30/18 1230  . Bevacizumab (AVASTIN) SOLN 1.25 mg  1.25 mg Intravitreal  Bernarda Caffey, MD   1.25 mg at 02/27/18 1315  .  Bevacizumab (AVASTIN) SOLN 1.25 mg  1.25 mg Intravitreal  Bernarda Caffey, MD   1.25 mg at 03/27/18 1138  . Bevacizumab (AVASTIN) SOLN 1.25 mg  1.25 mg Intravitreal  Bernarda Caffey, MD   1.25 mg at 05/08/18 1231    PAST MEDICAL HISTORY: Past Medical History:  Diagnosis Date  . Anemia    from medicaions  . Arthritis   . Chronic kidney disease    some renal impairment from meds  . Depression    states no depression  . Eczema   . Esophageal stricture   . GERD (gastroesophageal reflux disease)   . Headache   . Heart murmur    states it is benign told 30-40 years ago  . History of DVT (deep vein thrombosis)   . HX: breast cancer    melanoma  . Hyperlipidemia   . Hypertension   . Hypothyroidism   . Melanoma ([place])   . OA (osteoarthritis)   . Personal history of chemotherapy 2016   Left Breast Cancer  . Personal history of radiation therapy 2016   Left Breast Cancer  . Pneumothorax on right 06/15/2014  . PONV (postoperative nausea and vomiting)   . Wears glasses     PAST SURGICAL HISTORY: Past Surgical History:  Procedure Laterality Date  . APPENDECTOMY  1967  . BREAST EXCISIONAL BIOPSY Left   . BREAST LUMPECTOMY Left 2016  . BREAST SURGERY  1977   removal of  calcified milk gland right breast  . CHEST TUBE INSERTION  06/15/2014  . CHEST TUBE INSERTION Bilateral 06/15/2014   Procedure: CHEST TUBE INSERTION;  Surgeon: Rolm Bookbinder, MD;  Location: [facility];  Service: General;  Laterality: Bilateral;  . COLONOSCOPY    . [place]   right breast  . MASTECTOMY Right 1998   right-nodes out  . MELANOMA EXCISION  2004   right side of face  . MOHS SURGERY  2005   right left-basal cell  . PORT-A-CATH REMOVAL N/A 10/05/2014   Procedure: REMOVAL PORT-A-CATH;  Surgeon: Rolm Bookbinder, MD;  Location: [facility];  Service: General;  Laterality: N/A;  . PORTACATH PLACEMENT Left 06/15/2014   Procedure: INSERTION PORT-A-CATH;  Surgeon: Rolm Bookbinder, MD;  Location: [facility];  Service: General;  Laterality: Left;  . RADIOACTIVE SEED GUIDED PARTIAL MASTECTOMY WITH AXILLARY SENTINEL LYMPH NODE BIOPSY Left 05/05/2014   Procedure: RADIOACTIVE SEED GUIDED LEFT LUMPECTOMY WITH AXILLARY SENTINEL LYMPH NODE BIOPSY;  Surgeon: Rolm Bookbinder, MD;  Location: [facility];  Service: General;  Laterality: Left;  . TONSILLECTOMY AND ADENOIDECTOMY  1949  . TOTAL ABDOMINAL HYSTERECTOMY  1992   including ovaries    FAMILY HISTORY: Family History  Problem Relation Age of Onset  . Heart disease Father 17       smoker  . Heart attack Father   . Endometrial cancer Mother 38  . Breast cancer Cousin        x 3 cousins with breast cancer (one at 71, one at 42 and one at 33)  . Lung cancer Paternal Aunt        smoker  . Brain cancer Maternal Aunt   . Diabetes Maternal Aunt        ???  . Breast cancer Maternal Aunt 51  . Healthy Sister   . Tuberculosis Maternal Grandfather   . Cervical cancer Paternal Grandmother   . Healthy Daughter   . Colon cancer Neg Hx   . Amblyopia Neg Hx   . Blindness  Neg Hx   . Cataracts Neg Hx   . Glaucoma Neg Hx   . Macular degeneration Neg Hx   . Retinal detachment Neg Hx   . Strabismus Neg Hx    . Retinitis pigmentosa Neg Hx     SOCIAL HISTORY: Social History   Socioeconomic History  . Marital status: Widowed    Spouse name: Not on file  . Number of children: Not on file  . Years of education: Not on file  . Highest education level: Not on file  Occupational History  . Occupation: retired  Scientific laboratory technician  . Financial resource strain: Not on file  . Food insecurity    Worry: Not on file    Inability: Not on file  . Transportation needs    Medical: Not on file    Non-medical: Not on file  Tobacco Use  . Smoking status: Former Smoker    Packs/day: 0.75    Years: 3.00    Pack years: 2.25    Types: Cigarettes    Quit date: 03/11/1969    Years since quitting: 49.5  . Smokeless tobacco: Never Used  Substance and Sexual Activity  . Alcohol use: Yes    Alcohol/week: 0.0 standard drinks    Comment: occasional wine  . Drug use: No  . Sexual activity: Not Currently    Birth control/protection: None  Lifestyle  . Physical activity    Days per week: Not on file    Minutes per session: Not on file  . Stress: Not on file  Relationships  . Social Herbalist on phone: Not on file    Gets together: Not on file    Attends religious service: Not on file    Active member of club or organization: Not on file    Attends meetings of clubs or organizations: Not on file    Relationship status: Not on file  . Intimate partner violence    Fear of current or ex partner: Not on file    Emotionally abused: Not on file    Physically abused: Not on file    Forced sexual activity: Not on file  Other Topics Concern  . Not on file  Social History Narrative   Widowed 2003. Moved to [place] from [place] in 2004 after loss of husband to be near daughter. 1 daughter. 2 grandkids.       Retired from working at a church      Hobbies: artist      PHYSICAL EXAM  Vitals:   09/24/18 1306  BP: 122/73  Pulse: 71  Temp: 97.7 F (36.5 C)  Weight: 139 lb 8 oz (63.3 kg)   Height: 5' 7.5" (1.715 m)   Body mass index is 21.53 kg/m.   MMSE - Mini Mental State Exam 09/24/2018 10/18/2016 10/06/2015  Not completed: - Refused -  Orientation to time 5 4 4   Orientation to Place 5 5 5   Registration 3 3 3   Attention/ Calculation 5 3 5   Attention/Calculation-comments - serial 3 from 20; (1 serials 7 from 100) -  Recall 2 3 2   Language- name 2 objects 2 2 2   Language- repeat 1 1 1   Language- follow 3 step command 3 2 3   Language- read & follow direction 1 1 1   Write a sentence 1 1 1   Copy design 1 1 1   Copy design-comments named 9 animals - -  Total score 29 26 28      Generalized: Well developed, in  no acute distress   Neurological examination  Mentation: Alert oriented to time, place, history taking. Follows all commands speech and language fluent Cranial nerve II-XII: Pupils were equal round reactive to light. Extraocular movements were full, visual field were full on confrontational test.  Head turning and shoulder shrug  were normal and symmetric. Motor: The motor testing reveals 5 over 5 strength of all 4 extremities. Good symmetric motor tone is noted throughout.  Sensory: Sensory testing is intact to soft touch on all 4 extremities. No evidence of extinction is noted.  Coordination: Cerebellar testing reveals good finger-nose-finger and heel-to-shin bilaterally.  Gait and station: Gait is normal.  Reflexes: Deep tendon reflexes are symmetric and normal bilaterally.   DIAGNOSTIC DATA (LABS, IMAGING, TESTING) - I reviewed patient records, labs, notes, testing and imaging myself where available.  Lab Results  Component Value Date   WBC 4.9 10/16/2017   HGB 12.8 10/16/2017   HCT 36.2 10/16/2017   MCV 89.7 10/16/2017   PLT 218.0 10/16/2017      Component Value Date/Time   NA 138 10/16/2017 1548   NA 141 06/28/2016 1118   K 4.6 10/16/2017 1548   K 4.4 06/28/2016 1118   CL 100 10/16/2017 1548   CO2 29 10/16/2017 1548   CO2 26 06/28/2016 1118    GLUCOSE 90 10/16/2017 1548   GLUCOSE 106 06/28/2016 1118   GLUCOSE 88 02/06/2006 1059   BUN 22 10/16/2017 1548   BUN 16.2 06/28/2016 1118   CREATININE 1.03 10/16/2017 1548   CREATININE 1.05 06/24/2017 1108   CREATININE 1.0 06/28/2016 1118   CALCIUM 10.0 10/16/2017 1548   CALCIUM 9.9 06/28/2016 1118   PROT 7.4 10/16/2017 1548   PROT 7.6 06/28/2016 1118   ALBUMIN 4.4 10/16/2017 1548   ALBUMIN 4.2 06/28/2016 1118   AST 18 10/16/2017 1548   AST 24 06/24/2017 1108   AST 21 06/28/2016 1118   ALT 12 10/16/2017 1548   ALT 21 06/24/2017 1108   ALT 17 06/28/2016 1118   ALKPHOS 71 10/16/2017 1548   ALKPHOS 66 06/28/2016 1118   BILITOT 0.4 10/16/2017 1548   BILITOT 0.4 06/24/2017 1108   BILITOT 0.47 06/28/2016 1118   GFRNONAA 50 (L) 06/24/2017 1108   GFRAA 57 (L) 06/24/2017 1108   Lab Results  Component Value Date   CHOL 278 (H) 11/28/2015   HDL 36.60 (L) 11/28/2015   LDLCALC 124 (H) 06/21/2013   LDLDIRECT 124.0 07/22/2017   TRIG 270.0 (H) 11/28/2015   CHOLHDL 8 11/28/2015   No results found for: HGBA1C Lab Results  Component Value Date   VITAMINB12 857 10/16/2017   Lab Results  Component Value Date   TSH 0.54 10/16/2017      ASSESSMENT AND PLAN 78 y.o. year old female  has a past medical history of Anemia, Arthritis, Chronic kidney disease, Depression, Eczema, Esophageal stricture, GERD (gastroesophageal reflux disease), Headache, Heart murmur, History of DVT (deep vein thrombosis), breast cancer, Hyperlipidemia, Hypertension, Hypothyroidism, Melanoma ([place]), OA (osteoarthritis), Personal history of chemotherapy (2016), Personal history of radiation therapy (2016), Pneumothorax on right (06/15/2014), PONV (postoperative nausea and vomiting), and Wears glasses. here with:  1.  Mild cognitive impairment  Overall the patient is doing well.  Her memory score has remained stable.  I have advised that she should continue to monitor her memory.  If her symptoms worsen or she  develops new symptoms she should let us know.  She will follow-up in 6 months or sooner if needed.   I spent  15 minutes with the patient. 50% of this time was spent reviewing her memory score   Ward Givens, MSN, NP-C 09/24/2018, 1:04 PM [facility] [address], [place], [place] [address] [phone]

## 2018-10-27 ENCOUNTER — Other Ambulatory Visit: Payer: Self-pay | Admitting: Family Medicine

## 2018-10-29 NOTE — Progress Notes (Signed)
[facility] Note  11/02/2018     CHIEF COMPLAINT Patient presents for Retina Follow Up   HISTORY OF PRESENT ILLNESS: Laura Li is a 78 y.o. female who presents to the clinic today for:   HPI    Retina Follow Up    Patient presents with  Other.  In left eye.  This started weeks ago.  Severity is moderate.  Duration of weeks.  Since onset it is stable.  I, the attending physician,  performed the HPI with the patient and updated documentation appropriately.          Comments    Patient states vision is still very poor OS.  Patient denies eye pain or discomfort and denies any new or worsening floaters or fol OU.       Last edited by Bernarda Caffey, MD on 11/02/2018 11:22 AM. (History)    pt states her vision is still very poor    Referring physician: Marin Olp, MD [place],  [place] [id number]  HISTORICAL INFORMATION:   Selected notes from the MEDICAL RECORD NUMBER    CURRENT MEDICATIONS: Current Outpatient Medications (Ophthalmic Drugs)  Medication Sig  . carboxymethylcellulose (REFRESH PLUS) 0.5 % SOLN 2 drops 3 (three) times daily as needed.  [name] Propylene Glycol (SYSTANE BALANCE) 0.6 % SOLN Apply 1-2 drops to eye daily as needed (dry eyes).    No current facility-administered medications for this visit.  (Ophthalmic Drugs)   Current Outpatient Medications (Other)  Medication Sig  . acetaminophen (TYLENOL) 500 MG tablet Take 500 mg by mouth 2 (two) times daily as needed for mild pain. Reported on 07/07/2015  . atorvastatin (LIPITOR) 20 MG tablet TAKE 1 TABLET(20 MG) BY MOUTH DAILY  . B Complex-C-E-Zn (B COMPLEX-C-E-ZINC) tablet Take 1 tablet by mouth daily.  . Calcium-Vitamin D (CALTRATE 600 PLUS-VIT D PO) Take 1 tablet by mouth 2 (two) times daily.   [name] esomeprazole (NEXIUM) 20 MG capsule TAKE 1 CAPSULE BY MOUTH DAILY  . fexofenadine (ALLEGRA) 180 MG tablet Take 180 mg by mouth daily as needed for allergies.   [name]  ipratropium (ATROVENT) 0.03 % nasal spray Place 2 sprays into the nose every 12 (twelve) hours.  . Lactobacillus-Inulin (CULTURELLE DIGESTIVE HEALTH PO) Take 1 capsule by mouth daily.  [name] levothyroxine (SYNTHROID) 88 MCG tablet TAKE 1 TABLET(88 MCG) BY MOUTH DAILY  . Multiple Vitamins-Minerals (CENTRUM SILVER PO) Take 1 tablet by mouth daily.  [name] MYRBETRIQ 25 MG TB24 tablet TAKE 1 TABLET BY MOUTH DAILY  . nadolol (CORGARD) 20 MG tablet TAKE 1/2 TABLET(10 MG) BY MOUTH DAILY  . venlafaxine XR (EFFEXOR-XR) 150 MG 24 hr capsule TAKE 1 CAPSULE(150 MG) BY MOUTH DAILY  . vitamin B-12 (CYANOCOBALAMIN) 100 MCG tablet Take 100 mcg by mouth daily.   Current Facility-Administered Medications (Other)  Medication Route  . Bevacizumab (AVASTIN) SOLN 1.25 mg Intravitreal  . Bevacizumab (AVASTIN) SOLN 1.25 mg Intravitreal  . Bevacizumab (AVASTIN) SOLN 1.25 mg Intravitreal  . Bevacizumab (AVASTIN) SOLN 1.25 mg Intravitreal  . Bevacizumab (AVASTIN) SOLN 1.25 mg Intravitreal  . Bevacizumab (AVASTIN) SOLN 1.25 mg Intravitreal  . Bevacizumab (AVASTIN) SOLN 1.25 mg Intravitreal  . Bevacizumab (AVASTIN) SOLN 1.25 mg Intravitreal      REVIEW OF SYSTEMS: ROS    Positive for: Gastrointestinal, Genitourinary, Musculoskeletal, Eyes   Negative for: Constitutional, Neurological, Skin, HENT, Endocrine, Cardiovascular, Respiratory, Psychiatric, Allergic/Imm, Heme/Lymph   Last edited by Doneen Poisson on 11/02/2018  9:55  AM. (History)       ALLERGIES Allergies  Allergen Reactions  . Dyazide [Hydrochlorothiazide W-Triamterene]     Presumption: drug induced vasculitis  . Niacin Other (See Comments)    flushing  . Penicillins Diarrhea and Nausea And Vomiting    PAST MEDICAL HISTORY Past Medical History:  Diagnosis Date  . Anemia    from medicaions  . Arthritis   . Chronic kidney disease    some renal impairment from meds  . Depression    states no depression  . Eczema   . Esophageal stricture   .  GERD (gastroesophageal reflux disease)   . Headache   . Heart murmur    states it is benign told 30-40 years ago  . History of DVT (deep vein thrombosis)   . HX: breast cancer    melanoma  . Hyperlipidemia   . Hypertension   . Hypothyroidism   . Melanoma ([place])   . OA (osteoarthritis)   . Personal history of chemotherapy 2016   Left Breast Cancer  . Personal history of radiation therapy 2016   Left Breast Cancer  . Pneumothorax on right 06/15/2014  . PONV (postoperative nausea and vomiting)   . Wears glasses    Past Surgical History:  Procedure Laterality Date  . APPENDECTOMY  1967  . BREAST EXCISIONAL BIOPSY Left   . BREAST LUMPECTOMY Left 2016  . BREAST SURGERY  1977   removal of calcified milk gland right breast  . CHEST TUBE INSERTION  06/15/2014  . CHEST TUBE INSERTION Bilateral 06/15/2014   Procedure: CHEST TUBE INSERTION;  Surgeon: Rolm Bookbinder, MD;  Location: [facility];  Service: General;  Laterality: Bilateral;  . COLONOSCOPY    . [place]   right breast  . MASTECTOMY Right 1998   right-nodes out  . MELANOMA EXCISION  2004   right side of face  . MOHS SURGERY  2005   right left-basal cell  . PORT-A-CATH REMOVAL N/A 10/05/2014   Procedure: REMOVAL PORT-A-CATH;  Surgeon: Rolm Bookbinder, MD;  Location: [facility];  Service: General;  Laterality: N/A;  . PORTACATH PLACEMENT Left 06/15/2014   Procedure: INSERTION PORT-A-CATH;  Surgeon: Rolm Bookbinder, MD;  Location: [facility];  Service: General;  Laterality: Left;  . RADIOACTIVE SEED GUIDED PARTIAL MASTECTOMY WITH AXILLARY SENTINEL LYMPH NODE BIOPSY Left 05/05/2014   Procedure: RADIOACTIVE SEED GUIDED LEFT LUMPECTOMY WITH AXILLARY SENTINEL LYMPH NODE BIOPSY;  Surgeon: Rolm Bookbinder, MD;  Location: [facility];  Service: General;  Laterality: Left;  . TONSILLECTOMY AND ADENOIDECTOMY  1949  . TOTAL ABDOMINAL HYSTERECTOMY  1992   including ovaries    FAMILY HISTORY Family  History  Problem Relation Age of Onset  . Heart disease Father 9       smoker  . Heart attack Father   . Endometrial cancer Mother 64  . Breast cancer Cousin        x 3 cousins with breast cancer (one at 47, one at 37 and one at 43)  . Lung cancer Paternal Aunt        smoker  . Brain cancer Maternal Aunt   . Diabetes Maternal Aunt        ???  . Breast cancer Maternal Aunt 51  . Healthy Sister   . Tuberculosis Maternal Grandfather   . Cervical cancer Paternal Grandmother   . Healthy Daughter   . Colon cancer Neg Hx   . Amblyopia Neg Hx   . Blindness Neg  Hx   . Cataracts Neg Hx   . Glaucoma Neg Hx   . Macular degeneration Neg Hx   . Retinal detachment Neg Hx   . Strabismus Neg Hx   . Retinitis pigmentosa Neg Hx     SOCIAL HISTORY Social History   Tobacco Use  . Smoking status: Former Smoker    Packs/day: 0.75    Years: 3.00    Pack years: 2.25    Types: Cigarettes    Quit date: 03/11/1969    Years since quitting: 49.6  . Smokeless tobacco: Never Used  Substance Use Topics  . Alcohol use: Yes    Alcohol/week: 0.0 standard drinks    Comment: occasional wine  . Drug use: No         OPHTHALMIC EXAM:  Base Eye Exam    Visual Acuity (Snellen - Linear)      Right Left   Dist cc 20/20 -2 CF @ 3'   Dist ph cc  20/80 -2   Correction: Glasses       Tonometry (Tonopen, 10:03 AM)      Right Left   Pressure 16 14       Pupils      Dark Light Shape React APD   Right 3 2 Round Brisk 0   Left 3 2 Round Brisk 0       Visual Fields      Left Right    Full Full       Extraocular Movement      Right Left    Full Full       Neuro/Psych    Oriented x3: Yes   Mood/Affect: Normal       Dilation    Both eyes: 1.0% Mydriacyl, 2.5% Phenylephrine @ 10:04 AM        Slit Lamp and Fundus Exam    Slit Lamp Exam      Right Left   Lids/Lashes Dermatochalasis - upper lid, Telangiectasia Dermatochalasis - upper lid, Telangiectasia   Conjunctiva/Sclera White and  quiet White and quiet   Cornea Arcus, Anterior basement membrane dystrophy, Temporal Well healed cataract wounds, nasal LRI Arcus, Anterior basement membrane dystrophy, Inferior 2+ Punctate epithelial erosions, Temporal Well healed cataract wounds   Anterior Chamber Deep and quiet Deep and quiet   Iris Round and dilated Round and dilated   Lens Multifocal PC IOL in good position with open PC Multifocal PC IOL in good position with open PC   Vitreous Vitreous syneresis, Posterior vitreous detachment Vitreous syneresis, Posterior vitreous detachment       Fundus Exam      Right Left   Disc Pink and Sharp Pink and Sharp, Tilted disc, mild temporal pallor   C/D Ratio 0.45 0.5   Macula flat, Blunted foveal reflex, mild Retinal pigment epithelial mottling, No heme or edema Flat, blunted foveal reflex, pigmented atrophy and sub-retinal scar ST macula, sub-retinal heme completely resolved   Vessels Mild Vascular attenuation Vascular attenuation   Periphery Attached Attached, No RT/RD        Refraction    Wearing Rx      Sphere Cylinder Axis Add   Right -0.75 +0.75 150 +2.50   Left -1.00 +1.25 127 +2.50   Type: PAL       Manifest Refraction      Sphere Cylinder Axis Dist VA   Right -0.50 +0.75 150 20/20-1   Left -1.00 +0.50 125 20/100+1          IMAGING  AND PROCEDURES  Imaging and Procedures for @TODAY @  OCT, Retina - OU - Both Eyes       Right Eye Quality was good. Central Foveal Thickness: 239. Progression has been stable. Findings include normal foveal contour, no IRF, no SRF (No drusen).   Left Eye Quality was good. Central Foveal Thickness: 193. Progression has worsened. Findings include subretinal hyper-reflective material, outer retinal atrophy, no IRF, retinal drusen , no SRF, normal foveal contour (Stably resolved SRF, now just [facility] / sub-retinal scar with mild progression of ORA extending nasally, involving fovea).   Notes *Images captured and stored on  drive  Diagnosis / Impression:  OD: NFP, No IRF/SRF-stable OS: Stably resolved SRF, now just [facility] / sub-retinal scar with mild progression of ORA extending nasally, involving fovea  Clinical management:  See below  Abbreviations: NFP - Normal foveal profile. CME - cystoid macular edema. PED - pigment epithelial detachment. IRF - intraretinal fluid. SRF - subretinal fluid. EZ - ellipsoid zone. ERM - epiretinal membrane. ORA - outer retinal atrophy. ORT - outer retinal tubulation. SRHM - subretinal hyper-reflective material         Fluorescein Angiography Optos (Transit OS)       Right Eye   Progression has been stable. Early phase findings include normal observations. Mid/Late phase findings include normal observations.   Left Eye   Progression has been stable. Early phase findings include blockage, staining, window defect. Mid/Late phase findings include blockage, staining, window defect (Large window defect corresponding to previous area of sub-retinal hemorrhage).   Notes *Images captured and stored on drive;   Impression: OD: normal study OS: large window defect corresponding to previous area of sub-retinal hemorrhage                  ASSESSMENT/PLAN:    ICD-10-CM   1. 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 Retinal edema  H35.81 OCT, Retina - OU - Both Eyes  4. Valsalva retinopathy  H35.00   5. Essential hypertension  I10   6. Hypertensive retinopathy of both eyes  H35.033 Fluorescein Angiography Optos (Transit OS)  7. Pseudophakia of both eyes  Z96.1   8. Exudative age-related macular degeneration of left eye with active choroidal neovascularization ([place])  H35.3221     1-4. Retinal macroaneurysm with large sub-macular hemorrhage OS-   - onset of decreased vision associated with  violent vomiting/wretching  - on xarelto for history of DVT -- pt stopped temporarily  - exam shows improvement / evolution of large submacular hemorrhage  - retinal macroaneurysm visible on exam and prior FA -- improved today  - differential also includes exudative ARMD -- see #8 below  - systemic work-up for hypertension and systemic vascular disease reviewed by Dr. Yong Channel -- risk factors optimized  - S/P IVA OS #1 (08.02.19), #2 (08.30.19), #3 (09.27.19), #4 (10.25.19), #5 (11.22.19), #6 (12.20.19), #7 (01.17.20), #8 (2.28.2020)  - interval resolution of subretinal heme -- areas of cleared subretinal heme now atrophic and scarred  - VA CF OS -- stable since last visit -- pinhole to 20/80 OS today  - OCT shows significant outer retinal atrophy corresponding to area of previous subretinal hemorrhage--involving fovea -- likely cause of severe vision loss  - discussed findings and guarded prognosis and treatment options  - recommend holding injection again today -- pt  in agreement  - F/U 4-6 months -- DFE/OCT  5,6. Hypertensive retinopathy OU  - discussed importance of tight BP control  - monitor  7. Pseudophakia OU  - s/p CE/IOL  - beautiful surgery, doing well  - monitor  8. Exudative age related macular degeneration, both eyes.    - s/p IVA OS #1 8.2.19, #2 (08.30.19), #3 (09.27.19), #4 (10.25.19), #5 (11.22.19), #6 (12.20.19), #7 (01.17.20) #8 (2.28.2020)  - f/u 4-6 months   Ophthalmic Meds Ordered this visit:  No orders of the defined types were placed in this encounter.      Return for f/u 4-6 months, sub-macular hemorrhage OS, DFE, OCT.  There are no Patient Instructions on file for this visit.   Explained the diagnoses, plan, and follow up with the patient and they expressed understanding.  Patient expressed understanding of the importance of proper follow up care.   This document serves as a record of services personally performed by Gardiner Sleeper, MD, PhD. It was  created on their behalf by Roselee Nova, COMT. The creation of this record is the provider's dictation and/or activities during the visit.  Electronically signed by: Roselee Nova, COMT 11/02/18 11:28 AM   Gardiner Sleeper, M.D., Ph.D. Diseases & Surgery of the Retina and Vitreous [facility]  I have reviewed the above documentation for accuracy and completeness, and I agree with the above. Gardiner Sleeper, M.D., Ph.D. 11/02/18 11:29 AM    Abbreviations: M myopia (nearsighted); A astigmatism; H hyperopia (farsighted); P presbyopia; Mrx spectacle prescription;  CTL contact lenses; OD right eye; OS left eye; OU both eyes  XT exotropia; ET esotropia; PEK punctate epithelial keratitis; PEE punctate epithelial erosions; DES dry eye syndrome; MGD meibomian gland dysfunction; ATs artificial tears; PFAT's preservative free artificial tears; [place] nuclear sclerotic cataract; PSC posterior subcapsular cataract; ERM epi-retinal membrane; PVD posterior vitreous detachment; RD retinal detachment; DM diabetes mellitus; DR diabetic retinopathy; NPDR non-proliferative diabetic retinopathy; PDR proliferative diabetic retinopathy; CSME clinically significant macular edema; DME diabetic macular edema; dbh dot blot hemorrhages; CWS cotton wool spot; POAG primary open angle glaucoma; C/D cup-to-disc ratio; HVF humphrey visual field; GVF goldmann visual field; OCT optical coherence tomography; IOP intraocular pressure; BRVO Branch retinal vein occlusion; CRVO central retinal vein occlusion; CRAO central retinal artery occlusion; BRAO branch retinal artery occlusion; RT retinal tear; SB scleral buckle; PPV pars plana vitrectomy; VH Vitreous hemorrhage; PRP panretinal laser photocoagulation; IVK intravitreal kenalog; VMT vitreomacular traction; MH Macular hole;  NVD neovascularization of the disc; NVE neovascularization elsewhere; AREDS age related eye disease study; ARMD age related macular degeneration;  POAG primary open angle glaucoma; EBMD epithelial/anterior basement membrane dystrophy; ACIOL anterior chamber intraocular lens; IOL intraocular lens; PCIOL posterior chamber intraocular lens; Phaco/IOL phacoemulsification with intraocular lens placement; [place] photorefractive keratectomy; LASIK laser assisted in situ keratomileusis; HTN hypertension; DM diabetes mellitus; COPD chronic obstructive pulmonary disease

## 2018-11-02 ENCOUNTER — Encounter (INDEPENDENT_AMBULATORY_CARE_PROVIDER_SITE_OTHER): Payer: Self-pay | Admitting: Ophthalmology

## 2018-11-02 ENCOUNTER — Ambulatory Visit (INDEPENDENT_AMBULATORY_CARE_PROVIDER_SITE_OTHER): Payer: Medicare Other | Admitting: Ophthalmology

## 2018-11-02 ENCOUNTER — Other Ambulatory Visit: Payer: Self-pay

## 2018-11-02 DIAGNOSIS — H3581 Retinal edema: Secondary | ICD-10-CM

## 2018-11-02 DIAGNOSIS — H35033 Hypertensive retinopathy, bilateral: Secondary | ICD-10-CM

## 2018-11-02 DIAGNOSIS — H3500 Unspecified background retinopathy: Secondary | ICD-10-CM

## 2018-11-02 DIAGNOSIS — H35012 Changes in retinal vascular appearance, left eye: Secondary | ICD-10-CM | POA: Diagnosis not present

## 2018-11-02 DIAGNOSIS — H3562 Retinal hemorrhage, left eye: Secondary | ICD-10-CM | POA: Diagnosis not present

## 2018-11-02 DIAGNOSIS — H353221 Exudative age-related macular degeneration, left eye, with active choroidal neovascularization: Secondary | ICD-10-CM | POA: Diagnosis not present

## 2018-11-02 DIAGNOSIS — I10 Essential (primary) hypertension: Secondary | ICD-10-CM | POA: Diagnosis not present

## 2018-11-02 DIAGNOSIS — Z961 Presence of intraocular lens: Secondary | ICD-10-CM | POA: Diagnosis not present

## 2018-11-16 ENCOUNTER — Encounter: Payer: Self-pay | Admitting: Internal Medicine

## 2018-11-25 ENCOUNTER — Other Ambulatory Visit: Payer: Self-pay | Admitting: Family Medicine

## 2018-11-26 ENCOUNTER — Inpatient Hospital Stay: Payer: Medicare Other | Attending: Adult Health

## 2018-11-26 ENCOUNTER — Inpatient Hospital Stay (HOSPITAL_BASED_OUTPATIENT_CLINIC_OR_DEPARTMENT_OTHER): Payer: Medicare Other | Admitting: Adult Health

## 2018-11-26 ENCOUNTER — Inpatient Hospital Stay: Payer: Medicare Other

## 2018-11-26 ENCOUNTER — Other Ambulatory Visit: Payer: Self-pay

## 2018-11-26 VITALS — BP 137/57 | HR 71 | Temp 97.6°F | Resp 18 | Ht 67.5 in | Wt 141.4 lb

## 2018-11-26 DIAGNOSIS — Z23 Encounter for immunization: Secondary | ICD-10-CM | POA: Diagnosis not present

## 2018-11-26 DIAGNOSIS — Z853 Personal history of malignant neoplasm of breast: Secondary | ICD-10-CM | POA: Insufficient documentation

## 2018-11-26 LAB — COMPREHENSIVE METABOLIC PANEL
ALT: 14 U/L (ref 0–44)
AST: 21 U/L (ref 15–41)
Albumin: 4.6 g/dL (ref 3.5–5.0)
Alkaline Phosphatase: 75 U/L (ref 38–126)
Anion gap: 8 (ref 5–15)
BUN: 20 mg/dL (ref 8–23)
CO2: 31 mmol/L (ref 22–32)
Calcium: 9.8 mg/dL (ref 8.9–10.3)
Chloride: 103 mmol/L (ref 98–111)
Creatinine, Ser: 1.16 mg/dL — ABNORMAL HIGH (ref 0.44–1.00)
GFR calc Af Amer: 51 mL/min — ABNORMAL LOW (ref >60)
GFR calc non Af Amer: 44 mL/min — ABNORMAL LOW (ref >60)
Glucose, Bld: 83 mg/dL (ref 70–99)
Potassium: 4.7 mmol/L (ref 3.5–5.1)
Sodium: 142 mmol/L (ref 135–145)
Total Bilirubin: 0.3 mg/dL (ref 0.3–1.2)
Total Protein: 7.8 g/dL (ref 6.5–8.1)

## 2018-11-26 LAB — CBC WITH DIFFERENTIAL/PLATELET
Abs Immature Granulocytes: 0.04 10*3/uL (ref 0.00–0.07)
Basophils Absolute: 0 10*3/uL (ref 0.0–0.1)
Basophils Relative: 1 %
Eosinophils Absolute: 0.2 10*3/uL (ref 0.0–0.5)
Eosinophils Relative: 3 %
HCT: 39 % (ref 36.0–46.0)
Hemoglobin: 12.9 g/dL (ref 12.0–15.0)
Immature Granulocytes: 1 %
Lymphocytes Relative: 26 %
Lymphs Abs: 1.6 10*3/uL (ref 0.7–4.0)
MCH: 30.4 pg (ref 26.0–34.0)
MCHC: 33.1 g/dL (ref 30.0–36.0)
MCV: 91.8 fL (ref 80.0–100.0)
Monocytes Absolute: 0.6 10*3/uL (ref 0.1–1.0)
Monocytes Relative: 10 %
Neutro Abs: 3.5 10*3/uL (ref 1.7–7.7)
Neutrophils Relative %: 59 %
Platelets: 228 10*3/uL (ref 150–400)
RBC: 4.25 MIL/uL (ref 3.87–5.11)
RDW: 13.6 % (ref 11.5–15.5)
WBC: 5.9 10*3/uL (ref 4.0–10.5)
nRBC: 0 % (ref 0.0–0.2)

## 2018-11-26 MED ORDER — INFLUENZA VAC A&B SA ADJ QUAD 0.5 ML IM PRSY
PREFILLED_SYRINGE | INTRAMUSCULAR | Status: AC
  Filled 2018-11-26: qty 0.5

## 2018-11-26 MED ORDER — INFLUENZA VAC A&B SA ADJ QUAD 0.5 ML IM PRSY
0.5000 mL | PREFILLED_SYRINGE | Freq: Once | INTRAMUSCULAR | Status: AC
  Administered 2018-11-26: 0.5 mL via INTRAMUSCULAR

## 2018-11-26 NOTE — Patient Instructions (Signed)
Denosumab injection What is this medicine? DENOSUMAB (den oh sue mab) slows bone breakdown. Prolia is used to treat osteoporosis in women after menopause and in men, and in people who are taking corticosteroids for 6 months or more. Xgeva is used to treat a high calcium level due to cancer and to prevent bone fractures and other bone problems caused by multiple myeloma or cancer bone metastases. Xgeva is also used to treat giant cell tumor of the bone. This medicine may be used for other purposes; ask your health care provider or pharmacist if you have questions. COMMON BRAND NAME(S): Prolia, XGEVA What should I tell my health care provider before I take this medicine? They need to know if you have any of these conditions:  dental disease  having surgery or tooth extraction  infection  kidney disease  low levels of calcium or Vitamin D in the blood  malnutrition  on hemodialysis  skin conditions or sensitivity  thyroid or parathyroid disease  an unusual reaction to denosumab, other medicines, foods, dyes, or preservatives  pregnant or trying to get pregnant  breast-feeding How should I use this medicine? This medicine is for injection under the skin. It is given by a health care professional in a hospital or clinic setting. A special MedGuide will be given to you before each treatment. Be sure to read this information carefully each time. For Prolia, talk to your pediatrician regarding the use of this medicine in children. Special care may be needed. For Xgeva, talk to your pediatrician regarding the use of this medicine in children. While this drug may be prescribed for children as young as 13 years for selected conditions, precautions do apply. Overdosage: If you think you have taken too much of this medicine contact a poison control center or emergency room at once. NOTE: This medicine is only for you. Do not share this medicine with others. What if I miss a dose? It is  important not to miss your dose. Call your doctor or health care professional if you are unable to keep an appointment. What may interact with this medicine? Do not take this medicine with any of the following medications:  other medicines containing denosumab This medicine may also interact with the following medications:  medicines that lower your chance of fighting infection  steroid medicines like prednisone or cortisone This list may not describe all possible interactions. Give your health care provider a list of all the medicines, herbs, non-prescription drugs, or dietary supplements you use. Also tell them if you smoke, drink alcohol, or use illegal drugs. Some items may interact with your medicine. What should I watch for while using this medicine? Visit your doctor or health care professional for regular checks on your progress. Your doctor or health care professional may order blood tests and other tests to see how you are doing. Call your doctor or health care professional for advice if you get a fever, chills or sore throat, or other symptoms of a cold or flu. Do not treat yourself. This drug may decrease your body's ability to fight infection. Try to avoid being around people who are sick. You should make sure you get enough calcium and vitamin D while you are taking this medicine, unless your doctor tells you not to. Discuss the foods you eat and the vitamins you take with your health care professional. See your dentist regularly. Brush and floss your teeth as directed. Before you have any dental work done, tell your dentist you are   receiving this medicine. Do not become pregnant while taking this medicine or for 5 months after stopping it. Talk with your doctor or health care professional about your birth control options while taking this medicine. Women should inform their doctor if they wish to become pregnant or think they might be pregnant. There is a potential for serious side  effects to an unborn child. Talk to your health care professional or pharmacist for more information. What side effects may I notice from receiving this medicine? Side effects that you should report to your doctor or health care professional as soon as possible:  allergic reactions like skin rash, itching or hives, swelling of the face, lips, or tongue  bone pain  breathing problems  dizziness  jaw pain, especially after dental work  redness, blistering, peeling of the skin  signs and symptoms of infection like fever or chills; cough; sore throat; pain or trouble passing urine  signs of low calcium like fast heartbeat, muscle cramps or muscle pain; pain, tingling, numbness in the hands or feet; seizures  unusual bleeding or bruising  unusually weak or tired Side effects that usually do not require medical attention (report to your doctor or health care professional if they continue or are bothersome):  constipation  diarrhea  headache  joint pain  loss of appetite  muscle pain  runny nose  tiredness  upset stomach This list may not describe all possible side effects. Call your doctor for medical advice about side effects. You may report side effects to FDA at 1-800-FDA-1088. Where should I keep my medicine? This medicine is only given in a clinic, doctor's office, or other health care setting and will not be stored at home. NOTE: This sheet is a summary. It may not cover all possible information. If you have questions about this medicine, talk to your doctor, pharmacist, or health care provider.  2020 Elsevier/Gold Standard (2017-07-04 16:10:44) Bone Health Bones protect organs, store calcium, anchor muscles, and support the whole body. Keeping your bones strong is important, especially as you get older. You can take actions to help keep your bones strong and healthy. Why is keeping my bones healthy important?  Keeping your bones healthy is important because your  body constantly replaces bone cells. Cells get old, and new cells take their place. As we age, we lose bone cells because the body may not be able to make enough new cells to replace the old cells. The amount of bone cells and bone tissue you have is referred to as bone mass. The higher your bone mass, the stronger your bones. The aging process leads to an overall loss of bone mass in the body, which can increase the likelihood of:  Joint pain and stiffness.  Broken bones.  A condition in which the bones become weak and brittle (osteoporosis). A large decline in bone mass occurs in older adults. In women, it occurs about the time of menopause. What actions can I take to keep my bones healthy? Good health habits are important for maintaining healthy bones. This includes eating nutritious foods and exercising regularly. To have healthy bones, you need to get enough of the right minerals and vitamins. Most nutrition experts recommend getting these nutrients from the foods that you eat. In some cases, taking supplements may also be recommended. Doing certain types of exercise is also important for bone health. What are the nutritional recommendations for healthy bones?  Eating a well-balanced diet with plenty of calcium and vitamin D will  help to protect your bones. Nutritional recommendations vary from person to person. Ask your health care provider what is healthy for you. Here are some general guidelines. Get enough calcium Calcium is the most important (essential) mineral for bone health. Most people can get enough calcium from their diet, but supplements may be recommended for people who are at risk for osteoporosis. Good sources of calcium include:  Dairy products, such as low-fat or nonfat milk, cheese, and yogurt.  Dark green leafy vegetables, such as bok choy and broccoli.  Calcium-fortified foods, such as orange juice, cereal, bread, soy beverages, and tofu products.  Nuts, such as  almonds. Follow these recommended amounts for daily calcium intake:  Children, age 93-3: 700 mg.  Children, age 44-8: 1,000 mg.  Children, age 446-13: 1,300 mg.  Teens, age 39-18: 1,300 mg.  Adults, age 33-50: 1,000 mg.  Adults, age 21-70: ? Men: 1,000 mg. ? Women: 1,200 mg.  Adults, age 92 or older: 1,200 mg.  Pregnant and breastfeeding females: ? Teens: 1,300 mg. ? Adults: 1,000 mg. Get enough vitamin D Vitamin D is the most essential vitamin for bone health. It helps the body absorb calcium. Sunlight stimulates the skin to make vitamin D, so be sure to get enough sunlight. If you live in a cold climate or you do not get outside often, your health care provider may recommend that you take vitamin D supplements. Good sources of vitamin D in your diet include:  Egg yolks.  Saltwater fish.  Milk and cereal fortified with vitamin D. Follow these recommended amounts for daily vitamin D intake:  Children and teens, age 93-18: 600 international units.  Adults, age 33 or younger: 400-800 international units.  Adults, age 67 or older: 800-1,000 international units. Get other important nutrients Other nutrients that are important for bone health include:  Phosphorus. This mineral is found in meat, poultry, dairy foods, nuts, and legumes. The recommended daily intake for adult men and adult women is 700 mg.  Magnesium. This mineral is found in seeds, nuts, dark green vegetables, and legumes. The recommended daily intake for adult men is 400-420 mg. For adult women, it is 310-320 mg.  Vitamin K. This vitamin is found in green leafy vegetables. The recommended daily intake is 120 mg for adult men and 90 mg for adult women. What type of physical activity is best for building and maintaining healthy bones? Weight-bearing and strength-building activities are important for building and maintaining healthy bones. Weight-bearing activities cause muscles and bones to work against gravity.  Strength-building activities increase the strength of the muscles that support bones. Weight-bearing and muscle-building activities include:  Walking and hiking.  Jogging and running.  Dancing.  Gym exercises.  Lifting weights.  Tennis and racquetball.  Climbing stairs.  Aerobics. Adults should get at least 30 minutes of moderate physical activity on most days. Children should get at least 60 minutes of moderate physical activity on most days. Ask your health care provider what type of exercise is best for you. How can I find out if my bone mass is low? Bone mass can be measured with an X-ray test called a bone mineral density (BMD) test. This test is recommended for all women who are age 934 or older. It may also be recommended for:  Men who are age 93 or older.  People who are at risk for osteoporosis because of: ? Having bones that break easily. ? Having a long-term disease that weakens bones, such as kidney disease or  rheumatoid arthritis. ? Having menopause earlier than normal. ? Taking medicine that weakens bones, such as steroids, thyroid hormones, or hormone treatment for breast cancer or prostate cancer. ? Smoking. ? Drinking three or more alcoholic drinks a day. If you find that you have a low bone mass, you may be able to prevent osteoporosis or further bone loss by changing your diet and lifestyle. Where can I find more information? For more information, check out the following websites:  [place]: [employer]fr  [employer]: www.bones.SouthExposed.es  International Osteoporosis Foundation: Administrator.iofbonehealth.org Summary  The aging process leads to an overall loss of bone mass in the body, which can increase the likelihood of broken bones and osteoporosis.  Eating a well-balanced diet with plenty of calcium and vitamin D will help to protect your bones.  Weight-bearing and strength-building activities are also important  for building and maintaining strong bones.  Bone mass can be measured with an X-ray test called a bone mineral density (BMD) test. This information is not intended to replace advice given to you by your health care provider. Make sure you discuss any questions you have with your health care provider. Document Released: 05/18/2003 Document Revised: 03/24/2017 Document Reviewed: 03/24/2017 Elsevier Patient Education  2020 [employer].

## 2018-11-26 NOTE — Patient Instructions (Signed)
Influenza Virus Vaccine injection What is this medicine? INFLUENZA VIRUS VACCINE (in floo EN zuh VAHY ruhs vak SEEN) helps to reduce the risk of getting influenza also known as the flu. The vaccine only helps protect you against some strains of the flu. This medicine may be used for other purposes; ask your health care provider or pharmacist if you have questions. COMMON BRAND NAME(S): Afluria, Afluria Quadrivalent, Agriflu, Alfuria, FLUAD, Fluarix, Fluarix Quadrivalent, Flublok, Flublok Quadrivalent, FLUCELVAX, Flulaval, Fluvirin, Fluzone, Fluzone High-Dose, Fluzone Intradermal What should I tell my health care provider before I take this medicine? They need to know if you have any of these conditions:  bleeding disorder like hemophilia  fever or infection  Guillain-Barre syndrome or other neurological problems  immune system problems  infection with the human immunodeficiency virus (HIV) or AIDS  low blood platelet counts  multiple sclerosis  an unusual or allergic reaction to influenza virus vaccine, latex, other medicines, foods, dyes, or preservatives. Different brands of vaccines contain different allergens. Some may contain latex or eggs. Talk to your doctor about your allergies to make sure that you get the right vaccine.  pregnant or trying to get pregnant  breast-feeding How should I use this medicine? This vaccine is for injection into a muscle or under the skin. It is given by a health care professional. A copy of Vaccine Information Statements will be given before each vaccination. Read this sheet carefully each time. The sheet may change frequently. Talk to your healthcare provider to see which vaccines are right for you. Some vaccines should not be used in all age groups. Overdosage: If you think you have taken too much of this medicine contact a poison control center or emergency room at once. NOTE: This medicine is only for you. Do not share this medicine with  others. What if I miss a dose? This does not apply. What may interact with this medicine?  chemotherapy or radiation therapy  medicines that lower your immune system like etanercept, anakinra, infliximab, and adalimumab  medicines that treat or prevent blood clots like warfarin  phenytoin  steroid medicines like prednisone or cortisone  theophylline  vaccines This list may not describe all possible interactions. Give your health care provider a list of all the medicines, herbs, non-prescription drugs, or dietary supplements you use. Also tell them if you smoke, drink alcohol, or use illegal drugs. Some items may interact with your medicine. What should I watch for while using this medicine? Report any side effects that do not go away within 3 days to your doctor or health care professional. Call your health care provider if any unusual symptoms occur within 6 weeks of receiving this vaccine. You may still catch the flu, but the illness is not usually as bad. You cannot get the flu from the vaccine. The vaccine will not protect against colds or other illnesses that may cause fever. The vaccine is needed every year. What side effects may I notice from receiving this medicine? Side effects that you should report to your doctor or health care professional as soon as possible:  allergic reactions like skin rash, itching or hives, swelling of the face, lips, or tongue Side effects that usually do not require medical attention (report to your doctor or health care professional if they continue or are bothersome):  fever  headache  muscle aches and pains  pain, tenderness, redness, or swelling at the injection site  tiredness This list may not describe all possible side effects. Call your   doctor for medical advice about side effects. You may report side effects to FDA at 1-800-FDA-1088. Where should I keep my medicine? The vaccine will be given by a health care professional in a  clinic, pharmacy, doctor's office, or other health care setting. You will not be given vaccine doses to store at home. NOTE: This sheet is a summary. It may not cover all possible information. If you have questions about this medicine, talk to your doctor, pharmacist, or health care provider.  2020 Elsevier/Gold Standard (2018-01-20 08:45:43)  

## 2018-11-26 NOTE — Progress Notes (Signed)
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 Non-of the 25 lymph nodes removed from the right axilla were involved. The tumor was estrogen and progesterone receptor positive, HER-2 not amplified. She was treated adjuvantly with doxorubicin and cyclophosphamide 4, then received 5 years of antiestrogen therapy (initially tamoxifen, later letrozole). There has been no evidence of disease recurrence on the right.  More recently, Cambell had routine left screening mamm[facility] 2014. This showed a suspicious area of architectural distortion in the inferior portion of the left breast. On 46/27/0350 Gretchen underwent left diagnostic mammography with ultrasonography. Spot compression views confirmed the presence 7 area of distortion in the lower central left breast, which was not palpable by physical exam (there was a prior biopsy scar in the upper outer quadrant of the left breast). Ultrasound confirmed a hypoechoic spiculated mass at the 6:00 location in the left breast, measuring maximally 1.3 cm. Sonography of the left axilla was negative.   Biopsy of the left breast mass in question 120 10/29/2014 showed ([id number]) an invasive breast cancer with squamous features. It was estrogen and progesterone receptor negative. There was no HER-2 amplification, the signals ratio being 0.82 and the number per cell 1.80. The MIB-1 was 10%.  The patient has met with Dr. Donne Hazel in surgery and Dr. Pablo Ledger in radiation oncology. The patient understands there is no survival difference between lumpectomy and radiation compared with mastectomy. The patient will need sentinel lymph node sampling with either of those procedures. If she does have a lumpectomy, she will benefit from adjuvant radiation. The patient has been set up for genetics testing and this may affect her ultimate surgical decision.  Her subsequent history is as detailed below   INTERVAL HISTORY: Laura Li was contacted today for follow-up and treatment of her triple negative early-stage breast cancer.   She continues under observation.  She has not noted any suspicious changes in her breasts.  She underwent a bone density screening on 06/30/2017, showing a T-score of -2.5, which is considered osteoporotic.  She is here today to start Prolia.  She also underwent a digital diagnostic unilateral left mammogram with tomography on 04/20/2018 showing: Breast Density Category C. There is no mammographic evidence of malignancy in the left breast.    REVIEW OF SYSTEMS: Henri wants to talk further about prolia.  She isn't sure if she wants to receive it.  She is interested in receiving a flu shot.  She notes she is doing well otherwise, in regards to staying up to date with her PCP visits, healthy diet, exercise.  She has no breast changes.  She is without any fever, chills, chest pain, palpitations, cough, shortness of breath, bowel/bladder changes, nausea, or vomiting.  A detailed ROS was otherwise  non contributory.    PAST MEDICAL HISTORY: Past Medical History:  Diagnosis Date  . Anemia     from medicaions  . Arthritis   . Chronic kidney disease    some renal impairment from meds  . Depression    states no depression  . Eczema   . Esophageal stricture   . GERD (gastroesophageal reflux disease)   . Headache   . Heart murmur    states it is benign told 30-40 years ago  . History of DVT (deep vein thrombosis)   . HX: breast cancer    melanoma  . Hyperlipidemia   . Hypertension   . Hypothyroidism   . Melanoma ([place])   . OA (osteoarthritis)   . Personal history of chemotherapy 2016   Left Breast Cancer  . Personal history of radiation therapy 2016   Left Breast Cancer  . Pneumothorax on right 06/15/2014  . PONV (postoperative nausea and vomiting)   . Wears glasses     PAST SURGICAL HISTORY: Past Surgical History:  Procedure Laterality Date  . APPENDECTOMY  1967  . BREAST EXCISIONAL BIOPSY Left   . BREAST LUMPECTOMY Left 2016  . BREAST SURGERY  1977   removal of calcified milk gland right breast  . CHEST TUBE INSERTION  06/15/2014  . CHEST TUBE INSERTION Bilateral 06/15/2014   Procedure: CHEST TUBE INSERTION;  Surgeon: Rolm Bookbinder, MD;  Location: [facility];  Service: General;  Laterality: Bilateral;  . COLONOSCOPY    . [place]   right breast  . MASTECTOMY Right 1998   right-nodes out  . MELANOMA EXCISION  2004   right side of face  . MOHS SURGERY  2005   right left-basal cell  . PORT-A-CATH REMOVAL N/A 10/05/2014   Procedure: REMOVAL PORT-A-CATH;  Surgeon: Rolm Bookbinder, MD;  Location: [facility];  Service: General;  Laterality: N/A;  . PORTACATH PLACEMENT Left 06/15/2014   Procedure: INSERTION PORT-A-CATH;  Surgeon: Rolm Bookbinder, MD;  Location: [facility];  Service: General;  Laterality: Left;  . RADIOACTIVE SEED GUIDED PARTIAL MASTECTOMY WITH AXILLARY SENTINEL LYMPH NODE BIOPSY Left 05/05/2014   Procedure: RADIOACTIVE SEED GUIDED LEFT LUMPECTOMY WITH AXILLARY SENTINEL LYMPH NODE BIOPSY;  Surgeon: Rolm Bookbinder, MD;   Location: [facility];  Service: General;  Laterality: Left;  . TONSILLECTOMY AND ADENOIDECTOMY  1949  . TOTAL ABDOMINAL HYSTERECTOMY  1992   including ovaries    FAMILY HISTORY Family History  Problem Relation Age of Onset  . Heart disease Father 4       smoker  . Heart attack Father   . Endometrial cancer Mother 22  . Breast cancer Cousin        x 3 cousins with breast cancer (one at 44, one at 1 and one at 79)  . Lung cancer Paternal Aunt        smoker  . Brain cancer Maternal Aunt   . Diabetes Maternal Aunt        ???  . Breast cancer Maternal Aunt 51  . Healthy Sister   . Tuberculosis Maternal Grandfather   . Cervical cancer Paternal Grandmother   . Healthy Daughter   . Colon cancer Neg Hx   . Amblyopia Neg Hx   . Blindness Neg Hx   . Cataracts Neg Hx   . Glaucoma Neg Hx   . Macular degeneration Neg Hx   . Retinal detachment Neg Hx   . Strabismus Neg Hx   . Retinitis pigmentosa  Neg Hx   The patient's father died at the age of 32 from a myocardial infarction. The patient's mother died at the age of 37 from metastatic endometrial cancer which had been diagnosed 2 years before. The patient had no brothers, one sister. The patient's mother's sister was diagnosed with breast cancer but the patient does not know at what age. A second maternal aunt was diagnosed with brain cancer. The patient has 3 cousins on her mother's side diagnosed with breast cancer, one of them under the age of 35. On the father's side there is a history of lung and cervical cancer  GYNECOLOGIC HISTORY:  No LMP recorded. Patient has had a hysterectomy. Menarche age 76, first live birth age 80. The patient is GX P1. She underwent hysterectomy with bilateral salpingo-oophorectomy in 1991. She used hormone replacement for approximately 7 years until 1998, when she had her right-sided breast cancer. She used oral contraceptives for approximately one year with no complications. 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 She is widowed and lives alone with no pets. Her main hobby is painting. Her daughter Faith Rogue lives in [place] and is also a homemaker. The patient has 2 grandchildren. She attends [employer]. The patient's daughter [name] is her healthcare power of attorney. [name] can be reached at [phone]   HEALTH MAINTENANCE: Social History   Tobacco Use  . Smoking status: Former Smoker    Packs/day: 0.75    Years: 3.00    Pack years: 2.25    Types: Cigarettes    Quit date: 03/11/1969    Years since quitting: 49.7  . Smokeless tobacco: Never Used  Substance Use Topics  . Alcohol use: Yes    Alcohol/week: 0.0 standard drinks    Comment: occasional wine  . Drug use: No     Colonoscopy: 20/10/[facility]  LKT:GYBWLS post hysterectomy  Bone density:2014/osteopenia  Lipid panel:  Allergies  Allergen Reactions  . Dyazide [Hydrochlorothiazide W-Triamterene]     Presumption: drug induced vasculitis  . Niacin Other (See Comments)    flushing  . Penicillins Diarrhea and Nausea And Vomiting    Current Outpatient Medications  Medication Sig Dispense Refill  . acetaminophen (TYLENOL) 500 MG tablet Take 500 mg by mouth 2 (two) times daily as needed for mild pain. Reported on 07/07/2015    . atorvastatin (LIPITOR) 20 MG tablet TAKE 1 TABLET(20 MG) BY MOUTH DAILY 90 tablet 0  . B Complex-C-E-Zn (B COMPLEX-C-E-ZINC) tablet Take 1 tablet by mouth daily.    . Calcium-Vitamin D (CALTRATE 600 PLUS-VIT D PO) Take 1 tablet by mouth 2 (two) times daily.     . carboxymethylcellulose (REFRESH PLUS) 0.5 % SOLN 2 drops 3 (three) times daily as needed.    [name] esomeprazole (NEXIUM) 20 MG capsule TAKE 1 CAPSULE BY MOUTH DAILY 90 capsule 3  . fexofenadine (ALLEGRA) 180 MG tablet Take  180 mg by mouth daily as needed for allergies.     [name] ipratropium (ATROVENT) 0.03 % nasal spray Place 2 sprays into the nose every 12 (twelve) hours. 180 mL 3  . Lactobacillus-Inulin (CULTURELLE DIGESTIVE HEALTH PO) Take 1 capsule by mouth daily.    [name] levothyroxine (SYNTHROID) 88 MCG tablet TAKE 1 TABLET(88 MCG) BY MOUTH DAILY 90 tablet 3  . Multiple Vitamins-Minerals (CENTRUM  SILVER PO) Take 1 tablet by mouth daily.    [name] MYRBETRIQ 25 MG TB24 tablet TAKE 1 TABLET BY MOUTH DAILY 90 tablet 0  . nadolol (CORGARD) 20 MG tablet TAKE 1/2 TABLET(10 MG) BY MOUTH DAILY 45 tablet 2  . Propylene Glycol (SYSTANE BALANCE) 0.6 % SOLN Apply 1-2 drops to eye daily as needed (dry eyes).     . venlafaxine XR (EFFEXOR-XR) 150 MG 24 hr capsule TAKE 1 CAPSULE(150 MG) BY MOUTH DAILY 90 capsule 3  . vitamin B-12 (CYANOCOBALAMIN) 100 MCG tablet Take 100 mcg by mouth daily.     Current Facility-Administered Medications  Medication Dose Route Frequency Provider Last Rate Last Dose  . Bevacizumab (AVASTIN) SOLN 1.25 mg  1.25 mg Intravitreal  Bernarda Caffey, MD   1.25 mg at 10/10/17 1318  . Bevacizumab (AVASTIN) SOLN 1.25 mg  1.25 mg Intravitreal  Bernarda Caffey, MD   1.25 mg at 11/07/17 1206  . Bevacizumab (AVASTIN) SOLN 1.25 mg  1.25 mg Intravitreal  Bernarda Caffey, MD   1.25 mg at 12/05/17 1212  . Bevacizumab (AVASTIN) SOLN 1.25 mg  1.25 mg Intravitreal  Bernarda Caffey, MD   1.25 mg at 01/04/18 2328  . Bevacizumab (AVASTIN) SOLN 1.25 mg  1.25 mg Intravitreal  Bernarda Caffey, MD   1.25 mg at 01/30/18 1230  . Bevacizumab (AVASTIN) SOLN 1.25 mg  1.25 mg Intravitreal  Bernarda Caffey, MD   1.25 mg at 02/27/18 1315  . Bevacizumab (AVASTIN) SOLN 1.25 mg  1.25 mg Intravitreal  Bernarda Caffey, MD   1.25 mg at 03/27/18 1138  . Bevacizumab (AVASTIN) SOLN 1.25 mg  1.25 mg Intravitreal  Bernarda Caffey, MD   1.25 mg at 05/08/18 1231    OBJECTIVE: Older white woman in no acute distress Vitals:   11/26/18 1326  BP: (!) 137/57  Pulse: 71   Resp: 18  Temp: 97.6 F (36.4 C)  SpO2: 99%     Body mass index is 21.82 kg/m.    ECOG FS:1 - Symptomatic but completely ambulatory GENERAL: Patient is a well appearing female in no acute distress HEENT:  Sclerae anicteric.  Oropharynx clear and moist. No ulcerations or evidence of oropharyngeal candidiasis. Neck is supple.  NODES:  No cervical, supraclavicular, or axillary lymphadenopathy palpated.  BREAST EXAM:  Right breast s/p mastectomy, no sign of local recurrence, left breast s/p lumpectomy, no sign of local recurrence LUNGS:  Clear to auscultation bilaterally.  No wheezes or rhonchi. HEART:  Regular rate and rhythm. No murmur appreciated. ABDOMEN:  Soft, nontender.  Positive, normoactive bowel sounds. No organomegaly palpated. MSK:  No focal spinal tenderness to palpation. Full range of motion bilaterally in the upper extremities. EXTREMITIES:  No peripheral edema.   SKIN:  Clear with no obvious rashes or skin changes. No nail dyscrasia. NEURO:  Nonfocal. Well oriented.  Appropriate affect.    LAB RESULTS:  CMP     Component Value Date/Time   NA 138 10/16/2017 1548   NA 141 06/28/2016 1118   K 4.6 10/16/2017 1548   K 4.4 06/28/2016 1118   CL 100 10/16/2017 1548   CO2 29 10/16/2017 1548   CO2 26 06/28/2016 1118   GLUCOSE 90 10/16/2017 1548   GLUCOSE 106 06/28/2016 1118   GLUCOSE 88 02/06/2006 1059   BUN 22 10/16/2017 1548   BUN 16.2 06/28/2016 1118   CREATININE 1.03 10/16/2017 1548   CREATININE 1.05 06/24/2017 1108   CREATININE 1.0 06/28/2016 1118   CALCIUM 10.0 10/16/2017 1548   CALCIUM  9.9 06/28/2016 1118   PROT 7.4 10/16/2017 1548   PROT 7.6 06/28/2016 1118   ALBUMIN 4.4 10/16/2017 1548   ALBUMIN 4.2 06/28/2016 1118   AST 18 10/16/2017 1548   AST 24 06/24/2017 1108   AST 21 06/28/2016 1118   ALT 12 10/16/2017 1548   ALT 21 06/24/2017 1108   ALT 17 06/28/2016 1118   ALKPHOS 71 10/16/2017 1548   ALKPHOS 66 06/28/2016 1118   BILITOT 0.4 10/16/2017 1548    BILITOT 0.4 06/24/2017 1108   BILITOT 0.47 06/28/2016 1118   GFRNONAA 50 (L) 06/24/2017 1108   GFRAA 57 (L) 06/24/2017 1108    INo results found for: SPEP, UPEP  Lab Results  Component Value Date   WBC 5.9 11/26/2018   NEUTROABS 3.5 11/26/2018   HGB 12.9 11/26/2018   HCT 39.0 11/26/2018   MCV 91.8 11/26/2018   PLT 228 11/26/2018      Chemistry      Component Value Date/Time   NA 138 10/16/2017 1548   NA 141 06/28/2016 1118   K 4.6 10/16/2017 1548   K 4.4 06/28/2016 1118   CL 100 10/16/2017 1548   CO2 29 10/16/2017 1548   CO2 26 06/28/2016 1118   BUN 22 10/16/2017 1548   BUN 16.2 06/28/2016 1118   CREATININE 1.03 10/16/2017 1548   CREATININE 1.05 06/24/2017 1108   CREATININE 1.0 06/28/2016 1118      Component Value Date/Time   CALCIUM 10.0 10/16/2017 1548   CALCIUM 9.9 06/28/2016 1118   ALKPHOS 71 10/16/2017 1548   ALKPHOS 66 06/28/2016 1118   AST 18 10/16/2017 1548   AST 24 06/24/2017 1108   AST 21 06/28/2016 1118   ALT 12 10/16/2017 1548   ALT 21 06/24/2017 1108   ALT 17 06/28/2016 1118   BILITOT 0.4 10/16/2017 1548   BILITOT 0.4 06/24/2017 1108   BILITOT 0.47 06/28/2016 1118       No results found for: LABCA2  No components found for: LABCA125  No results for input(s): INR in the last 168 hours.  Urinalysis    Component Value Date/Time   COLORURINE YELLOW 05/28/2016 2212   APPEARANCEUR CLEAR 05/28/2016 2212   LABSPEC 1.010 01/31/2018 1420   LABSPEC 1.005 08/09/2014 1052   PHURINE 6.0 01/31/2018 1420   GLUCOSEU NEGATIVE 01/31/2018 1420   GLUCOSEU Negative 08/09/2014 1052   HGBUR LARGE (A) 01/31/2018 1420   BILIRUBINUR NEGATIVE 01/31/2018 1420   BILIRUBINUR Negative 11/04/2017 1612   BILIRUBINUR Negative 08/09/2014 [place] 01/31/2018 1420   PROTEINUR NEGATIVE 01/31/2018 1420   UROBILINOGEN 0.2 01/31/2018 1420   UROBILINOGEN 0.2 08/09/2014 1052   NITRITE NEGATIVE 01/31/2018 1420   LEUKOCYTESUR SMALL (A) 01/31/2018 1420    LEUKOCYTESUR Negative 08/09/2014 1052    STUDIES: Mammography and bone density results discussed with the patient  ASSESSMENT: 78 y.o. [place] woman  (1) status post right modified radical mastectomy November 1998 for a pT2 pN0, stage IIA invasive ductal carcinoma, grade 2, estrogen and progesterone receptor positive, HER-2 not amplified  (a) adjuvant chemotherapy consisted of doxorubicin and cyclophosphamide 4  (b) adjuvant anti-estrogens consisted of tamoxifen, then letrozole, for a total of 5 years  (2) status post left breast lower outer quadrant biopsy 04/07/2014 for a clinical T1c N0,stage IA  invasive ductal carcinoma, with squamous features, estrogen and progesterone receptor negative, HER-2 not amplified, with an MIB-1 of 10%   (3) left lumpectomy and sentinel lymph node biopsy 05/05/2014 showed aT1c pN0, stage IA invasive  ductal carcinoma, grade 1, triple negative, with close but negative margins.   (4) Mammaprint classifies the tumor as basal like, high risk, and predicts a 30% chance of recurrence within 10 years with local treatment only.   (5) start of treatment delayed because of bilateral pneumothoraces after port placement. Adjuvant chemotherapy fiinally started 07/11/2014 consisting of carboplatin and docetaxel given every 21 days 4, completed 09/13/2014  (6) adjuvant radiation 10/31/2014-12/15/2014:  Left breast/ 45 Gy at 1.8 Gy per fraction x 25 fractions.  Left breast boost/ 16 Gy at 2 Gy per fraction x 8 fractions  (7) genetics testing March 2016 showed no deleterious mutation in the [employer] panel  [[facility]] including sequencing and rearrangement analysis for the following 24 genes:ATM, BARD1, BRCA1, BRCA2, BRIP1, CDH1, CHEK2, EPCAM, MLH1, MRE11A, MSH2, MSH6, MUTYH, NBN, NF1, PALB2, PMS2, PTEN, RAD50, RAD51C, RAD51D, SMARCA4, STK11, and TP53.   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    (8) osteopenia by bone density 06/29/2015 with [place] T score of -2.4             (a) intolerant of Fosamax (which she did take for 5 years however).  (b) repeat bone density 06/28/2017 shows a T score of -2.5 (osteoporosis)  (c) she is interested in considering Prolia.    PLAN: Zsazsa  is doing well today.  She is clinically and radiographically without any evidence of breast cancer recurrence.  This is good news.    We reviewed her bone density and the possiblitiy of her receiving prolia.  I reviewed the risks and benefits.  We also reviewed bone health in detail and I gave her a printout on both the Prolia and bone health.  She would like to take some time and think about the prolia.  She would like a flu shot today.    I recommended she stay up to date with cancer screening, and to continue healthy diet and exercise.    We will see her back in 1 year for follow up.  She knows to contact me if she would like to get set up here for prolia.  We are happy to arrange this for her.    She was recommended to continue with the appropriate pandemic precautions. She knows to call for any questions that may arise between now and her next appointment.  We are happy to see her sooner if needed.  A total of (30) minutes of face-to-face time was spent with this patient with greater than 50% of that time in counseling and care-coordination.   Wilber Bihari, NP   11/26/2018 1:33 PM

## 2018-11-27 ENCOUNTER — Telehealth: Payer: Self-pay | Admitting: Oncology

## 2018-11-27 NOTE — Telephone Encounter (Signed)
I talk with patient regarding schedule  

## 2018-12-01 ENCOUNTER — Encounter: Payer: Self-pay | Admitting: Adult Health

## 2018-12-15 ENCOUNTER — Ambulatory Visit (INDEPENDENT_AMBULATORY_CARE_PROVIDER_SITE_OTHER): Payer: Medicare Other

## 2018-12-15 ENCOUNTER — Other Ambulatory Visit: Payer: Self-pay

## 2018-12-15 VITALS — BP 124/68 | Temp 98.0°F | Ht 68.0 in | Wt 142.0 lb

## 2018-12-15 DIAGNOSIS — Z0000 Encounter for general adult medical examination without abnormal findings: Secondary | ICD-10-CM | POA: Diagnosis not present

## 2018-12-15 NOTE — Patient Instructions (Addendum)
Laura Li , Thank you for taking time to come for your Medicare Wellness Visit. I appreciate your ongoing commitment to your health goals. Please review the following plan we discussed and let me know if I can assist you in the future.   Screening recommendations/referrals: Colorectal Screening: not indicated; last 2010 Mammogram: up to date; last 04/20/18 Bone Density: up to date; last 06/30/17  Vision and Dental Exams: Recommended annual ophthalmology exams for early detection of glaucoma and other disorders of the eye Recommended annual dental exams for proper oral hygiene  Vaccinations: Influenza vaccine: completed 11/26/18 Pneumococcal vaccine: up to date; last 10/18/16 Tdap vaccine: up to date; last 03/19/10  Shingles vaccine: Please call your insurance company to determine your out of pocket expense for the Shingrix vaccine. You may receive this vaccine at your local pharmacy.  Advanced directives: Please bring a copy of your POA (Power of Attorney) and/or Living Will to your next appointment.  Goals: Recommend to drink at least 6-8 8oz glasses of water per day and continue to be careful of falls.     Next appointment: Please schedule your Annual Wellness Visit with your Nurse Health Advisor in one year.  Schedule a follow up visit with Dr. Yong Channel for December.   Preventive Care 11 Years and Older, Female Preventive care refers to lifestyle choices and visits with your health care provider that can promote health and wellness. What does preventive care include?  A yearly physical exam. This is also called an annual well check.  Dental exams once or twice a year.  Routine eye exams. Ask your health care provider how often you should have your eyes checked.  Personal lifestyle choices, including:  Daily care of your teeth and gums.  Regular physical activity.  Eating a healthy diet.  Avoiding tobacco and drug use.  Limiting alcohol use.  Practicing safe sex.   Taking low-dose aspirin every day if recommended by your health care provider.  Taking vitamin and mineral supplements as recommended by your health care provider. What happens during an annual well check? The services and screenings done by your health care provider during your annual well check will depend on your age, overall health, lifestyle risk factors, and family history of disease. Counseling  Your health care provider may ask you questions about your:  Alcohol use.  Tobacco use.  Drug use.  Emotional well-being.  Home and relationship well-being.  Sexual activity.  Eating habits.  History of falls.  Memory and ability to understand (cognition).  Work and work Statistician.  Reproductive health. Screening  You may have the following tests or measurements:  Height, weight, and BMI.  Blood pressure.  Lipid and cholesterol levels. These may be checked every 5 years, or more frequently if you are over 64 years old.  Skin check.  Lung cancer screening. You may have this screening every year starting at age 70 if you have a 30-pack-year history of smoking and currently smoke or have quit within the past 15 years.  Fecal occult blood test (FOBT) of the stool. You may have this test every year starting at age 43.  Flexible sigmoidoscopy or colonoscopy. You may have a sigmoidoscopy every 5 years or a colonoscopy every 10 years starting at age 33.  Hepatitis C blood test.  Hepatitis B blood test.  Sexually transmitted disease (STD) testing.  Diabetes screening. This is done by checking your blood sugar (glucose) after you have not eaten for a while (fasting). You may have this  done every 1-3 years.  Bone density scan. This is done to screen for osteoporosis. You may have this done starting at age 38.  Mammogram. This may be done every 1-2 years. Talk to your health care provider about how often you should have regular mammograms. Talk with your health care  provider about your test results, treatment options, and if necessary, the need for more tests. Vaccines  Your health care provider may recommend certain vaccines, such as:  Influenza vaccine. This is recommended every year.  Tetanus, diphtheria, and acellular pertussis (Tdap, Td) vaccine. You may need a Td booster every 10 years.  Zoster vaccine. You may need this after age 28.  Pneumococcal 13-valent conjugate (PCV13) vaccine. One dose is recommended after age 27.  Pneumococcal polysaccharide (PPSV23) vaccine. One dose is recommended after age 55. Talk to your health care provider about which screenings and vaccines you need and how often you need them. This information is not intended to replace advice given to you by your health care provider. Make sure you discuss any questions you have with your health care provider. Document Released: 03/24/2015 Document Revised: 11/15/2015 Document Reviewed: 12/27/2014 Elsevier Interactive Patient Education  2017 [place] Prevention in the Home Falls can cause injuries. They can happen to people of all ages. There are many things you can do to make your home safe and to help prevent falls. What can I do on the outside of my home?  Regularly fix the edges of walkways and driveways and fix any cracks.  Remove anything that might make you trip as you walk through a door, such as a raised step or threshold.  Trim any bushes or trees on the path to your home.  Use bright outdoor lighting.  Clear any walking paths of anything that might make someone trip, such as rocks or tools.  Regularly check to see if handrails are loose or broken. Make sure that both sides of any steps have handrails.  Any raised decks and porches should have guardrails on the edges.  Have any leaves, snow, or ice cleared regularly.  Use sand or salt on walking paths during winter.  Clean up any spills in your garage right away. This includes oil or grease  spills. What can I do in the bathroom?  Use night lights.  Install grab bars by the toilet and in the tub and shower. Do not use towel bars as grab bars.  Use non-skid mats or decals in the tub or shower.  If you need to sit down in the shower, use a plastic, non-slip stool.  Keep the floor dry. Clean up any water that spills on the floor as soon as it happens.  Remove soap buildup in the tub or shower regularly.  Attach bath mats securely with double-sided non-slip rug tape.  Do not have throw rugs and other things on the floor that can make you trip. What can I do in the bedroom?  Use night lights.  Make sure that you have a light by your bed that is easy to reach.  Do not use any sheets or blankets that are too big for your bed. They should not hang down onto the floor.  Have a firm chair that has side arms. You can use this for support while you get dressed.  Do not have throw rugs and other things on the floor that can make you trip. What can I do in the kitchen?  Clean up any spills  right away.  Avoid walking on wet floors.  Keep items that you use a lot in easy-to-reach places.  If you need to reach something above you, use a strong step stool that has a grab bar.  Keep electrical cords out of the way.  Do not use floor polish or wax that makes floors slippery. If you must use wax, use non-skid floor wax.  Do not have throw rugs and other things on the floor that can make you trip. What can I do with my stairs?  Do not leave any items on the stairs.  Make sure that there are handrails on both sides of the stairs and use them. Fix handrails that are broken or loose. Make sure that handrails are as long as the stairways.  Check any carpeting to make sure that it is firmly attached to the stairs. Fix any carpet that is loose or worn.  Avoid having throw rugs at the top or bottom of the stairs. If you do have throw rugs, attach them to the floor with carpet  tape.  Make sure that you have a light switch at the top of the stairs and the bottom of the stairs. If you do not have them, ask someone to add them for you. What else can I do to help prevent falls?  Wear shoes that:  Do not have high heels.  Have rubber bottoms.  Are comfortable and fit you well.  Are closed at the toe. Do not wear sandals.  If you use a stepladder:  Make sure that it is fully opened. Do not climb a closed stepladder.  Make sure that both sides of the stepladder are locked into place.  Ask someone to hold it for you, if possible.  Clearly mark and make sure that you can see:  Any grab bars or handrails.  First and last steps.  Where the edge of each step is.  Use tools that help you move around (mobility aids) if they are needed. These include:  Canes.  Walkers.  Scooters.  Crutches.  Turn on the lights when you go into a dark area. Replace any light bulbs as soon as they burn out.  Set up your furniture so you have a clear path. Avoid moving your furniture around.  If any of your floors are uneven, fix them.  If there are any pets around you, be aware of where they are.  Review your medicines with your doctor. Some medicines can make you feel dizzy. This can increase your chance of falling. Ask your doctor what other things that you can do to help prevent falls. This information is not intended to replace advice given to you by your health care provider. Make sure you discuss any questions you have with your health care provider. Document Released: 12/22/2008 Document Revised: 08/03/2015 Document Reviewed: 04/01/2014 Elsevier Interactive Patient Education  2017 [employer].

## 2018-12-15 NOTE — Progress Notes (Signed)
I have reviewed and agree with note, evaluation, plan.   Team- please write out rx  At bottom of Courtney's notes on scrpt pad and I will sign for you to fax.   [name], MD

## 2018-12-15 NOTE — Progress Notes (Signed)
Subjective:   Laura Li is a 78 y.o. female who presents for Medicare Annual (Subsequent) preventive examination.  Review of Systems:   Cardiac Risk Factors include: advanced age (>75men, >31 women);dyslipidemia     Objective:     Vitals: BP 124/68 (BP Location: Left Arm, Patient Position: Sitting, Cuff Size: Normal)   Temp 98 F (36.7 C) (Temporal)   Ht 5\' 8"  (1.727 m)   Wt 142 lb (64.4 kg)   BMI 21.59 kg/m   Body mass index is 21.59 kg/m.  Advanced Directives 12/15/2018 11/04/2017 10/18/2016 05/29/2016 05/28/2016 12/26/2015 10/06/2015  Does Patient Have a Medical Advance Directive? Yes Yes Yes Yes Yes Yes Yes  Type of Paramedic of [place];Living will [place];Living will - [place] - -  Does patient want to make changes to medical advance directive? No - Patient declined No - Patient declined - No - Patient declined - - -  Copy of [place] in Chart? No - copy requested No - copy requested - No - copy requested No - copy requested - -    Tobacco Social History   Tobacco Use  Smoking Status Former Smoker  . Packs/day: 0.75  . Years: 3.00  . Pack years: 2.25  . Types: Cigarettes  . Quit date: 03/11/1969  . Years since quitting: 49.7  Smokeless Tobacco Never Used     Counseling given: Not Answered   Clinical Intake:  Pre-visit preparation completed: Yes  Pain : No/denies pain     Diabetes: No  How often do you need to have someone help you when you read instructions, pamphlets, or other written materials from your doctor or pharmacy?: 2 - Rarely  Interpreter Needed?: No  Information entered by :: Denman George LPN  Past Medical History:  Diagnosis Date  . Anemia    from medicaions  . Arthritis   . Chronic kidney disease    some renal impairment from meds  . Depression    states no depression  . Eczema   . Esophageal  stricture   . GERD (gastroesophageal reflux disease)   . Headache   . Heart murmur    states it is benign told 30-40 years ago  . History of DVT (deep vein thrombosis)   . HX: breast cancer    melanoma  . Hyperlipidemia   . Hypertension   . Hypothyroidism   . Melanoma ([place])   . OA (osteoarthritis)   . Personal history of chemotherapy 2016   Left Breast Cancer  . Personal history of radiation therapy 2016   Left Breast Cancer  . Pneumothorax on right 06/15/2014  . PONV (postoperative nausea and vomiting)   . Wears glasses    Past Surgical History:  Procedure Laterality Date  . APPENDECTOMY  1967  . BREAST EXCISIONAL BIOPSY Left   . BREAST LUMPECTOMY Left 2016  . BREAST SURGERY  1977   removal of calcified milk gland right breast  . CHEST TUBE INSERTION  06/15/2014  . CHEST TUBE INSERTION Bilateral 06/15/2014   Procedure: CHEST TUBE INSERTION;  Surgeon: Rolm Bookbinder, MD;  Location: [facility];  Service: General;  Laterality: Bilateral;  . COLONOSCOPY    . [place]   right breast  . MASTECTOMY Right 1998   right-nodes out  . MELANOMA EXCISION  2004   right side of face  . MOHS SURGERY  2005   right left-basal  cell  . PORT-A-CATH REMOVAL N/A 10/05/2014   Procedure: REMOVAL PORT-A-CATH;  Surgeon: Rolm Bookbinder, MD;  Location: [facility];  Service: General;  Laterality: N/A;  . PORTACATH PLACEMENT Left 06/15/2014   Procedure: INSERTION PORT-A-CATH;  Surgeon: Rolm Bookbinder, MD;  Location: [facility];  Service: General;  Laterality: Left;  . RADIOACTIVE SEED GUIDED PARTIAL MASTECTOMY WITH AXILLARY SENTINEL LYMPH NODE BIOPSY Left 05/05/2014   Procedure: RADIOACTIVE SEED GUIDED LEFT LUMPECTOMY WITH AXILLARY SENTINEL LYMPH NODE BIOPSY;  Surgeon: Rolm Bookbinder, MD;  Location: [facility];  Service: General;  Laterality: Left;  . TONSILLECTOMY AND ADENOIDECTOMY  1949  . TOTAL ABDOMINAL HYSTERECTOMY  1992   including ovaries   Family  History  Problem Relation Age of Onset  . Heart disease Father 52       smoker  . Heart attack Father   . Endometrial cancer Mother 41  . Breast cancer Cousin        x 3 cousins with breast cancer (one at 94, one at 48 and one at 48)  . Lung cancer Paternal Aunt        smoker  . Brain cancer Maternal Aunt   . Diabetes Maternal Aunt        ???  . Breast cancer Maternal Aunt 51  . Healthy Sister   . Tuberculosis Maternal Grandfather   . Cervical cancer Paternal Grandmother   . Healthy Daughter   . Colon cancer Neg Hx   . Amblyopia Neg Hx   . Blindness Neg Hx   . Cataracts Neg Hx   . Glaucoma Neg Hx   . Macular degeneration Neg Hx   . Retinal detachment Neg Hx   . Strabismus Neg Hx   . Retinitis pigmentosa Neg Hx    Social History   Socioeconomic History  . Marital status: Widowed    Spouse name: Not on file  . Number of children: Not on file  . Years of education: Not on file  . Highest education level: Not on file  Occupational History  . Occupation: retired  Scientific laboratory technician  . Financial resource strain: Not on file  . Food insecurity    Worry: Not on file    Inability: Not on file  . Transportation needs    Medical: Not on file    Non-medical: Not on file  Tobacco Use  . Smoking status: Former Smoker    Packs/day: 0.75    Years: 3.00    Pack years: 2.25    Types: Cigarettes    Quit date: 03/11/1969    Years since quitting: 49.7  . Smokeless tobacco: Never Used  Substance and Sexual Activity  . Alcohol use: Yes    Alcohol/week: 0.0 standard drinks    Comment: occasional wine  . Drug use: No  . Sexual activity: Not Currently    Birth control/protection: None  Lifestyle  . Physical activity    Days per week: Not on file    Minutes per session: Not on file  . Stress: Not on file  Relationships  . Social Herbalist on phone: Not on file    Gets together: Not on file    Attends religious service: Not on file    Active member of club or  organization: Not on file    Attends meetings of clubs or organizations: Not on file    Relationship status: Not on file  Other Topics Concern  . Not on file  Social History  Narrative   Widowed 2003. Moved to [place] from [place] in 2004 after loss of husband to be near daughter. 1 daughter. 2 grandkids.       Retired from working at a church      Hobbies: artist    Outpatient Encounter Medications as of 12/15/2018  Medication Sig  . acetaminophen (TYLENOL) 500 MG tablet Take 500 mg by mouth 2 (two) times daily as needed for mild pain. Reported on 07/07/2015  . atorvastatin (LIPITOR) 20 MG tablet TAKE 1 TABLET(20 MG) BY MOUTH DAILY  . B Complex-C-E-Zn (B COMPLEX-C-E-ZINC) tablet Take 1 tablet by mouth daily.  . Calcium-Vitamin D (CALTRATE 600 PLUS-VIT D PO) Take 1 tablet by mouth 2 (two) times daily.   . carboxymethylcellulose (REFRESH PLUS) 0.5 % SOLN 2 drops 3 (three) times daily as needed.  [name] esomeprazole (NEXIUM) 20 MG capsule TAKE 1 CAPSULE BY MOUTH DAILY  . fexofenadine (ALLEGRA) 180 MG tablet Take 180 mg by mouth daily as needed for allergies.   [name] ipratropium (ATROVENT) 0.03 % nasal spray Place 2 sprays into the nose every 12 (twelve) hours.  . Lactobacillus-Inulin (CULTURELLE DIGESTIVE HEALTH PO) Take 1 capsule by mouth daily.  [name] levothyroxine (SYNTHROID) 88 MCG tablet TAKE 1 TABLET(88 MCG) BY MOUTH DAILY  . Multiple Vitamins-Minerals (CENTRUM SILVER PO) Take 1 tablet by mouth daily.  [name] MYRBETRIQ 25 MG TB24 tablet TAKE 1 TABLET BY MOUTH DAILY  . nadolol (CORGARD) 20 MG tablet TAKE 1/2 TABLET(10 MG) BY MOUTH DAILY  . Propylene Glycol (SYSTANE BALANCE) 0.6 % SOLN Apply 1-2 drops to eye daily as needed (dry eyes).   . venlafaxine XR (EFFEXOR-XR) 150 MG 24 hr capsule TAKE 1 CAPSULE(150 MG) BY MOUTH DAILY  . vitamin B-12 (CYANOCOBALAMIN) 100 MCG tablet Take 100 mcg by mouth daily.   Facility-Administered Encounter Medications as of 12/15/2018  Medication  . Bevacizumab (AVASTIN)  SOLN 1.25 mg  . Bevacizumab (AVASTIN) SOLN 1.25 mg  . Bevacizumab (AVASTIN) SOLN 1.25 mg  . Bevacizumab (AVASTIN) SOLN 1.25 mg  . Bevacizumab (AVASTIN) SOLN 1.25 mg  . Bevacizumab (AVASTIN) SOLN 1.25 mg  . Bevacizumab (AVASTIN) SOLN 1.25 mg  . Bevacizumab (AVASTIN) SOLN 1.25 mg    Activities of Daily Living In your present state of health, do you have any difficulty performing the following activities: 12/15/2018  Hearing? N  Vision? N  Difficulty concentrating or making decisions? 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 N  Dressing or bathing? N  Doing errands, shopping? Y  Comment mild cognitive impairment  Preparing Food and eating ? N  Using the Toilet? N  In the past six months, have you accidently leaked urine? Y  Comment currently taking Myrbetriq  Do you have problems with loss of bowel control? N  Managing your Medications? N  Managing your Finances? Y  Comment handled by daughter  Housekeeping or managing your Housekeeping? Y  Comment lives at [place] recent data might be hidden    Patient Care Team: Marin Olp, MD as PCP - General (Family Medicine) Rolm Bookbinder, MD as Consulting Physician (General Surgery) Thea Silversmith, MD as Consulting Physician (Radiation Oncology) Magrinat, Virgie Dad, MD as Consulting Physician (Oncology) Sylvan Cheese, NP as Nurse Practitioner (Hematology and Oncology) Bernarda Caffey, MD as Consulting Physician (Ophthalmology) Dohmeier, Asencion Partridge, MD as Consulting Physician (Neurology)    Assessment:   This is a routine wellness examination for Keylen.  Exercise Activities and Dietary recommendations Current Exercise  Habits: The patient does not participate in regular exercise at present  Goals    . Exercise 150 minutes per week (moderate activity)     Goal is to find a rhythm of exercise you enjoy and put on it on your calender Start low and slow  Y or  other with a walking trial;  Has a program at [employer] that physiology students did work with older adults     . patient     Rethinking long term care plan [employer]; [phone] Senior Directory; Information regarding Long Term Care  Visit places at different; You can call and go there for lunch'         Fall Risk Fall Risk  12/15/2018 11/04/2017 07/21/2017 10/18/2016 10/18/2016  Falls in the past year? 0 Yes Yes Yes No  Comment - - - [place] -  Number falls in past yr: - 2 or more 2 or more 1 -  Comment - tripped at a friends house - - -  Injury with Fall? 0 Yes Yes - -  Comment - - - - -  Risk for fall due to : - - - Impaired balance/gait -  Follow up Falls evaluation completed;Education provided;Falls prevention discussed - Falls prevention discussed - -   Is the patient's home free of loose throw rugs in walkways, pet beds, electrical cords, etc? Yes      Grab bars in the bathroom? yes      Handrails on the stairs?   yes      Adequate lighting?   yes  Timed Get Up and Go performed: completed and within normal timeframe; no gait abnormalities noted    Depression Screen PHQ 2/9 Scores 12/15/2018 07/16/2018 11/04/2017 10/16/2017  PHQ - 2 Score 0 0 1 0  PHQ- 9 Score - 1 5 4      Cognitive Function-followed by neurology no new concerns  MMSE - Mini Mental State Exam 09/24/2018 10/18/2016 10/06/2015  Not completed: - Refused -  Orientation to time 5 4 4   Orientation to Place 5 5 5   Registration 3 3 3   Attention/ Calculation 5 3 5   Attention/Calculation-comments - serial 3 from 20; (1 serials 7 from 100) -  Recall 2 3 2   Language- name 2 objects 2 2 2   Language- repeat 1 1 1   Language- follow 3 step command 3 2 3   Language- read & follow direction 1 1 1   Write a sentence 1 1 1   Copy design 1 1 1   Copy design-comments named 9 animals - -  Total score 29 26 28    Montreal Cognitive Assessment  07/21/2017   Visuospatial/ Executive (0/5) 5  Naming (0/3) 2  Attention: Read list of digits (0/2) 2  Attention: Read list of letters (0/1) 1  Attention: Serial 7 subtraction starting at 100 (0/3) 3  Language: Repeat phrase (0/2) 2  Language : Fluency (0/1) 1  Abstraction (0/2) 2  Delayed Recall (0/5) 3  Orientation (0/6) 5  Total 26   6CIT Screen 11/04/2017  What Year? 0 points  What month? 0 points  What time? 0 points  Count back from 20 0 points  Months in reverse 0 points    Immunization History  Administered Date(s) Administered  . Fluad Quad(high Dose 65+) 11/26/2018  . Influenza Split 02/11/2011  . Influenza Whole 12/21/2008, 01/16/2010  . Influenza, High Dose Seasonal PF 12/05/2015, 12/18/2016, 11/24/2017  . Influenza,inj,Quad PF,6+ Mos 12/21/2013, 11/22/2014  .  Influenza-Unspecified 12/09/2012  . Pneumococcal Conjugate-13 09/01/2015  . Pneumococcal Polysaccharide-23 03/11/2001, 10/18/2016  . Td 03/19/2010    Qualifies for Shingles Vaccine?Discussed and patient will check with pharmacy for coverage.  Patient education handout provided   Screening Tests Health Maintenance  Topic Date Due  . TETANUS/TDAP  03/19/2020  . INFLUENZA VACCINE  Completed  . PNA vac Low Risk Adult  Completed  . DEXA SCAN  Addressed    Cancer Screenings: Lung: Low Dose CT Chest recommended if Age 42-80 years, 30 pack-year currently smoking OR have quit w/in 15years. Patient does not qualify. Breast:  Up to date on Mammogram? Yes   Up to date of Bone Density/Dexa? Yes Colorectal: not indicated; last 2010     Plan:  I have personally reviewed and addressed the Medicare Annual Wellness questionnaire and have noted the following in the patient's chart:  A. Medical and social history B. Use of alcohol, tobacco or illicit drugs  C. Current medications and supplements D. Functional ability and status E.  Nutritional status F.  Physical activity G. Advance directives H. List of other physicians  I.  Hospitalizations, surgeries, and ER visits in previous 12 months J.  [place] such as hearing and vision if needed, cognitive and depression L. Referrals, records requested, and appointments- none   In addition, I have reviewed and discussed with patient certain preventive protocols, quality metrics, and best practice recommendations. A written personalized care plan for preventive services as well as general preventive health recommendations were provided to patient.   Signed,  Denman George, LPN  Nurse Health Advisor   Nurse Notes: Patient is asking for an rx for mastectomy bras and prosthesis to [facility] # [phone]

## 2018-12-16 NOTE — Progress Notes (Signed)
Rx signed and faxed.

## 2018-12-29 DIAGNOSIS — H524 Presbyopia: Secondary | ICD-10-CM | POA: Diagnosis not present

## 2018-12-29 DIAGNOSIS — H18593 Other hereditary corneal dystrophies, bilateral: Secondary | ICD-10-CM | POA: Diagnosis not present

## 2018-12-29 DIAGNOSIS — H3562 Retinal hemorrhage, left eye: Secondary | ICD-10-CM | POA: Diagnosis not present

## 2018-12-29 DIAGNOSIS — Z961 Presence of intraocular lens: Secondary | ICD-10-CM | POA: Diagnosis not present

## 2019-01-27 DIAGNOSIS — H353111 Nonexudative age-related macular degeneration, right eye, early dry stage: Secondary | ICD-10-CM | POA: Diagnosis not present

## 2019-01-27 DIAGNOSIS — H353124 Nonexudative age-related macular degeneration, left eye, advanced atrophic with subfoveal involvement: Secondary | ICD-10-CM | POA: Diagnosis not present

## 2019-01-27 DIAGNOSIS — H35012 Changes in retinal vascular appearance, left eye: Secondary | ICD-10-CM | POA: Diagnosis not present

## 2019-01-29 ENCOUNTER — Other Ambulatory Visit: Payer: Self-pay | Admitting: Family Medicine

## 2019-02-09 ENCOUNTER — Other Ambulatory Visit: Payer: Self-pay

## 2019-02-10 ENCOUNTER — Encounter: Payer: Self-pay | Admitting: Family Medicine

## 2019-02-10 ENCOUNTER — Ambulatory Visit (INDEPENDENT_AMBULATORY_CARE_PROVIDER_SITE_OTHER): Payer: Medicare Other | Admitting: Family Medicine

## 2019-02-10 VITALS — BP 120/82 | HR 61 | Temp 97.6°F | Ht 68.0 in | Wt 145.6 lb

## 2019-02-10 DIAGNOSIS — E785 Hyperlipidemia, unspecified: Secondary | ICD-10-CM | POA: Diagnosis not present

## 2019-02-10 DIAGNOSIS — N3281 Overactive bladder: Secondary | ICD-10-CM

## 2019-02-10 DIAGNOSIS — N183 Chronic kidney disease, stage 3 unspecified: Secondary | ICD-10-CM

## 2019-02-10 DIAGNOSIS — I82591 Chronic embolism and thrombosis of other specified deep vein of right lower extremity: Secondary | ICD-10-CM

## 2019-02-10 DIAGNOSIS — F324 Major depressive disorder, single episode, in partial remission: Secondary | ICD-10-CM | POA: Diagnosis not present

## 2019-02-10 DIAGNOSIS — E039 Hypothyroidism, unspecified: Secondary | ICD-10-CM

## 2019-02-10 DIAGNOSIS — I480 Paroxysmal atrial fibrillation: Secondary | ICD-10-CM

## 2019-02-10 DIAGNOSIS — M818 Other osteoporosis without current pathological fracture: Secondary | ICD-10-CM | POA: Diagnosis not present

## 2019-02-10 DIAGNOSIS — I10 Essential (primary) hypertension: Secondary | ICD-10-CM

## 2019-02-10 LAB — COMPREHENSIVE METABOLIC PANEL
ALT: 15 U/L (ref 0–35)
AST: 20 U/L (ref 0–37)
Albumin: 4.4 g/dL (ref 3.5–5.2)
Alkaline Phosphatase: 72 U/L (ref 39–117)
BUN: 17 mg/dL (ref 6–23)
CO2: 31 mEq/L (ref 19–32)
Calcium: 10.1 mg/dL (ref 8.4–10.5)
Chloride: 100 mEq/L (ref 96–112)
Creatinine, Ser: 1.01 mg/dL (ref 0.40–1.20)
GFR: 52.71 mL/min — ABNORMAL LOW (ref >60.00)
Glucose, Bld: 93 mg/dL (ref 70–99)
Potassium: 4.6 mEq/L (ref 3.5–5.1)
Sodium: 137 mEq/L (ref 135–145)
Total Bilirubin: 0.5 mg/dL (ref 0.2–1.2)
Total Protein: 7.4 g/dL (ref 6.0–8.3)

## 2019-02-10 LAB — LIPID PANEL
Cholesterol: 198 mg/dL (ref 0–200)
HDL: 41.4 mg/dL (ref >39.00)
NonHDL: 156.8
Total CHOL/HDL Ratio: 5
Triglycerides: 269 mg/dL — ABNORMAL HIGH (ref 0.0–149.0)
VLDL: 53.8 mg/dL — ABNORMAL HIGH (ref 0.0–40.0)

## 2019-02-10 LAB — CBC WITH DIFFERENTIAL/PLATELET
Basophils Absolute: 0 10*3/uL (ref 0.0–0.1)
Basophils Relative: 0.7 % (ref 0.0–3.0)
Eosinophils Absolute: 0.2 10*3/uL (ref 0.0–0.7)
Eosinophils Relative: 2.6 % (ref 0.0–5.0)
HCT: 38.6 % (ref 36.0–46.0)
Hemoglobin: 13.2 g/dL (ref 12.0–15.0)
Lymphocytes Relative: 22.4 % (ref 12.0–46.0)
Lymphs Abs: 1.4 10*3/uL (ref 0.7–4.0)
MCHC: 34.3 g/dL (ref 30.0–36.0)
MCV: 91.7 fl (ref 78.0–100.0)
Monocytes Absolute: 0.5 10*3/uL (ref 0.1–1.0)
Monocytes Relative: 8.7 % (ref 3.0–12.0)
Neutro Abs: 4.1 10*3/uL (ref 1.4–7.7)
Neutrophils Relative %: 65.6 % (ref 43.0–77.0)
Platelets: 222 10*3/uL (ref 150.0–400.0)
RBC: 4.21 Mil/uL (ref 3.87–5.11)
RDW: 13 % (ref 11.5–15.5)
WBC: 6.3 10*3/uL (ref 4.0–10.5)

## 2019-02-10 LAB — TSH: TSH: 3.29 u[IU]/mL (ref 0.35–4.50)

## 2019-02-10 LAB — LDL CHOLESTEROL, DIRECT: Direct LDL: 116 mg/dL

## 2019-02-10 MED ORDER — FAMOTIDINE 20 MG PO TABS: 20.0000 mg | ORAL_TABLET | Freq: Two times a day (BID) | ORAL | 5 refills | Status: AC

## 2019-02-10 NOTE — Patient Instructions (Addendum)
Please stop by lab before you go If you do not have mychart- we will call you about results within 5 business days of [place] receiving them.  If you have mychart- we will send your results within 3 business days of [place] receiving them.  If abnormal or we want to clarify a result, we will call or mychart you to make sure you receive the message.  If you have questions or concerns or don't hear within 5-7 days, please send [place] a message or call us.   For knee pain- Since tylenol is helpful that is reasonable to continue- could also try voltaren gel 1% over the counter - this is an antiinflammatory that may help but also would not get absorbed as much as taking it by mouth so likely shouldn't bother stomach or influence bleeding risk   We are going to try pepcid instead of nexium because it is better for kidneys and bone health.  pepcid 20mg  she will need to take twice a day before meals.    Also looks like you are due for cardiology follow up- would be reasonable to get their opinion on options since you have not been cleared for xarelto at this point.   I agree its reasonable to work on Prolia with Dr. Jana Hakim

## 2019-02-10 NOTE — Progress Notes (Addendum)
Phone [phone] In person visit   Subjective:   Laura Li is a 78 y.o. year old very pleasant female patient who presents for/with See problem oriented charting Chief Complaint  Patient presents with  . Follow-up   ROS- No chest pain or shortness of breath. 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 No fever or chills   This visit occurred during the SARS-CoV-2 public health emergency.  Safety protocols were in place, including screening questions prior to the visit, additional usage of staff PPE, and extensive cleaning of exam room while observing appropriate contact time as indicated for disinfecting solutions.   Past Medical History-  Patient Active Problem List   Diagnosis Date Noted  . MCI (mild cognitive impairment) 10/16/2016    Priority: High  . Paroxysmal atrial fibrillation ([place]) 06/04/2016    Priority: High  . Chronic deep vein thrombosis (DVT) (HCC) 09/01/2015    Priority: High  . Pneumothorax 06/15/2014    Priority: High  . History of left breast cancer 04/24/2014    Priority: High  . History of right breast cancer 04/24/2014    Priority: High  . Vasculitis ([place]) 11/09/2012    Priority: High  . Depression, major, single episode, in partial remission ([place]) 04/24/2017    Priority: Medium  . Overactive bladder 09/19/2016    Priority: Medium  . Acute lower UTI 05/29/2016    Priority: Medium  . Osteoporosis 05/23/2016    Priority: Medium  . Chronic kidney disease, stage III (moderate) 12/09/2012    Priority: Medium  . 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    Priority: Medium  . Hypothyroidism 02/06/2006    Priority: Medium  . Hyperlipidemia 02/06/2006    Priority: Medium  . Essential hypertension 02/06/2006    Priority: Medium  . Localized swelling, mass, or lump of lower extremity 11/05/2014    Priority: Low  . Diverticulitis 08/09/2014    Priority: Low  . Family history of breast cancer 04/29/2014    Priority:  Low  . GERD (gastroesophageal reflux disease) 03/31/2014    Priority: Low  . Hot flashes 03/31/2014    Priority: Low  . Osteoarthritis 03/31/2014    Priority: Low  . Allergic rhinitis 02/06/2006    Priority: Low  . Disorder of bone and cartilage 05/23/2017    Medications- reviewed and updated Current Outpatient Medications  Medication Sig Dispense Refill  . acetaminophen (TYLENOL) 500 MG tablet Take 500 mg by mouth 2 (two) times daily as needed for mild pain. Reported on 07/07/2015    . atorvastatin (LIPITOR) 20 MG tablet TAKE 1 TABLET(20 MG) BY MOUTH DAILY 90 tablet 0  . B Complex-C-E-Zn (B COMPLEX-C-E-ZINC) tablet Take 1 tablet by mouth daily.    . Calcium-Vitamin D (CALTRATE 600 PLUS-VIT D PO) Take 1 tablet by mouth 2 (two) times daily.     . carboxymethylcellulose (REFRESH PLUS) 0.5 % SOLN 2 drops 3 (three) times daily as needed.    . fexofenadine (ALLEGRA) 180 MG tablet Take 180 mg by mouth daily as needed for allergies.     [name] ipratropium (ATROVENT) 0.03 % nasal spray Place 2 sprays into the nose every 12 (twelve) hours. 180 mL 3  . levothyroxine (SYNTHROID) 88 MCG tablet TAKE 1 TABLET(88 MCG) BY MOUTH DAILY 90 tablet 3  . Multiple Vitamins-Minerals (CENTRUM SILVER PO) Take 1 tablet by mouth daily.    [name] MYRBETRIQ 25 MG TB24 tablet TAKE 1 TABLET BY MOUTH DAILY 90 tablet 0  . nadolol (CORGARD) 20  MG tablet TAKE 1/2 TABLET(10 MG) BY MOUTH DAILY 45 tablet 2  . Propylene Glycol (SYSTANE BALANCE) 0.6 % SOLN Apply 1-2 drops to eye daily as needed (dry eyes).     . venlafaxine XR (EFFEXOR-XR) 150 MG 24 hr capsule TAKE 1 CAPSULE(150 MG) BY MOUTH DAILY 90 capsule 3  . vitamin B-12 (CYANOCOBALAMIN) 100 MCG tablet Take 100 mcg by mouth daily.    . famotidine (PEPCID) 20 MG tablet Take 1 tablet (20 mg total) by mouth 2 (two) times daily. 60 tablet 5  . Lactobacillus-Inulin (CULTURELLE DIGESTIVE HEALTH PO) Take 1 capsule by mouth daily.     Current Facility-Administered Medications  Medication  Dose Route Frequency Provider Last Rate Last Dose  . Bevacizumab (AVASTIN) SOLN 1.25 mg  1.25 mg Intravitreal  Bernarda Caffey, MD   1.25 mg at 10/10/17 1318  . Bevacizumab (AVASTIN) SOLN 1.25 mg  1.25 mg Intravitreal  Bernarda Caffey, MD   1.25 mg at 11/07/17 1206  . Bevacizumab (AVASTIN) SOLN 1.25 mg  1.25 mg Intravitreal  Bernarda Caffey, MD   1.25 mg at 12/05/17 1212  . Bevacizumab (AVASTIN) SOLN 1.25 mg  1.25 mg Intravitreal  Bernarda Caffey, MD   1.25 mg at 01/04/18 2328  . Bevacizumab (AVASTIN) SOLN 1.25 mg  1.25 mg Intravitreal  Bernarda Caffey, MD   1.25 mg at 01/30/18 1230  . Bevacizumab (AVASTIN) SOLN 1.25 mg  1.25 mg Intravitreal  Bernarda Caffey, MD   1.25 mg at 02/27/18 1315  . Bevacizumab (AVASTIN) SOLN 1.25 mg  1.25 mg Intravitreal  Bernarda Caffey, MD   1.25 mg at 03/27/18 1138  . Bevacizumab (AVASTIN) SOLN 1.25 mg  1.25 mg Intravitreal  Bernarda Caffey, MD   1.25 mg at 05/08/18 1231     Objective:  BP 120/82   Pulse 61   Temp 97.6 F (36.4 C)   Ht 5\' 8"  (1.727 m)   Wt 145 lb 9.6 oz (66 kg)   SpO2 98%   BMI 22.14 kg/m  Gen: NAD, resting comfortably CV: RRR no murmurs rubs or gallops Lungs: CTAB no crackles, wheeze, rhonchi Abdomen: soft/nontender/nondistended/normal bowel sounds. No rebound or guarding.  Ext: no edema or calf pain Skin: warm, dry    Assessment and Plan   #Still enjoying living at [facility] for the most part.  Has a boyfriend there- he came to thanks[place] with family  #Atrial fibrillation paroxysmal/chronic DVT S: Patient remains on rate control with nadolol 10 mg daily.  In the past patient was on Xarelto for chronic DVT as well as for stroke prevention.  Last visit this was on hold due to a subretinal hemorrhage for which she was receiving injections- this continues to be on hold  A/P: Patient with paroxysmal atrial fibrillation-rate controlled on nadolol but also not obviously in atrial fibrillation at this time.  For anticoagulation-that is on hold due to  history of subretinal hemorrhage-I am going to reach out to ophthalmology for their opinion on potential restart in the future-we will continue to hold for now. Sending to Dr. Coralyn Pear "Dr. Coralyn Pear,  Thank you for caring for Ms. Balan for her subretinal hemorrhage. She has history of chronic DVT and a fib and was on xarelto previously. I am continuing to hold at this time but wanted your opinion on if there would be a potential restart time in the future. I am not going to press for this obviously unless there is a stroke or acute DVT (no signs or symptoms of either at  present) but wanted to know your thoughts on restarting if either occurred or even if those did not occur at future date potentially. We could of course try alternate if needed in future like eliquis.   Thanks, [name]"  Addendum after message from Dr. Coralyn Pear "Dr. Yong Channel,   Thank you for reaching out. Her subretinal hemorrhage is now stable, so I think it will be fine for her to restart Xarelto or any other blood thinner at any time now. She just needs to be aware of the risk of potential rebleed or new bleed.   Thanks,  Aaron Edelman "  Called patient back and she is more concerned about risk of subretinal hemorrhage  at present than stroke or clot in leg- she would like to remain off xarelto    #Bilateral knee pain S:pt c/o bilateral knee pain and she would like to discuss possible medication for that. Has taken tylenol and seems to help.  A/P: For knee pain- Since tylenol is helpful that is reasonable to continue- could also try voltaren gel 1% over the counter - this is an antiinflammatory that may help but also would not get absorbed as much as taking it by mouth so likely shouldn't bother stomach or influence bleeding risk    #hypertension S: compliant with nadolol BP Readings from Last 3 Encounters:  02/10/19 120/82  12/15/18 124/68  11/26/18 (!) 137/57  A/P:  Stable. Continue current medications.     #Hyperlipidemia  S: WE have increased atorvastatin to 20 mg from 10 mg A/P: We will update lipid panel today to reassess.  Hopefully improved.  Want to target LDL at least under 100.  #hypothyroidism S: compliant On thyroid medication-levothyroxine 88 mcg Lab Results  Component Value Date   TSH 0.54 10/16/2017  A/P: Has been controlled-update TSH with labs today   .#Overactive bladder S: Compliant with Myrbetriq with reasonable control A/P:   Stable. Continue current medications.    # Depression S: Remains reasonably controlled/in full remission.  She is compliant with Effexor.  Depression screen [facility] 2/9 02/10/2019  Decreased Interest 0  Down, Depressed, Hopeless 0  PHQ - 2 Score 0  Altered sleeping 0  Tired, decreased energy 0  Change in appetite 1  Feeling bad or failure about yourself  0  Trouble concentrating 1  Moving slowly or fidgety/restless 0  Suicidal thoughts 0  PHQ-9 Score 2  Difficult doing work/chores Not difficult at all  Some recent data might be hidden  A/P:  Stable in full remission. Continue current medications.    #GERD/high risk medication-tries to take b12 given she is on PPI. We are going to try pepcid instead of nexium because it is better for kidneys and bone health.  pepcid 20mg  she will need to take twice a day before meals.    #She is considering starting Prolia through oncology-she wanted my opinion and I told her I would be fine with this.  #CKD stage III-avoid NSAIDs orally-I did tell her she could try Voltaren topically.  We will update BMP today.  Recommended follow up: Return in about 6 months (around 08/11/2019) for follow up- or sooner if needed. Future Appointments  Date Time Provider [place]  04/05/2019 10:00 AM Bernarda Caffey, MD [facility] None  08/11/2019 11:00 AM Marin Olp, MD [facility]  09/23/2019  2:00 PM Ward Givens, NP [facility] None  11/25/2019 11:30 AM [facility] 1 [facility] None  11/25/2019 12:00 PM Magrinat, Virgie Dad, MD  [facility] None  Lab/Order associations:   ICD-10-CM   1. Paroxysmal atrial fibrillation (HCC)  I48.0   2. Hypothyroidism, unspecified type  E03.9 TSH  3. Hyperlipidemia, unspecified hyperlipidemia type  E78.5 CBC with Differential/Platelet    Comprehensive metabolic panel    Lipid panel  4. Essential hypertension  I10 CBC with Differential/Platelet    Comprehensive metabolic panel    Lipid panel  5. Chronic deep vein thrombosis (DVT) of other vein of right lower extremity (HCC)  I82.591   6. Depression, major, single episode, in partial remission ([place])  F32.4   7. Stage 3 chronic kidney disease, unspecified whether stage 3a or 3b CKD  N18.30   8. Other osteoporosis without current pathological fracture  M81.8   9. Overactive bladder  N32.81     Meds ordered this encounter  Medications  . famotidine (PEPCID) 20 MG tablet    Sig: Take 1 tablet (20 mg total) by mouth 2 (two) times daily.    Dispense:  60 tablet    Refill:  5    Return precautions advised.  [name], MD

## 2019-02-22 ENCOUNTER — Other Ambulatory Visit: Payer: Self-pay | Admitting: Family Medicine

## 2019-02-27 ENCOUNTER — Other Ambulatory Visit: Payer: Self-pay | Admitting: Family Medicine

## 2019-03-01 DIAGNOSIS — Z20828 Contact with and (suspected) exposure to other viral communicable diseases: Secondary | ICD-10-CM | POA: Diagnosis not present

## 2019-03-01 DIAGNOSIS — Z1159 Encounter for screening for other viral diseases: Secondary | ICD-10-CM | POA: Diagnosis not present

## 2019-03-08 DIAGNOSIS — Z20828 Contact with and (suspected) exposure to other viral communicable diseases: Secondary | ICD-10-CM | POA: Diagnosis not present

## 2019-03-08 DIAGNOSIS — Z1159 Encounter for screening for other viral diseases: Secondary | ICD-10-CM | POA: Diagnosis not present

## 2019-03-15 DIAGNOSIS — Z20828 Contact with and (suspected) exposure to other viral communicable diseases: Secondary | ICD-10-CM | POA: Diagnosis not present

## 2019-03-15 DIAGNOSIS — Z1159 Encounter for screening for other viral diseases: Secondary | ICD-10-CM | POA: Diagnosis not present

## 2019-03-18 DIAGNOSIS — Z20828 Contact with and (suspected) exposure to other viral communicable diseases: Secondary | ICD-10-CM | POA: Diagnosis not present

## 2019-03-18 DIAGNOSIS — Z1159 Encounter for screening for other viral diseases: Secondary | ICD-10-CM | POA: Diagnosis not present

## 2019-03-22 DIAGNOSIS — Z20828 Contact with and (suspected) exposure to other viral communicable diseases: Secondary | ICD-10-CM | POA: Diagnosis not present

## 2019-03-22 DIAGNOSIS — Z1159 Encounter for screening for other viral diseases: Secondary | ICD-10-CM | POA: Diagnosis not present

## 2019-03-25 ENCOUNTER — Other Ambulatory Visit: Payer: Self-pay | Admitting: Family Medicine

## 2019-03-25 DIAGNOSIS — Z20828 Contact with and (suspected) exposure to other viral communicable diseases: Secondary | ICD-10-CM | POA: Diagnosis not present

## 2019-03-25 DIAGNOSIS — Z1159 Encounter for screening for other viral diseases: Secondary | ICD-10-CM | POA: Diagnosis not present

## 2019-03-25 DIAGNOSIS — Z853 Personal history of malignant neoplasm of breast: Secondary | ICD-10-CM

## 2019-03-26 DIAGNOSIS — H5452A1 Low vision left eye category 1, normal vision right eye: Secondary | ICD-10-CM | POA: Diagnosis not present

## 2019-03-26 DIAGNOSIS — R488 Other symbolic dysfunctions: Secondary | ICD-10-CM | POA: Diagnosis not present

## 2019-03-26 NOTE — Progress Notes (Signed)
[facility] Note  04/05/2019     CHIEF COMPLAINT Patient presents for Retina Follow Up   HISTORY OF PRESENT ILLNESS: Laura Li is a 79 y.o. female who presents to the clinic today for:   HPI    Retina Follow Up    In left eye.  This started 11 months ago.  Severity is mild.  Since onset it is stable.  I, the attending physician,  performed the HPI with the patient and updated documentation appropriately.          Comments    6 Mos F/U Subretinal hemorrhage Os. Patient states her vision"has not change since last ov", denies flashes ,floaters and ocular pain.        Last edited by Bernarda Caffey, MD on 04/05/2019  1:36 PM. (History)    pt states her vision has not changed since last visit. Late to f/u, 5 months instead of 3 months.   Referring physician: Marin Olp, MD [place],  [place] [id number]  HISTORICAL INFORMATION:   Selected notes from the MEDICAL RECORD NUMBER    CURRENT MEDICATIONS: Current Outpatient Medications (Ophthalmic Drugs)  Medication Sig  . carboxymethylcellulose (REFRESH PLUS) 0.5 % SOLN 2 drops 3 (three) times daily as needed.  [name] Propylene Glycol (SYSTANE BALANCE) 0.6 % SOLN Apply 1-2 drops to eye daily as needed (dry eyes).    No current facility-administered medications for this visit. (Ophthalmic Drugs)   Current Outpatient Medications (Other)  Medication Sig  . acetaminophen (TYLENOL) 500 MG tablet Take 500 mg by mouth 2 (two) times daily as needed for mild pain. Reported on 07/07/2015  . atorvastatin (LIPITOR) 20 MG tablet TAKE 1 TABLET(20 MG) BY MOUTH DAILY  . B Complex-C-E-Zn (B COMPLEX-C-E-ZINC) tablet Take 1 tablet by mouth daily.  . Calcium-Vitamin D (CALTRATE 600 PLUS-VIT D PO) Take 1 tablet by mouth 2 (two) times daily.   . famotidine (PEPCID) 20 MG tablet Take 1 tablet (20 mg total) by mouth 2 (two) times daily.  . fexofenadine (ALLEGRA) 180 MG tablet Take 180 mg by mouth daily as  needed for allergies.   [name] ipratropium (ATROVENT) 0.03 % nasal spray Place 2 sprays into the nose every 12 (twelve) hours.  . Lactobacillus-Inulin (CULTURELLE DIGESTIVE HEALTH PO) Take 1 capsule by mouth daily.  [name] levothyroxine (SYNTHROID) 88 MCG tablet TAKE 1 TABLET(88 MCG) BY MOUTH DAILY  . Multiple Vitamins-Minerals (CENTRUM SILVER PO) Take 1 tablet by mouth daily.  [name] MYRBETRIQ 25 MG TB24 tablet TAKE 1 TABLET BY MOUTH DAILY  . nadolol (CORGARD) 20 MG tablet TAKE 1/2 TABLET(10 MG) BY MOUTH DAILY  . venlafaxine XR (EFFEXOR-XR) 150 MG 24 hr capsule TAKE 1 CAPSULE(150 MG) BY MOUTH DAILY  . vitamin B-12 (CYANOCOBALAMIN) 100 MCG tablet Take 100 mcg by mouth daily.   Current Facility-Administered Medications (Other)  Medication Route  . Bevacizumab (AVASTIN) SOLN 1.25 mg Intravitreal  . Bevacizumab (AVASTIN) SOLN 1.25 mg Intravitreal  . Bevacizumab (AVASTIN) SOLN 1.25 mg Intravitreal  . Bevacizumab (AVASTIN) SOLN 1.25 mg Intravitreal  . Bevacizumab (AVASTIN) SOLN 1.25 mg Intravitreal  . Bevacizumab (AVASTIN) SOLN 1.25 mg Intravitreal  . Bevacizumab (AVASTIN) SOLN 1.25 mg Intravitreal  . Bevacizumab (AVASTIN) SOLN 1.25 mg Intravitreal      REVIEW OF SYSTEMS: ROS    Positive for: Eyes   Negative for: Constitutional, Gastrointestinal, Neurological, Skin, Genitourinary, Musculoskeletal, HENT, Endocrine, Cardiovascular, Respiratory, Psychiatric, Allergic/Imm, Heme/Lymph   Last edited by Zenovia Jordan, LPN  on 04/05/2019 10:13 AM. (History)       ALLERGIES Allergies  Allergen Reactions  . Dyazide [Hydrochlorothiazide W-Triamterene]     Presumption: drug induced vasculitis  . Niacin Other (See Comments)    flushing  . Penicillins Diarrhea and Nausea And Vomiting    PAST MEDICAL HISTORY Past Medical History:  Diagnosis Date  . Anemia    from medicaions  . Arthritis   . Chronic kidney disease    some renal impairment from meds  . Depression    states no depression  . Eczema    . Esophageal stricture   . GERD (gastroesophageal reflux disease)   . Headache   . Heart murmur    states it is benign told 30-40 years ago  . History of DVT (deep vein thrombosis)   . HX: breast cancer    melanoma  . Hyperlipidemia   . Hypertension   . Hypothyroidism   . Melanoma ([place])   . OA (osteoarthritis)   . Personal history of chemotherapy 2016   Left Breast Cancer  . Personal history of radiation therapy 2016   Left Breast Cancer  . Pneumothorax on right 06/15/2014  . PONV (postoperative nausea and vomiting)   . Wears glasses    Past Surgical History:  Procedure Laterality Date  . APPENDECTOMY  1967  . BREAST EXCISIONAL BIOPSY Left   . BREAST LUMPECTOMY Left 2016  . BREAST SURGERY  1977   removal of calcified milk gland right breast  . CHEST TUBE INSERTION  06/15/2014  . CHEST TUBE INSERTION Bilateral 06/15/2014   Procedure: CHEST TUBE INSERTION;  Surgeon: Rolm Bookbinder, MD;  Location: [facility];  Service: General;  Laterality: Bilateral;  . COLONOSCOPY    . [place]   right breast  . MASTECTOMY Right 1998   right-nodes out  . MELANOMA EXCISION  2004   right side of face  . MOHS SURGERY  2005   right left-basal cell  . PORT-A-CATH REMOVAL N/A 10/05/2014   Procedure: REMOVAL PORT-A-CATH;  Surgeon: Rolm Bookbinder, MD;  Location: [facility] [place];  Service: General;  Laterality: N/A;  . PORTACATH PLACEMENT Left 06/15/2014   Procedure: INSERTION PORT-A-CATH;  Surgeon: Rolm Bookbinder, MD;  Location: [place] [facility];  Service: General;  Laterality: Left;  . RADIOACTIVE SEED GUIDED PARTIAL MASTECTOMY WITH AXILLARY SENTINEL LYMPH NODE BIOPSY Left 05/05/2014   Procedure: RADIOACTIVE SEED GUIDED LEFT LUMPECTOMY WITH AXILLARY SENTINEL LYMPH NODE BIOPSY;  Surgeon: Rolm Bookbinder, MD;  Location: [facility];  Service: General;  Laterality: Left;  . TONSILLECTOMY AND ADENOIDECTOMY  1949  . TOTAL ABDOMINAL HYSTERECTOMY  1992   including ovaries     FAMILY HISTORY Family History  Problem Relation Age of Onset  . Heart disease Father 10       smoker  . Heart attack Father   . Endometrial cancer Mother 74  . Breast cancer Cousin        x 3 cousins with breast cancer (one at 94, one at 26 and one at 15)  . Lung cancer Paternal Aunt        smoker  . Brain cancer Maternal Aunt   . Diabetes Maternal Aunt        ???  . Breast cancer Maternal Aunt 51  . Healthy Sister   . Tuberculosis Maternal Grandfather   . Cervical cancer Paternal Grandmother   . Healthy Daughter   . Colon cancer Neg Hx   . Amblyopia Neg Hx   .  Blindness Neg Hx   . Cataracts Neg Hx   . Glaucoma Neg Hx   . Macular degeneration Neg Hx   . Retinal detachment Neg Hx   . Strabismus Neg Hx   . Retinitis pigmentosa Neg Hx     SOCIAL HISTORY Social History   Tobacco Use  . Smoking status: Former Smoker    Packs/day: 0.75    Years: 3.00    Pack years: 2.25    Types: Cigarettes    Quit date: 03/11/1969    Years since quitting: 50.1  . Smokeless tobacco: Never Used  Substance Use Topics  . Alcohol use: Yes    Alcohol/week: 0.0 standard drinks    Comment: occasional wine  . Drug use: No         OPHTHALMIC EXAM:  Base Eye Exam    Visual Acuity (Snellen - Linear)      Right Left   Dist cc 20/20 -1 20/250 -1   Dist ph cc NI 20/250   Correction: Glasses       Tonometry (Tonopen, 10:12 AM)      Right Left   Pressure 16 16       Pupils      Dark Light Shape React APD   Right 3 2 Round Brisk None   Left 3 2 Round Brisk None       Visual Fields      Left Right    Full Full       Extraocular Movement      Right Left    Full, Ortho Full, Ortho       Neuro/Psych    Oriented x3: Yes   Mood/Affect: Normal       Dilation    Both eyes: 1.0% Mydriacyl, 2.5% Phenylephrine @ 10:12 AM        Slit Lamp and Fundus Exam    Slit Lamp Exam      Right Left   Lids/Lashes Dermatochalasis - upper lid, Telangiectasia Dermatochalasis - upper  lid, Telangiectasia   Conjunctiva/Sclera White and quiet White and quiet   Cornea Arcus, Anterior basement membrane dystrophy, Temporal Well healed cataract wounds, nasal LRI Arcus, Anterior basement membrane dystrophy, Inferior 2+ Punctate epithelial erosions, Temporal Well healed cataract wounds   Anterior Chamber Deep and quiet Deep and quiet   Iris Round and dilated Round and dilated   Lens Multifocal PC IOL in good position with open PC Multifocal PC IOL in good position with open PC   Vitreous Vitreous syneresis, Posterior vitreous detachment Vitreous syneresis, Posterior vitreous detachment       Fundus Exam      Right Left   Disc Pink and Sharp,  trace pallor Pink and Sharp, Tilted disc, mild temporal pallor   C/D Ratio 0.45 0.5   Macula flat, Blunted foveal reflex, mild Retinal pigment epithelial mottling, No heme or edema Flat, blunted foveal reflex, pigmented atrophy and sub-retinal scar ST macula, sub-retinal heme completely resolved--just residual pigment changes improving   Vessels Mild Vascular attenuation Vascular attenuation   Periphery Attached Attached, No RT/RD          IMAGING AND PROCEDURES  Imaging and Procedures for @TODAY @  OCT, Retina - OU - Both Eyes       Right Eye Quality was good. Central Foveal Thickness: 241. Progression has been stable. Findings include normal foveal contour, no IRF, no SRF (No drusen).   Left Eye Quality was good. Central Foveal Thickness: 247. Progression has improved. Findings include subretinal  hyper-reflective material, outer retinal atrophy, no IRF, retinal drusen , no SRF, normal foveal contour (Mild interval improvement in [facility]).   Notes *Images captured and stored on drive  Diagnosis / Impression:  OD: NFP, No IRF/SRF-stable OS: interval improvement in [facility]   Clinical management:  See below  Abbreviations: NFP - Normal foveal profile. CME - cystoid macular edema. PED - pigment epithelial detachment. IRF -  intraretinal fluid. SRF - subretinal fluid. EZ - ellipsoid zone. ERM - epiretinal membrane. ORA - outer retinal atrophy. ORT - outer retinal tubulation. SRHM - subretinal hyper-reflective material         Fluorescein Angiography Optos (Transit OS)       Right Eye   Progression has been stable. Early phase findings include normal observations. Mid/Late phase findings include normal observations.   Left Eye   Progression has been stable. Early phase findings include blockage, staining, window defect. Mid/Late phase findings include blockage, staining, window defect (Large window defect corresponding to previous area of sub-retinal hemorrhage, interval decrease in blockage).   Notes *Images captured and stored on drive;   Impression: OD: normal study OS: large window defect corresponding to previous area of sub-retinal hemorrhage, interval decrease in blockage                  ASSESSMENT/PLAN:    ICD-10-CM   1. 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 Retinal edema  H35.81 OCT, Retina - OU - Both Eyes  3. Valsalva retinopathy  H35.00   4. Subretinal hemorrhage of left eye  H35.62   5. Essential hypertension  I10   6. Hypertensive retinopathy of both eyes  H35.033 Fluorescein Angiography Optos (Transit OS)  7. Pseudophakia of both eyes  Z96.1   8. Exudative age-related macular degeneration of left eye with active choroidal neovascularization ([place])  H35.3221     1-4. Retinal macroaneurysm with large sub-macular hemorrhage OS-   - onset of decreased vision associated with violent vomiting/wretching   - on xarelto for history of DVT -- pt stopped temporarily  - exam shows improvement / evolution of large submacular hemorrhage with mild preretinal heme  - retinal macroaneurysm visible on exam and prior FA -- improved and stably inactive today  - differential also includes exudative ARMD -- see #8 below  - systemic work-up for hypertension and systemic vascular  disease reviewed by Dr. Yong Channel -- risk factors optimized  - S/P IVA OS #1 (08.02.19), #2 (08.30.19), #3 (09.27.19), #4 (10.25.19), #5 (11.22.19), #6 (12.20.19), #7 (01.17.20), #8 (2.28.2020)  - delayed f/u 5 mos rather than 3 month f/u  - interval resolution of subretinal heme -- areas of cleared subretinal heme now atrophic  - BCVA decreased from 20/80-2 to 20/250 today OS -- significant decline in [place] since last visit despite improvement and interval clearance of subretinal heme    -FA shows interval decrease in blockage OS with large window defect corresponding to previous area of sub-retinal heme, today on 01.25.21  - OCT shows significant outer retinal atrophy -- likely cause of severe vision loss  - discussed findings and guarded prognosis and treatment options  - recommend holding injection today -- pt in agreement  - F/U 6 months, sooner prn -- DFE/OCT  5,6. Hypertensive retinopathy OU  - discussed importance of tight BP control  - monitor  7. Pseudophakia OU  - s/p CE/IOL  - beautiful surgery, doing well  - monitor  8. Exudative age related macular degeneration, both eyes.    -  s/p IVA OS #1 8.2.19, #2 (08.30.19), #3 (09.27.19), #4 (10.25.19), #5 (11.22.19), #6 (12.20.19), #7 (01.17.20) #8 (2.28.2020)  - f/u 6 months, sooner prn   Ophthalmic Meds Ordered this visit:  No orders of the defined types were placed in this encounter.      Return in about 6 months (around 10/03/2019) for DFE, OCT.  There are no Patient Instructions on file for this visit.   Explained the diagnoses, plan, and follow up with the patient and they expressed understanding.  Patient expressed understanding of the importance of proper follow up care.   This document serves as a record of services personally performed by Gardiner Sleeper, MD, PhD. It was created on their behalf by Roselee Nova, COMT. The creation of this record is the provider's dictation and/or activities during the  visit.  Electronically signed by: Roselee Nova, COMT 04/05/19 1:37 PM  Gardiner Sleeper, M.D., Ph.D. Diseases & Surgery of the Retina and [place] 04/05/2019   I have reviewed the above documentation for accuracy and completeness, and I agree with the above. Gardiner Sleeper, M.D., Ph.D. 04/05/19 1:39 PM   Abbreviations: M myopia (nearsighted); A astigmatism; H hyperopia (farsighted); P presbyopia; Mrx spectacle prescription;  CTL contact lenses; OD right eye; OS left eye; OU both eyes  XT exotropia; ET esotropia; PEK punctate epithelial keratitis; PEE punctate epithelial erosions; DES dry eye syndrome; MGD meibomian gland dysfunction; ATs artificial tears; PFAT's preservative free artificial tears; [place] nuclear sclerotic cataract; PSC posterior subcapsular cataract; ERM epi-retinal membrane; PVD posterior vitreous detachment; RD retinal detachment; DM diabetes mellitus; DR diabetic retinopathy; NPDR non-proliferative diabetic retinopathy; PDR proliferative diabetic retinopathy; CSME clinically significant macular edema; DME diabetic macular edema; dbh dot blot hemorrhages; CWS cotton wool spot; POAG primary open angle glaucoma; C/D cup-to-disc ratio; HVF humphrey visual field; GVF goldmann visual field; OCT optical coherence tomography; IOP intraocular pressure; BRVO Branch retinal vein occlusion; CRVO central retinal vein occlusion; CRAO central retinal artery occlusion; BRAO branch retinal artery occlusion; RT retinal tear; SB scleral buckle; PPV pars plana vitrectomy; VH Vitreous hemorrhage; PRP panretinal laser photocoagulation; IVK intravitreal kenalog; VMT vitreomacular traction; MH Macular hole;  NVD neovascularization of the disc; NVE neovascularization elsewhere; AREDS age related eye disease study; ARMD age related macular degeneration; POAG primary open angle glaucoma; EBMD epithelial/anterior basement membrane dystrophy; ACIOL anterior chamber intraocular  lens; IOL intraocular lens; PCIOL posterior chamber intraocular lens; Phaco/IOL phacoemulsification with intraocular lens placement; [place] photorefractive keratectomy; LASIK laser assisted in situ keratomileusis; HTN hypertension; DM diabetes mellitus; COPD chronic obstructive pulmonary disease

## 2019-03-29 DIAGNOSIS — Z1159 Encounter for screening for other viral diseases: Secondary | ICD-10-CM | POA: Diagnosis not present

## 2019-03-29 DIAGNOSIS — Z20828 Contact with and (suspected) exposure to other viral communicable diseases: Secondary | ICD-10-CM | POA: Diagnosis not present

## 2019-03-31 DIAGNOSIS — R488 Other symbolic dysfunctions: Secondary | ICD-10-CM | POA: Diagnosis not present

## 2019-03-31 DIAGNOSIS — H5452A1 Low vision left eye category 1, normal vision right eye: Secondary | ICD-10-CM | POA: Diagnosis not present

## 2019-04-01 ENCOUNTER — Other Ambulatory Visit: Payer: Self-pay

## 2019-04-01 ENCOUNTER — Ambulatory Visit (INDEPENDENT_AMBULATORY_CARE_PROVIDER_SITE_OTHER): Payer: Medicare Other | Admitting: Family Medicine

## 2019-04-01 ENCOUNTER — Encounter: Payer: Self-pay | Admitting: Family Medicine

## 2019-04-01 VITALS — BP 126/78 | HR 72 | Temp 97.3°F | Ht 68.0 in | Wt 144.4 lb

## 2019-04-01 DIAGNOSIS — H5452A1 Low vision left eye category 1, normal vision right eye: Secondary | ICD-10-CM | POA: Diagnosis not present

## 2019-04-01 DIAGNOSIS — Z23 Encounter for immunization: Secondary | ICD-10-CM | POA: Diagnosis not present

## 2019-04-01 DIAGNOSIS — R358 Other polyuria: Secondary | ICD-10-CM

## 2019-04-01 DIAGNOSIS — G3184 Mild cognitive impairment, so stated: Secondary | ICD-10-CM

## 2019-04-01 DIAGNOSIS — R488 Other symbolic dysfunctions: Secondary | ICD-10-CM | POA: Diagnosis not present

## 2019-04-01 DIAGNOSIS — R3589 Other polyuria: Secondary | ICD-10-CM

## 2019-04-01 LAB — POC URINALSYSI DIPSTICK (AUTOMATED)
Bilirubin, UA: NEGATIVE
Blood, UA: NEGATIVE
Glucose, UA: NEGATIVE
Ketones, UA: NEGATIVE
Nitrite, UA: NEGATIVE
Protein, UA: NEGATIVE
Spec Grav, UA: 1.01 (ref 1.010–1.025)
Urobilinogen, UA: 0.2 E.U./dL
pH, UA: 6 (ref 5.0–8.0)

## 2019-04-01 NOTE — Addendum Note (Signed)
Addended by: Francis Dowse T on: 04/01/2019 02:41 PM   Modules accepted: Orders

## 2019-04-01 NOTE — Progress Notes (Signed)
Phone [phone] In person visit   Subjective:   Laura Li is a 79 y.o. year old very pleasant female patient who presents for/with See problem oriented charting Chief Complaint  Patient presents with  . Altered Mental Status   This visit occurred during the SARS-CoV-2 public health emergency.  Safety protocols were in place, including screening questions prior to the visit, additional usage of staff PPE, and extensive cleaning of exam room while observing appropriate contact time as indicated for disinfecting solutions.   Past Medical History-  Patient Active Problem List   Diagnosis Date Noted  . MCI (mild cognitive impairment) 10/16/2016    Priority: High  . Paroxysmal atrial fibrillation ([place]) 06/04/2016    Priority: High  . Chronic deep vein thrombosis (DVT) ([facility]) 09/01/2015    Priority: High  . Pneumothorax 06/15/2014    Priority: High  . History of left breast cancer 04/24/2014    Priority: High  . History of right breast cancer 04/24/2014    Priority: High  . Vasculitis ([place]) 11/09/2012    Priority: High  . Depression, major, single episode, in partial remission ([place]) 04/24/2017    Priority: Medium  . Overactive bladder 09/19/2016    Priority: Medium  . Acute lower UTI 05/29/2016    Priority: Medium  . Osteoporosis 05/23/2016    Priority: Medium  . Chronic kidney disease, stage III (moderate) 12/09/2012    Priority: Medium  . 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    Priority: Medium  . Hypothyroidism 02/06/2006    Priority: Medium  . Hyperlipidemia 02/06/2006    Priority: Medium  . Essential hypertension 02/06/2006    Priority: Medium  . Localized swelling, mass, or lump of lower extremity 11/05/2014    Priority: Low  . Diverticulitis 08/09/2014    Priority: Low  . Family history of breast cancer 04/29/2014    Priority: Low  . GERD (gastroesophageal reflux disease) 03/31/2014    Priority: Low  . Hot flashes 03/31/2014    Priority: Low  .  Osteoarthritis 03/31/2014    Priority: Low  . Allergic rhinitis 02/06/2006    Priority: Low  . Disorder of bone and cartilage 05/23/2017    Medications- reviewed and updated Current Outpatient Medications  Medication Sig Dispense Refill  . acetaminophen (TYLENOL) 500 MG tablet Take 500 mg by mouth 2 (two) times daily as needed for mild pain. Reported on 07/07/2015    . atorvastatin (LIPITOR) 20 MG tablet TAKE 1 TABLET(20 MG) BY MOUTH DAILY 90 tablet 0  . B Complex-C-E-Zn (B COMPLEX-C-E-ZINC) tablet Take 1 tablet by mouth daily.    . Calcium-Vitamin D (CALTRATE 600 PLUS-VIT D PO) Take 1 tablet by mouth 2 (two) times daily.     . carboxymethylcellulose (REFRESH PLUS) 0.5 % SOLN 2 drops 3 (three) times daily as needed.    . famotidine (PEPCID) 20 MG tablet Take 1 tablet (20 mg total) by mouth 2 (two) times daily. 60 tablet 5  . fexofenadine (ALLEGRA) 180 MG tablet Take 180 mg by mouth daily as needed for allergies.     [name] ipratropium (ATROVENT) 0.03 % nasal spray Place 2 sprays into the nose every 12 (twelve) hours. 180 mL 3  . Lactobacillus-Inulin (CULTURELLE DIGESTIVE HEALTH PO) Take 1 capsule by mouth daily.    [name] levothyroxine (SYNTHROID) 88 MCG tablet TAKE 1 TABLET(88 MCG) BY MOUTH DAILY 90 tablet 3  . Multiple Vitamins-Minerals (CENTRUM SILVER PO) Take 1 tablet by mouth daily.    [name] MYRBETRIQ 25 MG TB24  tablet TAKE 1 TABLET BY MOUTH DAILY 90 tablet 0  . nadolol (CORGARD) 20 MG tablet TAKE 1/2 TABLET(10 MG) BY MOUTH DAILY 45 tablet 2  . Propylene Glycol (SYSTANE BALANCE) 0.6 % SOLN Apply 1-2 drops to eye daily as needed (dry eyes).     . venlafaxine XR (EFFEXOR-XR) 150 MG 24 hr capsule TAKE 1 CAPSULE(150 MG) BY MOUTH DAILY 90 capsule 3  . vitamin B-12 (CYANOCOBALAMIN) 100 MCG tablet Take 100 mcg by mouth daily.     Current Facility-Administered Medications  Medication Dose Route Frequency Provider Last Rate Last Admin  . Bevacizumab (AVASTIN) SOLN 1.25 mg  1.25 mg Intravitreal  Bernarda Caffey, MD   1.25 mg at 10/10/17 1318  . Bevacizumab (AVASTIN) SOLN 1.25 mg  1.25 mg Intravitreal  Bernarda Caffey, MD   1.25 mg at 11/07/17 1206  . Bevacizumab (AVASTIN) SOLN 1.25 mg  1.25 mg Intravitreal  Bernarda Caffey, MD   1.25 mg at 12/05/17 1212  . Bevacizumab (AVASTIN) SOLN 1.25 mg  1.25 mg Intravitreal  Bernarda Caffey, MD   1.25 mg at 01/04/18 2328  . Bevacizumab (AVASTIN) SOLN 1.25 mg  1.25 mg Intravitreal  Bernarda Caffey, MD   1.25 mg at 01/30/18 1230  . Bevacizumab (AVASTIN) SOLN 1.25 mg  1.25 mg Intravitreal  Bernarda Caffey, MD   1.25 mg at 02/27/18 1315  . Bevacizumab (AVASTIN) SOLN 1.25 mg  1.25 mg Intravitreal  Bernarda Caffey, MD   1.25 mg at 03/27/18 1138  . Bevacizumab (AVASTIN) SOLN 1.25 mg  1.25 mg Intravitreal  Bernarda Caffey, MD   1.25 mg at 05/08/18 1231     Objective:  BP 126/78   Pulse 72   Temp (!) 97.3 F (36.3 C) (Temporal)   Ht 5\' 8"  (1.727 m)   Wt 144 lb 6.4 oz (65.5 kg)   SpO2 96%   BMI 21.96 kg/m  Gen: NAD, resting comfortably CV: RRR  Lungs: nonlabored, normal respiratory rate Abdomen: soft/nondistended Ext: no edema Skin: warm, dry Neuro: CN II-XII intact, sensation and reflexes normal throughout, 5/5 muscle strength in bilateral upper and lower extremities. Normal finger to nose. Normal rapid alternating movements. No pronator drift. Normal romberg. Normal gait.      Assessment and Plan   # Mild cognitive impairment/ polyuria S: Patient has seen Dr. Brett Fairy last 07/21/2017. Saw Ward Givens 09/24/2018  Patient daughter has noticed issues with short term memory and confusion between dreams versus reality. Daughter will talk about times or plans and mother/patient will forget rather quickly.  Has started noticing issues with it in the last few weeks. Denies any urinary symptoms or fever. Patient feels like she has a hard time remembering words  On Saturday she thought she was having hallucinations- sounds like may have actually had dreams that she then  thinks the dreams were true. These issues tend to revolve around new boyfriend and some relational issues. Vivid dreams- in past had occurred off effexor but did not miss doses that she recalls or per med management. She tells me when daughter steps out they are sexual dreams with more than 1 partner (not just her boyfriend). She is confident these are not actually happening.   Has some urinary frequency but she thinks its her baseline. In past has had issues with UTI or med changes with memory- plus new variable of relationship daughter wonders about.   No trouble with self care at present other than Has started to use medication management at [facility] but patient did  not remember signing up.  A/P:  Patient with mild cognitive impairment with recent worsening. Neuro exam reassuring- doubt CVA. Per family history of UTI with worsening memory issues and does have polyuria but appears largely stable- I agreed to UA and culture- if negative for infection advised follow up with Dr. Brett Fairy of neurology who follows her for cogntive impairment (no meds at present). We discussed chance of progression to dementia. Opted not to repeat bloodwork such as TSH as done in December.     Recommended follow up: scheduled for June Future Appointments  Date Time Provider [place]  04/05/2019 10:00 AM Bernarda Caffey, MD T[facility]-TRE None  04/22/2019  3:40 PM [facility] DIAG TOMO 1 [facility]GMM GI-BREAST CE  08/11/2019 11:00 AM Marin Olp, MD [facility] PEC  09/23/2019  2:00 PM Ward Givens, NP [facility] None  11/25/2019 11:30 AM [facility] LAB 1 [facility] None  11/25/2019 12:00 PM Magrinat, Virgie Dad, MD [facility] None    Lab/Order associations:   ICD-10-CM   1. Polyuria  R35.8 POCT Urinalysis Dipstick (Automated)    Urine Culture  2. MCI (mild cognitive impairment)  G31.84     Return precautions advised.  [name], MD

## 2019-04-01 NOTE — Patient Instructions (Addendum)
Please stop by lab before you go If you do not have mychart- we will call you about results within 5 business days of [place] receiving them.  If you have mychart- we will send your results within 3 business days of [place] receiving them.  If abnormal or we want to clarify a result, we will call or mychart you to make sure you receive the message.  If you have questions or concerns or don't hear within 5-7 days, please send [place] a message or call us.    Make sure no UTI  If not- lets get you into neurology for sooner follow up.

## 2019-04-02 LAB — URINE CULTURE
MICRO NUMBER:: 10066478
Result:: NO GROWTH
SPECIMEN QUALITY:: ADEQUATE

## 2019-04-02 NOTE — Progress Notes (Signed)
No urinary tract infection was found-lets follow-up with neurology for their thoughts on recent changes in memory

## 2019-04-05 ENCOUNTER — Ambulatory Visit (INDEPENDENT_AMBULATORY_CARE_PROVIDER_SITE_OTHER): Payer: Medicare Other | Admitting: Ophthalmology

## 2019-04-05 ENCOUNTER — Other Ambulatory Visit: Payer: Self-pay

## 2019-04-05 ENCOUNTER — Encounter (INDEPENDENT_AMBULATORY_CARE_PROVIDER_SITE_OTHER): Payer: Self-pay | Admitting: Ophthalmology

## 2019-04-05 DIAGNOSIS — H353221 Exudative age-related macular degeneration, left eye, with active choroidal neovascularization: Secondary | ICD-10-CM | POA: Diagnosis not present

## 2019-04-05 DIAGNOSIS — Z961 Presence of intraocular lens: Secondary | ICD-10-CM

## 2019-04-05 DIAGNOSIS — Z1159 Encounter for screening for other viral diseases: Secondary | ICD-10-CM | POA: Diagnosis not present

## 2019-04-05 DIAGNOSIS — H3500 Unspecified background retinopathy: Secondary | ICD-10-CM

## 2019-04-05 DIAGNOSIS — H35033 Hypertensive retinopathy, bilateral: Secondary | ICD-10-CM

## 2019-04-05 DIAGNOSIS — I10 Essential (primary) hypertension: Secondary | ICD-10-CM

## 2019-04-05 DIAGNOSIS — H3562 Retinal hemorrhage, left eye: Secondary | ICD-10-CM

## 2019-04-05 DIAGNOSIS — H35012 Changes in retinal vascular appearance, left eye: Secondary | ICD-10-CM

## 2019-04-05 DIAGNOSIS — H3581 Retinal edema: Secondary | ICD-10-CM

## 2019-04-05 DIAGNOSIS — Z20828 Contact with and (suspected) exposure to other viral communicable diseases: Secondary | ICD-10-CM | POA: Diagnosis not present

## 2019-04-07 ENCOUNTER — Encounter: Payer: Self-pay | Admitting: Family Medicine

## 2019-04-08 DIAGNOSIS — Z1159 Encounter for screening for other viral diseases: Secondary | ICD-10-CM | POA: Diagnosis not present

## 2019-04-08 DIAGNOSIS — Z20828 Contact with and (suspected) exposure to other viral communicable diseases: Secondary | ICD-10-CM | POA: Diagnosis not present

## 2019-04-10 DIAGNOSIS — R488 Other symbolic dysfunctions: Secondary | ICD-10-CM | POA: Diagnosis not present

## 2019-04-10 DIAGNOSIS — H5452A1 Low vision left eye category 1, normal vision right eye: Secondary | ICD-10-CM | POA: Diagnosis not present

## 2019-04-12 ENCOUNTER — Encounter: Payer: Self-pay | Admitting: Adult Health

## 2019-04-12 ENCOUNTER — Other Ambulatory Visit: Payer: Self-pay

## 2019-04-12 ENCOUNTER — Ambulatory Visit (INDEPENDENT_AMBULATORY_CARE_PROVIDER_SITE_OTHER): Payer: Medicare Other | Admitting: Adult Health

## 2019-04-12 VITALS — BP 150/74 | HR 59 | Temp 98.4°F | Ht 67.0 in | Wt 143.8 lb

## 2019-04-12 DIAGNOSIS — R413 Other amnesia: Secondary | ICD-10-CM

## 2019-04-12 DIAGNOSIS — Z1159 Encounter for screening for other viral diseases: Secondary | ICD-10-CM | POA: Diagnosis not present

## 2019-04-12 DIAGNOSIS — Z20828 Contact with and (suspected) exposure to other viral communicable diseases: Secondary | ICD-10-CM | POA: Diagnosis not present

## 2019-04-12 MED ORDER — MIRABEGRON ER 25 MG PO TB24: 25.0000 mg | ORAL_TABLET | Freq: Every day | ORAL | 3 refills | Status: DC

## 2019-04-12 NOTE — Progress Notes (Signed)
PATIENT: Laura Li DOB: [id number]  REASON FOR VISIT: follow up HISTORY FROM: patient  HISTORY OF PRESENT ILLNESS: Today 04/12/19:  Laura Li is an 79 year old female with a history of mild cognitive impairment.  She returns today for follow-up. She is here today with her daughter. She feels that her memory is worse. She continues to live [employer]. She completes all ADLs independently. Her daughter helps her with her finances. She eats in the dining room. She does not make any meals. She does not operate a motor vehicle. She reports that there are times that she is slightly depressed but daughter feels that this is mild. She is currently on Effexor. Daughter reports that there is a certain man that lives across the hallway from the patient and she often gets him confused. She states that there are multiple men name "Laura Li." The daughter has noticed more confusion with certain things. Patient returns today for an evaluation.   HISTORY 09/24/18:   Laura Li is a 79 year old female with a history of mild cognitive impairment.  She returns today for follow-up with her daughter.  The patient is currently not on any medication for her memory.  She feels that her memory may be slightly worse.  She continues to live at [employer].  She is able to complete all ADLs independently.  She does not cook because she eats in the dining hall.  Denies any trouble with her sleep.  Her daughter manages her finances.  She manages her own medications.  She does not operate a motor vehicle due to her vision.  She returns today for an evaluation.  REVIEW OF SYSTEMS: Out of a complete 14 system review of symptoms, the patient complains only of the following symptoms, and all other reviewed systems are negative.  See HPI  ALLERGIES: Allergies  Allergen Reactions  . Dyazide [Hydrochlorothiazide W-Triamterene]     Presumption: drug induced vasculitis  . Niacin Other (See Comments)    flushing  .  Penicillins Diarrhea and Nausea And Vomiting    HOME MEDICATIONS: Outpatient Medications Prior to Visit  Medication Sig Dispense Refill  . acetaminophen (TYLENOL) 500 MG tablet Take 500 mg by mouth 2 (two) times daily as needed for mild pain. Reported on 07/07/2015    . atorvastatin (LIPITOR) 20 MG tablet TAKE 1 TABLET(20 MG) BY MOUTH DAILY 90 tablet 0  . B Complex-C-E-Zn (B COMPLEX-C-E-ZINC) tablet Take 1 tablet by mouth daily.    . Calcium-Vitamin D (CALTRATE 600 PLUS-VIT D PO) Take 1 tablet by mouth 2 (two) times daily.     . carboxymethylcellulose (REFRESH PLUS) 0.5 % SOLN 2 drops 3 (three) times daily as needed.    . famotidine (PEPCID) 20 MG tablet Take 1 tablet (20 mg total) by mouth 2 (two) times daily. 60 tablet 5  . fexofenadine (ALLEGRA) 180 MG tablet Take 180 mg by mouth daily as needed for allergies.     [name] ipratropium (ATROVENT) 0.03 % nasal spray Place 2 sprays into the nose every 12 (twelve) hours. 180 mL 3  . Lactobacillus-Inulin (CULTURELLE DIGESTIVE HEALTH PO) Take 1 capsule by mouth daily.    [name] levothyroxine (SYNTHROID) 88 MCG tablet TAKE 1 TABLET(88 MCG) BY MOUTH DAILY 90 tablet 3  . Multiple Vitamins-Minerals (CENTRUM SILVER PO) Take 1 tablet by mouth daily.    [name] MYRBETRIQ 25 MG TB24 tablet TAKE 1 TABLET BY MOUTH DAILY 90 tablet 0  . nadolol (CORGARD) 20 MG tablet TAKE 1/2 TABLET(10 MG) BY MOUTH  DAILY 45 tablet 2  . Propylene Glycol (SYSTANE BALANCE) 0.6 % SOLN Apply 1-2 drops to eye daily as needed (dry eyes).     . venlafaxine XR (EFFEXOR-XR) 150 MG 24 hr capsule TAKE 1 CAPSULE(150 MG) BY MOUTH DAILY 90 capsule 3  . vitamin B-12 (CYANOCOBALAMIN) 100 MCG tablet Take 100 mcg by mouth daily.     Facility-Administered Medications Prior to Visit  Medication Dose Route Frequency Provider Last Rate Last Admin  . Bevacizumab (AVASTIN) SOLN 1.25 mg  1.25 mg Intravitreal  Bernarda Caffey, MD   1.25 mg at 10/10/17 1318  . Bevacizumab (AVASTIN) SOLN 1.25 mg  1.25 mg Intravitreal   Bernarda Caffey, MD   1.25 mg at 11/07/17 1206  . Bevacizumab (AVASTIN) SOLN 1.25 mg  1.25 mg Intravitreal  Bernarda Caffey, MD   1.25 mg at 12/05/17 1212  . Bevacizumab (AVASTIN) SOLN 1.25 mg  1.25 mg Intravitreal  Bernarda Caffey, MD   1.25 mg at 01/04/18 2328  . Bevacizumab (AVASTIN) SOLN 1.25 mg  1.25 mg Intravitreal  Bernarda Caffey, MD   1.25 mg at 01/30/18 1230  . Bevacizumab (AVASTIN) SOLN 1.25 mg  1.25 mg Intravitreal  Bernarda Caffey, MD   1.25 mg at 02/27/18 1315  . Bevacizumab (AVASTIN) SOLN 1.25 mg  1.25 mg Intravitreal  Bernarda Caffey, MD   1.25 mg at 03/27/18 1138  . Bevacizumab (AVASTIN) SOLN 1.25 mg  1.25 mg Intravitreal  Bernarda Caffey, MD   1.25 mg at 05/08/18 1231    PAST MEDICAL HISTORY: Past Medical History:  Diagnosis Date  . Anemia    from medicaions  . Arthritis   . Chronic kidney disease    some renal impairment from meds  . Depression    states no depression  . Eczema   . Esophageal stricture   . GERD (gastroesophageal reflux disease)   . Headache   . Heart murmur    states it is benign told 30-40 years ago  . History of DVT (deep vein thrombosis)   . HX: breast cancer    melanoma  . Hyperlipidemia   . Hypertension   . Hypothyroidism   . Melanoma ([place])   . OA (osteoarthritis)   . Personal history of chemotherapy 2016   Left Breast Cancer  . Personal history of radiation therapy 2016   Left Breast Cancer  . Pneumothorax on right 06/15/2014  . PONV (postoperative nausea and vomiting)   . Wears glasses     PAST SURGICAL HISTORY: Past Surgical History:  Procedure Laterality Date  . APPENDECTOMY  1967  . BREAST EXCISIONAL BIOPSY Left   . BREAST LUMPECTOMY Left 2016  . BREAST SURGERY  1977   removal of calcified milk gland right breast  . CHEST TUBE INSERTION  06/15/2014  . CHEST TUBE INSERTION Bilateral 06/15/2014   Procedure: CHEST TUBE INSERTION;  Surgeon: Rolm Bookbinder, MD;  Location: [facility];  Service: General;  Laterality: Bilateral;  . COLONOSCOPY     . [place]   right breast  . MASTECTOMY Right 1998   right-nodes out  . MELANOMA EXCISION  2004   right side of face  . MOHS SURGERY  2005   right left-basal cell  . PORT-A-CATH REMOVAL N/A 10/05/2014   Procedure: REMOVAL PORT-A-CATH;  Surgeon: Rolm Bookbinder, MD;  Location: [facility];  Service: General;  Laterality: N/A;  . PORTACATH PLACEMENT Left 06/15/2014   Procedure: INSERTION PORT-A-CATH;  Surgeon: Rolm Bookbinder, MD;  Location: [facility];  Service:  General;  Laterality: Left;  . RADIOACTIVE SEED GUIDED PARTIAL MASTECTOMY WITH AXILLARY SENTINEL LYMPH NODE BIOPSY Left 05/05/2014   Procedure: RADIOACTIVE SEED GUIDED LEFT LUMPECTOMY WITH AXILLARY SENTINEL LYMPH NODE BIOPSY;  Surgeon: Rolm Bookbinder, MD;  Location: [facility];  Service: General;  Laterality: Left;  . TONSILLECTOMY AND ADENOIDECTOMY  1949  . TOTAL ABDOMINAL HYSTERECTOMY  1992   including ovaries    FAMILY HISTORY: Family History  Problem Relation Age of Onset  . Heart disease Father 28       smoker  . Heart attack Father   . Endometrial cancer Mother 25  . Breast cancer Cousin        x 3 cousins with breast cancer (one at 52, one at 49 and one at 67)  . Lung cancer Paternal Aunt        smoker  . Brain cancer Maternal Aunt   . Diabetes Maternal Aunt        ???  . Breast cancer Maternal Aunt 51  . Healthy Sister   . Tuberculosis Maternal Grandfather   . Cervical cancer Paternal Grandmother   . Healthy Daughter   . Colon cancer Neg Hx   . Amblyopia Neg Hx   . Blindness Neg Hx   . Cataracts Neg Hx   . Glaucoma Neg Hx   . Macular degeneration Neg Hx   . Retinal detachment Neg Hx   . Strabismus Neg Hx   . Retinitis pigmentosa Neg Hx     SOCIAL HISTORY: Social History   Socioeconomic History  . Marital status: Widowed    Spouse name: Not on file  . Number of children: Not on file  . Years of education: Not on file  . Highest education level: Not on  file  Occupational History  . Occupation: retired  Tobacco Use  . Smoking status: Former Smoker    Packs/day: 0.75    Years: 3.00    Pack years: 2.25    Types: Cigarettes    Quit date: 03/11/1969    Years since quitting: 50.1  . Smokeless tobacco: Never Used  Substance and Sexual Activity  . Alcohol use: Yes    Alcohol/week: 0.0 standard drinks    Comment: occasional wine  . Drug use: No  . Sexual activity: Not Currently    Birth control/protection: None  Other Topics Concern  . Not on file  Social History Narrative   Widowed 2003. Moved to [place] from [place] in 2004 after loss of husband to be near daughter. 1 daughter. 2 grandkids.       Retired from working at [employer]      Hobbies: Training and development officer   Social Determinants of Radio broadcast assistant Strain:   . Difficulty of Paying Living Expenses: Not on file  Food Insecurity:   . Worried About Charity fundraiser in the Last Year: Not on file  . Ran Out of Food in the Last Year: Not on file  Transportation Needs:   . Lack of Transportation (Medical): Not on file  . Lack of Transportation (Non-Medical): Not on file  Physical Activity:   . Days of Exercise per Week: Not on file  . Minutes of Exercise per Session: Not on file  Stress:   . Feeling of Stress : Not on file  Social Connections:   . Frequency of Communication with Friends and Family: Not on file  . Frequency of Social Gatherings with Friends and Family: Not on file  . Attends  Religious Services: Not on file  . Active Member of Clubs or Organizations: Not on file  . Attends Archivist Meetings: Not on file  . Marital Status: Not on file  Intimate Partner Violence:   . Fear of Current or Ex-Partner: Not on file  . Emotionally Abused: Not on file  . Physically Abused: Not on file  . Sexually Abused: Not on file      PHYSICAL EXAM  Vitals:   04/12/19 1025  BP: (!) 150/74  Pulse: (!) 59  Temp: 98.4 F (36.9 C)  Weight: 143 lb 12.8 oz (65.2  kg)  Height: 5\' 7"  (1.702 m)   Body mass index is 22.52 kg/m.   MMSE - Mini Mental State Exam 04/12/2019 09/24/2018 10/18/2016  Not completed: - - Refused  Orientation to time 4 5 4   Orientation to Place 5 5 5   Registration 3 3 3   Attention/ Calculation 1 5 3   Attention/Calculation-comments - - serial 3 from 20; (1 serials 7 from 100)  Recall 3 2 3   Language- name 2 objects 2 2 2   Language- repeat 1 1 1   Language- follow 3 step command 3 3 2   Language- read & follow direction 1 1 1   Write a sentence 1 1 1   Copy design 1 1 1   Copy design-comments 6 animals named 9 animals -  Total score 25 29 26      Generalized: Well developed, in no acute distress   Neurological examination  Mentation: Alert oriented to time, place, history taking. Follows all commands speech and language fluent Cranial nerve II-XII: Pupils were equal round reactive to light. Extraocular movements were full, visual field were full on confrontational test. . Head turning and shoulder shrug  were normal and symmetric. Motor: The motor testing reveals 5 over 5 strength of all 4 extremities. Good symmetric motor tone is noted throughout.  Sensory: Sensory testing is intact to soft touch on all 4 extremities. No evidence of extinction is noted.  Coordination: Cerebellar testing reveals good finger-nose-finger and heel-to-shin bilaterally.  Gait and station: Gait is normal.   DIAGNOSTIC DATA (LABS, IMAGING, TESTING) - I reviewed patient records, labs, notes, testing and imaging myself where available.  Lab Results  Component Value Date   WBC 6.3 02/10/2019   HGB 13.2 02/10/2019   HCT 38.6 02/10/2019   MCV 91.7 02/10/2019   PLT 222.0 02/10/2019      Component Value Date/Time   NA 137 02/10/2019 1114   NA 141 06/28/2016 1118   K 4.6 02/10/2019 1114   K 4.4 06/28/2016 1118   CL 100 02/10/2019 1114   CO2 31 02/10/2019 1114   CO2 26 06/28/2016 1118   GLUCOSE 93 02/10/2019 1114   GLUCOSE 106 06/28/2016 1118    GLUCOSE 88 02/06/2006 1059   BUN 17 02/10/2019 1114   BUN 16.2 06/28/2016 1118   CREATININE 1.01 02/10/2019 1114   CREATININE 1.05 06/24/2017 1108   CREATININE 1.0 06/28/2016 1118   CALCIUM 10.1 02/10/2019 1114   CALCIUM 9.9 06/28/2016 1118   PROT 7.4 02/10/2019 1114   PROT 7.6 06/28/2016 1118   ALBUMIN 4.4 02/10/2019 1114   ALBUMIN 4.2 06/28/2016 1118   AST 20 02/10/2019 1114   AST 24 06/24/2017 1108   AST 21 06/28/2016 1118   ALT 15 02/10/2019 1114   ALT 21 06/24/2017 1108   ALT 17 06/28/2016 1118   ALKPHOS 72 02/10/2019 1114   ALKPHOS 66 06/28/2016 1118   BILITOT 0.5 02/10/2019 1114  BILITOT 0.4 06/24/2017 1108   BILITOT 0.47 06/28/2016 1118   GFRNONAA 44 (L) 11/26/2018 1300   GFRNONAA 50 (L) 06/24/2017 1108   GFRAA 51 (L) 11/26/2018 1300   GFRAA 57 (L) 06/24/2017 1108   Lab Results  Component Value Date   CHOL 198 02/10/2019   HDL 41.40 02/10/2019   LDLCALC 124 (H) 06/21/2013   LDLDIRECT 116.0 02/10/2019   TRIG 269.0 (H) 02/10/2019   CHOLHDL 5 02/10/2019   No results found for: HGBA1C Lab Results  Component Value Date   VITAMINB12 857 10/16/2017   Lab Results  Component Value Date   TSH 3.29 02/10/2019      ASSESSMENT AND PLAN 79 y.o. year old female  has a past medical history of Anemia, Arthritis, Chronic kidney disease, Depression, Eczema, Esophageal stricture, GERD (gastroesophageal reflux disease), Headache, Heart murmur, History of DVT (deep vein thrombosis), breast cancer, Hyperlipidemia, Hypertension, Hypothyroidism, Melanoma ([place]), OA (osteoarthritis), Personal history of chemotherapy (2016), Personal history of radiation therapy (2016), Pneumothorax on right (06/15/2014), PONV (postoperative nausea and vomiting), and Wears glasses. here with:  1. Memory disturbance  -Memory score decreased slightly MMSE 25/30 -Discussed starting Aricept or Namenda -Patient deferred on medication but if reconsiders will let us know -Continue to monitor  symptoms -Follow-up in 6 months or sooner if needed.    I spent 15 minutes with the patient. 50% of this time was spent discussing plan of care   Ward Givens, MSN, NP-C 04/12/2019, 10:32 AM [facility] [address], [place], [place] [address] [phone]

## 2019-04-12 NOTE — Patient Instructions (Signed)
Your Plan:  Continue to monitor memory Consider starting Aricept and namenda If your symptoms worsen or you develop new symptoms please let us know.   Thank you for coming to see [place] at [facility]. I hope we have been able to provide you high quality care today.  You may receive a patient satisfaction survey over the next few weeks. We would appreciate your feedback and comments so that we may continue to improve ourselves and the health of our patients.  Memantine Tablets What is this medicine? MEMANTINE (MEM an teen) is used to treat dementia caused by Alzheimer's disease. This medicine may be used for other purposes; ask your health care provider or pharmacist if you have questions. COMMON BRAND NAME(S): Namenda What should I tell my health care provider before I take this medicine? They need to know if you have any of these conditions:  difficulty passing urine  kidney disease  liver disease  seizures  an unusual or allergic reaction to memantine, other medicines, foods, dyes, or preservatives  pregnant or trying to get pregnant  breast-feeding How should I use this medicine? Take this medicine by mouth with a glass of water. Follow the directions on the prescription label. You may take this medicine with or without food. Take your doses at regular intervals. Do not take your medicine more often than directed. Continue to take your medicine even if you feel better. Do not stop taking except on the advice of your doctor or health care professional. Talk to your pediatrician regarding the use of this medicine in children. Special care may be needed. Overdosage: If you think you have taken too much of this medicine contact a poison control center or emergency room at once. NOTE: This medicine is only for you. Do not share this medicine with others. What if I miss a dose? If you miss a dose, take it as soon as you can. If it is almost time for your next dose,  take only that dose. Do not take double or extra doses. If you do not take your medicine for several days, contact your health care provider. Your dose may need to be changed. What may interact with this medicine?  acetazolamide  amantadine  cimetidine  dextromethorphan  dofetilide  hydrochlorothiazide  ketamine  metformin  methazolamide  quinidine  ranitidine  sodium bicarbonate  triamterene This list may not describe all possible interactions. Give your health care provider a list of all the medicines, herbs, non-prescription drugs, or dietary supplements you use. Also tell them if you smoke, drink alcohol, or use illegal drugs. Some items may interact with your medicine. What should I watch for while using this medicine? Visit your doctor or health care professional for regular checks on your progress. Check with your doctor or health care professional if there is no improvement in your symptoms or if they get worse. You may get drowsy or dizzy. Do not drive, use machinery, or do anything that needs mental alertness until you know how this drug affects you. Do not stand or sit up quickly, especially if you are an older patient. This reduces the risk of dizzy or fainting spells. Alcohol can make you more drowsy and dizzy. Avoid alcoholic drinks. What side effects may I notice from receiving this medicine? Side effects that you should report to your doctor or health care professional as soon as possible:  allergic reactions like skin rash, itching or hives, swelling of the face, lips, or tongue  agitation  or a feeling of restlessness  depressed mood  dizziness  hallucinations  redness, blistering, peeling or loosening of the skin, including inside the mouth  seizures  vomiting Side effects that usually do not require medical attention (report to your doctor or health care professional if they continue or are  bothersome):  constipation  diarrhea  headache  nausea  trouble sleeping This list may not describe all possible side effects. Call your doctor for medical advice about side effects. You may report side effects to FDA at 1-800-FDA-1088. Where should I keep my medicine? Keep out of the reach of children. Store at room temperature between 15 degrees and 30 degrees C (59 degrees and 86 degrees F). Throw away any unused medicine after the expiration date. NOTE: This sheet is a summary. It may not cover all possible information. If you have questions about this medicine, talk to your doctor, pharmacist, or health care provider.  2020 Elsevier/Gold Standard (2012-12-14 14:10:42) Donepezil tablets What is this medicine? DONEPEZIL (doe NEP e zil) is used to treat mild to moderate dementia caused by Alzheimer's disease. This medicine may be used for other purposes; ask your health care provider or pharmacist if you have questions. COMMON BRAND NAME(S): Aricept What should I tell my health care provider before I take this medicine? They need to know if you have any of these conditions:  asthma or other lung disease  difficulty passing urine  head injury  heart disease  history of irregular heartbeat  liver disease  seizures (convulsions)  stomach or intestinal disease, ulcers or stomach bleeding  an unusual or allergic reaction to donepezil, other medicines, foods, dyes, or preservatives  pregnant or trying to get pregnant  breast-feeding How should I use this medicine? Take this medicine by mouth with a glass of water. Follow the directions on the prescription label. You may take this medicine with or without food. Take this medicine at regular intervals. This medicine is usually taken before bedtime. Do not take it more often than directed. Continue to take your medicine even if you feel better. Do not stop taking except on your doctor's advice. If you are taking the 23 mg  donepezil tablet, swallow it whole; do not cut, crush, or chew it. Talk to your pediatrician regarding the use of this medicine in children. Special care may be needed. Overdosage: If you think you have taken too much of this medicine contact a poison control center or emergency room at once. NOTE: This medicine is only for you. Do not share this medicine with others. What if I miss a dose? If you miss a dose, take it as soon as you can. If it is almost time for your next dose, take only that dose, do not take double or extra doses. What may interact with this medicine? Do not take this medicine with any of the following medications:  certain medicines for fungal infections like itraconazole, fluconazole, posaconazole, and voriconazole  cisapride  dextromethorphan; quinidine  dronedarone  pimozide  quinidine  thioridazine This medicine may also interact with the following medications:  antihistamines for allergy, cough and cold  atropine  bethanechol  carbamazepine  certain medicines for bladder problems like oxybutynin, tolterodine  certain medicines for Parkinson's disease like benztropine, trihexyphenidyl  certain medicines for stomach problems like dicyclomine, hyoscyamine  certain medicines for travel sickness like scopolamine  dexamethasone  dofetilide  ipratropium  NSAIDs, medicines for pain and inflammation, like ibuprofen or naproxen  other medicines for Alzheimer's disease  other medicines that prolong the QT interval (cause an abnormal heart rhythm)  phenobarbital  phenytoin  rifampin, rifabutin or rifapentine  ziprasidone This list may not describe all possible interactions. Give your health care provider a list of all the medicines, herbs, non-prescription drugs, or dietary supplements you use. Also tell them if you smoke, drink alcohol, or use illegal drugs. Some items may interact with your medicine. What should I watch for while using this  medicine? Visit your doctor or health care professional for regular checks on your progress. Check with your doctor or health care professional if your symptoms do not get better or if they get worse. You may get drowsy or dizzy. Do not drive, use machinery, or do anything that needs mental alertness until you know how this drug affects you. What side effects may I notice from receiving this medicine? Side effects that you should report to your doctor or health care professional as soon as possible:  allergic reactions like skin rash, itching or hives, swelling of the face, lips, or tongue  feeling faint or lightheaded, falls  loss of bladder control  seizures  signs and symptoms of a dangerous change in heartbeat or heart rhythm like chest pain; dizziness; fast or irregular heartbeat; palpitations; feeling faint or lightheaded, falls; breathing problems  signs and symptoms of infection like fever or chills; cough; sore throat; pain or trouble passing urine  signs and symptoms of liver injury like dark yellow or brown urine; general ill feeling or flu-like symptoms; light-colored stools; loss of appetite; nausea; right upper belly pain; unusually weak or tired; yellowing of the eyes or skin  slow heartbeat or palpitations  unusual bleeding or bruising  vomiting Side effects that usually do not require medical attention (report to your doctor or health care professional if they continue or are bothersome):  diarrhea, especially when starting treatment  headache  loss of appetite  muscle cramps  nausea  stomach upset This list may not describe all possible side effects. Call your doctor for medical advice about side effects. You may report side effects to FDA at 1-800-FDA-1088. Where should I keep my medicine? Keep out of reach of children. Store at room temperature between 15 and 30 degrees C (59 and 86 degrees F). Throw away any unused medicine after the expiration  date. NOTE: This sheet is a summary. It may not cover all possible information. If you have questions about this medicine, talk to your doctor, pharmacist, or health care provider.  2020 Elsevier/Gold Standard (2018-02-16 10:33:41)

## 2019-04-14 DIAGNOSIS — R488 Other symbolic dysfunctions: Secondary | ICD-10-CM | POA: Diagnosis not present

## 2019-04-14 DIAGNOSIS — H5452A1 Low vision left eye category 1, normal vision right eye: Secondary | ICD-10-CM | POA: Diagnosis not present

## 2019-04-15 DIAGNOSIS — R488 Other symbolic dysfunctions: Secondary | ICD-10-CM | POA: Diagnosis not present

## 2019-04-15 DIAGNOSIS — H5452A1 Low vision left eye category 1, normal vision right eye: Secondary | ICD-10-CM | POA: Diagnosis not present

## 2019-04-19 DIAGNOSIS — R488 Other symbolic dysfunctions: Secondary | ICD-10-CM | POA: Diagnosis not present

## 2019-04-19 DIAGNOSIS — Z20828 Contact with and (suspected) exposure to other viral communicable diseases: Secondary | ICD-10-CM | POA: Diagnosis not present

## 2019-04-19 DIAGNOSIS — H5452A1 Low vision left eye category 1, normal vision right eye: Secondary | ICD-10-CM | POA: Diagnosis not present

## 2019-04-19 DIAGNOSIS — Z1159 Encounter for screening for other viral diseases: Secondary | ICD-10-CM | POA: Diagnosis not present

## 2019-04-22 ENCOUNTER — Other Ambulatory Visit: Payer: Self-pay

## 2019-04-22 ENCOUNTER — Ambulatory Visit
Admission: RE | Admit: 2019-04-22 | Discharge: 2019-04-22 | Disposition: A | Discharge status: Home / Self Care | Payer: Medicare Other | Source: Ambulatory Visit | Attending: Family Medicine | Admitting: Family Medicine

## 2019-04-22 DIAGNOSIS — R922 Inconclusive mammogram: Secondary | ICD-10-CM | POA: Diagnosis not present

## 2019-04-22 DIAGNOSIS — Z20828 Contact with and (suspected) exposure to other viral communicable diseases: Secondary | ICD-10-CM | POA: Diagnosis not present

## 2019-04-22 DIAGNOSIS — Z1159 Encounter for screening for other viral diseases: Secondary | ICD-10-CM | POA: Diagnosis not present

## 2019-04-22 DIAGNOSIS — Z853 Personal history of malignant neoplasm of breast: Secondary | ICD-10-CM | POA: Diagnosis not present

## 2019-04-22 DIAGNOSIS — Z9011 Acquired absence of right breast and nipple: Secondary | ICD-10-CM | POA: Diagnosis not present

## 2019-04-23 ENCOUNTER — Ambulatory Visit: Payer: Medicare Other | Admitting: Family Medicine

## 2019-04-23 DIAGNOSIS — R488 Other symbolic dysfunctions: Secondary | ICD-10-CM | POA: Diagnosis not present

## 2019-04-23 DIAGNOSIS — H5452A1 Low vision left eye category 1, normal vision right eye: Secondary | ICD-10-CM | POA: Diagnosis not present

## 2019-04-26 DIAGNOSIS — Z20828 Contact with and (suspected) exposure to other viral communicable diseases: Secondary | ICD-10-CM | POA: Diagnosis not present

## 2019-04-26 DIAGNOSIS — Z1159 Encounter for screening for other viral diseases: Secondary | ICD-10-CM | POA: Diagnosis not present

## 2019-04-26 DIAGNOSIS — H5452A1 Low vision left eye category 1, normal vision right eye: Secondary | ICD-10-CM | POA: Diagnosis not present

## 2019-04-26 DIAGNOSIS — R488 Other symbolic dysfunctions: Secondary | ICD-10-CM | POA: Diagnosis not present

## 2019-04-29 DIAGNOSIS — Z23 Encounter for immunization: Secondary | ICD-10-CM | POA: Diagnosis not present

## 2019-05-04 ENCOUNTER — Encounter: Payer: Self-pay | Admitting: Family Medicine

## 2019-05-04 DIAGNOSIS — R413 Other amnesia: Secondary | ICD-10-CM

## 2019-05-04 DIAGNOSIS — Z853 Personal history of malignant neoplasm of breast: Secondary | ICD-10-CM

## 2019-05-05 ENCOUNTER — Encounter: Payer: Self-pay | Admitting: Adult Health

## 2019-05-05 DIAGNOSIS — H5452A1 Low vision left eye category 1, normal vision right eye: Secondary | ICD-10-CM | POA: Diagnosis not present

## 2019-05-05 DIAGNOSIS — R488 Other symbolic dysfunctions: Secondary | ICD-10-CM | POA: Diagnosis not present

## 2019-05-06 ENCOUNTER — Other Ambulatory Visit: Payer: Self-pay | Admitting: Family Medicine

## 2019-05-06 DIAGNOSIS — R413 Other amnesia: Secondary | ICD-10-CM

## 2019-05-06 DIAGNOSIS — Z853 Personal history of malignant neoplasm of breast: Secondary | ICD-10-CM

## 2019-05-06 NOTE — Telephone Encounter (Signed)
Looks like her PCP ordered MRI yesterday  I would suggest that she consider medication.

## 2019-05-10 DIAGNOSIS — Z1159 Encounter for screening for other viral diseases: Secondary | ICD-10-CM | POA: Diagnosis not present

## 2019-05-10 DIAGNOSIS — Z20828 Contact with and (suspected) exposure to other viral communicable diseases: Secondary | ICD-10-CM | POA: Diagnosis not present

## 2019-05-12 DIAGNOSIS — R488 Other symbolic dysfunctions: Secondary | ICD-10-CM | POA: Diagnosis not present

## 2019-05-12 DIAGNOSIS — H5452A1 Low vision left eye category 1, normal vision right eye: Secondary | ICD-10-CM | POA: Diagnosis not present

## 2019-05-17 DIAGNOSIS — Z1159 Encounter for screening for other viral diseases: Secondary | ICD-10-CM | POA: Diagnosis not present

## 2019-05-17 DIAGNOSIS — Z20828 Contact with and (suspected) exposure to other viral communicable diseases: Secondary | ICD-10-CM | POA: Diagnosis not present

## 2019-05-19 DIAGNOSIS — Z85820 Personal history of malignant melanoma of skin: Secondary | ICD-10-CM | POA: Diagnosis not present

## 2019-05-19 DIAGNOSIS — L821 Other seborrheic keratosis: Secondary | ICD-10-CM | POA: Diagnosis not present

## 2019-05-19 DIAGNOSIS — H5452A1 Low vision left eye category 1, normal vision right eye: Secondary | ICD-10-CM | POA: Diagnosis not present

## 2019-05-19 DIAGNOSIS — R488 Other symbolic dysfunctions: Secondary | ICD-10-CM | POA: Diagnosis not present

## 2019-05-19 DIAGNOSIS — L820 Inflamed seborrheic keratosis: Secondary | ICD-10-CM | POA: Diagnosis not present

## 2019-05-24 DIAGNOSIS — Z20828 Contact with and (suspected) exposure to other viral communicable diseases: Secondary | ICD-10-CM | POA: Diagnosis not present

## 2019-05-24 DIAGNOSIS — Z1159 Encounter for screening for other viral diseases: Secondary | ICD-10-CM | POA: Diagnosis not present

## 2019-05-25 ENCOUNTER — Other Ambulatory Visit: Payer: Self-pay | Admitting: Family Medicine

## 2019-05-31 DIAGNOSIS — Z1159 Encounter for screening for other viral diseases: Secondary | ICD-10-CM | POA: Diagnosis not present

## 2019-05-31 DIAGNOSIS — Z20828 Contact with and (suspected) exposure to other viral communicable diseases: Secondary | ICD-10-CM | POA: Diagnosis not present

## 2019-06-07 DIAGNOSIS — Z20828 Contact with and (suspected) exposure to other viral communicable diseases: Secondary | ICD-10-CM | POA: Diagnosis not present

## 2019-06-07 DIAGNOSIS — Z1159 Encounter for screening for other viral diseases: Secondary | ICD-10-CM | POA: Diagnosis not present

## 2019-06-14 DIAGNOSIS — Z1159 Encounter for screening for other viral diseases: Secondary | ICD-10-CM | POA: Diagnosis not present

## 2019-06-14 DIAGNOSIS — Z20828 Contact with and (suspected) exposure to other viral communicable diseases: Secondary | ICD-10-CM | POA: Diagnosis not present

## 2019-06-21 DIAGNOSIS — Z20828 Contact with and (suspected) exposure to other viral communicable diseases: Secondary | ICD-10-CM | POA: Diagnosis not present

## 2019-06-21 DIAGNOSIS — Z1159 Encounter for screening for other viral diseases: Secondary | ICD-10-CM | POA: Diagnosis not present

## 2019-06-28 DIAGNOSIS — Z1159 Encounter for screening for other viral diseases: Secondary | ICD-10-CM | POA: Diagnosis not present

## 2019-06-28 DIAGNOSIS — Z20828 Contact with and (suspected) exposure to other viral communicable diseases: Secondary | ICD-10-CM | POA: Diagnosis not present

## 2019-07-05 DIAGNOSIS — Z1159 Encounter for screening for other viral diseases: Secondary | ICD-10-CM | POA: Diagnosis not present

## 2019-07-05 DIAGNOSIS — Z20828 Contact with and (suspected) exposure to other viral communicable diseases: Secondary | ICD-10-CM | POA: Diagnosis not present

## 2019-07-06 ENCOUNTER — Encounter: Payer: Self-pay | Admitting: Family Medicine

## 2019-07-06 ENCOUNTER — Ambulatory Visit (INDEPENDENT_AMBULATORY_CARE_PROVIDER_SITE_OTHER): Payer: Medicare Other | Admitting: Family Medicine

## 2019-07-06 ENCOUNTER — Other Ambulatory Visit: Payer: Self-pay

## 2019-07-06 VITALS — BP 116/62 | HR 75 | Temp 97.6°F | Wt 147.2 lb

## 2019-07-06 DIAGNOSIS — R350 Frequency of micturition: Secondary | ICD-10-CM | POA: Diagnosis not present

## 2019-07-06 DIAGNOSIS — R3915 Urgency of urination: Secondary | ICD-10-CM | POA: Diagnosis not present

## 2019-07-06 LAB — POCT URINALYSIS DIPSTICK
Bilirubin, UA: NEGATIVE
Blood, UA: NEGATIVE
Glucose, UA: NEGATIVE
Ketones, UA: NEGATIVE
Leukocytes, UA: NEGATIVE
Nitrite, UA: NEGATIVE
Protein, UA: NEGATIVE
Spec Grav, UA: 1.02 (ref 1.010–1.025)
Urobilinogen, UA: 0.2 E.U./dL
pH, UA: 5.5 (ref 5.0–8.0)

## 2019-07-06 NOTE — Patient Instructions (Signed)
Try gradually scale back caffeine use (eg tea and coffee)  Continue with the Myrbetriq daily.

## 2019-07-06 NOTE — Progress Notes (Signed)
Subjective:     Patient ID: Laura Li, female   DOB: 04/05/1940, 79 y.o.   MRN: [id number]  HPI Laura Li is seen company by daughter with concern for possible UTI.  She apparently for the past 10 days has had some intermittent urine frequency.  Denies any burning with urination.  She does have some mild cognitive impairment and they have not noticed any major deviation from her baseline.  No new medications.  No diuretics.  No fevers or chills.  No flank pain.  She does take Myrbetriq for urinary urgency.  Denies any stress incontinence.  Only occasional nocturia.  No recent bladder instrumentation.  She states she drinks 1 cup of coffee in the morning and usually at least 2 glasses of caffeinated tea per day.  She lives at [employer] and her meals are prepared.  Past Medical History:  Diagnosis Date  . Anemia    from medicaions  . Arthritis   . Chronic kidney disease    some renal impairment from meds  . Depression    states no depression  . Eczema   . Esophageal stricture   . GERD (gastroesophageal reflux disease)   . Headache   . Heart murmur    states it is benign told 30-40 years ago  . History of DVT (deep vein thrombosis)   . HX: breast cancer    melanoma  . Hyperlipidemia   . Hypertension   . Hypothyroidism   . Melanoma ([place])   . OA (osteoarthritis)   . Personal history of chemotherapy 2016   Left Breast Cancer  . Personal history of radiation therapy 2016   Left Breast Cancer  . Pneumothorax on right 06/15/2014  . PONV (postoperative nausea and vomiting)   . Wears glasses    Past Surgical History:  Procedure Laterality Date  . APPENDECTOMY  1967  . BREAST EXCISIONAL BIOPSY Left   . BREAST LUMPECTOMY Left 2016  . BREAST SURGERY  1977   removal of calcified milk gland right breast  . CHEST TUBE INSERTION  06/15/2014  . CHEST TUBE INSERTION Bilateral 06/15/2014   Procedure: CHEST TUBE INSERTION;  Surgeon: Rolm Bookbinder, MD;  Location: [place];   Service: General;  Laterality: Bilateral;  . COLONOSCOPY    . [place]   right breast  . MASTECTOMY Right 1998   right-nodes out  . MELANOMA EXCISION  2004   right side of face  . MOHS SURGERY  2005   right left-basal cell  . PORT-A-CATH REMOVAL N/A 10/05/2014   Procedure: REMOVAL PORT-A-CATH;  Surgeon: Rolm Bookbinder, MD;  Location: [place];  Service: General;  Laterality: N/A;  . PORTACATH PLACEMENT Left 06/15/2014   Procedure: INSERTION PORT-A-CATH;  Surgeon: Rolm Bookbinder, MD;  Location: [place];  Service: General;  Laterality: Left;  . RADIOACTIVE SEED GUIDED PARTIAL MASTECTOMY WITH AXILLARY SENTINEL LYMPH NODE BIOPSY Left 05/05/2014   Procedure: RADIOACTIVE SEED GUIDED LEFT LUMPECTOMY WITH AXILLARY SENTINEL LYMPH NODE BIOPSY;  Surgeon: Rolm Bookbinder, MD;  Location: [place];  Service: General;  Laterality: Left;  . TONSILLECTOMY AND ADENOIDECTOMY  1949  . TOTAL ABDOMINAL HYSTERECTOMY  1992   including ovaries    reports that she quit smoking about 50 years ago. Her smoking use included cigarettes. She has a 2.25 pack-year smoking history. She has never used smokeless tobacco. She reports current alcohol use. She reports that she does not use drugs. family history includes Brain cancer in her maternal  aunt; Breast cancer in her cousin; Breast cancer (age of onset: 49) in her maternal aunt; Cervical cancer in her paternal grandmother; Diabetes in her maternal aunt; Endometrial cancer (age of onset: 61) in her mother; Healthy in her daughter and sister; Heart attack in her father; Heart disease (age of onset: 65) in her father; Lung cancer in her paternal aunt; Tuberculosis in her maternal grandfather. Allergies  Allergen Reactions  . Dyazide [Hydrochlorothiazide W-Triamterene]     Presumption: drug induced vasculitis  . Niacin Other (See Comments)    flushing  . Penicillins Diarrhea and Nausea And Vomiting     Review of Systems   Constitutional: Negative for chills and fever.  Gastrointestinal: Negative for abdominal pain, nausea and vomiting.  Genitourinary: Positive for frequency and urgency. Negative for difficulty urinating, flank pain and hematuria.       Objective:   Physical Exam Vitals reviewed.  Constitutional:      Appearance: Normal appearance.  Cardiovascular:     Rate and Rhythm: Normal rate and regular rhythm.  Pulmonary:     Effort: Pulmonary effort is normal.     Breath sounds: Normal breath sounds.  Neurological:     Mental Status: She is alert.        Assessment:     Urine urgency and frequency.  Urine dipstick is unremarkable.  No evidence for infection.  She does drink a fair amount of caffeine as above which is likely contributing.    Plan:     -Continue with Myrbetriq  -We recommend she try to gradually scale back her caffeine use by eliminating caffeinated tea during the day  -Would try to avoid anticholinergics as much as possible  -Follow-up with Dr. Yong Li for any persistent or worsening symptoms  Laura Post MD [facility]

## 2019-07-12 DIAGNOSIS — Z1159 Encounter for screening for other viral diseases: Secondary | ICD-10-CM | POA: Diagnosis not present

## 2019-07-12 DIAGNOSIS — Z20828 Contact with and (suspected) exposure to other viral communicable diseases: Secondary | ICD-10-CM | POA: Diagnosis not present

## 2019-07-19 DIAGNOSIS — Z1159 Encounter for screening for other viral diseases: Secondary | ICD-10-CM | POA: Diagnosis not present

## 2019-07-19 DIAGNOSIS — Z20828 Contact with and (suspected) exposure to other viral communicable diseases: Secondary | ICD-10-CM | POA: Diagnosis not present

## 2019-07-26 DIAGNOSIS — Z20828 Contact with and (suspected) exposure to other viral communicable diseases: Secondary | ICD-10-CM | POA: Diagnosis not present

## 2019-07-26 DIAGNOSIS — Z1159 Encounter for screening for other viral diseases: Secondary | ICD-10-CM | POA: Diagnosis not present

## 2019-08-02 DIAGNOSIS — Z1159 Encounter for screening for other viral diseases: Secondary | ICD-10-CM | POA: Diagnosis not present

## 2019-08-02 DIAGNOSIS — Z20828 Contact with and (suspected) exposure to other viral communicable diseases: Secondary | ICD-10-CM | POA: Diagnosis not present

## 2019-08-03 NOTE — Patient Instructions (Addendum)
Health Maintenance Due  Topic Date Due  . COVID-19 Vaccine -will send 2nd covid shot date through [place] 04/29/2019   Overactive bladder not well controlled- try mybetriq 50 mg instead of 25 mg for 1 to 2 weeks and if no improvement can place a referral to urology-she will call or send [place] a MyChart message  Occasionally having some issues with reflux despite Pepcid twice a day-recommended trial of Tums-ideally we would avoid restarting PPI like omeprazole due to potential to affect kidneys and   Due to being off of Xarelto-that increases the importance to me of getting the MRI to make sure there are not strokes on the brain-we are going to try to get the prior MRI set up. We will call you within two weeks about your referral for MRI. If you do not hear within 3 weeks, give [place] a call.     Please stop by lab before you go If you have mychart- we will send your results within 3 business days of [place] receiving them.  If you do not have mychart- we will call you about results within 5 business days of [place] receiving them.

## 2019-08-03 NOTE — Progress Notes (Signed)
Phone [phone] In person visit   Subjective:   Laura Li is a 79 y.o. year old very pleasant female patient who presents for/with See problem oriented charting   This visit occurred during the SARS-CoV-2 public health emergency.  Safety protocols were in place, including screening questions prior to the visit, additional usage of staff PPE, and extensive cleaning of exam room while observing appropriate contact time as indicated for disinfecting solutions.   Past Medical History-  Patient Active Problem List   Diagnosis Date Noted  . MCI (mild cognitive impairment) 10/16/2016    Priority: High  . Paroxysmal atrial fibrillation ([place]) 06/04/2016    Priority: High  . Chronic deep vein thrombosis (DVT) ([facility]) 09/01/2015    Priority: High  . Pneumothorax 06/15/2014    Priority: High  . History of left breast cancer 04/24/2014    Priority: High  . History of right breast cancer 04/24/2014    Priority: High  . Vasculitis ([place]) 11/09/2012    Priority: High  . Depression, major, single episode 04/24/2017    Priority: Medium  . Overactive bladder 09/19/2016    Priority: Medium  . Acute lower UTI 05/29/2016    Priority: Medium  . Osteoporosis 05/23/2016    Priority: Medium  . Chronic kidney disease, stage III (moderate) 12/09/2012    Priority: Medium  . 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    Priority: Medium  . Hypothyroidism 02/06/2006    Priority: Medium  . Hyperlipidemia 02/06/2006    Priority: Medium  . Essential hypertension 02/06/2006    Priority: Medium  . Localized swelling, mass, or lump of lower extremity 11/05/2014    Priority: Low  . Diverticulitis 08/09/2014    Priority: Low  . Family history of breast cancer 04/29/2014    Priority: Low  . GERD (gastroesophageal reflux disease) 03/31/2014    Priority: Low  . Hot flashes 03/31/2014    Priority: Low  . Osteoarthritis 03/31/2014    Priority: Low  . Allergic rhinitis 02/06/2006    Priority:  Low  . Disorder of bone and cartilage 05/23/2017    Medications- reviewed and updated Current Outpatient Medications  Medication Sig Dispense Refill  . acetaminophen (TYLENOL) 500 MG tablet Take 500 mg by mouth 2 (two) times daily as needed for mild pain. Reported on 07/07/2015    . atorvastatin (LIPITOR) 20 MG tablet TAKE 1 TABLET(20 MG) BY MOUTH DAILY 90 tablet 0  . B Complex-C-E-Zn (B COMPLEX-C-E-ZINC) tablet Take 1 tablet by mouth daily.    . Calcium-Vitamin D (CALTRATE 600 PLUS-VIT D PO) Take 1 tablet by mouth 2 (two) times daily.     . carboxymethylcellulose (REFRESH PLUS) 0.5 % SOLN 2 drops 3 (three) times daily as needed.    . famotidine (PEPCID) 20 MG tablet Take 1 tablet (20 mg total) by mouth 2 (two) times daily. 60 tablet 5  . fexofenadine (ALLEGRA) 180 MG tablet Take 180 mg by mouth daily as needed for allergies.     [name] ipratropium (ATROVENT) 0.03 % nasal spray Place 2 sprays into the nose every 12 (twelve) hours. 180 mL 3  . Lactobacillus-Inulin (CULTURELLE DIGESTIVE HEALTH PO) Take 1 capsule by mouth daily.    [name] levothyroxine (SYNTHROID) 88 MCG tablet TAKE 1 TABLET(88 MCG) BY MOUTH DAILY 90 tablet 3  . mirabegron ER (MYRBETRIQ) 50 MG TB24 tablet Take 1 tablet (50 mg total) by mouth daily. 30 tablet 5  . Multiple Vitamins-Minerals (CENTRUM SILVER PO) Take 1 tablet by mouth daily.    [name]  nadolol (CORGARD) 20 MG tablet TAKE 1/2 TABLET(10 MG) BY MOUTH DAILY 45 tablet 2  . Propylene Glycol (SYSTANE BALANCE) 0.6 % SOLN Apply 1-2 drops to eye daily as needed (dry eyes).     . venlafaxine XR (EFFEXOR-XR) 150 MG 24 hr capsule TAKE 1 CAPSULE(150 MG) BY MOUTH DAILY 90 capsule 3  . vitamin B-12 (CYANOCOBALAMIN) 100 MCG tablet Take 100 mcg by mouth daily.     Current Facility-Administered Medications  Medication Dose Route Frequency Provider Last Rate Last Admin  . Bevacizumab (AVASTIN) SOLN 1.25 mg  1.25 mg Intravitreal  Bernarda Caffey, MD   1.25 mg at 10/10/17 1318  . Bevacizumab (AVASTIN)  SOLN 1.25 mg  1.25 mg Intravitreal  Bernarda Caffey, MD   1.25 mg at 11/07/17 1206  . Bevacizumab (AVASTIN) SOLN 1.25 mg  1.25 mg Intravitreal  Bernarda Caffey, MD   1.25 mg at 12/05/17 1212  . Bevacizumab (AVASTIN) SOLN 1.25 mg  1.25 mg Intravitreal  Bernarda Caffey, MD   1.25 mg at 01/04/18 2328  . Bevacizumab (AVASTIN) SOLN 1.25 mg  1.25 mg Intravitreal  Bernarda Caffey, MD   1.25 mg at 01/30/18 1230  . Bevacizumab (AVASTIN) SOLN 1.25 mg  1.25 mg Intravitreal  Bernarda Caffey, MD   1.25 mg at 02/27/18 1315  . Bevacizumab (AVASTIN) SOLN 1.25 mg  1.25 mg Intravitreal  Bernarda Caffey, MD   1.25 mg at 03/27/18 1138  . Bevacizumab (AVASTIN) SOLN 1.25 mg  1.25 mg Intravitreal  Bernarda Caffey, MD   1.25 mg at 05/08/18 1231     Objective:  BP 120/80   Pulse 71   Temp (!) 97.5 F (36.4 C)   Ht 5\' 7"  (1.702 m)   Wt 147 lb (66.7 kg)   SpO2 96%   BMI 23.02 kg/m  Gen: NAD, resting comfortably    Assessment and Plan  Bladder Control/overactive bladder S:pt c/o worsening bladder control. Worse over last month or two. More issues with incontinence. Dr. Elease Hashimoto saw her on 07/06/19 and UA normal. She did not cut down on caffeine A/P:  Overactive bladder not well controlled- try mybetriq 50 mg instead of 25 mg for 1 to 2 weeks and if no improvement can place a referral to urology-she will call or send [place] a MyChart message  # Atrial Fibrillation S: Rate Controlled with Nadolol 10mg  Anticoagulated with Xarelto Previously- She Stopped Xarelto after Subretinal Hemorrhage and Has Decided so Far Not to Restart Chadsvasc Score of 4 A/P: Patient Is Appropriately Rate Controlled.  She Prefers to Remain on Xarelto Due To History of Subretinal Hemorrhage"on the Other Hand She Does Agree If She Has a History of Stroke Particularly in Relation to Worsening Memory over the Last Year That She Would Consider Restarting Xarelto so We Opted to Get Our Team to Follow through with MRI  # Depression S: Medication: Venlafaxine XR  150MG  Depression screen [facility] 2/9 08/11/2019 02/10/2019 12/15/2018  Decreased Interest 0 0 0  Down, Depressed, Hopeless 0 0 0  PHQ - 2 Score 0 0 0  Altered sleeping 0 0 -  Tired, decreased energy 2 0 -  Change in appetite 0 1 -  Feeling bad or failure about yourself  0 0 -  Trouble concentrating 0 1 -  Moving slowly or fidgety/restless 0 0 -  Suicidal thoughts 0 0 -  PHQ-9 Score 2 2 -  Difficult doing work/chores Not difficult at all Not difficult at all -  Some recent data might be hidden  A/P:  Full remission/reasonable control- continue current meds  #hyperlipidemia S: Medication:Atorvastatin 20Mg  Lab Results  Component Value Date   CHOL 198 02/10/2019   HDL 41.40 02/10/2019   LDLCALC 124 (H) 06/21/2013   LDLDIRECT 116.0 02/10/2019   TRIG 269.0 (H) 02/10/2019   CHOLHDL 5 02/10/2019   A/P: Mild poor control of cholesterol with bad cholesterol at 116 and ideal goal being under 70.  With that being said being age 89 I do not feel strongly about increasing dose of medicine for primary prevention.  We also discussed possibly changing to rosuvastatin as may have a better profile in regards to memory loss but in the end we opted to continue current medications -If she does have history of stroke on MRI and obviously this would change her goals  #hypothyroidism S: compliant On thyroid medication-Levothyroxine 88MCG Lab Results  Component Value Date   TSH 3.29 02/10/2019   A/P: Hopefully controlled-update TSH today  # GERD S:Pepcid 20Mg  twice a day. Occasional breakthrough A/P: Occasionally having some issues with reflux despite Pepcid twice a day-recommended trial of Tums-ideally we would avoid restarting PPI like omeprazole due to potential to affect kidneys and memory  #hypertension S: medication: Nadolol 20MG , BP Readings from Last 3 Encounters:  08/11/19 120/80  07/06/19 116/62  04/12/19 (!) 150/74  A/P: Well-controlled-continue current medication  #Chronic kidney disease  stage III S: GFR is typically in the 50s range- at times 60s -Patient knows to avoid NSAIDs  A/P: Hopefully stable-update BMP/CMP today  Recommended follow up: Return in about 6 months (around 02/10/2020) for as needed for new, worsening, persistent symptoms. Future Appointments  Date Time Provider [place]  10/04/2019  9:00 AM Bernarda Caffey, MD [facility] None  10/18/2019 10:30 AM Ward Givens, NP [facility] None  11/25/2019 11:30 AM [facility] 1 [facility] None  11/25/2019 12:00 PM Magrinat, Virgie Dad, MD [facility] None    Lab/Order associations:   ICD-10-CM   1. Paroxysmal atrial fibrillation (HCC)  I48.0   2. Essential hypertension  I10 Comprehensive metabolic panel  3. Hypothyroidism, unspecified type  E03.9 TSH  4. Hyperlipidemia, unspecified hyperlipidemia type  E78.5 Comprehensive metabolic panel  5. Major depressive disorder with single episode, in full remission ([place])  F32.5   6. Gastroesophageal reflux disease without esophagitis  K21.9     Meds ordered this encounter  Medications  . mirabegron ER (MYRBETRIQ) 50 MG TB24 tablet    Sig: Take 1 tablet (50 mg total) by mouth daily.    Dispense:  30 tablet    Refill:  5    Return precautions advised.  [name], MD

## 2019-08-05 ENCOUNTER — Other Ambulatory Visit: Payer: Self-pay

## 2019-08-05 MED ORDER — VENLAFAXINE HCL ER 150 MG PO CP24: ORAL_CAPSULE | ORAL | 3 refills | Status: DC

## 2019-08-09 DIAGNOSIS — Z1159 Encounter for screening for other viral diseases: Secondary | ICD-10-CM | POA: Diagnosis not present

## 2019-08-09 DIAGNOSIS — Z20828 Contact with and (suspected) exposure to other viral communicable diseases: Secondary | ICD-10-CM | POA: Diagnosis not present

## 2019-08-11 ENCOUNTER — Other Ambulatory Visit: Payer: Self-pay

## 2019-08-11 ENCOUNTER — Ambulatory Visit (INDEPENDENT_AMBULATORY_CARE_PROVIDER_SITE_OTHER): Payer: Medicare Other | Admitting: Family Medicine

## 2019-08-11 ENCOUNTER — Encounter: Payer: Self-pay | Admitting: Family Medicine

## 2019-08-11 VITALS — BP 120/80 | HR 71 | Temp 97.5°F | Ht 67.0 in | Wt 147.0 lb

## 2019-08-11 DIAGNOSIS — K219 Gastro-esophageal reflux disease without esophagitis: Secondary | ICD-10-CM | POA: Diagnosis not present

## 2019-08-11 DIAGNOSIS — I10 Essential (primary) hypertension: Secondary | ICD-10-CM

## 2019-08-11 DIAGNOSIS — F325 Major depressive disorder, single episode, in full remission: Secondary | ICD-10-CM | POA: Diagnosis not present

## 2019-08-11 DIAGNOSIS — N183 Chronic kidney disease, stage 3 unspecified: Secondary | ICD-10-CM | POA: Diagnosis not present

## 2019-08-11 DIAGNOSIS — E785 Hyperlipidemia, unspecified: Secondary | ICD-10-CM

## 2019-08-11 DIAGNOSIS — I480 Paroxysmal atrial fibrillation: Secondary | ICD-10-CM | POA: Diagnosis not present

## 2019-08-11 DIAGNOSIS — E039 Hypothyroidism, unspecified: Secondary | ICD-10-CM

## 2019-08-11 LAB — COMPREHENSIVE METABOLIC PANEL
ALT: 15 U/L (ref 0–35)
AST: 19 U/L (ref 0–37)
Albumin: 4.7 g/dL (ref 3.5–5.2)
Alkaline Phosphatase: 81 U/L (ref 39–117)
BUN: 21 mg/dL (ref 6–23)
CO2: 32 mEq/L (ref 19–32)
Calcium: 11.1 mg/dL — ABNORMAL HIGH (ref 8.4–10.5)
Chloride: 99 mEq/L (ref 96–112)
Creatinine, Ser: 1.11 mg/dL (ref 0.40–1.20)
GFR: 47.21 mL/min — ABNORMAL LOW (ref >60.00)
Glucose, Bld: 82 mg/dL (ref 70–99)
Potassium: 4.6 mEq/L (ref 3.5–5.1)
Sodium: 138 mEq/L (ref 135–145)
Total Bilirubin: 0.5 mg/dL (ref 0.2–1.2)
Total Protein: 7.6 g/dL (ref 6.0–8.3)

## 2019-08-11 LAB — TSH: TSH: 6.01 u[IU]/mL — ABNORMAL HIGH (ref 0.35–4.50)

## 2019-08-11 MED ORDER — MIRABEGRON ER 50 MG PO TB24: 50.0000 mg | ORAL_TABLET | Freq: Every day | ORAL | 5 refills | Status: DC

## 2019-08-11 NOTE — Assessment & Plan Note (Signed)
S: Medication:Atorvastatin 20Mg  Lab Results  Component Value Date   CHOL 198 02/10/2019   HDL 41.40 02/10/2019   LDLCALC 124 (H) 06/21/2013   LDLDIRECT 116.0 02/10/2019   TRIG 269.0 (H) 02/10/2019   CHOLHDL 5 02/10/2019   A/P: Mild poor control of cholesterol with bad cholesterol at 116 and ideal goal being under 70.  With that being said being age 79 I do not feel strongly about increasing dose of medicine for primary prevention.  We also discussed possibly changing to rosuvastatin as may have a better profile in regards to memory loss but in the end we opted to continue current medications -If she does have history of stroke on MRI and obviously this would change her goals

## 2019-08-11 NOTE — Assessment & Plan Note (Signed)
S: Rate Controlled with Nadolol Anticoagulated with Xarelto Previously- She Stopped Xarelto after Subretinal Hemorrhage and Has Decided so Far Not to Restart Chadsvasc Score of 4 A/P: Patient Is Appropriately Rate Controlled.  She Prefers to Remain on Xarelto Due To History of Subretinal Hemorrhage"on the Other Hand She Does Agree If She Has a History of Stroke Particularly in Relation to Worsening Memory over the Last Year That She Would Consider Restarting Xarelto so We Opted to Get Our Team to Follow through with MRI

## 2019-08-11 NOTE — Assessment & Plan Note (Signed)
S: Medication: Venlafaxine XR 150MG  Depression screen [facility] 2/9 08/11/2019 02/10/2019 12/15/2018  Decreased Interest 0 0 0  Down, Depressed, Hopeless 0 0 0  PHQ - 2 Score 0 0 0  Altered sleeping 0 0 -  Tired, decreased energy 2 0 -  Change in appetite 0 1 -  Feeling bad or failure about yourself  0 0 -  Trouble concentrating 0 1 -  Moving slowly or fidgety/restless 0 0 -  Suicidal thoughts 0 0 -  PHQ-9 Score 2 2 -  Difficult doing work/chores Not difficult at all Not difficult at all -  Some recent data might be hidden  A/P: Full remission/reasonable control- continue current meds

## 2019-08-11 NOTE — Progress Notes (Deleted)
Duplicate note

## 2019-08-13 ENCOUNTER — Ambulatory Visit (INDEPENDENT_AMBULATORY_CARE_PROVIDER_SITE_OTHER): Payer: Medicare Other | Admitting: Family Medicine

## 2019-08-13 ENCOUNTER — Other Ambulatory Visit: Payer: Self-pay

## 2019-08-13 ENCOUNTER — Encounter: Payer: Self-pay | Admitting: Family Medicine

## 2019-08-13 VITALS — BP 130/80 | HR 79 | Temp 97.6°F | Ht 67.0 in | Wt 146.8 lb

## 2019-08-13 DIAGNOSIS — R300 Dysuria: Secondary | ICD-10-CM | POA: Diagnosis not present

## 2019-08-13 DIAGNOSIS — E039 Hypothyroidism, unspecified: Secondary | ICD-10-CM

## 2019-08-13 LAB — POCT URINALYSIS DIPSTICK
Bilirubin, UA: NEGATIVE
Blood, UA: NEGATIVE
Glucose, UA: NEGATIVE
Ketones, UA: NEGATIVE
Nitrite, UA: NEGATIVE
Protein, UA: POSITIVE — AB
Spec Grav, UA: 1.025 (ref 1.010–1.025)
Urobilinogen, UA: 0.2 E.U./dL
pH, UA: 6 (ref 5.0–8.0)

## 2019-08-13 MED ORDER — CEFUROXIME AXETIL 250 MG PO TABS: 250.0000 mg | ORAL_TABLET | Freq: Two times a day (BID) | ORAL | 0 refills | Status: DC

## 2019-08-13 NOTE — Patient Instructions (Addendum)
Sending in ceftin for you to take for UTI. You will take this twice a day for 7 days. Your culture should be back in 48 hours. i'll let you know if we need to change medication.   Zeasorb powder is good for wicking moisture and helping to prevent yeast infection.   I would hold off on increasing her myrbetriq to see if increased frequency is due to UTI.   Otherwise looks good!  Good to see you again!  Dr. Rogers Blocker    Urinary Tract Infection, Adult A urinary tract infection (UTI) is an infection of any part of the urinary tract. The urinary tract includes:  The kidneys.  The ureters.  The bladder.  The urethra. These organs make, store, and get rid of pee (urine) in the body. What are the causes? This is caused by germs (bacteria) in your genital area. These germs grow and cause swelling (inflammation) of your urinary tract. What increases the risk? You are more likely to develop this condition if:  You have a small, thin tube (catheter) to drain pee.  You cannot control when you pee or poop (incontinence).  You are female, and: ? You use these methods to prevent pregnancy:  A medicine that kills sperm (spermicide).  A device that blocks sperm (diaphragm). ? You have low levels of a female hormone (estrogen). ? You are pregnant.  You have genes that add to your risk.  You are sexually active.  You take antibiotic medicines.  You have trouble peeing because of: ? A prostate that is bigger than normal, if you are female. ? A blockage in the part of your body that drains pee from the bladder (urethra). ? A kidney stone. ? A nerve condition that affects your bladder (neurogenic bladder). ? Not getting enough to drink. ? Not peeing often enough.  You have other conditions, such as: ? Diabetes. ? A weak disease-fighting system (immune system). ? Sickle cell disease. ? Gout. ? Injury of the spine. What are the signs or symptoms? Symptoms of this condition  include:  Needing to pee right away (urgently).  Peeing often.  Peeing small amounts often.  Pain or burning when peeing.  Blood in the pee.  Pee that smells bad or not like normal.  Trouble peeing.  Pee that is cloudy.  Fluid coming from the vagina, if you are female.  Pain in the belly or lower back. Other symptoms include:  Throwing up (vomiting).  No urge to eat.  Feeling mixed up (confused).  Being tired and grouchy (irritable).  A fever.  Watery poop (diarrhea). How is this treated? This condition may be treated with:  Antibiotic medicine.  Other medicines.  Drinking enough water. Follow these instructions at home:  Medicines  Take over-the-counter and prescription medicines only as told by your doctor.  If you were prescribed an antibiotic medicine, take it as told by your doctor. Do not stop taking it even if you start to feel better. General instructions  Make sure you: ? Pee until your bladder is empty. ? Do not hold pee for a long time. ? Empty your bladder after sex. ? Wipe from front to back after pooping if you are a female. Use each tissue one time when you wipe.  Drink enough fluid to keep your pee pale yellow.  Keep all follow-up visits as told by your doctor. This is important. Contact a doctor if:  You do not get better after 1-2 days.  Your symptoms go  away and then come back. Get help right away if:  You have very bad back pain.  You have very bad pain in your lower belly.  You have a fever.  You are sick to your stomach (nauseous).  You are throwing up. Summary  A urinary tract infection (UTI) is an infection of any part of the urinary tract.  This condition is caused by germs in your genital area.  There are many risk factors for a UTI. These include having a small, thin tube to drain pee and not being able to control when you pee or poop.  Treatment includes antibiotic medicines for germs.  Drink enough  fluid to keep your pee pale yellow. This information is not intended to replace advice given to you by your health care provider. Make sure you discuss any questions you have with your health care provider. Document Revised: 02/12/2018 Document Reviewed: 09/04/2017 Elsevier Patient Education  2020 [employer].

## 2019-08-13 NOTE — Progress Notes (Signed)
Patient: Laura Li MRN: [id number] DOB: May 28, 1940 PCP: Marin Olp, MD     Subjective:  Chief Complaint  Patient presents with  . Dysuria    HPI: The patient is a 79 y.o. female who presents today for burning with urination. She states this started a few days ago. She has dysuria with frequency and urgency. No visible blood in her urine. She doesn't think it has a smell or color change.  Pt says that pain has increased the last 2 days. She also had some bed wetting since Sunday which is abnormal for her. No fever/chills. She is unsure if she has CVA tenderness. She has no nausea/vomiting.   She did see Dr. Yong Channel on Wednesday and he told her to increase her urination pill due to increased frequency.   Review of Systems  Constitutional: Negative for chills, fatigue and fever.  Gastrointestinal: Positive for abdominal pain.  Genitourinary: Positive for dysuria, frequency, pelvic pain and urgency.  Musculoskeletal: Negative for back pain.  Neurological: Positive for headaches. Negative for dizziness and light-headedness.    Allergies Patient is allergic to dyazide [hydrochlorothiazide w-triamterene]; niacin; and penicillins.  Past Medical History Patient  has a past medical history of Anemia, Arthritis, Chronic kidney disease, Depression, Eczema, Esophageal stricture, GERD (gastroesophageal reflux disease), Headache, Heart murmur, History of DVT (deep vein thrombosis), breast cancer, Hyperlipidemia, Hypertension, Hypothyroidism, Melanoma ([place]), OA (osteoarthritis), Personal history of chemotherapy (2016), Personal history of radiation therapy (2016), Pneumothorax on right (06/15/2014), PONV (postoperative nausea and vomiting), and Wears glasses.  Surgical History Patient  has a past surgical history that includes Appendectomy (4098); Melanoma excision (2004); Mohs surgery (2005); Total abdominal hysterectomy (1992); Tonsillectomy and adenoidectomy (1949); Lipoma excision  (1978); Breast surgery (1977); Colonoscopy; Radioactive seed guided mastectomy with axillary sentinel lymph node biopsy (Left, 05/05/2014); Chest tube insertion (06/15/2014); Portacath placement (Left, 06/15/2014); Chest tube insertion (Bilateral, 06/15/2014); Port-a-cath removal (N/A, 10/05/2014); Breast lumpectomy (Left, 2016); Mastectomy (Right, 1998); and Breast excisional biopsy (Left).  Family History Pateint's family history includes Brain cancer in her maternal aunt; Breast cancer in her cousin; Breast cancer (age of onset: 67) in her maternal aunt; Cervical cancer in her paternal grandmother; Diabetes in her maternal aunt; Endometrial cancer (age of onset: 53) in her mother; Healthy in her daughter and sister; Heart attack in her father; Heart disease (age of onset: 76) in her father; Lung cancer in her paternal aunt; Tuberculosis in her maternal grandfather.  Social History Patient  reports that she quit smoking about 50 years ago. Her smoking use included cigarettes. She has a 2.25 pack-year smoking history. She has never used smokeless tobacco. She reports current alcohol use. She reports that she does not use drugs.    Objective: Vitals:   08/13/19 1313  BP: 130/80  Pulse: 79  Temp: 97.6 F (36.4 C)  TempSrc: Temporal  SpO2: 96%  Weight: 146 lb 12.8 oz (66.6 kg)  Height: 5\' 7"  (1.702 m)    Body mass index is 22.99 kg/m.  Physical Exam Vitals reviewed.  Constitutional:      Appearance: Normal appearance. She is normal weight.  HENT:     Head: Normocephalic and atraumatic.  Cardiovascular:     Rate and Rhythm: Normal rate and regular rhythm.     Heart sounds: Normal heart sounds.  Pulmonary:     Effort: Pulmonary effort is normal.     Breath sounds: Normal breath sounds.  Abdominal:     General: Abdomen is flat. Bowel sounds are normal. There  is no distension.     Palpations: Abdomen is soft.     Tenderness: There is no abdominal tenderness. There is no right CVA  tenderness.  Genitourinary:    Comments: One area of erythema on left labia majoria, but rest of skin with no irritation or erythema. No candidal infection.  Neurological:     Mental Status: She is alert.        Assessment/plan: 1. Dysuria Hx and symptoms consistent with UTI. Starting ceftin, culture pending. Precautions given. Advised holding off on increasing her myrbetriq until we see if have UTi as her increased frequency could be a result of this. Precautions given.  Also suggested zeasorb powder to help with moisture wicking and to watch one area of erythema.  - Urine Culture - POCT Urinalysis Dipstick  This visit occurred during the SARS-CoV-2 public health emergency.  Safety protocols were in place, including screening questions prior to the visit, additional usage of staff PPE, and extensive cleaning of exam room while observing appropriate contact time as indicated for disinfecting solutions.     Return if symptoms worsen or fail to improve.     Orma Flaming, MD [place]   08/13/2019

## 2019-08-14 LAB — URINE CULTURE
MICRO NUMBER:: 10554480
SPECIMEN QUALITY:: ADEQUATE

## 2019-08-16 DIAGNOSIS — Z20828 Contact with and (suspected) exposure to other viral communicable diseases: Secondary | ICD-10-CM | POA: Diagnosis not present

## 2019-08-16 DIAGNOSIS — Z1159 Encounter for screening for other viral diseases: Secondary | ICD-10-CM | POA: Diagnosis not present

## 2019-08-29 ENCOUNTER — Encounter: Payer: Self-pay | Admitting: Family Medicine

## 2019-09-01 NOTE — Telephone Encounter (Signed)
Called and reviewed all with daughter. She will call if any further questions.

## 2019-09-05 ENCOUNTER — Other Ambulatory Visit: Payer: Self-pay | Admitting: Family Medicine

## 2019-09-20 DIAGNOSIS — Z1159 Encounter for screening for other viral diseases: Secondary | ICD-10-CM | POA: Diagnosis not present

## 2019-09-20 DIAGNOSIS — Z20828 Contact with and (suspected) exposure to other viral communicable diseases: Secondary | ICD-10-CM | POA: Diagnosis not present

## 2019-09-23 ENCOUNTER — Ambulatory Visit: Payer: Medicare Other | Admitting: Adult Health

## 2019-09-27 DIAGNOSIS — Z20828 Contact with and (suspected) exposure to other viral communicable diseases: Secondary | ICD-10-CM | POA: Diagnosis not present

## 2019-09-27 DIAGNOSIS — Z1159 Encounter for screening for other viral diseases: Secondary | ICD-10-CM | POA: Diagnosis not present

## 2019-10-01 ENCOUNTER — Telehealth: Payer: Self-pay | Admitting: Family Medicine

## 2019-10-01 NOTE — Progress Notes (Addendum)
[facility] Note  10/04/2019     CHIEF COMPLAINT Patient presents for Retina Follow Up   HISTORY OF PRESENT ILLNESS: Laura Li is a 79 y.o. female who presents to the clinic today for:   HPI    Retina Follow Up    Patient presents with  Wet AMD.  In both eyes.  Duration of 6 months.  Since onset it is gradually worsening.  I, the attending physician,  performed the HPI with the patient and updated documentation appropriately.          Comments    6 month follow up MA OS, NVAMD OU- Vision isn't as good as before OS.  Retaine OTC drops 3-4 x/d OU.         Last edited by Bernarda Caffey, MD on 10/04/2019 10:04 AM. (History)    Pt feels like she is seeing worse OS Referring physician: Marin Olp, MD [place],  [place] [phone]  HISTORICAL INFORMATION:   Selected notes from the MEDICAL RECORD NUMBER    CURRENT MEDICATIONS: Current Outpatient Medications (Ophthalmic Drugs)  Medication Sig   carboxymethylcellulose (REFRESH PLUS) 0.5 % SOLN 2 drops 3 (three) times daily as needed.   Propylene Glycol (SYSTANE BALANCE) 0.6 % SOLN Apply 1-2 drops to eye daily as needed (dry eyes).    No current facility-administered medications for this visit. (Ophthalmic Drugs)   Current Outpatient Medications (Other)  Medication Sig   acetaminophen (TYLENOL) 500 MG tablet Take 500 mg by mouth 2 (two) times daily as needed for mild pain. Reported on 07/07/2015   atorvastatin (LIPITOR) 20 MG tablet TAKE 1 TABLET(20 MG) BY MOUTH DAILY   B Complex-C-E-Zn (B COMPLEX-C-E-ZINC) tablet Take 1 tablet by mouth daily.   Calcium-Vitamin D (CALTRATE 600 PLUS-VIT D PO) Take 1 tablet by mouth 2 (two) times daily.    cefUROXime (CEFTIN) 250 MG tablet Take 1 tablet (250 mg total) by mouth 2 (two) times daily with a meal.   famotidine (PEPCID) 20 MG tablet Take 1 tablet (20 mg total) by mouth 2 (two) times daily.   fexofenadine (ALLEGRA) 180 MG  tablet Take 180 mg by mouth daily as needed for allergies.    ipratropium (ATROVENT) 0.03 % nasal spray Place 2 sprays into the nose every 12 (twelve) hours.   Lactobacillus-Inulin (CULTURELLE DIGESTIVE HEALTH PO) Take 1 capsule by mouth daily.   levothyroxine (SYNTHROID) 88 MCG tablet TAKE 1 TABLET(88 MCG) BY MOUTH DAILY   mirabegron ER (MYRBETRIQ) 50 MG TB24 tablet Take 1 tablet (50 mg total) by mouth daily.   Multiple Vitamins-Minerals (CENTRUM SILVER PO) Take 1 tablet by mouth daily.   nadolol (CORGARD) 20 MG tablet TAKE 1/2 TABLET(10 MG) BY MOUTH DAILY   venlafaxine XR (EFFEXOR-XR) 150 MG 24 hr capsule TAKE 1 CAPSULE(150 MG) BY MOUTH DAILY   vitamin B-12 (CYANOCOBALAMIN) 100 MCG tablet Take 100 mcg by mouth daily.   Current Facility-Administered Medications (Other)  Medication Route   Bevacizumab (AVASTIN) SOLN 1.25 mg Intravitreal   Bevacizumab (AVASTIN) SOLN 1.25 mg Intravitreal   Bevacizumab (AVASTIN) SOLN 1.25 mg Intravitreal   Bevacizumab (AVASTIN) SOLN 1.25 mg Intravitreal   Bevacizumab (AVASTIN) SOLN 1.25 mg Intravitreal   Bevacizumab (AVASTIN) SOLN 1.25 mg Intravitreal   Bevacizumab (AVASTIN) SOLN 1.25 mg Intravitreal   Bevacizumab (AVASTIN) SOLN 1.25 mg Intravitreal      REVIEW OF SYSTEMS: ROS    Positive for: Gastrointestinal, Neurological, Genitourinary, Musculoskeletal, Endocrine, Eyes, Psychiatric, Allergic/Imm  Negative for: Constitutional, Skin, HENT, Cardiovascular, Respiratory, Heme/Lymph   Last edited by Leonie Douglas, COA on 10/04/2019  9:10 AM. (History)       ALLERGIES Allergies  Allergen Reactions   Dyazide [Hydrochlorothiazide W-Triamterene]     Presumption: drug induced vasculitis   Niacin Other (See Comments)    flushing   Penicillins Diarrhea and Nausea And Vomiting    PAST MEDICAL HISTORY Past Medical History:  Diagnosis Date   Anemia    from medicaions   Arthritis    Chronic kidney disease    some renal  impairment from meds   Depression    states no depression   Eczema    Esophageal stricture    GERD (gastroesophageal reflux disease)    Headache    Heart murmur    states it is benign told 30-40 years ago   History of DVT (deep vein thrombosis)    HX: breast cancer    melanoma   Hyperlipidemia    Hypertension    Hypothyroidism    Melanoma ([place])    OA (osteoarthritis)    Personal history of chemotherapy 2016   Left Breast Cancer   Personal history of radiation therapy 2016   Left Breast Cancer   Pneumothorax on right 06/15/2014   PONV (postoperative nausea and vomiting)    Wears glasses    Past Surgical History:  Procedure Laterality Date   APPENDECTOMY  1967   BREAST EXCISIONAL BIOPSY Left    BREAST LUMPECTOMY Left 2016   BREAST SURGERY  1977   removal of calcified milk gland right breast   CHEST TUBE INSERTION  06/15/2014   CHEST TUBE INSERTION Bilateral 06/15/2014   Procedure: CHEST TUBE INSERTION;  Surgeon: Rolm Bookbinder, MD;  Location: [facility];  Service: General;  Laterality: Bilateral;   COLONOSCOPY     [place]   right breast   MASTECTOMY Right 1998   right-nodes out   MELANOMA EXCISION  2004   right side of face   MOHS SURGERY  2005   right left-basal cell   PORT-A-CATH REMOVAL N/A 10/05/2014   Procedure: REMOVAL PORT-A-CATH;  Surgeon: Rolm Bookbinder, MD;  Location: [facility];  Service: General;  Laterality: N/A;   PORTACATH PLACEMENT Left 06/15/2014   Procedure: INSERTION PORT-A-CATH;  Surgeon: Rolm Bookbinder, MD;  Location: [facility];  Service: General;  Laterality: Left;   RADIOACTIVE SEED GUIDED PARTIAL MASTECTOMY WITH AXILLARY SENTINEL LYMPH NODE BIOPSY Left 05/05/2014   Procedure: RADIOACTIVE SEED GUIDED LEFT LUMPECTOMY WITH AXILLARY SENTINEL LYMPH NODE BIOPSY;  Surgeon: Rolm Bookbinder, MD;  Location: [facility];  Service: General;  Laterality: Left;   [place]   including ovaries    FAMILY HISTORY Family History  Problem Relation Age of Onset   Heart disease Father 61       smoker   Heart attack Father    Endometrial cancer Mother 12   Breast cancer Cousin        x 3 cousins with breast cancer (one at 62, one at 61 and one at 60)   Lung cancer Paternal Aunt        smoker   Brain cancer Maternal Aunt    Diabetes Maternal Aunt        ???   Breast cancer Maternal Aunt 26   Healthy Sister    Tuberculosis Maternal Grandfather    Cervical cancer Paternal Grandmother  Healthy Daughter    Colon cancer Neg Hx    Amblyopia Neg Hx    Blindness Neg Hx    Cataracts Neg Hx    Glaucoma Neg Hx    Macular degeneration Neg Hx    Retinal detachment Neg Hx    Strabismus Neg Hx    Retinitis pigmentosa Neg Hx     SOCIAL HISTORY Social History   Tobacco Use   Smoking status: Former Smoker    Packs/day: 0.75    Years: 3.00    Pack years: 2.25    Types: Cigarettes    Quit date: 03/11/1969    Years since quitting: 50.6   Smokeless tobacco: Never Used  Vaping Use   Vaping Use: Never used  Substance Use Topics   Alcohol use: Yes    Alcohol/week: 0.0 standard drinks    Comment: occasional wine   Drug use: No         OPHTHALMIC EXAM:  Base Eye Exam    Visual Acuity (Snellen - Linear)      Right Left   Dist cc 20/20 -2 20/100   Dist ph cc NI NI   Correction: Glasses       Tonometry (Tonopen, 9:29 AM)      Right Left   Pressure 15 15       Pupils      Dark Light Shape React APD   Right 3 2 Round Brisk None   Left 2  Round Brisk Trace       Visual Fields (Counting fingers)      Left Right     Full   Restrictions Partial outer inferior nasal deficiency        Extraocular Movement      Right Left    Full Full       Neuro/Psych    Oriented x3: Yes   Mood/Affect: Normal       Dilation    Both eyes: 1.0% Mydriacyl, 2.5%  Phenylephrine @ 9:29 AM        Slit Lamp and Fundus Exam    Slit Lamp Exam      Right Left   Lids/Lashes Dermatochalasis - upper lid, Telangiectasia Dermatochalasis - upper lid, Telangiectasia   Conjunctiva/Sclera White and quiet White and quiet   Cornea Arcus, Anterior basement membrane dystrophy, Temporal Well healed cataract wounds, nasal LRI Arcus, Anterior basement membrane dystrophy, Inferior 1+ Punctate epithelial erosions, Temporal Well healed cataract wounds   Anterior Chamber Deep and quiet.  Narrow temporal angle. Deep and quiet.  Narrow temporal angle.   Iris Round and dilated Round and dilated   Lens Multifocal PC IOL in good position with open PC Multifocal PC IOL in good position with open PC   Vitreous Vitreous syneresis, Posterior vitreous detachment Vitreous syneresis, Posterior vitreous detachment       Fundus Exam      Right Left   Disc Pink and Sharp,  trace pallor.  Focal PPA temporal Pink and Sharp, Tilted disc, mild temporal pallor   C/D Ratio 0.45 0.5   Macula flat, Blunted foveal reflex, mild Retinal pigment epithelial mottling, No heme or edema Flat, blunted foveal reflex, pigmented atrophy and sub-retinal scar ST macula, sub-retinal heme resolved--just residual pigment changes and fibrosis   Vessels Mild Vascular attenuation Vascular attenuation   Periphery Attached.  Mild reticular degeneration. Attached, No RT/RD.  Mild reticular degeneration.  No heme.        Refraction    Wearing Rx  Sphere Cylinder Axis Add   Right -0.50 +0.25 002 +2.75   Left -0.50 +0.50 102 +2.75       Manifest Refraction      Sphere Cylinder Axis Dist VA   Right       Left -0.50 +0.50 085 20/100+1          IMAGING AND PROCEDURES  Imaging and Procedures for @TODAY @  OCT, Retina - OU - Both Eyes       Right Eye Quality was good. Central Foveal Thickness: 235. Progression has been stable. Findings include normal foveal contour, no IRF, no SRF (No drusen).   Left  Eye Quality was good. Central Foveal Thickness: 239. Progression has been stable. Findings include subretinal hyper-reflective material, outer retinal atrophy, no IRF, retinal drusen , no SRF, normal foveal contour (Mild interval improvement in [facility]).   Notes *Images captured and stored on drive  Diagnosis / Impression:  OD: NFP, No IRF/SRF-stable OS: Persistent SRHM -- tr improvement   Clinical management:  See below  Abbreviations: NFP - Normal foveal profile. CME - cystoid macular edema. PED - pigment epithelial detachment. IRF - intraretinal fluid. SRF - subretinal fluid. EZ - ellipsoid zone. ERM - epiretinal membrane. ORA - outer retinal atrophy. ORT - outer retinal tubulation. 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 Valsalva retinopathy  H35.00   3. Subretinal hemorrhage of left eye  H35.62   4. Retinal edema  H35.81 OCT, Retina - OU - Both Eyes  5. Essential hypertension  I10   6. Hypertensive retinopathy of both eyes  H35.033   7. Pseudophakia of both eyes  Z96.1     1-4. 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 Yong Channel -- risk factors optimized  - S/P IVA OS #1 (08.02.19), #2 (08.30.19), #3 (09.27.19), #4 (10.25.19), #5 (11.22.19), #6 (12.20.19), #7 (01.17.20), #8 (2.28.2020)  - areas of cleared subretinal heme now atrophic  - BCVA improved from 20/250 to 20/100 today OS  - OCT shows stable outer retinal atrophy  - recommend holding injection  today -- pt in agreement  - F/U 6-9 months, sooner prn -- DFE/OCT  5,6. Hypertensive retinopathy OU  - discussed importance of tight BP control  - monitor  7. Pseudophakia OU  - s/p CE/IOL  - beautiful surgery, doing well  - monitor   Ophthalmic Meds Ordered this visit:  No orders of the defined types were placed in this encounter.      Return for 6-9 mo f/u w/DFE&OCT.  There are no Patient Instructions on file for this visit.   Explained the diagnoses, plan, and follow up with the patient and they expressed understanding.  Patient expressed understanding of the importance of proper follow up care.   This document serves as a record of services personally performed by Gardiner Sleeper, MD, PhD. It was created on their behalf by Estill Bakes, COT an ophthalmic technician. The creation  of this record is the provider's dictation and/or activities during the visit.    Electronically signed by: Estill Bakes, COT 10/01/19 @ 11:18 PM   This document serves as a record of services personally performed by Gardiner Sleeper, MD, PhD. It was created on their behalf by Estill Bakes, COT an ophthalmic technician. The creation of this record is the provider's dictation and/or activities during the visit.    Electronically signed by: Estill Bakes, COT 10/04/19 @ 11:18 PM  Gardiner Sleeper, M.D., Ph.D. Diseases & Surgery of the Retina and [place] 10/04/2019   I have reviewed the above documentation for accuracy and completeness, and I agree with the above. Gardiner Sleeper, M.D., Ph.D. 10/04/19 11:18 PM   Abbreviations: M myopia (nearsighted); A astigmatism; H hyperopia (farsighted); P presbyopia; Mrx spectacle prescription;  CTL contact lenses; OD right eye; OS left eye; OU both eyes  XT exotropia; ET esotropia; PEK punctate epithelial keratitis; PEE punctate epithelial erosions; DES dry eye syndrome; MGD meibomian gland dysfunction; ATs artificial tears;  PFAT's preservative free artificial tears; [place] nuclear sclerotic cataract; PSC posterior subcapsular cataract; ERM epi-retinal membrane; PVD posterior vitreous detachment; RD retinal detachment; DM diabetes mellitus; DR diabetic retinopathy; NPDR non-proliferative diabetic retinopathy; PDR proliferative diabetic retinopathy; CSME clinically significant macular edema; DME diabetic macular edema; dbh dot blot hemorrhages; CWS cotton wool spot; POAG primary open angle glaucoma; C/D cup-to-disc ratio; HVF humphrey visual field; GVF goldmann visual field; OCT optical coherence tomography; IOP intraocular pressure; BRVO Branch retinal vein occlusion; CRVO central retinal vein occlusion; CRAO central retinal artery occlusion; BRAO branch retinal artery occlusion; RT retinal tear; SB scleral buckle; PPV pars plana vitrectomy; VH Vitreous hemorrhage; PRP panretinal laser photocoagulation; IVK intravitreal kenalog; VMT vitreomacular traction; MH Macular hole;  NVD neovascularization of the disc; NVE neovascularization elsewhere; AREDS age related eye disease study; ARMD age related macular degeneration; POAG primary open angle glaucoma; EBMD epithelial/anterior basement membrane dystrophy; ACIOL anterior chamber intraocular lens; IOL intraocular lens; PCIOL posterior chamber intraocular lens; Phaco/IOL phacoemulsification with intraocular lens placement; [place] photorefractive keratectomy; LASIK laser assisted in situ keratomileusis; HTN hypertension; DM diabetes mellitus; COPD chronic obstructive pulmonary disease

## 2019-10-01 NOTE — Progress Notes (Signed)
  Chronic Care Management   Note  10/28/8136 Name: Laura Li MRN: [id number] DOB: [birth year]  Laura Li is a 79 y.o. year old female who is a primary care patient of Marin Olp, MD. I reached out to [facility] by phone today in response to a referral sent by Laura Li's PCP, Marin Olp, MD.   Laura Li was given information about Chronic Care Management services today including:  1. CCM service includes personalized support from designated clinical staff supervised by her physician, including individualized plan of care and coordination with other care providers 2. 24/7 contact phone numbers for assistance for urgent and routine care needs. 3. Service will only be billed when office clinical staff spend 20 minutes or more in a month to coordinate care. 4. Only one practitioner may furnish and bill the service in a calendar month. 5. The patient may stop CCM services at any time (effective at the end of the month) by phone call to the office staff.   Patient agreed to services and verbal consent obtained.   Follow up plan:  [place]

## 2019-10-04 ENCOUNTER — Encounter (INDEPENDENT_AMBULATORY_CARE_PROVIDER_SITE_OTHER): Payer: Self-pay | Admitting: Ophthalmology

## 2019-10-04 ENCOUNTER — Other Ambulatory Visit: Payer: Self-pay

## 2019-10-04 ENCOUNTER — Ambulatory Visit (INDEPENDENT_AMBULATORY_CARE_PROVIDER_SITE_OTHER): Payer: Medicare Other | Admitting: Ophthalmology

## 2019-10-04 DIAGNOSIS — H3562 Retinal hemorrhage, left eye: Secondary | ICD-10-CM | POA: Diagnosis not present

## 2019-10-04 DIAGNOSIS — Z961 Presence of intraocular lens: Secondary | ICD-10-CM | POA: Diagnosis not present

## 2019-10-04 DIAGNOSIS — H35033 Hypertensive retinopathy, bilateral: Secondary | ICD-10-CM

## 2019-10-04 DIAGNOSIS — H35012 Changes in retinal vascular appearance, left eye: Secondary | ICD-10-CM

## 2019-10-04 DIAGNOSIS — H3581 Retinal edema: Secondary | ICD-10-CM | POA: Diagnosis not present

## 2019-10-04 DIAGNOSIS — H353221 Exudative age-related macular degeneration, left eye, with active choroidal neovascularization: Secondary | ICD-10-CM

## 2019-10-04 DIAGNOSIS — H3500 Unspecified background retinopathy: Secondary | ICD-10-CM | POA: Diagnosis not present

## 2019-10-04 DIAGNOSIS — I10 Essential (primary) hypertension: Secondary | ICD-10-CM | POA: Diagnosis not present

## 2019-10-05 ENCOUNTER — Ambulatory Visit: Payer: Medicare Other

## 2019-10-05 NOTE — Progress Notes (Unsigned)
Chronic Care Management Pharmacy  Name: Laura Li  MRN: [id number] DOB: [birth year]  Chief Complaint/ HPI  Laura Li,  79 y.o. , female presents for their Initial CCM visit with the clinical pharmacist via telephone due to COVID-19 Pandemic.  PCP : Marin Olp, MD  Chronic conditions include:  Encounter Diagnoses  Name Primary?  . Overactive bladder Yes  . Major depressive disorder with single episode, in full remission ([place])   . Paroxysmal atrial fibrillation (HCC)   . Gastroesophageal reflux disease without esophagitis   . Essential hypertension   . Hyperlipidemia, unspecified hyperlipidemia type   . Hypothyroidism, unspecified type     Office Visits:  08/13/2019 (Dr. Rogers Blocker): dysuria, frequency, urgency.   Consult Visit: ***  Patient Active Problem List   Diagnosis Date Noted  . Disorder of bone and cartilage 05/23/2017  . Depression, major, single episode 04/24/2017  . MCI (mild cognitive impairment) 10/16/2016  . Overactive bladder 09/19/2016  . Paroxysmal atrial fibrillation ([place]) 06/04/2016  . Acute lower UTI 05/29/2016  . Osteopenia 05/23/2016  . Chronic deep vein thrombosis (DVT) ([place]) 09/01/2015  . Localized swelling, mass, or lump of lower extremity 11/05/2014  . Diverticulitis 08/09/2014  . History of pneumothorax 06/15/2014  . Family history of breast cancer 04/29/2014  . History of left breast cancer 04/24/2014  . History of right breast cancer 04/24/2014  . GERD (gastroesophageal reflux disease) 03/31/2014  . Hot flashes 03/31/2014  . Osteoarthritis 03/31/2014  . Chronic kidney disease, stage III (moderate) 12/09/2012  . 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  . History of vasculitis 11/09/2012  . Hypothyroidism 02/06/2006  . Hyperlipidemia 02/06/2006  . Essential hypertension 02/06/2006  . Allergic rhinitis 02/06/2006   Past Surgical History:  Procedure Laterality Date  . APPENDECTOMY  1967  . BREAST EXCISIONAL BIOPSY  Left   . BREAST LUMPECTOMY Left 2016  . BREAST SURGERY  1977   removal of calcified milk gland right breast  . CHEST TUBE INSERTION  06/15/2014  . CHEST TUBE INSERTION Bilateral 06/15/2014   Procedure: CHEST TUBE INSERTION;  Surgeon: Rolm Bookbinder, MD;  Location: [facility];  Service: General;  Laterality: Bilateral;  . COLONOSCOPY    . [place]   right breast  . MASTECTOMY Right 1998   right-nodes out  . MELANOMA EXCISION  2004   right side of face  . MOHS SURGERY  2005   right left-basal cell  . PORT-A-CATH REMOVAL N/A 10/05/2014   Procedure: REMOVAL PORT-A-CATH;  Surgeon: Rolm Bookbinder, MD;  Location: [facility];  Service: General;  Laterality: N/A;  . PORTACATH PLACEMENT Left 06/15/2014   Procedure: INSERTION PORT-A-CATH;  Surgeon: Rolm Bookbinder, MD;  Location: [facility];  Service: General;  Laterality: Left;  . RADIOACTIVE SEED GUIDED PARTIAL MASTECTOMY WITH AXILLARY SENTINEL LYMPH NODE BIOPSY Left 05/05/2014   Procedure: RADIOACTIVE SEED GUIDED LEFT LUMPECTOMY WITH AXILLARY SENTINEL LYMPH NODE BIOPSY;  Surgeon: Rolm Bookbinder, MD;  Location: [facility];  Service: General;  Laterality: Left;  . TONSILLECTOMY AND ADENOIDECTOMY  1949  . TOTAL ABDOMINAL HYSTERECTOMY  1992   including ovaries   Social History   Socioeconomic History  . Marital status: Widowed    Spouse name: Not on file  . Number of children: Not on file  . Years of education: Not on file  . Highest education level: Not on file  Occupational History  . Occupation: retired  Tobacco Use  . Smoking status: Former Smoker  Packs/day: 0.75    Years: 3.00    Pack years: 2.25    Types: Cigarettes    Quit date: 03/11/1969    Years since quitting: 50.6  . Smokeless tobacco: Never Used  Vaping Use  . Vaping Use: Never used  Substance and Sexual Activity  . Alcohol use: Yes    Alcohol/week: 0.0 standard drinks    Comment: occasional wine  . Drug use: No  . Sexual  activity: Not Currently    Birth control/protection: None  Other Topics Concern  . Not on file  Social History Narrative   Widowed 2003. Moved to [place] from [place] in 2004 after loss of husband to be near daughter. 1 daughter. 2 grandkids.       Retired from working at [employer]      Hobbies: Training and development officer   Social Determinants of Radio broadcast assistant Strain:   . Difficulty of Paying Living Expenses:   Food Insecurity:   . Worried About Charity fundraiser in the Last Year:   . Arboriculturist in the Last Year:   Transportation Needs:   . Film/video editor (Medical):   [name] Lack of Transportation (Non-Medical):   Physical Activity:   . Days of Exercise per Week:   . Minutes of Exercise per Session:   Stress:   . Feeling of Stress :   Social Connections:   . Frequency of Communication with Friends and Family:   . Frequency of Social Gatherings with Friends and Family:   . Attends Religious Services:   . Active Member of Clubs or Organizations:   . Attends Archivist Meetings:   [name] Marital Status:    Family History  Problem Relation Age of Onset  . Heart disease Father 61       smoker  . Heart attack Father   . Endometrial cancer Mother 49  . Breast cancer Cousin        x 3 cousins with breast cancer (one at 60, one at 87 and one at 26)  . Lung cancer Paternal Aunt        smoker  . Brain cancer Maternal Aunt   . Diabetes Maternal Aunt        ???  . Breast cancer Maternal Aunt 51  . Healthy Sister   . Tuberculosis Maternal Grandfather   . Cervical cancer Paternal Grandmother   . Healthy Daughter   . Colon cancer Neg Hx   . Amblyopia Neg Hx   . Blindness Neg Hx   . Cataracts Neg Hx   . Glaucoma Neg Hx   . Macular degeneration Neg Hx   . Retinal detachment Neg Hx   . Strabismus Neg Hx   . Retinitis pigmentosa Neg Hx    Allergies  Allergen Reactions  . Dyazide [Hydrochlorothiazide W-Triamterene]     Presumption: drug induced vasculitis  .  Niacin Other (See Comments)    flushing  . Penicillins Diarrhea and Nausea And Vomiting   Outpatient Encounter Medications as of 10/05/2019  Medication Sig  . acetaminophen (TYLENOL) 500 MG tablet Take 500 mg by mouth 2 (two) times daily as needed for mild pain. Reported on 07/07/2015  . atorvastatin (LIPITOR) 20 MG tablet TAKE 1 TABLET(20 MG) BY MOUTH DAILY  . B Complex-C-E-Zn (B COMPLEX-C-E-ZINC) tablet Take 1 tablet by mouth daily.  . Calcium-Vitamin D (CALTRATE 600 PLUS-VIT D PO) Take 1 tablet by mouth 2 (two) times daily.   . carboxymethylcellulose (REFRESH PLUS)  0.5 % SOLN 2 drops 3 (three) times daily as needed.  . cefUROXime (CEFTIN) 250 MG tablet Take 1 tablet (250 mg total) by mouth 2 (two) times daily with a meal.  . famotidine (PEPCID) 20 MG tablet Take 1 tablet (20 mg total) by mouth 2 (two) times daily.  . fexofenadine (ALLEGRA) 180 MG tablet Take 180 mg by mouth daily as needed for allergies.   [name] ipratropium (ATROVENT) 0.03 % nasal spray Place 2 sprays into the nose every 12 (twelve) hours.  . Lactobacillus-Inulin (CULTURELLE DIGESTIVE HEALTH PO) Take 1 capsule by mouth daily.  [name] levothyroxine (SYNTHROID) 88 MCG tablet TAKE 1 TABLET(88 MCG) BY MOUTH DAILY  . mirabegron ER (MYRBETRIQ) 50 MG TB24 tablet Take 1 tablet (50 mg total) by mouth daily.  . Multiple Vitamins-Minerals (CENTRUM SILVER PO) Take 1 tablet by mouth daily.  . nadolol (CORGARD) 20 MG tablet TAKE 1/2 TABLET(10 MG) BY MOUTH DAILY  . Propylene Glycol (SYSTANE BALANCE) 0.6 % SOLN Apply 1-2 drops to eye daily as needed (dry eyes).   . venlafaxine XR (EFFEXOR-XR) 150 MG 24 hr capsule TAKE 1 CAPSULE(150 MG) BY MOUTH DAILY  . vitamin B-12 (CYANOCOBALAMIN) 100 MCG tablet Take 100 mcg by mouth daily.   Facility-Administered Encounter Medications as of 10/05/2019  Medication  . Bevacizumab (AVASTIN) SOLN 1.25 mg  . Bevacizumab (AVASTIN) SOLN 1.25 mg  . Bevacizumab (AVASTIN) SOLN 1.25 mg  . Bevacizumab (AVASTIN) SOLN 1.25  mg  . Bevacizumab (AVASTIN) SOLN 1.25 mg  . Bevacizumab (AVASTIN) SOLN 1.25 mg  . Bevacizumab (AVASTIN) SOLN 1.25 mg  . Bevacizumab (AVASTIN) SOLN 1.25 mg   Patient Care Team    Relationship Specialty Notifications Start End  Shelva Majestic, MD PCP - General Family Medicine  04/05/14    Comment: Dutch Quint, MD Consulting Physician General Surgery  02/13/15   Lurline Hare, MD Consulting Physician Radiation Oncology  02/13/15   Magrinat, Valentino Hue, MD Consulting Physician Oncology  02/13/15   Salomon Fick, NP Nurse Practitioner Hematology and Oncology  02/13/15    Comment: Survivorship  Rennis Chris, MD Consulting Physician Ophthalmology  12/15/18   Dohmeier, Porfirio Mylar, MD Consulting Physician Neurology  12/15/18   Dahlia Byes, [facility] Pharmacist Pharmacist  10/01/19    Comment: [phone]   Current Diagnosis/Assessment: Goals Addressed   None    Hypertension   BP goal <140/90  BP Readings from Last 3 Encounters:  08/13/19 130/80  08/11/19 120/80  07/06/19 116/62   Patient has failed these meds in the past: *** Patient checks BP at home {CHL HP BP Monitoring Frequency:[phone]} Patient home BP readings are ranging: ***  Patient is currently {CHL Controlled/Uncontrolled:[phone]} on the following medications:  . ***  We discussed {CHL HP Upstream Pharmacy discussion:[phone]}.  Plan  Continue {CHL HP Upstream Pharmacy Plans:[id number]}.   AFIB   Pulse Readings from Last 3 Encounters:  08/13/19 79  08/11/19 71  07/06/19 75   HR ~70s BPM. Patient is currently rate controlled on the following medications:  . Nadolol 20 mg 1/2 tablet (10 mg) daily  Prevention of stroke in Afib. CHA2DS2/VAS = 4. ***Denies any abnormal bruising, bleeding from nose or gums or blood in urine or stool. Patient is currently {CHL Controlled/Uncontrolled:[phone]} on the following medications:  . ***  We discussed:  {CHL HP Upstream Pharmacy  discussion:[phone]}.  Plan  Continue {CHL HP Upstream Pharmacy Plans:9527673942}.  Hyperlipidemia   LDL goal < 100  Lipid Panel     Component Value Date/Time  CHOL 198 02/10/2019 1114   TRIG 269.0 (H) 02/10/2019 1114   TRIG 97 02/06/2006 1059   HDL 41.40 02/10/2019 1114   LDLCALC 124 (H) 06/21/2013 0944   LDLDIRECT 116.0 02/10/2019 1114    Hepatic Function Latest Ref Rng & Units 08/11/2019 02/10/2019 11/26/2018  Total Protein 6.0 - 8.3 g/dL 7.6 7.4 7.8  Albumin 3.5 - 5.2 g/dL 4.7 4.4 4.6  AST 0 - 37 U/L '19 20 21  '$ ALT 0 - 35 U/L '15 15 14  '$ Alk Phosphatase 39 - 117 U/L 81 72 75  Total Bilirubin 0.2 - 1.2 mg/dL 0.5 0.5 0.3  Bilirubin, Direct 0.0 - 0.3 mg/dL - - -    The ASCVD Risk score (Montreal., et al., 2013) failed to calculate for the following reasons:   The 2013 ASCVD risk score is only valid for ages 89 to 22   Patient has failed these meds in past: *** Patient is currently {CHL Controlled/[phone]} on the following medications:  . ***  We discussed:  {CHL HP Upstream Pharmacy [phone]}.  Plan  Continue {CHL HP Upstream Pharmacy [phone]}.  Hypothyroidism   Lab Results  Component Value Date/Time   TSH 6.01 (H) 08/11/2019 11:52 AM   TSH 3.29 02/10/2019 11:14 AM   TSH elevated at last visit. Patient is currently {CHL Controlled/[phone]} on the following medications:  . ***  We discussed:  {CHL HP Upstream Pharmacy [phone]}.  Plan  Continue {CHL HP Upstream Pharmacy [phone]}.  GERD   Patient {Actions; denies-reports:120008::"denies"} {gerd assoc sx:31969:o:"dysphagia","heartburn","nausea"}. Expresses understanding to avoid triggers such as {causes; exacerbators GERD:13199}.  Currently {CHL Controlled/[phone]} on: [name] Famotidine 20 mg twice daily   Plan   {rxplan:23810::"Continue current medication."}  Osteopenia / Osteoporosis   Last DEXA Scan: 2019 (Dr.  Cruzita Lederer). Osteoporosis. Patient preference to not receive OP tx.  VITD  Date Value Ref Range Status  05/23/2016 46.74 30.00 - 100.00 ng/mL Final    Patient is a candidate for pharmacologic treatment due to T-Score < -2.5 in femoral neck. Patient has failed these meds in past: *** Patient is currently {CHL Controlled/[phone]} on the following medications:  . ***  We discussed:  {Osteoporosis Counseling:23892}.  Plan  Continue {CHL HP Upstream Pharmacy [phone]}.  BPH    {denies_reports_expresses:23828} urinary symptoms {bph sx:315752} for *** {gen duration:315003}. {denies_reports_expresses:23828} side effects including dizziness.  No results found for: PSA  Patient is currently {CHL Controlled/[phone]} on  . ***  Plan  Continue {CHL HP Upstream Pharmacy [phone]}.  Depression   PHQ 9:  Patient has failed these meds in past: *** Patient is currently {CHL Controlled/[phone]} on the following medications:  . ***  We discussed:  ***  Plan  Continue {CHL HP Upstream Pharmacy XBMWU:[id number]}   ***   Patient has failed these meds in past: *** Patient is currently {CHL Controlled/[phone]} on the following medications:  . ***  We discussed:  ***  Plan  Continue {CHL HP Upstream Pharmacy OZDGU:[id number]}  Vaccines   Immunization History  Administered Date(s) Administered  . Fluad Quad(high Dose 65+) 11/26/2018  . Influenza Split 02/11/2011  . Influenza Whole 12/21/2008, 01/16/2010  . Influenza, High Dose Seasonal PF 12/05/2015, 12/18/2016, 11/24/2017  . Influenza,inj,Quad PF,6+ Mos 12/21/2013, 11/22/2014  . Influenza-Unspecified 12/09/2012  . Moderna SARS-COVID-2 Vaccination 04/01/2019, 04/29/2019  . Pneumococcal Conjugate-13 09/01/2015  . Pneumococcal Polysaccharide-23 03/11/2001, 10/18/2016  . Td 03/19/2010    Reviewed and discussed patient's vaccination history.     Plan  Recommended patient receive *** vaccine in *** office.  Medication Management   Receives prescription medications from:  [facility] ([place]) [place], [place] [address] Phone: [phone] Fax: [phone]  [facility] [place], [facility] AT [address] [place] [address] Phone: [phone] Fax: [phone]  [facility], [place] - [address] [address] AT [address] [place] [address] Phone: [phone] Fax: [phone]    ***  Plan  {US Pharmacy DIXV:85501}. ___________________________ Visit Information  RPH Follow up: *** month phone visit. CMA Follow up: *** month phone visit.  SDOH (Social Determinants of Health) assessments performed: Yes.  Ms. Rappa was given information about Chronic Care Management services today including:  1. CCM service includes personalized support from designated clinical staff supervised by her physician, including individualized plan of care and coordination with other care providers 2. 24/7 contact phone numbers for assistance for urgent and routine care needs. 3. Standard insurance, coinsurance, copays and deductibles apply for chronic care management only during months in which we provide at least 20 minutes of these services. Most insurances cover these services at 100%, however patients may be responsible for any copay, coinsurance and/or deductible if applicable. This service may help you avoid the need for more expensive face-to-face services. 4. Only one practitioner may furnish and bill the service in a calendar month. 5. The patient may stop CCM services at any time (effective at the end of the month) by phone call to the office staff.  Patient agreed to services and verbal consent obtained.   Madelin Rear, Pharm.D.,  BCGP Clinical Pharmacist [facility] [phone]

## 2019-10-11 DIAGNOSIS — Z1159 Encounter for screening for other viral diseases: Secondary | ICD-10-CM | POA: Diagnosis not present

## 2019-10-11 DIAGNOSIS — Z20828 Contact with and (suspected) exposure to other viral communicable diseases: Secondary | ICD-10-CM | POA: Diagnosis not present

## 2019-10-18 ENCOUNTER — Ambulatory Visit (INDEPENDENT_AMBULATORY_CARE_PROVIDER_SITE_OTHER): Payer: Medicare Other | Admitting: Adult Health

## 2019-10-18 VITALS — BP 146/78 | HR 70 | Ht 67.0 in | Wt 149.0 lb

## 2019-10-18 DIAGNOSIS — Z20828 Contact with and (suspected) exposure to other viral communicable diseases: Secondary | ICD-10-CM | POA: Diagnosis not present

## 2019-10-18 DIAGNOSIS — R413 Other amnesia: Secondary | ICD-10-CM

## 2019-10-18 DIAGNOSIS — Z1159 Encounter for screening for other viral diseases: Secondary | ICD-10-CM | POA: Diagnosis not present

## 2019-10-18 MED ORDER — DONEPEZIL HCL 5 MG PO TABS
5.0000 mg | ORAL_TABLET | Freq: Every day | ORAL | 5 refills | Status: DC
Start: 2019-10-18 — End: 2020-04-20

## 2019-10-18 NOTE — Patient Instructions (Signed)
Your Plan:  Start Aricept 5 mg at bedtime If your symptoms worsen or you develop new symptoms please let us know.   Thank you for coming to see [place] at [facility]. I hope we have been able to provide you high quality care today.  You may receive a patient satisfaction survey over the next few weeks. We would appreciate your feedback and comments so that we may continue to improve ourselves and the health of our patients.  Donepezil tablets What is this medicine? DONEPEZIL (doe NEP e zil) is used to treat mild to moderate dementia caused by Alzheimer's disease. This medicine may be used for other purposes; ask your health care provider or pharmacist if you have questions. COMMON BRAND NAME(S): Aricept What should I tell my health care provider before I take this medicine? They need to know if you have any of these conditions:  asthma or other lung disease  difficulty passing urine  head injury  heart disease  history of irregular heartbeat  liver disease  seizures (convulsions)  stomach or intestinal disease, ulcers or stomach bleeding  an unusual or allergic reaction to donepezil, other medicines, foods, dyes, or preservatives  pregnant or trying to get pregnant  breast-feeding How should I use this medicine? Take this medicine by mouth with a glass of water. Follow the directions on the prescription label. You may take this medicine with or without food. Take this medicine at regular intervals. This medicine is usually taken before bedtime. Do not take it more often than directed. Continue to take your medicine even if you feel better. Do not stop taking except on your doctor's advice. If you are taking the 23 mg donepezil tablet, swallow it whole; do not cut, crush, or chew it. Talk to your pediatrician regarding the use of this medicine in children. Special care may be needed. Overdosage: If you think you have taken too much of this medicine contact a  poison control center or emergency room at once. NOTE: This medicine is only for you. Do not share this medicine with others. What if I miss a dose? If you miss a dose, take it as soon as you can. If it is almost time for your next dose, take only that dose, do not take double or extra doses. What may interact with this medicine? Do not take this medicine with any of the following medications:  certain medicines for fungal infections like itraconazole, fluconazole, posaconazole, and voriconazole  cisapride  dextromethorphan; quinidine  dronedarone  pimozide  quinidine  thioridazine This medicine may also interact with the following medications:  antihistamines for allergy, cough and cold  atropine  bethanechol  carbamazepine  certain medicines for bladder problems like oxybutynin, tolterodine  certain medicines for Parkinson's disease like benztropine, trihexyphenidyl  certain medicines for stomach problems like dicyclomine, hyoscyamine  certain medicines for travel sickness like scopolamine  dexamethasone  dofetilide  ipratropium  NSAIDs, medicines for pain and inflammation, like ibuprofen or naproxen  other medicines for Alzheimer's disease  other medicines that prolong the QT interval (cause an abnormal heart rhythm)  phenobarbital  phenytoin  rifampin, rifabutin or rifapentine  ziprasidone This list may not describe all possible interactions. Give your health care provider a list of all the medicines, herbs, non-prescription drugs, or dietary supplements you use. Also tell them if you smoke, drink alcohol, or use illegal drugs. Some items may interact with your medicine. What should I watch for while using this medicine? Visit your doctor or  health care professional for regular checks on your progress. Check with your doctor or health care professional if your symptoms do not get better or if they get worse. You may get drowsy or dizzy. Do not drive,  use machinery, or do anything that needs mental alertness until you know how this drug affects you. What side effects may I notice from receiving this medicine? Side effects that you should report to your doctor or health care professional as soon as possible:  allergic reactions like skin rash, itching or hives, swelling of the face, lips, or tongue  feeling faint or lightheaded, falls  loss of bladder control  seizures  signs and symptoms of a dangerous change in heartbeat or heart rhythm like chest pain; dizziness; fast or irregular heartbeat; palpitations; feeling faint or lightheaded, falls; breathing problems  signs and symptoms of infection like fever or chills; cough; sore throat; pain or trouble passing urine  signs and symptoms of liver injury like dark yellow or brown urine; general ill feeling or flu-like symptoms; light-colored stools; loss of appetite; nausea; right upper belly pain; unusually weak or tired; yellowing of the eyes or skin  slow heartbeat or palpitations  unusual bleeding or bruising  vomiting Side effects that usually do not require medical attention (report to your doctor or health care professional if they continue or are bothersome):  diarrhea, especially when starting treatment  headache  loss of appetite  muscle cramps  nausea  stomach upset This list may not describe all possible side effects. Call your doctor for medical advice about side effects. You may report side effects to FDA at 1-800-FDA-1088. Where should I keep my medicine? Keep out of reach of children. Store at room temperature between 15 and 30 degrees C (59 and 86 degrees F). Throw away any unused medicine after the expiration date. NOTE: This sheet is a summary. It may not cover all possible information. If you have questions about this medicine, talk to your doctor, pharmacist, or health care provider.  2020 Elsevier/Gold Standard (2018-02-16 10:33:41)

## 2019-10-18 NOTE — Progress Notes (Signed)
PATIENT: Laura Li DOB: [birth year]  REASON FOR VISIT: follow up HISTORY FROM: patient  HISTORY OF PRESENT ILLNESS: Today 10/18/19:  Ms. Laura Li is an 79 year old female with a history of memory disturbance.  She returns today for follow-up.  She continues to live at [facility] and independent living.  She reports that her memory may be slightly worse.  She is able to complete all ADLs independently.  She does not operate a motor vehicle.  Denies any trouble sleeping.  She no longer does any cooking.  She reports at times she feels sad.  Her daughter reports that she gets confused with where she is living.  The daughter continues to notice some subtle changes with her memory.  She is currently not on any memory medication.  She returns today for an evaluation.  HISTORY 04/12/19:  Ms. [name] is an 79 year old female with a history of mild cognitive impairment.  She returns today for follow-up. She is here today with her daughter. She feels that her memory is worse. She continues to live [facility]. She completes all ADLs independently. Her daughter helps her with her finances. She eats in the dining room. She does not make any meals. She does not operate a motor vehicle. She reports that there are times that she is slightly depressed but daughter feels that this is mild. She is currently on Effexor. Daughter reports that there is a certain man that lives across the hallway from the patient and she often gets him confused. She states that there are multiple men name "Collier Salina." The daughter has noticed more confusion with certain things. Patient returns today for an evaluation.   REVIEW OF SYSTEMS: Out of a complete 14 system review of symptoms, the patient complains only of the following symptoms, and all other reviewed systems are negative.  See HPI  ALLERGIES: Allergies  Allergen Reactions   Dyazide [Hydrochlorothiazide W-Triamterene]     Presumption: drug induced  vasculitis   Niacin Other (See Comments)    flushing   Penicillins Diarrhea and Nausea And Vomiting    HOME MEDICATIONS: Outpatient Medications Prior to Visit  Medication Sig Dispense Refill   acetaminophen (TYLENOL) 500 MG tablet Take 500 mg by mouth 2 (two) times daily as needed for mild pain. Reported on 07/07/2015     atorvastatin (LIPITOR) 20 MG tablet TAKE 1 TABLET(20 MG) BY MOUTH DAILY 90 tablet 0   B Complex-C-E-Zn (B COMPLEX-C-E-ZINC) tablet Take 1 tablet by mouth daily.     Calcium-Vitamin D (CALTRATE 600 PLUS-VIT D PO) Take 1 tablet by mouth 2 (two) times daily.      carboxymethylcellulose (REFRESH PLUS) 0.5 % SOLN 2 drops 3 (three) times daily as needed.     famotidine (PEPCID) 20 MG tablet Take 1 tablet (20 mg total) by mouth 2 (two) times daily. 60 tablet 5   fexofenadine (ALLEGRA) 180 MG tablet Take 180 mg by mouth daily as needed for allergies.      ipratropium (ATROVENT) 0.03 % nasal spray Place 2 sprays into the nose every 12 (twelve) hours. 180 mL 3   Lactobacillus-Inulin (CULTURELLE DIGESTIVE HEALTH PO) Take 1 capsule by mouth daily.     levothyroxine (SYNTHROID) 88 MCG tablet TAKE 1 TABLET(88 MCG) BY MOUTH DAILY 90 tablet 3   mirabegron ER (MYRBETRIQ) 50 MG TB24 tablet Take 1 tablet (50 mg total) by mouth daily. 30 tablet 5   Multiple Vitamins-Minerals (CENTRUM SILVER PO) Take 1 tablet by mouth daily.  nadolol (CORGARD) 20 MG tablet TAKE 1/2 TABLET(10 MG) BY MOUTH DAILY 45 tablet 2   Propylene Glycol (SYSTANE BALANCE) 0.6 % SOLN Apply 1-2 drops to eye daily as needed (dry eyes).      venlafaxine XR (EFFEXOR-XR) 150 MG 24 hr capsule TAKE 1 CAPSULE(150 MG) BY MOUTH DAILY 90 capsule 3   vitamin B-12 (CYANOCOBALAMIN) 100 MCG tablet Take 100 mcg by mouth daily.     cefUROXime (CEFTIN) 250 MG tablet Take 1 tablet (250 mg total) by mouth 2 (two) times daily with a meal. 14 tablet 0   Facility-Administered Medications Prior to Visit  Medication Dose Route  Frequency Provider Last Rate Last Admin   Bevacizumab (AVASTIN) SOLN 1.25 mg  1.25 mg Intravitreal  Bernarda Caffey, MD   1.25 mg at 10/10/17 1318   Bevacizumab (AVASTIN) SOLN 1.25 mg  1.25 mg Intravitreal  Bernarda Caffey, MD   1.25 mg at 11/07/17 1206   Bevacizumab (AVASTIN) SOLN 1.25 mg  1.25 mg Intravitreal  Bernarda Caffey, MD   1.25 mg at 12/05/17 1212   Bevacizumab (AVASTIN) SOLN 1.25 mg  1.25 mg Intravitreal  Bernarda Caffey, MD   1.25 mg at 01/04/18 2328   Bevacizumab (AVASTIN) SOLN 1.25 mg  1.25 mg Intravitreal  Bernarda Caffey, MD   1.25 mg at 01/30/18 1230   Bevacizumab (AVASTIN) SOLN 1.25 mg  1.25 mg Intravitreal  Bernarda Caffey, MD   1.25 mg at 02/27/18 1315   Bevacizumab (AVASTIN) SOLN 1.25 mg  1.25 mg Intravitreal  Bernarda Caffey, MD   1.25 mg at 03/27/18 1138   Bevacizumab (AVASTIN) SOLN 1.25 mg  1.25 mg Intravitreal  Bernarda Caffey, MD   1.25 mg at 05/08/18 1231    PAST MEDICAL HISTORY: Past Medical History:  Diagnosis Date   Anemia    from medicaions   Arthritis    Chronic kidney disease    some renal impairment from meds   Depression    states no depression   Eczema    Esophageal stricture    GERD (gastroesophageal reflux disease)    Headache    Heart murmur    states it is benign told 30-40 years ago   History of DVT (deep vein thrombosis)    HX: breast cancer    melanoma   Hyperlipidemia    Hypertension    Hypothyroidism    Melanoma ([place])    OA (osteoarthritis)    Personal history of chemotherapy 2016   Left Breast Cancer   Personal history of radiation therapy 2016   Left Breast Cancer   Pneumothorax on right 06/15/2014   PONV (postoperative nausea and vomiting)    Wears glasses     PAST SURGICAL HISTORY: Past Surgical History:  Procedure Laterality Date   APPENDECTOMY  1967   BREAST EXCISIONAL BIOPSY Left    BREAST LUMPECTOMY Left 2016   BREAST SURGERY  1977   removal of calcified milk gland right breast   CHEST TUBE  INSERTION  06/15/2014   CHEST TUBE INSERTION Bilateral 06/15/2014   Procedure: CHEST TUBE INSERTION;  Surgeon: Rolm Bookbinder, MD;  Location: [facility];  Service: General;  Laterality: Bilateral;   COLONOSCOPY     [place]   right breast   MASTECTOMY Right 1998   right-nodes out   MELANOMA EXCISION  2004   right side of face   MOHS SURGERY  2005   right left-basal cell   PORT-A-CATH REMOVAL N/A 10/05/2014   Procedure: REMOVAL PORT-A-CATH;  Surgeon: Rodman Key  Donne Hazel, MD;  Location: [facility];  Service: General;  Laterality: N/A;   PORTACATH PLACEMENT Left 06/15/2014   Procedure: INSERTION PORT-A-CATH;  Surgeon: Rolm Bookbinder, MD;  Location: [facility];  Service: General;  Laterality: Left;   RADIOACTIVE SEED GUIDED PARTIAL MASTECTOMY WITH AXILLARY SENTINEL LYMPH NODE BIOPSY Left 05/05/2014   Procedure: RADIOACTIVE SEED GUIDED LEFT LUMPECTOMY WITH AXILLARY SENTINEL LYMPH NODE BIOPSY;  Surgeon: Rolm Bookbinder, MD;  Location: [facility];  Service: General;  Laterality: Left;   [place]   including ovaries    FAMILY HISTORY: Family History  Problem Relation Age of Onset   Heart disease Father 105       smoker   Heart attack Father    Endometrial cancer Mother 62   Breast cancer Cousin        x 3 cousins with breast cancer (one at 48, one at 46 and one at 23)   Lung cancer Paternal Aunt        smoker   Brain cancer Maternal Aunt    Diabetes Maternal Aunt        ???   Breast cancer Maternal Aunt 52   Healthy Sister    Tuberculosis Maternal Grandfather    Cervical cancer Paternal Grandmother    Healthy Daughter    Colon cancer Neg Hx    Amblyopia Neg Hx    Blindness Neg Hx    Cataracts Neg Hx    Glaucoma Neg Hx    Macular degeneration Neg Hx    Retinal detachment Neg Hx    Strabismus Neg Hx    Retinitis pigmentosa Neg Hx     SOCIAL  HISTORY: Social History   Socioeconomic History   Marital status: Widowed    Spouse name: Not on file   Number of children: Not on file   Years of education: Not on file   Highest education level: Not on file  Occupational History   Occupation: retired  Tobacco Use   Smoking status: Former Smoker    Packs/day: 0.75    Years: 3.00    Pack years: 2.25    Types: Cigarettes    Quit date: 03/11/1969    Years since quitting: 50.6   Smokeless tobacco: Never Used  Vaping Use   Vaping Use: Never used  Substance and Sexual Activity   Alcohol use: Yes    Alcohol/week: 0.0 standard drinks    Comment: occasional wine   Drug use: No   Sexual activity: Not Currently    Birth control/protection: None  Other Topics Concern   Not on file  Social History Narrative   Widowed 2003. Moved to [place] from [place] in 2004 after loss of husband to be near daughter. 1 daughter. 2 grandkids.       Retired from working at a church      Hobbies: Training and development officer   Social Determinants of Radio broadcast assistant Strain:    Difficulty of Paying Living Expenses:   Food Insecurity:    Worried About Charity fundraiser in the Last Year:    Arboriculturist in the Last Year:   Transportation Needs:    Film/video editor (Medical):    Lack of Transportation (Non-Medical):   Physical Activity:    Days of Exercise per Week:    Minutes of Exercise per Session:   Stress:    Feeling of Stress :   Social Connections:  Frequency of Communication with Friends and Family:    Frequency of Social Gatherings with Friends and Family:    Attends Religious Services:    Active Member of Clubs or Organizations:    Attends Archivist Meetings:    Marital Status:   Intimate Partner Violence:    Fear of Current or Ex-Partner:    Emotionally Abused:    Physically Abused:    Sexually Abused:       PHYSICAL EXAM  Vitals:   10/18/19 1030  BP: (!) 146/78  Pulse: 70   Weight: 149 lb (67.6 kg)  Height: 5\' 7"  (1.702 m)   Body mass index is 23.34 kg/m.   MMSE - Mini Mental State Exam 10/18/2019 04/12/2019 09/24/2018  Not completed: - - -  Orientation to time 3 4 5   Orientation to Place 4 5 5   Registration 3 3 3   Attention/ Calculation 5 1 5   Attention/Calculation-comments - - -  Recall 2 3 2   Language- name 2 objects 2 2 2   Language- repeat 1 1 1   Language- follow 3 step command 3 3 3   Language- read & follow direction 1 1 1   Write a sentence 1 1 1   Copy design 1 1 1   Copy design-comments - 6 animals named 9 animals  Total score 26 25 29      Generalized: Well developed, in no acute distress   Neurological examination  Mentation: Alert oriented to time, place, history taking. Follows all commands speech and language fluent Cranial nerve II-XII: Pupils were equal round reactive to light. Extraocular movements were full, visual field were full on confrontational test. Facial sensation and strength were normal. Uvula tongue midline. Head turning and shoulder shrug  were normal and symmetric. Motor: The motor testing reveals 5 over 5 strength of all 4 extremities. Good symmetric motor tone is noted throughout.  Sensory: Sensory testing is intact to soft touch on all 4 extremities. No evidence of extinction is noted.  Coordination: Cerebellar testing reveals good finger-nose-finger and heel-to-shin bilaterally.  Gait and station: Gait is normal.  Reflexes: Deep tendon reflexes are symmetric and normal bilaterally.   DIAGNOSTIC DATA (LABS, IMAGING, TESTING) - I reviewed patient records, labs, notes, testing and imaging myself where available.  Lab Results  Component Value Date   WBC 6.3 02/10/2019   HGB 13.2 02/10/2019   HCT 38.6 02/10/2019   MCV 91.7 02/10/2019   PLT 222.0 02/10/2019      Component Value Date/Time   NA 138 08/11/2019 1152   NA 141 06/28/2016 1118   K 4.6 08/11/2019 1152   K 4.4 06/28/2016 1118   CL 99 08/11/2019 1152    CO2 32 08/11/2019 1152   CO2 26 06/28/2016 1118   GLUCOSE 82 08/11/2019 1152   GLUCOSE 106 06/28/2016 1118   GLUCOSE 88 02/06/2006 1059   BUN 21 08/11/2019 1152   BUN 16.2 06/28/2016 1118   CREATININE 1.11 08/11/2019 1152   CREATININE 1.05 06/24/2017 1108   CREATININE 1.0 06/28/2016 1118   CALCIUM 11.1 (H) 08/11/2019 1152   CALCIUM 9.9 06/28/2016 1118   PROT 7.6 08/11/2019 1152   PROT 7.6 06/28/2016 1118   ALBUMIN 4.7 08/11/2019 1152   ALBUMIN 4.2 06/28/2016 1118   AST 19 08/11/2019 1152   AST 24 06/24/2017 1108   AST 21 06/28/2016 1118   ALT 15 08/11/2019 1152   ALT 21 06/24/2017 1108   ALT 17 06/28/2016 1118   ALKPHOS 81 08/11/2019 1152   ALKPHOS 66 06/28/2016  1118   BILITOT 0.5 08/11/2019 1152   BILITOT 0.4 06/24/2017 1108   BILITOT 0.47 06/28/2016 1118   GFRNONAA 44 (L) 11/26/2018 1300   GFRNONAA 50 (L) 06/24/2017 1108   GFRAA 51 (L) 11/26/2018 1300   GFRAA 57 (L) 06/24/2017 1108   Lab Results  Component Value Date   CHOL 198 02/10/2019   HDL 41.40 02/10/2019   LDLCALC 124 (H) 06/21/2013   LDLDIRECT 116.0 02/10/2019   TRIG 269.0 (H) 02/10/2019   CHOLHDL 5 02/10/2019   No results found for: HGBA1C Lab Results  Component Value Date   VITAMINB12 857 10/16/2017   Lab Results  Component Value Date   TSH 6.01 (H) 08/11/2019      ASSESSMENT AND PLAN 79 y.o. year old female  has a past medical history of Anemia, Arthritis, Chronic kidney disease, Depression, Eczema, Esophageal stricture, GERD (gastroesophageal reflux disease), Headache, Heart murmur, History of DVT (deep vein thrombosis), breast cancer, Hyperlipidemia, Hypertension, Hypothyroidism, Melanoma ([place]), OA (osteoarthritis), Personal history of chemotherapy (2016), Personal history of radiation therapy (2016), Pneumothorax on right (06/15/2014), PONV (postoperative nausea and vomiting), and Wears glasses. here with:  1.  Memory disturbance   MMSE stable 26 out of 30  Start Aricept 5 mg at bedtime-can  increase to 10 mg in 1 month if tolerating well  Reviewed potential side effects with the patient and her daughter.  Follow-up in 6 months or sooner if needed   I spent 25 minutes of face-to-face and non-face-to-face time with patient.  This included previsit chart review, lab review, study review, order entry, electronic health record documentation, patient education.  Ward Givens, MSN, NP-C 10/18/2019, 10:50 AM [facility] [address], [place], [place] [address] [phone]

## 2019-10-20 ENCOUNTER — Other Ambulatory Visit: Payer: Self-pay

## 2019-10-20 MED ORDER — LEVOTHYROXINE SODIUM 88 MCG PO TABS: ORAL_TABLET | ORAL | 3 refills | Status: DC

## 2019-10-22 ENCOUNTER — Telehealth: Payer: Self-pay

## 2019-10-22 ENCOUNTER — Other Ambulatory Visit: Payer: Self-pay

## 2019-10-22 MED ORDER — LEVOTHYROXINE SODIUM 88 MCG PO TABS: ORAL_TABLET | ORAL | 3 refills | Status: DC

## 2019-10-22 NOTE — Telephone Encounter (Signed)
The pt needs this medication sent into a different pharmacy because the computers are down at their regular pharmacy  levothyroxine (SYNTHROID) [address] MCG tablet  [facility] [address] [place], [place] [address]  Pt is out of medication today

## 2019-10-22 NOTE — Telephone Encounter (Signed)
Meds called in. Called daughter and let her know.

## 2019-10-25 DIAGNOSIS — Z1159 Encounter for screening for other viral diseases: Secondary | ICD-10-CM | POA: Diagnosis not present

## 2019-10-25 DIAGNOSIS — Z20828 Contact with and (suspected) exposure to other viral communicable diseases: Secondary | ICD-10-CM | POA: Diagnosis not present

## 2019-11-01 DIAGNOSIS — Z20828 Contact with and (suspected) exposure to other viral communicable diseases: Secondary | ICD-10-CM | POA: Diagnosis not present

## 2019-11-01 DIAGNOSIS — Z1159 Encounter for screening for other viral diseases: Secondary | ICD-10-CM | POA: Diagnosis not present

## 2019-11-15 DIAGNOSIS — Z1159 Encounter for screening for other viral diseases: Secondary | ICD-10-CM | POA: Diagnosis not present

## 2019-11-15 DIAGNOSIS — Z20828 Contact with and (suspected) exposure to other viral communicable diseases: Secondary | ICD-10-CM | POA: Diagnosis not present

## 2019-11-22 DIAGNOSIS — Z1159 Encounter for screening for other viral diseases: Secondary | ICD-10-CM | POA: Diagnosis not present

## 2019-11-22 DIAGNOSIS — Z20828 Contact with and (suspected) exposure to other viral communicable diseases: Secondary | ICD-10-CM | POA: Diagnosis not present

## 2019-11-25 ENCOUNTER — Other Ambulatory Visit: Payer: Self-pay

## 2019-11-25 ENCOUNTER — Inpatient Hospital Stay: Payer: Medicare Other

## 2019-11-25 ENCOUNTER — Inpatient Hospital Stay: Payer: Medicare Other | Attending: Oncology | Admitting: Oncology

## 2019-11-25 VITALS — BP 150/86 | HR 85 | Temp 97.7°F | Resp 18 | Ht 67.0 in | Wt 148.8 lb

## 2019-11-25 DIAGNOSIS — Z853 Personal history of malignant neoplasm of breast: Secondary | ICD-10-CM | POA: Insufficient documentation

## 2019-11-25 DIAGNOSIS — D630 Anemia in neoplastic disease: Secondary | ICD-10-CM | POA: Diagnosis not present

## 2019-11-25 DIAGNOSIS — N1832 Chronic kidney disease, stage 3b: Secondary | ICD-10-CM

## 2019-11-25 DIAGNOSIS — M810 Age-related osteoporosis without current pathological fracture: Secondary | ICD-10-CM | POA: Insufficient documentation

## 2019-11-25 LAB — CBC WITH DIFFERENTIAL/PLATELET
Abs Immature Granulocytes: 0.02 10*3/uL (ref 0.00–0.07)
Basophils Absolute: 0 10*3/uL (ref 0.0–0.1)
Basophils Relative: 1 %
Eosinophils Absolute: 0.2 10*3/uL (ref 0.0–0.5)
Eosinophils Relative: 4 %
HCT: 41 % (ref 36.0–46.0)
Hemoglobin: 13.8 g/dL (ref 12.0–15.0)
Immature Granulocytes: 0 %
Lymphocytes Relative: 19 %
Lymphs Abs: 1.1 10*3/uL (ref 0.7–4.0)
MCH: 30.5 pg (ref 26.0–34.0)
MCHC: 33.7 g/dL (ref 30.0–36.0)
MCV: 90.7 fL (ref 80.0–100.0)
Monocytes Absolute: 0.6 10*3/uL (ref 0.1–1.0)
Monocytes Relative: 10 %
Neutro Abs: 3.9 10*3/uL (ref 1.7–7.7)
Neutrophils Relative %: 66 %
Platelets: 220 10*3/uL (ref 150–400)
RBC: 4.52 MIL/uL (ref 3.87–5.11)
RDW: 13 % (ref 11.5–15.5)
WBC: 5.9 10*3/uL (ref 4.0–10.5)
nRBC: 0 % (ref 0.0–0.2)

## 2019-11-25 LAB — COMPREHENSIVE METABOLIC PANEL
ALT: 17 U/L (ref 0–44)
AST: 21 U/L (ref 15–41)
Albumin: 4.2 g/dL (ref 3.5–5.0)
Alkaline Phosphatase: 90 U/L (ref 38–126)
Anion gap: 7 (ref 5–15)
BUN: 18 mg/dL (ref 8–23)
CO2: 29 mmol/L (ref 22–32)
Calcium: 9.7 mg/dL (ref 8.9–10.3)
Chloride: 103 mmol/L (ref 98–111)
Creatinine, Ser: 1.23 mg/dL — ABNORMAL HIGH (ref 0.44–1.00)
GFR calc Af Amer: 48 mL/min — ABNORMAL LOW (ref >60)
GFR calc non Af Amer: 41 mL/min — ABNORMAL LOW (ref >60)
Glucose, Bld: 104 mg/dL — ABNORMAL HIGH (ref 70–99)
Potassium: 4.7 mmol/L (ref 3.5–5.1)
Sodium: 139 mmol/L (ref 135–145)
Total Bilirubin: 0.7 mg/dL (ref 0.3–1.2)
Total Protein: 8.2 g/dL — ABNORMAL HIGH (ref 6.5–8.1)

## 2019-11-25 NOTE — Progress Notes (Signed)
[name]  [phone] [phone]     ID: Laura Li DOB: 04/05/1940  MR#: [id number]  VQM#:[id number]  Patient Care Team: Laura Olp, MD as PCP - General (Family Medicine) Laura Bookbinder, MD as Consulting Physician (General Surgery) Laura Silversmith, MD as Consulting Physician (Radiation Oncology) Laura Li, Laura Dad, MD as Consulting Physician (Oncology) Laura Cheese, NP as Nurse Practitioner (Hematology and Oncology) Laura Caffey, MD as Consulting Physician (Ophthalmology) Laura Li, Laura Partridge, MD as Consulting Physician (Neurology) Laura Li, [facility] as Pharmacist (Pharmacist) OTHER MD:    CHIEF COMPLAINT: 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  Her most recent bone density screening on 06/30/2017 showed a T-score of -2.5, which is considered osteoporotic.  She is here today to start Prolia.  Since her last visit, she underwent left diagnostic mammography with tomography at [facility] on 04/22/2019 showing: breast density category C; no evidence of malignancy.    REVIEW OF SYSTEMS: Laura Li has moved to [place].  She is actually Dr. Benay Li neighbor she says.  She is not using the exercise facilities there.  She is a bit unsteady but has not had any falls.  A detailed review of systems today was otherwise stable   BREAST CANCER HISTORY: From the original intake note:  Laura Li is status post right modified radical mastectomy in November 1998 for a 3 cm invasive ductal carcinoma, grade 2. Non-of the 25 lymph nodes removed from the right axilla were involved. The tumor was estrogen and progesterone receptor positive, HER-2 not amplified. She was treated adjuvantly with doxorubicin and cyclophosphamide 4, then  received 5 years of antiestrogen therapy (initially tamoxifen, later letrozole). There has been no evidence of disease recurrence on the right.  More recently, Laura Li had routine left screening [facility]. This showed a suspicious area of architectural distortion in the inferior portion of the left breast. On 93/26/7124 Laura Li underwent left diagnostic mammography with ultrasonography. Spot compression views confirmed the presence 7 area of distortion in the lower central left breast, which was not palpable by physical exam (there was a prior biopsy scar in the upper outer quadrant of the left breast). Ultrasound confirmed a hypoechoic spiculated mass at the 6:00 location in the left breast, measuring maximally 1.3 cm. Sonography of the left axilla was negative.  Biopsy of the left breast mass in question 120 10/29/2014 showed ([id number]) an invasive breast cancer with squamous features. It was estrogen and progesterone receptor negative. There was no HER-2 amplification, the signals ratio being 0.82 and the number per cell 1.80. The MIB-1 was 10%.  The patient has met with Laura Li in surgery and Laura Li in radiation oncology. The patient understands there is no survival difference between lumpectomy and radiation compared with mastectomy. The patient will need sentinel lymph node sampling with either of those procedures. If she does have a lumpectomy, she will benefit from adjuvant radiation. The patient has been set up for genetics testing and this may affect her ultimate surgical decision.  Her subsequent history is as detailed below   PAST MEDICAL HISTORY: Past Medical History:  Diagnosis Date  . Anemia    from medicaions  . Arthritis   . Chronic kidney disease    some renal impairment from meds  . Depression  states no depression  . Eczema   . Esophageal stricture   . GERD (gastroesophageal reflux disease)   . Headache     . Heart murmur    states it is benign told 30-40 years ago  . History of DVT (deep vein thrombosis)   . HX: breast cancer    melanoma  . Hyperlipidemia   . Hypertension   . Hypothyroidism   . Melanoma ([place])   . OA (osteoarthritis)   . Personal history of chemotherapy 2016   Left Breast Cancer  . Personal history of radiation therapy 2016   Left Breast Cancer  . Pneumothorax on right 06/15/2014  . PONV (postoperative nausea and vomiting)   . Wears glasses     PAST SURGICAL HISTORY: Past Surgical History:  Procedure Laterality Date  . APPENDECTOMY  1967  . BREAST EXCISIONAL BIOPSY Left   . BREAST LUMPECTOMY Left 2016  . BREAST SURGERY  1977   removal of calcified milk gland right breast  . CHEST TUBE INSERTION  06/15/2014  . CHEST TUBE INSERTION Bilateral 06/15/2014   Procedure: CHEST TUBE INSERTION;  Surgeon: Laura Bookbinder, MD;  Location: [facility];  Service: General;  Laterality: Bilateral;  . COLONOSCOPY    . [place]   right breast  . MASTECTOMY Right 1998   right-nodes out  . MELANOMA EXCISION  2004   right side of face  . MOHS SURGERY  2005   right left-basal cell  . PORT-A-CATH REMOVAL N/A 10/05/2014   Procedure: REMOVAL PORT-A-CATH;  Surgeon: Laura Bookbinder, MD;  Location: [facility];  Service: General;  Laterality: N/A;  . PORTACATH PLACEMENT Left 06/15/2014   Procedure: INSERTION PORT-A-CATH;  Surgeon: Laura Bookbinder, MD;  Location: [facility];  Service: General;  Laterality: Left;  . RADIOACTIVE SEED GUIDED PARTIAL MASTECTOMY WITH AXILLARY SENTINEL LYMPH NODE BIOPSY Left 05/05/2014   Procedure: RADIOACTIVE SEED GUIDED LEFT LUMPECTOMY WITH AXILLARY SENTINEL LYMPH NODE BIOPSY;  Surgeon: Laura Bookbinder, MD;  Location: [place];  Service: General;  Laterality: Left;  . TONSILLECTOMY AND ADENOIDECTOMY  1949  . TOTAL ABDOMINAL HYSTERECTOMY  1992   including ovaries    FAMILY HISTORY Family History  Problem Relation Age  of Onset  . Heart disease Father 57       smoker  . Heart attack Father   . Endometrial cancer Mother 66  . Breast cancer Cousin        x 3 cousins with breast cancer (one at 77, one at 21 and one at 38)  . Lung cancer Paternal Aunt        smoker  . Brain cancer Maternal Aunt   . Diabetes Maternal Aunt        ???  . Breast cancer Maternal Aunt 51  . Healthy Sister   . Tuberculosis Maternal Grandfather   . Cervical cancer Paternal Grandmother   . Healthy Daughter   . Colon cancer Neg Hx   . Amblyopia Neg Hx   . Blindness Neg Hx   . Cataracts Neg Hx   . Glaucoma Neg Hx   . Macular degeneration Neg Hx   . Retinal detachment Neg Hx   . Strabismus Neg Hx   . Retinitis pigmentosa Neg Hx   The patient's father died at the age of 82 from a myocardial infarction. The patient's mother died at the age of 28 from metastatic endometrial cancer which had been diagnosed 2 years before. The patient had no brothers, one sister.  The patient's mother's sister was diagnosed with breast cancer but the patient does not know at what age. A second maternal aunt was diagnosed with brain cancer. The patient has 3 cousins on her mother's side diagnosed with breast cancer, one of them under the age of 74. On the father's side there is a history of lung and cervical cancer   GYNECOLOGIC HISTORY:  No LMP recorded. Patient has had a hysterectomy. Menarche age 55, first live birth age 52. The patient is GX P1. She underwent hysterectomy with bilateral salpingo-oophorectomy in 1991. She used hormone replacement for approximately 7 years until 1998, when she had her right-sided breast cancer. She used oral contraceptives for approximately one year with no complications. Recall she also received tamoxifen and letrozole for a total of 5 years in the past for adjuvant treatment of her right-sided breast cancer    SOCIAL HISTORY: (Updated September 2021). 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 She is widowed  and currently lives in the independent portion of [name] Her main hobby is painting. Her daughter Laura Li lives in [place] and is also a homemaker. The patient has 2 grandchildren. She attends wholly [employer]. The patient's daughter Laura Li is her healthcare power of attorney. Laura Li can be reached at [phone]   HEALTH MAINTENANCE: Social History   Tobacco Use  . Smoking status: Former Smoker    Packs/day: 0.75    Years: 3.00    Pack years: 2.25    Types: Cigarettes    Quit date: 03/11/1969    Years since quitting: 50.7  . Smokeless tobacco: Never Used  Vaping Use  . Vaping Use: Never used  Substance Use Topics  . Alcohol use: Yes    Alcohol/week: 0.0 standard drinks    Comment: occasional wine  . Drug use: No     Colonoscopy: 20/10/[facility] Past[facility]  WER:XVQMGQ post hysterectomy  Bone density:2014/osteopenia  Lipid panel:  Allergies  Allergen Reactions  . Dyazide [Hydrochlorothiazide W-Triamterene]     Presumption: drug induced vasculitis  . Niacin Other (See Comments)    flushing  . Penicillins Diarrhea and Nausea And Vomiting    Current Outpatient Medications  Medication Sig Dispense Refill  . acetaminophen (TYLENOL) 500 MG tablet Take 500 mg by mouth 2 (two) times daily as needed for mild pain. Reported on 07/07/2015    . atorvastatin (LIPITOR) 20 MG tablet TAKE 1 TABLET(20 MG) BY MOUTH DAILY 90 tablet 0  . B Complex-C-E-Zn (B COMPLEX-C-E-ZINC) tablet Take 1 tablet by mouth daily.    . Calcium-Vitamin D (CALTRATE 600 PLUS-VIT D PO) Take 1 tablet by mouth 2 (two) times daily.     . carboxymethylcellulose (REFRESH PLUS) 0.5 % SOLN 2 drops 3 (three) times daily as needed.    . donepezil (ARICEPT) 5 MG tablet Take 1 tablet (5 mg total) by mouth at bedtime. 30 tablet 5  . famotidine (PEPCID) 20 MG tablet Take 1 tablet (20 mg total) by mouth 2 (two) times daily. 60 tablet 5  . fexofenadine (ALLEGRA) 180 MG tablet Take 180 mg by mouth  daily as needed for allergies.     [name] ipratropium (ATROVENT) 0.03 % nasal spray Place 2 sprays into the nose every 12 (twelve) hours. 180 mL 3  . Lactobacillus-Inulin (CULTURELLE DIGESTIVE HEALTH PO) Take 1 capsule by mouth daily.    [name] levothyroxine (SYNTHROID) 88 MCG tablet TAKE 1 TABLET(88 MCG) BY MOUTH DAILY 90 tablet 3  . mirabegron ER (MYRBETRIQ) 50  MG TB24 tablet Take 1 tablet (50 mg total) by mouth daily. 30 tablet 5  . Multiple Vitamins-Minerals (CENTRUM SILVER PO) Take 1 tablet by mouth daily.    . nadolol (CORGARD) 20 MG tablet TAKE 1/2 TABLET(10 MG) BY MOUTH DAILY 45 tablet 2  . Propylene Glycol (SYSTANE BALANCE) 0.6 % SOLN Apply 1-2 drops to eye daily as needed (dry eyes).     . venlafaxine XR (EFFEXOR-XR) 150 MG 24 hr capsule TAKE 1 CAPSULE(150 MG) BY MOUTH DAILY 90 capsule 3  . vitamin B-12 (CYANOCOBALAMIN) 100 MCG tablet Take 100 mcg by mouth daily.     Current Facility-Administered Medications  Medication Dose Route Frequency Provider Last Rate Last Admin  . Bevacizumab (AVASTIN) SOLN 1.25 mg  1.25 mg Intravitreal  Laura Caffey, MD   1.25 mg at 10/10/17 1318  . Bevacizumab (AVASTIN) SOLN 1.25 mg  1.25 mg Intravitreal  Laura Caffey, MD   1.25 mg at 11/07/17 1206  . Bevacizumab (AVASTIN) SOLN 1.25 mg  1.25 mg Intravitreal  Laura Caffey, MD   1.25 mg at 12/05/17 1212  . Bevacizumab (AVASTIN) SOLN 1.25 mg  1.25 mg Intravitreal  Laura Caffey, MD   1.25 mg at 01/04/18 2328  . Bevacizumab (AVASTIN) SOLN 1.25 mg  1.25 mg Intravitreal  Laura Caffey, MD   1.25 mg at 01/30/18 1230  . Bevacizumab (AVASTIN) SOLN 1.25 mg  1.25 mg Intravitreal  Laura Caffey, MD   1.25 mg at 02/27/18 1315  . Bevacizumab (AVASTIN) SOLN 1.25 mg  1.25 mg Intravitreal  Laura Caffey, MD   1.25 mg at 03/27/18 1138  . Bevacizumab (AVASTIN) SOLN 1.25 mg  1.25 mg Intravitreal  Laura Caffey, MD   1.25 mg at 05/08/18 1231    OBJECTIVE: White woman who appears frail Vitals:   11/25/19 1200  BP: (!) 150/86    Pulse: 85  Resp: 18  Temp: 97.7 F (36.5 C)  SpO2: 99%     Body mass index is 23.31 kg/m.    ECOG FS:1 - Symptomatic but completely ambulatory  Sclerae unicteric, EOMs intact Wearing a mask No cervical or supraclavicular adenopathy Lungs no rales or rhonchi Heart regular rate and rhythm Abd soft, nontender, positive bowel sounds MSK no focal spinal tenderness, no upper extremity lymphedema Neuro: nonfocal, vague in her answers and defers to her daughter, positive affect Breasts: The right breast is status post mastectomy.  There is no evidence of local recurrence per the left breast is status post lumpectomy.  There is no evidence of local recurrence.  Both axillae are benign.   LAB RESULTS:  CMP     Component Value Date/Time   NA 139 11/25/2019 1134   NA 141 06/28/2016 1118   K 4.7 11/25/2019 1134   K 4.4 06/28/2016 1118   CL 103 11/25/2019 1134   CO2 29 11/25/2019 1134   CO2 26 06/28/2016 1118   GLUCOSE 104 (H) 11/25/2019 1134   GLUCOSE 106 06/28/2016 1118   GLUCOSE 88 02/06/2006 1059   BUN 18 11/25/2019 1134   BUN 16.2 06/28/2016 1118   CREATININE 1.23 (H) 11/25/2019 1134   CREATININE 1.05 06/24/2017 1108   CREATININE 1.0 06/28/2016 1118   CALCIUM 9.7 11/25/2019 1134   CALCIUM 9.9 06/28/2016 1118   PROT 8.2 (H) 11/25/2019 1134   PROT 7.6 06/28/2016 1118   ALBUMIN 4.2 11/25/2019 1134   ALBUMIN 4.2 06/28/2016 1118   AST 21 11/25/2019 1134   AST 24 06/24/2017 1108   AST 21 06/28/2016 1118  ALT 17 11/25/2019 1134   ALT 21 06/24/2017 1108   ALT 17 06/28/2016 1118   ALKPHOS 90 11/25/2019 1134   ALKPHOS 66 06/28/2016 1118   BILITOT 0.7 11/25/2019 1134   BILITOT 0.4 06/24/2017 1108   BILITOT 0.47 06/28/2016 1118   GFRNONAA 41 (L) 11/25/2019 1134   GFRNONAA 50 (L) 06/24/2017 1108   GFRAA 48 (L) 11/25/2019 1134   GFRAA 57 (L) 06/24/2017 1108    INo results found for: SPEP, UPEP  Lab Results  Component Value Date   WBC 5.9 11/25/2019   NEUTROABS 3.9  11/25/2019   HGB 13.8 11/25/2019   HCT 41.0 11/25/2019   MCV 90.7 11/25/2019   PLT 220 11/25/2019      Chemistry      Component Value Date/Time   NA 139 11/25/2019 1134   NA 141 06/28/2016 1118   K 4.7 11/25/2019 1134   K 4.4 06/28/2016 1118   CL 103 11/25/2019 1134   CO2 29 11/25/2019 1134   CO2 26 06/28/2016 1118   BUN 18 11/25/2019 1134   BUN 16.2 06/28/2016 1118   CREATININE 1.23 (H) 11/25/2019 1134   CREATININE 1.05 06/24/2017 1108   CREATININE 1.0 06/28/2016 1118      Component Value Date/Time   CALCIUM 9.7 11/25/2019 1134   CALCIUM 9.9 06/28/2016 1118   ALKPHOS 90 11/25/2019 1134   ALKPHOS 66 06/28/2016 1118   AST 21 11/25/2019 1134   AST 24 06/24/2017 1108   AST 21 06/28/2016 1118   ALT 17 11/25/2019 1134   ALT 21 06/24/2017 1108   ALT 17 06/28/2016 1118   BILITOT 0.7 11/25/2019 1134   BILITOT 0.4 06/24/2017 1108   BILITOT 0.47 06/28/2016 1118       No results found for: LABCA2  No components found for: LABCA125  No results for input(s): INR in the last 168 hours.  Urinalysis    Component Value Date/Time   COLORURINE YELLOW 05/28/2016 2212   APPEARANCEUR CLEAR 05/28/2016 2212   LABSPEC 1.010 01/31/2018 1420   LABSPEC 1.005 08/09/2014 1052   PHURINE 6.0 01/31/2018 1420   GLUCOSEU NEGATIVE 01/31/2018 1420   GLUCOSEU Negative 08/09/2014 1052   HGBUR LARGE (A) 01/31/2018 1420   BILIRUBINUR negative 08/13/2019 1323   BILIRUBINUR Negative 08/09/2014 1052   KETONESUR NEGATIVE 01/31/2018 1420   PROTEINUR Positive (A) 08/13/2019 1323   PROTEINUR NEGATIVE 01/31/2018 1420   UROBILINOGEN 0.2 08/13/2019 1323   UROBILINOGEN 0.2 01/31/2018 1420   UROBILINOGEN 0.2 08/09/2014 1052   NITRITE negative 08/13/2019 1323   NITRITE NEGATIVE 01/31/2018 1420   LEUKOCYTESUR Large (3+) (A) 08/13/2019 1323   LEUKOCYTESUR Negative 08/09/2014 1052    STUDIES: No results found.    ASSESSMENT: 79 y.o. [place] woman  (1) status post right modified radical  mastectomy November 1998 for a pT2 pN0, stage IIA invasive ductal carcinoma, grade 2, estrogen and progesterone receptor positive, HER-2 not amplified  (a) adjuvant chemotherapy consisted of doxorubicin and cyclophosphamide 4  (b) adjuvant anti-estrogens consisted of tamoxifen, then letrozole, for a total of 5 years  (2) status post left breast lower outer quadrant biopsy 04/07/2014 for a clinical T1c N0,stage IA  invasive ductal carcinoma, with squamous features, estrogen and progesterone receptor negative, HER-2 not amplified, with an MIB-1 of 10%   (3) left lumpectomy and sentinel lymph node biopsy 05/05/2014 showed aT1c pN0, stage IA invasive ductal carcinoma, grade 1, triple negative, with close but negative margins.   (4) Mammaprint classifies the tumor as basal like, high  risk, and predicts a 30% chance of recurrence within 10 years with local treatment only.   (5) start of treatment delayed because of bilateral pneumothoraces after port placement. Adjuvant chemotherapy fiinally started 07/11/2014 consisting of carboplatin and docetaxel given every 21 days 4, completed 09/13/2014  (6) adjuvant radiation 10/31/2014-12/15/2014:  Left breast/ 45 Gy at 1.8 Gy per fraction x 25 fractions.  Left breast boost/ 16 Gy at 2 Gy per fraction x 8 fractions  (7) genetics testing March 2016 showed no deleterious mutation in the [employer] panel  [[facility] [employer]] including sequencing and rearrangement analysis for the following 24 genes:ATM, BARD1, BRCA1, BRCA2, BRIP1, CDH1, CHEK2, EPCAM, MLH1, MRE11A, MSH2, MSH6, MUTYH, NBN, NF1, PALB2, PMS2, PTEN, RAD50, RAD51C, RAD51D, SMARCA4, STK11, and TP53.   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   (8) osteopenia by bone density 06/29/2015 with [place] T score of -2.4             (a) intolerant of Fosamax (which she did take for 5 years however).  (b) repeat bone density 06/28/2017 shows a T score  of -2.5 (osteoporosis)  (c) she is interested in considering Prolia.   PLAN: Alika is now 2-9/4 years out from definitive surgery for her most recent cancer with no evidence of disease recurrence.  This is very favorable.  We discussed the fact that estrogen receptor negative breast cancers if they are going to recur tend to recur early.  At this point I feel comfortable releasing her to her primary  physician's care.  All she will need in terms of breast cancer follow-up is her yearly mammography and a yearly physician breast exam.  I will be glad to see Ebony Hail again at any point in the future if and when the need arises but as of now are making no further routine appointments for her here.  Total encounter time 20 minutes.Sarajane Jews C. Lori Liew, MD 11/25/2019 12:33 PM  Oncology and Hematology [facility] [place], [place] [address] Tel. [phone]  Joylene Igo [phone]   I, Wilburn Mylar, am acting as scribe for Dr. Sarajane Jews C. Unknown Schleyer.  I, Lurline Del MD, have reviewed the above documentation for accuracy and completeness, and I agree with the above.    *Total Encounter Time as defined by the Centers for Medicare and Medicaid Services includes, in addition to the face-to-face time of a patient visit (documented in the note above) non-face-to-face time: obtaining and reviewing outside history, ordering and reviewing medications, tests or procedures, care coordination (communications with other health care professionals or caregivers) and documentation in the medical record.

## 2019-11-26 ENCOUNTER — Telehealth: Payer: Self-pay | Admitting: Oncology

## 2019-11-26 NOTE — Telephone Encounter (Signed)
No 9/17 los. No changes made to pt's schedule.  

## 2019-11-29 DIAGNOSIS — Z1159 Encounter for screening for other viral diseases: Secondary | ICD-10-CM | POA: Diagnosis not present

## 2019-11-29 DIAGNOSIS — Z20828 Contact with and (suspected) exposure to other viral communicable diseases: Secondary | ICD-10-CM | POA: Diagnosis not present

## 2019-12-06 DIAGNOSIS — Z1159 Encounter for screening for other viral diseases: Secondary | ICD-10-CM | POA: Diagnosis not present

## 2019-12-06 DIAGNOSIS — Z20828 Contact with and (suspected) exposure to other viral communicable diseases: Secondary | ICD-10-CM | POA: Diagnosis not present

## 2019-12-07 DIAGNOSIS — Z23 Encounter for immunization: Secondary | ICD-10-CM | POA: Diagnosis not present

## 2019-12-13 DIAGNOSIS — Z20828 Contact with and (suspected) exposure to other viral communicable diseases: Secondary | ICD-10-CM | POA: Diagnosis not present

## 2019-12-13 DIAGNOSIS — Z1159 Encounter for screening for other viral diseases: Secondary | ICD-10-CM | POA: Diagnosis not present

## 2019-12-17 ENCOUNTER — Other Ambulatory Visit: Payer: Self-pay | Admitting: Family Medicine

## 2019-12-20 DIAGNOSIS — Z20828 Contact with and (suspected) exposure to other viral communicable diseases: Secondary | ICD-10-CM | POA: Diagnosis not present

## 2019-12-20 DIAGNOSIS — Z1159 Encounter for screening for other viral diseases: Secondary | ICD-10-CM | POA: Diagnosis not present

## 2019-12-27 DIAGNOSIS — Z1159 Encounter for screening for other viral diseases: Secondary | ICD-10-CM | POA: Diagnosis not present

## 2019-12-27 DIAGNOSIS — H18832 Recurrent erosion of cornea, left eye: Secondary | ICD-10-CM | POA: Diagnosis not present

## 2019-12-27 DIAGNOSIS — H18593 Other hereditary corneal dystrophies, bilateral: Secondary | ICD-10-CM | POA: Diagnosis not present

## 2019-12-27 DIAGNOSIS — Z20828 Contact with and (suspected) exposure to other viral communicable diseases: Secondary | ICD-10-CM | POA: Diagnosis not present

## 2019-12-31 ENCOUNTER — Other Ambulatory Visit: Payer: Self-pay | Admitting: Family Medicine

## 2020-01-02 ENCOUNTER — Other Ambulatory Visit: Payer: Self-pay | Admitting: Family Medicine

## 2020-01-10 DIAGNOSIS — Z1159 Encounter for screening for other viral diseases: Secondary | ICD-10-CM | POA: Diagnosis not present

## 2020-01-10 DIAGNOSIS — Z20828 Contact with and (suspected) exposure to other viral communicable diseases: Secondary | ICD-10-CM | POA: Diagnosis not present

## 2020-01-13 ENCOUNTER — Ambulatory Visit: Payer: Medicare Other

## 2020-01-17 DIAGNOSIS — Z1159 Encounter for screening for other viral diseases: Secondary | ICD-10-CM | POA: Diagnosis not present

## 2020-01-17 DIAGNOSIS — Z20828 Contact with and (suspected) exposure to other viral communicable diseases: Secondary | ICD-10-CM | POA: Diagnosis not present

## 2020-01-21 ENCOUNTER — Encounter: Payer: Self-pay | Admitting: Family Medicine

## 2020-01-21 ENCOUNTER — Telehealth (INDEPENDENT_AMBULATORY_CARE_PROVIDER_SITE_OTHER): Payer: Medicare Other | Admitting: Family Medicine

## 2020-01-21 VITALS — Ht 67.0 in | Wt 145.0 lb

## 2020-01-21 DIAGNOSIS — R399 Unspecified symptoms and signs involving the genitourinary system: Secondary | ICD-10-CM

## 2020-01-21 LAB — POCT URINALYSIS DIPSTICK
Bilirubin, UA: NEGATIVE
Blood, UA: NEGATIVE
Glucose, UA: NEGATIVE
Ketones, UA: NEGATIVE
Nitrite, UA: NEGATIVE
Protein, UA: NEGATIVE
Spec Grav, UA: 1.015 (ref 1.010–1.025)
Urobilinogen, UA: 0.2 E.U./dL
pH, UA: 6 (ref 5.0–8.0)

## 2020-01-21 NOTE — Progress Notes (Signed)
   Appolonia Ackert [place] is a 79 y.o. female who presents today for a virtual office visit.  Assessment/Plan:  UTI symptoms No red flags.  No apparent confusion on exam today.  Will check urine culture per request but will hold off on antibiotics in light of lack of other symptoms.  If culture is negative and has persistent concern for confusion will likely need to come in for office visit with labs.     Subjective:  HPI:  Patient here for virtual visit with daughter.  They were requested to get urine specimen to check for UTI symptoms.  Per patient daughter she has had a little more confusion.  She has been living in [facility] facility.  Nursing staff there told her she need to be checked for UTI.  No fevers or chills.  No back pain.  No dysuria.  She has urinary frequency at baseline.       Objective/Observations  Physical Exam: Gen: NAD, resting comfortably Pulm: Normal work of breathing Neuro: Grossly normal, moves all extremities.  Alert and oriented.  Normal thought content. Psych: Normal affect and thought content  Virtual Visit via Video   I connected with Paulita Fujita on 01/21/20 at  4:00 PM EST by a video enabled telemedicine application and verified that I am speaking with the correct person using two identifiers. The limitations of evaluation and management by telemedicine and the availability of in person appointments were discussed. The patient expressed understanding and agreed to proceed.   Patient location: Home Provider location: [place] participating in the virtual visit: Myself and Patient     Algis Greenhouse. Jerline Pain, MD 01/21/2020 3:38 PM

## 2020-01-22 LAB — URINE CULTURE
MICRO NUMBER:: 11197170
SPECIMEN QUALITY:: ADEQUATE

## 2020-01-24 DIAGNOSIS — Z20828 Contact with and (suspected) exposure to other viral communicable diseases: Secondary | ICD-10-CM | POA: Diagnosis not present

## 2020-01-24 DIAGNOSIS — Z1159 Encounter for screening for other viral diseases: Secondary | ICD-10-CM | POA: Diagnosis not present

## 2020-01-24 NOTE — Progress Notes (Signed)
Please inform patient of the following:  Urine culture is negative. She does not have a UTI. Would like for her to follow up here if still having issues.  Algis Greenhouse. Jerline Pain, MD 01/24/2020 8:08 AM

## 2020-01-28 DIAGNOSIS — R41841 Cognitive communication deficit: Secondary | ICD-10-CM | POA: Diagnosis not present

## 2020-01-31 DIAGNOSIS — Z1159 Encounter for screening for other viral diseases: Secondary | ICD-10-CM | POA: Diagnosis not present

## 2020-01-31 DIAGNOSIS — Z20828 Contact with and (suspected) exposure to other viral communicable diseases: Secondary | ICD-10-CM | POA: Diagnosis not present

## 2020-02-01 DIAGNOSIS — R41841 Cognitive communication deficit: Secondary | ICD-10-CM | POA: Diagnosis not present

## 2020-02-07 DIAGNOSIS — Z20828 Contact with and (suspected) exposure to other viral communicable diseases: Secondary | ICD-10-CM | POA: Diagnosis not present

## 2020-02-07 DIAGNOSIS — Z1159 Encounter for screening for other viral diseases: Secondary | ICD-10-CM | POA: Diagnosis not present

## 2020-02-07 DIAGNOSIS — R41841 Cognitive communication deficit: Secondary | ICD-10-CM | POA: Diagnosis not present

## 2020-02-08 DIAGNOSIS — R41841 Cognitive communication deficit: Secondary | ICD-10-CM | POA: Diagnosis not present

## 2020-02-09 DIAGNOSIS — R41841 Cognitive communication deficit: Secondary | ICD-10-CM | POA: Diagnosis not present

## 2020-02-10 DIAGNOSIS — R41841 Cognitive communication deficit: Secondary | ICD-10-CM | POA: Diagnosis not present

## 2020-02-14 DIAGNOSIS — Z20828 Contact with and (suspected) exposure to other viral communicable diseases: Secondary | ICD-10-CM | POA: Diagnosis not present

## 2020-02-14 DIAGNOSIS — Z1159 Encounter for screening for other viral diseases: Secondary | ICD-10-CM | POA: Diagnosis not present

## 2020-02-15 DIAGNOSIS — R41841 Cognitive communication deficit: Secondary | ICD-10-CM | POA: Diagnosis not present

## 2020-02-16 DIAGNOSIS — R41841 Cognitive communication deficit: Secondary | ICD-10-CM | POA: Diagnosis not present

## 2020-02-18 DIAGNOSIS — R262 Difficulty in walking, not elsewhere classified: Secondary | ICD-10-CM | POA: Diagnosis not present

## 2020-02-18 DIAGNOSIS — R488 Other symbolic dysfunctions: Secondary | ICD-10-CM | POA: Diagnosis not present

## 2020-02-18 DIAGNOSIS — M6281 Muscle weakness (generalized): Secondary | ICD-10-CM | POA: Diagnosis not present

## 2020-02-18 DIAGNOSIS — M25662 Stiffness of left knee, not elsewhere classified: Secondary | ICD-10-CM | POA: Diagnosis not present

## 2020-02-18 DIAGNOSIS — R41841 Cognitive communication deficit: Secondary | ICD-10-CM | POA: Diagnosis not present

## 2020-02-18 DIAGNOSIS — N3941 Urge incontinence: Secondary | ICD-10-CM | POA: Diagnosis not present

## 2020-02-18 DIAGNOSIS — M25661 Stiffness of right knee, not elsewhere classified: Secondary | ICD-10-CM | POA: Diagnosis not present

## 2020-02-21 DIAGNOSIS — R262 Difficulty in walking, not elsewhere classified: Secondary | ICD-10-CM | POA: Diagnosis not present

## 2020-02-21 DIAGNOSIS — M25661 Stiffness of right knee, not elsewhere classified: Secondary | ICD-10-CM | POA: Diagnosis not present

## 2020-02-21 DIAGNOSIS — M6281 Muscle weakness (generalized): Secondary | ICD-10-CM | POA: Diagnosis not present

## 2020-02-21 DIAGNOSIS — M25662 Stiffness of left knee, not elsewhere classified: Secondary | ICD-10-CM | POA: Diagnosis not present

## 2020-02-21 DIAGNOSIS — Z20828 Contact with and (suspected) exposure to other viral communicable diseases: Secondary | ICD-10-CM | POA: Diagnosis not present

## 2020-02-21 DIAGNOSIS — Z1159 Encounter for screening for other viral diseases: Secondary | ICD-10-CM | POA: Diagnosis not present

## 2020-02-21 DIAGNOSIS — R41841 Cognitive communication deficit: Secondary | ICD-10-CM | POA: Diagnosis not present

## 2020-02-21 DIAGNOSIS — N3941 Urge incontinence: Secondary | ICD-10-CM | POA: Diagnosis not present

## 2020-02-23 DIAGNOSIS — M25662 Stiffness of left knee, not elsewhere classified: Secondary | ICD-10-CM | POA: Diagnosis not present

## 2020-02-23 DIAGNOSIS — R41841 Cognitive communication deficit: Secondary | ICD-10-CM | POA: Diagnosis not present

## 2020-02-23 DIAGNOSIS — R262 Difficulty in walking, not elsewhere classified: Secondary | ICD-10-CM | POA: Diagnosis not present

## 2020-02-23 DIAGNOSIS — M25661 Stiffness of right knee, not elsewhere classified: Secondary | ICD-10-CM | POA: Diagnosis not present

## 2020-02-23 DIAGNOSIS — N3941 Urge incontinence: Secondary | ICD-10-CM | POA: Diagnosis not present

## 2020-02-23 DIAGNOSIS — M6281 Muscle weakness (generalized): Secondary | ICD-10-CM | POA: Diagnosis not present

## 2020-02-24 DIAGNOSIS — N3941 Urge incontinence: Secondary | ICD-10-CM | POA: Diagnosis not present

## 2020-02-24 DIAGNOSIS — R41841 Cognitive communication deficit: Secondary | ICD-10-CM | POA: Diagnosis not present

## 2020-02-24 DIAGNOSIS — M25661 Stiffness of right knee, not elsewhere classified: Secondary | ICD-10-CM | POA: Diagnosis not present

## 2020-02-24 DIAGNOSIS — M6281 Muscle weakness (generalized): Secondary | ICD-10-CM | POA: Diagnosis not present

## 2020-02-24 DIAGNOSIS — M25662 Stiffness of left knee, not elsewhere classified: Secondary | ICD-10-CM | POA: Diagnosis not present

## 2020-02-24 DIAGNOSIS — R262 Difficulty in walking, not elsewhere classified: Secondary | ICD-10-CM | POA: Diagnosis not present

## 2020-02-25 DIAGNOSIS — R41841 Cognitive communication deficit: Secondary | ICD-10-CM | POA: Diagnosis not present

## 2020-02-25 DIAGNOSIS — M6281 Muscle weakness (generalized): Secondary | ICD-10-CM | POA: Diagnosis not present

## 2020-02-25 DIAGNOSIS — M25662 Stiffness of left knee, not elsewhere classified: Secondary | ICD-10-CM | POA: Diagnosis not present

## 2020-02-25 DIAGNOSIS — M25661 Stiffness of right knee, not elsewhere classified: Secondary | ICD-10-CM | POA: Diagnosis not present

## 2020-02-25 DIAGNOSIS — R262 Difficulty in walking, not elsewhere classified: Secondary | ICD-10-CM | POA: Diagnosis not present

## 2020-02-25 DIAGNOSIS — N3941 Urge incontinence: Secondary | ICD-10-CM | POA: Diagnosis not present

## 2020-02-28 DIAGNOSIS — M25662 Stiffness of left knee, not elsewhere classified: Secondary | ICD-10-CM | POA: Diagnosis not present

## 2020-02-28 DIAGNOSIS — Z1159 Encounter for screening for other viral diseases: Secondary | ICD-10-CM | POA: Diagnosis not present

## 2020-02-28 DIAGNOSIS — R41841 Cognitive communication deficit: Secondary | ICD-10-CM | POA: Diagnosis not present

## 2020-02-28 DIAGNOSIS — Z20828 Contact with and (suspected) exposure to other viral communicable diseases: Secondary | ICD-10-CM | POA: Diagnosis not present

## 2020-02-28 DIAGNOSIS — R262 Difficulty in walking, not elsewhere classified: Secondary | ICD-10-CM | POA: Diagnosis not present

## 2020-02-28 DIAGNOSIS — M25661 Stiffness of right knee, not elsewhere classified: Secondary | ICD-10-CM | POA: Diagnosis not present

## 2020-02-28 DIAGNOSIS — M6281 Muscle weakness (generalized): Secondary | ICD-10-CM | POA: Diagnosis not present

## 2020-02-28 DIAGNOSIS — N3941 Urge incontinence: Secondary | ICD-10-CM | POA: Diagnosis not present

## 2020-02-29 DIAGNOSIS — M6281 Muscle weakness (generalized): Secondary | ICD-10-CM | POA: Diagnosis not present

## 2020-02-29 DIAGNOSIS — R41841 Cognitive communication deficit: Secondary | ICD-10-CM | POA: Diagnosis not present

## 2020-02-29 DIAGNOSIS — M25661 Stiffness of right knee, not elsewhere classified: Secondary | ICD-10-CM | POA: Diagnosis not present

## 2020-02-29 DIAGNOSIS — M25662 Stiffness of left knee, not elsewhere classified: Secondary | ICD-10-CM | POA: Diagnosis not present

## 2020-02-29 DIAGNOSIS — R262 Difficulty in walking, not elsewhere classified: Secondary | ICD-10-CM | POA: Diagnosis not present

## 2020-02-29 DIAGNOSIS — N3941 Urge incontinence: Secondary | ICD-10-CM | POA: Diagnosis not present

## 2020-03-01 DIAGNOSIS — R262 Difficulty in walking, not elsewhere classified: Secondary | ICD-10-CM | POA: Diagnosis not present

## 2020-03-01 DIAGNOSIS — M25662 Stiffness of left knee, not elsewhere classified: Secondary | ICD-10-CM | POA: Diagnosis not present

## 2020-03-01 DIAGNOSIS — M6281 Muscle weakness (generalized): Secondary | ICD-10-CM | POA: Diagnosis not present

## 2020-03-01 DIAGNOSIS — N3941 Urge incontinence: Secondary | ICD-10-CM | POA: Diagnosis not present

## 2020-03-01 DIAGNOSIS — M25661 Stiffness of right knee, not elsewhere classified: Secondary | ICD-10-CM | POA: Diagnosis not present

## 2020-03-01 DIAGNOSIS — R41841 Cognitive communication deficit: Secondary | ICD-10-CM | POA: Diagnosis not present

## 2020-03-02 DIAGNOSIS — Z1159 Encounter for screening for other viral diseases: Secondary | ICD-10-CM | POA: Diagnosis not present

## 2020-03-02 DIAGNOSIS — Z20828 Contact with and (suspected) exposure to other viral communicable diseases: Secondary | ICD-10-CM | POA: Diagnosis not present

## 2020-03-06 DIAGNOSIS — Z1159 Encounter for screening for other viral diseases: Secondary | ICD-10-CM | POA: Diagnosis not present

## 2020-03-06 DIAGNOSIS — N3941 Urge incontinence: Secondary | ICD-10-CM | POA: Diagnosis not present

## 2020-03-06 DIAGNOSIS — R262 Difficulty in walking, not elsewhere classified: Secondary | ICD-10-CM | POA: Diagnosis not present

## 2020-03-06 DIAGNOSIS — M25662 Stiffness of left knee, not elsewhere classified: Secondary | ICD-10-CM | POA: Diagnosis not present

## 2020-03-06 DIAGNOSIS — Z20828 Contact with and (suspected) exposure to other viral communicable diseases: Secondary | ICD-10-CM | POA: Diagnosis not present

## 2020-03-06 DIAGNOSIS — R41841 Cognitive communication deficit: Secondary | ICD-10-CM | POA: Diagnosis not present

## 2020-03-06 DIAGNOSIS — M25661 Stiffness of right knee, not elsewhere classified: Secondary | ICD-10-CM | POA: Diagnosis not present

## 2020-03-06 DIAGNOSIS — M6281 Muscle weakness (generalized): Secondary | ICD-10-CM | POA: Diagnosis not present

## 2020-03-07 DIAGNOSIS — R262 Difficulty in walking, not elsewhere classified: Secondary | ICD-10-CM | POA: Diagnosis not present

## 2020-03-07 DIAGNOSIS — N3941 Urge incontinence: Secondary | ICD-10-CM | POA: Diagnosis not present

## 2020-03-07 DIAGNOSIS — M25661 Stiffness of right knee, not elsewhere classified: Secondary | ICD-10-CM | POA: Diagnosis not present

## 2020-03-07 DIAGNOSIS — R41841 Cognitive communication deficit: Secondary | ICD-10-CM | POA: Diagnosis not present

## 2020-03-07 DIAGNOSIS — M6281 Muscle weakness (generalized): Secondary | ICD-10-CM | POA: Diagnosis not present

## 2020-03-07 DIAGNOSIS — M25662 Stiffness of left knee, not elsewhere classified: Secondary | ICD-10-CM | POA: Diagnosis not present

## 2020-03-08 DIAGNOSIS — M25662 Stiffness of left knee, not elsewhere classified: Secondary | ICD-10-CM | POA: Diagnosis not present

## 2020-03-08 DIAGNOSIS — N3941 Urge incontinence: Secondary | ICD-10-CM | POA: Diagnosis not present

## 2020-03-08 DIAGNOSIS — R41841 Cognitive communication deficit: Secondary | ICD-10-CM | POA: Diagnosis not present

## 2020-03-08 DIAGNOSIS — R262 Difficulty in walking, not elsewhere classified: Secondary | ICD-10-CM | POA: Diagnosis not present

## 2020-03-08 DIAGNOSIS — M25661 Stiffness of right knee, not elsewhere classified: Secondary | ICD-10-CM | POA: Diagnosis not present

## 2020-03-08 DIAGNOSIS — M6281 Muscle weakness (generalized): Secondary | ICD-10-CM | POA: Diagnosis not present

## 2020-03-09 DIAGNOSIS — M25661 Stiffness of right knee, not elsewhere classified: Secondary | ICD-10-CM | POA: Diagnosis not present

## 2020-03-09 DIAGNOSIS — M25662 Stiffness of left knee, not elsewhere classified: Secondary | ICD-10-CM | POA: Diagnosis not present

## 2020-03-09 DIAGNOSIS — R41841 Cognitive communication deficit: Secondary | ICD-10-CM | POA: Diagnosis not present

## 2020-03-09 DIAGNOSIS — R262 Difficulty in walking, not elsewhere classified: Secondary | ICD-10-CM | POA: Diagnosis not present

## 2020-03-09 DIAGNOSIS — N3941 Urge incontinence: Secondary | ICD-10-CM | POA: Diagnosis not present

## 2020-03-09 DIAGNOSIS — M6281 Muscle weakness (generalized): Secondary | ICD-10-CM | POA: Diagnosis not present

## 2020-03-13 DIAGNOSIS — Z1159 Encounter for screening for other viral diseases: Secondary | ICD-10-CM | POA: Diagnosis not present

## 2020-03-13 DIAGNOSIS — Z20828 Contact with and (suspected) exposure to other viral communicable diseases: Secondary | ICD-10-CM | POA: Diagnosis not present

## 2020-03-14 DIAGNOSIS — R488 Other symbolic dysfunctions: Secondary | ICD-10-CM | POA: Diagnosis not present

## 2020-03-14 DIAGNOSIS — R41841 Cognitive communication deficit: Secondary | ICD-10-CM | POA: Diagnosis not present

## 2020-03-14 DIAGNOSIS — N3941 Urge incontinence: Secondary | ICD-10-CM | POA: Diagnosis not present

## 2020-03-14 DIAGNOSIS — M25662 Stiffness of left knee, not elsewhere classified: Secondary | ICD-10-CM | POA: Diagnosis not present

## 2020-03-14 DIAGNOSIS — R262 Difficulty in walking, not elsewhere classified: Secondary | ICD-10-CM | POA: Diagnosis not present

## 2020-03-14 DIAGNOSIS — M6281 Muscle weakness (generalized): Secondary | ICD-10-CM | POA: Diagnosis not present

## 2020-03-14 DIAGNOSIS — M25661 Stiffness of right knee, not elsewhere classified: Secondary | ICD-10-CM | POA: Diagnosis not present

## 2020-03-15 DIAGNOSIS — R262 Difficulty in walking, not elsewhere classified: Secondary | ICD-10-CM | POA: Diagnosis not present

## 2020-03-15 DIAGNOSIS — M25662 Stiffness of left knee, not elsewhere classified: Secondary | ICD-10-CM | POA: Diagnosis not present

## 2020-03-15 DIAGNOSIS — M25661 Stiffness of right knee, not elsewhere classified: Secondary | ICD-10-CM | POA: Diagnosis not present

## 2020-03-15 DIAGNOSIS — R41841 Cognitive communication deficit: Secondary | ICD-10-CM | POA: Diagnosis not present

## 2020-03-15 DIAGNOSIS — N3941 Urge incontinence: Secondary | ICD-10-CM | POA: Diagnosis not present

## 2020-03-15 DIAGNOSIS — M6281 Muscle weakness (generalized): Secondary | ICD-10-CM | POA: Diagnosis not present

## 2020-03-16 DIAGNOSIS — M6281 Muscle weakness (generalized): Secondary | ICD-10-CM | POA: Diagnosis not present

## 2020-03-16 DIAGNOSIS — M25662 Stiffness of left knee, not elsewhere classified: Secondary | ICD-10-CM | POA: Diagnosis not present

## 2020-03-16 DIAGNOSIS — R262 Difficulty in walking, not elsewhere classified: Secondary | ICD-10-CM | POA: Diagnosis not present

## 2020-03-16 DIAGNOSIS — N3941 Urge incontinence: Secondary | ICD-10-CM | POA: Diagnosis not present

## 2020-03-16 DIAGNOSIS — M25661 Stiffness of right knee, not elsewhere classified: Secondary | ICD-10-CM | POA: Diagnosis not present

## 2020-03-16 DIAGNOSIS — R41841 Cognitive communication deficit: Secondary | ICD-10-CM | POA: Diagnosis not present

## 2020-03-17 DIAGNOSIS — M6281 Muscle weakness (generalized): Secondary | ICD-10-CM | POA: Diagnosis not present

## 2020-03-17 DIAGNOSIS — R262 Difficulty in walking, not elsewhere classified: Secondary | ICD-10-CM | POA: Diagnosis not present

## 2020-03-17 DIAGNOSIS — M25661 Stiffness of right knee, not elsewhere classified: Secondary | ICD-10-CM | POA: Diagnosis not present

## 2020-03-17 DIAGNOSIS — N3941 Urge incontinence: Secondary | ICD-10-CM | POA: Diagnosis not present

## 2020-03-17 DIAGNOSIS — R41841 Cognitive communication deficit: Secondary | ICD-10-CM | POA: Diagnosis not present

## 2020-03-17 DIAGNOSIS — M25662 Stiffness of left knee, not elsewhere classified: Secondary | ICD-10-CM | POA: Diagnosis not present

## 2020-03-20 DIAGNOSIS — R41841 Cognitive communication deficit: Secondary | ICD-10-CM | POA: Diagnosis not present

## 2020-03-20 DIAGNOSIS — M25661 Stiffness of right knee, not elsewhere classified: Secondary | ICD-10-CM | POA: Diagnosis not present

## 2020-03-20 DIAGNOSIS — R262 Difficulty in walking, not elsewhere classified: Secondary | ICD-10-CM | POA: Diagnosis not present

## 2020-03-20 DIAGNOSIS — M6281 Muscle weakness (generalized): Secondary | ICD-10-CM | POA: Diagnosis not present

## 2020-03-20 DIAGNOSIS — N3941 Urge incontinence: Secondary | ICD-10-CM | POA: Diagnosis not present

## 2020-03-20 DIAGNOSIS — M25662 Stiffness of left knee, not elsewhere classified: Secondary | ICD-10-CM | POA: Diagnosis not present

## 2020-03-21 ENCOUNTER — Other Ambulatory Visit: Payer: Self-pay | Admitting: Family Medicine

## 2020-03-21 DIAGNOSIS — R262 Difficulty in walking, not elsewhere classified: Secondary | ICD-10-CM | POA: Diagnosis not present

## 2020-03-21 DIAGNOSIS — Z9011 Acquired absence of right breast and nipple: Secondary | ICD-10-CM

## 2020-03-21 DIAGNOSIS — N3941 Urge incontinence: Secondary | ICD-10-CM | POA: Diagnosis not present

## 2020-03-21 DIAGNOSIS — Z853 Personal history of malignant neoplasm of breast: Secondary | ICD-10-CM

## 2020-03-21 DIAGNOSIS — M25661 Stiffness of right knee, not elsewhere classified: Secondary | ICD-10-CM | POA: Diagnosis not present

## 2020-03-21 DIAGNOSIS — R41841 Cognitive communication deficit: Secondary | ICD-10-CM | POA: Diagnosis not present

## 2020-03-21 DIAGNOSIS — Z9889 Other specified postprocedural states: Secondary | ICD-10-CM

## 2020-03-21 DIAGNOSIS — M25662 Stiffness of left knee, not elsewhere classified: Secondary | ICD-10-CM | POA: Diagnosis not present

## 2020-03-21 DIAGNOSIS — M6281 Muscle weakness (generalized): Secondary | ICD-10-CM | POA: Diagnosis not present

## 2020-03-23 ENCOUNTER — Ambulatory Visit: Payer: Medicare Other

## 2020-03-23 DIAGNOSIS — M6281 Muscle weakness (generalized): Secondary | ICD-10-CM | POA: Diagnosis not present

## 2020-03-23 DIAGNOSIS — N3941 Urge incontinence: Secondary | ICD-10-CM | POA: Diagnosis not present

## 2020-03-23 DIAGNOSIS — M25662 Stiffness of left knee, not elsewhere classified: Secondary | ICD-10-CM | POA: Diagnosis not present

## 2020-03-23 DIAGNOSIS — R262 Difficulty in walking, not elsewhere classified: Secondary | ICD-10-CM | POA: Diagnosis not present

## 2020-03-23 DIAGNOSIS — M25661 Stiffness of right knee, not elsewhere classified: Secondary | ICD-10-CM | POA: Diagnosis not present

## 2020-03-23 DIAGNOSIS — R41841 Cognitive communication deficit: Secondary | ICD-10-CM | POA: Diagnosis not present

## 2020-03-24 DIAGNOSIS — R41841 Cognitive communication deficit: Secondary | ICD-10-CM | POA: Diagnosis not present

## 2020-03-24 DIAGNOSIS — N3941 Urge incontinence: Secondary | ICD-10-CM | POA: Diagnosis not present

## 2020-03-24 DIAGNOSIS — M25661 Stiffness of right knee, not elsewhere classified: Secondary | ICD-10-CM | POA: Diagnosis not present

## 2020-03-24 DIAGNOSIS — R262 Difficulty in walking, not elsewhere classified: Secondary | ICD-10-CM | POA: Diagnosis not present

## 2020-03-24 DIAGNOSIS — M6281 Muscle weakness (generalized): Secondary | ICD-10-CM | POA: Diagnosis not present

## 2020-03-24 DIAGNOSIS — M25662 Stiffness of left knee, not elsewhere classified: Secondary | ICD-10-CM | POA: Diagnosis not present

## 2020-03-27 DIAGNOSIS — N3941 Urge incontinence: Secondary | ICD-10-CM | POA: Diagnosis not present

## 2020-03-27 DIAGNOSIS — M6281 Muscle weakness (generalized): Secondary | ICD-10-CM | POA: Diagnosis not present

## 2020-03-27 DIAGNOSIS — Z20828 Contact with and (suspected) exposure to other viral communicable diseases: Secondary | ICD-10-CM | POA: Diagnosis not present

## 2020-03-27 DIAGNOSIS — R41841 Cognitive communication deficit: Secondary | ICD-10-CM | POA: Diagnosis not present

## 2020-03-27 DIAGNOSIS — M25661 Stiffness of right knee, not elsewhere classified: Secondary | ICD-10-CM | POA: Diagnosis not present

## 2020-03-27 DIAGNOSIS — M25662 Stiffness of left knee, not elsewhere classified: Secondary | ICD-10-CM | POA: Diagnosis not present

## 2020-03-27 DIAGNOSIS — Z1159 Encounter for screening for other viral diseases: Secondary | ICD-10-CM | POA: Diagnosis not present

## 2020-03-27 DIAGNOSIS — R262 Difficulty in walking, not elsewhere classified: Secondary | ICD-10-CM | POA: Diagnosis not present

## 2020-03-28 DIAGNOSIS — N3941 Urge incontinence: Secondary | ICD-10-CM | POA: Diagnosis not present

## 2020-03-28 DIAGNOSIS — R262 Difficulty in walking, not elsewhere classified: Secondary | ICD-10-CM | POA: Diagnosis not present

## 2020-03-28 DIAGNOSIS — M25661 Stiffness of right knee, not elsewhere classified: Secondary | ICD-10-CM | POA: Diagnosis not present

## 2020-03-28 DIAGNOSIS — R41841 Cognitive communication deficit: Secondary | ICD-10-CM | POA: Diagnosis not present

## 2020-03-28 DIAGNOSIS — M25662 Stiffness of left knee, not elsewhere classified: Secondary | ICD-10-CM | POA: Diagnosis not present

## 2020-03-28 DIAGNOSIS — M6281 Muscle weakness (generalized): Secondary | ICD-10-CM | POA: Diagnosis not present

## 2020-03-29 DIAGNOSIS — M25661 Stiffness of right knee, not elsewhere classified: Secondary | ICD-10-CM | POA: Diagnosis not present

## 2020-03-29 DIAGNOSIS — R41841 Cognitive communication deficit: Secondary | ICD-10-CM | POA: Diagnosis not present

## 2020-03-29 DIAGNOSIS — M25662 Stiffness of left knee, not elsewhere classified: Secondary | ICD-10-CM | POA: Diagnosis not present

## 2020-03-29 DIAGNOSIS — M6281 Muscle weakness (generalized): Secondary | ICD-10-CM | POA: Diagnosis not present

## 2020-03-29 DIAGNOSIS — R262 Difficulty in walking, not elsewhere classified: Secondary | ICD-10-CM | POA: Diagnosis not present

## 2020-03-29 DIAGNOSIS — N3941 Urge incontinence: Secondary | ICD-10-CM | POA: Diagnosis not present

## 2020-03-30 DIAGNOSIS — R41841 Cognitive communication deficit: Secondary | ICD-10-CM | POA: Diagnosis not present

## 2020-03-30 DIAGNOSIS — N3941 Urge incontinence: Secondary | ICD-10-CM | POA: Diagnosis not present

## 2020-03-30 DIAGNOSIS — M6281 Muscle weakness (generalized): Secondary | ICD-10-CM | POA: Diagnosis not present

## 2020-03-30 DIAGNOSIS — M25661 Stiffness of right knee, not elsewhere classified: Secondary | ICD-10-CM | POA: Diagnosis not present

## 2020-03-30 DIAGNOSIS — M25662 Stiffness of left knee, not elsewhere classified: Secondary | ICD-10-CM | POA: Diagnosis not present

## 2020-03-30 DIAGNOSIS — R262 Difficulty in walking, not elsewhere classified: Secondary | ICD-10-CM | POA: Diagnosis not present

## 2020-03-31 DIAGNOSIS — N3941 Urge incontinence: Secondary | ICD-10-CM | POA: Diagnosis not present

## 2020-03-31 DIAGNOSIS — M6281 Muscle weakness (generalized): Secondary | ICD-10-CM | POA: Diagnosis not present

## 2020-03-31 DIAGNOSIS — R262 Difficulty in walking, not elsewhere classified: Secondary | ICD-10-CM | POA: Diagnosis not present

## 2020-03-31 DIAGNOSIS — M25662 Stiffness of left knee, not elsewhere classified: Secondary | ICD-10-CM | POA: Diagnosis not present

## 2020-03-31 DIAGNOSIS — M25661 Stiffness of right knee, not elsewhere classified: Secondary | ICD-10-CM | POA: Diagnosis not present

## 2020-03-31 DIAGNOSIS — R41841 Cognitive communication deficit: Secondary | ICD-10-CM | POA: Diagnosis not present

## 2020-04-03 DIAGNOSIS — M6281 Muscle weakness (generalized): Secondary | ICD-10-CM | POA: Diagnosis not present

## 2020-04-03 DIAGNOSIS — R41841 Cognitive communication deficit: Secondary | ICD-10-CM | POA: Diagnosis not present

## 2020-04-03 DIAGNOSIS — M25661 Stiffness of right knee, not elsewhere classified: Secondary | ICD-10-CM | POA: Diagnosis not present

## 2020-04-03 DIAGNOSIS — Z1159 Encounter for screening for other viral diseases: Secondary | ICD-10-CM | POA: Diagnosis not present

## 2020-04-03 DIAGNOSIS — Z20828 Contact with and (suspected) exposure to other viral communicable diseases: Secondary | ICD-10-CM | POA: Diagnosis not present

## 2020-04-03 DIAGNOSIS — R262 Difficulty in walking, not elsewhere classified: Secondary | ICD-10-CM | POA: Diagnosis not present

## 2020-04-03 DIAGNOSIS — N3941 Urge incontinence: Secondary | ICD-10-CM | POA: Diagnosis not present

## 2020-04-03 DIAGNOSIS — M25662 Stiffness of left knee, not elsewhere classified: Secondary | ICD-10-CM | POA: Diagnosis not present

## 2020-04-04 DIAGNOSIS — M6281 Muscle weakness (generalized): Secondary | ICD-10-CM | POA: Diagnosis not present

## 2020-04-04 DIAGNOSIS — M25662 Stiffness of left knee, not elsewhere classified: Secondary | ICD-10-CM | POA: Diagnosis not present

## 2020-04-04 DIAGNOSIS — M25661 Stiffness of right knee, not elsewhere classified: Secondary | ICD-10-CM | POA: Diagnosis not present

## 2020-04-04 DIAGNOSIS — R41841 Cognitive communication deficit: Secondary | ICD-10-CM | POA: Diagnosis not present

## 2020-04-04 DIAGNOSIS — R262 Difficulty in walking, not elsewhere classified: Secondary | ICD-10-CM | POA: Diagnosis not present

## 2020-04-04 DIAGNOSIS — N3941 Urge incontinence: Secondary | ICD-10-CM | POA: Diagnosis not present

## 2020-04-05 DIAGNOSIS — M25662 Stiffness of left knee, not elsewhere classified: Secondary | ICD-10-CM | POA: Diagnosis not present

## 2020-04-05 DIAGNOSIS — N3941 Urge incontinence: Secondary | ICD-10-CM | POA: Diagnosis not present

## 2020-04-05 DIAGNOSIS — R262 Difficulty in walking, not elsewhere classified: Secondary | ICD-10-CM | POA: Diagnosis not present

## 2020-04-05 DIAGNOSIS — M25661 Stiffness of right knee, not elsewhere classified: Secondary | ICD-10-CM | POA: Diagnosis not present

## 2020-04-05 DIAGNOSIS — R41841 Cognitive communication deficit: Secondary | ICD-10-CM | POA: Diagnosis not present

## 2020-04-05 DIAGNOSIS — M6281 Muscle weakness (generalized): Secondary | ICD-10-CM | POA: Diagnosis not present

## 2020-04-06 DIAGNOSIS — M25661 Stiffness of right knee, not elsewhere classified: Secondary | ICD-10-CM | POA: Diagnosis not present

## 2020-04-06 DIAGNOSIS — M25662 Stiffness of left knee, not elsewhere classified: Secondary | ICD-10-CM | POA: Diagnosis not present

## 2020-04-06 DIAGNOSIS — R41841 Cognitive communication deficit: Secondary | ICD-10-CM | POA: Diagnosis not present

## 2020-04-06 DIAGNOSIS — N3941 Urge incontinence: Secondary | ICD-10-CM | POA: Diagnosis not present

## 2020-04-06 DIAGNOSIS — R262 Difficulty in walking, not elsewhere classified: Secondary | ICD-10-CM | POA: Diagnosis not present

## 2020-04-06 DIAGNOSIS — M6281 Muscle weakness (generalized): Secondary | ICD-10-CM | POA: Diagnosis not present

## 2020-04-07 DIAGNOSIS — M6281 Muscle weakness (generalized): Secondary | ICD-10-CM | POA: Diagnosis not present

## 2020-04-07 DIAGNOSIS — M25662 Stiffness of left knee, not elsewhere classified: Secondary | ICD-10-CM | POA: Diagnosis not present

## 2020-04-07 DIAGNOSIS — M25661 Stiffness of right knee, not elsewhere classified: Secondary | ICD-10-CM | POA: Diagnosis not present

## 2020-04-07 DIAGNOSIS — R41841 Cognitive communication deficit: Secondary | ICD-10-CM | POA: Diagnosis not present

## 2020-04-07 DIAGNOSIS — R262 Difficulty in walking, not elsewhere classified: Secondary | ICD-10-CM | POA: Diagnosis not present

## 2020-04-07 DIAGNOSIS — N3941 Urge incontinence: Secondary | ICD-10-CM | POA: Diagnosis not present

## 2020-04-10 DIAGNOSIS — M25662 Stiffness of left knee, not elsewhere classified: Secondary | ICD-10-CM | POA: Diagnosis not present

## 2020-04-10 DIAGNOSIS — M25661 Stiffness of right knee, not elsewhere classified: Secondary | ICD-10-CM | POA: Diagnosis not present

## 2020-04-10 DIAGNOSIS — N3941 Urge incontinence: Secondary | ICD-10-CM | POA: Diagnosis not present

## 2020-04-10 DIAGNOSIS — Z1159 Encounter for screening for other viral diseases: Secondary | ICD-10-CM | POA: Diagnosis not present

## 2020-04-10 DIAGNOSIS — R41841 Cognitive communication deficit: Secondary | ICD-10-CM | POA: Diagnosis not present

## 2020-04-10 DIAGNOSIS — M6281 Muscle weakness (generalized): Secondary | ICD-10-CM | POA: Diagnosis not present

## 2020-04-10 DIAGNOSIS — R262 Difficulty in walking, not elsewhere classified: Secondary | ICD-10-CM | POA: Diagnosis not present

## 2020-04-10 DIAGNOSIS — Z20828 Contact with and (suspected) exposure to other viral communicable diseases: Secondary | ICD-10-CM | POA: Diagnosis not present

## 2020-04-11 DIAGNOSIS — M6281 Muscle weakness (generalized): Secondary | ICD-10-CM | POA: Diagnosis not present

## 2020-04-11 DIAGNOSIS — R278 Other lack of coordination: Secondary | ICD-10-CM | POA: Diagnosis not present

## 2020-04-11 DIAGNOSIS — N3941 Urge incontinence: Secondary | ICD-10-CM | POA: Diagnosis not present

## 2020-04-11 DIAGNOSIS — M25661 Stiffness of right knee, not elsewhere classified: Secondary | ICD-10-CM | POA: Diagnosis not present

## 2020-04-11 DIAGNOSIS — R41841 Cognitive communication deficit: Secondary | ICD-10-CM | POA: Diagnosis not present

## 2020-04-11 DIAGNOSIS — M25662 Stiffness of left knee, not elsewhere classified: Secondary | ICD-10-CM | POA: Diagnosis not present

## 2020-04-11 DIAGNOSIS — R488 Other symbolic dysfunctions: Secondary | ICD-10-CM | POA: Diagnosis not present

## 2020-04-11 DIAGNOSIS — R262 Difficulty in walking, not elsewhere classified: Secondary | ICD-10-CM | POA: Diagnosis not present

## 2020-04-12 ENCOUNTER — Encounter: Payer: Self-pay | Admitting: Family Medicine

## 2020-04-12 ENCOUNTER — Encounter: Payer: Self-pay | Admitting: Adult Health

## 2020-04-13 DIAGNOSIS — R278 Other lack of coordination: Secondary | ICD-10-CM | POA: Diagnosis not present

## 2020-04-13 DIAGNOSIS — M6281 Muscle weakness (generalized): Secondary | ICD-10-CM | POA: Diagnosis not present

## 2020-04-13 DIAGNOSIS — R41841 Cognitive communication deficit: Secondary | ICD-10-CM | POA: Diagnosis not present

## 2020-04-13 DIAGNOSIS — M25662 Stiffness of left knee, not elsewhere classified: Secondary | ICD-10-CM | POA: Diagnosis not present

## 2020-04-13 DIAGNOSIS — R262 Difficulty in walking, not elsewhere classified: Secondary | ICD-10-CM | POA: Diagnosis not present

## 2020-04-13 DIAGNOSIS — M25661 Stiffness of right knee, not elsewhere classified: Secondary | ICD-10-CM | POA: Diagnosis not present

## 2020-04-14 DIAGNOSIS — M6281 Muscle weakness (generalized): Secondary | ICD-10-CM | POA: Diagnosis not present

## 2020-04-14 DIAGNOSIS — R41841 Cognitive communication deficit: Secondary | ICD-10-CM | POA: Diagnosis not present

## 2020-04-14 DIAGNOSIS — M25661 Stiffness of right knee, not elsewhere classified: Secondary | ICD-10-CM | POA: Diagnosis not present

## 2020-04-14 DIAGNOSIS — R262 Difficulty in walking, not elsewhere classified: Secondary | ICD-10-CM | POA: Diagnosis not present

## 2020-04-14 DIAGNOSIS — M25662 Stiffness of left knee, not elsewhere classified: Secondary | ICD-10-CM | POA: Diagnosis not present

## 2020-04-14 DIAGNOSIS — R278 Other lack of coordination: Secondary | ICD-10-CM | POA: Diagnosis not present

## 2020-04-17 DIAGNOSIS — M25662 Stiffness of left knee, not elsewhere classified: Secondary | ICD-10-CM | POA: Diagnosis not present

## 2020-04-17 DIAGNOSIS — M25661 Stiffness of right knee, not elsewhere classified: Secondary | ICD-10-CM | POA: Diagnosis not present

## 2020-04-17 DIAGNOSIS — R262 Difficulty in walking, not elsewhere classified: Secondary | ICD-10-CM | POA: Diagnosis not present

## 2020-04-17 DIAGNOSIS — M6281 Muscle weakness (generalized): Secondary | ICD-10-CM | POA: Diagnosis not present

## 2020-04-17 DIAGNOSIS — R41841 Cognitive communication deficit: Secondary | ICD-10-CM | POA: Diagnosis not present

## 2020-04-17 DIAGNOSIS — Z20828 Contact with and (suspected) exposure to other viral communicable diseases: Secondary | ICD-10-CM | POA: Diagnosis not present

## 2020-04-17 DIAGNOSIS — Z1159 Encounter for screening for other viral diseases: Secondary | ICD-10-CM | POA: Diagnosis not present

## 2020-04-17 DIAGNOSIS — R278 Other lack of coordination: Secondary | ICD-10-CM | POA: Diagnosis not present

## 2020-04-18 ENCOUNTER — Other Ambulatory Visit: Payer: Self-pay | Admitting: Family Medicine

## 2020-04-18 DIAGNOSIS — M6281 Muscle weakness (generalized): Secondary | ICD-10-CM | POA: Diagnosis not present

## 2020-04-18 DIAGNOSIS — R41841 Cognitive communication deficit: Secondary | ICD-10-CM | POA: Diagnosis not present

## 2020-04-18 DIAGNOSIS — M25661 Stiffness of right knee, not elsewhere classified: Secondary | ICD-10-CM | POA: Diagnosis not present

## 2020-04-18 DIAGNOSIS — R262 Difficulty in walking, not elsewhere classified: Secondary | ICD-10-CM | POA: Diagnosis not present

## 2020-04-18 DIAGNOSIS — M25662 Stiffness of left knee, not elsewhere classified: Secondary | ICD-10-CM | POA: Diagnosis not present

## 2020-04-18 DIAGNOSIS — R278 Other lack of coordination: Secondary | ICD-10-CM | POA: Diagnosis not present

## 2020-04-19 DIAGNOSIS — M25662 Stiffness of left knee, not elsewhere classified: Secondary | ICD-10-CM | POA: Diagnosis not present

## 2020-04-19 DIAGNOSIS — M6281 Muscle weakness (generalized): Secondary | ICD-10-CM | POA: Diagnosis not present

## 2020-04-19 DIAGNOSIS — R262 Difficulty in walking, not elsewhere classified: Secondary | ICD-10-CM | POA: Diagnosis not present

## 2020-04-19 DIAGNOSIS — R41841 Cognitive communication deficit: Secondary | ICD-10-CM | POA: Diagnosis not present

## 2020-04-19 DIAGNOSIS — M25661 Stiffness of right knee, not elsewhere classified: Secondary | ICD-10-CM | POA: Diagnosis not present

## 2020-04-19 DIAGNOSIS — R278 Other lack of coordination: Secondary | ICD-10-CM | POA: Diagnosis not present

## 2020-04-20 ENCOUNTER — Other Ambulatory Visit: Payer: Self-pay

## 2020-04-20 ENCOUNTER — Ambulatory Visit (INDEPENDENT_AMBULATORY_CARE_PROVIDER_SITE_OTHER): Payer: Medicare Other | Admitting: Adult Health

## 2020-04-20 VITALS — BP 139/75 | HR 72 | Ht 67.0 in | Wt 147.0 lb

## 2020-04-20 DIAGNOSIS — F0390 Unspecified dementia without behavioral disturbance: Secondary | ICD-10-CM

## 2020-04-20 DIAGNOSIS — M6281 Muscle weakness (generalized): Secondary | ICD-10-CM | POA: Diagnosis not present

## 2020-04-20 DIAGNOSIS — R278 Other lack of coordination: Secondary | ICD-10-CM | POA: Diagnosis not present

## 2020-04-20 DIAGNOSIS — M25662 Stiffness of left knee, not elsewhere classified: Secondary | ICD-10-CM | POA: Diagnosis not present

## 2020-04-20 DIAGNOSIS — R41841 Cognitive communication deficit: Secondary | ICD-10-CM | POA: Diagnosis not present

## 2020-04-20 DIAGNOSIS — M25661 Stiffness of right knee, not elsewhere classified: Secondary | ICD-10-CM | POA: Diagnosis not present

## 2020-04-20 DIAGNOSIS — R262 Difficulty in walking, not elsewhere classified: Secondary | ICD-10-CM | POA: Diagnosis not present

## 2020-04-20 MED ORDER — MEMANTINE HCL 28 X 5 MG & 21 X 10 MG PO TABS: ORAL_TABLET | ORAL | 0 refills | Status: DC

## 2020-04-20 NOTE — Progress Notes (Signed)
PATIENT: Laura Li DOB: 10/03/1940  REASON FOR VISIT: follow up HISTORY FROM: patient  HISTORY OF PRESENT ILLNESS: Today 04/20/20:  Laura Li is an 80 year old female with a history of memory disturbance.  She returns today for follow-up.  She continues to live at [facility].  She is here with her daughter.  Daughter has noticed a significant change in her ability to perform household duties.  She states that she has have a close picked out.  She is often put her clothes on incorrectly.  Everything in her apartment is labeled.  [facility] with now manages all of her medications.  She is gotten lost in the facility.  She does not operate a motor vehicle.  Her mood has remained pleasant.  She tried Aricept in the past but it resulted in itching.  Patient returns today for an evaluation.  Laura Li is an 80 year old female with a history of memory disturbance.  She returns today for follow-up.  She continues to live at [facility] and independent living.  She reports that her memory may be slightly worse.  She is able to complete all ADLs independently.  She does not operate a motor vehicle.  Denies any trouble sleeping.  She no longer does any cooking.  She reports at times she feels sad.  Her daughter reports that she gets confused with where she is living.  The daughter continues to notice some subtle changes with her memory.  She is currently not on any memory medication.  She returns today for an evaluation.  HISTORY 04/12/19:  Laura Li is an 80 year old female with a history of mild cognitive impairment.  She returns today for follow-up. She is here today with her daughter. She feels that her memory is worse. She continues to live [facility]. She completes all ADLs independently. Her daughter helps her with her finances. She eats in the dining room. She does not make any meals. She does not operate a motor vehicle. She reports that there are times that she is slightly  depressed but daughter feels that this is mild. She is currently on Effexor. Daughter reports that there is a certain man that lives across the hallway from the patient and she often gets him confused. She states that there are multiple men name "Collier Salina." The daughter has noticed more confusion with certain things. Patient returns today for an evaluation.   REVIEW OF SYSTEMS: Out of a complete 14 system review of symptoms, the patient complains only of the following symptoms, and all other reviewed systems are negative.  See HPI  ALLERGIES: Allergies  Allergen Reactions  . Dyazide [Hydrochlorothiazide W-Triamterene]     Presumption: drug induced vasculitis  . Niacin Other (See Comments)    flushing  . Penicillins Diarrhea and Nausea And Vomiting    HOME MEDICATIONS: Outpatient Medications Prior to Visit  Medication Sig Dispense Refill  . acetaminophen (TYLENOL) 500 MG tablet Take 500 mg by mouth 2 (two) times daily as needed for mild pain. Reported on 07/07/2015    . atorvastatin (LIPITOR) 20 MG tablet TAKE 1 TABLET(20 MG) BY MOUTH DAILY 90 tablet 0  . B Complex-C-E-Zn (B COMPLEX-C-E-ZINC) tablet Take 1 tablet by mouth daily.    . Calcium-Vitamin D (CALTRATE 600 PLUS-VIT D PO) Take 1 tablet by mouth 2 (two) times daily.    . carboxymethylcellulose (REFRESH PLUS) 0.5 % SOLN 2 drops 3 (three) times daily as needed.    . famotidine (PEPCID) 20 MG tablet Take 1  tablet (20 mg total) by mouth 2 (two) times daily. 60 tablet 5  . fexofenadine (ALLEGRA) 180 MG tablet Take 180 mg by mouth daily as needed for allergies.     [name] ipratropium (ATROVENT) 0.03 % nasal spray Place 2 sprays into the nose every 12 (twelve) hours. 180 mL 3  . Lactobacillus-Inulin (CULTURELLE DIGESTIVE HEALTH PO) Take 1 capsule by mouth daily.    [name] levothyroxine (SYNTHROID) 88 MCG tablet TAKE 1 TABLET(88 MCG) BY MOUTH DAILY 90 tablet 3  . mirabegron ER (MYRBETRIQ) 50 MG TB24 tablet Take 1 tablet (50 mg total) by mouth daily.  30 tablet 5  . Multiple Vitamins-Minerals (CENTRUM SILVER PO) Take 1 tablet by mouth daily.    . nadolol (CORGARD) 20 MG tablet TAKE 1/2 TABLET(10 MG) BY MOUTH DAILY 45 tablet 2  . Propylene Glycol 0.6 % SOLN Apply 1-2 drops to eye daily as needed (dry eyes).     . venlafaxine XR (EFFEXOR-XR) 150 MG 24 hr capsule TAKE 1 CAPSULE(150 MG) BY MOUTH DAILY 90 capsule 3  . vitamin B-12 (CYANOCOBALAMIN) 100 MCG tablet Take 100 mcg by mouth daily.    [name] donepezil (ARICEPT) 5 MG tablet Take 1 tablet (5 mg total) by mouth at bedtime. 30 tablet 5   Facility-Administered Medications Prior to Visit  Medication Dose Route Frequency Provider Last Rate Last Admin  . Bevacizumab (AVASTIN) SOLN 1.25 mg  1.25 mg Intravitreal  Bernarda Caffey, MD   1.25 mg at 10/10/17 1318  . Bevacizumab (AVASTIN) SOLN 1.25 mg  1.25 mg Intravitreal  Bernarda Caffey, MD   1.25 mg at 11/07/17 1206  . Bevacizumab (AVASTIN) SOLN 1.25 mg  1.25 mg Intravitreal  Bernarda Caffey, MD   1.25 mg at 12/05/17 1212  . Bevacizumab (AVASTIN) SOLN 1.25 mg  1.25 mg Intravitreal  Bernarda Caffey, MD   1.25 mg at 01/04/18 2328  . Bevacizumab (AVASTIN) SOLN 1.25 mg  1.25 mg Intravitreal  Bernarda Caffey, MD   1.25 mg at 01/30/18 1230  . Bevacizumab (AVASTIN) SOLN 1.25 mg  1.25 mg Intravitreal  Bernarda Caffey, MD   1.25 mg at 02/27/18 1315  . Bevacizumab (AVASTIN) SOLN 1.25 mg  1.25 mg Intravitreal  Bernarda Caffey, MD   1.25 mg at 03/27/18 1138  . Bevacizumab (AVASTIN) SOLN 1.25 mg  1.25 mg Intravitreal  Bernarda Caffey, MD   1.25 mg at 05/08/18 1231    PAST MEDICAL HISTORY: Past Medical History:  Diagnosis Date  . Anemia    from medicaions  . Arthritis   . Chronic kidney disease    some renal impairment from meds  . Depression    states no depression  . Eczema   . Esophageal stricture   . GERD (gastroesophageal reflux disease)   . Headache   . Heart murmur    states it is benign told 30-40 years ago  . History of DVT (deep vein thrombosis)   . HX:  breast cancer    melanoma  . Hyperlipidemia   . Hypertension   . Hypothyroidism   . Melanoma ([place])   . OA (osteoarthritis)   . Personal history of chemotherapy 2016   Left Breast Cancer  . Personal history of radiation therapy 2016   Left Breast Cancer  . Pneumothorax on right 06/15/2014  . PONV (postoperative nausea and vomiting)   . Wears glasses     PAST SURGICAL HISTORY: Past Surgical History:  Procedure Laterality Date  . APPENDECTOMY  1967  . BREAST EXCISIONAL BIOPSY Left   .  BREAST LUMPECTOMY Left 2016  . BREAST SURGERY  1977   removal of calcified milk gland right breast  . CHEST TUBE INSERTION  06/15/2014  . CHEST TUBE INSERTION Bilateral 06/15/2014   Procedure: CHEST TUBE INSERTION;  Surgeon: Rolm Bookbinder, MD;  Location: [facility];  Service: General;  Laterality: Bilateral;  . COLONOSCOPY    . [place]   right breast  . MASTECTOMY Right 1998   right-nodes out  . MELANOMA EXCISION  2004   right side of face  . MOHS SURGERY  2005   right left-basal cell  . PORT-A-CATH REMOVAL N/A 10/05/2014   Procedure: REMOVAL PORT-A-CATH;  Surgeon: Rolm Bookbinder, MD;  Location: [name];  Service: General;  Laterality: N/A;  . PORTACATH PLACEMENT Left 06/15/2014   Procedure: INSERTION PORT-A-CATH;  Surgeon: Rolm Bookbinder, MD;  Location: [place];  Service: General;  Laterality: Left;  . RADIOACTIVE SEED GUIDED PARTIAL MASTECTOMY WITH AXILLARY SENTINEL LYMPH NODE BIOPSY Left 05/05/2014   Procedure: RADIOACTIVE SEED GUIDED LEFT LUMPECTOMY WITH AXILLARY SENTINEL LYMPH NODE BIOPSY;  Surgeon: Rolm Bookbinder, MD;  Location: [facility];  Service: General;  Laterality: Left;  . TONSILLECTOMY AND ADENOIDECTOMY  1949  . TOTAL ABDOMINAL HYSTERECTOMY  1992   including ovaries    FAMILY HISTORY: Family History  Problem Relation Age of Onset  . Heart disease Father 68       smoker  . Heart attack Father   . Endometrial cancer Mother 73   . Breast cancer Cousin        x 3 cousins with breast cancer (one at 33, one at 32 and one at 18)  . Lung cancer Paternal Aunt        smoker  . Brain cancer Maternal Aunt   . Diabetes Maternal Aunt        ???  . Breast cancer Maternal Aunt 51  . Healthy Sister   . Tuberculosis Maternal Grandfather   . Cervical cancer Paternal Grandmother   . Healthy Daughter   . Colon cancer Neg Hx   . Amblyopia Neg Hx   . Blindness Neg Hx   . Cataracts Neg Hx   . Glaucoma Neg Hx   . Macular degeneration Neg Hx   . Retinal detachment Neg Hx   . Strabismus Neg Hx   . Retinitis pigmentosa Neg Hx     SOCIAL HISTORY: Social History   Socioeconomic History  . Marital status: Widowed    Spouse name: Not on file  . Number of children: Not on file  . Years of education: Not on file  . Highest education level: Not on file  Occupational History  . Occupation: retired  Tobacco Use  . Smoking status: Former Smoker    Packs/day: 0.75    Years: 3.00    Pack years: 2.25    Types: Cigarettes    Quit date: 03/11/1969    Years since quitting: 51.1  . Smokeless tobacco: Never Used  Vaping Use  . Vaping Use: Never used  Substance and Sexual Activity  . Alcohol use: Yes    Alcohol/week: 0.0 standard drinks    Comment: occasional wine  . Drug use: No  . Sexual activity: Not Currently    Birth control/protection: None  Other Topics Concern  . Not on file  Social History Narrative   Widowed 2003. Moved to [place] from [place] in 2004 after loss of husband to be near daughter. 1 daughter. 2 grandkids.  Retired from working at a church      Hobbies: Training and development officer   Social Determinants of Radio broadcast assistant Strain: Not on [employer] Insecurity: Not on file  Transportation Needs: Not on file  Physical Activity: Not on file  Stress: Not on file  Social Connections: Not on file  Intimate Partner Violence: Not on file      PHYSICAL EXAM  Vitals:   04/20/20 1339  BP: 139/75   Pulse: 72  Weight: 147 lb (66.7 kg)  Height: 5\' 7"  (1.702 m)   Body mass index is 23.02 kg/m.   MMSE - Mini Mental State Exam 04/20/2020 10/18/2019 04/12/2019  Not completed: - - -  Orientation to time 3 3 4   Orientation to Place 1 4 5   Registration 3 3 3   Attention/ Calculation 0 5 1  Attention/Calculation-comments - - -  Recall 2 2 3   Language- name 2 objects 2 2 2   Language- repeat 0 1 1  Language- follow 3 step command 3 3 3   Language- read & follow direction 1 1 1   Write a sentence 1 1 1   Copy design 1 1 1   Copy design-comments - - 6 animals  Total score 17 26 25      Generalized: Well developed, in no acute distress   Neurological examination  Mentation: Alert oriented to time, place, history taking. Follows all commands speech and language fluent Cranial nerve II-XII: Pupils were equal round reactive to light. Extraocular movements were full, visual field were full on confrontational test. Head turning and shoulder shrug  were normal and symmetric. Motor: The motor testing reveals 5 over 5 strength of all 4 extremities. Good symmetric motor tone is noted throughout.  Sensory: Sensory testing is intact to soft touch on all 4 extremities. No evidence of extinction is noted.  Coordination: Cerebellar testing reveals good finger-nose-finger and heel-to-shin bilaterally.  Gait and station: Gait is normal.  Reflexes: Deep tendon reflexes are symmetric and normal bilaterally.   DIAGNOSTIC DATA (LABS, IMAGING, TESTING) - I reviewed patient records, labs, notes, testing and imaging myself where available.  Lab Results  Component Value Date   WBC 5.9 11/25/2019   HGB 13.8 11/25/2019   HCT 41.0 11/25/2019   MCV 90.7 11/25/2019   PLT 220 11/25/2019      Component Value Date/Time   NA 139 11/25/2019 1134   NA 141 06/28/2016 1118   K 4.7 11/25/2019 1134   K 4.4 06/28/2016 1118   CL 103 11/25/2019 1134   CO2 29 11/25/2019 1134   CO2 26 06/28/2016 1118   GLUCOSE 104 (H)  11/25/2019 1134   GLUCOSE 106 06/28/2016 1118   GLUCOSE 88 02/06/2006 1059   BUN 18 11/25/2019 1134   BUN 16.2 06/28/2016 1118   CREATININE 1.23 (H) 11/25/2019 1134   CREATININE 1.05 06/24/2017 1108   CREATININE 1.0 06/28/2016 1118   CALCIUM 9.7 11/25/2019 1134   CALCIUM 9.9 06/28/2016 1118   PROT 8.2 (H) 11/25/2019 1134   PROT 7.6 06/28/2016 1118   ALBUMIN 4.2 11/25/2019 1134   ALBUMIN 4.2 06/28/2016 1118   AST 21 11/25/2019 1134   AST 24 06/24/2017 1108   AST 21 06/28/2016 1118   ALT 17 11/25/2019 1134   ALT 21 06/24/2017 1108   ALT 17 06/28/2016 1118   ALKPHOS 90 11/25/2019 1134   ALKPHOS 66 06/28/2016 1118   BILITOT 0.7 11/25/2019 1134   BILITOT 0.4 06/24/2017 1108   BILITOT 0.47 06/28/2016 1118   GFRNONAA  41 (L) 11/25/2019 1134   GFRNONAA 50 (L) 06/24/2017 1108   GFRAA 48 (L) 11/25/2019 1134   GFRAA 57 (L) 06/24/2017 1108   Lab Results  Component Value Date   CHOL 198 02/10/2019   HDL 41.40 02/10/2019   LDLCALC 124 (H) 06/21/2013   LDLDIRECT 116.0 02/10/2019   TRIG 269.0 (H) 02/10/2019   CHOLHDL 5 02/10/2019   No results found for: HGBA1C Lab Results  Component Value Date   VITAMINB12 857 10/16/2017   Lab Results  Component Value Date   TSH 6.01 (H) 08/11/2019      ASSESSMENT AND PLAN 80 y.o. year old female  has a past medical history of Anemia, Arthritis, Chronic kidney disease, Depression, Eczema, Esophageal stricture, GERD (gastroesophageal reflux disease), Headache, Heart murmur, History of DVT (deep vein thrombosis), breast cancer, Hyperlipidemia, Hypertension, Hypothyroidism, Melanoma ([place]), OA (osteoarthritis), Personal history of chemotherapy (2016), Personal history of radiation therapy (2016), Pneumothorax on right (06/15/2014), PONV (postoperative nausea and vomiting), and Wears glasses. here with:  1.  Dementia   MMSE has declined 17 out of 30 previously 26 out of 30  Start Namenda titration pack.  Advised that they need to call at the  beginning of the fourth week for the maintenance dose.  Reviewed potential side effects with the patient and her daughter.  Follow-up in 6 months or sooner if needed   I spent 25 minutes of face-to-face and non-face-to-face time with patient.  This included previsit chart review, lab review, study review, order entry, electronic health record documentation, patient education.  Ward Givens, MSN, NP-C 04/20/2020, 1:43 PM [facility] [address] [place], [place] [address] [phone]

## 2020-04-20 NOTE — Patient Instructions (Signed)
Your Plan:  Continue to monitor symptoms Start Namenda titration pack Call at the beginning of the fourth week so we can send in another script If your symptoms worsen or you develop new symptoms please let us know.     Thank you for coming to see [place] at [facility]. I hope we have been able to provide you high quality care today.  You may receive a patient satisfaction survey over the next few weeks. We would appreciate your feedback and comments so that we may continue to improve ourselves and the health of our patients.  Memantine Tablets What is this medicine? MEMANTINE (MEM an teen) is used to treat dementia caused by Alzheimer's disease. This medicine may be used for other purposes; ask your health care provider or pharmacist if you have questions. COMMON BRAND NAME(S): Namenda What should I tell my health care provider before I take this medicine? They need to know if you have any of these conditions:  difficulty passing urine  kidney disease  liver disease  seizures  an unusual or allergic reaction to memantine, other medicines, foods, dyes, or preservatives  pregnant or trying to get pregnant  breast-feeding How should I use this medicine? Take this medicine by mouth with a glass of water. Follow the directions on the prescription label. You may take this medicine with or without food. Take your doses at regular intervals. Do not take your medicine more often than directed. Continue to take your medicine even if you feel better. Do not stop taking except on the advice of your doctor or health care professional. Talk to your pediatrician regarding the use of this medicine in children. Special care may be needed. Overdosage: If you think you have taken too much of this medicine contact a poison control center or emergency room at once. NOTE: This medicine is only for you. Do not share this medicine with others. What if I miss a dose? If you miss a  dose, take it as soon as you can. If it is almost time for your next dose, take only that dose. Do not take double or extra doses. If you do not take your medicine for several days, contact your health care provider. Your dose may need to be changed. What may interact with this medicine?  acetazolamide  amantadine  cimetidine  dextromethorphan  dofetilide  hydrochlorothiazide  ketamine  metformin  methazolamide  quinidine  ranitidine  sodium bicarbonate  triamterene This list may not describe all possible interactions. Give your health care provider a list of all the medicines, herbs, non-prescription drugs, or dietary supplements you use. Also tell them if you smoke, drink alcohol, or use illegal drugs. Some items may interact with your medicine. What should I watch for while using this medicine? Visit your doctor or health care professional for regular checks on your progress. Check with your doctor or health care professional if there is no improvement in your symptoms or if they get worse. You may get drowsy or dizzy. Do not drive, use machinery, or do anything that needs mental alertness until you know how this drug affects you. Do not stand or sit up quickly, especially if you are an older patient. This reduces the risk of dizzy or fainting spells. Alcohol can make you more drowsy and dizzy. Avoid alcoholic drinks. What side effects may I notice from receiving this medicine? Side effects that you should report to your doctor or health care professional as soon as possible:  allergic  reactions like skin rash, itching or hives, swelling of the face, lips, or tongue  agitation or a feeling of restlessness  depressed mood  dizziness  hallucinations  redness, blistering, peeling or loosening of the skin, including inside the mouth  seizures  vomiting Side effects that usually do not require medical attention (report to your doctor or health care professional if they  continue or are bothersome):  constipation  diarrhea  headache  nausea  trouble sleeping This list may not describe all possible side effects. Call your doctor for medical advice about side effects. You may report side effects to FDA at 1-800-FDA-1088. Where should I keep my medicine? Keep out of the reach of children. Store at room temperature between 15 degrees and 30 degrees C (59 degrees and 86 degrees F). Throw away any unused medicine after the expiration date. NOTE: This sheet is a summary. It may not cover all possible information. If you have questions about this medicine, talk to your doctor, pharmacist, or health care provider.  2021 Elsevier/Gold Standard (2012-12-14 14:10:42)

## 2020-04-21 DIAGNOSIS — R262 Difficulty in walking, not elsewhere classified: Secondary | ICD-10-CM | POA: Diagnosis not present

## 2020-04-21 DIAGNOSIS — R41841 Cognitive communication deficit: Secondary | ICD-10-CM | POA: Diagnosis not present

## 2020-04-21 DIAGNOSIS — M25661 Stiffness of right knee, not elsewhere classified: Secondary | ICD-10-CM | POA: Diagnosis not present

## 2020-04-21 DIAGNOSIS — R278 Other lack of coordination: Secondary | ICD-10-CM | POA: Diagnosis not present

## 2020-04-21 DIAGNOSIS — M25662 Stiffness of left knee, not elsewhere classified: Secondary | ICD-10-CM | POA: Diagnosis not present

## 2020-04-21 DIAGNOSIS — M6281 Muscle weakness (generalized): Secondary | ICD-10-CM | POA: Diagnosis not present

## 2020-04-24 DIAGNOSIS — Z20828 Contact with and (suspected) exposure to other viral communicable diseases: Secondary | ICD-10-CM | POA: Diagnosis not present

## 2020-04-24 DIAGNOSIS — Z1159 Encounter for screening for other viral diseases: Secondary | ICD-10-CM | POA: Diagnosis not present

## 2020-04-25 DIAGNOSIS — R278 Other lack of coordination: Secondary | ICD-10-CM | POA: Diagnosis not present

## 2020-04-25 DIAGNOSIS — R41841 Cognitive communication deficit: Secondary | ICD-10-CM | POA: Diagnosis not present

## 2020-04-25 DIAGNOSIS — M6281 Muscle weakness (generalized): Secondary | ICD-10-CM | POA: Diagnosis not present

## 2020-04-25 DIAGNOSIS — R262 Difficulty in walking, not elsewhere classified: Secondary | ICD-10-CM | POA: Diagnosis not present

## 2020-04-25 DIAGNOSIS — M25662 Stiffness of left knee, not elsewhere classified: Secondary | ICD-10-CM | POA: Diagnosis not present

## 2020-04-25 DIAGNOSIS — M25661 Stiffness of right knee, not elsewhere classified: Secondary | ICD-10-CM | POA: Diagnosis not present

## 2020-04-26 DIAGNOSIS — M25661 Stiffness of right knee, not elsewhere classified: Secondary | ICD-10-CM | POA: Diagnosis not present

## 2020-04-26 DIAGNOSIS — M25662 Stiffness of left knee, not elsewhere classified: Secondary | ICD-10-CM | POA: Diagnosis not present

## 2020-04-26 DIAGNOSIS — R262 Difficulty in walking, not elsewhere classified: Secondary | ICD-10-CM | POA: Diagnosis not present

## 2020-04-26 DIAGNOSIS — R278 Other lack of coordination: Secondary | ICD-10-CM | POA: Diagnosis not present

## 2020-04-26 DIAGNOSIS — M6281 Muscle weakness (generalized): Secondary | ICD-10-CM | POA: Diagnosis not present

## 2020-04-26 DIAGNOSIS — R41841 Cognitive communication deficit: Secondary | ICD-10-CM | POA: Diagnosis not present

## 2020-04-27 DIAGNOSIS — R278 Other lack of coordination: Secondary | ICD-10-CM | POA: Diagnosis not present

## 2020-04-27 DIAGNOSIS — M6281 Muscle weakness (generalized): Secondary | ICD-10-CM | POA: Diagnosis not present

## 2020-04-27 DIAGNOSIS — M25661 Stiffness of right knee, not elsewhere classified: Secondary | ICD-10-CM | POA: Diagnosis not present

## 2020-04-27 DIAGNOSIS — M25662 Stiffness of left knee, not elsewhere classified: Secondary | ICD-10-CM | POA: Diagnosis not present

## 2020-04-27 DIAGNOSIS — R41841 Cognitive communication deficit: Secondary | ICD-10-CM | POA: Diagnosis not present

## 2020-04-27 DIAGNOSIS — R262 Difficulty in walking, not elsewhere classified: Secondary | ICD-10-CM | POA: Diagnosis not present

## 2020-04-28 DIAGNOSIS — R262 Difficulty in walking, not elsewhere classified: Secondary | ICD-10-CM | POA: Diagnosis not present

## 2020-04-28 DIAGNOSIS — H3562 Retinal hemorrhage, left eye: Secondary | ICD-10-CM | POA: Diagnosis not present

## 2020-04-28 DIAGNOSIS — H3509 Other intraretinal microvascular abnormalities: Secondary | ICD-10-CM | POA: Diagnosis not present

## 2020-04-28 DIAGNOSIS — H31012 Macula scars of posterior pole (postinflammatory) (post-traumatic), left eye: Secondary | ICD-10-CM | POA: Diagnosis not present

## 2020-04-28 DIAGNOSIS — M25662 Stiffness of left knee, not elsewhere classified: Secondary | ICD-10-CM | POA: Diagnosis not present

## 2020-04-28 DIAGNOSIS — H524 Presbyopia: Secondary | ICD-10-CM | POA: Diagnosis not present

## 2020-04-28 DIAGNOSIS — R41841 Cognitive communication deficit: Secondary | ICD-10-CM | POA: Diagnosis not present

## 2020-04-28 DIAGNOSIS — M6281 Muscle weakness (generalized): Secondary | ICD-10-CM | POA: Diagnosis not present

## 2020-04-28 DIAGNOSIS — M25661 Stiffness of right knee, not elsewhere classified: Secondary | ICD-10-CM | POA: Diagnosis not present

## 2020-04-28 DIAGNOSIS — H18593 Other hereditary corneal dystrophies, bilateral: Secondary | ICD-10-CM | POA: Diagnosis not present

## 2020-04-28 DIAGNOSIS — Z961 Presence of intraocular lens: Secondary | ICD-10-CM | POA: Diagnosis not present

## 2020-04-28 DIAGNOSIS — R278 Other lack of coordination: Secondary | ICD-10-CM | POA: Diagnosis not present

## 2020-05-01 DIAGNOSIS — R41841 Cognitive communication deficit: Secondary | ICD-10-CM | POA: Diagnosis not present

## 2020-05-01 DIAGNOSIS — R278 Other lack of coordination: Secondary | ICD-10-CM | POA: Diagnosis not present

## 2020-05-01 DIAGNOSIS — M25662 Stiffness of left knee, not elsewhere classified: Secondary | ICD-10-CM | POA: Diagnosis not present

## 2020-05-01 DIAGNOSIS — M25661 Stiffness of right knee, not elsewhere classified: Secondary | ICD-10-CM | POA: Diagnosis not present

## 2020-05-01 DIAGNOSIS — Z20828 Contact with and (suspected) exposure to other viral communicable diseases: Secondary | ICD-10-CM | POA: Diagnosis not present

## 2020-05-01 DIAGNOSIS — R262 Difficulty in walking, not elsewhere classified: Secondary | ICD-10-CM | POA: Diagnosis not present

## 2020-05-01 DIAGNOSIS — M6281 Muscle weakness (generalized): Secondary | ICD-10-CM | POA: Diagnosis not present

## 2020-05-01 DIAGNOSIS — Z1159 Encounter for screening for other viral diseases: Secondary | ICD-10-CM | POA: Diagnosis not present

## 2020-05-02 DIAGNOSIS — M25661 Stiffness of right knee, not elsewhere classified: Secondary | ICD-10-CM | POA: Diagnosis not present

## 2020-05-02 DIAGNOSIS — R278 Other lack of coordination: Secondary | ICD-10-CM | POA: Diagnosis not present

## 2020-05-02 DIAGNOSIS — M25662 Stiffness of left knee, not elsewhere classified: Secondary | ICD-10-CM | POA: Diagnosis not present

## 2020-05-02 DIAGNOSIS — R262 Difficulty in walking, not elsewhere classified: Secondary | ICD-10-CM | POA: Diagnosis not present

## 2020-05-02 DIAGNOSIS — R41841 Cognitive communication deficit: Secondary | ICD-10-CM | POA: Diagnosis not present

## 2020-05-02 DIAGNOSIS — M6281 Muscle weakness (generalized): Secondary | ICD-10-CM | POA: Diagnosis not present

## 2020-05-03 ENCOUNTER — Other Ambulatory Visit: Payer: Self-pay | Admitting: Family Medicine

## 2020-05-03 MED ORDER — ATORVASTATIN CALCIUM 20 MG PO TABS
20.0000 mg | ORAL_TABLET | Freq: Every day | ORAL | 1 refills | Status: DC
Start: 2020-05-03 — End: 2020-05-05

## 2020-05-04 DIAGNOSIS — M6281 Muscle weakness (generalized): Secondary | ICD-10-CM | POA: Diagnosis not present

## 2020-05-04 DIAGNOSIS — R41841 Cognitive communication deficit: Secondary | ICD-10-CM | POA: Diagnosis not present

## 2020-05-04 DIAGNOSIS — M25662 Stiffness of left knee, not elsewhere classified: Secondary | ICD-10-CM | POA: Diagnosis not present

## 2020-05-04 DIAGNOSIS — R262 Difficulty in walking, not elsewhere classified: Secondary | ICD-10-CM | POA: Diagnosis not present

## 2020-05-04 DIAGNOSIS — M25661 Stiffness of right knee, not elsewhere classified: Secondary | ICD-10-CM | POA: Diagnosis not present

## 2020-05-04 DIAGNOSIS — R278 Other lack of coordination: Secondary | ICD-10-CM | POA: Diagnosis not present

## 2020-05-04 NOTE — Progress Notes (Signed)
Phone [phone] In person visit   Subjective:   Laura Li is a 80 y.o. year old very pleasant female patient who presents for/with See problem oriented charting Chief Complaint  Patient presents with  . Hypertension  . Hypothyroidism  . Hyperlipidemia   This visit occurred during the SARS-CoV-2 public health emergency.  Safety protocols were in place, including screening questions prior to the visit, additional usage of staff PPE, and extensive cleaning of exam room while observing appropriate contact time as indicated for disinfecting solutions.   Past Medical History-  Patient Active Problem List   Diagnosis Date Noted  . MCI (mild cognitive impairment) 10/16/2016    Priority: High  . Paroxysmal atrial fibrillation ([place]) 06/04/2016    Priority: High  . Chronic deep vein thrombosis (DVT) ([facility]) 09/01/2015    Priority: High  . History of left breast cancer 04/24/2014    Priority: High  . History of right breast cancer 04/24/2014    Priority: High  . Depression, major, single episode 04/24/2017    Priority: Medium  . Overactive bladder 09/19/2016    Priority: Medium  . Acute lower UTI 05/29/2016    Priority: Medium  . Osteopenia 05/23/2016    Priority: Medium  . History of pneumothorax 06/15/2014    Priority: Medium  . Chronic kidney disease, stage III (moderate) ([place]) 12/09/2012    Priority: Medium  . 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    Priority: Medium  . History of vasculitis 11/09/2012    Priority: Medium  . Hypothyroidism 02/06/2006    Priority: Medium  . Hyperlipidemia 02/06/2006    Priority: Medium  . Essential hypertension 02/06/2006    Priority: Medium  . Localized swelling, mass, or lump of lower extremity 11/05/2014    Priority: Low  . Diverticulitis 08/09/2014    Priority: Low  . Family history of breast cancer 04/29/2014    Priority: Low  . GERD (gastroesophageal reflux disease) 03/31/2014    Priority: Low  . Hot flashes  03/31/2014    Priority: Low  . Osteoarthritis 03/31/2014    Priority: Low  . Allergic rhinitis 02/06/2006    Priority: Low  . Disorder of bone and cartilage 05/23/2017    Medications- reviewed and updated Current Outpatient Medications  Medication Sig Dispense Refill  . acetaminophen (TYLENOL) 500 MG tablet Take 500 mg by mouth 2 (two) times daily as needed for mild pain. Reported on 07/07/2015    . B Complex-C-E-Zn (B COMPLEX-C-E-ZINC) tablet Take 1 tablet by mouth daily.    . Calcium-Vitamin D (CALTRATE 600 PLUS-VIT D PO) Take 1 tablet by mouth 2 (two) times daily.    . carboxymethylcellulose (REFRESH PLUS) 0.5 % SOLN 2 drops 3 (three) times daily as needed.    . famotidine (PEPCID) 20 MG tablet Take 1 tablet (20 mg total) by mouth 2 (two) times daily. 60 tablet 5  . fexofenadine (ALLEGRA) 180 MG tablet Take 180 mg by mouth daily as needed for allergies.     . Lactobacillus-Inulin (CULTURELLE DIGESTIVE HEALTH PO) Take 1 capsule by mouth daily.    [name] levothyroxine (SYNTHROID) 88 MCG tablet TAKE 1 TABLET(88 MCG) BY MOUTH DAILY 90 tablet 3  . mirabegron ER (MYRBETRIQ) 50 MG TB24 tablet Take 1 tablet (50 mg total) by mouth daily. 30 tablet 5  . Multiple Vitamins-Minerals (CENTRUM SILVER PO) Take 1 tablet by mouth daily.    . nadolol (CORGARD) 20 MG tablet TAKE 1/2 TABLET(10 MG) BY MOUTH DAILY 45 tablet 2  . Propylene  Glycol 0.6 % SOLN Apply 1-2 drops to eye daily as needed (dry eyes).     . rosuvastatin (CRESTOR) 10 MG tablet Take 1 tablet (10 mg total) by mouth daily. 90 tablet 3  . venlafaxine XR (EFFEXOR-XR) 150 MG 24 hr capsule TAKE 1 CAPSULE(150 MG) BY MOUTH DAILY 90 capsule 3  . vitamin B-12 (CYANOCOBALAMIN) 100 MCG tablet Take 100 mcg by mouth daily.    [name] ipratropium (ATROVENT) 0.03 % nasal spray Place 2 sprays into the nose every 12 (twelve) hours. (Patient not taking: Reported on 05/05/2020) 180 mL 3  . memantine (NAMENDA TITRATION PAK) tablet pack 5 mg/day for =1 week; 5 mg twice  daily for =1 week; 15 mg/day given in 5 mg and 10 mg separated doses for =1 week; then 10 mg twice daily (Patient not taking: Reported on 05/05/2020) 49 tablet 0   Current Facility-Administered Medications  Medication Dose Route Frequency Provider Last Rate Last Admin  . Bevacizumab (AVASTIN) SOLN 1.25 mg  1.25 mg Intravitreal  Bernarda Caffey, MD   1.25 mg at 10/10/17 1318  . Bevacizumab (AVASTIN) SOLN 1.25 mg  1.25 mg Intravitreal  Bernarda Caffey, MD   1.25 mg at 11/07/17 1206  . Bevacizumab (AVASTIN) SOLN 1.25 mg  1.25 mg Intravitreal  Bernarda Caffey, MD   1.25 mg at 12/05/17 1212  . Bevacizumab (AVASTIN) SOLN 1.25 mg  1.25 mg Intravitreal  Bernarda Caffey, MD   1.25 mg at 01/04/18 2328  . Bevacizumab (AVASTIN) SOLN 1.25 mg  1.25 mg Intravitreal  Bernarda Caffey, MD   1.25 mg at 01/30/18 1230  . Bevacizumab (AVASTIN) SOLN 1.25 mg  1.25 mg Intravitreal  Bernarda Caffey, MD   1.25 mg at 02/27/18 1315  . Bevacizumab (AVASTIN) SOLN 1.25 mg  1.25 mg Intravitreal  Bernarda Caffey, MD   1.25 mg at 03/27/18 1138  . Bevacizumab (AVASTIN) SOLN 1.25 mg  1.25 mg Intravitreal  Bernarda Caffey, MD   1.25 mg at 05/08/18 1231     Objective:  BP 136/82   Pulse 64   Temp 98.1 F (36.7 C) (Temporal)   Ht 5\' 7"  (1.702 m)   Wt 146 lb 9.6 oz (66.5 kg)   SpO2 96%   BMI 22.96 kg/m  Gen: NAD, resting comfortably CV: RRR no murmurs rubs or gallops Lungs: CTAB no crackles, wheeze, rhonchi Ext: no edema Skin: warm, dry Neuro: using rolling walker    Assessment and Plan   % Dementia-follows with Dr. Edwena Felty office-last visit 04/12/2019 with MMSE 25/30 with repeat testing 04/20/20 on 17/30- daughter states typical with what she has been seeing at home.  Patient opted out of medication such as Aricept or Namenda initially 2021. Since that time has had worsening issues. Aricept caused itching. Namenda recently prescribed but has not started.  -she is requiring much more assistance. She requires prompting for almost all of  her tasks. Having to label anything in the home. Having caregivers 4 hours solid everyday in addition to Meal escorts, medication management 4x a day. She can feed herself but has to be prompted to eat. She is receiving occupational, speech, and PT- walking with rolling walker.   % History of both right and left breast cancer followed by Dr. Jana Hakim- today we opted to discontinue mammograms with dementia diagnosis   # Atrial Fibrillation-paroxysmal #Chronic DVT-DVT around 2000 and chronic DVT was noted November 04, 2014 S: Rate Controlled with Nadolol 10mg   Anticoagulated with Xarelto Previously- She Stopped Xarelto after Subretinal Hemorrhage and Has  Decided so Far Not to Restart as of June 2021  Chadsvasc Score of 4  A/P: rate controlled. Off anticoagulation due to subretinal hemorrhage for closing in on a year   #hyperlipidemia-considering more intense control if any evidence of stroke on MRI- eventually she/daughter opted out of this  S: Medication:atorvastatin 20 mg  A/P: with dementia- transition to rosuvastatin 10 mg from atorvastatin 20mg  due to less potential to cross blood brain barrier  #hypothyroidism S: compliant On thyroid medication-Levothyroxine 88MCG- timing may have been off at [place]- for last month has been better  Lab Results  Component Value Date   TSH 6.01 (H) 08/11/2019   A/P: hopefully stable- update TSH today. Continue current meds for now   #hypertension S: medication: Nadolol 10MG  daily-half of 20 mg tablet BP Readings from Last 3 Encounters:  05/05/20 136/82  04/20/20 139/75  11/25/19 (!) 150/86  A/P: Stable. Continue current medications.    #Chronic kidney disease stage III S: GFR is typically in the 40s-50s range- last visit was in 40s-Patient knows to avoid NSAIDs   A/P: hopefully stable- update CMP today.   # Depression S: Medication: Venlafaxine XR 150MG   Depression screen [facility] 2/9 05/05/2020 01/21/2020 08/13/2019  Decreased Interest 0 0 0  Down,  Depressed, Hopeless 0 2 0  PHQ - 2 Score 0 2 0  Altered sleeping 0 2 -  Tired, decreased energy 0 2 -  Change in appetite 0 0 -  Feeling bad or failure about yourself  0 0 -  Trouble concentrating 0 1 -  Moving slowly or fidgety/restless 0 0 -  Suicidal thoughts 0 0 -  PHQ-9 Score 0 7 -  Difficult doing work/chores Not difficult at all - -  Some recent data might be hidden  A/P: reasonable control/full remission- continue current medicine   #overactive bladder S: Medication: Myrbetriq 25 mg increased to 50 mg on August 11, 2019 due to worsening issues. Wearing adult pull up now -UA and cultures have been reassuring.  Caffeine could contribute but she does not want to cut down  A/P:  No recent change other than wearing adult pull ups now- continue current meds    # GERD S:Pepcid 20Mg  twice a day. Occasional breakthrough-recommended trial Tums   -Prefer to avoid PPI if possible due to kidney and memory risks A/P: Stable. Continue current medications.    % Osteopenia-Dr. Cruzita Lederer last visit March 2019-recommend follow-up August 11, 2019 has not been- opts out for now- will try to focus on avoiding falls- glad she is using rolling walker  Recommended follow up: No follow-ups on file. Future Appointments  Date Time Provider [place]  05/23/2020 10:00 AM Bernarda Caffey, MD [facility] None  10/18/2020  3:00 PM Ward Givens, NP [facility] None  11/03/2020  1:20 PM Marin Olp, MD [facility]   Lab/Order associations:   ICD-10-CM   1. Essential hypertension  I10 CBC with Differential/Platelet    Comprehensive metabolic panel    Lipid panel  2. Gastroesophageal reflux disease without esophagitis  K21.9   3. Hypothyroidism, unspecified type  E03.9 TSH  4. Hyperlipidemia, unspecified hyperlipidemia type  E78.5 CBC with Differential/Platelet    Comprehensive metabolic panel    Lipid panel  5. Major depressive disorder with single episode, in full remission ([place])  F32.5   6.  Paroxysmal atrial fibrillation (H[facility]) Chronic I48.0     Meds ordered this encounter  Medications  . rosuvastatin (CRESTOR) 10 MG tablet    Sig: Take 1 tablet (  10 mg total) by mouth daily.    Dispense:  90 tablet    Refill:  3    Return precautions advised.  [name], MD

## 2020-05-04 NOTE — Patient Instructions (Addendum)
Health Maintenance Due  Topic Date Due  . COVID-19 Vaccine (3 - Moderna risk 4-dose series) Will call back with dates. 05/27/2019  . TETANUS/TDAP - we will hold off for now 03/19/2020   transition to rosuvastatin 10 mg from atorvastatin 20mg  due to less potential to cross blood brain barrier  Please stop by lab before you go If you have mychart- we will send your results within 3 business days of [place] receiving them.  If you do not have mychart- we will call you about results within 5 business days of [place] receiving them.  *please also note that you will see labs on mychart as soon as they post. I will later go in and write notes on them- will say "notes from Dr. Yong Channel"  Recommended follow up: Return in about 6 months (around 11/02/2020) for follow up- or sooner if needed.

## 2020-05-05 ENCOUNTER — Encounter: Payer: Self-pay | Admitting: Family Medicine

## 2020-05-05 ENCOUNTER — Ambulatory Visit (INDEPENDENT_AMBULATORY_CARE_PROVIDER_SITE_OTHER): Payer: Medicare Other | Admitting: Family Medicine

## 2020-05-05 ENCOUNTER — Other Ambulatory Visit: Payer: Self-pay

## 2020-05-05 VITALS — BP 136/82 | HR 64 | Temp 98.1°F | Ht 67.0 in | Wt 146.6 lb

## 2020-05-05 DIAGNOSIS — K219 Gastro-esophageal reflux disease without esophagitis: Secondary | ICD-10-CM

## 2020-05-05 DIAGNOSIS — E785 Hyperlipidemia, unspecified: Secondary | ICD-10-CM | POA: Diagnosis not present

## 2020-05-05 DIAGNOSIS — E039 Hypothyroidism, unspecified: Secondary | ICD-10-CM

## 2020-05-05 DIAGNOSIS — F325 Major depressive disorder, single episode, in full remission: Secondary | ICD-10-CM

## 2020-05-05 DIAGNOSIS — I10 Essential (primary) hypertension: Secondary | ICD-10-CM | POA: Diagnosis not present

## 2020-05-05 DIAGNOSIS — I480 Paroxysmal atrial fibrillation: Secondary | ICD-10-CM

## 2020-05-05 LAB — CBC WITH DIFFERENTIAL/PLATELET
Basophils Absolute: 0 10*3/uL (ref 0.0–0.1)
Basophils Relative: 0.5 % (ref 0.0–3.0)
Eosinophils Absolute: 0.2 10*3/uL (ref 0.0–0.7)
Eosinophils Relative: 3.4 % (ref 0.0–5.0)
HCT: 38.1 % (ref 36.0–46.0)
Hemoglobin: 13.3 g/dL (ref 12.0–15.0)
Lymphocytes Relative: 24.2 % (ref 12.0–46.0)
Lymphs Abs: 1.6 10*3/uL (ref 0.7–4.0)
MCHC: 35 g/dL (ref 30.0–36.0)
MCV: 89 fl (ref 78.0–100.0)
Monocytes Absolute: 0.5 10*3/uL (ref 0.1–1.0)
Monocytes Relative: 7.2 % (ref 3.0–12.0)
Neutro Abs: 4.1 10*3/uL (ref 1.4–7.7)
Neutrophils Relative %: 64.7 % (ref 43.0–77.0)
Platelets: 209 10*3/uL (ref 150.0–400.0)
RBC: 4.27 Mil/uL (ref 3.87–5.11)
RDW: 13.8 % (ref 11.5–15.5)
WBC: 6.4 10*3/uL (ref 4.0–10.5)

## 2020-05-05 LAB — COMPREHENSIVE METABOLIC PANEL
ALT: 19 U/L (ref 0–35)
AST: 22 U/L (ref 0–37)
Albumin: 4.3 g/dL (ref 3.5–5.2)
Alkaline Phosphatase: 71 U/L (ref 39–117)
BUN: 18 mg/dL (ref 6–23)
CO2: 32 mEq/L (ref 19–32)
Calcium: 10.3 mg/dL (ref 8.4–10.5)
Chloride: 100 mEq/L (ref 96–112)
Creatinine, Ser: 1.1 mg/dL (ref 0.40–1.20)
GFR: 47.13 mL/min — ABNORMAL LOW (ref >60.00)
Glucose, Bld: 105 mg/dL — ABNORMAL HIGH (ref 70–99)
Potassium: 4.1 mEq/L (ref 3.5–5.1)
Sodium: 137 mEq/L (ref 135–145)
Total Bilirubin: 0.5 mg/dL (ref 0.2–1.2)
Total Protein: 7.6 g/dL (ref 6.0–8.3)

## 2020-05-05 LAB — TSH: TSH: 2.33 u[IU]/mL (ref 0.35–4.50)

## 2020-05-05 LAB — LDL CHOLESTEROL, DIRECT: Direct LDL: 101 mg/dL

## 2020-05-05 LAB — LIPID PANEL
Cholesterol: 212 mg/dL — ABNORMAL HIGH (ref 0–200)
HDL: 40.1 mg/dL (ref >39.00)
Total CHOL/HDL Ratio: 5
Triglycerides: 483 mg/dL — ABNORMAL HIGH (ref 0.0–149.0)

## 2020-05-05 MED ORDER — ROSUVASTATIN CALCIUM 10 MG PO TABS: 10.0000 mg | ORAL_TABLET | Freq: Every day | ORAL | 3 refills | Status: DC

## 2020-05-08 DIAGNOSIS — Z20828 Contact with and (suspected) exposure to other viral communicable diseases: Secondary | ICD-10-CM | POA: Diagnosis not present

## 2020-05-08 DIAGNOSIS — Z1159 Encounter for screening for other viral diseases: Secondary | ICD-10-CM | POA: Diagnosis not present

## 2020-05-09 DIAGNOSIS — R262 Difficulty in walking, not elsewhere classified: Secondary | ICD-10-CM | POA: Diagnosis not present

## 2020-05-09 DIAGNOSIS — M25662 Stiffness of left knee, not elsewhere classified: Secondary | ICD-10-CM | POA: Diagnosis not present

## 2020-05-09 DIAGNOSIS — N3941 Urge incontinence: Secondary | ICD-10-CM | POA: Diagnosis not present

## 2020-05-09 DIAGNOSIS — R488 Other symbolic dysfunctions: Secondary | ICD-10-CM | POA: Diagnosis not present

## 2020-05-09 DIAGNOSIS — M6281 Muscle weakness (generalized): Secondary | ICD-10-CM | POA: Diagnosis not present

## 2020-05-09 DIAGNOSIS — R278 Other lack of coordination: Secondary | ICD-10-CM | POA: Diagnosis not present

## 2020-05-09 DIAGNOSIS — R41841 Cognitive communication deficit: Secondary | ICD-10-CM | POA: Diagnosis not present

## 2020-05-09 DIAGNOSIS — M25661 Stiffness of right knee, not elsewhere classified: Secondary | ICD-10-CM | POA: Diagnosis not present

## 2020-05-15 DIAGNOSIS — R41841 Cognitive communication deficit: Secondary | ICD-10-CM | POA: Diagnosis not present

## 2020-05-15 DIAGNOSIS — M6281 Muscle weakness (generalized): Secondary | ICD-10-CM | POA: Diagnosis not present

## 2020-05-15 DIAGNOSIS — R278 Other lack of coordination: Secondary | ICD-10-CM | POA: Diagnosis not present

## 2020-05-15 DIAGNOSIS — R262 Difficulty in walking, not elsewhere classified: Secondary | ICD-10-CM | POA: Diagnosis not present

## 2020-05-15 DIAGNOSIS — Z1159 Encounter for screening for other viral diseases: Secondary | ICD-10-CM | POA: Diagnosis not present

## 2020-05-15 DIAGNOSIS — M25662 Stiffness of left knee, not elsewhere classified: Secondary | ICD-10-CM | POA: Diagnosis not present

## 2020-05-15 DIAGNOSIS — M25661 Stiffness of right knee, not elsewhere classified: Secondary | ICD-10-CM | POA: Diagnosis not present

## 2020-05-15 DIAGNOSIS — Z20828 Contact with and (suspected) exposure to other viral communicable diseases: Secondary | ICD-10-CM | POA: Diagnosis not present

## 2020-05-15 NOTE — Progress Notes (Shared)
[facility] Note  05/23/2020     CHIEF COMPLAINT Patient presents for No chief complaint on file.   HISTORY OF PRESENT ILLNESS: Laura Li is a 80 y.o. female who presents to the clinic today for:   Pt feels like she is seeing worse OS Referring physician: Marin Olp, MD [place],  [place] [address]  HISTORICAL INFORMATION:   Selected notes from the MEDICAL RECORD NUMBER    CURRENT MEDICATIONS: Current Outpatient Medications (Ophthalmic Drugs)  Medication Sig  . carboxymethylcellulose (REFRESH PLUS) 0.5 % SOLN 2 drops 3 (three) times daily as needed.  [name] Propylene Glycol 0.6 % SOLN Apply 1-2 drops to eye daily as needed (dry eyes).    No current facility-administered medications for this visit. (Ophthalmic Drugs)   Current Outpatient Medications (Other)  Medication Sig  . acetaminophen (TYLENOL) 500 MG tablet Take 500 mg by mouth 2 (two) times daily as needed for mild pain. Reported on 07/07/2015  . B Complex-C-E-Zn (B COMPLEX-C-E-ZINC) tablet Take 1 tablet by mouth daily.  . Calcium-Vitamin D (CALTRATE 600 PLUS-VIT D PO) Take 1 tablet by mouth 2 (two) times daily.  . famotidine (PEPCID) 20 MG tablet Take 1 tablet (20 mg total) by mouth 2 (two) times daily.  . fexofenadine (ALLEGRA) 180 MG tablet Take 180 mg by mouth daily as needed for allergies.   [name] ipratropium (ATROVENT) 0.03 % nasal spray Place 2 sprays into the nose every 12 (twelve) hours. (Patient not taking: Reported on 05/05/2020)  . Lactobacillus-Inulin (CULTURELLE DIGESTIVE HEALTH PO) Take 1 capsule by mouth daily.  [name] levothyroxine (SYNTHROID) 88 MCG tablet TAKE 1 TABLET(88 MCG) BY MOUTH DAILY  . memantine (NAMENDA TITRATION PAK) tablet pack 5 mg/day for =1 week; 5 mg twice daily for =1 week; 15 mg/day given in 5 mg and 10 mg separated doses for =1 week; then 10 mg twice daily (Patient not taking: Reported on 05/05/2020)  . mirabegron ER (MYRBETRIQ) 50 MG TB24  tablet Take 1 tablet (50 mg total) by mouth daily.  . Multiple Vitamins-Minerals (CENTRUM SILVER PO) Take 1 tablet by mouth daily.  . nadolol (CORGARD) 20 MG tablet TAKE 1/2 TABLET(10 MG) BY MOUTH DAILY  . rosuvastatin (CRESTOR) 10 MG tablet Take 1 tablet (10 mg total) by mouth daily.  [name] venlafaxine XR (EFFEXOR-XR) 150 MG 24 hr capsule TAKE 1 CAPSULE(150 MG) BY MOUTH DAILY  . vitamin B-12 (CYANOCOBALAMIN) 100 MCG tablet Take 100 mcg by mouth daily.   Current Facility-Administered Medications (Other)  Medication Route  . Bevacizumab (AVASTIN) SOLN 1.25 mg Intravitreal  . Bevacizumab (AVASTIN) SOLN 1.25 mg Intravitreal  . Bevacizumab (AVASTIN) SOLN 1.25 mg Intravitreal  . Bevacizumab (AVASTIN) SOLN 1.25 mg Intravitreal  . Bevacizumab (AVASTIN) SOLN 1.25 mg Intravitreal  . Bevacizumab (AVASTIN) SOLN 1.25 mg Intravitreal  . Bevacizumab (AVASTIN) SOLN 1.25 mg Intravitreal  . Bevacizumab (AVASTIN) SOLN 1.25 mg Intravitreal      REVIEW OF SYSTEMS:    ALLERGIES Allergies  Allergen Reactions  . Aricept [Donepezil Hcl] Itching  . Dyazide [Hydrochlorothiazide W-Triamterene]     Presumption: drug induced vasculitis  . Niacin Other (See Comments)    flushing  . Penicillins Diarrhea and Nausea And Vomiting    PAST MEDICAL HISTORY Past Medical History:  Diagnosis Date  . Anemia    from medicaions  . Arthritis   . Chronic kidney disease    some renal impairment from meds  . Depression    states  no depression  . Eczema   . Esophageal stricture   . GERD (gastroesophageal reflux disease)   . Headache   . Heart murmur    states it is benign told 30-40 years ago  . History of DVT (deep vein thrombosis)   . HX: breast cancer    melanoma  . Hyperlipidemia   . Hypertension   . Hypothyroidism   . Melanoma ([place])   . OA (osteoarthritis)   . Personal history of chemotherapy 2016   Left Breast Cancer  . Personal history of radiation therapy 2016   Left Breast Cancer  . Pneumothorax  on right 06/15/2014  . PONV (postoperative nausea and vomiting)   . Wears glasses    Past Surgical History:  Procedure Laterality Date  . APPENDECTOMY  1967  . BREAST EXCISIONAL BIOPSY Left   . BREAST LUMPECTOMY Left 2016  . BREAST SURGERY  1977   removal of calcified milk gland right breast  . CHEST TUBE INSERTION  06/15/2014  . CHEST TUBE INSERTION Bilateral 06/15/2014   Procedure: CHEST TUBE INSERTION;  Surgeon: Rolm Bookbinder, MD;  Location: [facility];  Service: General;  Laterality: Bilateral;  . COLONOSCOPY    . [place]   right breast  . MASTECTOMY Right 1998   right-nodes out  . MELANOMA EXCISION  2004   right side of face  . MOHS SURGERY  2005   right left-basal cell  . PORT-A-CATH REMOVAL N/A 10/05/2014   Procedure: REMOVAL PORT-A-CATH;  Surgeon: Rolm Bookbinder, MD;  Location: [facility];  Service: General;  Laterality: N/A;  . PORTACATH PLACEMENT Left 06/15/2014   Procedure: INSERTION PORT-A-CATH;  Surgeon: Rolm Bookbinder, MD;  Location: [place] [facility];  Service: General;  Laterality: Left;  . RADIOACTIVE SEED GUIDED PARTIAL MASTECTOMY WITH AXILLARY SENTINEL LYMPH NODE BIOPSY Left 05/05/2014   Procedure: RADIOACTIVE SEED GUIDED LEFT LUMPECTOMY WITH AXILLARY SENTINEL LYMPH NODE BIOPSY;  Surgeon: Rolm Bookbinder, MD;  Location: [facility];  Service: General;  Laterality: Left;  . TONSILLECTOMY AND ADENOIDECTOMY  1949  . TOTAL ABDOMINAL HYSTERECTOMY  1992   including ovaries    FAMILY HISTORY Family History  Problem Relation Age of Onset  . Heart disease Father 60       smoker  . Heart attack Father   . Endometrial cancer Mother 19  . Breast cancer Cousin        x 3 cousins with breast cancer (one at 70, one at 15 and one at 12)  . Lung cancer Paternal Aunt        smoker  . Brain cancer Maternal Aunt   . Diabetes Maternal Aunt        ???  . Breast cancer Maternal Aunt 51  . Healthy Sister   . Tuberculosis Maternal Grandfather    . Cervical cancer Paternal Grandmother   . Healthy Daughter   . Colon cancer Neg Hx   . Amblyopia Neg Hx   . Blindness Neg Hx   . Cataracts Neg Hx   . Glaucoma Neg Hx   . Macular degeneration Neg Hx   . Retinal detachment Neg Hx   . Strabismus Neg Hx   . Retinitis pigmentosa Neg Hx     SOCIAL HISTORY Social History   Tobacco Use  . Smoking status: Former Smoker    Packs/day: 0.75    Years: 3.00    Pack years: 2.25    Types: Cigarettes    Quit date: 03/11/1969  Years since quitting: 51.2  . Smokeless tobacco: Never Used  Vaping Use  . Vaping Use: Never used  Substance Use Topics  . Alcohol use: Yes    Alcohol/week: 0.0 standard drinks    Comment: occasional wine  . Drug use: No         OPHTHALMIC EXAM:  Not recorded     IMAGING AND PROCEDURES  Imaging and Procedures for @TODAY @           ASSESSMENT/PLAN:  No diagnosis found.  1-4. 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 Yong Channel -- risk factors optimized  - S/P IVA OS #1 (08.02.19), #2 (08.30.19), #3 (09.27.19), #4 (10.25.19), #5 (11.22.19), #6 (12.20.19), #7 (01.17.20), #8 (2.28.2020)  - areas of cleared subretinal heme now atrophic  - BCVA improved from 20/250 to 20/100 today OS  - OCT shows stable outer retinal atrophy  - recommend holding injection today -- pt in agreement  - F/U 6-9 months, sooner prn -- DFE/OCT  5,6. Hypertensive retinopathy OU  - discussed importance of tight BP control  - monitor  7. Pseudophakia OU  - s/p CE/IOL  - beautiful surgery, doing well  -  monitor   Ophthalmic Meds Ordered this visit:  No orders of the defined types were placed in this encounter.      No follow-ups on file.  There are no Patient Instructions on file for this visit.   Explained the diagnoses, plan, and follow up with the patient and they expressed understanding.  Patient expressed understanding of the importance of proper follow up care.   This document serves as a record of services personally performed by Gardiner Sleeper, MD, PhD. It was created on their behalf by Estill Bakes, COT an ophthalmic technician. The creation of this record is the provider's dictation and/or activities during the visit.    Electronically signed by: Estill Bakes, COT 3.7.22 @ 12:28 PM  Abbreviations: M myopia (nearsighted); A astigmatism; H hyperopia (farsighted); P presbyopia; Mrx spectacle prescription;  CTL contact lenses; OD right eye; OS left eye; OU both eyes  XT exotropia; ET esotropia; PEK punctate epithelial keratitis; PEE punctate epithelial erosions; DES dry eye syndrome; MGD meibomian gland dysfunction; ATs artificial tears; PFAT's preservative free artificial tears; [place] nuclear sclerotic cataract; PSC posterior subcapsular cataract; ERM epi-retinal membrane; PVD posterior vitreous detachment; RD retinal detachment; DM diabetes mellitus; DR diabetic retinopathy; NPDR non-proliferative diabetic retinopathy; PDR proliferative diabetic retinopathy; CSME clinically significant macular edema; DME diabetic macular edema; dbh dot blot hemorrhages; CWS cotton wool spot; POAG primary open angle glaucoma; C/D cup-to-disc ratio; HVF humphrey visual field; GVF goldmann visual field; OCT optical coherence tomography; IOP intraocular pressure; BRVO Branch retinal vein occlusion; CRVO central retinal vein occlusion; CRAO central retinal artery occlusion; BRAO branch retinal artery occlusion; RT retinal tear; SB scleral buckle; PPV pars plana vitrectomy; VH Vitreous hemorrhage; PRP  panretinal laser photocoagulation; IVK intravitreal kenalog; VMT vitreomacular traction; MH Macular hole;  NVD neovascularization of the disc; NVE neovascularization elsewhere; AREDS age related eye disease study; ARMD age related macular degeneration; POAG primary open angle glaucoma; EBMD epithelial/anterior basement membrane  dystrophy; ACIOL anterior chamber intraocular lens; IOL intraocular lens; PCIOL posterior chamber intraocular lens; Phaco/IOL phacoemulsification with intraocular lens placement; [place] photorefractive keratectomy; LASIK laser assisted in situ keratomileusis; HTN hypertension; DM diabetes mellitus; COPD chronic obstructive pulmonary disease

## 2020-05-16 DIAGNOSIS — R262 Difficulty in walking, not elsewhere classified: Secondary | ICD-10-CM | POA: Diagnosis not present

## 2020-05-16 DIAGNOSIS — M6281 Muscle weakness (generalized): Secondary | ICD-10-CM | POA: Diagnosis not present

## 2020-05-16 DIAGNOSIS — M25662 Stiffness of left knee, not elsewhere classified: Secondary | ICD-10-CM | POA: Diagnosis not present

## 2020-05-16 DIAGNOSIS — M25661 Stiffness of right knee, not elsewhere classified: Secondary | ICD-10-CM | POA: Diagnosis not present

## 2020-05-16 DIAGNOSIS — R278 Other lack of coordination: Secondary | ICD-10-CM | POA: Diagnosis not present

## 2020-05-16 DIAGNOSIS — R41841 Cognitive communication deficit: Secondary | ICD-10-CM | POA: Diagnosis not present

## 2020-05-17 ENCOUNTER — Encounter: Payer: Self-pay | Admitting: Adult Health

## 2020-05-17 ENCOUNTER — Telehealth: Payer: Self-pay

## 2020-05-17 ENCOUNTER — Telehealth: Payer: Self-pay | Admitting: Adult Health

## 2020-05-17 DIAGNOSIS — R262 Difficulty in walking, not elsewhere classified: Secondary | ICD-10-CM | POA: Diagnosis not present

## 2020-05-17 DIAGNOSIS — M25661 Stiffness of right knee, not elsewhere classified: Secondary | ICD-10-CM | POA: Diagnosis not present

## 2020-05-17 DIAGNOSIS — R41841 Cognitive communication deficit: Secondary | ICD-10-CM | POA: Diagnosis not present

## 2020-05-17 DIAGNOSIS — M6281 Muscle weakness (generalized): Secondary | ICD-10-CM | POA: Diagnosis not present

## 2020-05-17 DIAGNOSIS — R278 Other lack of coordination: Secondary | ICD-10-CM | POA: Diagnosis not present

## 2020-05-17 DIAGNOSIS — M25662 Stiffness of left knee, not elsewhere classified: Secondary | ICD-10-CM | POA: Diagnosis not present

## 2020-05-17 NOTE — Telephone Encounter (Signed)
Pt.'s daughter states that pt is having some large side effects to a medication, and is wanting to speak with a CMA.

## 2020-05-17 NOTE — Telephone Encounter (Signed)
Patient started taking Namenda on Friday. Patient is very agitated and her confusion is worst than in the beginning. The patient is complaining that she doesn't feel well at all. The patients daughter wants to know does she really have to take the medication. If she doesn't have to take it do the have to wing her off the medication or can she stop cold [place]

## 2020-05-17 NOTE — Telephone Encounter (Signed)
Pt's daughter [name] on [place] called wanting to know if she can discuss the message she sent on mychart with provider or RN regarding the pt's memantine (NAMENDA TITRATION PAK) tablet pack Please advise.

## 2020-05-17 NOTE — Telephone Encounter (Signed)
I called daughter and the patient, Laura Li .  Since this last Friday she has been taking memantine 5 mg daily.  Has noted increased confusion per staff members at [facility], as well as her daughter.  She asked about stopping the medication.  I relayed that it is okay since she is taking 5 mg p.o. daily at this point.  She will call us back if there has any more questions or concerns.  Hopefully the patient will return back to baseline soon but will let us know.  I relayed that if acute confusion could be underlying s/s infection. Will monitor. I will relay to MM/NP she appreciated call back.

## 2020-05-18 DIAGNOSIS — M25661 Stiffness of right knee, not elsewhere classified: Secondary | ICD-10-CM | POA: Diagnosis not present

## 2020-05-18 DIAGNOSIS — R262 Difficulty in walking, not elsewhere classified: Secondary | ICD-10-CM | POA: Diagnosis not present

## 2020-05-18 DIAGNOSIS — R41841 Cognitive communication deficit: Secondary | ICD-10-CM | POA: Diagnosis not present

## 2020-05-18 DIAGNOSIS — M6281 Muscle weakness (generalized): Secondary | ICD-10-CM | POA: Diagnosis not present

## 2020-05-18 DIAGNOSIS — R278 Other lack of coordination: Secondary | ICD-10-CM | POA: Diagnosis not present

## 2020-05-18 DIAGNOSIS — M25662 Stiffness of left knee, not elsewhere classified: Secondary | ICD-10-CM | POA: Diagnosis not present

## 2020-05-18 NOTE — Telephone Encounter (Signed)
Called and lm on pt daughter vm.

## 2020-05-18 NOTE — Telephone Encounter (Signed)
Please take patient off medication immediately and list as an allergy please.  If your symptoms do not improve off the medicine within several days please let me know

## 2020-05-18 NOTE — Telephone Encounter (Signed)
noted 

## 2020-05-22 DIAGNOSIS — Z1159 Encounter for screening for other viral diseases: Secondary | ICD-10-CM | POA: Diagnosis not present

## 2020-05-22 DIAGNOSIS — Z20828 Contact with and (suspected) exposure to other viral communicable diseases: Secondary | ICD-10-CM | POA: Diagnosis not present

## 2020-05-23 ENCOUNTER — Emergency Department (HOSPITAL_COMMUNITY)
Admission: EM | Admit: 2020-05-23 | Discharge: 2020-05-24 | Disposition: A | Discharge status: Home / Self Care | Payer: Medicare Other | Attending: Emergency Medicine | Admitting: Emergency Medicine

## 2020-05-23 ENCOUNTER — Other Ambulatory Visit: Payer: Self-pay

## 2020-05-23 ENCOUNTER — Encounter (HOSPITAL_COMMUNITY): Payer: Self-pay | Admitting: Emergency Medicine

## 2020-05-23 ENCOUNTER — Emergency Department (HOSPITAL_COMMUNITY): Payer: Medicare Other

## 2020-05-23 ENCOUNTER — Encounter (INDEPENDENT_AMBULATORY_CARE_PROVIDER_SITE_OTHER): Payer: Medicare Other | Admitting: Ophthalmology

## 2020-05-23 DIAGNOSIS — Z87891 Personal history of nicotine dependence: Secondary | ICD-10-CM | POA: Insufficient documentation

## 2020-05-23 DIAGNOSIS — Z853 Personal history of malignant neoplasm of breast: Secondary | ICD-10-CM | POA: Insufficient documentation

## 2020-05-23 DIAGNOSIS — I10 Essential (primary) hypertension: Secondary | ICD-10-CM

## 2020-05-23 DIAGNOSIS — N183 Chronic kidney disease, stage 3 unspecified: Secondary | ICD-10-CM | POA: Insufficient documentation

## 2020-05-23 DIAGNOSIS — I129 Hypertensive chronic kidney disease with stage 1 through stage 4 chronic kidney disease, or unspecified chronic kidney disease: Secondary | ICD-10-CM | POA: Diagnosis not present

## 2020-05-23 DIAGNOSIS — W19XXXA Unspecified fall, initial encounter: Secondary | ICD-10-CM | POA: Diagnosis not present

## 2020-05-23 DIAGNOSIS — H35033 Hypertensive retinopathy, bilateral: Secondary | ICD-10-CM

## 2020-05-23 DIAGNOSIS — E039 Hypothyroidism, unspecified: Secondary | ICD-10-CM | POA: Diagnosis not present

## 2020-05-23 DIAGNOSIS — G319 Degenerative disease of nervous system, unspecified: Secondary | ICD-10-CM | POA: Diagnosis not present

## 2020-05-23 DIAGNOSIS — Z9012 Acquired absence of left breast and nipple: Secondary | ICD-10-CM | POA: Diagnosis not present

## 2020-05-23 DIAGNOSIS — F0390 Unspecified dementia without behavioral disturbance: Secondary | ICD-10-CM | POA: Diagnosis not present

## 2020-05-23 DIAGNOSIS — Z961 Presence of intraocular lens: Secondary | ICD-10-CM

## 2020-05-23 DIAGNOSIS — Z79899 Other long term (current) drug therapy: Secondary | ICD-10-CM | POA: Diagnosis not present

## 2020-05-23 DIAGNOSIS — H35012 Changes in retinal vascular appearance, left eye: Secondary | ICD-10-CM

## 2020-05-23 DIAGNOSIS — H3581 Retinal edema: Secondary | ICD-10-CM

## 2020-05-23 DIAGNOSIS — R404 Transient alteration of awareness: Secondary | ICD-10-CM | POA: Diagnosis not present

## 2020-05-23 DIAGNOSIS — R531 Weakness: Secondary | ICD-10-CM | POA: Insufficient documentation

## 2020-05-23 DIAGNOSIS — H3562 Retinal hemorrhage, left eye: Secondary | ICD-10-CM

## 2020-05-23 DIAGNOSIS — H3500 Unspecified background retinopathy: Secondary | ICD-10-CM

## 2020-05-23 DIAGNOSIS — R9082 White matter disease, unspecified: Secondary | ICD-10-CM | POA: Diagnosis not present

## 2020-05-23 LAB — URINALYSIS, ROUTINE W REFLEX MICROSCOPIC
Bacteria, UA: NONE SEEN
Bilirubin Urine: NEGATIVE
Glucose, UA: NEGATIVE mg/dL
Hgb urine dipstick: NEGATIVE
Ketones, ur: 5 mg/dL — AB
Nitrite: NEGATIVE
Protein, ur: NEGATIVE mg/dL
Specific Gravity, Urine: 1.005 (ref 1.005–1.030)
pH: 5 (ref 5.0–8.0)

## 2020-05-23 LAB — COMPREHENSIVE METABOLIC PANEL
ALT: 22 U/L (ref 0–44)
AST: 27 U/L (ref 15–41)
Albumin: 4.8 g/dL (ref 3.5–5.0)
Alkaline Phosphatase: 69 U/L (ref 38–126)
Anion gap: 7 (ref 5–15)
BUN: 21 mg/dL (ref 8–23)
CO2: 33 mmol/L — ABNORMAL HIGH (ref 22–32)
Calcium: 10.4 mg/dL — ABNORMAL HIGH (ref 8.9–10.3)
Chloride: 97 mmol/L — ABNORMAL LOW (ref 98–111)
Creatinine, Ser: 1.16 mg/dL — ABNORMAL HIGH (ref 0.44–1.00)
GFR, Estimated: 47 mL/min — ABNORMAL LOW (ref >60)
Glucose, Bld: 91 mg/dL (ref 70–99)
Potassium: 3.6 mmol/L (ref 3.5–5.1)
Sodium: 137 mmol/L (ref 135–145)
Total Bilirubin: 1.1 mg/dL (ref 0.3–1.2)
Total Protein: 8.5 g/dL — ABNORMAL HIGH (ref 6.5–8.1)

## 2020-05-23 LAB — CBC WITH DIFFERENTIAL/PLATELET
Abs Immature Granulocytes: 0.02 10*3/uL (ref 0.00–0.07)
Basophils Absolute: 0 10*3/uL (ref 0.0–0.1)
Basophils Relative: 0 %
Eosinophils Absolute: 0.1 10*3/uL (ref 0.0–0.5)
Eosinophils Relative: 2 %
HCT: 41.3 % (ref 36.0–46.0)
Hemoglobin: 14.7 g/dL (ref 12.0–15.0)
Immature Granulocytes: 0 %
Lymphocytes Relative: 20 %
Lymphs Abs: 1.3 10*3/uL (ref 0.7–4.0)
MCH: 31.5 pg (ref 26.0–34.0)
MCHC: 35.6 g/dL (ref 30.0–36.0)
MCV: 88.6 fL (ref 80.0–100.0)
Monocytes Absolute: 0.5 10*3/uL (ref 0.1–1.0)
Monocytes Relative: 8 %
Neutro Abs: 4.7 10*3/uL (ref 1.7–7.7)
Neutrophils Relative %: 70 %
Platelets: 257 10*3/uL (ref 150–400)
RBC: 4.66 MIL/uL (ref 3.87–5.11)
RDW: 13 % (ref 11.5–15.5)
WBC: 6.7 10*3/uL (ref 4.0–10.5)
nRBC: 0 % (ref 0.0–0.2)

## 2020-05-23 NOTE — ED Notes (Signed)
Report called to [place]

## 2020-05-23 NOTE — ED Provider Notes (Signed)
[facility] DEPT Provider Note   CSN: [id number] Arrival date & time: 05/23/20  1932     History Chief Complaint  Patient presents with  . Fall    [address] Rosana Hoes Vantil is a 80 y.o. female.  80yo F w/ PMH including dementia, CKD, A fib, anemai, HTN, hypothyroidism, breast CA, HLD who p/w fall. Nursing facility reports that patient was found on the ground around dinner time by staff after apparent fall, unwitnessed. Pt does not recall details of the event. She denies any pain. She denies breathing problems or nausea. Facility reports some weakness over the past week.   LEVEL 5 CAVEAT DUE TO DEMENTIA  The history is provided by the nursing home and the EMS personnel.  Fall       Past Medical History:  Diagnosis Date  . Anemia    from medicaions  . Arthritis   . Chronic kidney disease    some renal impairment from meds  . Depression    states no depression  . Eczema   . Esophageal stricture   . GERD (gastroesophageal reflux disease)   . Headache   . Heart murmur    states it is benign told 30-40 years ago  . History of DVT (deep vein thrombosis)   . HX: breast cancer    melanoma  . Hyperlipidemia   . Hypertension   . Hypothyroidism   . Melanoma ([place])   . OA (osteoarthritis)   . Personal history of chemotherapy 2016   Left Breast Cancer  . Personal history of radiation therapy 2016   Left Breast Cancer  . Pneumothorax on right 06/15/2014  . PONV (postoperative nausea and vomiting)   . Wears glasses     Patient Active Problem List   Diagnosis Date Noted  . Disorder of bone and cartilage 05/23/2017  . Depression, major, single episode 04/24/2017  . MCI (mild cognitive impairment) 10/16/2016  . Overactive bladder 09/19/2016  . Paroxysmal atrial fibrillation ([place]) 06/04/2016  . Acute lower UTI 05/29/2016  . Osteopenia 05/23/2016  . Chronic deep vein thrombosis (DVT) ([place]) 09/01/2015  . Localized swelling, mass, or lump of lower  extremity 11/05/2014  . Diverticulitis 08/09/2014  . History of pneumothorax 06/15/2014  . Family history of breast cancer 04/29/2014  . History of left breast cancer 04/24/2014  . History of right breast cancer 04/24/2014  . GERD (gastroesophageal reflux disease) 03/31/2014  . Hot flashes 03/31/2014  . Osteoarthritis 03/31/2014  . Chronic kidney disease, stage III (moderate) ([place]) 12/09/2012  . 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  . History of vasculitis 11/09/2012  . Hypothyroidism 02/06/2006  . Hyperlipidemia 02/06/2006  . Essential hypertension 02/06/2006  . Allergic rhinitis 02/06/2006    Past Surgical History:  Procedure Laterality Date  . APPENDECTOMY  1967  . BREAST EXCISIONAL BIOPSY Left   . BREAST LUMPECTOMY Left 2016  . BREAST SURGERY  1977   removal of calcified milk gland right breast  . CHEST TUBE INSERTION  06/15/2014  . CHEST TUBE INSERTION Bilateral 06/15/2014   Procedure: CHEST TUBE INSERTION;  Surgeon: Rolm Bookbinder, MD;  Location: [facility];  Service: General;  Laterality: Bilateral;  . COLONOSCOPY    . [place]   right breast  . MASTECTOMY Right 1998   right-nodes out  . MELANOMA EXCISION  2004   right side of face  . MOHS SURGERY  2005   right left-basal cell  . PORT-A-CATH REMOVAL N/A 10/05/2014   Procedure: REMOVAL PORT-A-CATH;  Surgeon: Rolm Bookbinder, MD;  Location: [facility];  Service: General;  Laterality: N/A;  . PORTACATH PLACEMENT Left 06/15/2014   Procedure: INSERTION PORT-A-CATH;  Surgeon: Rolm Bookbinder, MD;  Location: [facility];  Service: General;  Laterality: Left;  . RADIOACTIVE SEED GUIDED PARTIAL MASTECTOMY WITH AXILLARY SENTINEL LYMPH NODE BIOPSY Left 05/05/2014   Procedure: RADIOACTIVE SEED GUIDED LEFT LUMPECTOMY WITH AXILLARY SENTINEL LYMPH NODE BIOPSY;  Surgeon: Rolm Bookbinder, MD;  Location: [facility];  Service: General;  Laterality: Left;  . TONSILLECTOMY AND ADENOIDECTOMY  1949  .  TOTAL ABDOMINAL HYSTERECTOMY  1992   including ovaries     OB History    Gravida  1   Para  1   Term  1   Preterm      AB      Living  1     SAB      IAB      Ectopic      Multiple      Live Births  1           Family History  Problem Relation Age of Onset  . Heart disease Father 82       smoker  . Heart attack Father   . Endometrial cancer Mother 17  . Breast cancer Cousin        x 3 cousins with breast cancer (one at 44, one at 58 and one at 52)  . Lung cancer Paternal Aunt        smoker  . Brain cancer Maternal Aunt   . Diabetes Maternal Aunt        ???  . Breast cancer Maternal Aunt 51  . Healthy Sister   . Tuberculosis Maternal Grandfather   . Cervical cancer Paternal Grandmother   . Healthy Daughter   . Colon cancer Neg Hx   . Amblyopia Neg Hx   . Blindness Neg Hx   . Cataracts Neg Hx   . Glaucoma Neg Hx   . Macular degeneration Neg Hx   . Retinal detachment Neg Hx   . Strabismus Neg Hx   . Retinitis pigmentosa Neg Hx     Social History   Tobacco Use  . Smoking status: Former Smoker    Packs/day: 0.75    Years: 3.00    Pack years: 2.25    Types: Cigarettes    Quit date: 03/11/1969    Years since quitting: 51.2  . Smokeless tobacco: Never Used  Vaping Use  . Vaping Use: Never used  Substance Use Topics  . Alcohol use: Yes    Alcohol/week: 0.0 standard drinks    Comment: occasional wine  . Drug use: No    Home Medications Prior to Admission medications   Medication Sig Start Date End Date Taking? Authorizing Provider  acetaminophen (TYLENOL) 500 MG tablet Take 500 mg by mouth daily as needed for mild pain.   Yes [provider]  atorvastatin (LIPITOR) 20 MG tablet Take 20 mg by mouth daily.   Yes [provider]  B Complex-C-E-Zn (B COMPLEX-C-E-ZINC) tablet Take 1 tablet by mouth daily.   Yes [provider]  Calcium-Vitamin D (CALTRATE 600 PLUS-VIT D PO) Take 1 tablet by mouth 2 (two) times daily.    Yes [provider]  carboxymethylcellulose (REFRESH PLUS) 0.5 % SOLN 2 drops 3 (three) times daily as needed (dry eyes).   Yes [provider]  famotidine (PEPCID) 20 MG tablet Take 1 tablet (20 mg total)  by mouth 2 (two) times daily. 02/10/19  Yes Marin Olp, MD  Lactobacillus-Inulin ([place] PO) Take 1 capsule by mouth daily.   Yes [provider]  levothyroxine (SYNTHROID) 88 MCG tablet TAKE 1 TABLET(88 MCG) BY MOUTH DAILY Patient taking differently: Take 88 mcg by mouth daily before breakfast. 10/22/19  Yes Marin Olp, MD  mirabegron ER (MYRBETRIQ) 50 MG TB24 tablet Take 1 tablet (50 mg total) by mouth daily. 08/11/19  Yes Marin Olp, MD  Multiple Vitamins-Minerals (CENTRUM SILVER PO) Take 1 tablet by mouth daily.   Yes [provider]  nadolol (CORGARD) 20 MG tablet TAKE 1/2 TABLET(10 MG) BY MOUTH DAILY Patient taking differently: Take 10 mg by mouth daily. 12/31/19  Yes Marin Olp, MD  venlafaxine XR (EFFEXOR-XR) 150 MG 24 hr capsule TAKE 1 CAPSULE(150 MG) BY MOUTH DAILY Patient taking differently: Take 150 mg by mouth daily with breakfast. 04/18/20  Yes Marin Olp, MD  vitamin B-12 (CYANOCOBALAMIN) 100 MCG tablet Take 100 mcg by mouth daily.   Yes [provider]  ipratropium (ATROVENT) 0.03 % nasal spray Place 2 sprays into the nose every 12 (twelve) hours. Patient not taking: No sig reported 04/02/18   Marin Olp, MD  memantine [facility] TITRATION PAK) tablet pack 5 mg/day for =1 week; 5 mg twice daily for =1 week; 15 mg/day given in 5 mg and 10 mg separated doses for =1 week; then 10 mg twice daily Patient not taking: No sig reported 04/20/20   Ward Givens, NP  rosuvastatin (CRESTOR) 10 MG tablet Take 1 tablet (10 mg total) by mouth daily. 05/05/20   Marin Olp, MD    Allergies    Aricept Reather Littler hcl], Dyazide [hydrochlorothiazide w-triamterene], Namenda [memantine], Niacin,  and Penicillins  Review of Systems   Review of Systems  Unable to perform ROS: Dementia    Physical Exam Updated Vital Signs BP 135/72   Pulse 61   Temp 98.3 F (36.8 C) (Oral)   Resp 10   Ht 5\' 7"  (1.702 m)   Wt 66.5 kg   SpO2 100%   BMI 22.96 kg/m   Physical Exam Constitutional:      General: She is not in acute distress.    Appearance: Normal appearance.  HENT:     Head: Normocephalic and atraumatic.     Nose: Nose normal.     Mouth/Throat:     Mouth: Mucous membranes are moist.  Eyes:     Conjunctiva/sclera: Conjunctivae normal.     Pupils: Pupils are equal, round, and reactive to light.  Neck:     Comments: In c-collar Cardiovascular:     Rate and Rhythm: Normal rate and regular rhythm.     Heart sounds: Normal heart sounds. No murmur heard.   Pulmonary:     Effort: Pulmonary effort is normal.     Breath sounds: Normal breath sounds.  Abdominal:     General: Abdomen is flat. Bowel sounds are normal. There is no distension.     Palpations: Abdomen is soft.     Tenderness: There is no abdominal tenderness.  Musculoskeletal:        General: No tenderness or signs of injury. Normal range of motion.     Right lower leg: No edema.     Left lower leg: No edema.  Skin:    General: Skin is warm and dry.  Neurological:     Mental Status: She is alert.  Comments: Oriented x 1, fluent speech, moving all 4 extremities equally, following basic commands  Psychiatric:     Comments: Calm, cooperative     ED Results / Procedures / Treatments   Labs (all labs ordered are listed, but only abnormal results are displayed) Labs Reviewed  COMPREHENSIVE METABOLIC PANEL - Abnormal; Notable for the following components:      Result Value   Chloride 97 (*)    CO2 33 (*)    Creatinine, Ser 1.16 (*)    Calcium 10.4 (*)    Total Protein 8.5 (*)    GFR, Estimated 47 (*)    All other components within normal limits  URINALYSIS, ROUTINE W REFLEX MICROSCOPIC - Abnormal;  Notable for the following components:   Color, Urine STRAW (*)    Ketones, ur 5 (*)    Leukocytes,Ua SMALL (*)    All other components within normal limits  CBC WITH DIFFERENTIAL/PLATELET    EKG EKG Interpretation  Date/Time:  Tuesday May 23 2020 20:43:50 EDT Ventricular Rate:  63 PR Interval:    QRS Duration: 122 QT Interval:  471 QTC Calculation: 483 R Axis:   -56 Text Interpretation: Sinus rhythm IVCD, consider atypical RBBB LVH with IVCD and secondary repol abnrm IVCD new from previous 4 years ago Confirmed by Theotis Burrow [phone]) on 05/23/2020 9:13:47 PM   Radiology CT Head Wo Contrast  Result Date: 05/23/2020 CLINICAL DATA:  Unwitnessed fall. EXAM: CT HEAD WITHOUT CONTRAST CT CERVICAL SPINE WITHOUT CONTRAST TECHNIQUE: Multidetector CT imaging of the head and cervical spine was performed following the standard protocol without intravenous contrast. Multiplanar CT image reconstructions of the cervical spine were also generated. COMPARISON:  Head CT 05/28/2016. FINDINGS: CT HEAD FINDINGS Brain: There is no evidence for acute hemorrhage, hydrocephalus, mass lesion, or abnormal extra-axial fluid collection. No definite CT evidence for acute infarction. Diffuse loss of parenchymal volume is consistent with atrophy. Patchy low attenuation in the deep hemispheric and periventricular white matter is nonspecific, but likely reflects chronic microvascular ischemic demyelination. 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 Vascular: No hyperdense vessel or unexpected calcification. Skull: No evidence for fracture. No worrisome lytic or sclerotic lesion. Sinuses/Orbits: The visualized paranasal sinuses and mastoid air cells are clear. Visualized portions of the globes and intraorbital fat are unremarkable. Other: None. CT CERVICAL SPINE FINDINGS Alignment: Normal. Skull base and vertebrae: No acute fracture. No primary bone lesion or focal pathologic process. Soft tissues and spinal canal: No  prevertebral fluid or swelling. No visible canal hematoma. Disc levels: Loss of disc height at C5-6 and C6-7 is compatible with degenerative disc disease. Upper chest: Negative. Other: None. IMPRESSION: 1. No acute intracranial abnormality. Atrophy with chronic small vessel white matter ischemic disease. 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 2. Degenerative changes in the cervical spine without evidence for an acute fracture. Electronically Signed   By: Misty Stanley M.D.   On: 05/23/2020 21:19   CT Cervical Spine Wo Contrast  Result Date: 05/23/2020 CLINICAL DATA:  Unwitnessed fall. EXAM: CT HEAD WITHOUT CONTRAST CT CERVICAL SPINE WITHOUT CONTRAST TECHNIQUE: Multidetector CT imaging of the head and cervical spine was performed following the standard protocol without intravenous contrast. Multiplanar CT image reconstructions of the cervical spine were also generated. COMPARISON:  Head CT 05/28/2016. FINDINGS: CT HEAD FINDINGS Brain: There is no evidence for acute hemorrhage, hydrocephalus, mass lesion, or abnormal extra-axial fluid collection. No definite CT evidence for acute infarction. Diffuse loss of parenchymal volume is consistent with atrophy. Patchy low attenuation in the  deep hemispheric and periventricular white matter is nonspecific, but likely reflects chronic microvascular ischemic demyelination. 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 Vascular: No hyperdense vessel or unexpected calcification. Skull: No evidence for fracture. No worrisome lytic or sclerotic lesion. Sinuses/Orbits: The visualized paranasal sinuses and mastoid air cells are clear. Visualized portions of the globes and intraorbital fat are unremarkable. Other: None. CT CERVICAL SPINE FINDINGS Alignment: Normal. Skull base and vertebrae: No acute fracture. No primary bone lesion or focal pathologic process. Soft tissues and spinal canal: No prevertebral fluid or swelling. No visible canal hematoma. Disc levels: Loss of disc height at  C5-6 and C6-7 is compatible with degenerative disc disease. Upper chest: Negative. Other: None. IMPRESSION: 1. No acute intracranial abnormality. Atrophy with chronic small vessel white matter ischemic disease. 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 2. Degenerative changes in the cervical spine without evidence for an acute fracture. Electronically Signed   By: Misty Stanley M.D.   On: 05/23/2020 21:19    Procedures Procedures   Medications Ordered in ED Medications - No data to display  ED Course  I have reviewed the triage vital signs and the nursing notes.  Pertinent labs & imaging results that were available during my care of the patient were reviewed by me and considered in my medical decision making (see chart for details).    MDM Rules/Calculators/A&P                          Pt alert, comfortable on exam, reassuring VS, no external signs of trauma, no focal areas of pain.  Head and C-spine CT negative for acute injury, CBC normal and CMP reassuring.  UA not suggestive of infection.  Patient alert and comfortable on reassessment.  Daughter at bedside.  Discussed reassuring work-up and imaging results.  Daughter plans to have patient follow-up with PCP.  I extensively reviewed return precautions and daughter voiced understanding. Final Clinical Impression(s) / ED Diagnoses Final diagnoses:  Fall, initial encounter    Rx / DC Orders ED Discharge Orders    None       Madell Heino, Wenda Overland, MD 05/23/20 2340

## 2020-05-23 NOTE — ED Triage Notes (Signed)
Pt arrived via EMS from [facility]. The facility told EMS that she had an unwitnessed fall. Pt states that she lay down and did not fall. Pt has hx of dementia. Pt is A&Ox1 at baseline. Pt was found on the ground at dinner time by staff. Pt denies pain. Facility reports that pt has been weaker in the past week.

## 2020-05-24 DIAGNOSIS — R278 Other lack of coordination: Secondary | ICD-10-CM | POA: Diagnosis not present

## 2020-05-24 DIAGNOSIS — Z743 Need for continuous supervision: Secondary | ICD-10-CM | POA: Diagnosis not present

## 2020-05-24 DIAGNOSIS — R262 Difficulty in walking, not elsewhere classified: Secondary | ICD-10-CM | POA: Diagnosis not present

## 2020-05-24 DIAGNOSIS — R41841 Cognitive communication deficit: Secondary | ICD-10-CM | POA: Diagnosis not present

## 2020-05-24 DIAGNOSIS — R279 Unspecified lack of coordination: Secondary | ICD-10-CM | POA: Diagnosis not present

## 2020-05-24 DIAGNOSIS — M25662 Stiffness of left knee, not elsewhere classified: Secondary | ICD-10-CM | POA: Diagnosis not present

## 2020-05-24 DIAGNOSIS — R5381 Other malaise: Secondary | ICD-10-CM | POA: Diagnosis not present

## 2020-05-24 DIAGNOSIS — M25661 Stiffness of right knee, not elsewhere classified: Secondary | ICD-10-CM | POA: Diagnosis not present

## 2020-05-24 DIAGNOSIS — M6281 Muscle weakness (generalized): Secondary | ICD-10-CM | POA: Diagnosis not present

## 2020-05-24 NOTE — ED Notes (Signed)
Report given to Jen, RN

## 2020-05-24 NOTE — ED Notes (Signed)
Pt. Resting, sleeping and no breathing difficulties.

## 2020-05-25 ENCOUNTER — Encounter: Payer: Self-pay | Admitting: Family Medicine

## 2020-05-25 ENCOUNTER — Encounter: Payer: Self-pay | Admitting: Adult Health

## 2020-05-25 DIAGNOSIS — M6281 Muscle weakness (generalized): Secondary | ICD-10-CM | POA: Diagnosis not present

## 2020-05-25 DIAGNOSIS — R262 Difficulty in walking, not elsewhere classified: Secondary | ICD-10-CM | POA: Diagnosis not present

## 2020-05-25 DIAGNOSIS — R41841 Cognitive communication deficit: Secondary | ICD-10-CM | POA: Diagnosis not present

## 2020-05-25 DIAGNOSIS — R278 Other lack of coordination: Secondary | ICD-10-CM | POA: Diagnosis not present

## 2020-05-25 DIAGNOSIS — M25662 Stiffness of left knee, not elsewhere classified: Secondary | ICD-10-CM | POA: Diagnosis not present

## 2020-05-25 DIAGNOSIS — M25661 Stiffness of right knee, not elsewhere classified: Secondary | ICD-10-CM | POA: Diagnosis not present

## 2020-05-26 DIAGNOSIS — R41841 Cognitive communication deficit: Secondary | ICD-10-CM | POA: Diagnosis not present

## 2020-05-26 DIAGNOSIS — R278 Other lack of coordination: Secondary | ICD-10-CM | POA: Diagnosis not present

## 2020-05-26 DIAGNOSIS — M25661 Stiffness of right knee, not elsewhere classified: Secondary | ICD-10-CM | POA: Diagnosis not present

## 2020-05-26 DIAGNOSIS — R262 Difficulty in walking, not elsewhere classified: Secondary | ICD-10-CM | POA: Diagnosis not present

## 2020-05-26 DIAGNOSIS — M6281 Muscle weakness (generalized): Secondary | ICD-10-CM | POA: Diagnosis not present

## 2020-05-26 DIAGNOSIS — M25662 Stiffness of left knee, not elsewhere classified: Secondary | ICD-10-CM | POA: Diagnosis not present

## 2020-05-29 DIAGNOSIS — R41841 Cognitive communication deficit: Secondary | ICD-10-CM | POA: Diagnosis not present

## 2020-05-29 DIAGNOSIS — Z20828 Contact with and (suspected) exposure to other viral communicable diseases: Secondary | ICD-10-CM | POA: Diagnosis not present

## 2020-05-29 DIAGNOSIS — R262 Difficulty in walking, not elsewhere classified: Secondary | ICD-10-CM | POA: Diagnosis not present

## 2020-05-29 DIAGNOSIS — M25662 Stiffness of left knee, not elsewhere classified: Secondary | ICD-10-CM | POA: Diagnosis not present

## 2020-05-29 DIAGNOSIS — R278 Other lack of coordination: Secondary | ICD-10-CM | POA: Diagnosis not present

## 2020-05-29 DIAGNOSIS — M6281 Muscle weakness (generalized): Secondary | ICD-10-CM | POA: Diagnosis not present

## 2020-05-29 DIAGNOSIS — M25661 Stiffness of right knee, not elsewhere classified: Secondary | ICD-10-CM | POA: Diagnosis not present

## 2020-05-29 DIAGNOSIS — Z1159 Encounter for screening for other viral diseases: Secondary | ICD-10-CM | POA: Diagnosis not present

## 2020-05-30 DIAGNOSIS — R278 Other lack of coordination: Secondary | ICD-10-CM | POA: Diagnosis not present

## 2020-05-30 DIAGNOSIS — R41841 Cognitive communication deficit: Secondary | ICD-10-CM | POA: Diagnosis not present

## 2020-05-30 DIAGNOSIS — M25661 Stiffness of right knee, not elsewhere classified: Secondary | ICD-10-CM | POA: Diagnosis not present

## 2020-05-30 DIAGNOSIS — R262 Difficulty in walking, not elsewhere classified: Secondary | ICD-10-CM | POA: Diagnosis not present

## 2020-05-30 DIAGNOSIS — M25662 Stiffness of left knee, not elsewhere classified: Secondary | ICD-10-CM | POA: Diagnosis not present

## 2020-05-30 DIAGNOSIS — M6281 Muscle weakness (generalized): Secondary | ICD-10-CM | POA: Diagnosis not present

## 2020-05-31 DIAGNOSIS — R41841 Cognitive communication deficit: Secondary | ICD-10-CM | POA: Diagnosis not present

## 2020-05-31 DIAGNOSIS — R278 Other lack of coordination: Secondary | ICD-10-CM | POA: Diagnosis not present

## 2020-05-31 DIAGNOSIS — M25662 Stiffness of left knee, not elsewhere classified: Secondary | ICD-10-CM | POA: Diagnosis not present

## 2020-05-31 DIAGNOSIS — M25661 Stiffness of right knee, not elsewhere classified: Secondary | ICD-10-CM | POA: Diagnosis not present

## 2020-05-31 DIAGNOSIS — R262 Difficulty in walking, not elsewhere classified: Secondary | ICD-10-CM | POA: Diagnosis not present

## 2020-05-31 DIAGNOSIS — M6281 Muscle weakness (generalized): Secondary | ICD-10-CM | POA: Diagnosis not present

## 2020-06-01 DIAGNOSIS — M25661 Stiffness of right knee, not elsewhere classified: Secondary | ICD-10-CM | POA: Diagnosis not present

## 2020-06-01 DIAGNOSIS — R278 Other lack of coordination: Secondary | ICD-10-CM | POA: Diagnosis not present

## 2020-06-01 DIAGNOSIS — M6281 Muscle weakness (generalized): Secondary | ICD-10-CM | POA: Diagnosis not present

## 2020-06-01 DIAGNOSIS — R41841 Cognitive communication deficit: Secondary | ICD-10-CM | POA: Diagnosis not present

## 2020-06-01 DIAGNOSIS — R262 Difficulty in walking, not elsewhere classified: Secondary | ICD-10-CM | POA: Diagnosis not present

## 2020-06-01 DIAGNOSIS — M25662 Stiffness of left knee, not elsewhere classified: Secondary | ICD-10-CM | POA: Diagnosis not present

## 2020-06-05 ENCOUNTER — Encounter: Payer: Self-pay | Admitting: Family Medicine

## 2020-06-05 DIAGNOSIS — Z20828 Contact with and (suspected) exposure to other viral communicable diseases: Secondary | ICD-10-CM | POA: Diagnosis not present

## 2020-06-05 DIAGNOSIS — Z1159 Encounter for screening for other viral diseases: Secondary | ICD-10-CM | POA: Diagnosis not present

## 2020-06-06 DIAGNOSIS — M6281 Muscle weakness (generalized): Secondary | ICD-10-CM | POA: Diagnosis not present

## 2020-06-06 DIAGNOSIS — R41841 Cognitive communication deficit: Secondary | ICD-10-CM | POA: Diagnosis not present

## 2020-06-06 DIAGNOSIS — M25661 Stiffness of right knee, not elsewhere classified: Secondary | ICD-10-CM | POA: Diagnosis not present

## 2020-06-06 DIAGNOSIS — R278 Other lack of coordination: Secondary | ICD-10-CM | POA: Diagnosis not present

## 2020-06-06 DIAGNOSIS — M25662 Stiffness of left knee, not elsewhere classified: Secondary | ICD-10-CM | POA: Diagnosis not present

## 2020-06-06 DIAGNOSIS — R262 Difficulty in walking, not elsewhere classified: Secondary | ICD-10-CM | POA: Diagnosis not present

## 2020-06-07 DIAGNOSIS — M25662 Stiffness of left knee, not elsewhere classified: Secondary | ICD-10-CM | POA: Diagnosis not present

## 2020-06-07 DIAGNOSIS — R41841 Cognitive communication deficit: Secondary | ICD-10-CM | POA: Diagnosis not present

## 2020-06-07 DIAGNOSIS — R278 Other lack of coordination: Secondary | ICD-10-CM | POA: Diagnosis not present

## 2020-06-07 DIAGNOSIS — M25661 Stiffness of right knee, not elsewhere classified: Secondary | ICD-10-CM | POA: Diagnosis not present

## 2020-06-07 DIAGNOSIS — R262 Difficulty in walking, not elsewhere classified: Secondary | ICD-10-CM | POA: Diagnosis not present

## 2020-06-07 DIAGNOSIS — M6281 Muscle weakness (generalized): Secondary | ICD-10-CM | POA: Diagnosis not present

## 2020-06-12 DIAGNOSIS — Z20828 Contact with and (suspected) exposure to other viral communicable diseases: Secondary | ICD-10-CM | POA: Diagnosis not present

## 2020-06-12 DIAGNOSIS — Z1159 Encounter for screening for other viral diseases: Secondary | ICD-10-CM | POA: Diagnosis not present

## 2020-06-13 DIAGNOSIS — R488 Other symbolic dysfunctions: Secondary | ICD-10-CM | POA: Diagnosis not present

## 2020-06-13 DIAGNOSIS — R41841 Cognitive communication deficit: Secondary | ICD-10-CM | POA: Diagnosis not present

## 2020-06-13 DIAGNOSIS — M25662 Stiffness of left knee, not elsewhere classified: Secondary | ICD-10-CM | POA: Diagnosis not present

## 2020-06-13 DIAGNOSIS — N3941 Urge incontinence: Secondary | ICD-10-CM | POA: Diagnosis not present

## 2020-06-13 DIAGNOSIS — M6281 Muscle weakness (generalized): Secondary | ICD-10-CM | POA: Diagnosis not present

## 2020-06-13 DIAGNOSIS — M25661 Stiffness of right knee, not elsewhere classified: Secondary | ICD-10-CM | POA: Diagnosis not present

## 2020-06-13 DIAGNOSIS — R262 Difficulty in walking, not elsewhere classified: Secondary | ICD-10-CM | POA: Diagnosis not present

## 2020-06-13 DIAGNOSIS — R278 Other lack of coordination: Secondary | ICD-10-CM | POA: Diagnosis not present

## 2020-06-14 DIAGNOSIS — R262 Difficulty in walking, not elsewhere classified: Secondary | ICD-10-CM | POA: Diagnosis not present

## 2020-06-14 DIAGNOSIS — M6281 Muscle weakness (generalized): Secondary | ICD-10-CM | POA: Diagnosis not present

## 2020-06-14 DIAGNOSIS — M25661 Stiffness of right knee, not elsewhere classified: Secondary | ICD-10-CM | POA: Diagnosis not present

## 2020-06-14 DIAGNOSIS — R278 Other lack of coordination: Secondary | ICD-10-CM | POA: Diagnosis not present

## 2020-06-14 DIAGNOSIS — R41841 Cognitive communication deficit: Secondary | ICD-10-CM | POA: Diagnosis not present

## 2020-06-14 DIAGNOSIS — M25662 Stiffness of left knee, not elsewhere classified: Secondary | ICD-10-CM | POA: Diagnosis not present

## 2020-06-16 DIAGNOSIS — M25662 Stiffness of left knee, not elsewhere classified: Secondary | ICD-10-CM | POA: Diagnosis not present

## 2020-06-16 DIAGNOSIS — M6281 Muscle weakness (generalized): Secondary | ICD-10-CM | POA: Diagnosis not present

## 2020-06-16 DIAGNOSIS — R278 Other lack of coordination: Secondary | ICD-10-CM | POA: Diagnosis not present

## 2020-06-16 DIAGNOSIS — R41841 Cognitive communication deficit: Secondary | ICD-10-CM | POA: Diagnosis not present

## 2020-06-16 DIAGNOSIS — R262 Difficulty in walking, not elsewhere classified: Secondary | ICD-10-CM | POA: Diagnosis not present

## 2020-06-16 DIAGNOSIS — M25661 Stiffness of right knee, not elsewhere classified: Secondary | ICD-10-CM | POA: Diagnosis not present

## 2020-06-19 DIAGNOSIS — R41841 Cognitive communication deficit: Secondary | ICD-10-CM | POA: Diagnosis not present

## 2020-06-19 DIAGNOSIS — M25662 Stiffness of left knee, not elsewhere classified: Secondary | ICD-10-CM | POA: Diagnosis not present

## 2020-06-19 DIAGNOSIS — Z20828 Contact with and (suspected) exposure to other viral communicable diseases: Secondary | ICD-10-CM | POA: Diagnosis not present

## 2020-06-19 DIAGNOSIS — M6281 Muscle weakness (generalized): Secondary | ICD-10-CM | POA: Diagnosis not present

## 2020-06-19 DIAGNOSIS — R262 Difficulty in walking, not elsewhere classified: Secondary | ICD-10-CM | POA: Diagnosis not present

## 2020-06-19 DIAGNOSIS — M25661 Stiffness of right knee, not elsewhere classified: Secondary | ICD-10-CM | POA: Diagnosis not present

## 2020-06-19 DIAGNOSIS — Z1159 Encounter for screening for other viral diseases: Secondary | ICD-10-CM | POA: Diagnosis not present

## 2020-06-19 DIAGNOSIS — R278 Other lack of coordination: Secondary | ICD-10-CM | POA: Diagnosis not present

## 2020-06-20 ENCOUNTER — Encounter (INDEPENDENT_AMBULATORY_CARE_PROVIDER_SITE_OTHER): Payer: Medicare Other | Admitting: Ophthalmology

## 2020-06-20 DIAGNOSIS — R41841 Cognitive communication deficit: Secondary | ICD-10-CM | POA: Diagnosis not present

## 2020-06-26 DIAGNOSIS — Z20828 Contact with and (suspected) exposure to other viral communicable diseases: Secondary | ICD-10-CM | POA: Diagnosis not present

## 2020-06-26 DIAGNOSIS — Z1159 Encounter for screening for other viral diseases: Secondary | ICD-10-CM | POA: Diagnosis not present

## 2020-07-03 DIAGNOSIS — Z1159 Encounter for screening for other viral diseases: Secondary | ICD-10-CM | POA: Diagnosis not present

## 2020-07-03 DIAGNOSIS — Z20828 Contact with and (suspected) exposure to other viral communicable diseases: Secondary | ICD-10-CM | POA: Diagnosis not present

## 2020-07-10 DIAGNOSIS — Z1159 Encounter for screening for other viral diseases: Secondary | ICD-10-CM | POA: Diagnosis not present

## 2020-07-10 DIAGNOSIS — Z20828 Contact with and (suspected) exposure to other viral communicable diseases: Secondary | ICD-10-CM | POA: Diagnosis not present

## 2020-07-17 DIAGNOSIS — Z1159 Encounter for screening for other viral diseases: Secondary | ICD-10-CM | POA: Diagnosis not present

## 2020-07-17 DIAGNOSIS — Z20828 Contact with and (suspected) exposure to other viral communicable diseases: Secondary | ICD-10-CM | POA: Diagnosis not present

## 2020-07-24 DIAGNOSIS — Z1159 Encounter for screening for other viral diseases: Secondary | ICD-10-CM | POA: Diagnosis not present

## 2020-07-24 DIAGNOSIS — Z20828 Contact with and (suspected) exposure to other viral communicable diseases: Secondary | ICD-10-CM | POA: Diagnosis not present

## 2020-07-31 DIAGNOSIS — Z20828 Contact with and (suspected) exposure to other viral communicable diseases: Secondary | ICD-10-CM | POA: Diagnosis not present

## 2020-07-31 DIAGNOSIS — Z1159 Encounter for screening for other viral diseases: Secondary | ICD-10-CM | POA: Diagnosis not present

## 2020-08-06 ENCOUNTER — Other Ambulatory Visit: Payer: Self-pay | Admitting: Family Medicine

## 2020-08-07 DIAGNOSIS — Z20828 Contact with and (suspected) exposure to other viral communicable diseases: Secondary | ICD-10-CM | POA: Diagnosis not present

## 2020-08-07 DIAGNOSIS — Z1159 Encounter for screening for other viral diseases: Secondary | ICD-10-CM | POA: Diagnosis not present

## 2020-08-14 DIAGNOSIS — Z20828 Contact with and (suspected) exposure to other viral communicable diseases: Secondary | ICD-10-CM | POA: Diagnosis not present

## 2020-08-14 DIAGNOSIS — Z1159 Encounter for screening for other viral diseases: Secondary | ICD-10-CM | POA: Diagnosis not present

## 2020-08-21 DIAGNOSIS — Z20828 Contact with and (suspected) exposure to other viral communicable diseases: Secondary | ICD-10-CM | POA: Diagnosis not present

## 2020-08-21 DIAGNOSIS — Z1159 Encounter for screening for other viral diseases: Secondary | ICD-10-CM | POA: Diagnosis not present

## 2020-08-28 DIAGNOSIS — Z20828 Contact with and (suspected) exposure to other viral communicable diseases: Secondary | ICD-10-CM | POA: Diagnosis not present

## 2020-08-28 DIAGNOSIS — Z1159 Encounter for screening for other viral diseases: Secondary | ICD-10-CM | POA: Diagnosis not present

## 2020-09-01 ENCOUNTER — Encounter: Payer: Self-pay | Admitting: Family Medicine

## 2020-09-04 DIAGNOSIS — Z1159 Encounter for screening for other viral diseases: Secondary | ICD-10-CM | POA: Diagnosis not present

## 2020-09-04 DIAGNOSIS — Z20828 Contact with and (suspected) exposure to other viral communicable diseases: Secondary | ICD-10-CM | POA: Diagnosis not present

## 2020-09-05 ENCOUNTER — Other Ambulatory Visit: Payer: Self-pay | Admitting: Family Medicine

## 2020-09-11 DIAGNOSIS — Z20828 Contact with and (suspected) exposure to other viral communicable diseases: Secondary | ICD-10-CM | POA: Diagnosis not present

## 2020-09-11 DIAGNOSIS — Z1159 Encounter for screening for other viral diseases: Secondary | ICD-10-CM | POA: Diagnosis not present

## 2020-09-18 DIAGNOSIS — Z1159 Encounter for screening for other viral diseases: Secondary | ICD-10-CM | POA: Diagnosis not present

## 2020-09-18 DIAGNOSIS — Z20828 Contact with and (suspected) exposure to other viral communicable diseases: Secondary | ICD-10-CM | POA: Diagnosis not present

## 2020-09-25 DIAGNOSIS — Z20828 Contact with and (suspected) exposure to other viral communicable diseases: Secondary | ICD-10-CM | POA: Diagnosis not present

## 2020-09-25 DIAGNOSIS — Z1159 Encounter for screening for other viral diseases: Secondary | ICD-10-CM | POA: Diagnosis not present

## 2020-10-02 DIAGNOSIS — Z1159 Encounter for screening for other viral diseases: Secondary | ICD-10-CM | POA: Diagnosis not present

## 2020-10-02 DIAGNOSIS — Z20828 Contact with and (suspected) exposure to other viral communicable diseases: Secondary | ICD-10-CM | POA: Diagnosis not present

## 2020-10-09 DIAGNOSIS — Z20828 Contact with and (suspected) exposure to other viral communicable diseases: Secondary | ICD-10-CM | POA: Diagnosis not present

## 2020-10-09 DIAGNOSIS — Z1159 Encounter for screening for other viral diseases: Secondary | ICD-10-CM | POA: Diagnosis not present

## 2020-10-13 DIAGNOSIS — Z23 Encounter for immunization: Secondary | ICD-10-CM | POA: Diagnosis not present

## 2020-10-16 DIAGNOSIS — Z20828 Contact with and (suspected) exposure to other viral communicable diseases: Secondary | ICD-10-CM | POA: Diagnosis not present

## 2020-10-18 ENCOUNTER — Ambulatory Visit (INDEPENDENT_AMBULATORY_CARE_PROVIDER_SITE_OTHER): Payer: Medicare Other | Admitting: Adult Health

## 2020-10-18 ENCOUNTER — Encounter: Payer: Self-pay | Admitting: Adult Health

## 2020-10-18 VITALS — BP 158/85 | HR 79 | Ht 67.0 in | Wt 129.0 lb

## 2020-10-18 DIAGNOSIS — G43709 Chronic migraine without aura, not intractable, without status migrainosus: Secondary | ICD-10-CM | POA: Diagnosis not present

## 2020-10-18 DIAGNOSIS — F0390 Unspecified dementia without behavioral disturbance: Secondary | ICD-10-CM | POA: Diagnosis not present

## 2020-10-18 MED ORDER — UBRELVY 100 MG PO TABS: ORAL_TABLET | ORAL | 5 refills | Status: DC

## 2020-10-18 NOTE — Patient Instructions (Signed)
Your Plan:  Continue to monitor memory Try ubrelvy 100 mg at the onset of migraine and can repeat in 2 hours if needed If your symptoms worsen or you develop new symptoms please let us know.    Thank you for coming to see [place] at [facility]. I hope we have been able to provide you high quality care today.  You may receive a patient satisfaction survey over the next few weeks. We would appreciate your feedback and comments so that we may continue to improve ourselves and the health of our patients.

## 2020-10-18 NOTE — Progress Notes (Signed)
PATIENT: Laura Li DOB: 09/19/1940  REASON FOR VISIT: follow up HISTORY FROM: patient Primary neurologist: Dr. Brett Fairy  HISTORY OF PRESENT ILLNESS: Today 10/18/20:  Laura Li is an 80 year old female with a history of memory disturbance.  She returns today for follow-up.  She continues to live at [facility].  The patient feels that her memory has improved.  She reports that she is able to complete all ADLs independently.  But her daughter clarifies that they have a caregiver with her 9 hours a day and sometimes they do assist with ADLs.  The patient reports that she is sleeping okay.  The facility dispenses her medications.  Reports that she sleeps okay.  She was unable to tolerate Aricept and Namenda  Patient also reports that she has a history of headaches.  She has seen a headache specialist in the past.  She feels that she may have been on triptans in the past but does not remember which one.  The daughter states that about every 3 weeks she will have a severe headache that will result in nausea and vomiting and could last 1 to 2 days.  The patient states that sometimes if she takes Tylenol it is beneficial but not always.  She has a history of blood clots.  She returns today for an evaluation.  04/20/20: Laura Li is an 80 year old female with a history of memory disturbance.  She returns today for follow-up.  She continues to live at [facility].  She is here with her daughter.  Daughter has noticed a significant change in her ability to perform household duties.  She states that she has have a close picked out.  She is often put her clothes on incorrectly.  Everything in her apartment is labeled.  [facility] with now manages all of her medications.  She is gotten lost in the facility.  She does not operate a motor vehicle.  Her mood has remained pleasant.  She tried Aricept in the past but it resulted in itching.  Patient returns today for an evaluation.  Laura Li is an  80 year old female with a history of memory disturbance.  She returns today for follow-up.  She continues to live at [facility] and independent living.  She reports that her memory may be slightly worse.  She is able to complete all ADLs independently.  She does not operate a motor vehicle.  Denies any trouble sleeping.  She no longer does any cooking.  She reports at times she feels sad.  Her daughter reports that she gets confused with where she is living.  The daughter continues to notice some subtle changes with her memory.  She is currently not on any memory medication.  She returns today for an evaluation.  HISTORY 04/12/19:   Laura Li is an 80 year old female with a history of mild cognitive impairment.  She returns today for follow-up. She is here today with her daughter. She feels that her memory is worse. She continues to live [facility]. She completes all ADLs independently. Her daughter helps her with her finances. She eats in the dining room. She does not make any meals. She does not operate a motor vehicle. She reports that there are times that she is slightly depressed but daughter feels that this is mild. She is currently on Effexor. Daughter reports that there is a certain man that lives across the hallway from the patient and she often gets him confused. She states that there are multiple  men name "Collier Salina." The daughter has noticed more confusion with certain things. Patient returns today for an evaluation.    REVIEW OF SYSTEMS: Out of a complete 14 system review of symptoms, the patient complains only of the following symptoms, and all other reviewed systems are negative.  See HPI  ALLERGIES: Allergies  Allergen Reactions   Aricept [Donepezil Hcl] Itching   Dyazide [Hydrochlorothiazide W-Triamterene]     Presumption: drug induced vasculitis   Namenda [Memantine]    Niacin Other (See Comments)    flushing   Penicillins Diarrhea and Nausea And Vomiting    HOME  MEDICATIONS: Outpatient Medications Prior to Visit  Medication Sig Dispense Refill   acetaminophen (TYLENOL) 500 MG tablet Take 500 mg by mouth daily as needed for mild pain.     atorvastatin (LIPITOR) 20 MG tablet Take 20 mg by mouth daily.     B Complex-C-E-Zn (B COMPLEX-C-E-ZINC) tablet Take 1 tablet by mouth daily.     Calcium-Vitamin D (CALTRATE 600 PLUS-VIT D PO) Take 1 tablet by mouth 2 (two) times daily.     carboxymethylcellulose (REFRESH PLUS) 0.5 % SOLN 2 drops 3 (three) times daily as needed (dry eyes).     famotidine (PEPCID) 20 MG tablet Take 1 tablet (20 mg total) by mouth 2 (two) times daily. 60 tablet 5   ipratropium (ATROVENT) 0.03 % nasal spray Place 2 sprays into the nose every 12 (twelve) hours. (Patient not taking: No sig reported) 180 mL 3   Lactobacillus-Inulin (CULTURELLE DIGESTIVE HEALTH PO) Take 1 capsule by mouth daily.     levothyroxine (SYNTHROID) 88 MCG tablet TAKE 1 TABLET(88 MCG) BY MOUTH DAILY (Patient taking differently: Take 88 mcg by mouth daily before breakfast.) 90 tablet 3   Multiple Vitamins-Minerals (CENTRUM SILVER PO) Take 1 tablet by mouth daily.     MYRBETRIQ 50 MG TB24 tablet TAKE 1 TABLET(50 MG) BY MOUTH DAILY 30 tablet 5   nadolol (CORGARD) 20 MG tablet TAKE 1/2 TABLET(10 MG) BY MOUTH DAILY 45 tablet 2   rosuvastatin (CRESTOR) 10 MG tablet Take 1 tablet (10 mg total) by mouth daily. 90 tablet 3   venlafaxine XR (EFFEXOR-XR) 150 MG 24 hr capsule TAKE 1 CAPSULE(150 MG) BY MOUTH DAILY (Patient taking differently: Take 150 mg by mouth daily with breakfast.) 90 capsule 3   vitamin B-12 (CYANOCOBALAMIN) 100 MCG tablet Take 100 mcg by mouth daily.     memantine (NAMENDA TITRATION PAK) tablet pack 5 mg/day for =1 week; 5 mg twice daily for =1 week; 15 mg/day given in 5 mg and 10 mg separated doses for =1 week; then 10 mg twice daily 49 tablet 0   Facility-Administered Medications Prior to Visit  Medication Dose Route Frequency Provider Last Rate Last Admin    Bevacizumab (AVASTIN) SOLN 1.25 mg  1.25 mg Intravitreal  Bernarda Caffey, MD   1.25 mg at 10/10/17 1318   Bevacizumab (AVASTIN) SOLN 1.25 mg  1.25 mg Intravitreal  Bernarda Caffey, MD   1.25 mg at 11/07/17 1206   Bevacizumab (AVASTIN) SOLN 1.25 mg  1.25 mg Intravitreal  Bernarda Caffey, MD   1.25 mg at 12/05/17 1212   Bevacizumab (AVASTIN) SOLN 1.25 mg  1.25 mg Intravitreal  Bernarda Caffey, MD   1.25 mg at 01/04/18 2328   Bevacizumab (AVASTIN) SOLN 1.25 mg  1.25 mg Intravitreal  Bernarda Caffey, MD   1.25 mg at 01/30/18 1230   Bevacizumab (AVASTIN) SOLN 1.25 mg  1.25 mg Intravitreal  Bernarda Caffey, MD   1.25  mg at 02/27/18 1315   Bevacizumab (AVASTIN) SOLN 1.25 mg  1.25 mg Intravitreal  Bernarda Caffey, MD   1.25 mg at 03/27/18 1138   Bevacizumab (AVASTIN) SOLN 1.25 mg  1.25 mg Intravitreal  Bernarda Caffey, MD   1.25 mg at 05/08/18 1231    PAST MEDICAL HISTORY: Past Medical History:  Diagnosis Date   Anemia    from medicaions   Arthritis    Chronic kidney disease    some renal impairment from meds   Depression    states no depression   Eczema    Esophageal stricture    GERD (gastroesophageal reflux disease)    Headache    Heart murmur    states it is benign told 30-40 years ago   History of DVT (deep vein thrombosis)    HX: breast cancer    melanoma   Hyperlipidemia    Hypertension    Hypothyroidism    Melanoma ([place])    OA (osteoarthritis)    Personal history of chemotherapy 2016   Left Breast Cancer   Personal history of radiation therapy 2016   Left Breast Cancer   Pneumothorax on right 06/15/2014   PONV (postoperative nausea and vomiting)    Wears glasses     PAST SURGICAL HISTORY: Past Surgical History:  Procedure Laterality Date   APPENDECTOMY  1967   BREAST EXCISIONAL BIOPSY Left    BREAST LUMPECTOMY Left 2016   BREAST SURGERY  1977   removal of calcified milk gland right breast   CHEST TUBE INSERTION  06/15/2014   CHEST TUBE INSERTION Bilateral 06/15/2014   Procedure:  CHEST TUBE INSERTION;  Surgeon: Rolm Bookbinder, MD;  Location: [facility];  Service: General;  Laterality: Bilateral;   COLONOSCOPY     [place]   right breast   MASTECTOMY Right 1998   right-nodes out   MELANOMA EXCISION  2004   right side of face   MOHS SURGERY  2005   right left-basal cell   PORT-A-CATH REMOVAL N/A 10/05/2014   Procedure: REMOVAL PORT-A-CATH;  Surgeon: Rolm Bookbinder, MD;  Location: [facility];  Service: General;  Laterality: N/A;   PORTACATH PLACEMENT Left 06/15/2014   Procedure: INSERTION PORT-A-CATH;  Surgeon: Rolm Bookbinder, MD;  Location: [facility];  Service: General;  Laterality: Left;   RADIOACTIVE SEED GUIDED PARTIAL MASTECTOMY WITH AXILLARY SENTINEL LYMPH NODE BIOPSY Left 05/05/2014   Procedure: RADIOACTIVE SEED GUIDED LEFT LUMPECTOMY WITH AXILLARY SENTINEL LYMPH NODE BIOPSY;  Surgeon: Rolm Bookbinder, MD;  Location: [facility];  Service: General;  Laterality: Left;   TONSILLECTOMY AND ADENOIDECTOMY  1949   TOTAL ABDOMINAL HYSTERECTOMY  1992   including ovaries    FAMILY HISTORY: Family History  Problem Relation Age of Onset   Heart disease Father 57       smoker   Heart attack Father    Endometrial cancer Mother 68   Breast cancer Cousin        x 3 cousins with breast cancer (one at 76, one at 41 and one at 55)   Lung cancer Paternal Aunt        smoker   Brain cancer Maternal Aunt    Diabetes Maternal Aunt        ???   Breast cancer Maternal Aunt 15   Healthy Sister    Tuberculosis Maternal Grandfather    Cervical cancer Paternal Grandmother    Healthy Daughter    Colon cancer Neg Hx    Amblyopia  Neg Hx    Blindness Neg Hx    Cataracts Neg Hx    Glaucoma Neg Hx    Macular degeneration Neg Hx    Retinal detachment Neg Hx    Strabismus Neg Hx    Retinitis pigmentosa Neg Hx     SOCIAL HISTORY: Social History   Socioeconomic History   Marital status: Widowed    Spouse name: Not on file   Number  of children: Not on file   Years of education: Not on file   Highest education level: Not on file  Occupational History   Occupation: retired  Tobacco Use   Smoking status: Former    Packs/day: 0.75    Years: 3.00    Pack years: 2.25    Types: Cigarettes    Quit date: 03/11/1969    Years since quitting: 51.6   Smokeless tobacco: Never  Vaping Use   Vaping Use: Never used  Substance and Sexual Activity   Alcohol use: Yes    Alcohol/week: 0.0 standard drinks    Comment: occasional wine   Drug use: No   Sexual activity: Not Currently    Birth control/protection: None  Other Topics Concern   Not on file  Social History Narrative   Widowed 2003. Moved to [place] from [place] in 2004 after loss of husband to be near daughter. 1 daughter. 2 grandkids.       Retired from working at a church      Hobbies: Training and development officer   Social Determinants of Radio broadcast assistant Strain: Not on [employer] Insecurity: Not on file  Transportation Needs: Not on file  Physical Activity: Not on file  Stress: Not on file  Social Connections: Not on file  Intimate Partner Violence: Not on file      PHYSICAL EXAM  Vitals:   10/18/20 1508  BP: (!) 158/85  Pulse: 79  Weight: 129 lb (58.5 kg)  Height: '5\' 7"'$  (1.702 m)   Body mass index is 20.2 kg/m.   MMSE - Mini Mental State Exam 10/18/2020 04/20/2020 10/18/2019  Not completed: - - -  Orientation to time '1 3 3  '$ Orientation to Place '1 1 4  '$ Registration '3 3 3  '$ Attention/ Calculation 0 0 5  Attention/Calculation-comments - - -  Recall '1 2 2  '$ Language- name 2 objects '2 2 2  '$ Language- repeat 1 0 1  Language- follow 3 step command '3 3 3  '$ Language- read & follow direction 0 1 1  Write a sentence '1 1 1  '$ Copy design 0 1 1  Copy design-comments - - -  Total score '13 17 26     '$ Generalized: Well developed, in no acute distress   Neurological examination  Mentation: Alert oriented to time, place, history taking. Follows all commands speech  and language fluent Cranial nerve II-XII: Pupils were equal round reactive to light. Extraocular movements were full, visual field were full on confrontational test. Head turning and shoulder shrug  were normal and symmetric. Motor: The motor testing reveals 5 over 5 strength of all 4 extremities. Good symmetric motor tone is noted throughout.  Sensory: Sensory testing is intact to soft touch on all 4 extremities. No evidence of extinction is noted.  Coordination: Cerebellar testing reveals good finger-nose-finger and heel-to-shin bilaterally.  Gait and station: Gait is normal.  Reflexes: Deep tendon reflexes are symmetric and normal bilaterally.   DIAGNOSTIC DATA (LABS, IMAGING, TESTING) - I reviewed patient records, labs, notes, testing and imaging  myself where available.  Lab Results  Component Value Date   WBC 6.7 05/23/2020   HGB 14.7 05/23/2020   HCT 41.3 05/23/2020   MCV 88.6 05/23/2020   PLT 257 05/23/2020      Component Value Date/Time   NA 137 05/23/2020 2035   NA 141 06/28/2016 1118   K 3.6 05/23/2020 2035   K 4.4 06/28/2016 1118   CL 97 (L) 05/23/2020 2035   CO2 33 (H) 05/23/2020 2035   CO2 26 06/28/2016 1118   GLUCOSE 91 05/23/2020 2035   GLUCOSE 106 06/28/2016 1118   GLUCOSE 88 02/06/2006 1059   BUN 21 05/23/2020 2035   BUN 16.2 06/28/2016 1118   CREATININE 1.16 (H) 05/23/2020 2035   CREATININE 1.05 06/24/2017 1108   CREATININE 1.0 06/28/2016 1118   CALCIUM 10.4 (H) 05/23/2020 2035   CALCIUM 9.9 06/28/2016 1118   PROT 8.5 (H) 05/23/2020 2035   PROT 7.6 06/28/2016 1118   ALBUMIN 4.8 05/23/2020 2035   ALBUMIN 4.2 06/28/2016 1118   AST 27 05/23/2020 2035   AST 24 06/24/2017 1108   AST 21 06/28/2016 1118   ALT 22 05/23/2020 2035   ALT 21 06/24/2017 1108   ALT 17 06/28/2016 1118   ALKPHOS 69 05/23/2020 2035   ALKPHOS 66 06/28/2016 1118   BILITOT 1.1 05/23/2020 2035   BILITOT 0.4 06/24/2017 1108   BILITOT 0.47 06/28/2016 1118   GFRNONAA 47 (L) 05/23/2020  2035   GFRNONAA 50 (L) 06/24/2017 1108   GFRAA 48 (L) 11/25/2019 1134   GFRAA 57 (L) 06/24/2017 1108   Lab Results  Component Value Date   CHOL 212 (H) 05/05/2020   HDL 40.10 05/05/2020   LDLCALC 124 (H) 06/21/2013   LDLDIRECT 101.0 05/05/2020   TRIG (H) 05/05/2020    483.0 Triglyceride is over 400; calculations on Lipids are invalid.   CHOLHDL 5 05/05/2020   No results found for: HGBA1C Lab Results  Component Value Date   [id number] 10/16/2017   Lab Results  Component Value Date   TSH 2.33 05/05/2020      ASSESSMENT AND PLAN 80 y.o. year old female  has a past medical history of Anemia, Arthritis, Chronic kidney disease, Depression, Eczema, Esophageal stricture, GERD (gastroesophageal reflux disease), Headache, Heart murmur, History of DVT (deep vein thrombosis), breast cancer, Hyperlipidemia, Hypertension, Hypothyroidism, Melanoma ([place]), OA (osteoarthritis), Personal history of chemotherapy (2016), Personal history of radiation therapy (2016), Pneumothorax on right (06/15/2014), PONV (postoperative nausea and vomiting), and Wears glasses. here with:  1.  Dementia  MMSE has declined 13 out of 30 previously 17 out of 30 We will continue to monitor memory unable to tolerate Aricept and Namenda  2.  Migraine headaches  Start Ubrelvy 100 mg to be taken at the onset of a migraine and can repeat in 2 hours if needed.  The patient feels that she has already tried triptans in the past.  Follow-up in 6 months or sooner if needed     Ward Givens, MSN, NP-C 10/18/2020, 3:23 PM [facility] [address] [place], [place] [address] [phone]

## 2020-10-20 ENCOUNTER — Telehealth: Payer: Self-pay

## 2020-10-20 NOTE — Telephone Encounter (Signed)
Error

## 2020-10-23 ENCOUNTER — Telehealth: Payer: Self-pay

## 2020-10-23 DIAGNOSIS — Z20828 Contact with and (suspected) exposure to other viral communicable diseases: Secondary | ICD-10-CM | POA: Diagnosis not present

## 2020-10-23 NOTE — Telephone Encounter (Signed)
Approved.  Effective from 10/23/2020 through 10/23/2021

## 2020-10-23 NOTE — Telephone Encounter (Signed)
I submitted a PA request for Ubrelvy '100mg'$  on C[facility], Key: [id number].   Awaiting determination from [place].

## 2020-10-26 ENCOUNTER — Encounter: Payer: Self-pay | Admitting: Family Medicine

## 2020-10-26 ENCOUNTER — Other Ambulatory Visit: Payer: Self-pay

## 2020-10-27 DIAGNOSIS — H353223 Exudative age-related macular degeneration, left eye, with inactive scar: Secondary | ICD-10-CM | POA: Diagnosis not present

## 2020-10-27 DIAGNOSIS — H35033 Hypertensive retinopathy, bilateral: Secondary | ICD-10-CM | POA: Diagnosis not present

## 2020-10-27 DIAGNOSIS — H52223 Regular astigmatism, bilateral: Secondary | ICD-10-CM | POA: Diagnosis not present

## 2020-10-27 DIAGNOSIS — H04123 Dry eye syndrome of bilateral lacrimal glands: Secondary | ICD-10-CM | POA: Diagnosis not present

## 2020-10-27 DIAGNOSIS — Z961 Presence of intraocular lens: Secondary | ICD-10-CM | POA: Diagnosis not present

## 2020-10-30 DIAGNOSIS — Z20828 Contact with and (suspected) exposure to other viral communicable diseases: Secondary | ICD-10-CM | POA: Diagnosis not present

## 2020-11-01 NOTE — Progress Notes (Signed)
Phone [phone]   Subjective:  Patient presents today for their q 6 month visit Chief complaint-noted.   See problem oriented charting-  The following were reviewed and entered/updated in epic: Past Medical History:  Diagnosis Date   Anemia    from medicaions   Arthritis    Chronic kidney disease    some renal impairment from meds   Depression    states no depression   Eczema    Esophageal stricture    GERD (gastroesophageal reflux disease)    Headache    Heart murmur    states it is benign told 30-40 years ago   History of DVT (deep vein thrombosis)    HX: breast cancer    melanoma   Hyperlipidemia    Hypertension    Hypothyroidism    Melanoma ([place])    OA (osteoarthritis)    Personal history of chemotherapy 2016   Left Breast Cancer   Personal history of radiation therapy 2016   Left Breast Cancer   Pneumothorax on right 06/15/2014   PONV (postoperative nausea and vomiting)    Wears glasses    Patient Active Problem List   Diagnosis Date Noted   MCI (mild cognitive impairment) 10/16/2016    Priority: High   Paroxysmal atrial fibrillation ([place]) 06/04/2016    Priority: High   Chronic deep vein thrombosis (DVT) ([place]) 09/01/2015    Priority: High   History of left breast cancer 04/24/2014    Priority: High   History of right breast cancer 04/24/2014    Priority: High   Depression, major, single episode 04/24/2017    Priority: Medium   Overactive bladder 09/19/2016    Priority: Medium   Acute lower UTI 05/29/2016    Priority: Medium   Osteopenia 05/23/2016    Priority: Medium   History of pneumothorax 06/15/2014    Priority: Medium   Chronic kidney disease, stage III (moderate) ([place]) 12/09/2012    Priority: Medium   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    Priority: Medium   History of vasculitis 11/09/2012    Priority: Medium   Hypothyroidism 02/06/2006    Priority: Medium   Hyperlipidemia 02/06/2006    Priority: Medium   Essential  hypertension 02/06/2006    Priority: Medium   Localized swelling, mass, or lump of lower extremity 11/05/2014    Priority: Low   Diverticulitis 08/09/2014    Priority: Low   Family history of breast cancer 04/29/2014    Priority: Low   GERD (gastroesophageal reflux disease) 03/31/2014    Priority: Low   Hot flashes 03/31/2014    Priority: Low   Osteoarthritis 03/31/2014    Priority: Low   Allergic rhinitis 02/06/2006    Priority: Low   Disorder of bone and cartilage 05/23/2017   Past Surgical History:  Procedure Laterality Date   APPENDECTOMY  1967   BREAST EXCISIONAL BIOPSY Left    BREAST LUMPECTOMY Left 2016   BREAST SURGERY  1977   removal of calcified milk gland right breast   CHEST TUBE INSERTION  06/15/2014   CHEST TUBE INSERTION Bilateral 06/15/2014   Procedure: CHEST TUBE INSERTION;  Surgeon: Rolm Bookbinder, MD;  Location: [facility];  Service: General;  Laterality: Bilateral;   COLONOSCOPY     [place]   right breast   MASTECTOMY Right 1998   right-nodes out   MELANOMA EXCISION  2004   right side of face   MOHS SURGERY  2005   right left-basal cell  PORT-A-CATH REMOVAL N/A 10/05/2014   Procedure: REMOVAL PORT-A-CATH;  Surgeon: Rolm Bookbinder, MD;  Location: [facility];  Service: General;  Laterality: N/A;   PORTACATH PLACEMENT Left 06/15/2014   Procedure: INSERTION PORT-A-CATH;  Surgeon: Rolm Bookbinder, MD;  Location: [facility];  Service: General;  Laterality: Left;   RADIOACTIVE SEED GUIDED PARTIAL MASTECTOMY WITH AXILLARY SENTINEL LYMPH NODE BIOPSY Left 05/05/2014   Procedure: RADIOACTIVE SEED GUIDED LEFT LUMPECTOMY WITH AXILLARY SENTINEL LYMPH NODE BIOPSY;  Surgeon: Rolm Bookbinder, MD;  Location: [facility];  Service: General;  Laterality: Left;   [place]   including ovaries    Family History  Problem Relation Age of Onset   Heart disease Father 38        smoker   Heart attack Father    Endometrial cancer Mother 22   Breast cancer Cousin        x 3 cousins with breast cancer (one at 26, one at 77 and one at 81)   Lung cancer Paternal Aunt        smoker   Brain cancer Maternal Aunt    Diabetes Maternal Aunt        ???   Breast cancer Maternal Aunt 72   Healthy Sister    Tuberculosis Maternal Grandfather    Cervical cancer Paternal Grandmother    Healthy Daughter    Colon cancer Neg Hx    Amblyopia Neg Hx    Blindness Neg Hx    Cataracts Neg Hx    Glaucoma Neg Hx    Macular degeneration Neg Hx    Retinal detachment Neg Hx    Strabismus Neg Hx    Retinitis pigmentosa Neg Hx     Medications- reviewed and updated Current Outpatient Medications  Medication Sig Dispense Refill   acetaminophen (TYLENOL) 500 MG tablet Take 500 mg by mouth daily as needed for mild pain.     atorvastatin (LIPITOR) 20 MG tablet Take 20 mg by mouth daily.     B Complex-C-E-Zn (B COMPLEX-C-E-ZINC) tablet Take 1 tablet by mouth daily.     Calcium-Vitamin D (CALTRATE 600 PLUS-VIT D PO) Take 1 tablet by mouth 2 (two) times daily.     carboxymethylcellulose (REFRESH PLUS) 0.5 % SOLN 2 drops 3 (three) times daily as needed (dry eyes).     famotidine (PEPCID) 20 MG tablet Take 1 tablet (20 mg total) by mouth 2 (two) times daily. 60 tablet 5   ipratropium (ATROVENT) 0.03 % nasal spray Place 2 sprays into the nose every 12 (twelve) hours. 180 mL 3   Lactobacillus-Inulin (CULTURELLE DIGESTIVE HEALTH PO) Take 1 capsule by mouth daily.     levothyroxine (SYNTHROID) 88 MCG tablet TAKE 1 TABLET(88 MCG) BY MOUTH DAILY (Patient taking differently: Take 88 mcg by mouth daily before breakfast.) 90 tablet 3   Multiple Vitamins-Minerals (CENTRUM SILVER PO) Take 1 tablet by mouth daily.     MYRBETRIQ 50 MG TB24 tablet TAKE 1 TABLET(50 MG) BY MOUTH DAILY 30 tablet 5   nadolol (CORGARD) 20 MG tablet TAKE 1/2 TABLET(10 MG) BY MOUTH DAILY 45 tablet 2   rosuvastatin  (CRESTOR) 10 MG tablet Take 1 tablet (10 mg total) by mouth daily. 90 tablet 3   Ubrogepant (UBRELVY) 100 MG TABS Take 1 tablet at the onset of migraine, repeat in 2 hours if headache does not resolve 15 tablet 5   venlafaxine XR (EFFEXOR-XR) 150 MG 24 hr capsule  TAKE 1 CAPSULE(150 MG) BY MOUTH DAILY (Patient taking differently: Take 150 mg by mouth daily with breakfast.) 90 capsule 3   vitamin B-12 (CYANOCOBALAMIN) 100 MCG tablet Take 100 mcg by mouth daily.     Current Facility-Administered Medications  Medication Dose Route Frequency Provider Last Rate Last Admin   Bevacizumab (AVASTIN) SOLN 1.25 mg  1.25 mg Intravitreal  Bernarda Caffey, MD   1.25 mg at 01/04/18 2328   Bevacizumab (AVASTIN) SOLN 1.25 mg  1.25 mg Intravitreal  Bernarda Caffey, MD   1.25 mg at 01/30/18 1230   Bevacizumab (AVASTIN) SOLN 1.25 mg  1.25 mg Intravitreal  Bernarda Caffey, MD   1.25 mg at 02/27/18 1315   Bevacizumab (AVASTIN) SOLN 1.25 mg  1.25 mg Intravitreal  Bernarda Caffey, MD   1.25 mg at 03/27/18 1138    Allergies-reviewed and updated Allergies  Allergen Reactions   Aricept [Donepezil Hcl] Itching   Dyazide [Hydrochlorothiazide W-Triamterene]     Presumption: drug induced vasculitis   Namenda [Memantine]    Niacin Other (See Comments)    flushing   Penicillins Diarrhea and Nausea And Vomiting    Social History   Social History Narrative   Widowed 2003. Moved to [place] from [place] in 2004 after loss of husband to be near daughter. 1 daughter. 2 grandkids.       Retired from working at a church      Hobbies: Training and development officer   Objective  Objective:  BP (!) 156/84   Pulse 71   Temp (!) 97 F (36.1 C) (Temporal)   Ht '5\' 7"'$  (1.702 m)   Wt 146 lb 3.2 oz (66.3 kg)   SpO2 98%   BMI 22.90 kg/m  Gen: NAD, resting comfortably HEENT: Mucous membranes are moist. Oropharynx normal Neck: no thyromegaly CV: RRR no murmurs rubs or gallops Lungs: CTAB no crackles, wheeze, rhonchi Abdomen:  soft/nontender/nondistended/normal bowel sounds. No rebound or guarding.  Ext: no edema Skin: warm, dry Neuro: grossly normal, moves all extremities, PERRLA, walks with walker- used assist today to get onto table   Assessment and Plan   80 y.o. female presenting for annual health maintenance counseling Health Maintenance counseling: 1. Anticipatory guidance: Patient counseled regarding regular dental exams -q3 months, eye exams - very regularly,  avoiding smoking and second hand smoke , limiting alcohol to 1 beverage per day- rare alcohol socially with group gatherings only, no drugs .   2. Risk factor reduction:  Advised patient of need for regular exercise and diet rich and fruits and vegetables to reduce risk of heart attack and stroke. Exercise- not exercising- encouraged her again. Diet-reasonably healthy- does like her sweets.  Wt Readings from Last 3 Encounters:  11/03/20 146 lb 3.2 oz (66.3 kg)  10/18/20 129 lb (58.5 kg)  05/23/20 146 lb 9.7 oz (66.5 kg)  3. Immunizations/screenings/ancillary studies DISCUSSED:  -Shingrix vaccination - advised at pharmacy - wants to hold off -COVID-19 vaccination #4 already had -Flu vaccination (last one 12/07/2019)- recommended in the fall - will get at [facility] -Tdap advised at pharmacy - - plans only if gets cut/scrape Immunization History  Administered Date(s) Administered   Fluad Quad(high Dose 65+) 11/26/2018   Influenza Split 02/11/2011   Influenza Whole 12/21/2008, 01/16/2010   Influenza, High Dose Seasonal PF 12/05/2015, 12/18/2016, 11/24/2017   Influenza,inj,Quad PF,6+ Mos 12/21/2013, 11/22/2014   Influenza-Unspecified 12/09/2012   Moderna Sars-Covid-2 Vaccination 04/01/2019, 04/29/2019, 01/13/2020   Pneumococcal Conjugate-13 09/01/2015   Pneumococcal Polysaccharide-23 03/11/2001, 10/18/2016   Td 03/19/2010  4. Cervical  cancer screening-  past age based screening recs  5. Breast cancer history- right and left- due to dementia  though we have opted to discontinue follow up screenings. Previoulsy followed by Dr. Jana Hakim 6. Colon cancer screening - colonoscopy on 11/2008 - with no repeat planned due to her age and dementia 7. Skin cancer screening- sees dermatology - has not done recently. advised regular sunscreen use. Denies worrisome, changing, or new skin lesions.  8. Birth control/STD check- has boyfriend but not active 9. Osteoporosis screening at 72- DEXA scan on 06/2017 - see notes below on osteopenia -former smoker quit in 1970s  Status of chronic or acute concerns   #Dementia/migraines-followed with Dr. Edwena Felty office-last visit 04/12/2019 with MMSE 25/30 with repeat testing 04/20/20 on 17/30- daughter stated typical with what she been seeing at home.  Patient opted out of medication such as Aricept or Namenda initially 2021. Since that time she had worsened issues. Aricept caused itching. Namenda was prescribed but did not started (04/2020) -she was required much more assistance. She required prompting for almost all of her tasks. She had to label anything in the home. Have caregivers 4 hours solid everyday in addition to Meal escorts, medication management 4x a day. She can feed herself but had to be prompted to eat. She was received occupational, speech, and PT- walked with rolling walker.  -just had visit on 10/18/20 and MMSE down to 13/30 from 17/30- still cannot tolerate meds. Also was having a rough day that day.  -next visit planned in February -for migraines was started on ubrelvy- has had a pattern of ups and downs with headaches. Per notes - belived patient tried triptans in past without relief. Has not had a chance yet to use this- sometimes seems headaches send her into periods of feeling poorly overall   #History of both right and left breast cancer followed by Dr. 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 rate controlled. Not anticoagualated due to subretinal hemorrhage history which was really hard on her- thankfully that has been stable lately.   #Bladder Control/overactive bladder S:Patient reported bladder control worsened in 08/2019. Worse over last month or two. More issues with incontinence. Dr. Elease Hashimoto seen her on 07/06/19 and UA was normal. She did not cut down on caffeine. -Mybetriq 50 mg daily - also wearing pads A/P:better than it was- continue current meds  #hypothyroidism S: compliant On thyroid medication- Levothyroxine 88MCG daily- timed may have been off at [facility]- for last month been better   Lab Results  Component Value Date   TSH 2.33 05/05/2020  A/P:Stable. Continue current medications.    #hyperlipidemia S: Medication:Rosuvastatin 10 mg daily  Lab Results  Component Value Date   CHOL 212 (H) 05/05/2020   HDL 40.10 05/05/2020   LDLCALC 124 (H) 06/21/2013   LDLDIRECT 101.0 05/05/2020   TRIG (H) 05/05/2020    483.0 Triglyceride is over 400; calculations on Lipids are invalid.   CHOLHDL 5 05/05/2020   A/P: stable with mild poor control- at her age and with dementia we prefer not to adjust further  #hypertension S: medication: Nadolol '10MG'$  daily-half of 20 mg tablet BP Readings  from Last 3 Encounters:  11/03/20 (!) 156/84  10/18/20 (!) 158/85  05/24/20 120/69  A/P: mild poor control in office and at neurology recently. We are going to try nadolol '20mg'$  and they will update me in 2-3 weeks with how readings are doing at home. I think her heart rate can tolerate this change  #Chronic kidney disease stage III S:  GFR is typically in the high 40srange -Patient knows to avoid NSAIDs  A/P:  hopefully stable update with labs today   # Depression S: Medication:Venlafaxine XR '150MG'$  daily A/P: full remission- continue current meds  # GERD S:Medication: Pepcid '20Mg'$  twice a day. Occasional breakthrough- recommended trial Tums - not having to use lately  -Prefer to avoid PPI if possible due to kidney and memory risks A/P: doing well- continue current meds   #Osteoporosis-Dr. Cruzita Lederer last visit March 2019-recommended follow-up August 11, 2019 had not been- opted out for now- Tried to focus on avoiding falls- glad she was using rolling walker  #declines bloodwork for now  Recommended follow up: No follow-ups on file. Future Appointments  Date Time Provider [place]  04/23/2021  2:30 PM Ward Givens, NP [facility] None   Lab/Order associations:NOT fasting   ICD-10-CM   1. Gastroesophageal reflux disease without esophagitis  K21.9     2. Preventative health care  Z00.00     3. Essential hypertension  I10     4. Hypothyroidism, unspecified type  E03.9     5. Hyperlipidemia, unspecified hyperlipidemia type  E78.5     6. Major depressive disorder with single episode, in full remission ([place])  F32.5     7. Stage 3 chronic kidney disease, unspecified whether stage 3a or 3b CKD (HCC)  N18.30     8. Paroxysmal atrial fibrillation (H[facility])  I48.0     9. Osteopenia, unspecified location  M85.80     10. Chronic deep vein thrombosis (DVT) of other vein of right lower extremity (HCC)  I82.591     11. Overactive bladder  N32.81      No orders of the defined types were placed in this encounter.  I,Jada Bradford,acting as a scribe for [name], MD.,have documented all relevant documentation on the behalf of [name], MD,as directed by  [name], MD while in the presence of [name], MD.  I, [name], MD, have reviewed all documentation for this visit. The documentation on 11/03/20 for the exam, diagnosis, procedures, and orders are all accurate and  complete.  Return precautions advised.  [name], MD

## 2020-11-03 ENCOUNTER — Other Ambulatory Visit: Payer: Self-pay

## 2020-11-03 ENCOUNTER — Ambulatory Visit (INDEPENDENT_AMBULATORY_CARE_PROVIDER_SITE_OTHER): Payer: Medicare Other | Admitting: Family Medicine

## 2020-11-03 ENCOUNTER — Encounter: Payer: Self-pay | Admitting: Family Medicine

## 2020-11-03 VITALS — BP 152/82 | HR 71 | Temp 97.0°F | Ht 67.0 in | Wt 146.2 lb

## 2020-11-03 DIAGNOSIS — I480 Paroxysmal atrial fibrillation: Secondary | ICD-10-CM

## 2020-11-03 DIAGNOSIS — F325 Major depressive disorder, single episode, in full remission: Secondary | ICD-10-CM

## 2020-11-03 DIAGNOSIS — I10 Essential (primary) hypertension: Secondary | ICD-10-CM | POA: Diagnosis not present

## 2020-11-03 DIAGNOSIS — N3281 Overactive bladder: Secondary | ICD-10-CM

## 2020-11-03 DIAGNOSIS — M810 Age-related osteoporosis without current pathological fracture: Secondary | ICD-10-CM | POA: Diagnosis not present

## 2020-11-03 DIAGNOSIS — I82591 Chronic embolism and thrombosis of other specified deep vein of right lower extremity: Secondary | ICD-10-CM

## 2020-11-03 DIAGNOSIS — Z0000 Encounter for general adult medical examination without abnormal findings: Secondary | ICD-10-CM

## 2020-11-03 DIAGNOSIS — K219 Gastro-esophageal reflux disease without esophagitis: Secondary | ICD-10-CM | POA: Diagnosis not present

## 2020-11-03 DIAGNOSIS — E785 Hyperlipidemia, unspecified: Secondary | ICD-10-CM

## 2020-11-03 DIAGNOSIS — M8580 Other specified disorders of bone density and structure, unspecified site: Secondary | ICD-10-CM

## 2020-11-03 DIAGNOSIS — N183 Chronic kidney disease, stage 3 unspecified: Secondary | ICD-10-CM | POA: Diagnosis not present

## 2020-11-03 DIAGNOSIS — E039 Hypothyroidism, unspecified: Secondary | ICD-10-CM

## 2020-11-03 MED ORDER — NADOLOL 20 MG PO TABS: 20.0000 mg | ORAL_TABLET | Freq: Every day | ORAL | 3 refills | Status: DC

## 2020-11-03 NOTE — Patient Instructions (Addendum)
Health Maintenance Due  Topic Date Due   Zoster Vaccines- Shingrix (1 of 2)  - Declined at this time.  Never done   TETANUS/TDAP   - Hold off at this time unless you get cut or scrapped.  03/19/2020   COVID-19 Vaccine (4 - Booster for Moderna series)  -Please send [place] the date on the 4th vaccine.   05/12/2020   INFLUENZA VACCINE   - Please consider getting your flu shot in the Fall. If you get this outside of our office, please let us know.  10/09/2020   Schedule your bone density test at check out desk. You may also call directly to X-ray at [phone] to schedule an appointment that is convenient for you.  - located [address] across the street from [place] - in the basement - you do need an appointment for the bone density tests.   Increase Nadolol to full tablets (20 mg) and update me in 2-3 weeks with home readings at home.  Recommended follow up: Return in about 6 months (around 05/06/2021) for follow up- or sooner if needed.

## 2020-11-06 DIAGNOSIS — Z20828 Contact with and (suspected) exposure to other viral communicable diseases: Secondary | ICD-10-CM | POA: Diagnosis not present

## 2020-11-13 DIAGNOSIS — Z8616 Personal history of COVID-19: Secondary | ICD-10-CM | POA: Diagnosis not present

## 2020-11-16 ENCOUNTER — Other Ambulatory Visit: Payer: Self-pay | Admitting: Family Medicine

## 2020-11-17 ENCOUNTER — Telehealth: Payer: Self-pay

## 2020-11-17 NOTE — Telephone Encounter (Signed)
Patient's daughter called in stating that [place] lives at a senior living facility and is showing signs of another yeast infection. Wondering if it would be okay to bring by a urine sample without an appointment, advised that Dr.Hunter was out until Monday but daughter would like to know if orders can be placed as it is difficult to bring her in for an appointment.

## 2020-11-17 NOTE — Telephone Encounter (Signed)
Called daughter back, and she states will take [place] to a local urgent care.

## 2020-11-17 NOTE — Telephone Encounter (Signed)
Please schedule pt at least for a virtual in order for treatment to be done.

## 2020-11-18 ENCOUNTER — Encounter (HOSPITAL_COMMUNITY): Payer: Self-pay

## 2020-11-18 ENCOUNTER — Ambulatory Visit (HOSPITAL_COMMUNITY)
Admission: EM | Admit: 2020-11-18 | Discharge: 2020-11-18 | Disposition: A | Discharge status: Home / Self Care | Payer: Medicare Other | Attending: Emergency Medicine | Admitting: Emergency Medicine

## 2020-11-18 ENCOUNTER — Other Ambulatory Visit: Payer: Self-pay

## 2020-11-18 DIAGNOSIS — R519 Headache, unspecified: Secondary | ICD-10-CM | POA: Diagnosis not present

## 2020-11-18 DIAGNOSIS — R350 Frequency of micturition: Secondary | ICD-10-CM

## 2020-11-18 LAB — POCT URINALYSIS DIPSTICK, ED / UC
Glucose, UA: NEGATIVE mg/dL
Nitrite: NEGATIVE
Protein, ur: >=300 mg/dL — AB
Specific Gravity, Urine: 1.025 (ref 1.005–1.030)
Urobilinogen, UA: 1 mg/dL (ref 0.0–1.0)
pH: 6 (ref 5.0–8.0)

## 2020-11-18 MED ORDER — SUMATRIPTAN SUCCINATE 6 MG/0.5ML ~~LOC~~ SOLN
6.0000 mg | Freq: Once | SUBCUTANEOUS | Status: AC
  Administered 2020-11-18: 6 mg via SUBCUTANEOUS

## 2020-11-18 MED ORDER — ONDANSETRON 4 MG PO TBDP
ORAL_TABLET | ORAL | Status: AC
  Filled 2020-11-18: qty 1

## 2020-11-18 MED ORDER — SUMATRIPTAN SUCCINATE 6 MG/0.5ML ~~LOC~~ SOLN
SUBCUTANEOUS | Status: AC
  Filled 2020-11-18: qty 0.5

## 2020-11-18 MED ORDER — ONDANSETRON 4 MG PO TBDP
4.0000 mg | ORAL_TABLET | Freq: Once | ORAL | Status: AC
  Administered 2020-11-18: 4 mg via ORAL

## 2020-11-18 MED ORDER — ONDANSETRON HCL 4 MG PO TABS: 4.0000 mg | ORAL_TABLET | Freq: Three times a day (TID) | ORAL | 0 refills | Status: DC | PRN

## 2020-11-18 NOTE — ED Provider Notes (Signed)
[name]    CSN: [id number] Arrival date & time: 11/18/20  1009      History   Chief Complaint Chief Complaint  Patient presents with   Urinary Tract Infection    HPI Laura Li is a 80 y.o. female.   Patient presents with intermittent posterior and frontal headache that has persisted for 1 week, associated nausea.  Has had similar headaches before but have not persisted for this length of time.  History of aneurysm, recently evaluated by neurology on August 10, stable.  Concerned with urinary frequency and dark urine 1 week, reported by caregivers.  History of overactive bladder, using myrbetriq daily.   History obtained by daughter, patient has dementia, unable to verbalize symptoms today.   Past Medical History:  Diagnosis Date   Anemia    from medicaions   Arthritis    Chronic kidney disease    some renal impairment from meds   Depression    states no depression   Eczema    Esophageal stricture    GERD (gastroesophageal reflux disease)    Headache    Heart murmur    states it is benign told 30-40 years ago   History of DVT (deep vein thrombosis)    HX: breast cancer    melanoma   Hyperlipidemia    Hypertension    Hypothyroidism    Melanoma ([place])    OA (osteoarthritis)    Personal history of chemotherapy 2016   Left Breast Cancer   Personal history of radiation therapy 2016   Left Breast Cancer   Pneumothorax on right 06/15/2014   PONV (postoperative nausea and vomiting)    Wears glasses     Patient Active Problem List   Diagnosis Date Noted   Disorder of bone and cartilage 05/23/2017   Depression, major, single episode 04/24/2017   MCI (mild cognitive impairment) 10/16/2016   Overactive bladder 09/19/2016   Paroxysmal atrial fibrillation ([place]) 06/04/2016   Acute lower UTI 05/29/2016   Osteopenia 05/23/2016   Chronic deep vein thrombosis (DVT) ([place]) 09/01/2015   Localized swelling, mass, or lump of lower extremity 11/05/2014    Diverticulitis 08/09/2014   History of pneumothorax 06/15/2014   Family history of breast cancer 04/29/2014   History of left breast cancer 04/24/2014   History of right breast cancer 04/24/2014   GERD (gastroesophageal reflux disease) 03/31/2014   Hot flashes 03/31/2014   Osteoarthritis 03/31/2014   Chronic kidney disease, stage III (moderate) ([place]) 12/09/2012   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   History of vasculitis 11/09/2012   Hypothyroidism 02/06/2006   Hyperlipidemia 02/06/2006   Essential hypertension 02/06/2006   Allergic rhinitis 02/06/2006    Past Surgical History:  Procedure Laterality Date   APPENDECTOMY  1967   BREAST EXCISIONAL BIOPSY Left    BREAST LUMPECTOMY Left 2016   BREAST SURGERY  1977   removal of calcified milk gland right breast   CHEST TUBE INSERTION  06/15/2014   CHEST TUBE INSERTION Bilateral 06/15/2014   Procedure: CHEST TUBE INSERTION;  Surgeon: Rolm Bookbinder, MD;  Location: [facility];  Service: General;  Laterality: Bilateral;   COLONOSCOPY     [place]   right breast   MASTECTOMY Right 1998   right-nodes out   MELANOMA EXCISION  2004   right side of face   MOHS SURGERY  2005   right left-basal cell   PORT-A-CATH REMOVAL N/A 10/05/2014   Procedure: REMOVAL PORT-A-CATH;  Surgeon: Rolm Bookbinder, MD;  Location:  [facility];  Service: General;  Laterality: N/A;   PORTACATH PLACEMENT Left 06/15/2014   Procedure: INSERTION PORT-A-CATH;  Surgeon: Rolm Bookbinder, MD;  Location: [place];  Service: General;  Laterality: Left;   RADIOACTIVE SEED GUIDED PARTIAL MASTECTOMY WITH AXILLARY SENTINEL LYMPH NODE BIOPSY Left 05/05/2014   Procedure: RADIOACTIVE SEED GUIDED LEFT LUMPECTOMY WITH AXILLARY SENTINEL LYMPH NODE BIOPSY;  Surgeon: Rolm Bookbinder, MD;  Location: [facility];  Service: General;  Laterality: Left;   [place]   including  ovaries    OB History     Gravida  1   Para  1   Term  1   Preterm      AB      Living  1      SAB      IAB      Ectopic      Multiple      Live Births  1            Home Medications    Prior to Admission medications   Medication Sig Start Date End Date Taking? Authorizing Provider  ondansetron (ZOFRAN) 4 MG tablet Take 1 tablet (4 mg total) by mouth every 8 (eight) hours as needed for nausea or vomiting. 11/18/20  Yes Ronda Rajkumar, Leitha Schuller, NP  acetaminophen (TYLENOL) 500 MG tablet Take 500 mg by mouth daily as needed for mild pain.    [provider]  atorvastatin (LIPITOR) 20 MG tablet Take 20 mg by mouth daily.    [provider]  B Complex-C-E-Zn (B COMPLEX-C-E-ZINC) tablet Take 1 tablet by mouth daily.    [provider]  Calcium-Vitamin D (CALTRATE 600 PLUS-VIT D PO) Take 1 tablet by mouth 2 (two) times daily.    [provider]  carboxymethylcellulose (REFRESH PLUS) 0.5 % SOLN 2 drops 3 (three) times daily as needed (dry eyes).    [provider]  famotidine (PEPCID) 20 MG tablet Take 1 tablet (20 mg total) by mouth 2 (two) times daily. 02/10/19   Marin Olp, MD  ipratropium (ATROVENT) 0.03 % nasal spray Place 2 sprays into the nose every 12 (twelve) hours. 04/02/18   Marin Olp, MD  Lactobacillus-Inulin ([place] PO) Take 1 capsule by mouth daily.    [provider]  levothyroxine (SYNTHROID) 88 MCG tablet TAKE 1 TABLET(88 MCG) BY MOUTH DAILY 11/16/20   Marin Olp, MD  Multiple Vitamins-Minerals (CENTRUM SILVER PO) Take 1 tablet by mouth daily.    [provider]  MYRBETRIQ 50 MG TB24 tablet TAKE 1 TABLET(50 MG) BY MOUTH DAILY 09/05/20   Marin Olp, MD  nadolol (CORGARD) 20 MG tablet Take 1 tablet (20 mg total) by mouth daily. 11/03/20   Marin Olp, MD  rosuvastatin (CRESTOR) 10 MG tablet Take 1 tablet (10 mg total) by mouth daily. 05/05/20   Marin Olp, MD  Ubrogepant (UBRELVY) 100 MG TABS Take 1 tablet at the onset of migraine, repeat in 2 hours if headache does not resolve 10/18/20   Ward Givens, NP  venlafaxine XR (EFFEXOR-XR) 150 MG 24 hr capsule TAKE 1 CAPSULE(150 MG) BY MOUTH DAILY Patient taking differently: Take 150 mg by mouth daily with breakfast. 04/18/20   Marin Olp, MD  vitamin B-12 (CYANOCOBALAMIN) 100 MCG tablet Take 100 mcg by mouth daily.    [provider]    Family History Family History  Problem Relation Age of Onset   Heart disease Father 74       smoker   Heart attack Father    Endometrial cancer Mother 46   Breast cancer Cousin        x 3 cousins with breast cancer (one at 42, one at 80 and one at 60)   Lung cancer Paternal Aunt        smoker   Brain cancer Maternal Aunt    Diabetes Maternal Aunt        ???   Breast cancer Maternal Aunt 1   Healthy Sister    Tuberculosis Maternal Grandfather    Cervical cancer Paternal Grandmother    Healthy Daughter    Colon cancer Neg Hx    Amblyopia Neg Hx    Blindness Neg Hx    Cataracts Neg Hx    Glaucoma Neg Hx    Macular degeneration Neg Hx    Retinal detachment Neg Hx    Strabismus Neg Hx    Retinitis pigmentosa Neg Hx     Social History Social History   Tobacco Use   Smoking status: Former    Packs/day: 0.75    Years: 3.00    Pack years: 2.25    Types: Cigarettes    Quit date: 03/11/1969    Years since quitting: 51.7   Smokeless tobacco: Never  Vaping Use   Vaping Use: Never used  Substance Use Topics   Alcohol use: Yes    Alcohol/week: 0.0 standard drinks    Comment: occasional wine   Drug use: No     Allergies   Aricept [donepezil hcl], Dyazide [hydrochlorothiazide w-triamterene], Namenda [memantine], Niacin, and Penicillins   Review of Systems Review of Systems  Unable to perform ROS: Dementia    Physical Exam Triage Vital Signs ED Triage Vitals  Enc Vitals Group     BP 11/18/20 1042 (!) 178/112      Pulse Rate 11/18/20 1042 88     Resp 11/18/20 1042 19     Temp 11/18/20 1042 98.3 F (36.8 C)     Temp Source 11/18/20 1042 Oral     SpO2 11/18/20 1042 96 %     Weight --      Height --      Head Circumference --      Peak Flow --      Pain Score 11/18/20 1041 10     Pain Loc --      Pain Edu? --      Excl. in [place]? --    No data found.  Updated Vital Signs BP (!) 178/112 (BP Location: Left Arm) Comment: Pt taking BP meds. Dosage recently raised  Pulse 88   Temp 98.3 F (36.8 C) (Oral)   Resp 19   SpO2 96%   Visual Acuity Right Eye Distance:   Left Eye Distance:   Bilateral Distance:    Right Eye Near:   Left Eye Near:    Bilateral Near:     Physical Exam Constitutional:      Appearance: Normal appearance. She is normal weight.  HENT:     Head: Normocephalic.  Eyes:     Extraocular Movements: Extraocular movements intact.  Pulmonary:     Effort: Pulmonary effort is normal.  Skin:    General: Skin is warm and dry.  Neurological:     General: No focal deficit present.     Mental Status: She is alert and oriented to person, place, and time.  Mental status is at baseline.     Cranial Nerves: No cranial nerve deficit.     Sensory: No sensory deficit.     Motor: No weakness.     Coordination: Coordination normal.  Psychiatric:        Mood and Affect: Mood normal.        Behavior: Behavior normal.     UC Treatments / Results  Labs (all labs ordered are listed, but only abnormal results are displayed) Labs Reviewed  POCT URINALYSIS DIPSTICK, ED / UC - Abnormal; Notable for the following components:      Result Value   Bilirubin Urine SMALL (*)    Ketones, ur TRACE (*)    Hgb urine dipstick TRACE (*)    Protein, ur >=300 (*)    Leukocytes,Ua SMALL (*)    All other components within normal limits  URINE CULTURE    EKG   Radiology No results found.  Procedures Procedures (including critical care time)  Medications Ordered in UC Medications   SUMAtriptan (IMITREX) injection 6 mg (has no administration in time range)  ondansetron (ZOFRAN-ODT) disintegrating tablet 4 mg (has no administration in time range)    Initial Impression / Assessment and Plan / UC Course  I have reviewed the triage vital signs and the nursing notes.  Pertinent labs & imaging results that were available during my care of the patient were reviewed by me and considered in my medical decision making (see chart for details).  Bad headache Urinary frequency  Patient neurologically intact, verbalized feeling nauseous during exam, says headache is not bad today, blood pressure elevated, per daughter baseline is 150s over 90s, medication recently adjusted in August by primary care doctor, advised to monitor over the next 3 days and if elevated to notify primary care doctor for evaluation, urinalysis showing small Eleena Grater blood cells, sent for culture, will manage conservatively and hold off on antibiotics until culture results, daughter in agreement with plan, patient given Imitrex 6 mg IM and Zofran 4 mg ODT in office, will manage headaches with Tylenol outpatient and will follow-up with neurology as needed, prescription for Zofran 4 mg every 8 hours sent to pharmacy for patient Final Clinical Impressions(s) / UC Diagnoses   Final diagnoses:  Bad headache  Urinary frequency     Discharge Instructions      Your urinalysis today shows a Ekin Pilar blood cells however it did not show.  Therefore it will be sent to the lab to see if bacteria grows, if this occurs you will be notified and an antibiotic will be sent in for treatment  You may continue use of Tylenol at home as needed to help manage headache, in office today you were given a shot of Imitrex and under the tongue Zofran  You can use Zofran every 8 hours as needed to help with nausea at home  Please monitor pressure for the next 3 days while at home to ensure blood pressure is not staying elevated, if this  occurs please follow-up with primary care doctor soon as possible for reevaluation     ED Prescriptions     Medication Sig Dispense Auth. Provider   ondansetron (ZOFRAN) 4 MG tablet Take 1 tablet (4 mg total) by mouth every 8 (eight) hours as needed for nausea or vomiting. 20 tablet Hans Eden, NP      PDMP not reviewed this encounter.   Hans Eden, NP 11/18/20 1120

## 2020-11-18 NOTE — Discharge Instructions (Signed)
Your urinalysis today shows a Reve Crocket blood cells however it did not show.  Therefore it will be sent to the lab to see if bacteria grows, if this occurs you will be notified and an antibiotic will be sent in for treatment  You may continue use of Tylenol at home as needed to help manage headache, in office today you were given a shot of Imitrex and under the tongue Zofran  You can use Zofran every 8 hours as needed to help with nausea at home  Please monitor pressure for the next 3 days while at home to ensure blood pressure is not staying elevated, if this occurs please follow-up with primary care doctor soon as possible for reevaluation

## 2020-11-18 NOTE — ED Triage Notes (Signed)
Pt presents with daughter. Daughter states she has had sever headaches. Pt presents with urinary frequency and cloudy urine.

## 2020-11-19 LAB — URINE CULTURE: Culture: NO GROWTH

## 2020-11-20 DIAGNOSIS — Z8616 Personal history of COVID-19: Secondary | ICD-10-CM | POA: Diagnosis not present

## 2020-11-27 DIAGNOSIS — Z8616 Personal history of COVID-19: Secondary | ICD-10-CM | POA: Diagnosis not present

## 2020-11-29 ENCOUNTER — Encounter: Payer: Self-pay | Admitting: Family Medicine

## 2020-12-01 ENCOUNTER — Other Ambulatory Visit: Payer: Self-pay

## 2020-12-01 MED ORDER — AMLODIPINE BESYLATE 2.5 MG PO TABS: 2.5000 mg | ORAL_TABLET | Freq: Every day | ORAL | 3 refills | Status: DC

## 2020-12-01 NOTE — Telephone Encounter (Signed)
Script has been printed and faxed. Left a message on the Nurses phone to give me a call back.

## 2020-12-01 NOTE — Telephone Encounter (Signed)
Unable to reach Laura Li left a message to return calls.

## 2020-12-01 NOTE — Telephone Encounter (Signed)
Laura Li from [facility] returning a call.

## 2020-12-01 NOTE — Telephone Encounter (Signed)
See below

## 2020-12-04 DIAGNOSIS — Z20828 Contact with and (suspected) exposure to other viral communicable diseases: Secondary | ICD-10-CM | POA: Diagnosis not present

## 2020-12-07 DIAGNOSIS — Z23 Encounter for immunization: Secondary | ICD-10-CM | POA: Diagnosis not present

## 2020-12-11 DIAGNOSIS — Z8616 Personal history of COVID-19: Secondary | ICD-10-CM | POA: Diagnosis not present

## 2020-12-18 DIAGNOSIS — Z8616 Personal history of COVID-19: Secondary | ICD-10-CM | POA: Diagnosis not present

## 2020-12-25 DIAGNOSIS — Z20828 Contact with and (suspected) exposure to other viral communicable diseases: Secondary | ICD-10-CM | POA: Diagnosis not present

## 2021-01-01 DIAGNOSIS — Z8616 Personal history of COVID-19: Secondary | ICD-10-CM | POA: Diagnosis not present

## 2021-01-03 ENCOUNTER — Encounter: Payer: Self-pay | Admitting: Family Medicine

## 2021-01-08 DIAGNOSIS — Z8616 Personal history of COVID-19: Secondary | ICD-10-CM | POA: Diagnosis not present

## 2021-01-13 ENCOUNTER — Other Ambulatory Visit: Payer: Self-pay | Admitting: Nurse Practitioner

## 2021-01-15 DIAGNOSIS — Z20822 Contact with and (suspected) exposure to covid-19: Secondary | ICD-10-CM | POA: Diagnosis not present

## 2021-01-22 DIAGNOSIS — Z20828 Contact with and (suspected) exposure to other viral communicable diseases: Secondary | ICD-10-CM | POA: Diagnosis not present

## 2021-02-05 DIAGNOSIS — Z1159 Encounter for screening for other viral diseases: Secondary | ICD-10-CM | POA: Diagnosis not present

## 2021-02-05 DIAGNOSIS — Z20828 Contact with and (suspected) exposure to other viral communicable diseases: Secondary | ICD-10-CM | POA: Diagnosis not present

## 2021-02-12 DIAGNOSIS — Z20828 Contact with and (suspected) exposure to other viral communicable diseases: Secondary | ICD-10-CM | POA: Diagnosis not present

## 2021-02-12 DIAGNOSIS — Z1159 Encounter for screening for other viral diseases: Secondary | ICD-10-CM | POA: Diagnosis not present

## 2021-02-19 DIAGNOSIS — Z20828 Contact with and (suspected) exposure to other viral communicable diseases: Secondary | ICD-10-CM | POA: Diagnosis not present

## 2021-02-19 DIAGNOSIS — Z1159 Encounter for screening for other viral diseases: Secondary | ICD-10-CM | POA: Diagnosis not present

## 2021-02-21 ENCOUNTER — Other Ambulatory Visit: Payer: Self-pay | Admitting: Family Medicine

## 2021-02-21 DIAGNOSIS — Z20828 Contact with and (suspected) exposure to other viral communicable diseases: Secondary | ICD-10-CM | POA: Diagnosis not present

## 2021-02-21 DIAGNOSIS — Z1159 Encounter for screening for other viral diseases: Secondary | ICD-10-CM | POA: Diagnosis not present

## 2021-02-23 DIAGNOSIS — Z20828 Contact with and (suspected) exposure to other viral communicable diseases: Secondary | ICD-10-CM | POA: Diagnosis not present

## 2021-02-23 DIAGNOSIS — Z1159 Encounter for screening for other viral diseases: Secondary | ICD-10-CM | POA: Diagnosis not present

## 2021-02-26 DIAGNOSIS — Z20828 Contact with and (suspected) exposure to other viral communicable diseases: Secondary | ICD-10-CM | POA: Diagnosis not present

## 2021-02-26 DIAGNOSIS — Z1159 Encounter for screening for other viral diseases: Secondary | ICD-10-CM | POA: Diagnosis not present

## 2021-02-28 DIAGNOSIS — Z20828 Contact with and (suspected) exposure to other viral communicable diseases: Secondary | ICD-10-CM | POA: Diagnosis not present

## 2021-02-28 DIAGNOSIS — Z1159 Encounter for screening for other viral diseases: Secondary | ICD-10-CM | POA: Diagnosis not present

## 2021-03-02 DIAGNOSIS — Z20822 Contact with and (suspected) exposure to covid-19: Secondary | ICD-10-CM | POA: Diagnosis not present

## 2021-03-03 ENCOUNTER — Other Ambulatory Visit: Payer: Self-pay | Admitting: Family Medicine

## 2021-03-05 DIAGNOSIS — Z20828 Contact with and (suspected) exposure to other viral communicable diseases: Secondary | ICD-10-CM | POA: Diagnosis not present

## 2021-03-05 DIAGNOSIS — Z1159 Encounter for screening for other viral diseases: Secondary | ICD-10-CM | POA: Diagnosis not present

## 2021-03-07 DIAGNOSIS — Z1159 Encounter for screening for other viral diseases: Secondary | ICD-10-CM | POA: Diagnosis not present

## 2021-03-07 DIAGNOSIS — Z20828 Contact with and (suspected) exposure to other viral communicable diseases: Secondary | ICD-10-CM | POA: Diagnosis not present

## 2021-03-14 DIAGNOSIS — Z1159 Encounter for screening for other viral diseases: Secondary | ICD-10-CM | POA: Diagnosis not present

## 2021-03-14 DIAGNOSIS — Z20828 Contact with and (suspected) exposure to other viral communicable diseases: Secondary | ICD-10-CM | POA: Diagnosis not present

## 2021-03-19 DIAGNOSIS — Z20828 Contact with and (suspected) exposure to other viral communicable diseases: Secondary | ICD-10-CM | POA: Diagnosis not present

## 2021-03-21 DIAGNOSIS — Z1159 Encounter for screening for other viral diseases: Secondary | ICD-10-CM | POA: Diagnosis not present

## 2021-03-21 DIAGNOSIS — Z20828 Contact with and (suspected) exposure to other viral communicable diseases: Secondary | ICD-10-CM | POA: Diagnosis not present

## 2021-03-26 DIAGNOSIS — Z20822 Contact with and (suspected) exposure to covid-19: Secondary | ICD-10-CM | POA: Diagnosis not present

## 2021-04-02 DIAGNOSIS — Z20822 Contact with and (suspected) exposure to covid-19: Secondary | ICD-10-CM | POA: Diagnosis not present

## 2021-04-09 DIAGNOSIS — Z20828 Contact with and (suspected) exposure to other viral communicable diseases: Secondary | ICD-10-CM | POA: Diagnosis not present

## 2021-04-11 DIAGNOSIS — R2689 Other abnormalities of gait and mobility: Secondary | ICD-10-CM | POA: Diagnosis not present

## 2021-04-11 DIAGNOSIS — M6281 Muscle weakness (generalized): Secondary | ICD-10-CM | POA: Diagnosis not present

## 2021-04-11 DIAGNOSIS — M25561 Pain in right knee: Secondary | ICD-10-CM | POA: Diagnosis not present

## 2021-04-11 DIAGNOSIS — M25562 Pain in left knee: Secondary | ICD-10-CM | POA: Diagnosis not present

## 2021-04-12 ENCOUNTER — Encounter: Payer: Self-pay | Admitting: Adult Health

## 2021-04-12 DIAGNOSIS — M6281 Muscle weakness (generalized): Secondary | ICD-10-CM | POA: Diagnosis not present

## 2021-04-12 DIAGNOSIS — R2689 Other abnormalities of gait and mobility: Secondary | ICD-10-CM | POA: Diagnosis not present

## 2021-04-12 DIAGNOSIS — M25562 Pain in left knee: Secondary | ICD-10-CM | POA: Diagnosis not present

## 2021-04-12 DIAGNOSIS — M25561 Pain in right knee: Secondary | ICD-10-CM | POA: Diagnosis not present

## 2021-04-16 DIAGNOSIS — R2689 Other abnormalities of gait and mobility: Secondary | ICD-10-CM | POA: Diagnosis not present

## 2021-04-16 DIAGNOSIS — Z20822 Contact with and (suspected) exposure to covid-19: Secondary | ICD-10-CM | POA: Diagnosis not present

## 2021-04-16 DIAGNOSIS — M6281 Muscle weakness (generalized): Secondary | ICD-10-CM | POA: Diagnosis not present

## 2021-04-16 DIAGNOSIS — M25561 Pain in right knee: Secondary | ICD-10-CM | POA: Diagnosis not present

## 2021-04-16 DIAGNOSIS — M25562 Pain in left knee: Secondary | ICD-10-CM | POA: Diagnosis not present

## 2021-04-18 DIAGNOSIS — R2689 Other abnormalities of gait and mobility: Secondary | ICD-10-CM | POA: Diagnosis not present

## 2021-04-18 DIAGNOSIS — M25562 Pain in left knee: Secondary | ICD-10-CM | POA: Diagnosis not present

## 2021-04-18 DIAGNOSIS — M25561 Pain in right knee: Secondary | ICD-10-CM | POA: Diagnosis not present

## 2021-04-18 DIAGNOSIS — M6281 Muscle weakness (generalized): Secondary | ICD-10-CM | POA: Diagnosis not present

## 2021-04-19 DIAGNOSIS — M6281 Muscle weakness (generalized): Secondary | ICD-10-CM | POA: Diagnosis not present

## 2021-04-19 DIAGNOSIS — R2689 Other abnormalities of gait and mobility: Secondary | ICD-10-CM | POA: Diagnosis not present

## 2021-04-19 DIAGNOSIS — M25562 Pain in left knee: Secondary | ICD-10-CM | POA: Diagnosis not present

## 2021-04-19 DIAGNOSIS — M25561 Pain in right knee: Secondary | ICD-10-CM | POA: Diagnosis not present

## 2021-04-23 ENCOUNTER — Ambulatory Visit (INDEPENDENT_AMBULATORY_CARE_PROVIDER_SITE_OTHER): Payer: Medicare Other | Admitting: Adult Health

## 2021-04-23 VITALS — BP 151/81 | HR 66 | Ht 67.0 in | Wt 156.2 lb

## 2021-04-23 DIAGNOSIS — F0390 Unspecified dementia without behavioral disturbance: Secondary | ICD-10-CM | POA: Diagnosis not present

## 2021-04-23 NOTE — Progress Notes (Signed)
PATIENT: Laura Li DOB: [birth year]  REASON FOR VISIT: follow up HISTORY FROM: patient Primary neurologist: Dr. Brett Fairy  HISTORY OF PRESENT ILLNESS: Today 04/23/21:  Laura Li is an 81 year old female with a history of memory disturbance.  She returns today for follow-up.  She is here today with her daughter.  She continues to live at [facility] in the independent living section.  She does have a caregiver with her 12 hours a day.  She reports that she can complete ADLs independently.  She is no longer on Aricept and Namenda.  Denies any trouble with her sleep.  Reports good appetite.  No change in mood or behavior.  Reports that she has not had any additional headaches.  It is felt that her headaches were due to spikes in her blood pressure  10/18/20: Laura Li is an 81 year old female with a history of memory disturbance.  She returns today for follow-up.  She continues to live at [facility].  The patient feels that her memory has improved.  She reports that she is able to complete all ADLs independently.  But her daughter clarifies that they have a caregiver with her 9 hours a day and sometimes they do assist with ADLs.  The patient reports that she is sleeping okay.  The facility dispenses her medications.  Reports that she sleeps okay.  She was unable to tolerate Aricept and Namenda  Patient also reports that she has a history of headaches.  She has seen a headache specialist in the past.  She feels that she may have been on triptans in the past but does not remember which one.  The daughter states that about every 3 weeks she will have a severe headache that will result in nausea and vomiting and could last 1 to 2 days.  The patient states that sometimes if she takes Tylenol it is beneficial but not always.  She has a history of blood clots.  She returns today for an evaluation.  04/20/20: Laura Li is an 81 year old female with a history of memory disturbance.  She  returns today for follow-up.  She continues to live at [facility].  She is here with her daughter.  Daughter has noticed a significant change in her ability to perform household duties.  She states that she has have a close picked out.  She is often put her clothes on incorrectly.  Everything in her apartment is labeled.  [facility] with now manages all of her medications.  She is gotten lost in the facility.  She does not operate a motor vehicle.  Her mood has remained pleasant.  She tried Aricept in the past but it resulted in itching.  Patient returns today for an evaluation.  Laura Li is an 81 year old female with a history of memory disturbance.  She returns today for follow-up.  She continues to live at [facility] and independent living.  She reports that her memory may be slightly worse.  She is able to complete all ADLs independently.  She does not operate a motor vehicle.  Denies any trouble sleeping.  She no longer does any cooking.  She reports at times she feels sad.  Her daughter reports that she gets confused with where she is living.  The daughter continues to notice some subtle changes with her memory.  She is currently not on any memory medication.  She returns today for an evaluation.  HISTORY 04/12/19:   Laura Li is an 81 year old female with  a history of mild cognitive impairment.  She returns today for follow-up. She is here today with her daughter. She feels that her memory is worse. She continues to live [facility]. She completes all ADLs independently. Her daughter helps her with her finances. She eats in the dining room. She does not make any meals. She does not operate a motor vehicle. She reports that there are times that she is slightly depressed but daughter feels that this is mild. She is currently on Effexor. Daughter reports that there is a certain man that lives across the hallway from the patient and she often gets him confused. She states that there are multiple men  name "Collier Salina." The daughter has noticed more confusion with certain things. Patient returns today for an evaluation.    REVIEW OF SYSTEMS: Out of a complete 14 system review of symptoms, the patient complains only of the following symptoms, and all other reviewed systems are negative.  See HPI  ALLERGIES: Allergies  Allergen Reactions   Aricept [Donepezil Hcl] Itching   Dyazide [Hydrochlorothiazide W-Triamterene]     Presumption: drug induced vasculitis   Namenda [Memantine]    Niacin Other (See Comments)    flushing   Penicillins Diarrhea and Nausea And Vomiting    HOME MEDICATIONS: Outpatient Medications Prior to Visit  Medication Sig Dispense Refill   acetaminophen (TYLENOL) 500 MG tablet Take 500 mg by mouth daily as needed for mild pain.     amLODipine (NORVASC) 2.5 MG tablet TAKE 1 TABLET(2.5 MG) BY MOUTH DAILY 90 tablet 3   B Complex-C-E-Zn (B COMPLEX-C-E-ZINC) tablet Take 1 tablet by mouth daily.     Calcium-Vitamin D (CALTRATE 600 PLUS-VIT D PO) Take 1 tablet by mouth 2 (two) times daily.     carboxymethylcellulose (REFRESH PLUS) 0.5 % SOLN 2 drops 3 (three) times daily as needed (dry eyes).     famotidine (PEPCID) 20 MG tablet Take 1 tablet (20 mg total) by mouth 2 (two) times daily. 60 tablet 5   Lactobacillus-Inulin (CULTURELLE DIGESTIVE HEALTH PO) Take 1 capsule by mouth daily.     levothyroxine (SYNTHROID) 88 MCG tablet TAKE 1 TABLET(88 MCG) BY MOUTH DAILY 90 tablet 3   Multiple Vitamins-Minerals (CENTRUM SILVER PO) Take 1 tablet by mouth daily.     MYRBETRIQ 50 MG TB24 tablet TAKE 1 TABLET(50 MG) BY MOUTH DAILY 30 tablet 5   nadolol (CORGARD) 20 MG tablet Take 1 tablet (20 mg total) by mouth daily. 90 tablet 3   rosuvastatin (CRESTOR) 10 MG tablet Take 1 tablet (10 mg total) by mouth daily. 90 tablet 3   venlafaxine XR (EFFEXOR-XR) 150 MG 24 hr capsule TAKE 1 CAPSULE(150 MG) BY MOUTH DAILY (Patient taking differently: Take 150 mg by mouth daily with breakfast.) 90  capsule 3   vitamin B-12 (CYANOCOBALAMIN) 100 MCG tablet Take 100 mcg by mouth daily.     atorvastatin (LIPITOR) 20 MG tablet Take 20 mg by mouth daily.     ipratropium (ATROVENT) 0.03 % nasal spray Place 2 sprays into the nose every 12 (twelve) hours. 180 mL 3   ondansetron (ZOFRAN) 4 MG tablet Take 1 tablet (4 mg total) by mouth every 8 (eight) hours as needed for nausea or vomiting. 20 tablet 0   Ubrogepant (UBRELVY) 100 MG TABS Take 1 tablet at the onset of migraine, repeat in 2 hours if headache does not resolve 15 tablet 5   Facility-Administered Medications Prior to Visit  Medication Dose Route Frequency Provider Last Rate Last  Admin   Bevacizumab (AVASTIN) SOLN 1.25 mg  1.25 mg Intravitreal  Bernarda Caffey, MD   1.25 mg at 01/04/18 2328   Bevacizumab (AVASTIN) SOLN 1.25 mg  1.25 mg Intravitreal  Bernarda Caffey, MD   1.25 mg at 01/30/18 1230   Bevacizumab (AVASTIN) SOLN 1.25 mg  1.25 mg Intravitreal  Bernarda Caffey, MD   1.25 mg at 02/27/18 1315   Bevacizumab (AVASTIN) SOLN 1.25 mg  1.25 mg Intravitreal  Bernarda Caffey, MD   1.25 mg at 03/27/18 1138    PAST MEDICAL HISTORY: Past Medical History:  Diagnosis Date   Anemia    from medicaions   Arthritis    Chronic kidney disease    some renal impairment from meds   Depression    states no depression   Eczema    Esophageal stricture    GERD (gastroesophageal reflux disease)    Headache    Heart murmur    states it is benign told 30-40 years ago   History of DVT (deep vein thrombosis)    HX: breast cancer    melanoma   Hyperlipidemia    Hypertension    Hypothyroidism    Melanoma ([place])    OA (osteoarthritis)    Personal history of chemotherapy 2016   Left Breast Cancer   Personal history of radiation therapy 2016   Left Breast Cancer   Pneumothorax on right 06/15/2014   PONV (postoperative nausea and vomiting)    Wears glasses     PAST SURGICAL HISTORY: Past Surgical History:  Procedure Laterality Date   APPENDECTOMY   1967   BREAST EXCISIONAL BIOPSY Left    BREAST LUMPECTOMY Left 2016   BREAST SURGERY  1977   removal of calcified milk gland right breast   CHEST TUBE INSERTION  06/15/2014   CHEST TUBE INSERTION Bilateral 06/15/2014   Procedure: CHEST TUBE INSERTION;  Surgeon: Rolm Bookbinder, MD;  Location: [facility];  Service: General;  Laterality: Bilateral;   COLONOSCOPY     [place]   right breast   MASTECTOMY Right 1998   right-nodes out   MELANOMA EXCISION  2004   right side of face   MOHS SURGERY  2005   right left-basal cell   PORT-A-CATH REMOVAL N/A 10/05/2014   Procedure: REMOVAL PORT-A-CATH;  Surgeon: Rolm Bookbinder, MD;  Location: [facility];  Service: General;  Laterality: N/A;   PORTACATH PLACEMENT Left 06/15/2014   Procedure: INSERTION PORT-A-CATH;  Surgeon: Rolm Bookbinder, MD;  Location: [facility];  Service: General;  Laterality: Left;   RADIOACTIVE SEED GUIDED PARTIAL MASTECTOMY WITH AXILLARY SENTINEL LYMPH NODE BIOPSY Left 05/05/2014   Procedure: RADIOACTIVE SEED GUIDED LEFT LUMPECTOMY WITH AXILLARY SENTINEL LYMPH NODE BIOPSY;  Surgeon: Rolm Bookbinder, MD;  Location: [facility];  Service: General;  Laterality: Left;   TONSILLECTOMY AND ADENOIDECTOMY  1949   TOTAL ABDOMINAL HYSTERECTOMY  1992   including ovaries    FAMILY HISTORY: Family History  Problem Relation Age of Onset   Heart disease Father 72       smoker   Heart attack Father    Endometrial cancer Mother 29   Breast cancer Cousin        x 3 cousins with breast cancer (one at 49, one at 31 and one at 10)   Lung cancer Paternal Aunt        smoker   Brain cancer Maternal Aunt    Diabetes Maternal Aunt        ???  Breast cancer Maternal Aunt 15   Healthy Sister    Tuberculosis Maternal Grandfather    Cervical cancer Paternal Grandmother    Healthy Daughter    Colon cancer Neg Hx    Amblyopia Neg Hx    Blindness Neg Hx    Cataracts Neg Hx    Glaucoma Neg Hx    Macular  degeneration Neg Hx    Retinal detachment Neg Hx    Strabismus Neg Hx    Retinitis pigmentosa Neg Hx     SOCIAL HISTORY: Social History   Socioeconomic History   Marital status: Widowed    Spouse name: Not on file   Number of children: Not on file   Years of education: Not on file   Highest education level: Not on file  Occupational History   Occupation: retired  Tobacco Use   Smoking status: Former    Packs/day: 0.75    Years: 3.00    Pack years: 2.25    Types: Cigarettes    Quit date: 03/11/1969    Years since quitting: 52.1   Smokeless tobacco: Never  Vaping Use   Vaping Use: Never used  Substance and Sexual Activity   Alcohol use: Yes    Alcohol/week: 0.0 standard drinks    Comment: occasional wine   Drug use: No   Sexual activity: Not Currently    Birth control/protection: None  Other Topics Concern   Not on file  Social History Narrative   Widowed 2003. Moved to [place] from [place] in 2004 after loss of husband to be near daughter. 1 daughter. 2 grandkids.       Retired from working at a church      Hobbies: Training and development officer   Social Determinants of Radio broadcast assistant Strain: Not on [employer] Insecurity: Not on file  Transportation Needs: Not on file  Physical Activity: Not on file  Stress: Not on file  Social Connections: Not on file  Intimate Partner Violence: Not on file      PHYSICAL EXAM  Vitals:   04/23/21 1426  BP: (!) 151/81  Pulse: 66  Weight: 156 lb 3.2 oz (70.9 kg)  Height: 5\' 7"  (1.702 m)   Body mass index is 24.46 kg/m.   MMSE - Mini Mental State Exam 10/18/2020 04/20/2020 10/18/2019  Not completed: - - -  Orientation to time 1 3 3   Orientation to Place 1 1 4   Registration 3 3 3   Attention/ Calculation 0 0 5  Attention/Calculation-comments - - -  Recall 1 2 2   Language- name 2 objects 2 2 2   Language- repeat 1 0 1  Language- follow 3 step command 3 3 3   Language- read & follow direction 0 1 1  Write a sentence 1 1 1   Copy  design 0 1 1  Copy design-comments - - -  Total score 13 17 26      Generalized: Well developed, in no acute distress   Neurological examination  Mentation: Alert oriented to time, place, history taking. Follows all commands speech and language fluent Cranial nerve II-XII: Pupils were equal round reactive to light. Extraocular movements were full, visual field were full on confrontational test. Head turning and shoulder shrug  were normal and symmetric. Motor: The motor testing reveals 5 over 5 strength of all 4 extremities. Good symmetric motor tone is noted throughout.  Sensory: Sensory testing is intact to soft touch on all 4 extremities. No evidence of extinction is noted.  Coordination: Cerebellar testing  reveals good finger-nose-finger and heel-to-shin bilaterally.  Gait and station: Uses a walker when ambulating   DIAGNOSTIC DATA (LABS, IMAGING, TESTING) - I reviewed patient records, labs, notes, testing and imaging myself where available.  Lab Results  Component Value Date   WBC 6.7 05/23/2020   HGB 14.7 05/23/2020   HCT 41.3 05/23/2020   MCV 88.6 05/23/2020   PLT 257 05/23/2020      Component Value Date/Time   NA 137 05/23/2020 2035   NA 141 06/28/2016 1118   K 3.6 05/23/2020 2035   K 4.4 06/28/2016 1118   CL 97 (L) 05/23/2020 2035   CO2 33 (H) 05/23/2020 2035   CO2 26 06/28/2016 1118   GLUCOSE 91 05/23/2020 2035   GLUCOSE 106 06/28/2016 1118   GLUCOSE 88 02/06/2006 1059   BUN 21 05/23/2020 2035   BUN 16.2 06/28/2016 1118   CREATININE 1.16 (H) 05/23/2020 2035   CREATININE 1.05 06/24/2017 1108   CREATININE 1.0 06/28/2016 1118   CALCIUM 10.4 (H) 05/23/2020 2035   CALCIUM 9.9 06/28/2016 1118   PROT 8.5 (H) 05/23/2020 2035   PROT 7.6 06/28/2016 1118   ALBUMIN 4.8 05/23/2020 2035   ALBUMIN 4.2 06/28/2016 1118   AST 27 05/23/2020 2035   AST 24 06/24/2017 1108   AST 21 06/28/2016 1118   ALT 22 05/23/2020 2035   ALT 21 06/24/2017 1108   ALT 17 06/28/2016 1118    ALKPHOS 69 05/23/2020 2035   ALKPHOS 66 06/28/2016 1118   BILITOT 1.1 05/23/2020 2035   BILITOT 0.4 06/24/2017 1108   BILITOT 0.47 06/28/2016 1118   GFRNONAA 47 (L) 05/23/2020 2035   GFRNONAA 50 (L) 06/24/2017 1108   GFRAA 48 (L) 11/25/2019 1134   GFRAA 57 (L) 06/24/2017 1108   Lab Results  Component Value Date   CHOL 212 (H) 05/05/2020   HDL 40.10 05/05/2020   LDLCALC 124 (H) 06/21/2013   LDLDIRECT 101.0 05/05/2020   TRIG (H) 05/05/2020    483.0 Triglyceride is over 400; calculations on Lipids are invalid.   CHOLHDL 5 05/05/2020   No results found for: HGBA1C Lab Results  Component Value Date   QBHALPFX90 240 10/16/2017   Lab Results  Component Value Date   TSH 2.33 05/05/2020      ASSESSMENT AND PLAN 81 y.o. year old female  has a past medical history of Anemia, Arthritis, Chronic kidney disease, Depression, Eczema, Esophageal stricture, GERD (gastroesophageal reflux disease), Headache, Heart murmur, History of DVT (deep vein thrombosis), breast cancer, Hyperlipidemia, Hypertension, Hypothyroidism, Melanoma ([place]), OA (osteoarthritis), Personal history of chemotherapy (2016), Personal history of radiation therapy (2016), Pneumothorax on right (06/15/2014), PONV (postoperative nausea and vomiting), and Wears glasses. here with:  1.  Dementia  Memory stable-unable to tolerate Aricept and Namenda Keep regular follow-ups with PCP Follow-up with our office on an as-needed basis       Ward Givens, MSN, NP-C 04/23/2021, 2:47 PM [facility] [address], [place], [place] [address] [phone]

## 2021-04-24 DIAGNOSIS — M25562 Pain in left knee: Secondary | ICD-10-CM | POA: Diagnosis not present

## 2021-04-24 DIAGNOSIS — M25561 Pain in right knee: Secondary | ICD-10-CM | POA: Diagnosis not present

## 2021-04-24 DIAGNOSIS — M6281 Muscle weakness (generalized): Secondary | ICD-10-CM | POA: Diagnosis not present

## 2021-04-24 DIAGNOSIS — R2689 Other abnormalities of gait and mobility: Secondary | ICD-10-CM | POA: Diagnosis not present

## 2021-04-25 DIAGNOSIS — M25561 Pain in right knee: Secondary | ICD-10-CM | POA: Diagnosis not present

## 2021-04-25 DIAGNOSIS — R2689 Other abnormalities of gait and mobility: Secondary | ICD-10-CM | POA: Diagnosis not present

## 2021-04-25 DIAGNOSIS — M25562 Pain in left knee: Secondary | ICD-10-CM | POA: Diagnosis not present

## 2021-04-25 DIAGNOSIS — M6281 Muscle weakness (generalized): Secondary | ICD-10-CM | POA: Diagnosis not present

## 2021-04-27 DIAGNOSIS — M6281 Muscle weakness (generalized): Secondary | ICD-10-CM | POA: Diagnosis not present

## 2021-04-27 DIAGNOSIS — M25561 Pain in right knee: Secondary | ICD-10-CM | POA: Diagnosis not present

## 2021-04-27 DIAGNOSIS — M25562 Pain in left knee: Secondary | ICD-10-CM | POA: Diagnosis not present

## 2021-04-27 DIAGNOSIS — R2689 Other abnormalities of gait and mobility: Secondary | ICD-10-CM | POA: Diagnosis not present

## 2021-04-30 DIAGNOSIS — M6281 Muscle weakness (generalized): Secondary | ICD-10-CM | POA: Diagnosis not present

## 2021-04-30 DIAGNOSIS — M25561 Pain in right knee: Secondary | ICD-10-CM | POA: Diagnosis not present

## 2021-04-30 DIAGNOSIS — R2689 Other abnormalities of gait and mobility: Secondary | ICD-10-CM | POA: Diagnosis not present

## 2021-04-30 DIAGNOSIS — M25562 Pain in left knee: Secondary | ICD-10-CM | POA: Diagnosis not present

## 2021-05-02 DIAGNOSIS — M25561 Pain in right knee: Secondary | ICD-10-CM | POA: Diagnosis not present

## 2021-05-02 DIAGNOSIS — M25562 Pain in left knee: Secondary | ICD-10-CM | POA: Diagnosis not present

## 2021-05-02 DIAGNOSIS — M6281 Muscle weakness (generalized): Secondary | ICD-10-CM | POA: Diagnosis not present

## 2021-05-02 DIAGNOSIS — R2689 Other abnormalities of gait and mobility: Secondary | ICD-10-CM | POA: Diagnosis not present

## 2021-05-03 DIAGNOSIS — H04123 Dry eye syndrome of bilateral lacrimal glands: Secondary | ICD-10-CM | POA: Diagnosis not present

## 2021-05-03 DIAGNOSIS — H353223 Exudative age-related macular degeneration, left eye, with inactive scar: Secondary | ICD-10-CM | POA: Diagnosis not present

## 2021-05-03 DIAGNOSIS — Z961 Presence of intraocular lens: Secondary | ICD-10-CM | POA: Diagnosis not present

## 2021-05-03 DIAGNOSIS — H35033 Hypertensive retinopathy, bilateral: Secondary | ICD-10-CM | POA: Diagnosis not present

## 2021-05-04 DIAGNOSIS — M25561 Pain in right knee: Secondary | ICD-10-CM | POA: Diagnosis not present

## 2021-05-04 DIAGNOSIS — M6281 Muscle weakness (generalized): Secondary | ICD-10-CM | POA: Diagnosis not present

## 2021-05-04 DIAGNOSIS — M25562 Pain in left knee: Secondary | ICD-10-CM | POA: Diagnosis not present

## 2021-05-04 DIAGNOSIS — R2689 Other abnormalities of gait and mobility: Secondary | ICD-10-CM | POA: Diagnosis not present

## 2021-05-05 ENCOUNTER — Other Ambulatory Visit: Payer: Self-pay | Admitting: Family Medicine

## 2021-05-07 DIAGNOSIS — R2689 Other abnormalities of gait and mobility: Secondary | ICD-10-CM | POA: Diagnosis not present

## 2021-05-07 DIAGNOSIS — M25561 Pain in right knee: Secondary | ICD-10-CM | POA: Diagnosis not present

## 2021-05-07 DIAGNOSIS — M6281 Muscle weakness (generalized): Secondary | ICD-10-CM | POA: Diagnosis not present

## 2021-05-07 DIAGNOSIS — M25562 Pain in left knee: Secondary | ICD-10-CM | POA: Diagnosis not present

## 2021-05-08 ENCOUNTER — Ambulatory Visit: Payer: Medicare Other | Admitting: Family Medicine

## 2021-05-09 DIAGNOSIS — M25562 Pain in left knee: Secondary | ICD-10-CM | POA: Diagnosis not present

## 2021-05-09 DIAGNOSIS — M6281 Muscle weakness (generalized): Secondary | ICD-10-CM | POA: Diagnosis not present

## 2021-05-09 DIAGNOSIS — R2689 Other abnormalities of gait and mobility: Secondary | ICD-10-CM | POA: Diagnosis not present

## 2021-05-09 DIAGNOSIS — M25561 Pain in right knee: Secondary | ICD-10-CM | POA: Diagnosis not present

## 2021-05-11 ENCOUNTER — Other Ambulatory Visit: Payer: Self-pay

## 2021-05-11 ENCOUNTER — Ambulatory Visit (INDEPENDENT_AMBULATORY_CARE_PROVIDER_SITE_OTHER): Payer: Medicare Other | Admitting: Family Medicine

## 2021-05-11 ENCOUNTER — Encounter: Payer: Self-pay | Admitting: Family Medicine

## 2021-05-11 VITALS — BP 130/64 | HR 63 | Temp 97.9°F | Ht 67.0 in | Wt 160.4 lb

## 2021-05-11 DIAGNOSIS — K219 Gastro-esophageal reflux disease without esophagitis: Secondary | ICD-10-CM

## 2021-05-11 DIAGNOSIS — I82591 Chronic embolism and thrombosis of other specified deep vein of right lower extremity: Secondary | ICD-10-CM | POA: Diagnosis not present

## 2021-05-11 DIAGNOSIS — N183 Chronic kidney disease, stage 3 unspecified: Secondary | ICD-10-CM | POA: Diagnosis not present

## 2021-05-11 DIAGNOSIS — I480 Paroxysmal atrial fibrillation: Secondary | ICD-10-CM | POA: Diagnosis not present

## 2021-05-11 DIAGNOSIS — I10 Essential (primary) hypertension: Secondary | ICD-10-CM

## 2021-05-11 DIAGNOSIS — E039 Hypothyroidism, unspecified: Secondary | ICD-10-CM

## 2021-05-11 DIAGNOSIS — E785 Hyperlipidemia, unspecified: Secondary | ICD-10-CM

## 2021-05-11 DIAGNOSIS — F325 Major depressive disorder, single episode, in full remission: Secondary | ICD-10-CM

## 2021-05-11 MED ORDER — AMLODIPINE BESYLATE 5 MG PO TABS: 5.0000 mg | ORAL_TABLET | Freq: Every day | ORAL | 3 refills | Status: DC

## 2021-05-11 MED ORDER — MIRABEGRON ER 25 MG PO TB24: 25.0000 mg | ORAL_TABLET | Freq: Every day | ORAL | 5 refills | Status: DC

## 2021-05-11 NOTE — Progress Notes (Signed)
Phone [phone] In person visit   Subjective:   Laura Li is a 81 y.o. year old very pleasant female patient who presents for/with See problem oriented charting Chief Complaint  Patient presents with   Follow-up   Dementia   Hypertension   Hyperlipidemia   Hypothyroidism   Depression   Gastroesophageal Reflux    Pt c/o more heartburn that has been bothering her that comes and goes.    This visit occurred during the SARS-CoV-2 public health emergency.  Safety protocols were in place, including screening questions prior to the visit, additional usage of staff PPE, and extensive cleaning of exam room while observing appropriate contact time as indicated for disinfecting solutions.   Past Medical History-  Patient Active Problem List   Diagnosis Date Noted   MCI (mild cognitive impairment) 10/16/2016    Priority: High   Paroxysmal atrial fibrillation ([place]) 06/04/2016    Priority: High   Chronic deep vein thrombosis (DVT) ([place]) 09/01/2015    Priority: High   History of left breast cancer 04/24/2014    Priority: High   History of right breast cancer 04/24/2014    Priority: High   Major depressive disorder with single episode, in full remission [facility])     Priority: Medium    Overactive bladder 09/19/2016    Priority: Medium    Acute lower UTI 05/29/2016    Priority: Medium    Osteopenia 05/23/2016    Priority: Medium    History of pneumothorax 06/15/2014    Priority: Medium    Chronic kidney disease, stage III (moderate) ([place]) 12/09/2012    Priority: Medium    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    Priority: Medium    History of vasculitis 11/09/2012    Priority: Medium    Hypothyroidism 02/06/2006    Priority: Medium    Hyperlipidemia 02/06/2006    Priority: Medium    Essential hypertension 02/06/2006    Priority: Medium    Localized swelling, mass, or lump of lower extremity 11/05/2014    Priority: Low   Diverticulitis 08/09/2014    Priority:  Low   Family history of breast cancer 04/29/2014    Priority: Low   GERD (gastroesophageal reflux disease) 03/31/2014    Priority: Low   Hot flashes 03/31/2014    Priority: Low   Osteoarthritis 03/31/2014    Priority: Low   Allergic rhinitis 02/06/2006    Priority: Low   Disorder of bone and cartilage 05/23/2017    Medications- reviewed and updated Current Outpatient Medications  Medication Sig Dispense Refill   acetaminophen (TYLENOL) 500 MG tablet Take 500 mg by mouth daily as needed for mild pain.     amLODipine (NORVASC) 5 MG tablet Take 1 tablet (5 mg total) by mouth daily. 90 tablet 3   B Complex-C-E-Zn (B COMPLEX-C-E-ZINC) tablet Take 1 tablet by mouth daily.     Calcium-Vitamin D (CALTRATE 600 PLUS-VIT D PO) Take 1 tablet by mouth 2 (two) times daily.     carboxymethylcellulose (REFRESH PLUS) 0.5 % SOLN 2 drops 3 (three) times daily as needed (dry eyes).     famotidine (PEPCID) 20 MG tablet Take 1 tablet (20 mg total) by mouth 2 (two) times daily. 60 tablet 5   Lactobacillus-Inulin (CULTURELLE DIGESTIVE HEALTH PO) Take 1 capsule by mouth daily.     levothyroxine (SYNTHROID) 88 MCG tablet TAKE 1 TABLET(88 MCG) BY MOUTH DAILY 90 tablet 3   mirabegron ER (MYRBETRIQ) 25 MG TB24 tablet Take 1 tablet (25  mg total) by mouth daily. 30 tablet 5   Multiple Vitamins-Minerals (CENTRUM SILVER PO) Take 1 tablet by mouth daily.     nadolol (CORGARD) 20 MG tablet Take 1 tablet (20 mg total) by mouth daily. 90 tablet 3   rosuvastatin (CRESTOR) 10 MG tablet Take 1 tablet (10 mg total) by mouth daily. 90 tablet 3   venlafaxine XR (EFFEXOR-XR) 150 MG 24 hr capsule TAKE 1 CAPSULE(150 MG) BY MOUTH DAILY 90 capsule 3   vitamin B-12 (CYANOCOBALAMIN) 100 MCG tablet Take 100 mcg by mouth daily.     Current Facility-Administered Medications  Medication Dose Route Frequency Provider Last Rate Last Admin   Bevacizumab (AVASTIN) SOLN 1.25 mg  1.25 mg Intravitreal  Bernarda Caffey, MD   1.25 mg at 01/04/18  2328   Bevacizumab (AVASTIN) SOLN 1.25 mg  1.25 mg Intravitreal  Bernarda Caffey, MD   1.25 mg at 01/30/18 1230   Bevacizumab (AVASTIN) SOLN 1.25 mg  1.25 mg Intravitreal  Bernarda Caffey, MD   1.25 mg at 02/27/18 1315   Bevacizumab (AVASTIN) SOLN 1.25 mg  1.25 mg Intravitreal  Bernarda Caffey, MD   1.25 mg at 03/27/18 1138     Objective:  BP 130/64    Pulse 63    Temp 97.9 F (36.6 C)    Ht 5\' 7"  (1.702 m)    Wt 160 lb 6.4 oz (72.8 kg)    SpO2 96%    BMI 25.12 kg/m  Gen: NAD, resting comfortably CV: stable murmur. no rubs or gallops Lungs: CTAB no crackles, wheeze, rhonchi Abdomen: soft/nontender/nondistended/normal bowel sounds. No rebound or guarding.  Ext: trace edema Skin: warm, dry Neuro:  pleasant confusion    Assessment and Plan   # Dementia -followed with Dr. Edwena Felty office in past. aricept felt itchy. namenda trial 2022 but led to agitation -released from neurology now- will have some paperwork needs completed to re substantiate dementia  # History of both right and left breast cancer followed by Dr. Jana Hakim - released from his care. we had opted to discontinue mammograms unless had new symptoms.   # Atrial Fibrillation-paroxysmal #Chronic DVT-DVT around 2000 and chronic DVT was noted November 04, 2014 S: Rate Controlled with Nadolol 20 mg  Anticoagulated with Xarelto Previously- She Stopped Xarelto after Subretinal Hemorrhage and Has Decided so Far Not to Restart as of June 2021  Chadsvasc Score of 6- Stroke risk was 9.7% per year in >90,000 patients (the [place] Atrial Fibrillation Cohort Study) and 13.6% risk of stroke/TIA/systemic embolism. A/P: a fib overall stable with rate control- some increased risk of stroke but wants to avoid rebleed in the eye with restarting xarelto  - the eye issue was rather traumatic for her- we are weighting that in decision -some fall risk so also want to reduce risk of major bleed if hit head  #hyperlipidemia #History of stroke remote on  CT S: Medication:transition to rosuvastatin 10 mg from atorvastatin 20mg  Lab Results  Component Value Date   CHOL 212 (H) 05/05/2020   HDL 40.10 05/05/2020   LDLCALC 124 (H) 06/21/2013   LDLDIRECT 101.0 05/05/2020   TRIG (H) 05/05/2020    483.0 Triglyceride is over 400; calculations on Lipids are invalid.   CHOLHDL 5 05/05/2020   A/P: Poor control but has not want to increase strength of statin-continue current medication despite LDL being over 70-update lipid panel today  #hypothyroidism S: compliant On thyroid medication-Levothyroxine 88MCG  Lab Results  Component Value Date   TSH 2.33 05/05/2020  A/P:  hopefully stable- update TSH today. Continue current meds for now   #hypertension S: medication: Nadolol 20 mg tablet , amlodipine 2.5 mg- no more headaches or nausea since starting Home readings #s: 150/70s at home occasionally higher- #s today about the lowest that she gets BP Readings from Last 3 Encounters:  05/11/21 130/64  04/23/21 (!) 151/81  11/18/20 (!) 178/112  A/P: Well-controlled today-but high at home rather persistently -we opted to increase amlodipine to 5 mg and update me in 2-3 weeks - continue nadolol 20 mg  #Chronic kidney disease stage III S: GFR is typically in the 50s range- at times 60s  -Patient knows to avoid NSAIDs  - tylenol only A/P: Hopefully stable-update CMP today  # Depression S: Medication: Venlafaxine XR 150MG   Depression screen [facility] 2/9 05/11/2021 11/03/2020 05/05/2020  Decreased Interest 0 0 0  Down, Depressed, Hopeless 0 0 0  PHQ - 2 Score 0 0 0  Altered sleeping 0 0 0  Tired, decreased energy 0 0 0  Change in appetite 0 0 0  Feeling bad or failure about yourself  0 0 0  Trouble concentrating 0 0 0  Moving slowly or fidgety/restless 0 0 0  Suicidal thoughts 0 0 0  PHQ-9 Score 0 0 0  Difficult doing work/chores Not difficult at all - Not difficult at all  Some recent data might be hidden  A/P: Full remission-continue current  medication  #overactive bladder S: Medication: Myrbetriq 25 mg increased to 50 mg on August 11, 2019 due to worsened issues.  -caffeine contributes - still in dependz -UA and cultures have been reassuring. Caffeine could had contribute but she does not want to cut down  -Overall stable but still with incontinence- not really clear myrbetriq has helped at 50mg - reduce to 25 mg  and update me in 2-3 weeks- may discontinue if no significant change- blood pressure may go down with reducing this as well  # GERD S:Pepcid 20Mg  twice a day. Occasional breakthrough in the past-recommended trial Tums . Has mentioned more in last week or so.  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 Cruzita Lederer last visit March 2019-recommend follow-up August 11, 2019 but patient has opted out  -Discussed importance of avoiding falls- did have one fall at night- doing PT 2-3 x a week- using walker regularly in daytime  Recommended follow up: Return for next already scheduled visit or sooner if needed. Future Appointments  Date Time Provider [place]  11/06/2021  2:40 PM Marin Olp, MD [facility]    Lab/Order associations:   ICD-10-CM   1. Paroxysmal atrial fibrillation (HCC)  I48.0     2. Essential hypertension  I10     3. Hyperlipidemia, unspecified hyperlipidemia type  E78.5 CBC with Differential/Platelet    Comprehensive metabolic panel    Lipid panel    4. Gastroesophageal reflux disease without esophagitis  K21.9     5. Stage 3 chronic kidney disease, unspecified whether stage 3a or 3b CKD (HCC)  N18.30     6. Chronic deep vein thrombosis (DVT) of other vein of  right lower extremity (HCC)  I82.591  7. Major depressive disorder with single episode, in full remission ([place]) Chronic F32.5     8. Hypothyroidism, unspecified type  E03.9 TSH      Meds ordered this encounter  Medications   amLODipine (NORVASC) 5 MG tablet    Sig: Take 1 tablet (5 mg total) by mouth daily.    Dispense:  90 tablet    Refill:  3   mirabegron ER (MYRBETRIQ) 25 MG TB24 tablet    Sig: Take 1 tablet (25 mg total) by mouth daily.    Dispense:  30 tablet    Refill:  5    I,Jada Bradford,acting as a scribe for [name], MD.,have documented all relevant documentation on the behalf of [name], MD,as directed by  [name], MD while in the presence of [name], MD.  I, [name], MD, have reviewed all documentation for this visit. The documentation on 05/11/21 for the exam, diagnosis, procedures, and orders are all accurate and complete.  Return precautions advised.  [name], MD

## 2021-05-11 NOTE — Patient Instructions (Addendum)
Send [place] date of covid shot- most recent ? ?If you get a cut/scrape need tetanus shot- you can get this updated at a pharmacy usually for more affordable cost ? ?-we opted to increase amlodipine to 5 mg and update me in 2-3 weeks ?- continue nadolol 20 mg ? ?-not really clear myrbetriq has helped at 50mg - reduce to 25 mg  and update me in 2-3 weeks- may discontinue if no significant change- blood pressure may go down with reducing this as well ? ?Mild worsening of reflux but may have been out of pepcid- will restart regularly and trial tums in addition - they will let me know if not improving in next few weeks ? ?Please stop by lab before you go ?If you have mychart- we will send your results within 3 business days of [place] receiving them.  ?If you do not have mychart- we will call you about results within 5 business days of [place] receiving them.  ?*please also note that you will see labs on mychart as soon as they post. I will later go in and write notes on them- will say "notes from Dr. Yong Channel"  ? ?Recommended follow up: Return for next already scheduled visit or sooner if needed. ?

## 2021-05-12 LAB — COMPREHENSIVE METABOLIC PANEL
AG Ratio: 1.6 (calc) (ref 1.0–2.5)
ALT: 16 U/L (ref 6–29)
AST: 19 U/L (ref 10–35)
Albumin: 4.5 g/dL (ref 3.6–5.1)
Alkaline phosphatase (APISO): 77 U/L (ref 37–153)
BUN/Creatinine Ratio: 20 (calc) (ref 6–22)
BUN: 22 mg/dL (ref 7–25)
CO2: 26 mmol/L (ref 20–32)
Calcium: 9.9 mg/dL (ref 8.6–10.4)
Chloride: 103 mmol/L (ref 98–110)
Creat: 1.09 mg/dL — ABNORMAL HIGH (ref 0.60–0.95)
Globulin: 2.9 g/dL (calc) (ref 1.9–3.7)
Glucose, Bld: 124 mg/dL — ABNORMAL HIGH (ref 65–99)
Potassium: 4.7 mmol/L (ref 3.5–5.3)
Sodium: 140 mmol/L (ref 135–146)
Total Bilirubin: 0.4 mg/dL (ref 0.2–1.2)
Total Protein: 7.4 g/dL (ref 6.1–8.1)

## 2021-05-12 LAB — CBC WITH DIFFERENTIAL/PLATELET
Absolute Monocytes: 740 cells/uL (ref 200–950)
Basophils Absolute: 37 cells/uL (ref 0–200)
Basophils Relative: 0.5 %
Eosinophils Absolute: 237 cells/uL (ref 15–500)
Eosinophils Relative: 3.2 %
HCT: 37.7 % (ref 35.0–45.0)
Hemoglobin: 12.7 g/dL (ref 11.7–15.5)
Lymphs Abs: 1946 cells/uL (ref 850–3900)
MCH: 30.8 pg (ref 27.0–33.0)
MCHC: 33.7 g/dL (ref 32.0–36.0)
MCV: 91.5 fL (ref 80.0–100.0)
MPV: 10.5 fL (ref 7.5–12.5)
Monocytes Relative: 10 %
Neutro Abs: 4440 cells/uL (ref 1500–7800)
Neutrophils Relative %: 60 %
Platelets: 205 10*3/uL (ref 140–400)
RBC: 4.12 10*6/uL (ref 3.80–5.10)
RDW: 13 % (ref 11.0–15.0)
Total Lymphocyte: 26.3 %
WBC: 7.4 10*3/uL (ref 3.8–10.8)

## 2021-05-12 LAB — LIPID PANEL
Cholesterol: 189 mg/dL (ref <200)
HDL: 42 mg/dL — ABNORMAL LOW (ref >=50)
Non-HDL Cholesterol (Calc): 147 mg/dL (calc) — ABNORMAL HIGH (ref <130)
Total CHOL/HDL Ratio: 4.5 (calc) (ref <5.0)
Triglycerides: 418 mg/dL — ABNORMAL HIGH (ref <150)

## 2021-05-12 LAB — TSH: TSH: 2.5 mIU/L (ref 0.40–4.50)

## 2021-05-14 DIAGNOSIS — M25561 Pain in right knee: Secondary | ICD-10-CM | POA: Diagnosis not present

## 2021-05-14 DIAGNOSIS — M6281 Muscle weakness (generalized): Secondary | ICD-10-CM | POA: Diagnosis not present

## 2021-05-14 DIAGNOSIS — R2689 Other abnormalities of gait and mobility: Secondary | ICD-10-CM | POA: Diagnosis not present

## 2021-05-14 DIAGNOSIS — M25562 Pain in left knee: Secondary | ICD-10-CM | POA: Diagnosis not present

## 2021-05-16 DIAGNOSIS — R2689 Other abnormalities of gait and mobility: Secondary | ICD-10-CM | POA: Diagnosis not present

## 2021-05-16 DIAGNOSIS — M6281 Muscle weakness (generalized): Secondary | ICD-10-CM | POA: Diagnosis not present

## 2021-05-16 DIAGNOSIS — M25561 Pain in right knee: Secondary | ICD-10-CM | POA: Diagnosis not present

## 2021-05-16 DIAGNOSIS — M25562 Pain in left knee: Secondary | ICD-10-CM | POA: Diagnosis not present

## 2021-05-21 DIAGNOSIS — M25561 Pain in right knee: Secondary | ICD-10-CM | POA: Diagnosis not present

## 2021-05-21 DIAGNOSIS — M25562 Pain in left knee: Secondary | ICD-10-CM | POA: Diagnosis not present

## 2021-05-21 DIAGNOSIS — M6281 Muscle weakness (generalized): Secondary | ICD-10-CM | POA: Diagnosis not present

## 2021-05-21 DIAGNOSIS — R2689 Other abnormalities of gait and mobility: Secondary | ICD-10-CM | POA: Diagnosis not present

## 2021-05-23 DIAGNOSIS — M6281 Muscle weakness (generalized): Secondary | ICD-10-CM | POA: Diagnosis not present

## 2021-05-23 DIAGNOSIS — M25562 Pain in left knee: Secondary | ICD-10-CM | POA: Diagnosis not present

## 2021-05-23 DIAGNOSIS — M25561 Pain in right knee: Secondary | ICD-10-CM | POA: Diagnosis not present

## 2021-05-23 DIAGNOSIS — R2689 Other abnormalities of gait and mobility: Secondary | ICD-10-CM | POA: Diagnosis not present

## 2021-05-28 DIAGNOSIS — M25562 Pain in left knee: Secondary | ICD-10-CM | POA: Diagnosis not present

## 2021-05-28 DIAGNOSIS — R2689 Other abnormalities of gait and mobility: Secondary | ICD-10-CM | POA: Diagnosis not present

## 2021-05-28 DIAGNOSIS — M25561 Pain in right knee: Secondary | ICD-10-CM | POA: Diagnosis not present

## 2021-05-28 DIAGNOSIS — M6281 Muscle weakness (generalized): Secondary | ICD-10-CM | POA: Diagnosis not present

## 2021-05-30 DIAGNOSIS — M25562 Pain in left knee: Secondary | ICD-10-CM | POA: Diagnosis not present

## 2021-05-30 DIAGNOSIS — R2689 Other abnormalities of gait and mobility: Secondary | ICD-10-CM | POA: Diagnosis not present

## 2021-05-30 DIAGNOSIS — M25561 Pain in right knee: Secondary | ICD-10-CM | POA: Diagnosis not present

## 2021-05-30 DIAGNOSIS — M6281 Muscle weakness (generalized): Secondary | ICD-10-CM | POA: Diagnosis not present

## 2021-06-04 DIAGNOSIS — M25562 Pain in left knee: Secondary | ICD-10-CM | POA: Diagnosis not present

## 2021-06-04 DIAGNOSIS — R2689 Other abnormalities of gait and mobility: Secondary | ICD-10-CM | POA: Diagnosis not present

## 2021-06-04 DIAGNOSIS — M6281 Muscle weakness (generalized): Secondary | ICD-10-CM | POA: Diagnosis not present

## 2021-06-04 DIAGNOSIS — M25561 Pain in right knee: Secondary | ICD-10-CM | POA: Diagnosis not present

## 2021-06-25 DIAGNOSIS — Z20822 Contact with and (suspected) exposure to covid-19: Secondary | ICD-10-CM | POA: Diagnosis not present

## 2021-07-28 ENCOUNTER — Other Ambulatory Visit: Payer: Self-pay | Admitting: Family Medicine

## 2021-10-01 DIAGNOSIS — D224 Melanocytic nevi of scalp and neck: Secondary | ICD-10-CM | POA: Diagnosis not present

## 2021-10-01 DIAGNOSIS — L821 Other seborrheic keratosis: Secondary | ICD-10-CM | POA: Diagnosis not present

## 2021-10-01 DIAGNOSIS — Z85820 Personal history of malignant melanoma of skin: Secondary | ICD-10-CM | POA: Diagnosis not present

## 2021-10-01 DIAGNOSIS — L438 Other lichen planus: Secondary | ICD-10-CM | POA: Diagnosis not present

## 2021-10-01 DIAGNOSIS — D225 Melanocytic nevi of trunk: Secondary | ICD-10-CM | POA: Diagnosis not present

## 2021-10-02 ENCOUNTER — Other Ambulatory Visit: Payer: Self-pay | Admitting: Family Medicine

## 2021-11-05 ENCOUNTER — Other Ambulatory Visit: Payer: Self-pay | Admitting: Family Medicine

## 2021-11-06 ENCOUNTER — Ambulatory Visit: Payer: Medicare Other | Admitting: Family Medicine

## 2021-11-07 ENCOUNTER — Encounter: Payer: Self-pay | Admitting: Family Medicine

## 2021-11-07 ENCOUNTER — Ambulatory Visit (INDEPENDENT_AMBULATORY_CARE_PROVIDER_SITE_OTHER): Payer: Medicare Other | Admitting: Family Medicine

## 2021-11-07 VITALS — BP 124/70 | HR 68 | Temp 97.7°F | Ht 67.0 in | Wt 163.0 lb

## 2021-11-07 DIAGNOSIS — M810 Age-related osteoporosis without current pathological fracture: Secondary | ICD-10-CM

## 2021-11-07 DIAGNOSIS — N1832 Chronic kidney disease, stage 3b: Secondary | ICD-10-CM | POA: Diagnosis not present

## 2021-11-07 DIAGNOSIS — R600 Localized edema: Secondary | ICD-10-CM

## 2021-11-07 DIAGNOSIS — E785 Hyperlipidemia, unspecified: Secondary | ICD-10-CM

## 2021-11-07 DIAGNOSIS — E039 Hypothyroidism, unspecified: Secondary | ICD-10-CM

## 2021-11-07 DIAGNOSIS — I82591 Chronic embolism and thrombosis of other specified deep vein of right lower extremity: Secondary | ICD-10-CM

## 2021-11-07 DIAGNOSIS — I480 Paroxysmal atrial fibrillation: Secondary | ICD-10-CM | POA: Diagnosis not present

## 2021-11-07 NOTE — Patient Instructions (Addendum)
High dose full shot in fall  Your daughter rescued you from labs today- will plan on next visit  Hold off on covid shot due to prior reaction  You are eligible to schedule your annual wellness visit with our nurse specialist Otila Kluver.  Please consider scheduling this before you leave today  Schedule your bone density test at check out desk.  - located [address] across the street from [place] - in the basement - you DO NEED an appointment for the bone density tests.   We will call you within two weeks about your referral for scan of legs to rule out worsening clot. If you do not hear within 2 weeks, give [place] a call.   Bring cuff to next visit- pressure looks great in office  Recommended follow up: Return in about 6 months (around 05/09/2022) for followup or sooner if needed.Schedule b4 you leave.

## 2021-11-07 NOTE — Progress Notes (Signed)
Phone [phone] In person visit   Subjective:   Laura Li is a 81 y.o. year old very pleasant female patient who presents for/with See problem oriented charting Chief Complaint  Patient presents with   Atrial Fibrillation   Hypertension   Past Medical History-  Patient Active Problem List   Diagnosis Date Noted   MCI (mild cognitive impairment) 10/16/2016    Priority: High   Paroxysmal atrial fibrillation ([place]) 06/04/2016    Priority: High   Chronic deep vein thrombosis (DVT) ([place]) 09/01/2015    Priority: High   History of left breast cancer 04/24/2014    Priority: High   History of right breast cancer 04/24/2014    Priority: High   Major depressive disorder with single episode, in full remission ([place])     Priority: Medium    Overactive bladder 09/19/2016    Priority: Medium    Acute lower UTI 05/29/2016    Priority: Medium    Osteopenia 05/23/2016    Priority: Medium    History of pneumothorax 06/15/2014    Priority: Medium    Chronic kidney disease, stage III (moderate) ([place]) 12/09/2012    Priority: Medium    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    Priority: Medium    History of vasculitis 11/09/2012    Priority: Medium    Hypothyroidism 02/06/2006    Priority: Medium    Hyperlipidemia 02/06/2006    Priority: Medium    Essential hypertension 02/06/2006    Priority: Medium    Localized swelling, mass, or lump of lower extremity 11/05/2014    Priority: Low   Diverticulitis 08/09/2014    Priority: Low   Family history of breast cancer 04/29/2014    Priority: Low   GERD (gastroesophageal reflux disease) 03/31/2014    Priority: Low   Hot flashes 03/31/2014    Priority: Low   Osteoarthritis 03/31/2014    Priority: Low   Allergic rhinitis 02/06/2006    Priority: Low   Disorder of bone and cartilage 05/23/2017    Medications- reviewed and updated Current Outpatient Medications  Medication Sig Dispense Refill   acetaminophen (TYLENOL) 500  MG tablet Take 500 mg by mouth daily as needed for mild pain.     amLODipine (NORVASC) 5 MG tablet Take 1 tablet (5 mg total) by mouth daily. 90 tablet 3   B Complex-C-E-Zn (B COMPLEX-C-E-ZINC) tablet Take 1 tablet by mouth daily.     Calcium-Vitamin D (CALTRATE 600 PLUS-VIT D PO) Take 1 tablet by mouth 2 (two) times daily.     carboxymethylcellulose (REFRESH PLUS) 0.5 % SOLN 2 drops 3 (three) times daily as needed (dry eyes).     famotidine (PEPCID) 20 MG tablet Take 1 tablet (20 mg total) by mouth 2 (two) times daily. 60 tablet 5   Lactobacillus-Inulin (CULTURELLE DIGESTIVE HEALTH PO) Take 1 capsule by mouth daily.     levothyroxine (SYNTHROID) 88 MCG tablet TAKE 1 TABLET(88 MCG) BY MOUTH DAILY 90 tablet 3   Multiple Vitamins-Minerals (CENTRUM SILVER PO) Take 1 tablet by mouth daily.     MYRBETRIQ 25 MG TB24 tablet TAKE 1 TABLET(25 MG) BY MOUTH DAILY 30 tablet 5   nadolol (CORGARD) 20 MG tablet TAKE 1 TABLET(20 MG) BY MOUTH DAILY 90 tablet 3   rosuvastatin (CRESTOR) 10 MG tablet TAKE 1 TABLET(10 MG) BY MOUTH DAILY 90 tablet 3   venlafaxine XR (EFFEXOR-XR) 150 MG 24 hr capsule TAKE 1 CAPSULE(150 MG) BY MOUTH DAILY 90 capsule 3   vitamin B-12 (CYANOCOBALAMIN)  100 MCG tablet Take 100 mcg by mouth daily.     Current Facility-Administered Medications  Medication Dose Route Frequency Provider Last Rate Last Admin   Bevacizumab (AVASTIN) SOLN 1.25 mg  1.25 mg Intravitreal  Bernarda Caffey, MD   1.25 mg at 01/04/18 2328   Bevacizumab (AVASTIN) SOLN 1.25 mg  1.25 mg Intravitreal  Bernarda Caffey, MD   1.25 mg at 01/30/18 1230   Bevacizumab (AVASTIN) SOLN 1.25 mg  1.25 mg Intravitreal  Bernarda Caffey, MD   1.25 mg at 02/27/18 1315   Bevacizumab (AVASTIN) SOLN 1.25 mg  1.25 mg Intravitreal  Bernarda Caffey, MD   1.25 mg at 03/27/18 1138     Objective:  BP 124/70 (BP Location: Left Arm, Patient Position: Sitting, Cuff Size: Normal)   Pulse 68   Temp 97.7 F (36.5 C) (Temporal)   Ht '5\' 7"'$  (1.702 m)   Wt  163 lb (73.9 kg)   SpO2 97%   BMI 25.53 kg/m  Gen: NAD, resting comfortably CV: RRR no murmurs rubs or gallops Lungs: CTAB no crackles, wheeze, rhonchi Abdomen: soft/nontender/nondistended/normal bowel sounds. No rebound or guarding.  Ext: RLE trace edema- more around ankle, left lower minimalSkin: warm, dry Neuro: walks with cane, looks to daughter for many answers     Assessment and Plan   % Dementia - prior followed with Dr. Edwena Felty office. aricept felt itchy. namenda trial 2022 but led to agitation and discontinued as have been neuro follow ups   # Atrial Fibrillation-paroxysmal #Chronic DVT-DVT around 2000 and chronic DVT was noted November 04, 2014 S: Rate Controlled with Nadolol '20mg'$   Anticoagulated with Xarelto Previously- She Stopped Xarelto after Subretinal Hemorrhage and Has Decided so Far Not to Restart as of June 2021 even with risk of dvt or a fib but is having some RLE edema for a few weeks- did start amlodipine which could be cause Chadsvasc Score of at least 4  A/P: appropriately rate controlled. Not anticoagulated due to combo fall risk plus subretinal hemorrhage plus her preference- if did have DVT would change equation potentially -RLE edema- occasional ankle pain- will update duplex    #hyperlipidemia-considering more intense control if any evidence of stroke on MRI- eventually she/daughter opted out of this  S: Medication:transition to rosuvastatin 10 mg from atorvastatin '20mg'$  due to less potential to cross blood brain barrier Lab Results  Component Value Date   CHOL 189 05/11/2021   HDL 42 (L) 05/11/2021   [place]  05/11/2021     Comment:     . LDL cholesterol not calculated. Triglyceride levels greater than 400 mg/dL invalidate calculated LDL results. . Reference range: <100 . Desirable range <100 mg/dL for primary prevention;   <70 mg/dL for patients with CHD or diabetic patients  with > or = 2 CHD risk factors. [name] LDL-C is now calculated using the  Martin-Hopkins  calculation, which is a validated novel method providing  better accuracy than the Friedewald equation in the  estimation of LDL-C.  Cresenciano Genre et al. Annamaria Helling. 6761;950(93): 2061-2068  (http://education.[employer].com/faq/FAQ164)    LDLDIRECT 101.0 05/05/2020   TRIG 418 (H) 05/11/2021   CHOLHDL 4.5 05/11/2021   A/P: we have opted to continue    #hypothyroidism S: compliant On thyroid medication-Levothyroxine 88MCG  Lab Results  Component Value Date   TSH 2.50 05/11/2021  A/P: has been well controlled- is having slight weight gain but likely from too many milkshakes- is cutting back- will check tsh next visit- wanted to hold off   #  hypertension S: medication: Nadolol 20 mg tablet, amlodipine 2.5 mg (half of 5 mg). Typically 140s or even 150s at home. Similar on repeat in office BP Readings from Last 3 Encounters:  11/07/21 124/70  05/11/21 130/64  04/23/21 (!) 151/81  A/P: blood pressure looks great in office again- home #s somewhat higher- they are going to bring home cuff to next visit- for now continue current meds   #Chronic kidney disease stage III S: GFR is typically in the 50s range- at times 60s  -Patient knows to avoid NSAIDs   A/P: CKD III has been stable- update next visit- wanted to opt out   # Depression S: Medication: Venlafaxine XR '150MG'$      11/07/2021   10:47 AM 05/11/2021    1:38 PM 11/03/2020    1:32 PM  Depression screen PHQ 2/9  Decreased Interest 0 0 0  Down, Depressed, Hopeless 0 0 0  PHQ - 2 Score 0 0 0  Altered sleeping 0 0 0  Tired, decreased energy 3 0 0  Change in appetite 0 0 0  Feeling bad or failure about yourself  0 0 0  Trouble concentrating 0 0 0  Moving slowly or fidgety/restless 0 0 0  Suicidal thoughts 0 0 0  PHQ-9 Score 3 0 0  Difficult doing work/chores Not difficult at all Not difficult at all   A/P: full remisison- continue current meds   #overactive bladder S: Medication: Myrbetriq 25 mg increased to 50 mg on  August 11, 2019 due to worsening issues. wears adult pull ups  -UA and cultures have been reassuring.  Caffeine could contribute but she does not want to cut down  A/P:  OAB stable but imperfect- continue current meds. Has seen urology in past she reports    # GERD S:Pepcid '20Mg'$  twice a day. Occasional breakthrough-recommended trial Tums - does help some  A/P: imperfect control- continue current meds -Prefer to avoid PPI if possible due to kidney and memory risks  % Osteopenia-Dr. Cruzita Lederer last visit March 2019-and actually had osteoporosis.  -fosamax failure in past due to reflux - no falls -did not have follow up with Dr. Cruzita Lederer due to pandemic - mentioned prolia for 3-6 years and reclast 1-2 years on last note.   #Mild weight gain- advised to make this peak weight Wt Readings from Last 3 Encounters:  11/07/21 163 lb (73.9 kg)  05/11/21 160 lb 6.4 oz (72.8 kg)  04/23/21 156 lb 3.2 oz (70.9 kg)   # HM-  -alcohol only rare and social- ideal with memory changes to avoid -holding off on shingrix especially with covid reaction - hold off on covid- bedbound after those -flu vaccine high dose at [place]- will let us know the date - Tdap- agrees to only with cut/scrape - breast cancer history- opted to discontinue screenings/follow up due to dementia/age combination- prior followed by Dr. Jana Hakim - passed age based screening for colonoscopy due to dementia - sees derm still  - boyfriend still but not sexually active   Recommended follow up: Return in about 6 months (around 05/09/2022) for followup or sooner if needed.Schedule b4 you leave.  Lab/Order associations:   ICD-10-CM   1. Edema of right lower extremity  R60.0 VAS [place] LOWER EXTREMITY VENOUS (DVT)    2. Chronic deep vein thrombosis (DVT) of other vein of right lower extremity (HCC)  I82.591     3. Stage 3b chronic kidney disease (HCC)  N18.32     4. Age-related osteoporosis without  current pathological fracture  M81.0 DG Bone  Density    5. Hypothyroidism, unspecified type  E03.9     6. Hyperlipidemia, unspecified hyperlipidemia type  E78.5     7. Paroxysmal atrial fibrillation (HCC)  I48.0       No orders of the defined types were placed in this encounter.   Return precautions advised.  [name], MD

## 2021-11-13 DIAGNOSIS — H353223 Exudative age-related macular degeneration, left eye, with inactive scar: Secondary | ICD-10-CM | POA: Diagnosis not present

## 2021-11-13 DIAGNOSIS — Z961 Presence of intraocular lens: Secondary | ICD-10-CM | POA: Diagnosis not present

## 2021-11-13 DIAGNOSIS — H04123 Dry eye syndrome of bilateral lacrimal glands: Secondary | ICD-10-CM | POA: Diagnosis not present

## 2021-11-13 DIAGNOSIS — H35033 Hypertensive retinopathy, bilateral: Secondary | ICD-10-CM | POA: Diagnosis not present

## 2021-11-22 ENCOUNTER — Ambulatory Visit (HOSPITAL_COMMUNITY)
Admission: RE | Admit: 2021-11-22 | Discharge: 2021-11-22 | Disposition: A | Discharge status: Home / Self Care | Payer: Medicare Other | Source: Ambulatory Visit | Attending: Cardiology | Admitting: Cardiology

## 2021-11-22 DIAGNOSIS — R600 Localized edema: Secondary | ICD-10-CM | POA: Diagnosis not present

## 2021-11-26 ENCOUNTER — Other Ambulatory Visit: Payer: Medicare Other

## 2021-11-27 ENCOUNTER — Encounter: Payer: Self-pay | Admitting: Family Medicine

## 2021-11-28 ENCOUNTER — Encounter: Payer: Self-pay | Admitting: Family

## 2021-11-28 ENCOUNTER — Telehealth (INDEPENDENT_AMBULATORY_CARE_PROVIDER_SITE_OTHER): Payer: Medicare Other | Admitting: Family

## 2021-11-28 VITALS — Ht 67.0 in | Wt 163.0 lb

## 2021-11-28 DIAGNOSIS — U071 COVID-19: Secondary | ICD-10-CM

## 2021-11-28 NOTE — Progress Notes (Signed)
MyChart Video Visit    Virtual Visit via Video Note   This format is felt to be most appropriate for this patient at this time. Physical exam was limited by quality of the video and audio technology used for the visit. CMA was able to get the patient set up on a video visit.  Patient location: Home. Patient and provider in visit Provider location: Office  I discussed the limitations of evaluation and management by telemedicine and the availability of in person appointments. The patient expressed understanding and agreed to proceed.  Visit Date: 11/28/2021  Today's healthcare provider: Jeanie Sewer, NP     Subjective:   Patient ID: Laura Li, female    DOB: [birth year], 81 y.o.   MRN: [id number]  Chief Complaint  Patient presents with   Covid Positive     Sx for 3 days    HPI Covid-19 positive:  pt tested positive on 9/18, Symptoms includes fever of 100.0 yesterday, coughing,sore throat, Nasal congestion, headaches. Symptoms started on 9/17. Has tried tylenol, which did help her headache.    Assessment & Plan:   Problem List Items Addressed This Visit   None Visit Diagnoses     COVID-19    -  Primary pt lying in bed, spoke mostly with son-in-law who states pt reports she is feeling better today, very little cough, no fever noted today, no SOB, and Tylenol has helped the fever and headache. Has not taken any OTC sinus/cold meds. Advised on using generic nasal saline mist 2-3 times per day as a disinfectant, hydrate well, eat mini meals if appetite is down. Encourage movement as able walking a little down the hall and back 2-3d/ week.  Advised to wear a mask for 3 more days. Call back if sx are not improving.       Past Medical History:  Diagnosis Date   Anemia    from medicaions   Arthritis    Chronic kidney disease    some renal impairment from meds   Depression    states no depression   Eczema    Esophageal stricture    GERD (gastroesophageal  reflux disease)    Headache    Heart murmur    states it is benign told 30-40 years ago   History of DVT (deep vein thrombosis)    HX: breast cancer    melanoma   Hyperlipidemia    Hypertension    Hypothyroidism    Melanoma ([place])    OA (osteoarthritis)    Personal history of chemotherapy 2016   Left Breast Cancer   Personal history of radiation therapy 2016   Left Breast Cancer   Pneumothorax on right 06/15/2014   PONV (postoperative nausea and vomiting)    Wears glasses     Past Surgical History:  Procedure Laterality Date   APPENDECTOMY  1967   BREAST EXCISIONAL BIOPSY Left    BREAST LUMPECTOMY Left 2016   BREAST SURGERY  1977   removal of calcified milk gland right breast   CHEST TUBE INSERTION  06/15/2014   CHEST TUBE INSERTION Bilateral 06/15/2014   Procedure: CHEST TUBE INSERTION;  Surgeon: Rolm Bookbinder, MD;  Location: [facility];  Service: General;  Laterality: Bilateral;   COLONOSCOPY     [place]   right breast   MASTECTOMY Right 1998   right-nodes out   MELANOMA EXCISION  2004   right side of face   MOHS SURGERY  2005   right left-basal  cell   PORT-A-CATH REMOVAL N/A 10/05/2014   Procedure: REMOVAL PORT-A-CATH;  Surgeon: Rolm Bookbinder, MD;  Location: [facility];  Service: General;  Laterality: N/A;   PORTACATH PLACEMENT Left 06/15/2014   Procedure: INSERTION PORT-A-CATH;  Surgeon: Rolm Bookbinder, MD;  Location: [facility];  Service: General;  Laterality: Left;   RADIOACTIVE SEED GUIDED PARTIAL MASTECTOMY WITH AXILLARY SENTINEL LYMPH NODE BIOPSY Left 05/05/2014   Procedure: RADIOACTIVE SEED GUIDED LEFT LUMPECTOMY WITH AXILLARY SENTINEL LYMPH NODE BIOPSY;  Surgeon: Rolm Bookbinder, MD;  Location: [facility];  Service: General;  Laterality: Left;   [place]   including ovaries    Outpatient Medications Prior to Visit  Medication Sig Dispense Refill    acetaminophen (TYLENOL) 500 MG tablet Take 500 mg by mouth daily as needed for mild pain.     amLODipine (NORVASC) 5 MG tablet Take 1 tablet (5 mg total) by mouth daily. 90 tablet 3   B Complex-C-E-Zn (B COMPLEX-C-E-ZINC) tablet Take 1 tablet by mouth daily.     Calcium-Vitamin D (CALTRATE 600 PLUS-VIT D PO) Take 1 tablet by mouth 2 (two) times daily.     carboxymethylcellulose (REFRESH PLUS) 0.5 % SOLN 2 drops 3 (three) times daily as needed (dry eyes).     famotidine (PEPCID) 20 MG tablet Take 1 tablet (20 mg total) by mouth 2 (two) times daily. 60 tablet 5   Lactobacillus-Inulin (CULTURELLE DIGESTIVE HEALTH PO) Take 1 capsule by mouth daily.     levothyroxine (SYNTHROID) 88 MCG tablet TAKE 1 TABLET(88 MCG) BY MOUTH DAILY 90 tablet 3   Multiple Vitamins-Minerals (CENTRUM SILVER PO) Take 1 tablet by mouth daily.     MYRBETRIQ 25 MG TB24 tablet TAKE 1 TABLET(25 MG) BY MOUTH DAILY 30 tablet 5   nadolol (CORGARD) 20 MG tablet TAKE 1 TABLET(20 MG) BY MOUTH DAILY 90 tablet 3   rosuvastatin (CRESTOR) 10 MG tablet TAKE 1 TABLET(10 MG) BY MOUTH DAILY 90 tablet 3   venlafaxine XR (EFFEXOR-XR) 150 MG 24 hr capsule TAKE 1 CAPSULE(150 MG) BY MOUTH DAILY 90 capsule 3   vitamin B-12 (CYANOCOBALAMIN) 100 MCG tablet Take 100 mcg by mouth daily.     erythromycin ophthalmic ointment SMARTSIG:1 sparingly In Eye(s) Every Night (Patient not taking: Reported on 11/28/2021)     Facility-Administered Medications Prior to Visit  Medication Dose Route Frequency Provider Last Rate Last Admin   Bevacizumab (AVASTIN) SOLN 1.25 mg  1.25 mg Intravitreal  Bernarda Caffey, MD   1.25 mg at 01/04/18 2328   Bevacizumab (AVASTIN) SOLN 1.25 mg  1.25 mg Intravitreal  Bernarda Caffey, MD   1.25 mg at 01/30/18 1230   Bevacizumab (AVASTIN) SOLN 1.25 mg  1.25 mg Intravitreal  Bernarda Caffey, MD   1.25 mg at 02/27/18 1315   Bevacizumab (AVASTIN) SOLN 1.25 mg  1.25 mg Intravitreal  Bernarda Caffey, MD   1.25 mg at 03/27/18 1138    Allergies   Allergen Reactions   Aricept [Donepezil Hcl] Itching   Dyazide [Hydrochlorothiazide W-Triamterene]     Presumption: drug induced vasculitis   Namenda [Memantine]    Niacin Other (See Comments)    flushing   Penicillins Diarrhea and Nausea And Vomiting       Objective:   Physical Exam Vitals and nursing note reviewed.  Constitutional:      General: She is not in acute distress.    Appearance: Normal appearance.  HENT:     Head:  Normocephalic.  Pulmonary:     Effort: No respiratory distress.  Musculoskeletal:     Cervical back: Normal range of motion.  Skin:    General: Skin is dry.     Coloration: Skin is not pale.  Neurological:     Mental Status: She is alert and oriented to person, place, and time.  Psychiatric:        Mood and Affect: Mood normal.   Ht '5\' 7"'$  (1.702 m)   Wt 163 lb (73.9 kg)   BMI 25.53 kg/m   Wt Readings from Last 3 Encounters:  11/28/21 163 lb (73.9 kg)  11/07/21 163 lb (73.9 kg)  05/11/21 160 lb 6.4 oz (72.8 kg)      I discussed the assessment and treatment plan with the patient. The patient was provided an opportunity to ask questions and all were answered. The patient agreed with the plan and demonstrated an understanding of the instructions.   The patient was advised to call back or seek an in-person evaluation if the symptoms worsen or if the condition fails to improve as anticipated.  Jeanie Sewer, NP [place] [phone] (phone) [phone] (fax)  [place]

## 2021-11-29 ENCOUNTER — Inpatient Hospital Stay: Admission: RE | Admit: 2021-11-29 | Payer: Medicare Other | Source: Ambulatory Visit

## 2021-12-18 ENCOUNTER — Ambulatory Visit (INDEPENDENT_AMBULATORY_CARE_PROVIDER_SITE_OTHER)
Admission: RE | Admit: 2021-12-18 | Discharge: 2021-12-18 | Disposition: A | Discharge status: Home / Self Care | Payer: Medicare Other | Source: Ambulatory Visit | Attending: Family Medicine | Admitting: Family Medicine

## 2021-12-18 DIAGNOSIS — M810 Age-related osteoporosis without current pathological fracture: Secondary | ICD-10-CM | POA: Diagnosis not present

## 2021-12-26 ENCOUNTER — Encounter: Payer: Self-pay | Admitting: Family Medicine

## 2021-12-28 ENCOUNTER — Encounter: Payer: Self-pay | Admitting: Family Medicine

## 2021-12-28 ENCOUNTER — Ambulatory Visit (INDEPENDENT_AMBULATORY_CARE_PROVIDER_SITE_OTHER): Payer: Medicare Other | Admitting: Family Medicine

## 2021-12-28 VITALS — BP 128/78 | HR 62 | Temp 97.0°F | Ht 67.0 in | Wt 162.0 lb

## 2021-12-28 DIAGNOSIS — J329 Chronic sinusitis, unspecified: Secondary | ICD-10-CM | POA: Diagnosis not present

## 2021-12-28 DIAGNOSIS — I10 Essential (primary) hypertension: Secondary | ICD-10-CM

## 2021-12-28 DIAGNOSIS — B9689 Other specified bacterial agents as the cause of diseases classified elsewhere: Secondary | ICD-10-CM

## 2021-12-28 MED ORDER — DOXYCYCLINE HYCLATE 100 MG PO TABS
100.0000 mg | ORAL_TABLET | Freq: Two times a day (BID) | ORAL | 0 refills | Status: AC
Start: 2021-12-28 — End: 2022-01-07

## 2021-12-28 NOTE — Patient Instructions (Addendum)
5 weeks of nasal congestion and pressure starting after COVID-19-suspect secondary bacterial sinusitis-recommend doxycycline twice daily for 10 days and return to see [place] if fails to improve or if symptoms worsen - Has some mild irritation of eyelids-can use a very light amount of Vaseline and I also wonder if may have some benefit from the doxycycline-avoid as best as possible scratching/rubbing the eyelids  (do not get in the eye)  Recommended follow up: Return for as needed for new, worsening, persistent symptoms.

## 2021-12-28 NOTE — Progress Notes (Signed)
Phone [phone] In person visit   Subjective:   Laura Li is a 81 y.o. year old very pleasant female patient who presents for/with See problem oriented charting Chief Complaint  Patient presents with   Cough    Pt c/o a cough and nasal congestion. Covid 5 weeks ago. Has tried tylenol and delsym which has helped.    Past Medical History-  Patient Active Problem List   Diagnosis Date Noted   MCI (mild cognitive impairment) 10/16/2016    Priority: High   Paroxysmal atrial fibrillation ([place]) 06/04/2016    Priority: High   Chronic deep vein thrombosis (DVT) ([place]) 09/01/2015    Priority: High   History of left breast cancer 04/24/2014    Priority: High   History of right breast cancer 04/24/2014    Priority: High   Major depressive disorder with single episode, in full remission [facility])     Priority: Medium    Overactive bladder 09/19/2016    Priority: Medium    Acute lower UTI 05/29/2016    Priority: Medium    Osteopenia 05/23/2016    Priority: Medium    History of pneumothorax 06/15/2014    Priority: Medium    Chronic kidney disease, stage III (moderate) ([place]) 12/09/2012    Priority: Medium    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    Priority: Medium    History of vasculitis 11/09/2012    Priority: Medium    Hypothyroidism 02/06/2006    Priority: Medium    Hyperlipidemia 02/06/2006    Priority: Medium    Essential hypertension 02/06/2006    Priority: Medium    Localized swelling, mass, or lump of lower extremity 11/05/2014    Priority: Low   Diverticulitis 08/09/2014    Priority: Low   Family history of breast cancer 04/29/2014    Priority: Low   GERD (gastroesophageal reflux disease) 03/31/2014    Priority: Low   Hot flashes 03/31/2014    Priority: Low   Osteoarthritis 03/31/2014    Priority: Low   Allergic rhinitis 02/06/2006    Priority: Low   Disorder of bone and cartilage 05/23/2017    Medications- reviewed and updated Current Outpatient  Medications  Medication Sig Dispense Refill   acetaminophen (TYLENOL) 500 MG tablet Take 500 mg by mouth daily as needed for mild pain.     amLODipine (NORVASC) 5 MG tablet Take 1 tablet (5 mg total) by mouth daily. 90 tablet 3   B Complex-C-E-Zn (B COMPLEX-C-E-ZINC) tablet Take 1 tablet by mouth daily.     Calcium-Vitamin D (CALTRATE 600 PLUS-VIT D PO) Take 1 tablet by mouth 2 (two) times daily.     carboxymethylcellulose (REFRESH PLUS) 0.5 % SOLN 2 drops 3 (three) times daily as needed (dry eyes).     doxycycline (VIBRA-TABS) 100 MG tablet Take 1 tablet (100 mg total) by mouth 2 (two) times daily for 10 days. 20 tablet 0   famotidine (PEPCID) 20 MG tablet Take 1 tablet (20 mg total) by mouth 2 (two) times daily. 60 tablet 5   Lactobacillus-Inulin (CULTURELLE DIGESTIVE HEALTH PO) Take 1 capsule by mouth daily.     levothyroxine (SYNTHROID) 88 MCG tablet TAKE 1 TABLET(88 MCG) BY MOUTH DAILY 90 tablet 3   Multiple Vitamins-Minerals (CENTRUM SILVER PO) Take 1 tablet by mouth daily.     MYRBETRIQ 25 MG TB24 tablet TAKE 1 TABLET(25 MG) BY MOUTH DAILY 30 tablet 5   nadolol (CORGARD) 20 MG tablet TAKE 1 TABLET(20 MG) BY MOUTH DAILY 90  tablet 3   rosuvastatin (CRESTOR) 10 MG tablet TAKE 1 TABLET(10 MG) BY MOUTH DAILY 90 tablet 3   venlafaxine XR (EFFEXOR-XR) 150 MG 24 hr capsule TAKE 1 CAPSULE(150 MG) BY MOUTH DAILY 90 capsule 3   vitamin B-12 (CYANOCOBALAMIN) 100 MCG tablet Take 100 mcg by mouth daily.     Current Facility-Administered Medications  Medication Dose Route Frequency Provider Last Rate Last Admin   Bevacizumab (AVASTIN) SOLN 1.25 mg  1.25 mg Intravitreal  Bernarda Caffey, MD   1.25 mg at 01/04/18 2328   Bevacizumab (AVASTIN) SOLN 1.25 mg  1.25 mg Intravitreal  Bernarda Caffey, MD   1.25 mg at 01/30/18 1230   Bevacizumab (AVASTIN) SOLN 1.25 mg  1.25 mg Intravitreal  Bernarda Caffey, MD   1.25 mg at 02/27/18 1315   Bevacizumab (AVASTIN) SOLN 1.25 mg  1.25 mg Intravitreal  Bernarda Caffey, MD    1.25 mg at 03/27/18 1138     Objective:  BP 128/78   Pulse 62   Temp (!) 97 F (36.1 C) (Temporal)   Ht '5\' 7"'$  (1.702 m)   Wt 162 lb (73.5 kg)   SpO2 97%   BMI 25.37 kg/m  Gen: NAD, resting comfortably Nasal turbinates erythematous and edematous with yellow discharge noted in the left nostril.  Oropharynx largely normal.  No maxillary sinus tenderness-does have frontal sinus tenderness CV: RRR no murmurs rubs or gallops Lungs: CTAB no crackles, wheeze, rhonchi Ext: no edema Skin: warm, dry    Assessment and Plan   # nasal congestion and pressure S: Patient had COVID about 5 weeks ago.  She recently has been dealing with cough and nasal congestion starting around that time.  Tylenol and Delsym have helped some. More fatigued in last few days- wants to sleep more.  - no fever in last 2 weeks. Cough has improved but sinus issus worsening. No shortness of breath.  A/P: 5 weeks of nasal congestion and pressure starting after COVID-19-suspect secondary bacterial sinusitis-recommend doxycycline for 10 days and return to see [place] if fails to improve or if symptoms worsen - Has some mild irritation of eyelids-can use a very light amount of Vaseline and I also wonder if may have some benefit from the doxycycline-avoid as best as possible scratching/rubbing the eyelids  (do not get in the eye) -Knows to avoid decongestants  #hypertension S: medication: Nadolol 20 mg tablet, amlodipine 2.5 mg (half of 5 mg on 11/07/21 visit but may increase) Home readings: yesterday 117/83  BP Readings from Last 3 Encounters:  12/28/21 128/78  11/07/21 124/70  05/11/21 130/64  A/P: Controlled on repeat-continue current medication  Recommended follow up: Return for as needed for new, worsening, persistent symptoms. Future Appointments  Date Time Provider [place]  05/09/2022  1:20 PM Marin Olp, MD [facility]   Lab/Order associations:   ICD-10-CM   1. Bacterial sinusitis  J32.9     B96.89     2. Essential hypertension  I10      Meds ordered this encounter  Medications   doxycycline (VIBRA-TABS) 100 MG tablet    Sig: Take 1 tablet (100 mg total) by mouth 2 (two) times daily for 10 days.    Dispense:  20 tablet    Refill:  0    Return precautions advised.  [name], MD

## 2022-01-23 ENCOUNTER — Other Ambulatory Visit: Payer: Self-pay | Admitting: Family Medicine

## 2022-02-10 ENCOUNTER — Encounter: Payer: Self-pay | Admitting: Family Medicine

## 2022-02-27 ENCOUNTER — Telehealth: Payer: Self-pay | Admitting: Family Medicine

## 2022-02-27 NOTE — Telephone Encounter (Signed)
Copied from [place] [phone]. Topic: Medicare AWV >> Feb 27, 2022 12:01 PM Gillis Santa wrote: Reason for CRM: [id number] KIM TO CALL [place]

## 2022-04-10 ENCOUNTER — Other Ambulatory Visit: Payer: Self-pay | Admitting: Family Medicine

## 2022-04-15 ENCOUNTER — Ambulatory Visit (INDEPENDENT_AMBULATORY_CARE_PROVIDER_SITE_OTHER): Payer: Medicare Other | Admitting: Family

## 2022-04-15 ENCOUNTER — Encounter: Payer: Self-pay | Admitting: Family

## 2022-04-15 VITALS — BP 155/81 | HR 73 | Temp 97.0°F | Wt 160.0 lb

## 2022-04-15 DIAGNOSIS — R053 Chronic cough: Secondary | ICD-10-CM

## 2022-04-15 MED ORDER — AZITHROMYCIN 250 MG PO TABS: ORAL_TABLET | ORAL | 0 refills | Status: AC

## 2022-04-15 MED ORDER — PREDNISONE 20 MG PO TABS: ORAL_TABLET | ORAL | 0 refills | Status: DC

## 2022-04-15 NOTE — Progress Notes (Signed)
Patient ID: Laura Li, female    DOB: May 07, 1940, 82 y.o.   MRN: [id number]  Chief Complaint  Patient presents with   Cough    Pt c/o a Deep cough for 6 days, slight fever of 99.8 on Saturday. Bilateral off and on ear pain. Has tried delsym and tylenol which does help.    *Patient is accompanied by their daughter today, who is helping to provide HPI/medical information.  HPI:     Persistent cough:   started a week ago, mild fever over weekend, deep cough, wheezing on exhaling, using delsym syrup. pt has weakness, dtr present and states she isn't able to cough strongly, concerned secretions are sitting at the base of her lungs.      Assessment & Plan:  1. Persistent cough - lungs with rhonchi, wheezing, pt coughing during exam. sending pred pack and zpack, advised pt & dtr on use & SE of both. Can try OTC generic mucinex, drink plenty of fluids, use humidifier overnight. Call back if sx have not improved by next week.  - predniSONE (DELTASONE) 20 MG tablet; Take 2 pills in the morning with breakfast for 3 days, then 1 pill for 2 days  Dispense: 8 tablet; Refill: 0 - azithromycin (ZITHROMAX) 250 MG tablet; Take 2 tablets on day 1, then 1 tablet daily on days 2 through 5  Dispense: 6 tablet; Refill: 0    Subjective:    Outpatient Medications Prior to Visit  Medication Sig Dispense Refill   acetaminophen (TYLENOL) 500 MG tablet Take 500 mg by mouth daily as needed for mild pain.     amLODipine (NORVASC) 5 MG tablet TAKE 1 TABLET(5 MG) BY MOUTH DAILY 90 tablet 3   B Complex-C-E-Zn (B COMPLEX-C-E-ZINC) tablet Take 1 tablet by mouth daily.     Calcium-Vitamin D (CALTRATE 600 PLUS-VIT D PO) Take 1 tablet by mouth 2 (two) times daily.     carboxymethylcellulose (REFRESH PLUS) 0.5 % SOLN 2 drops 3 (three) times daily as needed (dry eyes).     famotidine (PEPCID) 20 MG tablet Take 1 tablet (20 mg total) by mouth 2 (two) times daily. 60 tablet 5   Lactobacillus-Inulin (CULTURELLE  DIGESTIVE HEALTH PO) Take 1 capsule by mouth daily.     levothyroxine (SYNTHROID) 88 MCG tablet TAKE 1 TABLET(88 MCG) BY MOUTH DAILY 90 tablet 3   Multiple Vitamins-Minerals (CENTRUM SILVER PO) Take 1 tablet by mouth daily.     MYRBETRIQ 25 MG TB24 tablet TAKE 1 TABLET(25 MG) BY MOUTH DAILY 30 tablet 5   nadolol (CORGARD) 20 MG tablet TAKE 1 TABLET(20 MG) BY MOUTH DAILY 90 tablet 3   rosuvastatin (CRESTOR) 10 MG tablet TAKE 1 TABLET(10 MG) BY MOUTH DAILY 90 tablet 3   venlafaxine XR (EFFEXOR-XR) 150 MG 24 hr capsule TAKE 1 CAPSULE(150 MG) BY MOUTH DAILY 90 capsule 3   vitamin B-12 (CYANOCOBALAMIN) 100 MCG tablet Take 100 mcg by mouth daily.     Facility-Administered Medications Prior to Visit  Medication Dose Route Frequency Provider Last Rate Last Admin   Bevacizumab (AVASTIN) SOLN 1.25 mg  1.25 mg Intravitreal  Bernarda Caffey, MD   1.25 mg at 01/04/18 2328   Bevacizumab (AVASTIN) SOLN 1.25 mg  1.25 mg Intravitreal  Bernarda Caffey, MD   1.25 mg at 01/30/18 1230   Bevacizumab (AVASTIN) SOLN 1.25 mg  1.25 mg Intravitreal  Bernarda Caffey, MD   1.25 mg at 02/27/18 1315   Bevacizumab (AVASTIN) SOLN 1.25 mg  1.25 mg Intravitreal  Bernarda Caffey, MD   1.25 mg at 03/27/18 1138   Past Medical History:  Diagnosis Date   Anemia    from medicaions   Arthritis    Chronic kidney disease    some renal impairment from meds   Depression    states no depression   Eczema    Esophageal stricture    GERD (gastroesophageal reflux disease)    Headache    Heart murmur    states it is benign told 30-40 years ago   History of DVT (deep vein thrombosis)    HX: breast cancer    melanoma   Hyperlipidemia    Hypertension    Hypothyroidism    Melanoma ([facility])    OA (osteoarthritis)    Personal history of chemotherapy 2016   Left Breast Cancer   Personal history of radiation therapy 2016   Left Breast Cancer   Pneumothorax on right 06/15/2014   PONV (postoperative nausea and vomiting)    Wears glasses     Past Surgical History:  Procedure Laterality Date   APPENDECTOMY  1967   BREAST EXCISIONAL BIOPSY Left    BREAST LUMPECTOMY Left 2016   BREAST SURGERY  1977   removal of calcified milk gland right breast   CHEST TUBE INSERTION  06/15/2014   CHEST TUBE INSERTION Bilateral 06/15/2014   Procedure: CHEST TUBE INSERTION;  Surgeon: Rolm Bookbinder, MD;  Location: [place] [facility];  Service: General;  Laterality: Bilateral;   COLONOSCOPY     [place]   right breast   MASTECTOMY Right 1998   right-nodes out   MELANOMA EXCISION  2004   right side of face   MOHS SURGERY  2005   right left-basal cell   PORT-A-CATH REMOVAL N/A 10/05/2014   Procedure: REMOVAL PORT-A-CATH;  Surgeon: Rolm Bookbinder, MD;  Location: [facility];  Service: General;  Laterality: N/A;   PORTACATH PLACEMENT Left 06/15/2014   Procedure: INSERTION PORT-A-CATH;  Surgeon: Rolm Bookbinder, MD;  Location: [facility];  Service: General;  Laterality: Left;   RADIOACTIVE SEED GUIDED PARTIAL MASTECTOMY WITH AXILLARY SENTINEL LYMPH NODE BIOPSY Left 05/05/2014   Procedure: RADIOACTIVE SEED GUIDED LEFT LUMPECTOMY WITH AXILLARY SENTINEL LYMPH NODE BIOPSY;  Surgeon: Rolm Bookbinder, MD;  Location: [place];  Service: General;  Laterality: Left;   TONSILLECTOMY AND ADENOIDECTOMY  1949   TOTAL ABDOMINAL HYSTERECTOMY  1992   including ovaries   Allergies  Allergen Reactions   Aricept [Donepezil Hcl] Itching   Dyazide [Hydrochlorothiazide W-Triamterene]     Presumption: drug induced vasculitis   Namenda [Memantine]    Niacin Other (See Comments)    flushing   Penicillins Diarrhea and Nausea And Vomiting    From younger age/youth      Objective:    Physical Exam Vitals and nursing note reviewed.  Constitutional:      Appearance: Normal appearance.  HENT:     Right Ear: Tympanic membrane and ear canal normal.     Left Ear: Tympanic membrane and ear canal normal.     Mouth/Throat:      Mouth: Mucous membranes are moist.     Pharynx: No pharyngeal swelling, oropharyngeal exudate, posterior oropharyngeal erythema or uvula swelling.  Cardiovascular:     Rate and Rhythm: Normal rate and regular rhythm.  Pulmonary:     Effort: Pulmonary effort is normal.     Breath sounds: Examination of the right-upper field reveals rhonchi. Examination of the left-upper field reveals rhonchi. Examination of the right-middle  field reveals rhonchi. Examination of the left-middle field reveals rhonchi. Rhonchi present.  Musculoskeletal:        General: Normal range of motion.  Lymphadenopathy:     Head:     Right side of head: No preauricular or posterior auricular adenopathy.     Left side of head: No preauricular or posterior auricular adenopathy.     Cervical: No cervical adenopathy.     Right cervical: No superficial cervical adenopathy.    Left cervical: No superficial cervical adenopathy.  Skin:    General: Skin is warm and dry.  Neurological:     Mental Status: She is alert.  Psychiatric:        Mood and Affect: Mood normal.        Behavior: Behavior normal.    BP (!) 155/81 (BP Location: Left Arm, Patient Position: Sitting, Cuff Size: Large)   Pulse 73   Temp (!) 97 F (36.1 C) (Temporal)   Wt 160 lb (72.6 kg)   SpO2 92%   BMI 25.06 kg/m  Wt Readings from Last 3 Encounters:  04/15/22 160 lb (72.6 kg)  12/28/21 162 lb (73.5 kg)  11/28/21 163 lb (73.9 kg)       Jeanie Sewer, NP

## 2022-04-26 ENCOUNTER — Other Ambulatory Visit: Payer: Self-pay | Admitting: Family Medicine

## 2022-05-09 ENCOUNTER — Encounter: Payer: Self-pay | Admitting: Family Medicine

## 2022-05-09 ENCOUNTER — Ambulatory Visit (INDEPENDENT_AMBULATORY_CARE_PROVIDER_SITE_OTHER): Payer: Medicare Other | Admitting: Family Medicine

## 2022-05-09 VITALS — BP 120/70 | HR 67 | Temp 97.7°F | Ht 67.0 in | Wt 160.4 lb

## 2022-05-09 DIAGNOSIS — I10 Essential (primary) hypertension: Secondary | ICD-10-CM

## 2022-05-09 DIAGNOSIS — B9689 Other specified bacterial agents as the cause of diseases classified elsewhere: Secondary | ICD-10-CM | POA: Diagnosis not present

## 2022-05-09 DIAGNOSIS — J329 Chronic sinusitis, unspecified: Secondary | ICD-10-CM

## 2022-05-09 DIAGNOSIS — E785 Hyperlipidemia, unspecified: Secondary | ICD-10-CM

## 2022-05-09 DIAGNOSIS — I480 Paroxysmal atrial fibrillation: Secondary | ICD-10-CM | POA: Diagnosis not present

## 2022-05-09 DIAGNOSIS — E039 Hypothyroidism, unspecified: Secondary | ICD-10-CM | POA: Diagnosis not present

## 2022-05-09 DIAGNOSIS — N1832 Chronic kidney disease, stage 3b: Secondary | ICD-10-CM

## 2022-05-09 DIAGNOSIS — F0280 Dementia in other diseases classified elsewhere without behavioral disturbance: Secondary | ICD-10-CM

## 2022-05-09 DIAGNOSIS — G301 Alzheimer's disease with late onset: Secondary | ICD-10-CM

## 2022-05-09 LAB — COMPREHENSIVE METABOLIC PANEL
ALT: 12 U/L (ref 0–35)
AST: 17 U/L (ref 0–37)
Albumin: 4.3 g/dL (ref 3.5–5.2)
Alkaline Phosphatase: 86 U/L (ref 39–117)
BUN: 21 mg/dL (ref 6–23)
CO2: 30 mEq/L (ref 19–32)
Calcium: 10.3 mg/dL (ref 8.4–10.5)
Chloride: 101 mEq/L (ref 96–112)
Creatinine, Ser: 1.27 mg/dL — ABNORMAL HIGH (ref 0.40–1.20)
GFR: 39.11 mL/min — ABNORMAL LOW (ref >60.00)
Glucose, Bld: 111 mg/dL — ABNORMAL HIGH (ref 70–99)
Potassium: 3.9 mEq/L (ref 3.5–5.1)
Sodium: 140 mEq/L (ref 135–145)
Total Bilirubin: 0.4 mg/dL (ref 0.2–1.2)
Total Protein: 7.7 g/dL (ref 6.0–8.3)

## 2022-05-09 LAB — LIPID PANEL
Cholesterol: 179 mg/dL (ref 0–200)
HDL: 38.5 mg/dL — ABNORMAL LOW (ref >39.00)
Total CHOL/HDL Ratio: 5
Triglycerides: 447 mg/dL — ABNORMAL HIGH (ref 0.0–149.0)

## 2022-05-09 LAB — CBC WITH DIFFERENTIAL/PLATELET
Basophils Absolute: 0.1 10*3/uL (ref 0.0–0.1)
Basophils Relative: 0.7 % (ref 0.0–3.0)
Eosinophils Absolute: 0.4 10*3/uL (ref 0.0–0.7)
Eosinophils Relative: 5.2 % — ABNORMAL HIGH (ref 0.0–5.0)
HCT: 38.8 % (ref 36.0–46.0)
Hemoglobin: 13.4 g/dL (ref 12.0–15.0)
Lymphocytes Relative: 30.1 % (ref 12.0–46.0)
Lymphs Abs: 2.1 10*3/uL (ref 0.7–4.0)
MCHC: 34.6 g/dL (ref 30.0–36.0)
MCV: 88.8 fl (ref 78.0–100.0)
Monocytes Absolute: 0.5 10*3/uL (ref 0.1–1.0)
Monocytes Relative: 6.8 % (ref 3.0–12.0)
Neutro Abs: 4 10*3/uL (ref 1.4–7.7)
Neutrophils Relative %: 57.2 % (ref 43.0–77.0)
Platelets: 230 10*3/uL (ref 150.0–400.0)
RBC: 4.37 Mil/uL (ref 3.87–5.11)
RDW: 13.8 % (ref 11.5–15.5)
WBC: 7 10*3/uL (ref 4.0–10.5)

## 2022-05-09 LAB — LDL CHOLESTEROL, DIRECT: Direct LDL: 82 mg/dL

## 2022-05-09 LAB — TSH: TSH: 4.07 u[IU]/mL (ref 0.35–5.50)

## 2022-05-09 MED ORDER — VENLAFAXINE HCL ER 150 MG PO CP24: ORAL_CAPSULE | ORAL | 3 refills | Status: DC

## 2022-05-09 MED ORDER — DOXYCYCLINE HYCLATE 100 MG PO TABS: 100.0000 mg | ORAL_TABLET | Freq: Two times a day (BID) | ORAL | 0 refills | Status: AC

## 2022-05-09 NOTE — Patient Instructions (Addendum)
Please stop by lab before you go If you have mychart- we will send your results within 3 business days of [place] receiving them.  If you do not have mychart- we will call you about results within 5 business days of [place] receiving them.  *please also note that you will see labs on mychart as soon as they post. I will later go in and write notes on them- will say "notes from Dr. Yong Channel"   -on exam- a lot of drainage noted, right frontal sinus tenderness noted- with persistence we opted to treat with doxycycline for 7 days- return if not improving or worsens again  Recommended follow up: Return in about 6 months (around 11/07/2022) for followup or sooner if needed.Schedule b4 you leave.

## 2022-05-09 NOTE — Progress Notes (Signed)
Phone [phone] In person visit   Subjective:   Laura Li is a 82 y.o. year old very pleasant female patient who presents for/with See problem oriented charting Chief Complaint  Patient presents with   Follow-up   Hypertension   Gastroesophageal Reflux    Past Medical History-  Patient Active Problem List   Diagnosis Date Noted   Dementia (Laura Li) 10/16/2016    Priority: High   Paroxysmal atrial fibrillation ([name]) 06/04/2016    Priority: High   Chronic deep vein thrombosis (DVT) ([name]) 09/01/2015    Priority: High   History of left breast cancer 04/24/2014    Priority: High   History of right breast cancer 04/24/2014    Priority: High   Major depressive disorder with single episode, in full remission ([name])     Priority: Medium    Overactive bladder 09/19/2016    Priority: Medium    Acute lower UTI 05/29/2016    Priority: Medium    Osteopenia 05/23/2016    Priority: Medium    History of pneumothorax 06/15/2014    Priority: Medium    Chronic kidney disease, stage III (moderate) ([name]) 12/09/2012    Priority: Medium    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    Priority: Medium    History of vasculitis 11/09/2012    Priority: Medium    Hypothyroidism 02/06/2006    Priority: Medium    Hyperlipidemia 02/06/2006    Priority: Medium    Essential hypertension 02/06/2006    Priority: Medium    Localized swelling, mass, or lump of lower extremity 11/05/2014    Priority: Low   Diverticulitis 08/09/2014    Priority: Low   Family history of breast cancer 04/29/2014    Priority: Low   GERD (gastroesophageal reflux disease) 03/31/2014    Priority: Low   Hot flashes 03/31/2014    Priority: Low   Osteoarthritis 03/31/2014    Priority: Low   Allergic rhinitis 02/06/2006    Priority: Low   Disorder of bone and cartilage 05/23/2017    Medications- reviewed and updated Current Outpatient Medications  Medication Sig Dispense Refill   acetaminophen (TYLENOL) 500  MG tablet Take 500 mg by mouth daily as needed for mild pain.     amLODipine (NORVASC) 5 MG tablet TAKE 1 TABLET(5 MG) BY MOUTH DAILY 90 tablet 3   B Complex-C-E-Zn (B COMPLEX-C-E-ZINC) tablet Take 1 tablet by mouth daily.     Calcium-Vitamin D (CALTRATE 600 PLUS-VIT D PO) Take 1 tablet by mouth 2 (two) times daily.     carboxymethylcellulose (REFRESH PLUS) 0.5 % SOLN 2 drops 3 (three) times daily as needed (dry eyes).     famotidine (PEPCID) 20 MG tablet Take 1 tablet (20 mg total) by mouth 2 (two) times daily. 60 tablet 5   Lactobacillus-Inulin (CULTURELLE DIGESTIVE HEALTH PO) Take 1 capsule by mouth daily.     levothyroxine (SYNTHROID) 88 MCG tablet TAKE 1 TABLET(88 MCG) BY MOUTH DAILY 90 tablet 3   Multiple Vitamins-Minerals (CENTRUM SILVER PO) Take 1 tablet by mouth daily.     MYRBETRIQ 25 MG TB24 tablet TAKE 1 TABLET(25 MG) BY MOUTH DAILY 30 tablet 5   nadolol (CORGARD) 20 MG tablet TAKE 1 TABLET(20 MG) BY MOUTH DAILY 90 tablet 3   predniSONE (DELTASONE) 20 MG tablet Take 2 pills in the morning with breakfast for 3 days, then 1 pill for 2 days 8 tablet 0   rosuvastatin (CRESTOR) 10 MG tablet TAKE 1 TABLET(10 MG) BY MOUTH DAILY 90  tablet 3   vitamin B-12 (CYANOCOBALAMIN) 100 MCG tablet Take 100 mcg by mouth daily.     venlafaxine XR (EFFEXOR-XR) 150 MG 24 hr capsule TAKE 1 CAPSULE(150 MG) BY MOUTH DAILY 90 capsule 3   No current facility-administered medications for this visit.     Objective:  BP 120/70   Pulse 67   Temp 97.7 F (36.5 C)   Ht '5\' 7"'$  (1.702 m)   Wt 160 lb 6.4 oz (72.8 kg)   SpO2 95%   BMI 25.12 kg/m  Gen: NAD, resting comfortably Right maxillary sinus tenderness, nasal turbinates erythematous with clear drainage noted, pharynx with mild erythema and yellow drainage  CV: RRR no murmurs rubs or gallops Lungs: CTAB no crackles, wheeze, rhonchi Abdomen: soft/nontender/nondistended/normal bowel sounds. No rebound or guarding.  Ext: no edema Skin: warm, dry Neuro:  grossly normal, moves all extremities     Assessment and Plan    # Cough- was seen in early February with respiratory illness/congestion issues- was treated with azithromycin and prednisone- improved/nearly resolved and did well fora bout a week and then symptoms worsened- now beginning to improve again  -on exam- a lot of drainage noted, right frontal sinus tenderness noted- with persistence we opted to treat with doxycycline for 7 days for bacterial sinusitis- return if not improving or worsens again  % Dementia -prior followed Dr. Edwena Felty office. aricept felt itchy. 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 No anticoagulation with prior subretinal hemorrhage- also thankfully DVT not worsening as in no increased edema nad stable on last scan- monitor  #hyperlipidemia S: Medication:transition to rosuvastatin 10 mg from atorvastatin '20mg'$  due to less potential to cross blood brain barrier A/P: hopefully stable or improved- update lipid panel today. Continue current meds for now   #hypothyroidism S: compliant On thyroid medication-Levothyroxine 88MCG but not ever taking on empty stomach  Lab Results  Component Value Date   TSH 2.50 05/11/2021   A/P: hopefully stable since not taking on empty stomach- update TSH today. Continue current meds for now   #hypertension S: medication: Nadolol 20 mg tablet, amlodipine 5 mg BP Readings from Last 3 Encounters:  05/09/22 120/70  04/15/22 (!) 155/81  12/28/21 128/78  A/P: stable- continue  current medicines    #Chronic kidney disease stage III S: GFR is typically in the 50s range- at times 60s  -Patient knows to avoid NSAIDs   A/P: hopefully stable- update cmp today.   # Depression S: Medication: Venlafaxine XR '150MG'$      05/09/2022    1:19 PM 04/15/2022    9:48 AM 11/07/2021   10:47 AM  Depression screen PHQ 2/9  Decreased Interest 0 0 0  Down, Depressed, Hopeless 0 0 0  PHQ - 2 Score 0 0 0  Altered sleeping 0 0 0  Tired, decreased energy 0 0 3  Change in appetite 0 0 0  Feeling bad or failure about yourself  0 0 0  Trouble concentrating 0 0 0  Moving slowly or fidgety/restless 0 0 0  Suicidal thoughts 0 0 0  PHQ-9 Score 0  0 3  Difficult doing work/chores Not difficult at all Not difficult at all Not difficult at all  A/P: full remission- continue current medications    #overactive bladder S: Medication: Myrbetriq 25 mg increased to 50 mg on August 11, 2019 due to worsening issues. wears adult pull ups - but [place] A/P:  stable- continue current medicines - still uses adult pull ups     # GERD S:Pepcid '20Mg'$  twice a day. Occasional breakthrough-tums helps  -Prefer to avoid PPI if possible due to kidney and memory risks A/P: stable- continue current medicines     % Osteopenia-Dr. Cruzita Lederer last visit March 2019-recommend follow-up August 11, 2019-prior Fosamax failure and not currently on medication.  Prior notes mention Prolia for 3 to 6 years and Reclast for 1 to 2 years but does not appear she received these. -On repeat DEXA 12/21/2021-Bone density has improved on all measures.  Hip and total fracture risk is high but with improvement (which is the primary point of medicine) I think it would be reasonable to stay off medication for now- recheck in 2-3 years so 12/2023 or later  Recommended follow up: Return in about 6 months (around 11/07/2022) for followup or sooner if needed.Schedule b4 you leave.  Lab/Order associations:   ICD-10-CM   1. Late onset Alzheimer's  dementia without behavioral disturbance, psychotic disturbance, mood disturbance, or anxiety, unspecified dementia severity (HCC)  G30.1    F02.80     2. Paroxysmal atrial fibrillation (HCC)  I48.0     3. Stage 3b chronic kidney disease (HCC)  N18.32     4. Hyperlipidemia, unspecified hyperlipidemia type  E78.5 CBC with Differential/Platelet    Comprehensive metabolic panel    Lipid panel    5. Essential hypertension  I10     6. Hypothyroidism, unspecified type  E03.9 TSH      Meds ordered this encounter  Medications   venlafaxine XR (EFFEXOR-XR) 150 MG 24 hr capsule    Sig: TAKE 1 CAPSULE(150 MG) BY MOUTH DAILY    Dispense:  90 capsule    Refill:  3    Return precautions advised.  [name], MD

## 2022-05-21 DIAGNOSIS — H353223 Exudative age-related macular degeneration, left eye, with inactive scar: Secondary | ICD-10-CM | POA: Diagnosis not present

## 2022-05-21 DIAGNOSIS — H52222 Regular astigmatism, left eye: Secondary | ICD-10-CM | POA: Diagnosis not present

## 2022-05-21 DIAGNOSIS — H524 Presbyopia: Secondary | ICD-10-CM | POA: Diagnosis not present

## 2022-05-21 DIAGNOSIS — H35033 Hypertensive retinopathy, bilateral: Secondary | ICD-10-CM | POA: Diagnosis not present

## 2022-05-21 DIAGNOSIS — Z961 Presence of intraocular lens: Secondary | ICD-10-CM | POA: Diagnosis not present

## 2022-05-21 DIAGNOSIS — H16223 Keratoconjunctivitis sicca, not specified as Sjogren's, bilateral: Secondary | ICD-10-CM | POA: Diagnosis not present

## 2022-10-24 ENCOUNTER — Encounter (INDEPENDENT_AMBULATORY_CARE_PROVIDER_SITE_OTHER): Payer: Self-pay

## 2022-10-29 ENCOUNTER — Other Ambulatory Visit: Payer: Self-pay | Admitting: Family Medicine

## 2022-11-07 ENCOUNTER — Other Ambulatory Visit: Payer: Self-pay

## 2022-11-07 ENCOUNTER — Encounter: Payer: Self-pay | Admitting: Family Medicine

## 2022-11-07 MED ORDER — MIRABEGRON ER 25 MG PO TB24: 25.0000 mg | ORAL_TABLET | Freq: Every day | ORAL | 5 refills | Status: DC

## 2022-11-07 MED ORDER — MIRABEGRON ER 25 MG PO TB24: 25.0000 mg | ORAL_TABLET | Freq: Every day | ORAL | 3 refills | Status: DC

## 2022-11-08 ENCOUNTER — Other Ambulatory Visit: Payer: Self-pay

## 2022-11-08 ENCOUNTER — Encounter: Payer: Self-pay | Admitting: Family Medicine

## 2022-11-08 MED ORDER — LEVOTHYROXINE SODIUM 88 MCG PO TABS: ORAL_TABLET | ORAL | 3 refills | Status: DC

## 2022-11-14 ENCOUNTER — Encounter: Payer: Self-pay | Admitting: Family Medicine

## 2022-11-14 ENCOUNTER — Ambulatory Visit (INDEPENDENT_AMBULATORY_CARE_PROVIDER_SITE_OTHER): Payer: Medicare Other | Admitting: Family Medicine

## 2022-11-14 VITALS — BP 130/60 | HR 76 | Temp 98.2°F | Ht 67.0 in | Wt 162.2 lb

## 2022-11-14 DIAGNOSIS — F325 Major depressive disorder, single episode, in full remission: Secondary | ICD-10-CM

## 2022-11-14 DIAGNOSIS — I82591 Chronic embolism and thrombosis of other specified deep vein of right lower extremity: Secondary | ICD-10-CM

## 2022-11-14 DIAGNOSIS — E785 Hyperlipidemia, unspecified: Secondary | ICD-10-CM | POA: Diagnosis not present

## 2022-11-14 DIAGNOSIS — E039 Hypothyroidism, unspecified: Secondary | ICD-10-CM | POA: Diagnosis not present

## 2022-11-14 DIAGNOSIS — I10 Essential (primary) hypertension: Secondary | ICD-10-CM

## 2022-11-14 NOTE — Addendum Note (Signed)
Addended by: Lorn Junes on: 11/14/2022 02:53 PM   Modules accepted: Orders

## 2022-11-14 NOTE — Patient Instructions (Addendum)
Let us know when you get your flu or COVID vaccine at your pharmacy. Consider RSV vaccine as well  Please stop by lab before you go If you have mychart- we will send your results within 3 business days of [place] receiving them.  If you do not have mychart- we will call you about results within 5 business days of [place] receiving them.  *please also note that you will see labs on mychart as soon as they post. I will later go in and write notes on them- will say "notes from Dr. Durene Cal"   You are eligible to schedule your annual wellness visit with our nurse specialist Inetta Fermo.  Please consider scheduling this before you leave today  Recommended follow up: Return in about 6 months (around 05/14/2023) for followup or sooner if needed.Schedule b4 you leave.

## 2022-11-14 NOTE — Progress Notes (Signed)
Phone [phone] In person visit   Subjective:   Laura Li is a 82 y.o. year old very pleasant female patient who presents for/with See problem oriented charting Chief Complaint  Patient presents with   Medical Management of Chronic Issues   Gastroesophageal Reflux   Hypertension    Past Medical History-  Patient Active Problem List   Diagnosis Date Noted   Dementia (HCC) 10/16/2016    Priority: High   Paroxysmal atrial fibrillation ([facility]) 06/04/2016    Priority: High   Chronic deep vein thrombosis (DVT) ([facility]) 09/01/2015    Priority: High   History of left breast cancer 04/24/2014    Priority: High   History of right breast cancer 04/24/2014    Priority: High   Major depressive disorder with single episode, in full remission (H[facility])     Priority: Medium    Overactive bladder 09/19/2016    Priority: Medium    Acute lower UTI 05/29/2016    Priority: Medium    Osteopenia 05/23/2016    Priority: Medium    History of pneumothorax 06/15/2014    Priority: Medium    Chronic kidney disease, stage III (moderate) (H[facility]) 12/09/2012    Priority: Medium    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    Priority: Medium    History of vasculitis 11/09/2012    Priority: Medium    Hypothyroidism 02/06/2006    Priority: Medium    Hyperlipidemia 02/06/2006    Priority: Medium    Essential hypertension 02/06/2006    Priority: Medium    Localized swelling, mass, or lump of lower extremity 11/05/2014    Priority: Low   Diverticulitis 08/09/2014    Priority: Low   Family history of breast cancer 04/29/2014    Priority: Low   GERD (gastroesophageal reflux disease) 03/31/2014    Priority: Low   Hot flashes 03/31/2014    Priority: Low   Osteoarthritis 03/31/2014    Priority: Low   Allergic rhinitis 02/06/2006    Priority: Low   Disorder of bone and cartilage 05/23/2017    Medications- reviewed and updated Current Outpatient Medications  Medication Sig Dispense Refill    acetaminophen (TYLENOL) 500 MG tablet Take 500 mg by mouth daily as needed for mild pain.     amLODipine (NORVASC) 5 MG tablet TAKE 1 TABLET(5 MG) BY MOUTH DAILY 90 tablet 3   B Complex-C-E-Zn (B COMPLEX-C-E-ZINC) tablet Take 1 tablet by mouth daily.     Calcium-Vitamin D (CALTRATE 600 PLUS-VIT D PO) Take 1 tablet by mouth 2 (two) times daily.     carboxymethylcellulose (REFRESH PLUS) 0.5 % SOLN 2 drops 3 (three) times daily as needed (dry eyes).     famotidine (PEPCID) 20 MG tablet Take 1 tablet (20 mg total) by mouth 2 (two) times daily. 60 tablet 5   Lactobacillus-Inulin (CULTURELLE DIGESTIVE HEALTH PO) Take 1 capsule by mouth daily.     levothyroxine (SYNTHROID) 88 MCG tablet TAKE 1 TABLET(88 MCG) BY MOUTH DAILY 30 tablet 3   mirabegron ER (MYRBETRIQ) 25 MG TB24 tablet Take 1 tablet (25 mg total) by mouth daily. 30 tablet 5   Multiple Vitamins-Minerals (CENTRUM SILVER PO) Take 1 tablet by mouth daily.     nadolol (CORGARD) 20 MG tablet TAKE 1 TABLET(20 MG) BY MOUTH DAILY 90 tablet 3   rosuvastatin (CRESTOR) 10 MG tablet TAKE 1 TABLET(10 MG) BY MOUTH DAILY 90 tablet 3   venlafaxine XR (EFFEXOR-XR) 150 MG 24 hr capsule TAKE 1 CAPSULE(150 MG) BY MOUTH DAILY  90 capsule 3   vitamin B-12 (CYANOCOBALAMIN) 100 MCG tablet Take 100 mcg by mouth daily.     No current facility-administered medications for this visit.     Objective:  BP 130/60   Pulse 76   Temp 98.2 F (36.8 C)   Ht 5\' 7"  (1.702 m)   Wt 162 lb 3.2 oz (73.6 kg)   SpO2 96%   BMI 25.40 kg/m  Gen: NAD, resting comfortably CV: RRR no murmurs rubs or gallops Lungs: CTAB no crackles, wheeze, rhonchi Abdomen: soft/nontender/nondistended/normal bowel sounds. No rebound or guarding.  Ext: no edema, still behind left malleolus mild swelling and tenderness in chronic DVT location Skin: warm, dry     Assessment and Plan   #Health maintenance- flu shot planned in October at [facility] - consider COVID shot at September clinic at  [place]  % Dementia -PCP only.  prior followed Dr. Oliva Bustard office. aricept felt itchy. namenda trial 2022 but led to agitation and discontinued . Off all medications and monitoring. Ongoing gradual reduction in memory. Family has felt largely stable in last 2 years.   % History of both right and left breast cancer followed by Dr. Darnelle Catalan - released from his care. we have opted to discontinue mammograms unless has new symptoms.   # Atrial Fibrillation-paroxysmal #Chronic DVT-DVT around 2000 and chronic DVT was noted November 04, 2014 S: Rate Controlled with Nadolol 20mg   Anticoagulated with Xarelto Previously- She Stopped Xarelto after Subretinal Hemorrhage and Has Decided so Far Not to Restart since 2021   A/P: Appropriately rate controlled.  Off of anticoagulation due to prior subretinal hemorrhage and prefers to remain off even with increase stroke risk. -reviewed stroke risk with chadsvasc and she wants to stay off medicine- still rather trauamtic for her with partial loss of vision- they may also discuss with optho soon for their opinion -Chronic DVT without worsening leg edema despite being off of anticoagulation  #hyperlipidemia-considering more intense control if any evidence of stroke on MRI- eventually she/daughter opted out of this  S: Medication:transition to rosuvastatin 10 mg from atorvastatin 20mg  due to less potential to cross blood brain barrier  Lab Results  Component Value Date   CHOL 179 05/09/2022   HDL 38.50 (L) 05/09/2022   LDLCALC  05/11/2021   LDLDIRECT 82.0 05/09/2022   TRIG (H) 05/09/2022    447.0 Triglyceride is over 400; calculations on Lipids are invalid.   CHOLHDL 5 05/09/2022    A/P: lipids with reasonable control for LDL but triglyceride(s) high- she had opted out of triglyceride(s) study in past and wants to hold off- vascepa or fish oil can increase bleeding risk and wants to hold off   #hypothyroidism S: compliant On thyroid medication-Levothyroxine   Lab Results  Component Value Date   TSH 4.07 05/09/2022   A/P: hopefully stable- update tsh today. Continue current meds for now    #hypertension S: medication: Nadolol 20 mg tablet, amlodipine 5 mg    BP Readings from Last 3 Encounters:  11/14/22 130/60  05/09/22 120/70  04/15/22 (!) 155/81   A/P: stable- continue current medicines    #Chronic kidney disease stage III S: GFR is typically in the 40's-50s range -Patient knows to avoid NSAIDs   A/P: hopefully stable- update cmp today. Continue without meds for now    # Depression S: Medication: Venlafaxine XR 150MG     11/14/2022    1:34 PM 11/14/2022    1:33 PM 05/09/2022    1:19 PM  Depression screen  PHQ 2/9  Decreased Interest 0 0 0  Down, Depressed, Hopeless 0 0 0  PHQ - 2 Score 0 0 0  Altered sleeping 0 0 0  Tired, decreased energy 0 0 0  Change in appetite 0 0 0  Feeling bad or failure about yourself  0 0 0  Trouble concentrating 0 0 0  Moving slowly or fidgety/restless 0 0 0  Suicidal thoughts 0 0 0  PHQ-9 Score 0 0 0  Difficult doing work/chores Not difficult at all  Not difficult at all  A/P: full remission- continue current medications    #overactive bladder S: Medication: Myrbetriq 25 mg increased to 50 mg on August 11, 2019 due to worsening issues. wears adult pull ups  A/P:  reasonable control- continue current medications     # GERD S:Pepcid 20Mg  twice a day. Occasional breakthrough-recommended trial Tums   -Prefer to avoid PPI if possible due to kidney and memory risks A/P: doing well- continue current medications    % Osteopenia - recheck in 2-3 years so 12/2023 or later  #cough- from February visit resolved- started with new cough with allergies- lungs clear   Recommended follow up: Return in about 6 months (around 05/14/2023) for followup or sooner if needed.Schedule b4 you leave.  Lab/Order associations:   ICD-10-CM   1. Essential hypertension  I10     2. Hyperlipidemia, unspecified  hyperlipidemia type  E78.5     3. Hypothyroidism, unspecified type  E03.9     4. Major depressive disorder with single episode, in full remission (HCC) Chronic F32.5     5. Chronic deep vein thrombosis (DVT) of other vein of right lower extremity (HCC) Chronic I82.591       No orders of the defined types were placed in this encounter.   Return precautions advised.  Tana Conch, MD

## 2022-11-15 LAB — COMPREHENSIVE METABOLIC PANEL
AG Ratio: 1.3 (calc) (ref 1.0–2.5)
ALT: 14 U/L (ref 6–29)
AST: 17 U/L (ref 10–35)
Albumin: 4.4 g/dL (ref 3.6–5.1)
Alkaline phosphatase (APISO): 94 U/L (ref 37–153)
BUN/Creatinine Ratio: 20 (calc) (ref 6–22)
BUN: 23 mg/dL (ref 7–25)
CO2: 25 mmol/L (ref 20–32)
Calcium: 9.9 mg/dL (ref 8.6–10.4)
Chloride: 102 mmol/L (ref 98–110)
Creat: 1.13 mg/dL — ABNORMAL HIGH (ref 0.60–0.95)
Globulin: 3.4 g/dL (ref 1.9–3.7)
Glucose, Bld: 104 mg/dL — ABNORMAL HIGH (ref 65–99)
Potassium: 4.2 mmol/L (ref 3.5–5.3)
Sodium: 139 mmol/L (ref 135–146)
Total Bilirubin: 0.3 mg/dL (ref 0.2–1.2)
Total Protein: 7.8 g/dL (ref 6.1–8.1)

## 2022-11-15 LAB — TSH: TSH: 1.42 m[IU]/L (ref 0.40–4.50)

## 2022-12-11 ENCOUNTER — Ambulatory Visit: Payer: Medicare Other

## 2022-12-24 ENCOUNTER — Ambulatory Visit: Payer: Medicare Other

## 2022-12-24 DIAGNOSIS — Z23 Encounter for immunization: Secondary | ICD-10-CM

## 2022-12-24 DIAGNOSIS — L565 Disseminated superficial actinic porokeratosis (DSAP): Secondary | ICD-10-CM | POA: Diagnosis not present

## 2022-12-24 DIAGNOSIS — L821 Other seborrheic keratosis: Secondary | ICD-10-CM | POA: Diagnosis not present

## 2022-12-24 DIAGNOSIS — Z85820 Personal history of malignant melanoma of skin: Secondary | ICD-10-CM | POA: Diagnosis not present

## 2022-12-24 NOTE — Progress Notes (Deleted)
Patient is in office today for a nurse visit for Flu Immunization, per PCP's order. Patient Injection was given in the  Left deltoid. Patient tolerated injection well.

## 2023-01-08 DIAGNOSIS — H16223 Keratoconjunctivitis sicca, not specified as Sjogren's, bilateral: Secondary | ICD-10-CM | POA: Diagnosis not present

## 2023-01-08 DIAGNOSIS — H43813 Vitreous degeneration, bilateral: Secondary | ICD-10-CM | POA: Diagnosis not present

## 2023-01-08 DIAGNOSIS — H35033 Hypertensive retinopathy, bilateral: Secondary | ICD-10-CM | POA: Diagnosis not present

## 2023-01-08 DIAGNOSIS — H353223 Exudative age-related macular degeneration, left eye, with inactive scar: Secondary | ICD-10-CM | POA: Diagnosis not present

## 2023-01-27 ENCOUNTER — Other Ambulatory Visit: Payer: Self-pay | Admitting: Family Medicine

## 2023-04-01 ENCOUNTER — Other Ambulatory Visit: Payer: Self-pay | Admitting: Family Medicine

## 2023-04-26 ENCOUNTER — Other Ambulatory Visit: Payer: Self-pay | Admitting: Family Medicine

## 2023-04-27 ENCOUNTER — Encounter: Payer: Self-pay | Admitting: Family Medicine

## 2023-04-28 ENCOUNTER — Other Ambulatory Visit: Payer: Self-pay

## 2023-04-28 MED ORDER — LEVOTHYROXINE SODIUM 88 MCG PO TABS: ORAL_TABLET | ORAL | 3 refills | Status: DC

## 2023-04-28 MED ORDER — MIRABEGRON ER 25 MG PO TB24: 25.0000 mg | ORAL_TABLET | Freq: Every day | ORAL | 5 refills | Status: DC

## 2023-04-28 MED ORDER — ROSUVASTATIN CALCIUM 10 MG PO TABS
10.0000 mg | ORAL_TABLET | Freq: Every day | ORAL | 3 refills | Status: DC
Start: 2023-04-28 — End: 2023-05-15

## 2023-04-29 ENCOUNTER — Other Ambulatory Visit: Payer: Self-pay | Admitting: Family Medicine

## 2023-05-15 ENCOUNTER — Ambulatory Visit: Payer: Medicare Other | Admitting: Family Medicine

## 2023-05-15 ENCOUNTER — Encounter: Payer: Self-pay | Admitting: Family Medicine

## 2023-05-15 VITALS — BP 138/80 | Temp 97.1°F | Ht 67.0 in | Wt 157.4 lb

## 2023-05-15 DIAGNOSIS — E039 Hypothyroidism, unspecified: Secondary | ICD-10-CM

## 2023-05-15 DIAGNOSIS — N1832 Chronic kidney disease, stage 3b: Secondary | ICD-10-CM

## 2023-05-15 DIAGNOSIS — G301 Alzheimer's disease with late onset: Secondary | ICD-10-CM

## 2023-05-15 DIAGNOSIS — I82591 Chronic embolism and thrombosis of other specified deep vein of right lower extremity: Secondary | ICD-10-CM | POA: Diagnosis not present

## 2023-05-15 DIAGNOSIS — F325 Major depressive disorder, single episode, in full remission: Secondary | ICD-10-CM

## 2023-05-15 DIAGNOSIS — F0280 Dementia in other diseases classified elsewhere without behavioral disturbance: Secondary | ICD-10-CM

## 2023-05-15 DIAGNOSIS — I480 Paroxysmal atrial fibrillation: Secondary | ICD-10-CM

## 2023-05-15 DIAGNOSIS — I10 Essential (primary) hypertension: Secondary | ICD-10-CM | POA: Diagnosis not present

## 2023-05-15 DIAGNOSIS — E785 Hyperlipidemia, unspecified: Secondary | ICD-10-CM

## 2023-05-15 NOTE — Progress Notes (Signed)
 Phone [phone] In person visit   Subjective:   Laura Li is a 83 y.o. year old very pleasant female patient who presents for/with See problem oriented charting Chief Complaint  Patient presents with   Medical Management of Chronic Issues        Hypertension   Gastroesophageal Reflux   Past Medical History-  Patient Active Problem List   Diagnosis Date Noted   Dementia (HCC) 10/16/2016    Priority: High   Paroxysmal atrial fibrillation (HCC) 06/04/2016    Priority: High   Chronic deep vein thrombosis (DVT) (HCC) 09/01/2015    Priority: High   History of left breast cancer 04/24/2014    Priority: High   History of right breast cancer 04/24/2014    Priority: High   Major depressive disorder with single episode, in full remission (HCC)     Priority: Medium    Overactive bladder 09/19/2016    Priority: Medium    Acute lower UTI 05/29/2016    Priority: Medium    Osteopenia 05/23/2016    Priority: Medium    History of pneumothorax 06/15/2014    Priority: Medium    Chronic kidney disease, stage III (moderate) (HCC) 12/09/2012    Priority: Medium    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    Priority: Medium    History of vasculitis 11/09/2012    Priority: Medium    Hypothyroidism 02/06/2006    Priority: Medium    Hyperlipidemia 02/06/2006    Priority: Medium    Essential hypertension 02/06/2006    Priority: Medium    Localized swelling, mass, or lump of lower extremity 11/05/2014    Priority: Low   Diverticulitis 08/09/2014    Priority: Low   Family history of breast cancer 04/29/2014    Priority: Low   GERD (gastroesophageal reflux disease) 03/31/2014    Priority: Low   Hot flashes 03/31/2014    Priority: Low   Osteoarthritis 03/31/2014    Priority: Low   Allergic rhinitis 02/06/2006    Priority: Low   Stage 3b chronic kidney disease (HCC) 05/15/2023   Disorder of bone and cartilage 05/23/2017    Medications- reviewed and updated Current  Outpatient Medications  Medication Sig Dispense Refill   acetaminophen (TYLENOL) 500 MG tablet Take 500 mg by mouth daily as needed for mild pain.     amLODipine (NORVASC) 5 MG tablet TAKE 1 TABLET(5 MG) BY MOUTH DAILY 90 tablet 3   B Complex-C-E-Zn (B COMPLEX-C-E-ZINC) tablet Take 1 tablet by mouth daily.     Calcium-Vitamin D (CALTRATE 600 PLUS-VIT D PO) Take 1 tablet by mouth 2 (two) times daily.     carboxymethylcellulose (REFRESH PLUS) 0.5 % SOLN 2 drops 3 (three) times daily as needed (dry eyes).     famotidine (PEPCID) 20 MG tablet Take 1 tablet (20 mg total) by mouth 2 (two) times daily. 60 tablet 5   Lactobacillus-Inulin (CULTURELLE DIGESTIVE HEALTH PO) Take 1 capsule by mouth daily.     levothyroxine (SYNTHROID) 88 MCG tablet TAKE 1 TABLET(88 MCG) BY MOUTH DAILY 30 tablet 3   Multiple Vitamins-Minerals (CENTRUM SILVER PO) Take 1 tablet by mouth daily.     MYRBETRIQ 25 MG TB24 tablet TAKE 1 TABLET(25 MG) BY MOUTH DAILY 30 tablet 5   nadolol (CORGARD) 20 MG tablet TAKE 1 TABLET(20 MG) BY MOUTH DAILY 90 tablet 3   rosuvastatin (CRESTOR) 10 MG tablet TAKE 1 TABLET(10 MG) BY MOUTH DAILY 90 tablet 3   venlafaxine XR (EFFEXOR-XR) 150 MG 24  hr capsule TAKE 1 CAPSULE(150 MG) BY MOUTH DAILY 90 capsule 3   vitamin B-12 (CYANOCOBALAMIN) 100 MCG tablet Take 100 mcg by mouth daily.     No current facility-administered medications for this visit.     Objective:  BP 138/80   Temp (!) 97.1 F (36.2 C)   Ht 5\' 7"  (1.702 m)   Wt 157 lb 6.4 oz (71.4 kg)   BMI 24.65 kg/m  Gen: NAD, resting comfortably CV: RRR no murmurs rubs or gallops Lungs: CTAB no crackles, wheeze, rhonchi Ext: no edema Skin: warm, dry     Assessment and Plan   % Dementia -PCP only. prior followed Dr. Oliva Bustard office. aricept felt itchy. namenda trial 2022 but led to agitation and discontinued. Daughter states stable to even slightly better as far as less distressed- seems to be in good routine.   % History of both  right and left breast cancer followed by Dr. Darnelle Catalan - released from his care. 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 LDL cholesterol not calculated. Triglyceride levels greater than 400 mg/dL invalidate calculated LDL results. . Reference range: <100 . Desirable range <100 mg/dL for primary prevention;   <70 mg/dL for patients with CHD or diabetic patients  with > or = 2 CHD risk factors. [name] LDL-C is now calculated using the Martin-Hopkins  calculation, which is a validated novel method providing  better accuracy than the Friedewald equation in the  estimation of LDL-C.  Horald Pollen et al. Lenox Ahr. 0981;191(47): 2061-2068  (http://education.[facility].com/faq/FAQ164)    LDLDIRECT 82.0 05/09/2022   TRIG (H) 05/09/2022    447.0 Triglyceride is over 400; calculations on Lipids are invalid.   CHOLHDL 5 05/09/2022  A/P: lipids have been reasonably controlled other than triglyceride(s) - establish with opted to hold off on adding medicine for this specifically but maintain statin   #hypothyroidism S: compliant On thyroid medication-Levothyroxine Lab Results  Component Value  Date   TSH 1.42 11/14/2022    A/P: hopefully stable- update tsh today. Continue current meds for now    #hypertension S: medication: Nadolol 20 mg tablet, amlodipine 5 mg  BP Readings from Last 3 Encounters:  05/15/23 138/80  11/14/22 130/60  05/09/22 120/70  A/P: high acceptable but at her age cautious to add medications- continue current medications    #Chronic kidney disease stage III S: GFR is typically in the 30s-50's range- most recently in 68s -Patient knows to avoid NSAIDs   A/P: hopefully stable- update cmp today. Continue without meds for now    # Depression S: Medication: Venlafaxine XR 150 mg    05/15/2023   10:39 AM 11/14/2022    1:34 PM 11/14/2022  1:33 PM  Depression screen PHQ 2/9  Decreased Interest 0 0 0  Down, Depressed, Hopeless 0 0 0  PHQ - 2 Score 0 0 0  Altered sleeping 0 0 0  Tired, decreased energy 0 0 0  Change in appetite 0 0 0  Feeling bad or failure about yourself  0 0 0  Trouble concentrating 0 0 0  Moving slowly or fidgety/restless 0 0 0  Suicidal thoughts 0 0 0  PHQ-9 Score 0 0 0  Difficult doing work/chores Not difficult at all Not difficult at all   A/P: full remission- continue current medications    #overactive bladder S: Medication: Myrbetriq 25 mg increased to 50 mg on August 11, 2019 due to worsening issues. wears adult pull ups  -UA and cultures have been reassuring.  Caffeine could contribute but she does not want to cut down  A/P:  overall stable- not perfect but tolerable    # GERD S:Pepcid 20Mg  twice a day. Occasional breakthrough-recommended trial Tums  if needed - sparing use A/P: doing well- continue current medications    % Osteopenia - recheck in 2-3 years so 12/2023 or later- likely next visit  #Knee pain- tylenol each morning but plans to try second dose later in the day  -also some hip pain  #macular degeneration- sees Dr. Jimmey Ralph and vision issues from prior subretinal hemorrhage  Recommended follow up: Return in  about 6 months (around 11/15/2023) for followup or sooner if needed.Schedule b4 you leave. No future appointments.  Lab/Order associations:   ICD-10-CM   1. Stage 3b chronic kidney disease (HCC) Chronic N18.32     2. Late onset Alzheimer's dementia without behavioral disturbance, psychotic disturbance, mood disturbance, or anxiety, unspecified dementia severity (HCC) Chronic G30.1    F02.80     3. Paroxysmal atrial fibrillation (HCC) Chronic I48.0     4. Chronic deep vein thrombosis (DVT) of other vein of right lower extremity (HCC)  I82.591     5. Hypothyroidism, unspecified type  E03.9 TSH    6. Hyperlipidemia, unspecified hyperlipidemia type  E78.5 Comprehensive metabolic panel    CBC with Differential/Platelet    Lipid panel    7. Essential hypertension  I10 Comprehensive metabolic panel    CBC with Differential/Platelet    Lipid panel    8. Major depressive disorder with single episode, in full remission (HCC)  F32.5       No orders of the defined types were placed in this encounter.   Return precautions advised.  Tana Conch, MD

## 2023-05-15 NOTE — Addendum Note (Signed)
 Addended by: Shelva Majestic on: 05/15/2023 02:51 PM   Modules accepted: Level of Service

## 2023-05-15 NOTE — Patient Instructions (Addendum)
 Health Maintenance Due  Topic Date Due   Medicare Annual Wellness (AWV)  12/15/2019  You are eligible to schedule your annual wellness visit with our nurse specialist Inetta Fermo.  Please consider scheduling this before you leave today  Schedule a lab visit at the check out desk within 2 weeks. Return for future fasting labs meaning nothing but water after midnight please. Ok to take your medications with water.   Recommended follow up: Return in about 6 months (around 11/15/2023) for followup or sooner if needed.Schedule b4 you leave.

## 2023-06-25 ENCOUNTER — Encounter: Payer: Self-pay | Admitting: Family Medicine

## 2023-06-25 ENCOUNTER — Other Ambulatory Visit (INDEPENDENT_AMBULATORY_CARE_PROVIDER_SITE_OTHER)

## 2023-06-25 DIAGNOSIS — I10 Essential (primary) hypertension: Secondary | ICD-10-CM | POA: Diagnosis not present

## 2023-06-25 DIAGNOSIS — E785 Hyperlipidemia, unspecified: Secondary | ICD-10-CM

## 2023-06-25 DIAGNOSIS — E039 Hypothyroidism, unspecified: Secondary | ICD-10-CM | POA: Diagnosis not present

## 2023-06-25 LAB — LIPID PANEL
Cholesterol: 168 mg/dL (ref 0–200)
HDL: 38.8 mg/dL — ABNORMAL LOW (ref >39.00)
LDL Cholesterol: 72 mg/dL (ref 0–99)
NonHDL: 129.03
Total CHOL/HDL Ratio: 4
Triglycerides: 283 mg/dL — ABNORMAL HIGH (ref 0.0–149.0)
VLDL: 56.6 mg/dL — ABNORMAL HIGH (ref 0.0–40.0)

## 2023-06-25 LAB — CBC WITH DIFFERENTIAL/PLATELET
Basophils Absolute: 0 10*3/uL (ref 0.0–0.1)
Basophils Relative: 0.6 % (ref 0.0–3.0)
Eosinophils Absolute: 0.2 10*3/uL (ref 0.0–0.7)
Eosinophils Relative: 3 % (ref 0.0–5.0)
HCT: 40.4 % (ref 36.0–46.0)
Hemoglobin: 13.7 g/dL (ref 12.0–15.0)
Lymphocytes Relative: 21.6 % (ref 12.0–46.0)
Lymphs Abs: 1.5 10*3/uL (ref 0.7–4.0)
MCHC: 34 g/dL (ref 30.0–36.0)
MCV: 90.7 fl (ref 78.0–100.0)
Monocytes Absolute: 0.6 10*3/uL (ref 0.1–1.0)
Monocytes Relative: 9.2 % (ref 3.0–12.0)
Neutro Abs: 4.4 10*3/uL (ref 1.4–7.7)
Neutrophils Relative %: 65.6 % (ref 43.0–77.0)
Platelets: 261 10*3/uL (ref 150.0–400.0)
RBC: 4.45 Mil/uL (ref 3.87–5.11)
RDW: 13.8 % (ref 11.5–15.5)
WBC: 6.7 10*3/uL (ref 4.0–10.5)

## 2023-06-25 LAB — COMPREHENSIVE METABOLIC PANEL WITH GFR
ALT: 14 U/L (ref 0–35)
AST: 20 U/L (ref 0–37)
Albumin: 4.9 g/dL (ref 3.5–5.2)
Alkaline Phosphatase: 79 U/L (ref 39–117)
BUN: 18 mg/dL (ref 6–23)
CO2: 26 meq/L (ref 19–32)
Calcium: 10.1 mg/dL (ref 8.4–10.5)
Chloride: 102 meq/L (ref 96–112)
Creatinine, Ser: 1.2 mg/dL (ref 0.40–1.20)
GFR: 41.54 mL/min — ABNORMAL LOW (ref >60.00)
Glucose, Bld: 107 mg/dL — ABNORMAL HIGH (ref 70–99)
Potassium: 4.4 meq/L (ref 3.5–5.1)
Sodium: 138 meq/L (ref 135–145)
Total Bilirubin: 0.5 mg/dL (ref 0.2–1.2)
Total Protein: 8.2 g/dL (ref 6.0–8.3)

## 2023-06-25 LAB — TSH: TSH: 4.29 u[IU]/mL (ref 0.35–5.50)

## 2023-07-16 DIAGNOSIS — H16223 Keratoconjunctivitis sicca, not specified as Sjogren's, bilateral: Secondary | ICD-10-CM | POA: Diagnosis not present

## 2023-07-16 DIAGNOSIS — Z961 Presence of intraocular lens: Secondary | ICD-10-CM | POA: Diagnosis not present

## 2023-07-16 DIAGNOSIS — H40013 Open angle with borderline findings, low risk, bilateral: Secondary | ICD-10-CM | POA: Diagnosis not present

## 2023-07-16 DIAGNOSIS — H353223 Exudative age-related macular degeneration, left eye, with inactive scar: Secondary | ICD-10-CM | POA: Diagnosis not present

## 2023-07-16 DIAGNOSIS — H5211 Myopia, right eye: Secondary | ICD-10-CM | POA: Diagnosis not present

## 2023-07-16 DIAGNOSIS — H524 Presbyopia: Secondary | ICD-10-CM | POA: Diagnosis not present

## 2023-07-16 DIAGNOSIS — H52222 Regular astigmatism, left eye: Secondary | ICD-10-CM | POA: Diagnosis not present

## 2023-07-26 ENCOUNTER — Other Ambulatory Visit: Payer: Self-pay | Admitting: Family Medicine

## 2023-08-26 ENCOUNTER — Other Ambulatory Visit: Payer: Self-pay | Admitting: Family Medicine

## 2023-10-20 ENCOUNTER — Other Ambulatory Visit: Payer: Self-pay | Admitting: Family Medicine

## 2023-11-05 ENCOUNTER — Ambulatory Visit: Payer: Self-pay

## 2023-11-05 NOTE — Telephone Encounter (Signed)
 FYI Only or Action Required?: Action required by provider: please review note.  Patient was last seen in primary care on 05/15/2023 by Katrinka Garnette KIDD, MD.  Called Nurse Triage reporting upper back pain that shoots to both arms.  Symptoms began a week ago.  Interventions attempted: OTC medications: acetaminophen  .  Symptoms are: unchanged.  Triage Disposition: See PCP When Office is Open (Within 3 Days)  Patient/caregiver understands and will follow disposition?: Yes  Please ask Dr Katrinka to review chart to make sure correct disposition according to location of issue         Copied from CRM [id number]. Topic: Clinical - Red Word Triage >> Nov 05, 2023 10:19 AM [place] J wrote: [facility] that prompted transfer to Nurse Triage: Patient is having tightness and pain in her back that is radiating down her arms. Reason for Disposition  [1] MODERATE back pain (e.g., interferes with normal activities) AND [2] present > 3 days  Answer Assessment - Initial Assessment Questions 1. ONSET: When did the pain begin? (e.g., minutes, hours, days)     3 days started with walking 2. LOCATION: Where does it hurt? (upper, mid or lower back)     Mid back, neck both arms   3. SEVERITY: How bad is the pain?  (e.g., Scale 1-10; mild, moderate, or severe)     Soreness//7/10 woke her in the night  4. PATTERN: Is the pain constant? (e.g., yes, no; constant, intermittent)      no 5. RADIATION: Does the pain shoot into your legs or somewhere else?     Both arms  6. CAUSE:  What do you think is causing the back pain?      unsure 7. BACK OVERUSE:  Any recent lifting of heavy objects, strenuous work or exercise?     no 8. MEDICINES: What have you taken so far for the pain? (e.g., nothing, acetaminophen , NSAIDS)     *No Answer* 9. NEUROLOGIC SYMPTOMS: Do you have any weakness, numbness, or problems with bowel/bladder control?     Dizziness  10. OTHER SYMPTOMS: Do you have any other  symptoms? (e.g., fever, abdomen pain, burning with urination, blood in urine)      Feels like muscular pain  Protocols used: Back Pain-A-AH

## 2023-11-06 ENCOUNTER — Encounter: Payer: Self-pay | Admitting: Family Medicine

## 2023-11-06 ENCOUNTER — Ambulatory Visit (INDEPENDENT_AMBULATORY_CARE_PROVIDER_SITE_OTHER): Admitting: Family Medicine

## 2023-11-06 VITALS — BP 116/70 | HR 68 | Temp 97.2°F | Ht 67.0 in | Wt 155.6 lb

## 2023-11-06 DIAGNOSIS — M79603 Pain in arm, unspecified: Secondary | ICD-10-CM

## 2023-11-06 DIAGNOSIS — I480 Paroxysmal atrial fibrillation: Secondary | ICD-10-CM

## 2023-11-06 DIAGNOSIS — I10 Essential (primary) hypertension: Secondary | ICD-10-CM | POA: Diagnosis not present

## 2023-11-06 DIAGNOSIS — M549 Dorsalgia, unspecified: Secondary | ICD-10-CM | POA: Diagnosis not present

## 2023-11-06 DIAGNOSIS — M79604 Pain in right leg: Secondary | ICD-10-CM

## 2023-11-06 NOTE — Patient Instructions (Signed)
 It was very nice to see you today!  VISIT SUMMARY: You visited us  today due to upper back pain radiating to both arms. We discussed your symptoms, and after evaluation, we have a plan to help manage your pain and address your other health concerns.  YOUR PLAN: MUSCLE SPASM OF UPPER BACK AND BOTH ARMS: You are experiencing muscle spasms likely due to arthritis and muscle irritation. -Use a heating pad for relief. -Increase Tylenol  to two tablets if needed. -Consider physical therapy if symptoms persist. -We may consider muscle relaxers if there is no improvement.  CHRONIC DEEP VEIN THROMBOSIS OF LOWER EXTREMITY WITH SCAR TISSUE: You have a history of chronic DVT with scar tissue, and recent calf soreness does not indicate a new DVT. -We will order an ultrasound of the lower extremity if symptoms persist. -Discuss the risks and benefits of anticoagulation therapy with your primary care provider.  ARTHRITIS OF CERVICAL AND UPPER THORACIC SPINE: Arthritis in your cervical and upper thoracic spine is causing muscle spasms and discomfort. -Use a heating pad for relief. -Consider physical therapy if needed.  Return if symptoms worsen or fail to improve.   Take care, Dr Kennyth  PLEASE NOTE:  If you had any lab tests, please let us  know if you have not heard back within a few days. You may see your results on mychart before we have a chance to review them but we will give you a call once they are reviewed by us .   If we ordered any referrals today, please let us  know if you have not heard from their office within the next week.   If you had any urgent prescriptions sent in today, please check with the pharmacy within an hour of our visit to make sure the prescription was transmitted appropriately.   Please try these tips to maintain a healthy lifestyle:  Eat at least 3 REAL meals and 1-2 snacks per day.  Aim for no more than 5 hours between eating.  If you eat breakfast, please do so within  one hour of getting up.   Each meal should contain half fruits/vegetables, one quarter protein, and one quarter carbs (no bigger than a computer mouse)  Cut down on sweet beverages. This includes juice, soda, and sweet tea.   Drink at least 1 glass of water with each meal and aim for at least 8 glasses per day  Exercise at least 150 minutes every week.

## 2023-11-06 NOTE — Progress Notes (Signed)
 Laura Li is a 83 y.o. female who presents today for an office visit.  Assessment/Plan:  New/Acute Problems: Upper Back / Arm Pain Symptoms have resolved.  Overall reassuring exam.  Based on history sounds like she is likely having intermittent muscular spasm and potentially some mild cervical radiculopathy.  She did have a CT scan performed a few years ago in the ED after a fall which showed degenerative changes in her cervical spine.  We did discuss further evaluation for this including referral to orthopedics or sports medicine and imaging however given improvement in symptoms since yesterday they would like to hold off on this for now.  There was some concern about potential cardiac etiology.  Based on history of this is unlikely and reassuring that her symptoms have resolvedit   Her EKG today shows NSR and right bundle branch block consistent with previous EKGs.  No signs of acute ischemia..  They will let us  know if she has any recurrence of symptoms and would consider referral versus trial of muscle relaxer at that time.  Right Calf Pain Potentially musculoskeletal though does have known history of chronic DVT in her right calf.  She had to stop anticoagulation due to a retinal hemorrhage previously.  She is not currently on anticoagulation.  They are not overly concerned with this at this point however we did discuss obtaining an ultrasound to rule out acute DVT.  We will order this today.  Essential Hypertension  On amlodipine  5 mg daily and nadolol  20 mg daily.  At goal today.  Paroxysmal A-fib Regular rate and rhythm today.  Not on anticoagulation due to history of retinal hemorrhage.    Subjective:  HPI:  See assessment / plan for status of chronic conditions.   Discussed the use of AI scribe software for clinical note transcription with the patient, who gave verbal consent to proceed.  History of Present Illness Laura Li is an 83 year old female who  presents with upper back pain radiating to both arms.  She has been experiencing upper back pain radiating down her left arm and later involving both arms. The pain began a few days ago, lasting for about three days, with symptoms coming and going. She describes the sensation as soreness extending down to her hands. Although the pain has subsided, she still feels soreness when moving. No associated symptoms such as lightheadedness, shortness of breath, nausea, or chest pain were noted. She has not experienced similar symptoms in the past, and there was no identifiable trigger for the onset of her symptoms. The pain was persistent for a few days but has improved.  Pain is essentially resolved at this point.  No numbness or tingling.  She takes one Tylenol  daily, primarily for knee pain, and did not try any additional medications for the back and arm pain. She mentioned that the pain occurred after increased physical activity, such as walking more than usual. She lives in a place where she has to walk to her meals, which provides regular physical activity.  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 She recently had a Doppler ultrasound of her leg, which showed scar tissue. She also mentioned calf soreness that started recently, with a history of chronic DVT in the past.         Objective:  Physical Exam: BP 116/70   Pulse  68   Temp (!) 97.2 F (36.2 C) (Temporal)   Ht 5' 7 (1.702 m)   Wt 155 lb 9.6 oz (70.6 kg)   SpO2 94%   BMI 24.37 kg/m   Gen: No acute distress, resting comfortably CV: Regular rate and rhythm with no murmurs appreciated Pulm: Normal work of breathing, clear to auscultation bilaterally with no crackles, wheezes, or rhonchi MUSCULOSKELETAL - Back: No deformities.  Nontender to palpation - Arms: No deformities.  Full range of motion throughout.   Sensation light touch intact throughout.  Neurovascular intact distally Neuro: Grossly normal, moves all extremities Psych: Normal affect and thought content  EKG: Normal sinus rhythm.  Right bundle branch block consistent with previous EKGs  Time Spent: 45 minutes of total time was spent on the date of the encounter performing the following actions: chart review prior to seeing the patient, obtaining history, performing a medically necessary exam, counseling on the treatment plan, placing orders, and documenting in our EHR.  This does not include time spent interpreting above EKG.       Worth HERO. Kennyth, MD 11/06/2023 11:10 AM

## 2023-11-13 NOTE — Telephone Encounter (Signed)
 Patient scheduled to see Dr. Katrinka 11/25/2023.

## 2023-11-13 NOTE — Telephone Encounter (Signed)
 Looks like we have a 120 opening on Friday if we can get her in that quickly possibly

## 2023-11-14 NOTE — Telephone Encounter (Signed)
 Spoke with patients daughter. Patient does not need to be seen for this issue now. Will be keeping appointment on 11/25/2023.

## 2023-11-17 ENCOUNTER — Other Ambulatory Visit: Payer: Self-pay | Admitting: Family Medicine

## 2023-11-25 ENCOUNTER — Encounter: Payer: Self-pay | Admitting: Family Medicine

## 2023-11-25 ENCOUNTER — Ambulatory Visit (INDEPENDENT_AMBULATORY_CARE_PROVIDER_SITE_OTHER): Admitting: Family Medicine

## 2023-11-25 VITALS — BP 128/72 | HR 68 | Temp 97.4°F | Ht 67.0 in | Wt 156.0 lb

## 2023-11-25 DIAGNOSIS — E785 Hyperlipidemia, unspecified: Secondary | ICD-10-CM | POA: Diagnosis not present

## 2023-11-25 DIAGNOSIS — N1832 Chronic kidney disease, stage 3b: Secondary | ICD-10-CM

## 2023-11-25 DIAGNOSIS — I10 Essential (primary) hypertension: Secondary | ICD-10-CM

## 2023-11-25 DIAGNOSIS — E039 Hypothyroidism, unspecified: Secondary | ICD-10-CM

## 2023-11-25 DIAGNOSIS — I480 Paroxysmal atrial fibrillation: Secondary | ICD-10-CM

## 2023-11-25 DIAGNOSIS — Z23 Encounter for immunization: Secondary | ICD-10-CM | POA: Diagnosis not present

## 2023-11-25 NOTE — Addendum Note (Signed)
 Addended by: KATRINKA GARNETTE KIDD on: 11/25/2023 07:40 PM   Modules accepted: Level of Service

## 2023-11-25 NOTE — Addendum Note (Signed)
 Addended by: Anthoney Sheppard on: 11/25/2023 11:20 AM   Modules accepted: Orders

## 2023-11-25 NOTE — Patient Instructions (Addendum)
 Opted for labs next visit  Flu shot today  Recommended follow up: Return in about 6 months (around 05/24/2024) for followup or sooner if needed.Schedule b4 you leave.

## 2023-11-25 NOTE — Progress Notes (Signed)
 Phone [phone] In person visit   Subjective:   Laura Li is a 83 y.o. year old very pleasant female patient who presents for/with See problem oriented charting Chief Complaint  Patient presents with   Chronic Kidney Disease   Hypertension   Past Medical History-  Patient Active Problem List   Diagnosis Date Noted   Dementia (HCC) 10/16/2016    Priority: High   Paroxysmal atrial fibrillation (HCC) 06/04/2016    Priority: High   Chronic deep vein thrombosis (DVT) (HCC) 09/01/2015    Priority: High   History of left breast cancer 04/24/2014    Priority: High   History of right breast cancer 04/24/2014    Priority: High   Major depressive disorder with single episode, in full remission (HCC)     Priority: Medium    Overactive bladder 09/19/2016    Priority: Medium    Acute lower UTI 05/29/2016    Priority: Medium    Osteopenia 05/23/2016    Priority: Medium    History of pneumothorax 06/15/2014    Priority: Medium    Chronic kidney disease, stage III (moderate) (HCC) 12/09/2012    Priority: Medium    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    Priority: Medium    History of vasculitis 11/09/2012    Priority: Medium    Hypothyroidism 02/06/2006    Priority: Medium    Hyperlipidemia 02/06/2006    Priority: Medium    Essential hypertension 02/06/2006    Priority: Medium    Localized swelling, mass, or lump of lower extremity 11/05/2014    Priority: Low   Diverticulitis 08/09/2014    Priority: Low   Family history of breast cancer 04/29/2014    Priority: Low   GERD (gastroesophageal reflux disease) 03/31/2014    Priority: Low   Hot flashes 03/31/2014    Priority: Low   Osteoarthritis 03/31/2014    Priority: Low   Allergic rhinitis 02/06/2006    Priority: Low   Stage 3b chronic kidney disease (HCC) 05/15/2023   Disorder of bone and cartilage 05/23/2017    Medications- reviewed and updated Current Outpatient Medications  Medication Sig Dispense  Refill   acetaminophen  (TYLENOL ) 500 MG tablet Take 500 mg by mouth daily as needed for mild pain.     amLODipine  (NORVASC ) 5 MG tablet TAKE 1 TABLET(5 MG) BY MOUTH DAILY 90 tablet 3   B Complex-C-E-Zn (B COMPLEX-C-E-ZINC) tablet Take 1 tablet by mouth daily.     Calcium -Vitamin D  (CALTRATE 600 PLUS-VIT D PO) Take 1 tablet by mouth 2 (two) times daily.     carboxymethylcellulose (REFRESH PLUS) 0.5 % SOLN 2 drops 3 (three) times daily as needed (dry eyes).     famotidine  (PEPCID ) 20 MG tablet Take 1 tablet (20 mg total) by mouth 2 (two) times daily. 60 tablet 5   Lactobacillus-Inulin (CULTURELLE DIGESTIVE HEALTH PO) Take 1 capsule by mouth daily.     levothyroxine  (SYNTHROID ) 88 MCG tablet TAKE 1 TABLET(88 MCG) BY MOUTH DAILY 30 tablet 3   Multiple Vitamins-Minerals (CENTRUM SILVER PO) Take 1 tablet by mouth daily.     MYRBETRIQ  25 MG TB24 tablet TAKE 1 TABLET(25 MG) BY MOUTH DAILY 30 tablet 5   nadolol  (CORGARD ) 20 MG tablet TAKE 1 TABLET BY MOUTH EVERY DAY 30 tablet 0   rosuvastatin  (CRESTOR ) 10 MG tablet TAKE 1 TABLET(10 MG) BY MOUTH DAILY 90 tablet 3   venlafaxine  XR (EFFEXOR -XR) 150 MG 24 hr capsule TAKE 1 CAPSULE(150 MG) BY MOUTH DAILY 90 capsule  3   vitamin B-12 (CYANOCOBALAMIN ) 100 MCG tablet Take 100 mcg by mouth daily.     No current facility-administered medications for this visit.     Objective:  BP 128/72 (BP Location: Left Arm, Patient Position: Sitting, Cuff Size: Normal)   Pulse 68   Temp (!) 97.4 F (36.3 C) (Temporal)   Ht 5' 7 (1.702 m)   Wt 156 lb (70.8 kg)   SpO2 96%   BMI 24.43 kg/m  Gen: NAD, resting comfortably CV: RRR no murmurs rubs or gallops Lungs: CTAB no crackles, wheeze, rhonchi Abdomen: soft/nontender/nondistended/normal bowel sounds. No rebound or guarding.  Ext: no edema Skin: warm, dry Neuro: walks with cane- looks to daughter of ranswers     Assessment and Plan   % Dementia -PCP only.  prior followed Dr. Lionell office. aricept  felt  itchy. namenda  trial 2022 but led to agitation and discontinued- ongoing gradual decline   % History of both right and left breast cancer followed by Dr. Layla - released from his care. we have opted to discontinue mammograms unless has new symptoms.  - left breast tenderness laterally - has not been mentioned to daughter- offered exam and mammogram but prefers to hold off unless persistent concerns  # Atrial Fibrillation-paroxysmal #Chronic DVT-DVT around 2000 and chronic DVT was noted November 04, 2014. No increased leg swelling lately or calf pain S: Rate Controlled with Nadolol  20mg   Anticoagulated with Xarelto  Previously- She Stopped Xarelto  after Subretinal Hemorrhage and Has Decided so Far Not to Restart as of June 2021  Chadsvasc Score of 4  A/P: a fib appropriately anticoagulated and rate controlled- continue current medicine  No recurrence of DVT   #hyperlipidemia-considering more intense control if any evidence of stroke on MRI- eventually she/daughter opted out of this  S: Medication: rosuvastatin  10 mg  Lab Results  Component Value Date   CHOL 168 06/25/2023   HDL 38.80 (L) 06/25/2023   LDLCALC 72 06/25/2023   LDLDIRECT 82.0 05/09/2022   TRIG 283.0 (H) 06/25/2023   CHOLHDL 4 06/25/2023   A/P: LDL close to ideal goal- triglyceride(s) above goal but we are tolerating.    #hypothyroidism S: compliant On thyroid  medication-Levothyroxine  Lab Results  Component Value Date   TSH 4.29 06/25/2023   A/P: hopefully stable- update TSH next visit. Continue current meds for now    #hypertension S: medication: Nadolol  20 mg tablet, amlodipine  5 mg  BP Readings from Last 3 Encounters:  11/25/23 128/72  11/06/23 116/70  05/15/23 138/80  A/P: stable- continue current medicines    #Chronic kidney disease stage III S: GFR is typically in the 50s range in past- more 40's lately  -Patient knows to avoid NSAIDs   A/P: she prefers annual checks only- continue current  medications - tylenol  only thankfully   # Depression S: Medication: Venlafaxine  XR 150MG      11/25/2023   10:32 AM 11/06/2023   10:13 AM 11/06/2023   10:10 AM  Depression screen PHQ 2/9  Decreased Interest 0 0 0  Down, Depressed, Hopeless 0 0 0  PHQ - 2 Score 0 0 0  Altered sleeping 1 0   Tired, decreased energy 1 0   Change in appetite 0 0   Feeling bad or failure about yourself  0 0   Trouble concentrating 0 0   Moving slowly or fidgety/restless 0 0   Suicidal thoughts 0 0   PHQ-9 Score 2 0   Difficult doing work/chores Not difficult at all Not difficult  at all   A/P: full remission- continue current medications . Good stable dose   #overactive bladder S: Medication: Myrbetriq  25 mg increased to 50 mg on August 11, 2019 due to worsening issues. wears adult pull ups  -UA and cultures have been reassuring.  Caffeine could contribute but she does not want to cut down  A/P:  overactive bladder overall stable- continue to monitor     # GERD S:Pepcid  20Mg  twice a day. Occasional breakthrough-recommended trial Tums   -Prefer to avoid PPI if possible due to kidney and memory risks A/P: doing well continue current medications    % Osteopenia-Dr. Trixie last visit March 2019-due to medicine sensitivity- prefers to avoid repeat and holding off on options like Prolia/reclast   #Knee pain- tylenol  each morning but plans to try second dose later in the day   #macular degeneration- sees Dr. Kennyth  Recommended follow up: Return in about 6 months (around 05/24/2024) for followup or sooner if needed.Schedule b4 you leave.  Lab/Order associations:   ICD-10-CM   1. Paroxysmal atrial fibrillation (HCC)  I48.0     2. Essential hypertension  I10     3. Hypothyroidism, unspecified type  E03.9     4. Stage 3b chronic kidney disease (HCC)  N18.32     5. Hyperlipidemia, unspecified hyperlipidemia type  E78.5      No orders of the defined types were placed in this encounter.   Return  precautions advised.  Garnette Lukes, MD

## 2023-12-24 ENCOUNTER — Other Ambulatory Visit: Payer: Self-pay | Admitting: Family Medicine

## 2023-12-24 DIAGNOSIS — L820 Inflamed seborrheic keratosis: Secondary | ICD-10-CM | POA: Diagnosis not present

## 2023-12-24 DIAGNOSIS — Z85820 Personal history of malignant melanoma of skin: Secondary | ICD-10-CM | POA: Diagnosis not present

## 2023-12-24 DIAGNOSIS — L905 Scar conditions and fibrosis of skin: Secondary | ICD-10-CM | POA: Diagnosis not present

## 2023-12-24 DIAGNOSIS — L821 Other seborrheic keratosis: Secondary | ICD-10-CM | POA: Diagnosis not present

## 2023-12-24 DIAGNOSIS — L570 Actinic keratosis: Secondary | ICD-10-CM | POA: Diagnosis not present

## 2024-01-04 ENCOUNTER — Encounter: Payer: Self-pay | Admitting: Family Medicine

## 2024-01-05 ENCOUNTER — Other Ambulatory Visit: Payer: Self-pay

## 2024-01-05 MED ORDER — NADOLOL 20 MG PO TABS: 20.0000 mg | ORAL_TABLET | Freq: Every day | ORAL | 0 refills | Status: DC

## 2024-01-19 DIAGNOSIS — H16223 Keratoconjunctivitis sicca, not specified as Sjogren's, bilateral: Secondary | ICD-10-CM | POA: Diagnosis not present

## 2024-01-19 DIAGNOSIS — Z961 Presence of intraocular lens: Secondary | ICD-10-CM | POA: Diagnosis not present

## 2024-01-19 DIAGNOSIS — H353223 Exudative age-related macular degeneration, left eye, with inactive scar: Secondary | ICD-10-CM | POA: Diagnosis not present

## 2024-01-19 DIAGNOSIS — H35033 Hypertensive retinopathy, bilateral: Secondary | ICD-10-CM | POA: Diagnosis not present

## 2024-02-20 ENCOUNTER — Other Ambulatory Visit: Payer: Self-pay | Admitting: Family Medicine

## 2024-03-15 ENCOUNTER — Other Ambulatory Visit: Payer: Self-pay | Admitting: Family Medicine

## 2024-03-31 ENCOUNTER — Other Ambulatory Visit: Payer: Self-pay | Admitting: Family Medicine

## 2024-04-12 ENCOUNTER — Other Ambulatory Visit: Payer: Self-pay | Admitting: Family Medicine

## 2024-04-14 ENCOUNTER — Other Ambulatory Visit: Payer: Self-pay | Admitting: Family Medicine

## 2024-05-25 ENCOUNTER — Ambulatory Visit: Admitting: Family Medicine
